# Patient Record
Sex: Male | Born: 1935 | Race: White | Hispanic: No | Marital: Married | Smoking: Former smoker
Health system: Southern US, Community
[De-identification: ages and names within clinical notes are randomized; demographics above are authoritative.]

## PROBLEM LIST (undated history)

## (undated) DIAGNOSIS — I639 Cerebral infarction, unspecified: Secondary | ICD-10-CM

## (undated) DIAGNOSIS — I509 Heart failure, unspecified: Secondary | ICD-10-CM

## (undated) DIAGNOSIS — M1990 Unspecified osteoarthritis, unspecified site: Secondary | ICD-10-CM

## (undated) DIAGNOSIS — I824Z9 Acute embolism and thrombosis of unspecified deep veins of unspecified distal lower extremity: Secondary | ICD-10-CM

## (undated) DIAGNOSIS — I10 Essential (primary) hypertension: Secondary | ICD-10-CM

## (undated) DIAGNOSIS — I209 Angina pectoris, unspecified: Secondary | ICD-10-CM

## (undated) DIAGNOSIS — I200 Unstable angina: Secondary | ICD-10-CM

## (undated) DIAGNOSIS — I2510 Atherosclerotic heart disease of native coronary artery without angina pectoris: Secondary | ICD-10-CM

## (undated) DIAGNOSIS — E785 Hyperlipidemia, unspecified: Secondary | ICD-10-CM

## (undated) HISTORY — PX: APPENDECTOMY: SHX54

## (undated) HISTORY — PX: TONSILLECTOMY: SUR1361

## (undated) HISTORY — DX: Acute embolism and thrombosis of unspecified deep veins of unspecified distal lower extremity: I82.4Z9

## (undated) HISTORY — PX: FOOT SURGERY: SHX648

## (undated) HISTORY — PX: SHOULDER SURGERY: SHX246

## (undated) HISTORY — DX: Unspecified osteoarthritis, unspecified site: M19.90

## (undated) HISTORY — DX: Hyperlipidemia, unspecified: E78.5

## (undated) HISTORY — DX: Cerebral infarction, unspecified: I63.9

---

## 2005-03-26 ENCOUNTER — Encounter (INDEPENDENT_AMBULATORY_CARE_PROVIDER_SITE_OTHER): Payer: Self-pay | Admitting: Internal Medicine

## 2006-01-14 ENCOUNTER — Encounter (INDEPENDENT_AMBULATORY_CARE_PROVIDER_SITE_OTHER): Payer: Self-pay | Admitting: Internal Medicine

## 2006-01-16 ENCOUNTER — Emergency Department (HOSPITAL_COMMUNITY): Admission: EM | Admit: 2006-01-16 | Discharge: 2006-01-16 | Payer: Self-pay | Admitting: Emergency Medicine

## 2006-02-09 ENCOUNTER — Ambulatory Visit: Payer: Self-pay | Admitting: Internal Medicine

## 2006-03-09 ENCOUNTER — Ambulatory Visit: Payer: Self-pay | Admitting: Internal Medicine

## 2006-03-12 ENCOUNTER — Encounter (INDEPENDENT_AMBULATORY_CARE_PROVIDER_SITE_OTHER): Payer: Self-pay | Admitting: Internal Medicine

## 2006-03-30 ENCOUNTER — Ambulatory Visit: Payer: Self-pay | Admitting: Internal Medicine

## 2006-04-13 ENCOUNTER — Ambulatory Visit: Payer: Self-pay | Admitting: Internal Medicine

## 2006-04-20 ENCOUNTER — Telehealth (INDEPENDENT_AMBULATORY_CARE_PROVIDER_SITE_OTHER): Payer: Self-pay | Admitting: Internal Medicine

## 2006-04-22 ENCOUNTER — Encounter (INDEPENDENT_AMBULATORY_CARE_PROVIDER_SITE_OTHER): Payer: Self-pay | Admitting: Internal Medicine

## 2006-05-12 ENCOUNTER — Ambulatory Visit: Payer: Self-pay | Admitting: Internal Medicine

## 2006-06-14 ENCOUNTER — Encounter (INDEPENDENT_AMBULATORY_CARE_PROVIDER_SITE_OTHER): Payer: Self-pay | Admitting: Internal Medicine

## 2006-08-02 ENCOUNTER — Ambulatory Visit: Payer: Self-pay | Admitting: Internal Medicine

## 2006-08-02 LAB — CONVERTED CEMR LAB: Hgb A1c MFr Bld: 7.1 %

## 2006-08-03 ENCOUNTER — Encounter (INDEPENDENT_AMBULATORY_CARE_PROVIDER_SITE_OTHER): Payer: Self-pay | Admitting: Internal Medicine

## 2006-08-03 LAB — CONVERTED CEMR LAB
AST: 23 units/L (ref 0–37)
Albumin: 4.6 g/dL (ref 3.5–5.2)
BUN: 25 mg/dL — ABNORMAL HIGH (ref 6–23)
CO2: 25 meq/L (ref 19–32)
Calcium: 9.7 mg/dL (ref 8.4–10.5)
Chloride: 105 meq/L (ref 96–112)
Cholesterol: 171 mg/dL (ref 0–200)
Creatinine, Ser: 0.97 mg/dL (ref 0.40–1.50)
HDL: 63 mg/dL (ref >39)
Lymphocytes Relative: 29 % (ref 12–46)
Lymphs Abs: 1.6 10*3/uL (ref 0.7–3.3)
MCV: 92 fL (ref 78.0–100.0)
Monocytes Relative: 9 % (ref 3–11)
Neutro Abs: 3 10*3/uL (ref 1.7–7.7)
Neutrophils Relative %: 56 % (ref 43–77)
Platelets: 221 10*3/uL (ref 150–400)
Potassium: 4.9 meq/L (ref 3.5–5.3)
RBC count: 4.4 10*6/uL
RBC: 4.4 M/uL (ref 4.22–5.81)
Total CHOL/HDL Ratio: 2.7
WBC: 5.4 10*3/uL (ref 4.0–10.5)

## 2006-08-04 ENCOUNTER — Encounter: Payer: Self-pay | Admitting: Internal Medicine

## 2006-08-04 DIAGNOSIS — E785 Hyperlipidemia, unspecified: Secondary | ICD-10-CM

## 2006-08-04 DIAGNOSIS — I10 Essential (primary) hypertension: Secondary | ICD-10-CM

## 2006-08-04 DIAGNOSIS — M1990 Unspecified osteoarthritis, unspecified site: Secondary | ICD-10-CM | POA: Insufficient documentation

## 2006-09-20 ENCOUNTER — Encounter (INDEPENDENT_AMBULATORY_CARE_PROVIDER_SITE_OTHER): Payer: Self-pay | Admitting: Internal Medicine

## 2006-10-01 ENCOUNTER — Encounter (INDEPENDENT_AMBULATORY_CARE_PROVIDER_SITE_OTHER): Payer: Self-pay | Admitting: Internal Medicine

## 2006-11-01 ENCOUNTER — Ambulatory Visit: Payer: Self-pay | Admitting: Internal Medicine

## 2006-11-01 DIAGNOSIS — F528 Other sexual dysfunction not due to a substance or known physiological condition: Secondary | ICD-10-CM | POA: Insufficient documentation

## 2006-11-03 ENCOUNTER — Encounter (INDEPENDENT_AMBULATORY_CARE_PROVIDER_SITE_OTHER): Payer: Self-pay | Admitting: Internal Medicine

## 2006-11-29 ENCOUNTER — Encounter (INDEPENDENT_AMBULATORY_CARE_PROVIDER_SITE_OTHER): Payer: Self-pay | Admitting: Internal Medicine

## 2006-12-15 ENCOUNTER — Encounter (INDEPENDENT_AMBULATORY_CARE_PROVIDER_SITE_OTHER): Payer: Self-pay | Admitting: Internal Medicine

## 2006-12-17 ENCOUNTER — Encounter (INDEPENDENT_AMBULATORY_CARE_PROVIDER_SITE_OTHER): Payer: Self-pay | Admitting: Internal Medicine

## 2006-12-31 ENCOUNTER — Encounter (INDEPENDENT_AMBULATORY_CARE_PROVIDER_SITE_OTHER): Payer: Self-pay | Admitting: Internal Medicine

## 2007-01-11 ENCOUNTER — Telehealth (INDEPENDENT_AMBULATORY_CARE_PROVIDER_SITE_OTHER): Payer: Self-pay | Admitting: *Deleted

## 2007-01-12 ENCOUNTER — Ambulatory Visit: Payer: Self-pay | Admitting: Internal Medicine

## 2007-01-12 DIAGNOSIS — N390 Urinary tract infection, site not specified: Secondary | ICD-10-CM | POA: Insufficient documentation

## 2007-01-12 DIAGNOSIS — R339 Retention of urine, unspecified: Secondary | ICD-10-CM | POA: Insufficient documentation

## 2007-01-12 LAB — CONVERTED CEMR LAB
Bilirubin Urine: NEGATIVE
Glucose, Urine, Semiquant: NEGATIVE
Specific Gravity, Urine: 1.015
pH: 5.5

## 2007-01-26 ENCOUNTER — Ambulatory Visit: Payer: Self-pay | Admitting: Internal Medicine

## 2007-01-26 DIAGNOSIS — N400 Enlarged prostate without lower urinary tract symptoms: Secondary | ICD-10-CM

## 2007-03-10 ENCOUNTER — Encounter (INDEPENDENT_AMBULATORY_CARE_PROVIDER_SITE_OTHER): Payer: Self-pay | Admitting: Internal Medicine

## 2007-03-28 ENCOUNTER — Encounter (INDEPENDENT_AMBULATORY_CARE_PROVIDER_SITE_OTHER): Payer: Self-pay | Admitting: Internal Medicine

## 2007-05-04 ENCOUNTER — Ambulatory Visit: Payer: Self-pay | Admitting: Internal Medicine

## 2007-05-04 LAB — CONVERTED CEMR LAB
Blood Glucose, Fingerstick: 123
Hgb A1c MFr Bld: 6.7 %

## 2007-05-05 LAB — CONVERTED CEMR LAB
AST: 22 units/L (ref 0–37)
Alkaline Phosphatase: 65 units/L (ref 39–117)
BUN: 21 mg/dL (ref 6–23)
Basophils Absolute: 0 10*3/uL (ref 0.0–0.1)
Basophils Relative: 1 % (ref 0–1)
Calcium: 9.5 mg/dL (ref 8.4–10.5)
Creatinine, Ser: 0.89 mg/dL (ref 0.40–1.50)
Eosinophils Absolute: 0.4 10*3/uL (ref 0.0–0.7)
Eosinophils Relative: 8 % — ABNORMAL HIGH (ref 0–5)
HDL: 62 mg/dL (ref >39)
Hemoglobin: 12.6 g/dL — ABNORMAL LOW (ref 13.0–17.0)
MCHC: 32.7 g/dL (ref 30.0–36.0)
MCV: 90.6 fL (ref 78.0–100.0)
Monocytes Absolute: 0.5 10*3/uL (ref 0.2–0.7)
Monocytes Relative: 12 % — ABNORMAL HIGH (ref 3–11)
PSA: 0.21 ng/mL (ref 0.10–4.00)
RBC: 4.25 M/uL (ref 4.22–5.81)
RDW: 13.6 % (ref 11.5–14.0)
Total Bilirubin: 0.5 mg/dL (ref 0.3–1.2)
Total CHOL/HDL Ratio: 2.6
VLDL: 33 mg/dL (ref 0–40)

## 2007-06-03 ENCOUNTER — Encounter (INDEPENDENT_AMBULATORY_CARE_PROVIDER_SITE_OTHER): Payer: Self-pay | Admitting: Internal Medicine

## 2007-08-03 ENCOUNTER — Ambulatory Visit: Payer: Self-pay | Admitting: Internal Medicine

## 2007-08-04 ENCOUNTER — Telehealth (INDEPENDENT_AMBULATORY_CARE_PROVIDER_SITE_OTHER): Payer: Self-pay | Admitting: *Deleted

## 2007-08-04 LAB — CONVERTED CEMR LAB
AST: 22 units/L (ref 0–37)
Alkaline Phosphatase: 72 units/L (ref 39–117)
BUN: 18 mg/dL (ref 6–23)
Basophils Absolute: 0 10*3/uL (ref 0.0–0.1)
Basophils Relative: 1 % (ref 0–1)
Creatinine, Ser: 0.91 mg/dL (ref 0.40–1.50)
Eosinophils Absolute: 0.4 10*3/uL (ref 0.0–0.7)
HDL: 59 mg/dL (ref >39)
LDL Cholesterol: 72 mg/dL (ref 0–99)
MCHC: 32.7 g/dL (ref 30.0–36.0)
MCV: 91.5 fL (ref 78.0–100.0)
Monocytes Absolute: 0.5 10*3/uL (ref 0.1–1.0)
Monocytes Relative: 9 % (ref 3–12)
Neutrophils Relative %: 60 % (ref 43–77)
Potassium: 4.9 meq/L (ref 3.5–5.3)
RBC: 4.48 M/uL (ref 4.22–5.81)
RDW: 13.1 % (ref 11.5–15.5)
Total CHOL/HDL Ratio: 2.8

## 2007-11-01 ENCOUNTER — Ambulatory Visit: Payer: Self-pay | Admitting: Internal Medicine

## 2007-11-01 LAB — CONVERTED CEMR LAB: Blood Glucose, Fingerstick: 156

## 2007-12-23 ENCOUNTER — Ambulatory Visit: Payer: Self-pay | Admitting: Internal Medicine

## 2007-12-23 DIAGNOSIS — E1169 Type 2 diabetes mellitus with other specified complication: Secondary | ICD-10-CM | POA: Insufficient documentation

## 2008-01-18 ENCOUNTER — Encounter (INDEPENDENT_AMBULATORY_CARE_PROVIDER_SITE_OTHER): Payer: Self-pay | Admitting: Internal Medicine

## 2008-01-31 ENCOUNTER — Ambulatory Visit: Payer: Self-pay | Admitting: Internal Medicine

## 2008-01-31 DIAGNOSIS — E1149 Type 2 diabetes mellitus with other diabetic neurological complication: Secondary | ICD-10-CM

## 2008-02-02 LAB — CONVERTED CEMR LAB
ALT: 19 units/L (ref 0–53)
Alkaline Phosphatase: 62 units/L (ref 39–117)
Basophils Absolute: 0 10*3/uL (ref 0.0–0.1)
Eosinophils Absolute: 0.3 10*3/uL (ref 0.0–0.7)
Eosinophils Relative: 5 % (ref 0–5)
Glucose, Bld: 99 mg/dL (ref 70–99)
HCT: 38.5 % — ABNORMAL LOW (ref 39.0–52.0)
LDL Cholesterol: 79 mg/dL (ref 0–99)
MCHC: 32.7 g/dL (ref 30.0–36.0)
MCV: 89.5 fL (ref 78.0–100.0)
Platelets: 218 10*3/uL (ref 150–400)
RDW: 13.2 % (ref 11.5–15.5)
Sodium: 139 meq/L (ref 135–145)
Total Bilirubin: 0.5 mg/dL (ref 0.3–1.2)
Total Protein: 7.3 g/dL (ref 6.0–8.3)
Triglycerides: 149 mg/dL (ref <150)
VLDL: 30 mg/dL (ref 0–40)

## 2008-03-16 ENCOUNTER — Encounter (INDEPENDENT_AMBULATORY_CARE_PROVIDER_SITE_OTHER): Payer: Self-pay | Admitting: Internal Medicine

## 2008-04-03 ENCOUNTER — Ambulatory Visit (HOSPITAL_COMMUNITY): Admission: RE | Admit: 2008-04-03 | Discharge: 2008-04-03 | Payer: Self-pay | Admitting: Ophthalmology

## 2008-04-17 ENCOUNTER — Ambulatory Visit (HOSPITAL_COMMUNITY): Admission: RE | Admit: 2008-04-17 | Discharge: 2008-04-17 | Payer: Self-pay | Admitting: Ophthalmology

## 2008-05-01 ENCOUNTER — Ambulatory Visit: Payer: Self-pay | Admitting: Internal Medicine

## 2008-05-03 LAB — CONVERTED CEMR LAB: Microalb, Ur: 0.43 mg/dL (ref 0.00–1.89)

## 2008-05-04 ENCOUNTER — Encounter (INDEPENDENT_AMBULATORY_CARE_PROVIDER_SITE_OTHER): Payer: Self-pay | Admitting: *Deleted

## 2008-06-18 ENCOUNTER — Encounter (INDEPENDENT_AMBULATORY_CARE_PROVIDER_SITE_OTHER): Payer: Self-pay | Admitting: Internal Medicine

## 2008-07-05 ENCOUNTER — Telehealth (INDEPENDENT_AMBULATORY_CARE_PROVIDER_SITE_OTHER): Payer: Self-pay | Admitting: *Deleted

## 2008-07-31 ENCOUNTER — Ambulatory Visit: Payer: Self-pay | Admitting: Internal Medicine

## 2008-07-31 DIAGNOSIS — M25569 Pain in unspecified knee: Secondary | ICD-10-CM | POA: Insufficient documentation

## 2008-07-31 LAB — CONVERTED CEMR LAB: Hgb A1c MFr Bld: 7.5 %

## 2008-08-01 ENCOUNTER — Telehealth (INDEPENDENT_AMBULATORY_CARE_PROVIDER_SITE_OTHER): Payer: Self-pay | Admitting: *Deleted

## 2008-08-01 LAB — CONVERTED CEMR LAB
BUN: 25 mg/dL — ABNORMAL HIGH (ref 6–23)
CO2: 24 meq/L (ref 19–32)
Calcium: 9.8 mg/dL (ref 8.4–10.5)
Cholesterol: 147 mg/dL (ref 0–200)
Creatinine, Ser: 0.86 mg/dL (ref 0.40–1.50)
Eosinophils Relative: 6 % — ABNORMAL HIGH (ref 0–5)
Glucose, Bld: 123 mg/dL — ABNORMAL HIGH (ref 70–99)
HCT: 38.5 % — ABNORMAL LOW (ref 39.0–52.0)
Hemoglobin: 12.5 g/dL — ABNORMAL LOW (ref 13.0–17.0)
Lymphocytes Relative: 25 % (ref 12–46)
Lymphs Abs: 1.4 10*3/uL (ref 0.7–4.0)
Monocytes Absolute: 0.5 10*3/uL (ref 0.1–1.0)
Monocytes Relative: 10 % (ref 3–12)
RBC: 4.3 M/uL (ref 4.22–5.81)
RDW: 13 % (ref 11.5–15.5)
Total Bilirubin: 0.5 mg/dL (ref 0.3–1.2)
Total CHOL/HDL Ratio: 3
Triglycerides: 184 mg/dL — ABNORMAL HIGH (ref <150)
VLDL: 37 mg/dL (ref 0–40)
WBC: 5.7 10*3/uL (ref 4.0–10.5)

## 2008-08-02 ENCOUNTER — Encounter (INDEPENDENT_AMBULATORY_CARE_PROVIDER_SITE_OTHER): Payer: Self-pay | Admitting: Internal Medicine

## 2008-08-15 ENCOUNTER — Telehealth (INDEPENDENT_AMBULATORY_CARE_PROVIDER_SITE_OTHER): Payer: Self-pay | Admitting: *Deleted

## 2008-08-20 ENCOUNTER — Encounter (INDEPENDENT_AMBULATORY_CARE_PROVIDER_SITE_OTHER): Payer: Self-pay | Admitting: Internal Medicine

## 2008-08-29 ENCOUNTER — Encounter (INDEPENDENT_AMBULATORY_CARE_PROVIDER_SITE_OTHER): Payer: Self-pay | Admitting: Internal Medicine

## 2008-09-04 ENCOUNTER — Ambulatory Visit (HOSPITAL_COMMUNITY): Admission: RE | Admit: 2008-09-04 | Discharge: 2008-09-04 | Payer: Self-pay | Admitting: Internal Medicine

## 2008-09-06 ENCOUNTER — Ambulatory Visit: Payer: Self-pay | Admitting: Internal Medicine

## 2008-10-30 ENCOUNTER — Ambulatory Visit: Payer: Self-pay | Admitting: Internal Medicine

## 2008-10-31 ENCOUNTER — Encounter (INDEPENDENT_AMBULATORY_CARE_PROVIDER_SITE_OTHER): Payer: Self-pay | Admitting: Internal Medicine

## 2008-11-28 ENCOUNTER — Encounter (INDEPENDENT_AMBULATORY_CARE_PROVIDER_SITE_OTHER): Payer: Self-pay | Admitting: Internal Medicine

## 2009-01-08 ENCOUNTER — Telehealth (INDEPENDENT_AMBULATORY_CARE_PROVIDER_SITE_OTHER): Payer: Self-pay | Admitting: Internal Medicine

## 2009-01-30 ENCOUNTER — Ambulatory Visit: Payer: Self-pay | Admitting: Internal Medicine

## 2009-01-31 ENCOUNTER — Encounter (INDEPENDENT_AMBULATORY_CARE_PROVIDER_SITE_OTHER): Payer: Self-pay | Admitting: Internal Medicine

## 2009-01-31 DIAGNOSIS — E875 Hyperkalemia: Secondary | ICD-10-CM | POA: Insufficient documentation

## 2009-01-31 LAB — CONVERTED CEMR LAB
ALT: 16 units/L (ref 0–53)
Basophils Relative: 1 % (ref 0–1)
CO2: 21 meq/L (ref 19–32)
Calcium: 10 mg/dL (ref 8.4–10.5)
Chloride: 106 meq/L (ref 96–112)
Cholesterol: 166 mg/dL (ref 0–200)
Eosinophils Absolute: 0.3 10*3/uL (ref 0.0–0.7)
Glucose, Bld: 118 mg/dL — ABNORMAL HIGH (ref 70–99)
Hemoglobin: 13 g/dL (ref 13.0–17.0)
Lymphs Abs: 1.3 10*3/uL (ref 0.7–4.0)
MCHC: 34.5 g/dL (ref 30.0–36.0)
MCV: 88.5 fL (ref 78.0–100.0)
Monocytes Absolute: 0.6 10*3/uL (ref 0.1–1.0)
Monocytes Relative: 10 % (ref 3–12)
Neutro Abs: 3.6 10*3/uL (ref 1.7–7.7)
RBC: 4.26 M/uL (ref 4.22–5.81)
Sodium: 142 meq/L (ref 135–145)
Total Bilirubin: 0.5 mg/dL (ref 0.3–1.2)
Total Protein: 7.4 g/dL (ref 6.0–8.3)
Triglycerides: 171 mg/dL — ABNORMAL HIGH (ref <150)
VLDL: 34 mg/dL (ref 0–40)
WBC: 5.9 10*3/uL (ref 4.0–10.5)

## 2009-02-13 ENCOUNTER — Encounter (INDEPENDENT_AMBULATORY_CARE_PROVIDER_SITE_OTHER): Payer: Self-pay | Admitting: Internal Medicine

## 2009-02-15 ENCOUNTER — Encounter (INDEPENDENT_AMBULATORY_CARE_PROVIDER_SITE_OTHER): Payer: Self-pay | Admitting: Internal Medicine

## 2009-03-13 LAB — CONVERTED CEMR LAB: Potassium: 5.1 meq/L (ref 3.5–5.3)

## 2009-04-05 ENCOUNTER — Encounter (INDEPENDENT_AMBULATORY_CARE_PROVIDER_SITE_OTHER): Payer: Self-pay | Admitting: Internal Medicine

## 2009-07-27 HISTORY — PX: COLONOSCOPY: SHX174

## 2009-11-04 ENCOUNTER — Encounter: Payer: Self-pay | Admitting: Gastroenterology

## 2009-11-04 ENCOUNTER — Telehealth (INDEPENDENT_AMBULATORY_CARE_PROVIDER_SITE_OTHER): Payer: Self-pay

## 2009-11-20 ENCOUNTER — Ambulatory Visit: Payer: Self-pay | Admitting: Internal Medicine

## 2009-11-20 ENCOUNTER — Ambulatory Visit (HOSPITAL_COMMUNITY): Admission: RE | Admit: 2009-11-20 | Discharge: 2009-11-20 | Payer: Self-pay | Admitting: Internal Medicine

## 2009-11-21 ENCOUNTER — Encounter: Payer: Self-pay | Admitting: Internal Medicine

## 2010-08-24 LAB — CONVERTED CEMR LAB: WBC, blood: 5.4 10*3/uL

## 2010-08-26 NOTE — Progress Notes (Signed)
Summary: phone note/adjusting diabetic meds for TCS  Phone Note Outgoing Call   Call placed by: Kathy Wares Call placed to: Patient Summary of Call: I called pt to review instructions for TCS. He had failed to tell me he is taking Lantus 28 units at bedtime. (How do you want  to adjust that). His Metformin 1000 mg two times a day and Januvia 100 mg Am, you are holding evening dose of the  Metformin and no Diabetic meds morning of procedure). Please advise on the Lantus! Initial call taken by: Waldon Merl LPN,  April 11, [id number] 10:34 AM     Appended Document: phone note/adjusting diabetic meds for TCS decrease lantus to 18 units night before  Appended Document: phone note/adjusting diabetic meds for TCS Pt informed.

## 2010-08-26 NOTE — Letter (Signed)
Summary: Patient Notice, Colon Biopsy Results  [facility]  [address]   [place], [place] [address]   Phone: [phone]  Fax: [phone]       November 21, 2009   FATHI DESCHEPPER [place],   [id number] 15-Apr-1934    Dear Mr. Celaya,  I am pleased to inform you that the biopsies taken during your recent colonoscopy did not show any evidence of cancer upon pathologic examination.  Additional information/recommendations:  You should have a repeat colonoscopy examination  in 5 years.  Please call us if you are having persistent problems or have questions about your condition that have not been fully answered at this time.  Sincerely,    R. Garfield Cornea MD, [facility] Ph: [phone]    Fax: [phone]   Appended Document: Patient Notice, Colon Biopsy Results letter mailed to pt  Appended Document: Patient Notice, Colon Biopsy Results Reminder placed in [place].

## 2010-08-26 NOTE — Letter (Signed)
Summary: TRIAGE TCS  TRIAGE TCS   Imported By: Zeb Comfort 11/04/2009 09:31:37  _____________________________________________________________________  External Attachment:    Type:   Image     Comment:   External Document

## 2010-10-07 ENCOUNTER — Ambulatory Visit (HOSPITAL_COMMUNITY)
Admission: RE | Admit: 2010-10-07 | Discharge: 2010-10-07 | Disposition: A | Discharge status: Home / Self Care | Payer: Medicare Other | Source: Ambulatory Visit | Attending: Ophthalmology | Admitting: Ophthalmology

## 2010-10-07 DIAGNOSIS — E119 Type 2 diabetes mellitus without complications: Secondary | ICD-10-CM | POA: Insufficient documentation

## 2010-10-07 DIAGNOSIS — H26499 Other secondary cataract, unspecified eye: Secondary | ICD-10-CM | POA: Insufficient documentation

## 2010-10-07 DIAGNOSIS — Z794 Long term (current) use of insulin: Secondary | ICD-10-CM | POA: Insufficient documentation

## 2010-10-07 DIAGNOSIS — Z79899 Other long term (current) drug therapy: Secondary | ICD-10-CM | POA: Insufficient documentation

## 2010-10-07 DIAGNOSIS — I10 Essential (primary) hypertension: Secondary | ICD-10-CM | POA: Insufficient documentation

## 2010-10-14 LAB — GLUCOSE, CAPILLARY: Glucose-Capillary: 88 mg/dL (ref 70–99)

## 2010-10-21 ENCOUNTER — Ambulatory Visit (HOSPITAL_COMMUNITY)
Admission: RE | Admit: 2010-10-21 | Discharge: 2010-10-21 | Disposition: A | Discharge status: Home / Self Care | Payer: Medicare Other | Source: Ambulatory Visit | Attending: Ophthalmology | Admitting: Ophthalmology

## 2010-10-21 DIAGNOSIS — E119 Type 2 diabetes mellitus without complications: Secondary | ICD-10-CM | POA: Insufficient documentation

## 2010-10-21 DIAGNOSIS — I10 Essential (primary) hypertension: Secondary | ICD-10-CM | POA: Insufficient documentation

## 2010-10-21 DIAGNOSIS — H26499 Other secondary cataract, unspecified eye: Secondary | ICD-10-CM | POA: Insufficient documentation

## 2010-10-21 DIAGNOSIS — Z79899 Other long term (current) drug therapy: Secondary | ICD-10-CM | POA: Insufficient documentation

## 2011-04-27 LAB — GLUCOSE, CAPILLARY: Glucose-Capillary: 165 — ABNORMAL HIGH

## 2011-04-29 LAB — BASIC METABOLIC PANEL
Calcium: 9.5
GFR calc Af Amer: >60
GFR calc non Af Amer: >60
Glucose, Bld: 158 — ABNORMAL HIGH
Potassium: 5
Sodium: 138

## 2011-04-29 LAB — HEMOGLOBIN AND HEMATOCRIT, BLOOD
HCT: 33.9 — ABNORMAL LOW
Hemoglobin: 11.6 — ABNORMAL LOW

## 2011-04-29 LAB — GLUCOSE, CAPILLARY: Glucose-Capillary: 134 — ABNORMAL HIGH

## 2011-08-20 DIAGNOSIS — E119 Type 2 diabetes mellitus without complications: Secondary | ICD-10-CM | POA: Diagnosis not present

## 2011-08-20 DIAGNOSIS — E785 Hyperlipidemia, unspecified: Secondary | ICD-10-CM | POA: Diagnosis not present

## 2011-08-20 DIAGNOSIS — Z0000 Encounter for general adult medical examination without abnormal findings: Secondary | ICD-10-CM | POA: Diagnosis not present

## 2011-08-25 DIAGNOSIS — E782 Mixed hyperlipidemia: Secondary | ICD-10-CM | POA: Diagnosis not present

## 2011-08-25 DIAGNOSIS — Z79899 Other long term (current) drug therapy: Secondary | ICD-10-CM | POA: Diagnosis not present

## 2011-08-25 DIAGNOSIS — Z125 Encounter for screening for malignant neoplasm of prostate: Secondary | ICD-10-CM | POA: Diagnosis not present

## 2011-08-25 DIAGNOSIS — E119 Type 2 diabetes mellitus without complications: Secondary | ICD-10-CM | POA: Diagnosis not present

## 2011-10-20 DIAGNOSIS — E119 Type 2 diabetes mellitus without complications: Secondary | ICD-10-CM | POA: Diagnosis not present

## 2011-10-20 DIAGNOSIS — H40019 Open angle with borderline findings, low risk, unspecified eye: Secondary | ICD-10-CM | POA: Diagnosis not present

## 2011-10-20 DIAGNOSIS — Z961 Presence of intraocular lens: Secondary | ICD-10-CM | POA: Diagnosis not present

## 2011-11-05 DIAGNOSIS — H4011X0 Primary open-angle glaucoma, stage unspecified: Secondary | ICD-10-CM | POA: Diagnosis not present

## 2011-11-05 DIAGNOSIS — H409 Unspecified glaucoma: Secondary | ICD-10-CM | POA: Diagnosis not present

## 2011-11-17 DIAGNOSIS — M7530 Calcific tendinitis of unspecified shoulder: Secondary | ICD-10-CM | POA: Diagnosis not present

## 2011-12-15 DIAGNOSIS — H4011X0 Primary open-angle glaucoma, stage unspecified: Secondary | ICD-10-CM | POA: Diagnosis not present

## 2011-12-15 DIAGNOSIS — H409 Unspecified glaucoma: Secondary | ICD-10-CM | POA: Diagnosis not present

## 2011-12-16 ENCOUNTER — Ambulatory Visit (INDEPENDENT_AMBULATORY_CARE_PROVIDER_SITE_OTHER): Payer: Medicare Other

## 2011-12-16 ENCOUNTER — Ambulatory Visit (INDEPENDENT_AMBULATORY_CARE_PROVIDER_SITE_OTHER): Payer: Medicare Other | Admitting: Orthopedic Surgery

## 2011-12-16 ENCOUNTER — Encounter: Payer: Self-pay | Admitting: Orthopedic Surgery

## 2011-12-16 VITALS — BP 110/58 | Ht 71.5 in | Wt 228.0 lb

## 2011-12-16 DIAGNOSIS — M67919 Unspecified disorder of synovium and tendon, unspecified shoulder: Secondary | ICD-10-CM | POA: Diagnosis not present

## 2011-12-16 DIAGNOSIS — M25519 Pain in unspecified shoulder: Secondary | ICD-10-CM

## 2011-12-16 DIAGNOSIS — M25511 Pain in right shoulder: Secondary | ICD-10-CM

## 2011-12-16 DIAGNOSIS — M75101 Unspecified rotator cuff tear or rupture of right shoulder, not specified as traumatic: Secondary | ICD-10-CM | POA: Insufficient documentation

## 2011-12-16 DIAGNOSIS — M719 Bursopathy, unspecified: Secondary | ICD-10-CM | POA: Diagnosis not present

## 2011-12-16 NOTE — Progress Notes (Signed)
  Subjective:    Jerry Burns is a 78 y.o. male who presents with right shoulder pain x 6 mos. No trauma. Gradual onset. No treatment. Dull pain. 4/10. Pain comes and goes. Increased with activity. Catching sensation.  ROS: Joint pain, seasonal allergy. Otherwise negative   Vital signs are stable as recorded BP 110/58  Ht 5' 11.5" (1.816 m)  Wt 228 lb (103.42 kg)  BMI 31.36 kg/m2  General appearance is normal  The patient is alert and oriented x3  The patient's mood and affect are normal  Gait assessment: normal  The cardiovascular exam reveals normal pulses and temperature without edema swelling.  The lymphatic system is negative for palpable lymph nodes  The sensory exam is normal.  There are no pathologic reflexes.  Balance is normal.   Exam of the right shoulder:  Inspection no swelling, tender anterior joint line  Range of motion 150 FE/ IR = T12  Stability NORMAL  Strength NORMAL  Skin NORMAL   LEFT UE Upper extremity exam Inspection and palpation revealed no abnormalities in the upper extremities.  Range of motion is full without contracture. Motor exam is normal with grade 5 strength. The joints are fully reduced without subluxation. There is no atrophy or tremor and muscle tone is normal.  All joints are stable.  Lower extremity exam  Ambulation is normal.  Inspection and palpation revealed no tenderness or abnormality in alignment in the lower extremities. Range of motion is full.  Strength is grade 5.   all joints are stable.  XRAYS MILD HUMERAL HEAD MIGRATION   Assessment:    Right ROTATOR CUFF SYNDROME     Plan:    INJECTION AND HOME EXERCISES    Shoulder Injection Procedure Note   Pre-operative Diagnosis: right  RC Syndrome  Post-operative Diagnosis: same  Indications: pain   Anesthesia: ethyl chloride   Procedure Details   Verbal consent was obtained for the procedure. The shoulder was prepped withalcohol and the skin was  anesthetized. A 20 gauge needle was advanced into the subacromial space through posterior approach without difficulty  The space was then injected with 3 ml 1% lidocaine and 1 ml of depomedrol. The injection site was cleansed with isopropyl alcohol and a dressing was applied.  Complications:  None; patient tolerated the procedure well.  Shoulder Injection Procedure Note

## 2011-12-16 NOTE — Patient Instructions (Addendum)
You have received a steroid shot. 15% of patients experience increased pain at the injection site with in the next 24 hours. This is best treated with ice and tylenol extra strength 2 tabs every 8 hours. If you are still having pain please call the office.   Discharge Instructions Browse by Alphabet A B C D E F G H I J K L M N O P Q R S T U V W X Y Z  Browse by Category All Documents Allergy and Immunology Anesthesiology Bariatrics Bioterrorism Cardiology Critical Care Dentistry Dermatology Diabetes Dietary Easy-to-Read Emergency Medicine Endocrinology ENT Family Medicine Forms Gastroenterology Geriatrics Hematology Home Health Care Infectious Disease Internal Medicine Labs and Tests Neonatology Nephrology Neurology Obstetrics and Gynecology Oncology Ophthalmology Orthopedics Pediatrics Pharmacology Physical Medicine and Rehabilitation Podiatry Preventive Medicine Procedures Psychiatry Pulmonary Medicine Radiology Rheumatology Surgery Urology Drug Information Sheets All Drug Information Sheets  Browse by Alphabet A B C D E F G H I J K L M N O P Q R S T U V W X Y Z Impingement Syndrome, Rotator Cuff, Bursitis with Rehab Impingement syndrome is a condition that involves inflammation of the tendons of the rotator cuff and the subacromial bursa, that causes pain in the shoulder. The rotator cuff consists of four tendons and muscles that control much of the shoulder and upper arm function. The subacromial bursa is a fluid filled sac that helps reduce friction between the rotator cuff and one of the bones of the shoulder (acromion). Impingement syndrome is usually an overuse injury that causes swelling of the bursa (bursitis), swelling of the tendon (tendonitis), and/or a tear of the tendon (strain). Strains are classified into three categories. Grade 1 strains cause pain, but the tendon is not lengthened. Grade 2 strains include a lengthened ligament, due to the  ligament being stretched or partially ruptured. With grade 2 strains there is still function, although the function may be decreased. Grade 3 strains include a complete tear of the tendon or muscle, and function is usually impaired. SYMPTOMS    Pain around the shoulder, often at the outer portion of the upper arm.   Pain that gets worse with shoulder function, especially when reaching overhead or lifting.   Sometimes, aching when not using the arm.   Pain that wakes you up at night.   Sometimes, tenderness, swelling, warmth, or redness over the affected area.   Loss of strength.   Limited motion of the shoulder, especially reaching behind the back (to the back pocket or to unhook bra) or across your body.   Crackling sound (crepitation) when moving the arm.   Biceps tendon pain and inflammation (in the front of the shoulder). Worse when bending the elbow or lifting.  CAUSES   Impingement syndrome is often an overuse injury, in which chronic (repetitive) motions cause the tendons or bursa to become inflamed. A strain occurs when a force is paced on the tendon or muscle that is greater than it can withstand. Common mechanisms of injury include: Stress from sudden increase in duration, frequency, or intensity of training.  Direct hit (trauma) to the shoulder.   Aging, erosion of the tendon with normal use.   Bony bump on shoulder (acromial spur).  RISK INCREASES WITH:  Contact sports (football, wrestling, boxing).   Throwing sports (baseball, tennis, volleyball).   Weightlifting and bodybuilding.   Heavy labor.   Previous injury to the rotator cuff, including impingement.   Poor shoulder strength and flexibility.  Failure to warm up properly before activity.   Inadequate protective equipment.   Old age.   Bony bump on shoulder (acromial spur).  PREVENTION    Warm up and stretch properly before activity.   Allow for adequate recovery between workouts.   Maintain  physical fitness:   Strength, flexibility, and endurance.   Cardiovascular fitness.   Learn and use proper exercise technique.  PROGNOSIS   If treated properly, impingement syndrome usually goes away within 6 weeks. Sometimes surgery is required.   RELATED COMPLICATIONS    Longer healing time if not properly treated, or if not given enough time to heal.   Recurring symptoms, that result in a chronic condition.   Shoulder stiffness, frozen shoulder, or loss of motion.   Rotator cuff tendon tear.   Recurring symptoms, especially if activity is resumed too soon, with overuse, with a direct blow, or when using poor technique.  TREATMENT   Treatment first involves the use of ice and medicine, to reduce pain and inflammation. The use of strengthening and stretching exercises may help reduce pain with activity. These exercises may be performed at home or with a therapist. If non-surgical treatment is unsuccessful after more than 6 months, surgery may be advised. After surgery and rehabilitation, activity is usually possible in 3 months.   MEDICATION  If pain medicine is needed, nonsteroidal anti-inflammatory medicines (aspirin and ibuprofen), or other minor pain relievers (acetaminophen), are often advised.   Do not take pain medicine for 7 days before surgery.   Prescription pain relievers may be given, if your caregiver thinks they are needed. Use only as directed and only as much as you need.   Corticosteroid injections may be given by your caregiver. These injections should be reserved for the most serious cases, because they may only be given a certain number of times.  HEAT AND COLD  Cold treatment (icing) should be applied for 10 to 15 minutes every 2 to 3 hours for inflammation and pain, and immediately after activity that aggravates your symptoms. Use ice packs or an ice massage.   Heat treatment may be used before performing stretching and strengthening activities prescribed by  your caregiver, physical therapist, or athletic trainer. Use a heat pack or a warm water soak.    . Do these exercises for the next 8 weeks

## 2012-02-15 DIAGNOSIS — E782 Mixed hyperlipidemia: Secondary | ICD-10-CM | POA: Diagnosis not present

## 2012-02-15 DIAGNOSIS — Z79899 Other long term (current) drug therapy: Secondary | ICD-10-CM | POA: Diagnosis not present

## 2012-02-15 DIAGNOSIS — E119 Type 2 diabetes mellitus without complications: Secondary | ICD-10-CM | POA: Diagnosis not present

## 2012-03-03 DIAGNOSIS — E785 Hyperlipidemia, unspecified: Secondary | ICD-10-CM | POA: Diagnosis not present

## 2012-03-03 DIAGNOSIS — I10 Essential (primary) hypertension: Secondary | ICD-10-CM | POA: Diagnosis not present

## 2012-03-15 DIAGNOSIS — H409 Unspecified glaucoma: Secondary | ICD-10-CM | POA: Diagnosis not present

## 2012-03-15 DIAGNOSIS — H4011X0 Primary open-angle glaucoma, stage unspecified: Secondary | ICD-10-CM | POA: Diagnosis not present

## 2012-04-12 DIAGNOSIS — H11049 Peripheral pterygium, stationary, unspecified eye: Secondary | ICD-10-CM | POA: Diagnosis not present

## 2012-04-26 DIAGNOSIS — H11049 Peripheral pterygium, stationary, unspecified eye: Secondary | ICD-10-CM | POA: Diagnosis not present

## 2012-05-02 DIAGNOSIS — H539 Unspecified visual disturbance: Secondary | ICD-10-CM | POA: Diagnosis not present

## 2012-05-02 DIAGNOSIS — Z23 Encounter for immunization: Secondary | ICD-10-CM | POA: Diagnosis not present

## 2012-05-02 DIAGNOSIS — I69998 Other sequelae following unspecified cerebrovascular disease: Secondary | ICD-10-CM | POA: Diagnosis not present

## 2012-05-03 DIAGNOSIS — R443 Hallucinations, unspecified: Secondary | ICD-10-CM | POA: Diagnosis not present

## 2012-05-03 DIAGNOSIS — H43819 Vitreous degeneration, unspecified eye: Secondary | ICD-10-CM | POA: Diagnosis not present

## 2012-05-04 ENCOUNTER — Other Ambulatory Visit: Payer: Self-pay | Admitting: Family Medicine

## 2012-05-04 DIAGNOSIS — H547 Unspecified visual loss: Secondary | ICD-10-CM

## 2012-05-05 ENCOUNTER — Ambulatory Visit (HOSPITAL_COMMUNITY)
Admission: RE | Admit: 2012-05-05 | Discharge: 2012-05-05 | Disposition: A | Discharge status: Home / Self Care | Payer: Medicare Other | Source: Ambulatory Visit | Attending: Family Medicine | Admitting: Family Medicine

## 2012-05-05 DIAGNOSIS — I6350 Cerebral infarction due to unspecified occlusion or stenosis of unspecified cerebral artery: Secondary | ICD-10-CM | POA: Diagnosis not present

## 2012-05-05 DIAGNOSIS — R269 Unspecified abnormalities of gait and mobility: Secondary | ICD-10-CM | POA: Diagnosis not present

## 2012-05-05 DIAGNOSIS — H547 Unspecified visual loss: Secondary | ICD-10-CM | POA: Diagnosis not present

## 2012-05-06 DIAGNOSIS — I6350 Cerebral infarction due to unspecified occlusion or stenosis of unspecified cerebral artery: Secondary | ICD-10-CM | POA: Diagnosis not present

## 2012-05-11 ENCOUNTER — Other Ambulatory Visit: Payer: Self-pay | Admitting: Family Medicine

## 2012-05-11 DIAGNOSIS — I639 Cerebral infarction, unspecified: Secondary | ICD-10-CM

## 2012-05-17 ENCOUNTER — Ambulatory Visit (HOSPITAL_COMMUNITY)
Admission: RE | Admit: 2012-05-17 | Discharge: 2012-05-17 | Disposition: A | Discharge status: Home / Self Care | Payer: Medicare Other | Source: Ambulatory Visit | Attending: Family Medicine | Admitting: Family Medicine

## 2012-05-17 DIAGNOSIS — I10 Essential (primary) hypertension: Secondary | ICD-10-CM | POA: Insufficient documentation

## 2012-05-17 DIAGNOSIS — E119 Type 2 diabetes mellitus without complications: Secondary | ICD-10-CM | POA: Insufficient documentation

## 2012-05-17 DIAGNOSIS — E785 Hyperlipidemia, unspecified: Secondary | ICD-10-CM | POA: Diagnosis not present

## 2012-05-17 DIAGNOSIS — I658 Occlusion and stenosis of other precerebral arteries: Secondary | ICD-10-CM | POA: Diagnosis not present

## 2012-05-17 DIAGNOSIS — I639 Cerebral infarction, unspecified: Secondary | ICD-10-CM

## 2012-05-17 DIAGNOSIS — H539 Unspecified visual disturbance: Secondary | ICD-10-CM | POA: Diagnosis not present

## 2012-05-17 DIAGNOSIS — H538 Other visual disturbances: Secondary | ICD-10-CM | POA: Insufficient documentation

## 2012-05-17 DIAGNOSIS — R930 Abnormal findings on diagnostic imaging of skull and head, not elsewhere classified: Secondary | ICD-10-CM | POA: Diagnosis not present

## 2012-05-27 DIAGNOSIS — E119 Type 2 diabetes mellitus without complications: Secondary | ICD-10-CM | POA: Diagnosis not present

## 2012-05-27 DIAGNOSIS — Z79899 Other long term (current) drug therapy: Secondary | ICD-10-CM | POA: Diagnosis not present

## 2012-05-27 DIAGNOSIS — E782 Mixed hyperlipidemia: Secondary | ICD-10-CM | POA: Diagnosis not present

## 2012-06-09 DIAGNOSIS — I6340 Cerebral infarction due to embolism of unspecified cerebral artery: Secondary | ICD-10-CM | POA: Diagnosis not present

## 2012-06-13 ENCOUNTER — Other Ambulatory Visit: Payer: Self-pay | Admitting: Neurology

## 2012-06-13 DIAGNOSIS — I6340 Cerebral infarction due to embolism of unspecified cerebral artery: Secondary | ICD-10-CM

## 2012-06-13 DIAGNOSIS — E119 Type 2 diabetes mellitus without complications: Secondary | ICD-10-CM | POA: Diagnosis not present

## 2012-06-13 DIAGNOSIS — E785 Hyperlipidemia, unspecified: Secondary | ICD-10-CM | POA: Diagnosis not present

## 2012-06-14 DIAGNOSIS — H4011X0 Primary open-angle glaucoma, stage unspecified: Secondary | ICD-10-CM | POA: Diagnosis not present

## 2012-06-14 DIAGNOSIS — H409 Unspecified glaucoma: Secondary | ICD-10-CM | POA: Diagnosis not present

## 2012-06-16 DIAGNOSIS — G459 Transient cerebral ischemic attack, unspecified: Secondary | ICD-10-CM | POA: Diagnosis not present

## 2012-06-21 ENCOUNTER — Other Ambulatory Visit: Payer: Medicare Other

## 2012-06-29 ENCOUNTER — Ambulatory Visit
Admission: RE | Admit: 2012-06-29 | Discharge: 2012-06-29 | Disposition: A | Discharge status: Home / Self Care | Payer: Medicare Other | Source: Ambulatory Visit | Attending: Neurology | Admitting: Neurology

## 2012-06-29 DIAGNOSIS — I6340 Cerebral infarction due to embolism of unspecified cerebral artery: Secondary | ICD-10-CM

## 2012-06-29 MED ORDER — GADOBENATE DIMEGLUMINE 529 MG/ML IV SOLN
20.0000 mL | Freq: Once | INTRAVENOUS | Status: AC | PRN
  Administered 2012-06-29: 20 mL via INTRAVENOUS

## 2012-07-05 DIAGNOSIS — E119 Type 2 diabetes mellitus without complications: Secondary | ICD-10-CM | POA: Diagnosis not present

## 2012-07-05 DIAGNOSIS — H43819 Vitreous degeneration, unspecified eye: Secondary | ICD-10-CM | POA: Diagnosis not present

## 2012-07-05 DIAGNOSIS — R443 Hallucinations, unspecified: Secondary | ICD-10-CM | POA: Diagnosis not present

## 2012-08-03 DIAGNOSIS — R443 Hallucinations, unspecified: Secondary | ICD-10-CM | POA: Diagnosis not present

## 2012-09-12 DIAGNOSIS — Z79899 Other long term (current) drug therapy: Secondary | ICD-10-CM | POA: Diagnosis not present

## 2012-09-12 DIAGNOSIS — Z125 Encounter for screening for malignant neoplasm of prostate: Secondary | ICD-10-CM | POA: Diagnosis not present

## 2012-09-12 DIAGNOSIS — E119 Type 2 diabetes mellitus without complications: Secondary | ICD-10-CM | POA: Diagnosis not present

## 2012-09-12 DIAGNOSIS — E785 Hyperlipidemia, unspecified: Secondary | ICD-10-CM | POA: Diagnosis not present

## 2012-09-14 DIAGNOSIS — I10 Essential (primary) hypertension: Secondary | ICD-10-CM | POA: Diagnosis not present

## 2012-09-14 DIAGNOSIS — I6789 Other cerebrovascular disease: Secondary | ICD-10-CM | POA: Diagnosis not present

## 2012-09-14 DIAGNOSIS — E119 Type 2 diabetes mellitus without complications: Secondary | ICD-10-CM | POA: Diagnosis not present

## 2012-09-14 DIAGNOSIS — E785 Hyperlipidemia, unspecified: Secondary | ICD-10-CM | POA: Diagnosis not present

## 2012-10-11 DIAGNOSIS — H4011X0 Primary open-angle glaucoma, stage unspecified: Secondary | ICD-10-CM | POA: Diagnosis not present

## 2012-10-11 DIAGNOSIS — H409 Unspecified glaucoma: Secondary | ICD-10-CM | POA: Diagnosis not present

## 2012-10-11 DIAGNOSIS — Z961 Presence of intraocular lens: Secondary | ICD-10-CM | POA: Diagnosis not present

## 2012-10-11 DIAGNOSIS — E119 Type 2 diabetes mellitus without complications: Secondary | ICD-10-CM | POA: Diagnosis not present

## 2012-11-29 ENCOUNTER — Other Ambulatory Visit: Payer: Self-pay | Admitting: Family Medicine

## 2012-11-30 ENCOUNTER — Encounter: Payer: Self-pay | Admitting: *Deleted

## 2012-12-14 ENCOUNTER — Other Ambulatory Visit: Payer: Self-pay

## 2012-12-14 ENCOUNTER — Telehealth: Payer: Self-pay | Admitting: Family Medicine

## 2012-12-14 DIAGNOSIS — Z79899 Other long term (current) drug therapy: Secondary | ICD-10-CM

## 2012-12-14 DIAGNOSIS — E782 Mixed hyperlipidemia: Secondary | ICD-10-CM

## 2012-12-14 DIAGNOSIS — Z125 Encounter for screening for malignant neoplasm of prostate: Secondary | ICD-10-CM

## 2012-12-14 DIAGNOSIS — E119 Type 2 diabetes mellitus without complications: Secondary | ICD-10-CM

## 2012-12-14 NOTE — Telephone Encounter (Signed)
Blood work ordered per Dr. Bary Leriche protocol. Patient notified papers at front desk ready for pickup.

## 2012-12-14 NOTE — Telephone Encounter (Signed)
Request for paperwork to have blood work done for his 12/26/12 appt.

## 2012-12-21 DIAGNOSIS — E782 Mixed hyperlipidemia: Secondary | ICD-10-CM | POA: Diagnosis not present

## 2012-12-21 DIAGNOSIS — Z79899 Other long term (current) drug therapy: Secondary | ICD-10-CM | POA: Diagnosis not present

## 2012-12-21 DIAGNOSIS — Z125 Encounter for screening for malignant neoplasm of prostate: Secondary | ICD-10-CM | POA: Diagnosis not present

## 2012-12-21 DIAGNOSIS — E119 Type 2 diabetes mellitus without complications: Secondary | ICD-10-CM | POA: Diagnosis not present

## 2012-12-21 LAB — PSA, MEDICARE: PSA: 0.13 ng/mL (ref <=4.00)

## 2012-12-21 LAB — HEPATIC FUNCTION PANEL
AST: 25 U/L (ref 0–37)
Albumin: 4.5 g/dL (ref 3.5–5.2)
Alkaline Phosphatase: 97 U/L (ref 39–117)
Bilirubin, Direct: 0.1 mg/dL (ref 0.0–0.3)
Total Bilirubin: 0.5 mg/dL (ref 0.3–1.2)

## 2012-12-21 LAB — BASIC METABOLIC PANEL
CO2: 27 mEq/L (ref 19–32)
Calcium: 9.5 mg/dL (ref 8.4–10.5)
Glucose, Bld: 144 mg/dL — ABNORMAL HIGH (ref 70–99)
Potassium: 4.8 mEq/L (ref 3.5–5.3)
Sodium: 141 mEq/L (ref 135–145)

## 2012-12-21 LAB — LIPID PANEL
HDL: 50 mg/dL (ref >39)
LDL Cholesterol: 81 mg/dL (ref 0–99)

## 2012-12-21 LAB — HEMOGLOBIN A1C: Hgb A1c MFr Bld: 7.5 % — ABNORMAL HIGH (ref <5.7)

## 2012-12-26 ENCOUNTER — Ambulatory Visit (INDEPENDENT_AMBULATORY_CARE_PROVIDER_SITE_OTHER): Payer: Medicare Other | Admitting: Family Medicine

## 2012-12-26 ENCOUNTER — Encounter: Payer: Self-pay | Admitting: Family Medicine

## 2012-12-26 VITALS — BP 130/72 | HR 80 | Wt 231.4 lb

## 2012-12-26 DIAGNOSIS — R0989 Other specified symptoms and signs involving the circulatory and respiratory systems: Secondary | ICD-10-CM | POA: Diagnosis not present

## 2012-12-26 DIAGNOSIS — R0600 Dyspnea, unspecified: Secondary | ICD-10-CM

## 2012-12-26 NOTE — Progress Notes (Signed)
  Subjective:    Patient ID: Jerry Burns, male    DOB: Jan 10, 1934, 79 y.o.   MRN: [id number]  HPI This patient presents today overall doing fairly well has an issue of diabetes hyperlipidemia. In addition to this he states he does try to do a good job watching his diet and staying somewhat physically active he has noticed over the past couple months when he walks up an incline he gets short of breath feels a heaviness in his chest it does not radiate down the arms does not radiate into the legs. Denies any previous troubles with this. No sweats chills vomiting diarrhea. He states his sugars in the morning and been doing fairly well step on some days he do run a moderately elevated he thinks this is related to some of the snacks that he eats late at night. Patient otherwise tries the healthy and does take his medicines on a regular basis no family history of premature heart disease. He does not smoke.   Review of Systems Dyspnea on exertion noted, and otherwise no chest pains or shortness of breath, no swelling in the legs. No PND no orthopnea. No fever or chills.On today's exam- Lungs are clear hearts regular extremities no edema skin warm dry foot exam normal pulses are normal    Objective:   Physical Exam Please see above. Cardiac exam normal pulse normal blood pressure good       Assessment & Plan:  Dyspnea on exertion-this is concerning for the possibility of new onset heart disease. Being a diabetic he may not always get a classic symptoms of therefore it is important for him to see cardiology in the am sure they will be doing a stress test on him Diabetes fair control he is to curb late-night eating and try to see if that would help matters or at least eat low-carb alternatives. Lipid overall looks like good control continue current measures. Blood pressure good control Followup 3 months, cardiology as per above.

## 2013-01-17 DIAGNOSIS — H409 Unspecified glaucoma: Secondary | ICD-10-CM | POA: Diagnosis not present

## 2013-01-17 DIAGNOSIS — H4011X0 Primary open-angle glaucoma, stage unspecified: Secondary | ICD-10-CM | POA: Diagnosis not present

## 2013-01-19 ENCOUNTER — Other Ambulatory Visit: Payer: Self-pay | Admitting: Family Medicine

## 2013-01-26 ENCOUNTER — Ambulatory Visit: Payer: Medicare Other | Admitting: Cardiovascular Disease

## 2013-02-13 ENCOUNTER — Other Ambulatory Visit: Payer: Self-pay | Admitting: *Deleted

## 2013-02-13 MED ORDER — ATORVASTATIN CALCIUM 40 MG PO TABS: 40.0000 mg | ORAL_TABLET | Freq: Every day | ORAL | Status: DC

## 2013-03-01 ENCOUNTER — Encounter: Payer: Self-pay | Admitting: Cardiovascular Disease

## 2013-03-01 ENCOUNTER — Ambulatory Visit (INDEPENDENT_AMBULATORY_CARE_PROVIDER_SITE_OTHER): Payer: Medicare Other | Admitting: Cardiovascular Disease

## 2013-03-01 ENCOUNTER — Encounter: Payer: Self-pay | Admitting: *Deleted

## 2013-03-01 VITALS — BP 152/81 | HR 67 | Ht 72.0 in | Wt 226.2 lb

## 2013-03-01 DIAGNOSIS — I10 Essential (primary) hypertension: Secondary | ICD-10-CM | POA: Diagnosis not present

## 2013-03-01 DIAGNOSIS — I6523 Occlusion and stenosis of bilateral carotid arteries: Secondary | ICD-10-CM

## 2013-03-01 DIAGNOSIS — I6529 Occlusion and stenosis of unspecified carotid artery: Secondary | ICD-10-CM | POA: Diagnosis not present

## 2013-03-01 DIAGNOSIS — I658 Occlusion and stenosis of other precerebral arteries: Secondary | ICD-10-CM

## 2013-03-01 DIAGNOSIS — R079 Chest pain, unspecified: Secondary | ICD-10-CM

## 2013-03-01 NOTE — Patient Instructions (Addendum)
Your physician recommends that you schedule a follow-up appointment in: 4-6 wees Dr. Bronson Ing   Your physician has requested that you have en exercise stress myoview. For further information please visit [email or web address]. Please follow instruction sheet, as given.DO NOT TAKE YOUR DIABETIC MEDICATIONS PRIOR TO YOUR TEST  Your physician has requested that you have an echocardiogram. Echocardiography is a painless test that uses sound waves to create images of your heart. It provides your doctor with information about the size and shape of your heart and how well your heart's chambers and valves are working. This procedure takes approximately one hour. There are no restrictions for this procedure.  Your physician has requested that you regularly monitor and record your blood pressure readings at home. Please use the same machine at the same time of day to check your readings and record them to bring to your follow-up visit.CALL OFFICE IN 2 WEEKS TO ADVISE NUMBERS [phone]

## 2013-03-01 NOTE — Progress Notes (Signed)
Patient ID: Jerry Burns, male   DOB: [birth year], 79 y.o.   MRN: [id number]    CARDIOLOGY CONSULT NOTE  Patient ID: Jerry Burns MRN: [id number] DOB/AGE: 1934-03-22 79 y.o.  Primary Physician Reason for Consultation: chest tightness  HPI:  [name]") is a 79 y.o. Gentleman with a h/o HTN, TIA ten months ago, and diabetes mellitus for almost 20 years.   He walks every morning on mostly level ground with a slight incline at the end. Lately, he's been experiencing chest tightness in the upper left chest. He denies associated shortness of breath, lightheadedness, dizziness, syncope, and palpitations.  The last time he underwent any non-invasive imaging was roughly 9 years ago in [place]. It appears he also underwent a cardiac catheterization at that time which reportedly did not show anything significant.  He has no significant family h/o heart disease.   He says his BP is usually 135/72 mmHg.  His ECG shows normal sinus rhythm, LAFB, and a non-specific IVCD.  Review of systems complete and found to be negative unless listed above in HPI  Past Medical History: see HPI  SocHx: quit smoking 3-4 years ago (a couple of cigars a year), and used to smoke cigarettes in the late 1950's.  Family History  Problem Relation Age of Onset  . Diabetes    . Diabetes Brother     History   Social History  . Marital Status: Married    Spouse Name: N/A    Number of Children: N/A  . Years of Education: 14   Occupational History  . retired    Social History Main Topics  . Smoking status: Never Smoker   . Smokeless tobacco: Not on file  . Alcohol Use: Yes  . Drug Use: No  . Sexually Active: Not on file   Other Topics Concern  . Not on file   Social History Narrative  . No narrative on file      (Not in a hospital admission)  Physical exam Blood pressure 152/81, pulse 67, height 6' (1.829 m), weight 226 lb 4 oz (102.626 kg). General: NAD Neck: No JVD, no  thyromegaly or thyroid nodule.  Lungs: Clear to auscultation bilaterally with normal respiratory effort. CV: Nondisplaced PMI.  Heart regular S1/S2, no S3/S4, no murmur.  No peripheral edema.  No carotid bruit.  Normal pedal pulses.  Abdomen: Soft, nontender, no hepatosplenomegaly, no distention.  Skin: Intact without lesions or rashes.  Neurologic: Alert and oriented x 3.  Psych: Normal affect. Extremities: No clubbing or cyanosis.  HEENT: Normal.   Carotid Dopplers (October 2013): IMPRESSION:  1. Bilateral carotid bifurcation and proximal ICA plaque, resulting in less than 50% diameter stenosis. The exam does not exclude plaque ulceration or embolization. Continued surveillance recommended.  Labs:   Lab Results  Component Value Date   WBC 5.9 01/30/2009   HGB 13.0 01/30/2009   HCT 37.7* 01/30/2009   MCV 88.5 01/30/2009   PLT 190 01/30/2009   No results found for this basename: NA, K, CL, CO2, BUN, CREATININE, CALCIUM, LABALBU, PROT, BILITOT, ALKPHOS, ALT, AST, GLUCOSE,  in the last 168 hours No results found for this basename: CKTOTAL, CKMB, CKMBINDEX, TROPONINI    Lab Results  Component Value Date   CHOL 159 12/21/2012   CHOL 166 01/30/2009   CHOL 147 07/31/2008   Lab Results  Component Value Date   HDL 50 12/21/2012   HDL 52 01/30/2009   HDL 49 07/31/2008   Lab Results  Component  Value Date   LDLCALC 81 12/21/2012   LDLCALC 80 01/30/2009   LDLCALC 61 07/31/2008   Lab Results  Component Value Date   TRIG 142 12/21/2012   TRIG 171* 01/30/2009   TRIG 184* 07/31/2008   Lab Results  Component Value Date   CHOLHDL 3.2 12/21/2012   CHOLHDL 3.2 Ratio 01/30/2009   CHOLHDL 3.0 Ratio 07/31/2008   No results found for this basename: LDLDIRECT       EKG: see HPI   ASSESSMENT AND PLAN:  1. Chest pain: given his h/o long-standing diabetes mellitus and HTN, I will proceed with a treadmill Myoview and echocardiogram, to evaluate for ischemia and structural heart disease, respectively.  2. HTN:  I've asked him to check his BP 4-5 times a week for the next 2 weeks, and if they remain consistently elevated, I would consider adding Amlodipine to his medication regimen, especially given his h/o TIA and diabetes mellitus.  3. Carotid artery stenosis: purportedly followed by his PCP. Would require annual surveillance.  Signed: Kate Sable, M.D., F.A.C.C. 03/01/2013, 8:53 AM

## 2013-03-09 ENCOUNTER — Encounter (HOSPITAL_COMMUNITY): Payer: Self-pay

## 2013-03-09 ENCOUNTER — Encounter (HOSPITAL_COMMUNITY)
Admission: RE | Admit: 2013-03-09 | Discharge: 2013-03-09 | Disposition: A | Discharge status: Home / Self Care | Payer: Medicare Other | Source: Ambulatory Visit | Attending: Cardiovascular Disease | Admitting: Cardiovascular Disease

## 2013-03-09 ENCOUNTER — Ambulatory Visit (HOSPITAL_COMMUNITY)
Admission: RE | Admit: 2013-03-09 | Discharge: 2013-03-09 | Disposition: A | Discharge status: Home / Self Care | Payer: Medicare Other | Source: Ambulatory Visit | Attending: Cardiovascular Disease | Admitting: Cardiovascular Disease

## 2013-03-09 DIAGNOSIS — E119 Type 2 diabetes mellitus without complications: Secondary | ICD-10-CM | POA: Diagnosis not present

## 2013-03-09 DIAGNOSIS — I10 Essential (primary) hypertension: Secondary | ICD-10-CM | POA: Diagnosis not present

## 2013-03-09 DIAGNOSIS — R079 Chest pain, unspecified: Secondary | ICD-10-CM | POA: Insufficient documentation

## 2013-03-09 DIAGNOSIS — I517 Cardiomegaly: Secondary | ICD-10-CM | POA: Diagnosis not present

## 2013-03-09 DIAGNOSIS — I6523 Occlusion and stenosis of bilateral carotid arteries: Secondary | ICD-10-CM

## 2013-03-09 MED ORDER — TECHNETIUM TC 99M SESTAMIBI - CARDIOLITE
30.0000 | Freq: Once | INTRAVENOUS | Status: AC | PRN
Start: 2013-03-09 — End: 2013-03-09
  Administered 2013-03-09: 12:00:00 30 via INTRAVENOUS

## 2013-03-09 MED ORDER — SODIUM CHLORIDE 0.9 % IJ SOLN
INTRAMUSCULAR | Status: AC
  Administered 2013-03-09: 10 mL via INTRAVENOUS
  Filled 2013-03-09: qty 10

## 2013-03-09 MED ORDER — REGADENOSON 0.4 MG/5ML IV SOLN
INTRAVENOUS | Status: AC
  Filled 2013-03-09: qty 5

## 2013-03-09 MED ORDER — TECHNETIUM TC 99M SESTAMIBI - CARDIOLITE
10.0000 | Freq: Once | INTRAVENOUS | Status: AC | PRN
  Administered 2013-03-09: 11 via INTRAVENOUS

## 2013-03-09 NOTE — Progress Notes (Signed)
Stress Lab Nurses Notes - [facility]  WILBERTO HOYOS 03/09/2013 Reason for doing test: Chest Pain Type of test: Stress Cardiolite Nurse performing test: Gerrit Halls, RN Nuclear Medicine Tech: Dyanne Carrel Echo Tech: Not Applicable MD performing test: Myles Gip / Jory Sims NP Family MD: Dr. Sallee Lange Test explained and consent signed: yes IV started: 22g jelco, Saline lock flushed, No redness or edema and Saline lock started in radiology Symptoms: Fatigue & chest discomfort Treatment/Intervention: None Reason test stopped: fatigue and reached target HR After recovery IV was: Discontinued via X-ray tech and No redness or edema Patient to return to [place]. Med at : 12:00 Patient discharged: Home Patient's Condition upon discharge was: stable Comments: During test BP 154/76 & HR 139.  Recovery BP at 4:30 min was elevated 212/80 & HR 80. Post recovery at 9:37min was BP 158/78 & HR 74.  Symptoms resolved in recovery.  Geanie Cooley T

## 2013-03-09 NOTE — Progress Notes (Signed)
*  PRELIMINARY RESULTS* Echocardiogram 2D Echocardiogram has been performed.  Tera Partridge 03/09/2013, 8:40 AM

## 2013-03-13 ENCOUNTER — Telehealth: Payer: Self-pay | Admitting: *Deleted

## 2013-03-13 NOTE — Telephone Encounter (Signed)
Message copied by Truett Mainland on Mon Mar 13, 2013 10:10 AM ------      Message from: Kate Sable A      Created: Mon Mar 13, 2013  8:54 AM      Regarding: RE: pt follow up       You can book him for 9:20 on the 22nd.            ----- Message -----         From: Truett Mainland, LPN         Sent: [id number]   4:16 PM           To: Herminio Commons, MD      Subject: RE: pt follow up                                         21 and 22. Your mornings start at 8:20 and ends at 9:20. You are booked all morning both days            ----- Message -----         From: Herminio Commons, MD         Sent: 03/10/2013   4:01 PM           To: Truett Mainland, LPN      Subject: RE: pt follow up                                         What days am I in [place] next week, and what times are my morning appts currently?      I'd prefer not to doublebook.            ----- Message -----         From: Truett Mainland, LPN         Sent: [id number]   3:02 PM           To: Herminio Commons, MD      Subject: pt follow up                                             Hey! You wrote a result note on this pt to f/u with you within the next week. You are full next week,  would you like to over book to get him in to speak about cardiac cath?            Thanks        [place]                   ------

## 2013-03-14 ENCOUNTER — Encounter: Payer: Self-pay | Admitting: Cardiology

## 2013-03-14 ENCOUNTER — Encounter: Payer: Self-pay | Admitting: Cardiovascular Disease

## 2013-03-14 ENCOUNTER — Ambulatory Visit (INDEPENDENT_AMBULATORY_CARE_PROVIDER_SITE_OTHER): Payer: Medicare Other | Admitting: Cardiovascular Disease

## 2013-03-14 ENCOUNTER — Telehealth: Payer: Self-pay | Admitting: Cardiovascular Disease

## 2013-03-14 VITALS — BP 128/74 | HR 62 | Ht 71.0 in | Wt 223.0 lb

## 2013-03-14 DIAGNOSIS — R9439 Abnormal result of other cardiovascular function study: Secondary | ICD-10-CM

## 2013-03-14 DIAGNOSIS — I10 Essential (primary) hypertension: Secondary | ICD-10-CM

## 2013-03-14 DIAGNOSIS — Z01810 Encounter for preprocedural cardiovascular examination: Secondary | ICD-10-CM | POA: Diagnosis not present

## 2013-03-14 DIAGNOSIS — R079 Chest pain, unspecified: Secondary | ICD-10-CM | POA: Diagnosis not present

## 2013-03-14 MED ORDER — HYDROCHLOROTHIAZIDE 12.5 MG PO CAPS: 12.5000 mg | ORAL_CAPSULE | Freq: Every day | ORAL | Status: DC

## 2013-03-14 NOTE — Patient Instructions (Addendum)
Your physician has requested that you have a cardiac catheterization. Cardiac catheterization is used to diagnose and/or treat various heart conditions. Doctors may recommend this procedure for a number of different reasons. The most common reason is to evaluate chest pain. Chest pain can be a symptom of coronary artery disease (CAD), and cardiac catheterization can show whether plaque is narrowing or blocking your heart's arteries. This procedure is also used to evaluate the valves, as well as measure the blood flow and oxygen levels in different parts of your heart. For further information please visit HugeFiesta.[email or web address]. Please follow instruction sheet, as given.  Your physician has recommended you make the following change in your medication:  Start: Hydrochlorothiazide (HCTZ) 12.5 mg everyday. Prescription has been sent to your local pharmacy.   Continue all other medications the same.   Labs today for CBC, PT INR, PTT, BMET at [facility].  Chest X-ray today at [facility].   Follow up will be given after discharge.

## 2013-03-14 NOTE — Progress Notes (Signed)
Patient ID: Jerry Burns, male   DOB: 1934/04/27, 79 y.o.   MRN: [id number]    SUBJECTIVE: Jerry Burns is here to f/u on the results of his cardiac testing. He's been monitoring his BP at home and there have been several readings in the high 140-150 mmHg range. He's not had any additional episodes of chest pain. His echocardiogram revealed normal LV systolic function. However, his exercise nuclear study was markedly abnormal, revealing multivessel ischemia (results noted below).    Filed Vitals:   03/14/13 0819  BP: 128/74  Pulse: 62  Height: 5\' 11"  (1.803 m)  Weight: 223 lb (101.152 kg)     PHYSICAL EXAM General: NAD Neck: No JVD, no thyromegaly or thyroid nodule.  Lungs: Clear to auscultation bilaterally with normal respiratory effort. CV: Nondisplaced PMI.  Heart regular S1/S2, no S3/S4, no murmur.  No peripheral edema.  No carotid bruit.  Normal pedal pulses.  Abdomen: Soft, nontender, no hepatosplenomegaly, no distention.  Neurologic: Alert and oriented x 3.  Psych: Normal affect. Extremities: No clubbing or cyanosis.     LABS: Basic Metabolic Panel: No results found for this basename: NA, K, CL, CO2, GLUCOSE, BUN, CREATININE, CALCIUM, MG, PHOS,  in the last 72 hours Liver Function Tests: No results found for this basename: AST, ALT, ALKPHOS, BILITOT, PROT, ALBUMIN,  in the last 72 hours No results found for this basename: LIPASE, AMYLASE,  in the last 72 hours CBC: No results found for this basename: WBC, NEUTROABS, HGB, HCT, MCV, PLT,  in the last 72 hours Cardiac Enzymes: No results found for this basename: CKTOTAL, CKMB, CKMBINDEX, TROPONINI,  in the last 72 hours BNP: No components found with this basename: POCBNP,  D-Dimer: No results found for this basename: DDIMER,  in the last 72 hours Hemoglobin A1C: No results found for this basename: HGBA1C,  in the last 72 hours Fasting Lipid Panel: No results found for this basename: CHOL, HDL, LDLCALC, TRIG,  CHOLHDL, LDLDIRECT,  in the last 72 hours Thyroid Function Tests: No results found for this basename: TSH, T4TOTAL, FREET3, T3FREE, THYROIDAB,  in the last 72 hours Anemia Panel: No results found for this basename: VITAMINB12, FOLATE, FERRITIN, TIBC, IRON, RETICCTPCT,  in the last 72 hours  RADIOLOGY: Nm Myocar Single W/spect W/wall Motion And Ef  03/09/2013   Ordering Physician: Kate Sable  Reading Physician: Satira Sark  Clinical Data: 79 year old male with history of hypertension, TIA, longstanding diabetes mellitus, referred with exertional chest discomfort for the assessment of ischemia.  NUCLEAR MEDICINE STRESS MYOVIEW STUDY WITH SPECT AND LEFT VENTRICULAR EJECTION FRACTION  Radionuclide Data: One-day rest/stress protocol performed with 10/30 mCi of Tc-26m Myoview.  Stress Data: The patient was exercised on a Bruce protocol for 4 minutes and 51 seconds achieving a maximum workload of seven METS. Heart rate increased to 139 beats per minute, 99% MPHR.  Peak blood pressure was 212/80. Anginal chest pain was described near peak. Relatively nonspecific ST-segment changes were noted with exercise, however developed into downsloping ST-segment depression in leads II, III, aVF, and V4-V6 in recovery suggestive of ischemia.  No arrhythmias.  EKG: Baseline tracing shows sinus rhythm at 61 beats per minute with left anterior fascicular block, decreased R-wave progression, nonspecific ST-T changes.  Scintigraphic Data: Analysis of the raw perfusion imaging shows adequate radiotracer uptake.  Tomographic views obtained using the short axis, vertical long axis, and horizontal long axis planes.  There is a relatively large inferior/inferolateral defect with severe count reduction in the mid  to apical segment that is reversible, less prominent in the basal segment.  There is a small, mild intensity fixed apical anteroseptal defect.  There is a reversible apical lateral defect of moderate intensity.  Also  a partially reversible moderate sized inferior septal defect of mild intensity.  Overall consistent with ischemia in multivessel distribution.  The gated imaging reveals an EDV of 145, PSV 70, T I D ratio 1.11, and LVEF 52%, possible mild inferior hypokinesis noted.    IMPRESSION: High risk abnormal exercise Myoview.  Abnormal ST-segment changes were noted in recovery after exercise into stage 2 with development of anginal chest pain.  There was a hypertensive response. Perfusion imaging is consistent with ischemia in a multivessel distribution with perfusion defects discussed above.  T I D ratio is upper normal, LVEF 52% with possible mild inferior hypokinesis.   Original Report Authenticated By: Rozann Lesches      ASSESSMENT AND PLAN: 1. Chest pain in the setting of markedly abnormal (high risk) nuclear stress test: I discussed the details at length with the patient, and he has agreed to undergo coronary angiography to further delineate his coronary anatomy, knowing that either PCI or CABG is a possibility. His echocardiogram revealed normal LV systolic function. Continue ASA and Lipitor. 2. HTN: uncontrolled at home. Will add HCTZ 12.5 mg daily, and have him continue to monitor his BP at home.   Kate Sable, M.D., F.A.C.C.

## 2013-03-14 NOTE — Telephone Encounter (Signed)
No precert required 

## 2013-03-14 NOTE — Telephone Encounter (Signed)
Pre Cert - Cath DX: Chest pain, abnormal stress test Thursday 8-21 at 8:30 with Dr. Rockey Situ

## 2013-03-15 ENCOUNTER — Encounter: Payer: Self-pay | Admitting: *Deleted

## 2013-03-15 DIAGNOSIS — Z01810 Encounter for preprocedural cardiovascular examination: Secondary | ICD-10-CM | POA: Diagnosis not present

## 2013-03-15 DIAGNOSIS — J984 Other disorders of lung: Secondary | ICD-10-CM | POA: Diagnosis not present

## 2013-03-15 DIAGNOSIS — R079 Chest pain, unspecified: Secondary | ICD-10-CM | POA: Diagnosis not present

## 2013-03-15 DIAGNOSIS — J438 Other emphysema: Secondary | ICD-10-CM | POA: Diagnosis not present

## 2013-03-15 DIAGNOSIS — R9439 Abnormal result of other cardiovascular function study: Secondary | ICD-10-CM | POA: Diagnosis not present

## 2013-03-15 LAB — PROTIME-INR

## 2013-03-16 ENCOUNTER — Inpatient Hospital Stay (HOSPITAL_BASED_OUTPATIENT_CLINIC_OR_DEPARTMENT_OTHER)
Admission: RE | Admit: 2013-03-16 | Discharge: 2013-03-16 | Disposition: A | Discharge status: Home / Self Care | Payer: Medicare Other | Source: Ambulatory Visit | Attending: Cardiovascular Disease | Admitting: Cardiovascular Disease

## 2013-03-16 ENCOUNTER — Other Ambulatory Visit: Payer: Self-pay | Admitting: Cardiovascular Disease

## 2013-03-16 ENCOUNTER — Encounter (HOSPITAL_BASED_OUTPATIENT_CLINIC_OR_DEPARTMENT_OTHER)
Admission: RE | Disposition: A | Discharge status: Home / Self Care | Payer: Self-pay | Source: Ambulatory Visit | Attending: Cardiovascular Disease

## 2013-03-16 DIAGNOSIS — E119 Type 2 diabetes mellitus without complications: Secondary | ICD-10-CM | POA: Insufficient documentation

## 2013-03-16 DIAGNOSIS — Z01810 Encounter for preprocedural cardiovascular examination: Secondary | ICD-10-CM

## 2013-03-16 DIAGNOSIS — R9439 Abnormal result of other cardiovascular function study: Secondary | ICD-10-CM | POA: Insufficient documentation

## 2013-03-16 DIAGNOSIS — R079 Chest pain, unspecified: Secondary | ICD-10-CM

## 2013-03-16 DIAGNOSIS — I2510 Atherosclerotic heart disease of native coronary artery without angina pectoris: Secondary | ICD-10-CM | POA: Diagnosis not present

## 2013-03-16 DIAGNOSIS — I10 Essential (primary) hypertension: Secondary | ICD-10-CM | POA: Insufficient documentation

## 2013-03-16 DIAGNOSIS — R0789 Other chest pain: Secondary | ICD-10-CM | POA: Insufficient documentation

## 2013-03-16 DIAGNOSIS — I6529 Occlusion and stenosis of unspecified carotid artery: Secondary | ICD-10-CM | POA: Insufficient documentation

## 2013-03-16 SURGERY — JV CORONARY ANGIOGRAM

## 2013-03-16 MED ORDER — OXYCODONE-ACETAMINOPHEN 5-325 MG PO TABS: 1.0000 | ORAL_TABLET | ORAL | Status: DC | PRN

## 2013-03-16 MED ORDER — SODIUM CHLORIDE 0.45 % IV SOLN: INTRAVENOUS | Status: AC

## 2013-03-16 MED ORDER — ISOSORBIDE MONONITRATE ER 30 MG PO TB24: 30.0000 mg | ORAL_TABLET | Freq: Every day | ORAL | Status: DC

## 2013-03-16 MED ORDER — ASPIRIN EC 325 MG PO TBEC: 325.0000 mg | DELAYED_RELEASE_TABLET | Freq: Every day | ORAL | Status: DC

## 2013-03-16 MED ORDER — CARVEDILOL 3.125 MG PO TABS: 3.1250 mg | ORAL_TABLET | Freq: Two times a day (BID) | ORAL | Status: DC

## 2013-03-16 MED ORDER — SODIUM CHLORIDE 0.9 % IJ SOLN: 3.0000 mL | Freq: Two times a day (BID) | INTRAMUSCULAR | Status: DC

## 2013-03-16 MED ORDER — ACETAMINOPHEN 325 MG PO TABS: 650.0000 mg | ORAL_TABLET | ORAL | Status: DC | PRN

## 2013-03-16 MED ORDER — SODIUM CHLORIDE 0.9 % IV SOLN: 250.0000 mL | INTRAVENOUS | Status: DC | PRN

## 2013-03-16 MED ORDER — ASPIRIN 81 MG PO CHEW: 324.0000 mg | CHEWABLE_TABLET | ORAL | Status: DC

## 2013-03-16 MED ORDER — ONDANSETRON HCL 4 MG/2ML IJ SOLN: 4.0000 mg | Freq: Four times a day (QID) | INTRAMUSCULAR | Status: DC | PRN

## 2013-03-16 MED ORDER — SODIUM CHLORIDE 0.9 % IJ SOLN: 3.0000 mL | INTRAMUSCULAR | Status: DC | PRN

## 2013-03-16 NOTE — OR Nursing (Signed)
Tegaderm dressing applied, site level 0, bedrest begins at 0940

## 2013-03-16 NOTE — CV Procedure (Signed)
      Catheterization   Indication: Abnormal myovue  Procedure: After informed consent and clinical "time out" the right groin was prepped and draped in a sterile fashion.  A 5Fr sheath was placed in the right femoral artery using seldinger technique and local lidocaine.  Standard JL4, JR4 and angled pigtail catheters were used to engage the coronary arteries.  Coronary arteries were visualized in orthogonal views using caudal and cranial angulation.  RAO ventriculography was done using 25* cc of contrast.    Medications:   Versed: 2 mg's  Fentanyl: 0 ug's  Coronary Arteries: Right dominant with no anomalies  LM:  Normal  LAD:  40% proximal 30% mid vessel disease 70% focal distal disease vessel at most 33mm distally    D1:  30% proximal  D2  Normal small vessel  D3:  Normal small vessel  Circumflex: 30% mid vessel disease   OM1: Large Branching vessel 30% mid vessel disease  AV groove:  Normal   RCA:  20% proximal disease.  70% focal disease in PLB Less than 34mm vessel   PDA: Normal  PLA:  70% mid see above  Ventriculography: EF: 60 %,  No MR  Hemodynamics:  Aortic Pressure: 131 60 mmHg  LV Pressure: 140 12 mmHg  Impression:  Distal two vessel disease.  Distal LAD and PLB both 43mm or less vessels.  Not on adequate medical Rx with HTN response on stress test.  Start low dose coreg and nitrates  Consider PCI if medical Rx fails.  Clinical symptoms have been minor compared to stress test result.    Jenkins Rouge 03/16/2013 9:25 AM

## 2013-03-16 NOTE — OR Nursing (Signed)
+  Allen's test right hand 

## 2013-03-16 NOTE — H&P (View-Only) (Signed)
Patient ID: RODRIGUS NAFUS, male   DOB: [birth year], 79 y.o.   MRN: [id number]    CARDIOLOGY CONSULT NOTE  Patient ID: RATHA HARTSEL MRN: [id number] DOB/AGE: 1934-03-22 79 y.o.  Primary Physician Reason for Consultation: chest tightness  HPI:  [name]") is a 79 y.o. Gentleman with a h/o HTN, TIA ten months ago, and diabetes mellitus for almost 20 years.   He walks every morning on mostly level ground with a slight incline at the end. Lately, he's been experiencing chest tightness in the upper left chest. He denies associated shortness of breath, lightheadedness, dizziness, syncope, and palpitations.  The last time he underwent any non-invasive imaging was roughly 9 years ago in [place]. It appears he also underwent a cardiac catheterization at that time which reportedly did not show anything significant.  He has no significant family h/o heart disease.   He says his BP is usually 135/72 mmHg.  His ECG shows normal sinus rhythm, LAFB, and a non-specific IVCD.  Review of systems complete and found to be negative unless listed above in HPI  Past Medical History: see HPI  SocHx: quit smoking 3-4 years ago (a couple of cigars a year), and used to smoke cigarettes in the late 1950's.  Family History  Problem Relation Age of Onset  . Diabetes    . Diabetes Brother     History   Social History  . Marital Status: Married    Spouse Name: N/A    Number of Children: N/A  . Years of Education: 14   Occupational History  . retired    Social History Main Topics  . Smoking status: Never Smoker   . Smokeless tobacco: Not on file  . Alcohol Use: Yes  . Drug Use: No  . Sexually Active: Not on file   Other Topics Concern  . Not on file   Social History Narrative  . No narrative on file      (Not in a hospital admission)  Physical exam Blood pressure 152/81, pulse 67, height 6' (1.829 m), weight 226 lb 4 oz (102.626 kg). General: NAD Neck: No JVD, no  thyromegaly or thyroid nodule.  Lungs: Clear to auscultation bilaterally with normal respiratory effort. CV: Nondisplaced PMI.  Heart regular S1/S2, no S3/S4, no murmur.  No peripheral edema.  No carotid bruit.  Normal pedal pulses.  Abdomen: Soft, nontender, no hepatosplenomegaly, no distention.  Skin: Intact without lesions or rashes.  Neurologic: Alert and oriented x 3.  Psych: Normal affect. Extremities: No clubbing or cyanosis.  HEENT: Normal.   Carotid Dopplers (October 2013): IMPRESSION:  1. Bilateral carotid bifurcation and proximal ICA plaque, resulting in less than 50% diameter stenosis. The exam does not exclude plaque ulceration or embolization. Continued surveillance recommended.  Labs:   Lab Results  Component Value Date   WBC 5.9 01/30/2009   HGB 13.0 01/30/2009   HCT 37.7* 01/30/2009   MCV 88.5 01/30/2009   PLT 190 01/30/2009   No results found for this basename: NA, K, CL, CO2, BUN, CREATININE, CALCIUM, LABALBU, PROT, BILITOT, ALKPHOS, ALT, AST, GLUCOSE,  in the last 168 hours No results found for this basename: CKTOTAL, CKMB, CKMBINDEX, TROPONINI    Lab Results  Component Value Date   CHOL 159 12/21/2012   CHOL 166 01/30/2009   CHOL 147 07/31/2008   Lab Results  Component Value Date   HDL 50 12/21/2012   HDL 52 01/30/2009   HDL 49 07/31/2008   Lab Results  Component  Value Date   LDLCALC 81 12/21/2012   LDLCALC 80 01/30/2009   LDLCALC 61 07/31/2008   Lab Results  Component Value Date   TRIG 142 12/21/2012   TRIG 171* 01/30/2009   TRIG 184* 07/31/2008   Lab Results  Component Value Date   CHOLHDL 3.2 12/21/2012   CHOLHDL 3.2 Ratio 01/30/2009   CHOLHDL 3.0 Ratio 07/31/2008   No results found for this basename: LDLDIRECT       EKG: see HPI   ASSESSMENT AND PLAN:  1. Chest pain: given his h/o long-standing diabetes mellitus and HTN, I will proceed with a treadmill Myoview and echocardiogram, to evaluate for ischemia and structural heart disease, respectively.  2. HTN:  I've asked him to check his BP 4-5 times a week for the next 2 weeks, and if they remain consistently elevated, I would consider adding Amlodipine to his medication regimen, especially given his h/o TIA and diabetes mellitus.  3. Carotid artery stenosis: purportedly followed by his PCP. Would require annual surveillance.  Signed: Kate Sable, M.D., F.A.C.C. 03/01/2013, 8:53 AM

## 2013-03-16 NOTE — OR Nursing (Signed)
Meal served 

## 2013-03-16 NOTE — OR Nursing (Signed)
Discharge instructions reviewed and signed, pt stated understanding, ambulated in hall without difficulty, site level 0, transported to wife's car via wheelchair 

## 2013-03-16 NOTE — OR Nursing (Signed)
Dr Johnsie Cancel at bedside to discuss results and treatment plan with Jerry Burns and family

## 2013-03-16 NOTE — Interval H&P Note (Signed)
History and Physical Interval Note:  03/16/2013 9:20 AM  Jerry Burns  has presented today for surgery, with the diagnosis of cp  The various methods of treatment have been discussed with the patient and family. After consideration of risks, benefits and other options for treatment, the patient has consented to  Procedure(s): [place] (N/A) as a surgical intervention .  The patient's history has been reviewed, patient examined, no change in status, stable for surgery.  I have reviewed the patient's chart and labs.  Questions were answered to the patient's satisfaction.     Jenkins Rouge

## 2013-03-17 ENCOUNTER — Ambulatory Visit: Payer: Medicare Other | Admitting: Cardiovascular Disease

## 2013-04-10 ENCOUNTER — Encounter: Payer: Self-pay | Admitting: Cardiovascular Disease

## 2013-04-10 ENCOUNTER — Ambulatory Visit (INDEPENDENT_AMBULATORY_CARE_PROVIDER_SITE_OTHER): Payer: Medicare Other | Admitting: Cardiovascular Disease

## 2013-04-10 VITALS — BP 139/65 | HR 73 | Ht 71.5 in | Wt 224.0 lb

## 2013-04-10 DIAGNOSIS — R079 Chest pain, unspecified: Secondary | ICD-10-CM

## 2013-04-10 DIAGNOSIS — I10 Essential (primary) hypertension: Secondary | ICD-10-CM | POA: Diagnosis not present

## 2013-04-10 DIAGNOSIS — I2510 Atherosclerotic heart disease of native coronary artery without angina pectoris: Secondary | ICD-10-CM | POA: Diagnosis not present

## 2013-04-10 DIAGNOSIS — H409 Unspecified glaucoma: Secondary | ICD-10-CM | POA: Diagnosis not present

## 2013-04-10 DIAGNOSIS — H4011X0 Primary open-angle glaucoma, stage unspecified: Secondary | ICD-10-CM | POA: Diagnosis not present

## 2013-04-10 NOTE — Patient Instructions (Addendum)
Your physician recommends that you schedule a follow-up appointment in: 8-10 weeks  Your physician has recommended you make the following change in your medication:   1) INCREASE YOUR COREG 3.125MG   (CARDEVILOL) TO TWICE DAILY

## 2013-04-10 NOTE — Progress Notes (Signed)
Patient ID: Jerry Burns, male   DOB: Apr 29, 1934, 79 y.o.   MRN: [id number]   SUBJECTIVE: [name] cardiac cath showed distal small vessel CAD of the LAD and PLB. His HCTZ was stopped and he was started on Coreg and Imdur. His BP is under better control. He does continue to feel some chest tightness with exertion, particularly when walking uphill.  He had only been taking his Coreg once daily.    Allergies  Allergen Reactions  . Latex Other (See Comments)    bandaids cause blisters after 24 hours     Current Outpatient Prescriptions  Medication Sig Dispense Refill  . aspirin 325 MG tablet Take 325 mg by mouth daily.      Marland Kitchen atorvastatin (LIPITOR) 40 MG tablet Take 1 tablet (40 mg total) by mouth daily.  90 tablet  1  . BAYER CONTOUR TEST test strip USE ONE STRIP TO TEST BLOOD SUGAR EVERY DAY AS DIRECTED  50 each  0  . carvedilol (COREG) 3.125 MG tablet Take 3.125 mg by mouth daily.      Marland Kitchen dutasteride (AVODART) 0.5 MG capsule Take 0.5 mg by mouth daily.      . insulin glargine (LANTUS) 100 UNIT/ML injection Inject 31-34 Units into the skin at bedtime.      . isosorbide mononitrate (IMDUR) 30 MG 24 hr tablet Take 1 tablet (30 mg total) by mouth daily.  90 tablet  3  . metFORMIN (GLUCOPHAGE) 1000 MG tablet Take 1,000 mg by mouth 2 (two) times daily with a meal.      . Multiple Vitamin (MULTIVITAMIN) tablet Take 1 tablet by mouth daily.      . quinapril (ACCUPRIL) 40 MG tablet Take 40 mg by mouth at bedtime.      . Tamsulosin HCl (FLOMAX) 0.4 MG CAPS Take by mouth.      . hydrochlorothiazide (MICROZIDE) 12.5 MG capsule Take 1 capsule (12.5 mg total) by mouth daily.  30 capsule  6   No current facility-administered medications for this visit.    Past Medical History  Diagnosis Date  . Diabetes mellitus   . Hyperlipidemia   . Stroke   . Arthritis     Bilateral knee    Past Surgical History  Procedure Laterality Date  . Tonsillectomy    . Appendectomy    . Shoulder  surgery    . Colonoscopy      History   Social History  . Marital Status: Married    Spouse Name: N/A    Number of Children: N/A  . Years of Education: 14   Occupational History  . retired    Social History Main Topics  . Smoking status: Former Smoker -- 20 years    Types: Cigarettes, Cigars    Quit date: 07/28/2007  . Smokeless tobacco: Not on file     Comment: smoke 1-2 cigars a week. Starting to 2005 he only smoked 5 cigars a year.  . Alcohol Use: Yes  . Drug Use: No  . Sexual Activity: Not on file   Other Topics Concern  . Not on file   Social History Narrative  . No narrative on file     Filed Vitals:   04/10/13 0819  BP: 139/65  Pulse: 73  Height: 5' 11.5" (1.816 m)  Weight: 224 lb (101.606 kg)    PHYSICAL EXAM General: NAD Neck: No JVD, no thyromegaly or thyroid nodule.  Lungs: Clear to auscultation bilaterally with normal respiratory effort. CV: Nondisplaced PMI.  Heart regular S1/S2, no S3/S4, no murmur.  No peripheral edema.  No carotid bruit.  Normal pedal pulses.  Abdomen: Soft, nontender, no hepatosplenomegaly, no distention.  Neurologic: Alert and oriented x 3.  Psych: Normal affect. Extremities: No clubbing or cyanosis.   ECG: reviewed and available in electronic records.      ASSESSMENT AND PLAN: 1. CAD: goal is to control symptoms with optimal medical therapy. I've asked him to take the Coreg 3.125 mg bid. If in the next several weeks he continues to experience symptoms, this can be titrated up in concert with his Imdur if need be. I've asked him to give our office a call in 2 weeks to let me know how he's doing from this standpoint. 2. HTN: under better control. Continue current therapy. He will continue to monitor this at home.   Kate Sable, M.D., F.A.C.C.

## 2013-04-11 ENCOUNTER — Encounter: Payer: Self-pay | Admitting: Cardiovascular Disease

## 2013-04-17 ENCOUNTER — Other Ambulatory Visit: Payer: Self-pay | Admitting: *Deleted

## 2013-04-17 ENCOUNTER — Telehealth: Payer: Self-pay | Admitting: Family Medicine

## 2013-04-17 DIAGNOSIS — E119 Type 2 diabetes mellitus without complications: Secondary | ICD-10-CM

## 2013-04-17 NOTE — Telephone Encounter (Signed)
Need paperwork to have Blood Work completed before Appointment on 04/27/2013.    Please call patient when this is ready to be picked up.

## 2013-04-17 NOTE — Telephone Encounter (Signed)
Blood work papers ready for pick up. Pt notified.

## 2013-04-17 NOTE — Telephone Encounter (Signed)
HgA1C and urine microprotein is all he needs

## 2013-04-18 ENCOUNTER — Encounter: Payer: Self-pay | Admitting: Cardiovascular Disease

## 2013-04-24 ENCOUNTER — Telehealth: Payer: Self-pay | Admitting: *Deleted

## 2013-04-24 DIAGNOSIS — E119 Type 2 diabetes mellitus without complications: Secondary | ICD-10-CM | POA: Diagnosis not present

## 2013-04-24 NOTE — Telephone Encounter (Signed)
PT was told to check back in with update on how he was doing. Nothing has improved and his is going to drop off BP readings in office this week. HIghest BP has been 180/? anf ?/60

## 2013-04-24 NOTE — Telephone Encounter (Signed)
FYI

## 2013-04-24 NOTE — Telephone Encounter (Signed)
He may need Coreg increased to 6.25 mg bid for optimal BP control. Will have to first assess both BP and HR on his log.

## 2013-04-25 LAB — MICROALBUMIN, URINE: Microalb, Ur: 2.48 mg/dL — ABNORMAL HIGH (ref 0.00–1.89)

## 2013-04-27 ENCOUNTER — Ambulatory Visit (INDEPENDENT_AMBULATORY_CARE_PROVIDER_SITE_OTHER): Payer: Medicare Other | Admitting: Family Medicine

## 2013-04-27 ENCOUNTER — Encounter: Payer: Self-pay | Admitting: Family Medicine

## 2013-04-27 VITALS — BP 122/78 | Ht 72.0 in | Wt 228.0 lb

## 2013-04-27 DIAGNOSIS — E1149 Type 2 diabetes mellitus with other diabetic neurological complication: Secondary | ICD-10-CM

## 2013-04-27 NOTE — Progress Notes (Signed)
  Subjective:    Patient ID: Jerry Burns, male    DOB: 03-02-1934, 79 y.o.   MRN: [id number]  Diabetes He presents for his follow-up diabetic visit. He has type 2 diabetes mellitus. His disease course has been stable. There are no hypoglycemic associated symptoms. There are no diabetic associated symptoms. There are no hypoglycemic complications. Symptoms are stable. There are no diabetic complications. There are no known risk factors for coronary artery disease. Current diabetic treatment includes oral agent (monotherapy). He is compliant with treatment all of the time.  Patient states he has no concerns today. Lab results completed and in the system.   His blood pressures have been elevated at times he states he also states morning blood sugars between 110 to 1:30 he does not check them later in the day He has a history of diabetes hypertension lipid Review of Systems Denies wheezing denies chest pain denies swelling in the legs no rectal bleeding    Objective:   Physical Exam Lungs are clear hearts regular pulse normal diabetic foot exam was completed Blood pressure very      Assessment & Plan:  #1 diabetes subpar control we talked about ways to monitor it he will send [place] some readings in the next couple weeks looking at his morning sugars as well as 2 hours after lunch and dinner more than likely he will need adjustments in his medicine possibly short acting insulin with his meals 20 minutes spent with patient he will followup in 3-4 months

## 2013-04-27 NOTE — Telephone Encounter (Signed)
.  left message to have patient return my call per pending BP readings in office to review and make decision

## 2013-05-17 ENCOUNTER — Encounter: Payer: Self-pay | Admitting: Cardiovascular Disease

## 2013-05-20 ENCOUNTER — Other Ambulatory Visit: Payer: Self-pay | Admitting: Family Medicine

## 2013-05-22 DIAGNOSIS — Z23 Encounter for immunization: Secondary | ICD-10-CM | POA: Diagnosis not present

## 2013-05-23 NOTE — Telephone Encounter (Signed)
.  left message to have patient return my call. This nurse did not locate any BP readings that the pt has brought in at this time to advise any medication changes, will await pt records to make decision

## 2013-05-29 NOTE — Telephone Encounter (Signed)
.  left message to have patient return my call to clarify with pt when he will bring in his BP recordings per noted pt has upcoming apt 06-08-13 at 8:20am

## 2013-06-08 ENCOUNTER — Encounter: Payer: Self-pay | Admitting: Cardiovascular Disease

## 2013-06-08 ENCOUNTER — Ambulatory Visit (INDEPENDENT_AMBULATORY_CARE_PROVIDER_SITE_OTHER): Payer: Medicare Other | Admitting: Cardiovascular Disease

## 2013-06-08 VITALS — BP 144/74 | HR 65 | Ht 72.0 in | Wt 224.0 lb

## 2013-06-08 DIAGNOSIS — I2510 Atherosclerotic heart disease of native coronary artery without angina pectoris: Secondary | ICD-10-CM | POA: Diagnosis not present

## 2013-06-08 DIAGNOSIS — I10 Essential (primary) hypertension: Secondary | ICD-10-CM | POA: Diagnosis not present

## 2013-06-08 DIAGNOSIS — R079 Chest pain, unspecified: Secondary | ICD-10-CM | POA: Diagnosis not present

## 2013-06-08 MED ORDER — CARVEDILOL 6.25 MG PO TABS: 6.2500 mg | ORAL_TABLET | Freq: Two times a day (BID) | ORAL | Status: DC

## 2013-06-08 NOTE — Patient Instructions (Addendum)
Your physician wants you to follow-up in:  6 months  You will receive a reminder letter in the mail two months in advance. If you don't receive a letter, please call our office to schedule the follow-up appointment.   Your physician has recommended you make the following change in your medication:    INCREASE Coreg to 6.25 mg twice a day Please take two of your 3.125 mg tablets twice daily until they are finished  All other medications remain the same  Your physician has requested that you regularly monitor and record your blood pressure readings at home. Please use the same machine at the same time of day to check your readings and record them to bring to bring back in one month

## 2013-06-08 NOTE — Progress Notes (Signed)
Patient ID: Jerry Burns, male   DOB: [birth year], 79 y.o.   MRN: [id number]      SUBJECTIVE: The patient returns for followup today with regards to hypertension management. His blood pressures have been ranging from 118-190/64-72 mmHg. His heart rate has primarily stayed in the 60 beat per minute range. He's currently taking carvedilol 3.125 mg twice daily and Accupril 40 mg every evening.  He denies chest pain, shortness of breath, and leg swelling. He says he now has a Zest for life every morning and feels very excited when he wakes up.  He gets a very mild chest pain in the upper left chest region, but he says this is barely noticeable and only occurs when he is heavily exerting himself or walking uphill.    Allergies  Allergen Reactions  . Latex Other (See Comments)    bandaids cause blisters after 24 hours     Current Outpatient Prescriptions  Medication Sig Dispense Refill  . aspirin 325 MG tablet Take 325 mg by mouth daily.      Marland Kitchen atorvastatin (LIPITOR) 40 MG tablet Take 1 tablet (40 mg total) by mouth daily.  90 tablet  1  . BAYER CONTOUR TEST test strip USE ONE STRIP TO TEST BLOOD SUGAR EVERY DAY AS DIRECTED  50 each  0  . carvedilol (COREG) 3.125 MG tablet Take 3.125 mg by mouth 2 (two) times daily.       Marland Kitchen dutasteride (AVODART) 0.5 MG capsule Take 0.5 mg by mouth daily.      . insulin glargine (LANTUS) 100 UNIT/ML injection Inject 31-34 Units into the skin at bedtime.      . isosorbide mononitrate (IMDUR) 30 MG 24 hr tablet Take 1 tablet (30 mg total) by mouth daily.  90 tablet  3  . metFORMIN (GLUCOPHAGE) 1000 MG tablet Take 1,000 mg by mouth 2 (two) times daily with a meal.      . Multiple Vitamin (MULTIVITAMIN) tablet Take 1 tablet by mouth daily.      . quinapril (ACCUPRIL) 40 MG tablet Take 40 mg by mouth at bedtime.      Marland Kitchen RELION MINI PEN NEEDLES 31G X 6 MM MISC USE AS DIRECTED  100 each  0  . Tamsulosin HCl (FLOMAX) 0.4 MG CAPS Take by mouth.       No current  facility-administered medications for this visit.    Past Medical History  Diagnosis Date  . Diabetes mellitus   . Hyperlipidemia   . Stroke   . Arthritis     Bilateral knee    Past Surgical History  Procedure Laterality Date  . Tonsillectomy    . Appendectomy    . Shoulder surgery    . Colonoscopy      History   Social History  . Marital Status: Married    Spouse Name: N/A    Number of Children: N/A  . Years of Education: 14   Occupational History  . retired    Social History Main Topics  . Smoking status: Former Smoker -- 20 years    Types: Cigarettes, Cigars    Quit date: 07/28/2007  . Smokeless tobacco: Not on file     Comment: smoke 1-2 cigars a week. Starting to 2005 he only smoked 5 cigars a year.  . Alcohol Use: Yes  . Drug Use: No  . Sexual Activity: Not on file   Other Topics Concern  . Not on file   Social History Narrative  . No  narrative on file     Filed Vitals:   06/08/13 0807  BP: 144/74  Pulse: 65  Height: 6' (1.829 m)  Weight: 224 lb (101.606 kg)    PHYSICAL EXAM General: NAD Neck: No JVD, no thyromegaly or thyroid nodule.  Lungs: Clear to auscultation bilaterally with normal respiratory effort. CV: Nondisplaced PMI.  Heart regular S1/S2, no S3/S4, no murmur.  No peripheral edema.  No carotid bruit.  Normal pedal pulses.  Abdomen: Soft, nontender, no hepatosplenomegaly, no distention.  Neurologic: Alert and oriented x 3.  Psych: Normal affect. Extremities: No clubbing or cyanosis.   ECG: reviewed and available in electronic records.      ASSESSMENT AND PLAN: 1. CAD: goal is to control symptoms with optimal medical therapy. I will increase Coreg to 6.25 mg bid. I offered to increase his Imdur but he says the chest pain is barely noticeable. For this reason, I will continue it at 30 mg daily. 2. HTN: still not optimally controlled. Increase Coreg to 6.25 mg bid. He will continue to monitor this at home. I've asked him to drop  off a BP log in one month to see if further medication titration is indicated.  Dispo: f/u 6 months.    Kate Sable, M.D., F.A.C.C.

## 2013-06-08 NOTE — Telephone Encounter (Signed)
Pt discussed bp readings with Dr. Bronson Ing this am during his office visit, pt home bp monitor compared to the office monitor and noted concurrent with office monitor, addressed with Dr. Bronson Ing

## 2013-06-20 ENCOUNTER — Ambulatory Visit: Payer: Medicare Other | Admitting: Cardiovascular Disease

## 2013-06-27 DIAGNOSIS — H409 Unspecified glaucoma: Secondary | ICD-10-CM | POA: Diagnosis not present

## 2013-06-27 DIAGNOSIS — H4011X0 Primary open-angle glaucoma, stage unspecified: Secondary | ICD-10-CM | POA: Diagnosis not present

## 2013-07-11 ENCOUNTER — Telehealth: Payer: Self-pay

## 2013-07-11 MED ORDER — AMLODIPINE BESYLATE 5 MG PO TABS: 5.0000 mg | ORAL_TABLET | Freq: Every day | ORAL | Status: DC

## 2013-07-11 NOTE — Telephone Encounter (Signed)
Start Norvasc 5 mg daily - pt aware-sent bp recordings by pt to be scanned 90 day supply to [facility]

## 2013-07-18 ENCOUNTER — Telehealth: Payer: Self-pay | Admitting: Family Medicine

## 2013-07-18 NOTE — Telephone Encounter (Signed)
error 

## 2013-07-31 ENCOUNTER — Encounter: Payer: Self-pay | Admitting: Family Medicine

## 2013-07-31 ENCOUNTER — Ambulatory Visit (INDEPENDENT_AMBULATORY_CARE_PROVIDER_SITE_OTHER): Payer: Medicare Other | Admitting: Family Medicine

## 2013-07-31 VITALS — BP 124/72 | Ht 72.0 in | Wt 225.0 lb

## 2013-07-31 DIAGNOSIS — I10 Essential (primary) hypertension: Secondary | ICD-10-CM

## 2013-07-31 DIAGNOSIS — E785 Hyperlipidemia, unspecified: Secondary | ICD-10-CM | POA: Diagnosis not present

## 2013-07-31 DIAGNOSIS — E119 Type 2 diabetes mellitus without complications: Secondary | ICD-10-CM | POA: Diagnosis not present

## 2013-07-31 LAB — POCT GLYCOSYLATED HEMOGLOBIN (HGB A1C): Hemoglobin A1C: 7.5

## 2013-07-31 MED ORDER — CANAGLIFLOZIN 100 MG PO TABS: ORAL_TABLET | ORAL | Status: DC

## 2013-07-31 NOTE — Progress Notes (Signed)
   Subjective:    Patient ID: Jerry Burns, male    DOB: [birth year], 80 y.o.   MRN: [id number]  Diabetes He presents for his follow-up diabetic visit. He has type 2 diabetes mellitus. His disease course has been worsening. There are no hypoglycemic associated symptoms. Pertinent negatives for hypoglycemia include no confusion or dizziness. Associated symptoms include polydipsia and polyuria. Pertinent negatives for diabetes include no blurred vision, no chest pain, no fatigue, no polyphagia, no visual change, no weakness and no weight loss. Symptoms are stable. Risk factors for coronary artery disease include diabetes mellitus, dyslipidemia and family history. Current diabetic treatment includes diet and insulin injections.  Diabetic check up. Patient brought in blood sugar readings and blood pressure readings. Concerns about blood sugar.  PMH benign   Review of Systems  Constitutional: Negative for weight loss and fatigue.  Eyes: Negative for blurred vision.  Cardiovascular: Negative for chest pain.  Endocrine: Positive for polydipsia and polyuria. Negative for polyphagia.  Neurological: Negative for dizziness and weakness.  Psychiatric/Behavioral: Negative for confusion.       Objective:   Physical Exam Lungs clear heart regular pulse normal extremities no edema diabetic foot exam completed       Assessment & Plan:  #1 diabetes add Invokana 100 mg daily. Followup A1c in 3 months #2 HTN stable #3 hyperlipidemia stable #4 her issues stable

## 2013-07-31 NOTE — Addendum Note (Signed)
Addended by: Carmelina Noun on: 07/31/2013 05:35 PM   Modules accepted: Orders

## 2013-08-30 ENCOUNTER — Other Ambulatory Visit: Payer: Self-pay | Admitting: *Deleted

## 2013-08-30 MED ORDER — METFORMIN HCL 1000 MG PO TABS: 1000.0000 mg | ORAL_TABLET | Freq: Two times a day (BID) | ORAL | Status: DC

## 2013-08-30 MED ORDER — QUINAPRIL HCL 40 MG PO TABS: 40.0000 mg | ORAL_TABLET | Freq: Every day | ORAL | Status: DC

## 2013-08-30 MED ORDER — ATORVASTATIN CALCIUM 40 MG PO TABS: 40.0000 mg | ORAL_TABLET | Freq: Every day | ORAL | Status: DC

## 2013-08-30 MED ORDER — TAMSULOSIN HCL 0.4 MG PO CAPS: 0.4000 mg | ORAL_CAPSULE | Freq: Every day | ORAL | Status: DC

## 2013-09-07 ENCOUNTER — Other Ambulatory Visit: Payer: Self-pay | Admitting: *Deleted

## 2013-09-07 MED ORDER — DUTASTERIDE 0.5 MG PO CAPS: 0.5000 mg | ORAL_CAPSULE | Freq: Every day | ORAL | Status: DC

## 2013-09-15 ENCOUNTER — Other Ambulatory Visit: Payer: Self-pay | Admitting: *Deleted

## 2013-09-15 MED ORDER — DUTASTERIDE 0.5 MG PO CAPS: 0.5000 mg | ORAL_CAPSULE | Freq: Every day | ORAL | Status: DC

## 2013-10-02 ENCOUNTER — Telehealth: Payer: Self-pay | Admitting: Cardiovascular Disease

## 2013-10-02 MED ORDER — AMLODIPINE BESYLATE 5 MG PO TABS: 5.0000 mg | ORAL_TABLET | Freq: Every day | ORAL | Status: DC

## 2013-10-02 NOTE — Telephone Encounter (Signed)
Medication sent via escribe.  

## 2013-10-02 NOTE — Telephone Encounter (Signed)
Received fax refill request  Rx # [id number] Medication:  Amlodipine Besylate 5 mg tab Qty 90 Sig:  Take one tablet by mouth once daily Physician:  Bronson Ing

## 2013-10-10 DIAGNOSIS — H4011X0 Primary open-angle glaucoma, stage unspecified: Secondary | ICD-10-CM | POA: Diagnosis not present

## 2013-10-10 DIAGNOSIS — H409 Unspecified glaucoma: Secondary | ICD-10-CM | POA: Diagnosis not present

## 2013-10-10 DIAGNOSIS — Z961 Presence of intraocular lens: Secondary | ICD-10-CM | POA: Diagnosis not present

## 2013-10-10 DIAGNOSIS — E119 Type 2 diabetes mellitus without complications: Secondary | ICD-10-CM | POA: Diagnosis not present

## 2013-10-26 DIAGNOSIS — E785 Hyperlipidemia, unspecified: Secondary | ICD-10-CM | POA: Diagnosis not present

## 2013-10-26 DIAGNOSIS — I10 Essential (primary) hypertension: Secondary | ICD-10-CM | POA: Diagnosis not present

## 2013-10-26 DIAGNOSIS — E119 Type 2 diabetes mellitus without complications: Secondary | ICD-10-CM | POA: Diagnosis not present

## 2013-10-26 LAB — BASIC METABOLIC PANEL
BUN: 20 mg/dL (ref 6–23)
CHLORIDE: 103 meq/L (ref 96–112)
CO2: 28 mEq/L (ref 19–32)
Calcium: 9.7 mg/dL (ref 8.4–10.5)
Creat: 0.89 mg/dL (ref 0.50–1.35)
Glucose, Bld: 89 mg/dL (ref 70–99)
POTASSIUM: 5 meq/L (ref 3.5–5.3)
SODIUM: 138 meq/L (ref 135–145)

## 2013-10-26 LAB — LIPID PANEL
CHOL/HDL RATIO: 2.6 ratio
Cholesterol: 132 mg/dL (ref 0–200)
HDL: 51 mg/dL (ref >39)
LDL CALC: 61 mg/dL (ref 0–99)
TRIGLYCERIDES: 99 mg/dL (ref <150)
VLDL: 20 mg/dL (ref 0–40)

## 2013-10-26 LAB — HEPATIC FUNCTION PANEL
ALT: 15 U/L (ref 0–53)
AST: 21 U/L (ref 0–37)
Albumin: 4.3 g/dL (ref 3.5–5.2)
Alkaline Phosphatase: 68 U/L (ref 39–117)
BILIRUBIN INDIRECT: 0.5 mg/dL (ref 0.2–1.2)
Bilirubin, Direct: 0.2 mg/dL (ref 0.0–0.3)
TOTAL PROTEIN: 6.7 g/dL (ref 6.0–8.3)
Total Bilirubin: 0.7 mg/dL (ref 0.2–1.2)

## 2013-10-26 LAB — HEMOGLOBIN A1C
Hgb A1c MFr Bld: 7.3 % — ABNORMAL HIGH (ref <5.7)
MEAN PLASMA GLUCOSE: 163 mg/dL — AB (ref <117)

## 2013-10-27 LAB — MICROALBUMIN, URINE: MICROALB UR: 2.21 mg/dL — AB (ref 0.00–1.89)

## 2013-10-30 ENCOUNTER — Ambulatory Visit (INDEPENDENT_AMBULATORY_CARE_PROVIDER_SITE_OTHER): Payer: Medicare Other | Admitting: Family Medicine

## 2013-10-30 ENCOUNTER — Encounter: Payer: Self-pay | Admitting: Family Medicine

## 2013-10-30 VITALS — BP 118/72 | Ht 72.0 in | Wt 221.0 lb

## 2013-10-30 DIAGNOSIS — I10 Essential (primary) hypertension: Secondary | ICD-10-CM

## 2013-10-30 DIAGNOSIS — E785 Hyperlipidemia, unspecified: Secondary | ICD-10-CM

## 2013-10-30 DIAGNOSIS — E1149 Type 2 diabetes mellitus with other diabetic neurological complication: Secondary | ICD-10-CM

## 2013-10-30 NOTE — Progress Notes (Signed)
   Subjective:    Patient ID: Jerry Burns, male    DOB: [birth year], 80 y.o.   MRN: [id number]  Diabetes He presents for his follow-up diabetic visit. He has type 2 diabetes mellitus. There are no hypoglycemic associated symptoms. Pertinent negatives for hypoglycemia include no confusion. There are no diabetic associated symptoms. Pertinent negatives for diabetes include no fatigue, no polydipsia, no polyphagia and no weakness. Current diabetic treatment includes insulin injections and oral agent (monotherapy). He is compliant with treatment all of the time. He participates in exercise daily.   Patient is trying to eat healthy. He has a little bit confused about how she is adjust his insulin. We discussed this at length. He denies any headaches chest pain shortness breath denies swelling in the legs denies hematuria or hematochezia he states he keeps up-to-date on eye exams.   Review of Systems  Constitutional: Negative for activity change, appetite change and fatigue.  Endocrine: Negative for polydipsia and polyphagia.  Neurological: Negative for weakness.  Psychiatric/Behavioral: Negative for confusion.       Objective:   Physical Exam  Vitals reviewed. Constitutional: He appears well-nourished. No distress.  Cardiovascular: Normal rate, regular rhythm and normal heart sounds.   No murmur heard. Pulmonary/Chest: Effort normal and breath sounds normal. No respiratory distress.  Musculoskeletal: He exhibits no edema.  Lymphadenopathy:    He has no cervical adenopathy.  Neurological: He is alert.  Psychiatric: His behavior is normal.          Assessment & Plan:  #1 diabetes-A1c a little bit better than what it was but still not quite where I wanted to be. I encouraged him watch diet better we also discussed how to adjust his long-acting insulin based around morning sugar readings not his evening readings. He seemed to understand after signifiant discussion #2 current measures.  hyperlipidemia actually much better continue current measures  #3 HTN very good continue medications

## 2013-11-20 ENCOUNTER — Encounter: Payer: Self-pay | Admitting: Family Medicine

## 2013-12-06 ENCOUNTER — Ambulatory Visit (INDEPENDENT_AMBULATORY_CARE_PROVIDER_SITE_OTHER): Payer: Medicare Other | Admitting: Cardiovascular Disease

## 2013-12-06 ENCOUNTER — Encounter: Payer: Self-pay | Admitting: Cardiovascular Disease

## 2013-12-06 VITALS — BP 110/58 | HR 72 | Ht 71.0 in | Wt 222.0 lb

## 2013-12-06 DIAGNOSIS — I2510 Atherosclerotic heart disease of native coronary artery without angina pectoris: Secondary | ICD-10-CM | POA: Diagnosis not present

## 2013-12-06 DIAGNOSIS — I10 Essential (primary) hypertension: Secondary | ICD-10-CM

## 2013-12-06 MED ORDER — CARVEDILOL 6.25 MG PO TABS: 6.2500 mg | ORAL_TABLET | Freq: Two times a day (BID) | ORAL | Status: DC

## 2013-12-06 MED ORDER — ISOSORBIDE MONONITRATE ER 30 MG PO TB24: 30.0000 mg | ORAL_TABLET | Freq: Every day | ORAL | Status: DC

## 2013-12-06 NOTE — Patient Instructions (Signed)
Your physician recommends that you schedule a follow-up appointment in: 6 months with Dr Koneswaran You will receive a reminder letter two months in advance reminding you to call and schedule your appointment. If you don't receive this letter, please contact our office.  Your physician recommends that you continue on your current medications as directed. Please refer to the Current Medication list given to you today.   

## 2013-12-06 NOTE — Progress Notes (Signed)
Patient ID: CADELL HEYSER, male   DOB: 03/13/1934, 80 y.o.   MRN: [id number]      SUBJECTIVE: [name]") is here to followup for coronary artery disease and hypertension. Coronary angiography in August 2014 demonstrated distal LAD and RCA disease. He denies chest pain, shortness of breath, leg swelling, palpitations, orthopnea and PND. He has been checking his blood pressure twice daily 7 days a week. He said he gets very impatient and quickly checks his blood pressure without sitting comfortably for 5 minutes. He may also be watching TV or doing other things which can provoke emotional responses. We learned today that his blood pressure cuff is not appropriately calibrated. Systolic readings have consistently been in the 140-160 range. He feels quite well.    Allergies  Allergen Reactions  . Latex Other (See Comments)    bandaids cause blisters after 24 hours     Current Outpatient Prescriptions  Medication Sig Dispense Refill  . amLODipine (NORVASC) 5 MG tablet Take 1 tablet (5 mg total) by mouth daily.  90 tablet  3  . aspirin 325 MG tablet Take 325 mg by mouth daily.      Marland Kitchen atorvastatin (LIPITOR) 40 MG tablet Take 1 tablet (40 mg total) by mouth daily.  90 tablet  1  . BAYER CONTOUR TEST test strip USE ONE STRIP TO TEST BLOOD SUGAR EVERY DAY AS DIRECTED  50 each  0  . Canagliflozin 100 MG TABS Take one tablet every day  90 tablet  0  . carvedilol (COREG) 6.25 MG tablet Take 1 tablet (6.25 mg total) by mouth 2 (two) times daily.  180 tablet  3  . dutasteride (AVODART) 0.5 MG capsule Take 1 capsule (0.5 mg total) by mouth daily.  90 capsule  0  . insulin glargine (LANTUS) 100 UNIT/ML injection Inject 31-34 Units into the skin at bedtime.      . isosorbide mononitrate (IMDUR) 30 MG 24 hr tablet Take 1 tablet (30 mg total) by mouth daily.  90 tablet  3  . metFORMIN (GLUCOPHAGE) 1000 MG tablet Take 1 tablet (1,000 mg total) by mouth 2 (two) times daily with a meal.  180 tablet   1  . Multiple Vitamin (MULTIVITAMIN) tablet Take 1 tablet by mouth daily.      . quinapril (ACCUPRIL) 40 MG tablet Take 1 tablet (40 mg total) by mouth at bedtime.  90 tablet  1  . RELION MINI PEN NEEDLES 31G X 6 MM MISC USE AS DIRECTED  100 each  0  . tamsulosin (FLOMAX) 0.4 MG CAPS capsule Take 1 capsule (0.4 mg total) by mouth daily.  90 capsule  1   No current facility-administered medications for this visit.    Past Medical History  Diagnosis Date  . Diabetes mellitus   . Hyperlipidemia   . Stroke   . Arthritis     Bilateral knee    Past Surgical History  Procedure Laterality Date  . Tonsillectomy    . Appendectomy    . Shoulder surgery    . Colonoscopy      History   Social History  . Marital Status: Married    Spouse Name: N/A    Number of Children: N/A  . Years of Education: 14   Occupational History  . retired    Social History Main Topics  . Smoking status: Former Smoker -- 20 years    Types: Cigarettes, Cigars    Quit date: 07/28/2007  . Smokeless tobacco: Not  on file     Comment: smoke 1-2 cigars a week. Starting to 2005 he only smoked 5 cigars a year.  . Alcohol Use: Yes  . Drug Use: No  . Sexual Activity: Not on file   Other Topics Concern  . Not on file   Social History Narrative  . No narrative on file    BP 110/58 Pulse 72    PHYSICAL EXAM General: NAD Neck: No JVD, no thyromegaly. Lungs: Clear to auscultation bilaterally with normal respiratory effort. CV: Nondisplaced PMI.  Regular rate and rhythm, normal S1/S2, no S3/S4, no murmur. No pretibial or periankle edema.  No carotid bruit.  Normal pedal pulses.  Abdomen: Soft, nontender, no hepatosplenomegaly, no distention.  Neurologic: Alert and oriented x 3.  Psych: Normal affect. Extremities: No clubbing or cyanosis.   ECG: reviewed and available in electronic records.      ASSESSMENT AND PLAN: 1. CAD: Symptomatically stable on Coreg t6.25 mg bid and Imdur 30 mg daily.  2.  HTN: It appears to be elevated at home but his blood pressure cuff is not appropriately calibrated. I instructed him on the proper method of checking blood pressures. I told him to rest comfortably for 5 minutes without participating in activities which could potentially provoke an emotional response, thus artificially elevating his blood pressure. I have also instructed him to take it 4 times a week rather than twice daily 7 days a week. He will record measurements for the next 4 weeks and then return these to me so that I can decide whether further antihypertensive titration is warranted.  Dispo: f/u 6 months.    Kate Sable, M.D., F.A.C.C.

## 2014-01-09 DIAGNOSIS — H4011X0 Primary open-angle glaucoma, stage unspecified: Secondary | ICD-10-CM | POA: Diagnosis not present

## 2014-01-09 DIAGNOSIS — H409 Unspecified glaucoma: Secondary | ICD-10-CM | POA: Diagnosis not present

## 2014-01-19 ENCOUNTER — Other Ambulatory Visit: Payer: Self-pay | Admitting: *Deleted

## 2014-01-19 MED ORDER — INSULIN GLARGINE 100 UNIT/ML ~~LOC~~ SOLN: 31.0000 [IU] | Freq: Every day | SUBCUTANEOUS | Status: DC

## 2014-01-19 MED ORDER — TAMSULOSIN HCL 0.4 MG PO CAPS: 0.4000 mg | ORAL_CAPSULE | Freq: Every day | ORAL | Status: DC

## 2014-01-19 MED ORDER — ATORVASTATIN CALCIUM 40 MG PO TABS: 40.0000 mg | ORAL_TABLET | Freq: Every day | ORAL | Status: DC

## 2014-01-19 MED ORDER — DUTASTERIDE 0.5 MG PO CAPS: 0.5000 mg | ORAL_CAPSULE | Freq: Every day | ORAL | Status: DC

## 2014-01-19 MED ORDER — QUINAPRIL HCL 40 MG PO TABS: 40.0000 mg | ORAL_TABLET | Freq: Every day | ORAL | Status: DC

## 2014-01-19 MED ORDER — METFORMIN HCL 1000 MG PO TABS: 1000.0000 mg | ORAL_TABLET | Freq: Two times a day (BID) | ORAL | Status: DC

## 2014-01-29 ENCOUNTER — Ambulatory Visit: Payer: Medicare Other | Admitting: Family Medicine

## 2014-01-30 ENCOUNTER — Ambulatory Visit (INDEPENDENT_AMBULATORY_CARE_PROVIDER_SITE_OTHER): Payer: Medicare Other | Admitting: Family Medicine

## 2014-01-30 ENCOUNTER — Encounter: Payer: Self-pay | Admitting: Family Medicine

## 2014-01-30 ENCOUNTER — Ambulatory Visit: Payer: Medicare Other | Admitting: Family Medicine

## 2014-01-30 VITALS — BP 130/68 | Ht 72.0 in | Wt 222.4 lb

## 2014-01-30 DIAGNOSIS — E1149 Type 2 diabetes mellitus with other diabetic neurological complication: Secondary | ICD-10-CM

## 2014-01-30 DIAGNOSIS — E119 Type 2 diabetes mellitus without complications: Secondary | ICD-10-CM | POA: Diagnosis not present

## 2014-01-30 DIAGNOSIS — I10 Essential (primary) hypertension: Secondary | ICD-10-CM | POA: Diagnosis not present

## 2014-01-30 DIAGNOSIS — Z23 Encounter for immunization: Secondary | ICD-10-CM

## 2014-01-30 DIAGNOSIS — I2510 Atherosclerotic heart disease of native coronary artery without angina pectoris: Secondary | ICD-10-CM

## 2014-01-30 LAB — POCT GLYCOSYLATED HEMOGLOBIN (HGB A1C): HEMOGLOBIN A1C: 6.9

## 2014-01-30 MED ORDER — METFORMIN HCL 1000 MG PO TABS: 1000.0000 mg | ORAL_TABLET | Freq: Two times a day (BID) | ORAL | Status: DC

## 2014-01-30 MED ORDER — QUINAPRIL HCL 40 MG PO TABS: 40.0000 mg | ORAL_TABLET | Freq: Every day | ORAL | Status: DC

## 2014-01-30 NOTE — Progress Notes (Signed)
   Subjective:    Patient ID: Jerry Burns, male    DOB: [birth year], 80 y.o.   MRN: [id number]  Diabetes He presents for his follow-up diabetic visit. He has type 2 diabetes mellitus. His disease course has been stable. There are no hypoglycemic associated symptoms. Pertinent negatives for hypoglycemia include no confusion. There are no diabetic associated symptoms. Pertinent negatives for diabetes include no chest pain, no fatigue, no polydipsia, no polyphagia and no weakness. There are no hypoglycemic complications. Symptoms are stable. There are no diabetic complications. There are no known risk factors for coronary artery disease. Current diabetic treatment includes oral agent (monotherapy) and insulin injections. He is compliant with treatment all of the time.   Patient states he has a blister on his toe on his right foot that he would like the doctor to look at.    Review of Systems  Constitutional: Negative for activity change, appetite change and fatigue.  HENT: Negative for congestion.   Respiratory: Negative for cough.   Cardiovascular: Negative for chest pain.  Gastrointestinal: Negative for abdominal pain.  Endocrine: Negative for polydipsia and polyphagia.  Neurological: Negative for weakness.  Psychiatric/Behavioral: Negative for confusion.       Objective:   Physical Exam  Vitals reviewed. Constitutional: He appears well-nourished. No distress.  Cardiovascular: Normal rate, regular rhythm and normal heart sounds.   No murmur heard. Pulmonary/Chest: Effort normal and breath sounds normal. No respiratory distress.  Musculoskeletal: He exhibits no edema.  Lymphadenopathy:    He has no cervical adenopathy.  Neurological: He is alert.  Psychiatric: His behavior is normal.          Assessment & Plan:  HTN good control Diabetes good control Patient encourage the healthy stay physically active comprehensive lab work and followup in 3 months

## 2014-02-01 ENCOUNTER — Telehealth: Payer: Self-pay | Admitting: Family Medicine

## 2014-02-01 MED ORDER — SULFACETAMIDE SODIUM 10 % OP SOLN: OPHTHALMIC | Status: DC

## 2014-02-01 NOTE — Telephone Encounter (Signed)
Pink eye protocol followed. Pt notified.

## 2014-02-01 NOTE — Telephone Encounter (Signed)
Watery eyes, no discharge, pinkish red color to the eye, eye lids itchy   No known allergy to Sulfa   Can we call in something to  [facility]

## 2014-02-06 ENCOUNTER — Telehealth: Payer: Self-pay | Admitting: Family Medicine

## 2014-02-06 NOTE — Telephone Encounter (Signed)
error 

## 2014-02-06 NOTE — Telephone Encounter (Signed)
Please see attached note on patient's chart about his high glucose readings.

## 2014-02-08 NOTE — Telephone Encounter (Signed)
Card mailed 

## 2014-02-08 NOTE — Telephone Encounter (Signed)
These results were reviewed continue everything as is the last A1c looking good keep regular followup appointment, may send a card to that affect

## 2014-04-23 ENCOUNTER — Telehealth: Payer: Self-pay | Admitting: Family Medicine

## 2014-04-23 DIAGNOSIS — Z79899 Other long term (current) drug therapy: Secondary | ICD-10-CM

## 2014-04-23 DIAGNOSIS — E119 Type 2 diabetes mellitus without complications: Secondary | ICD-10-CM

## 2014-04-23 DIAGNOSIS — E782 Mixed hyperlipidemia: Secondary | ICD-10-CM

## 2014-04-23 NOTE — Telephone Encounter (Signed)
Notified patient bloodwork has been ordered.  

## 2014-04-23 NOTE — Telephone Encounter (Signed)
Lipid/liv/met 7/A1C

## 2014-04-23 NOTE — Telephone Encounter (Signed)
bw orders for appt on 10/12  Call pt when sent

## 2014-04-23 NOTE — Telephone Encounter (Signed)
Last labs 10/26/13  Lipid, Hep Func, A1C, BMP Microalbumin, Urine

## 2014-04-25 DIAGNOSIS — E119 Type 2 diabetes mellitus without complications: Secondary | ICD-10-CM | POA: Diagnosis not present

## 2014-04-25 DIAGNOSIS — Z79899 Other long term (current) drug therapy: Secondary | ICD-10-CM | POA: Diagnosis not present

## 2014-04-25 DIAGNOSIS — E782 Mixed hyperlipidemia: Secondary | ICD-10-CM | POA: Diagnosis not present

## 2014-04-25 LAB — HEPATIC FUNCTION PANEL
ALK PHOS: 63 U/L (ref 39–117)
ALT: 13 U/L (ref 0–53)
AST: 18 U/L (ref 0–37)
Albumin: 4 g/dL (ref 3.5–5.2)
Bilirubin, Direct: 0.1 mg/dL (ref 0.0–0.3)
Indirect Bilirubin: 0.4 mg/dL (ref 0.2–1.2)
TOTAL PROTEIN: 6.4 g/dL (ref 6.0–8.3)
Total Bilirubin: 0.5 mg/dL (ref 0.2–1.2)

## 2014-04-25 LAB — BASIC METABOLIC PANEL
BUN: 20 mg/dL (ref 6–23)
CO2: 25 mEq/L (ref 19–32)
CREATININE: 0.93 mg/dL (ref 0.50–1.35)
Calcium: 9.3 mg/dL (ref 8.4–10.5)
Chloride: 105 mEq/L (ref 96–112)
GLUCOSE: 132 mg/dL — AB (ref 70–99)
POTASSIUM: 4.9 meq/L (ref 3.5–5.3)
Sodium: 140 mEq/L (ref 135–145)

## 2014-04-25 LAB — LIPID PANEL
CHOLESTEROL: 136 mg/dL (ref 0–200)
HDL: 49 mg/dL (ref >39)
LDL Cholesterol: 70 mg/dL (ref 0–99)
TRIGLYCERIDES: 86 mg/dL (ref <150)
Total CHOL/HDL Ratio: 2.8 Ratio
VLDL: 17 mg/dL (ref 0–40)

## 2014-04-25 LAB — HEMOGLOBIN A1C
HEMOGLOBIN A1C: 7.3 % — AB (ref <5.7)
MEAN PLASMA GLUCOSE: 163 mg/dL — AB (ref <117)

## 2014-04-30 DIAGNOSIS — H53461 Homonymous bilateral field defects, right side: Secondary | ICD-10-CM | POA: Diagnosis not present

## 2014-04-30 DIAGNOSIS — H4011X1 Primary open-angle glaucoma, mild stage: Secondary | ICD-10-CM | POA: Diagnosis not present

## 2014-05-07 ENCOUNTER — Ambulatory Visit (INDEPENDENT_AMBULATORY_CARE_PROVIDER_SITE_OTHER): Payer: Medicare Other | Admitting: Family Medicine

## 2014-05-07 ENCOUNTER — Encounter: Payer: Self-pay | Admitting: Family Medicine

## 2014-05-07 VITALS — BP 122/68 | Ht 72.0 in | Wt 218.2 lb

## 2014-05-07 DIAGNOSIS — I10 Essential (primary) hypertension: Secondary | ICD-10-CM | POA: Diagnosis not present

## 2014-05-07 DIAGNOSIS — E119 Type 2 diabetes mellitus without complications: Secondary | ICD-10-CM

## 2014-05-07 DIAGNOSIS — I2510 Atherosclerotic heart disease of native coronary artery without angina pectoris: Secondary | ICD-10-CM | POA: Diagnosis not present

## 2014-05-07 DIAGNOSIS — Z23 Encounter for immunization: Secondary | ICD-10-CM

## 2014-05-07 DIAGNOSIS — E785 Hyperlipidemia, unspecified: Secondary | ICD-10-CM | POA: Diagnosis not present

## 2014-05-07 MED ORDER — GLIPIZIDE 2.5 MG HALF TABLET: ORAL_TABLET | ORAL | Status: DC

## 2014-05-07 NOTE — Progress Notes (Signed)
   Subjective:    Patient ID: Jerry Burns, male    DOB: 1934/01/30, 80 y.o.   MRN: [id number]  Diabetes He presents for his follow-up diabetic visit. He has type 2 diabetes mellitus. His disease course has been stable. There are no hypoglycemic associated symptoms. Pertinent negatives for hypoglycemia include no confusion. There are no diabetic associated symptoms. Pertinent negatives for diabetes include no chest pain, no fatigue, no polydipsia, no polyphagia and no weakness. There are no hypoglycemic complications. Symptoms are stable. There are no diabetic complications. There are no known risk factors for coronary artery disease. Current diabetic treatment includes insulin injections and oral agent (dual therapy). He is compliant with treatment all of the time.  Patient has completed his bloodwork and results are in the system.   Patient states that he has no concerns at this time.     Review of Systems  Constitutional: Negative for activity change, appetite change and fatigue.  HENT: Negative for congestion.   Respiratory: Negative for cough.   Cardiovascular: Negative for chest pain.  Gastrointestinal: Negative for abdominal pain.  Endocrine: Negative for polydipsia and polyphagia.  Neurological: Negative for weakness.  Psychiatric/Behavioral: Negative for confusion.       Objective:   Physical Exam  Vitals reviewed. Constitutional: He appears well-nourished. No distress.  Cardiovascular: Normal rate, regular rhythm and normal heart sounds.   No murmur heard. Pulmonary/Chest: Effort normal and breath sounds normal. No respiratory distress.  Musculoskeletal: He exhibits no edema.  Lymphadenopathy:    He has no cervical adenopathy.  Neurological: He is alert.  Psychiatric: His behavior is normal.      Diabetic foot exam completed    Assessment & Plan:  Diabetes subpar adjustments on medication watch diet closely To stay physically active Blood pressure  good Hyperlipidemia under good control Significant time was spent with the patient discussing diet activity exercise. Greater than 20-25 minutes spent with patient Followup in approximately 3-4 months

## 2014-05-10 ENCOUNTER — Telehealth: Payer: Self-pay | Admitting: *Deleted

## 2014-05-22 ENCOUNTER — Other Ambulatory Visit: Payer: Self-pay | Admitting: Cardiovascular Disease

## 2014-05-27 ENCOUNTER — Other Ambulatory Visit: Payer: Self-pay

## 2014-05-27 LAB — HM DIABETES EYE EXAM

## 2014-05-27 MED ORDER — ISOSORBIDE MONONITRATE ER 30 MG PO TB24: 30.0000 mg | ORAL_TABLET | Freq: Every day | ORAL | Status: DC

## 2014-05-28 DIAGNOSIS — H4011X1 Primary open-angle glaucoma, mild stage: Secondary | ICD-10-CM | POA: Diagnosis not present

## 2014-06-05 ENCOUNTER — Other Ambulatory Visit: Payer: Self-pay | Admitting: *Deleted

## 2014-06-05 MED ORDER — GLIPIZIDE 5 MG PO TABS: 2.5000 mg | ORAL_TABLET | Freq: Every day | ORAL | Status: DC

## 2014-06-07 ENCOUNTER — Ambulatory Visit (INDEPENDENT_AMBULATORY_CARE_PROVIDER_SITE_OTHER): Payer: Medicare Other | Admitting: Cardiovascular Disease

## 2014-06-07 ENCOUNTER — Encounter: Payer: Self-pay | Admitting: Cardiovascular Disease

## 2014-06-07 VITALS — BP 112/56 | HR 55 | Ht 72.0 in | Wt 219.0 lb

## 2014-06-07 DIAGNOSIS — E785 Hyperlipidemia, unspecified: Secondary | ICD-10-CM | POA: Diagnosis not present

## 2014-06-07 DIAGNOSIS — I10 Essential (primary) hypertension: Secondary | ICD-10-CM | POA: Diagnosis not present

## 2014-06-07 DIAGNOSIS — I2510 Atherosclerotic heart disease of native coronary artery without angina pectoris: Secondary | ICD-10-CM

## 2014-06-07 NOTE — Patient Instructions (Signed)
Your physician wants you to follow-up in: 1 year You will receive a reminder letter in the mail two months in advance. If you don't receive a letter, please call our office to schedule the follow-up appointment.    Your physician recommends that you continue on your current medications as directed. Please refer to the Current Medication list given to you today.     Thank you for choosing  [facility] !  

## 2014-06-07 NOTE — Progress Notes (Signed)
Patient ID: Jerry Burns, male   DOB: November 04, 1933, 80 y.o.   MRN: [id number]      SUBJECTIVE: The patient returns for follow up regarding distal small vessel CAD and hypertension. The patient denies any symptoms of chest pain, palpitations, shortness of breath, lightheadedness, dizziness, leg swelling, orthopnea, PND, and syncope. ECG performed in the office today demonstrates sinus bradycardia, heart rate 55 bpm, with a left anterior fascicular block.  Review of Systems: As per "subjective", otherwise negative.  Allergies  Allergen Reactions  . Latex Other (See Comments)    bandaids cause blisters after 24 hours     Current Outpatient Prescriptions  Medication Sig Dispense Refill  . amLODipine (NORVASC) 5 MG tablet Take 1 tablet (5 mg total) by mouth daily. 90 tablet 3  . aspirin 81 MG tablet Take 81 mg by mouth daily.    Marland Kitchen atorvastatin (LIPITOR) 40 MG tablet Take 1 tablet (40 mg total) by mouth daily. 90 tablet 1  . BAYER CONTOUR TEST test strip USE ONE STRIP TO TEST BLOOD SUGAR EVERY DAY AS DIRECTED 50 each 0  . carvedilol (COREG) 6.25 MG tablet TAKE (1) TABLET BY MOUTH TWICE DAILY. 60 tablet 3  . dutasteride (AVODART) 0.5 MG capsule Take 1 capsule (0.5 mg total) by mouth daily. 90 capsule 1  . glipiZIDE (GLUCOTROL) 5 MG tablet Take 0.5 tablets (2.5 mg total) by mouth daily before breakfast. 15 tablet 0  . insulin glargine (LANTUS) 100 UNIT/ML injection Inject 23 Units into the skin at bedtime.    . isosorbide mononitrate (IMDUR) 30 MG 24 hr tablet Take 1 tablet (30 mg total) by mouth daily. 90 tablet 0  . metFORMIN (GLUCOPHAGE) 1000 MG tablet Take 1 tablet (1,000 mg total) by mouth 2 (two) times daily with a meal. 180 tablet 3  . Multiple Vitamin (MULTIVITAMIN) tablet Take 1 tablet by mouth daily.    . quinapril (ACCUPRIL) 40 MG tablet Take 1 tablet (40 mg total) by mouth at bedtime. 90 tablet 0  . RELION MINI PEN NEEDLES 31G X 6 MM MISC USE AS DIRECTED 100 each 0  . tamsulosin  (FLOMAX) 0.4 MG CAPS capsule Take 1 capsule (0.4 mg total) by mouth daily. 90 capsule 1   No current facility-administered medications for this visit.    Past Medical History  Diagnosis Date  . Diabetes mellitus   . Hyperlipidemia   . Stroke   . Arthritis     Bilateral knee    Past Surgical History  Procedure Laterality Date  . Tonsillectomy    . Appendectomy    . Shoulder surgery    . Colonoscopy      History   Social History  . Marital Status: Married    Spouse Name: N/A    Number of Children: N/A  . Years of Education: 14   Occupational History  . retired    Social History Main Topics  . Smoking status: Former Smoker -- 20 years    Types: Cigarettes, Cigars    Quit date: 07/28/2007  . Smokeless tobacco: Not on file     Comment: smoke 1-2 cigars a week. Starting to 2005 he only smoked 5 cigars a year.  . Alcohol Use: Yes  . Drug Use: No  . Sexual Activity: Not on file   Other Topics Concern  . Not on file   Social History Narrative     Filed Vitals:   06/07/14 0825  BP: 112/56  Pulse: 55  Height: 6' (1.829 m)  Weight: 219 lb (99.338 kg)    PHYSICAL EXAM General: NAD HEENT: Normal. Neck: No JVD, no thyromegaly. Lungs: Clear to auscultation bilaterally with normal respiratory effort. CV: Nondisplaced PMI.  Regular rate and rhythm, normal S1/S2, no S3/S4, no murmur. No pretibial or periankle edema.  No carotid bruit.  Normal pedal pulses.  Abdomen: Soft, nontender, no hepatosplenomegaly, no distention.  Neurologic: Alert and oriented x 3.  Psych: Normal affect. Skin: Normal. Musculoskeletal: Normal range of motion, no gross deformities. Extremities: No clubbing or cyanosis.   ECG: Most recent ECG reviewed.      ASSESSMENT AND PLAN: 1. CAD: Stable ischemic heart disease. Continue ASA, Coreg, Imdur, and Lipitor. 2. Essential HTN: Well controlled on current therapy. No changes. 3. Hyperlipidemia: 04/23/14-HDL 49, LDL 70. Well controlled. No  changes, continue Lipitor 40 mg.  Dispo: f/u 1 year.  Kate Sable, M.D., F.A.C.C.

## 2014-06-26 ENCOUNTER — Other Ambulatory Visit: Payer: Self-pay | Admitting: Family Medicine

## 2014-07-23 ENCOUNTER — Other Ambulatory Visit: Payer: Self-pay | Admitting: Family Medicine

## 2014-07-23 ENCOUNTER — Ambulatory Visit (INDEPENDENT_AMBULATORY_CARE_PROVIDER_SITE_OTHER): Payer: Medicare Other | Admitting: Adult Health

## 2014-07-23 ENCOUNTER — Other Ambulatory Visit: Payer: Self-pay | Admitting: Cardiovascular Disease

## 2014-07-23 ENCOUNTER — Encounter: Payer: Self-pay | Admitting: *Deleted

## 2014-07-23 ENCOUNTER — Encounter: Payer: Self-pay | Admitting: Adult Health

## 2014-07-23 ENCOUNTER — Telehealth: Payer: Self-pay | Admitting: Family Medicine

## 2014-07-23 VITALS — BP 152/74 | HR 57 | Ht 72.0 in | Wt 224.0 lb

## 2014-07-23 DIAGNOSIS — I2510 Atherosclerotic heart disease of native coronary artery without angina pectoris: Secondary | ICD-10-CM | POA: Diagnosis not present

## 2014-07-23 DIAGNOSIS — R079 Chest pain, unspecified: Secondary | ICD-10-CM | POA: Insufficient documentation

## 2014-07-23 DIAGNOSIS — E785 Hyperlipidemia, unspecified: Secondary | ICD-10-CM

## 2014-07-23 DIAGNOSIS — I10 Essential (primary) hypertension: Secondary | ICD-10-CM

## 2014-07-23 DIAGNOSIS — E119 Type 2 diabetes mellitus without complications: Secondary | ICD-10-CM

## 2014-07-23 MED ORDER — NITROGLYCERIN 0.4 MG SL SUBL: 0.4000 mg | SUBLINGUAL_TABLET | SUBLINGUAL | Status: DC | PRN

## 2014-07-23 NOTE — Telephone Encounter (Signed)
Lipid, Liver, BMP, A1C done on 04/23/14

## 2014-07-23 NOTE — Patient Instructions (Addendum)
Your physician recommends that you schedule a follow-up appointment AFTER YOUR STRESS TEST WITH DR. Bronson Ing OR Jory Sims, NP  Your physician recommends that you continue on your current medications as directed. Please refer to the Current Medication list given to you today.  WE HAVE ORDERED NITROGLYCERIN. PLEASE TAKE THIS AS NEEDED FOR CHEST PAIN AND TAKE WHEN SITTING DOWN. WE HAVE GONE OVER INSTRUCTIONS FOR THIS MEDICATION.   Your physician recommends that you continue on your current medications as directed. Please refer to the Current Medication list given to you today.  Your physician has requested that you have en exercise stress myoview. For further information please visit [email or web address]. Please follow instruction sheet, as given.  Your physician has requested that you have an echocardiogram. Echocardiography is a painless test that uses sound waves to create images of your heart. It provides your doctor with information about the size and shape of your heart and how well your heart's chambers and valves are working. This procedure takes approximately one hour. There are no restrictions for this procedure.   Thank you for choosing [facility]!!

## 2014-07-23 NOTE — Assessment & Plan Note (Signed)
Slightly elevated today. This is his first visit with me and he is concerned about his new symptoms. Will monitor this on next visit without change in medications at this time.

## 2014-07-23 NOTE — Assessment & Plan Note (Signed)
He has symptoms worrisome for typical angina. EKG does show some EKG changes in the lateral leads, some flattening V3-V6. I will schedule him for NM stress test and echocardiogram. He will continue Imdur 30 mg daily. I will refill his NTG sublingual. Will risk factors of hypertension, diabetes, I am concerned about progression of small vessel CAD. He will come to ER if chest pain occurs at rest or with minimal exertion. He is to be sitting down when he takes NTG SL. He will follow up with me or Dr. Bronson Ing post stress test and echo.,

## 2014-07-23 NOTE — Telephone Encounter (Signed)
Hemoglobin A1c, lipid

## 2014-07-23 NOTE — Telephone Encounter (Signed)
Calling to check if he needs BW done for appointment on 08/08/14?

## 2014-07-23 NOTE — Telephone Encounter (Signed)
Patient notified that bloodwork has been ordered.

## 2014-07-23 NOTE — Progress Notes (Signed)
HPI: Jerry Burns 80 year old patient of Dr. Bronson Ing, we follow for ongoing assessment and management of CAD, distal small vessel, hypertension, with known history of diabetes. He was last seen by Dr. Bronson Ing in November of 2015. At that time. He was stable with no changes in his medication. He is here today as an add-on to my schedule with complaints of chest pain.  He states that he walk 1/2 mile to his gate to open it everyday. He states that the last half of it is up hill at at 7% grade. He has had some chest pain and pressure with walking that his. He states he rests and the pain goes away. It is associated with dyspnea.No racing HR or palpitations, no dizziness. This has happened twice 2 days apart, with no pain walking up the hill in between. He has stopped walking the hill and made appt with cardiology to be checked out.   Allergies  Allergen Reactions  . Latex Other (See Comments)    bandaids cause blisters after 24 hours     Current Outpatient Prescriptions  Medication Sig Dispense Refill  . amLODipine (NORVASC) 5 MG tablet TAKE 1 BY MOUTH DAILY 90 tablet 3  . aspirin 81 MG tablet Take 81 mg by mouth daily.    Marland Kitchen atorvastatin (LIPITOR) 40 MG tablet TAKE 1 BY MOUTH DAILY 90 tablet 0  . BAYER CONTOUR TEST test strip USE ONE STRIP TO TEST BLOOD SUGAR EVERY DAY AS DIRECTED 50 each 0  . carvedilol (COREG) 6.25 MG tablet TAKE (1) TABLET BY MOUTH TWICE DAILY. 60 tablet 3  . dutasteride (AVODART) 0.5 MG capsule Take 1 capsule (0.5 mg total) by mouth daily. 90 capsule 1  . glipiZIDE (GLUCOTROL) 5 MG tablet Take 0.5 tablets (2.5 mg total) by mouth daily before breakfast. 15 tablet 0  . isosorbide mononitrate (IMDUR) 30 MG 24 hr tablet Take 1 tablet (30 mg total) by mouth daily. 90 tablet 0  . LANTUS SOLOSTAR 100 UNIT/ML Solostar Pen INJECT 31 TO 34 UNITS TOTAL INTO THE SKIN AT BEDTIME 30 mL 0  . metFORMIN (GLUCOPHAGE) 1000 MG tablet Take 1 tablet (1,000 mg total) by mouth 2 (two) times  daily with a meal. 180 tablet 3  . Multiple Vitamin (MULTIVITAMIN) tablet Take 1 tablet by mouth daily.    . quinapril (ACCUPRIL) 40 MG tablet TAKE 1 BY MOUTH AT BEDTIME 90 tablet 0  . RELION MINI PEN NEEDLES 31G X 6 MM MISC USE AS DIRECTED 100 each 0  . sulfacetamide (BLEPH-10) 10 % ophthalmic solution INSTILL 4 DROPS INTO THE AFFECTED EYE(S) 4 TIMES DAILY FOR 5 DAYS. 15 mL 0  . tamsulosin (FLOMAX) 0.4 MG CAPS capsule TAKE 1 BY MOUTH DAILY 90 capsule 0   No current facility-administered medications for this visit.    Past Medical History  Diagnosis Date  . Diabetes mellitus   . Hyperlipidemia   . Stroke   . Arthritis     Bilateral knee    Past Surgical History  Procedure Laterality Date  . Tonsillectomy    . Appendectomy    . Shoulder surgery    . Colonoscopy      [id number] review of systems performed and found to be negative unless outlined above  PHYSICAL EXAM BP 152/74 mmHg  Pulse 57  Ht 6' (1.829 m)  Wt 224 lb (101.606 kg)  BMI 30.37 kg/m2  SpO2 96% General: Well developed, well nourished, in no acute distress Head: Eyes PERRLA, No xanthomas.  Normal cephalic and atramatic  Lungs: Clear bilaterally to auscultation and percussion. Heart: HRRR S1 S2, bradycardic, without MRG.  Pulses are 2+ & equal.            No carotid bruit. No JVD.  No abdominal bruits. No femoral bruits. Abdomen: Bowel sounds are positive, abdomen soft and non-tender without masses or                  Hernia's noted. Msk:  Back normal, normal gait. Normal strength and tone for age. Extremities: No clubbing, cyanosis or edema.  DP +1 Neuro: Alert and oriented X 3. Psych:  Good affect, responds appropriately   EKG: Sinus bradycardia with Non-specific T-wave abnormalities in the lateral leads. New from last EKG in Nov of 2015. Rate of 56 bpm.   ASSESSMENT AND PLAN

## 2014-07-23 NOTE — Progress Notes (Deleted)
Name: Jerry Burns    DOB: 1933-09-20  Age: 80 y.o.  MR#: [id number]       PCP:  Sallee Lange, MD      Insurance: Payor: MEDICARE / Plan: MEDICARE PART A AND B / Product Type: *No Product type* /   CC:    Chief Complaint  Patient presents with  . Coronary Artery Disease  . Hypertension    VS Filed Vitals:   07/23/14 1357  BP: 152/74  Pulse: 57  Height: 6' (1.829 m)  Weight: 224 lb (101.606 kg)  SpO2: 96%    Weights Current Weight  07/23/14 224 lb (101.606 kg)  06/07/14 219 lb (99.338 kg)  05/07/14 218 lb 4 oz (98.998 kg)    Blood Pressure  BP Readings from Last 3 Encounters:  07/23/14 152/74  06/07/14 112/56  05/07/14 122/68     Admit date:  (Not on file) Last encounter with RMR:  Visit date not found   Allergy Latex  Current Outpatient Prescriptions  Medication Sig Dispense Refill  . amLODipine (NORVASC) 5 MG tablet TAKE 1 BY MOUTH DAILY 90 tablet 3  . aspirin 81 MG tablet Take 81 mg by mouth daily.    Marland Kitchen atorvastatin (LIPITOR) 40 MG tablet TAKE 1 BY MOUTH DAILY 90 tablet 0  . BAYER CONTOUR TEST test strip USE ONE STRIP TO TEST BLOOD SUGAR EVERY DAY AS DIRECTED 50 each 0  . carvedilol (COREG) 6.25 MG tablet TAKE (1) TABLET BY MOUTH TWICE DAILY. 60 tablet 3  . dutasteride (AVODART) 0.5 MG capsule Take 1 capsule (0.5 mg total) by mouth daily. 90 capsule 1  . glipiZIDE (GLUCOTROL) 5 MG tablet Take 0.5 tablets (2.5 mg total) by mouth daily before breakfast. 15 tablet 0  . isosorbide mononitrate (IMDUR) 30 MG 24 hr tablet Take 1 tablet (30 mg total) by mouth daily. 90 tablet 0  . LANTUS SOLOSTAR 100 UNIT/ML Solostar Pen INJECT 31 TO 34 UNITS TOTAL INTO THE SKIN AT BEDTIME 30 mL 0  . metFORMIN (GLUCOPHAGE) 1000 MG tablet Take 1 tablet (1,000 mg total) by mouth 2 (two) times daily with a meal. 180 tablet 3  . Multiple Vitamin (MULTIVITAMIN) tablet Take 1 tablet by mouth daily.    . quinapril (ACCUPRIL) 40 MG tablet TAKE 1 BY MOUTH AT BEDTIME 90 tablet 0  . RELION MINI  PEN NEEDLES 31G X 6 MM MISC USE AS DIRECTED 100 each 0  . sulfacetamide (BLEPH-10) 10 % ophthalmic solution INSTILL 4 DROPS INTO THE AFFECTED EYE(S) 4 TIMES DAILY FOR 5 DAYS. 15 mL 0  . tamsulosin (FLOMAX) 0.4 MG CAPS capsule TAKE 1 BY MOUTH DAILY 90 capsule 0   No current facility-administered medications for this visit.    Discontinued Meds:   There are no discontinued medications.  Patient Active Problem List   Diagnosis Date Noted  . Rotator cuff syndrome of right shoulder 12/16/2011  . Shoulder pain, right 12/16/2011  . HYPERKALEMIA 01/31/2009  . KNEE PAIN, LEFT 07/31/2008  . DIABETES MELLITUS, TYPE II, CONTROLLED, W/NEURO COMPS 01/31/2008  . DIABETIC FOOT ULCER, TOE 12/23/2007  . BENIGN PROSTATIC HYPERTROPHY 01/26/2007  . URINARY TRACT INFECTION 01/12/2007  . URINARY RETENTION 01/12/2007  . ERECTILE DYSFUNCTION 11/01/2006  . HYPERLIPIDEMIA 08/04/2006  . Essential hypertension 08/04/2006  . OSTEOARTHRITIS 08/04/2006    LABS    Component Value Date/Time   NA 140 04/23/2014 0704   NA 138 10/26/2013 0712   NA 141 12/21/2012 0720   K 4.9 04/23/2014 0704  K 5.0 10/26/2013 0712   K 4.8 12/21/2012 0720   CL 105 04/23/2014 0704   CL 103 10/26/2013 0712   CL 105 12/21/2012 0720   CO2 25 04/23/2014 0704   CO2 28 10/26/2013 0712   CO2 27 12/21/2012 0720   GLUCOSE 132* 04/23/2014 0704   GLUCOSE 89 10/26/2013 0712   GLUCOSE 144* 12/21/2012 0720   BUN 20 04/23/2014 0704   BUN 20 10/26/2013 0712   BUN 20 12/21/2012 0720   CREATININE 0.93 04/23/2014 0704   CREATININE 0.89 10/26/2013 0712   CREATININE 1.01 12/21/2012 0720   CREATININE 1.14 01/30/2009 2332   CREATININE 0.86 07/31/2008 2308   CREATININE 1.09 03/30/2008 1459   CALCIUM 9.3 04/23/2014 0704   CALCIUM 9.7 10/26/2013 0712   CALCIUM 9.5 12/21/2012 0720   GFRNONAA >60 03/30/2008 1459   GFRAA  03/30/2008 1459    >60        The eGFR has been calculated using the MDRD equation. This calculation has not  been validated in all clinical   CMP     Component Value Date/Time   NA 140 04/23/2014 0704   K 4.9 04/23/2014 0704   CL 105 04/23/2014 0704   CO2 25 04/23/2014 0704   GLUCOSE 132* 04/23/2014 0704   BUN 20 04/23/2014 0704   CREATININE 0.93 04/23/2014 0704   CREATININE 1.14 01/30/2009 2332   CALCIUM 9.3 04/23/2014 0704   PROT 6.4 04/23/2014 0704   ALBUMIN 4.0 04/23/2014 0704   AST 18 04/23/2014 0704   ALT 13 04/23/2014 0704   ALKPHOS 63 04/23/2014 0704   BILITOT 0.5 04/23/2014 0704   GFRNONAA >60 03/30/2008 1459   GFRAA  03/30/2008 1459    >60        The eGFR has been calculated using the MDRD equation. This calculation has not been validated in all clinical       Component Value Date/Time   WBC 5.9 01/30/2009 2332   WBC 5.7 07/31/2008 2308   WBC 5.2 01/31/2008 2212   HGB 13.0 01/30/2009 2332   HGB 12.5* 07/31/2008 2308   HGB 11.6* 03/30/2008 1459   HCT 37.7* 01/30/2009 2332   HCT 38.5* 07/31/2008 2308   HCT 33.9* 03/30/2008 1459   MCV 88.5 01/30/2009 2332   MCV 89.5 07/31/2008 2308   MCV 89.5 01/31/2008 2212    Lipid Panel     Component Value Date/Time   CHOL 136 04/23/2014 0704   TRIG 86 04/23/2014 0704   HDL 49 04/23/2014 0704   CHOLHDL 2.8 04/23/2014 0704   VLDL 17 04/23/2014 0704   LDLCALC 70 04/23/2014 0704    ABG No results found for: PHART, PCO2ART, PO2ART, HCO3, TCO2, ACIDBASEDEF, O2SAT   No results found for: TSH BNP (last 3 results) No results for input(s): PROBNP in the last 8760 hours. Cardiac Panel (last 3 results) No results for input(s): CKTOTAL, CKMB, TROPONINI, RELINDX in the last 72 hours.  Iron/TIBC/Ferritin/ %Sat No results found for: IRON, TIBC, FERRITIN, IRONPCTSAT   EKG Orders placed or performed in visit on 07/23/14  . EKG 12-Lead     Prior Assessment and Plan Problem List as of 07/23/2014    DIABETES MELLITUS, TYPE II, CONTROLLED, W/NEURO COMPS   DIABETIC FOOT ULCER, TOE   HYPERLIPIDEMIA   HYPERKALEMIA   ERECTILE  DYSFUNCTION   Essential hypertension   URINARY TRACT INFECTION   BENIGN PROSTATIC HYPERTROPHY   OSTEOARTHRITIS   KNEE PAIN, LEFT   URINARY RETENTION   Rotator cuff syndrome of  right shoulder   Shoulder pain, right       Imaging: No results found.

## 2014-07-31 DIAGNOSIS — E785 Hyperlipidemia, unspecified: Secondary | ICD-10-CM | POA: Diagnosis not present

## 2014-07-31 DIAGNOSIS — E119 Type 2 diabetes mellitus without complications: Secondary | ICD-10-CM | POA: Diagnosis not present

## 2014-08-01 LAB — HEMOGLOBIN A1C
HEMOGLOBIN A1C: 7.6 % — AB (ref <5.7)
MEAN PLASMA GLUCOSE: 171 mg/dL — AB (ref <117)

## 2014-08-01 LAB — LIPID PANEL
Cholesterol: 155 mg/dL (ref 0–200)
HDL: 53 mg/dL (ref >39)
LDL CALC: 83 mg/dL (ref 0–99)
TRIGLYCERIDES: 95 mg/dL (ref <150)
Total CHOL/HDL Ratio: 2.9 Ratio
VLDL: 19 mg/dL (ref 0–40)

## 2014-08-02 ENCOUNTER — Encounter (HOSPITAL_COMMUNITY)
Admission: RE | Admit: 2014-08-02 | Discharge: 2014-08-02 | Disposition: A | Discharge status: Home / Self Care | Payer: Medicare Other | Source: Ambulatory Visit | Attending: Adult Health | Admitting: Adult Health

## 2014-08-02 ENCOUNTER — Encounter (HOSPITAL_COMMUNITY): Payer: Self-pay

## 2014-08-02 ENCOUNTER — Ambulatory Visit (HOSPITAL_COMMUNITY)
Admission: RE | Admit: 2014-08-02 | Discharge: 2014-08-02 | Disposition: A | Discharge status: Home / Self Care | Payer: Medicare Other | Source: Ambulatory Visit | Attending: Adult Health | Admitting: Adult Health

## 2014-08-02 DIAGNOSIS — R079 Chest pain, unspecified: Secondary | ICD-10-CM

## 2014-08-02 DIAGNOSIS — E785 Hyperlipidemia, unspecified: Secondary | ICD-10-CM | POA: Diagnosis not present

## 2014-08-02 DIAGNOSIS — I2510 Atherosclerotic heart disease of native coronary artery without angina pectoris: Secondary | ICD-10-CM | POA: Insufficient documentation

## 2014-08-02 DIAGNOSIS — I10 Essential (primary) hypertension: Secondary | ICD-10-CM | POA: Insufficient documentation

## 2014-08-02 DIAGNOSIS — E119 Type 2 diabetes mellitus without complications: Secondary | ICD-10-CM | POA: Insufficient documentation

## 2014-08-02 DIAGNOSIS — Z8673 Personal history of transient ischemic attack (TIA), and cerebral infarction without residual deficits: Secondary | ICD-10-CM | POA: Insufficient documentation

## 2014-08-02 DIAGNOSIS — I369 Nonrheumatic tricuspid valve disorder, unspecified: Secondary | ICD-10-CM

## 2014-08-02 DIAGNOSIS — I517 Cardiomegaly: Secondary | ICD-10-CM | POA: Diagnosis not present

## 2014-08-02 DIAGNOSIS — R5383 Other fatigue: Secondary | ICD-10-CM | POA: Insufficient documentation

## 2014-08-02 HISTORY — DX: Essential (primary) hypertension: I10

## 2014-08-02 MED ORDER — SODIUM CHLORIDE 0.9 % IJ SOLN
INTRAMUSCULAR | Status: AC
  Administered 2014-08-02: 10 mL via INTRAVENOUS
  Filled 2014-08-02: qty 3

## 2014-08-02 MED ORDER — SODIUM CHLORIDE 0.9 % IJ SOLN
10.0000 mL | INTRAMUSCULAR | Status: DC | PRN
  Administered 2014-08-02: 10 mL via INTRAVENOUS
  Filled 2014-08-02: qty 10

## 2014-08-02 MED ORDER — REGADENOSON 0.4 MG/5ML IV SOLN
INTRAVENOUS | Status: AC
  Filled 2014-08-02: qty 5

## 2014-08-02 MED ORDER — TECHNETIUM TC 99M SESTAMIBI - CARDIOLITE
10.0000 | Freq: Once | INTRAVENOUS | Status: AC | PRN
  Administered 2014-08-02: 10:00:00 10 via INTRAVENOUS

## 2014-08-02 MED ORDER — TECHNETIUM TC 99M SESTAMIBI GENERIC - CARDIOLITE
30.0000 | Freq: Once | INTRAVENOUS | Status: AC | PRN
  Administered 2014-08-02: 30 via INTRAVENOUS

## 2014-08-02 NOTE — Progress Notes (Signed)
Stress Lab Nurses Notes - Jerry Burns  Jerry Burns 08/02/2014 Reason for doing test: CAD and Chest Pain Type of test: Stress Cardiolite Nurse performing test: Gerrit Halls, RN Nuclear Medicine Tech: Melburn Hake Echo Tech: Not Applicable MD performing test: Branch/K.Purcell Nails NP Family MD: Dr. Sallee Lange Test explained and consent signed: Yes.   IV started: Saline lock flushed, No redness or edema and Saline lock started in radiology Symptoms: Fatigue Treatment/Intervention: None Reason test stopped: fatigue After recovery IV was: Discontinued via X-ray tech and No redness or edema Patient to return to Nuc. Med at :12:45 Patient discharged: Home Patient's Condition upon discharge was: stable Comments: During test peak BP 195/66 & HR 123 .  Recovery BP 153/89 & HR 64.  Symptoms resolved in recovery. Jerry Burns T

## 2014-08-02 NOTE — Progress Notes (Signed)
  Echocardiogram 2D Echocardiogram has been performed.  [place], [place] 08/02/2014, 9:29 AM

## 2014-08-08 ENCOUNTER — Encounter: Payer: Self-pay | Admitting: Family Medicine

## 2014-08-08 ENCOUNTER — Ambulatory Visit (INDEPENDENT_AMBULATORY_CARE_PROVIDER_SITE_OTHER): Payer: Medicare Other | Admitting: Family Medicine

## 2014-08-08 VITALS — BP 128/78 | Ht 72.0 in | Wt 221.0 lb

## 2014-08-08 DIAGNOSIS — E119 Type 2 diabetes mellitus without complications: Secondary | ICD-10-CM | POA: Diagnosis not present

## 2014-08-08 DIAGNOSIS — E1122 Type 2 diabetes mellitus with diabetic chronic kidney disease: Secondary | ICD-10-CM | POA: Insufficient documentation

## 2014-08-08 DIAGNOSIS — N184 Chronic kidney disease, stage 4 (severe): Secondary | ICD-10-CM | POA: Insufficient documentation

## 2014-08-08 DIAGNOSIS — E785 Hyperlipidemia, unspecified: Secondary | ICD-10-CM | POA: Diagnosis not present

## 2014-08-08 DIAGNOSIS — I10 Essential (primary) hypertension: Secondary | ICD-10-CM

## 2014-08-08 DIAGNOSIS — E1140 Type 2 diabetes mellitus with diabetic neuropathy, unspecified: Secondary | ICD-10-CM | POA: Insufficient documentation

## 2014-08-08 DIAGNOSIS — Z794 Long term (current) use of insulin: Secondary | ICD-10-CM | POA: Insufficient documentation

## 2014-08-08 MED ORDER — GLIPIZIDE 5 MG PO TABS: 2.5000 mg | ORAL_TABLET | Freq: Two times a day (BID) | ORAL | Status: DC

## 2014-08-08 NOTE — Progress Notes (Addendum)
   Subjective:    Patient ID: Jerry Burns, male    DOB: 04-22-1934, 81 y.o.   MRN: [id number]  Diabetes He presents for his follow-up diabetic visit. He has type 2 diabetes mellitus. There are no hypoglycemic associated symptoms. There are no diabetic associated symptoms. Symptoms are stable. Current diabetic treatment includes insulin injections and oral agent (dual therapy). He participates in exercise daily. His highest blood glucose is >200 mg/dl. His overall blood glucose range is 180-200 mg/dl. He does not see a podiatrist.Eye exam is current.   Seeing a cardiologist tomorrow.  When he eats oranges, his blood sugar goes up. Patient states he eats too many fruits. He does try to limit the fats in his diet. He does do some physical activity walking He denies any low sugar spells He relates compliance with his cardiac medicine cholesterol medicine and diabetes medicine. His glucose readings were reviewed today.    Review of Systems    he denies excessive thirst or urination he denies rectal bleeding denies hematuria denies vomiting diarrhea. Denies chest tightness pressure pain Objective:   Physical Exam Neck no masses lungs are clear no crackles heart is regular pulse normal blood pressure is good diabetic foot exam completed abdomen is soft       Assessment & Plan:  #1 diabetes subpar control he's got a cut back on the fruits that causes spikes and sugars #2 we are going to increase glipizide half tablet twice a day #3 cholesterol profile blood pressure overall looks good #4 patient does not need diabetic shoes #5 patient is due for colonoscopy we will help set him up  Patient to follow-up 3 months lab work A1c at that time  This patient will be due colonoscopy in April. We will send notification to his gastroenterologist Dr.Rourke  This patient's diabetes shows worsening diabetic control. The patient was encouraged to check his sugars on a regular basis in the morning  they should be less than 1:30. Pre-meal they should be less than 150. Prebedtime less than 160. He has fluctuating sugars in a controlled diabetes. He should be testing himself at least twice a day maximum 3 times per day.  The patient did bring in his readings which clearly show that he is testing twice per day.

## 2014-08-09 ENCOUNTER — Encounter: Payer: Self-pay | Admitting: *Deleted

## 2014-08-09 ENCOUNTER — Other Ambulatory Visit: Payer: Self-pay

## 2014-08-09 ENCOUNTER — Ambulatory Visit (INDEPENDENT_AMBULATORY_CARE_PROVIDER_SITE_OTHER): Payer: Medicare Other | Admitting: Adult Health

## 2014-08-09 ENCOUNTER — Ambulatory Visit (HOSPITAL_COMMUNITY)
Admission: RE | Admit: 2014-08-09 | Discharge: 2014-08-09 | Disposition: A | Discharge status: Home / Self Care | Payer: Medicare Other | Source: Ambulatory Visit | Attending: Adult Health | Admitting: Adult Health

## 2014-08-09 ENCOUNTER — Other Ambulatory Visit: Payer: Self-pay | Admitting: Adult Health

## 2014-08-09 ENCOUNTER — Encounter: Payer: Self-pay | Admitting: Adult Health

## 2014-08-09 VITALS — BP 130/70 | HR 63 | Ht 72.0 in | Wt 222.0 lb

## 2014-08-09 DIAGNOSIS — E119 Type 2 diabetes mellitus without complications: Secondary | ICD-10-CM | POA: Diagnosis not present

## 2014-08-09 DIAGNOSIS — R079 Chest pain, unspecified: Secondary | ICD-10-CM

## 2014-08-09 DIAGNOSIS — J449 Chronic obstructive pulmonary disease, unspecified: Secondary | ICD-10-CM | POA: Diagnosis not present

## 2014-08-09 DIAGNOSIS — I10 Essential (primary) hypertension: Secondary | ICD-10-CM | POA: Diagnosis not present

## 2014-08-09 DIAGNOSIS — I25709 Atherosclerosis of coronary artery bypass graft(s), unspecified, with unspecified angina pectoris: Secondary | ICD-10-CM

## 2014-08-09 DIAGNOSIS — Z87891 Personal history of nicotine dependence: Secondary | ICD-10-CM | POA: Insufficient documentation

## 2014-08-09 DIAGNOSIS — Z01818 Encounter for other preprocedural examination: Secondary | ICD-10-CM | POA: Diagnosis not present

## 2014-08-09 NOTE — Progress Notes (Signed)
HPI: Mr. Nessmith is a 81 year old patient of Dr. Bronson Ing, that we follow for ongoing assessment and management of CAD, distal small vessel disease, hypertension, with known history of diabetes.  The patient was last seen in the office in December 2015 after complaints of chest discomfort, and dyspnea walking approximately 100 feet.  He was planned for a stress Myoview and echocardiogram.  He had mildly elevated blood pressure on last office visit, but this was not treated and was anxious about his symptoms.   Today feeling about the same.  Has limited his exercise.      Nuclear medicine stress test was completed on 08/02/2014 , which revealed moderate inferior infarct, with mild peri- infarct ischemia ,moderate apical infarct, with moderate peri-infarct  ischemia.  High-risk stress test findings, left ventricular ejection fraction 52%.echocardiogram completed, the same day demonstrated LVEF of 60-65%, wall motion was normal, grade 2 diastolic dysfunction.  Aortic valve mildly calcified trileaflet mildly thickened, flutter valve, mildly calcified annulus, left atrium.  The atrium was severely dilated.  Right atrium was moderately dilated.  Atrial septum reveal increased thickness of the septum, consistent with lipomatous hypertrophy.  PA pressure 32 mm mercury.  .  IVC was dilated with normal respiratory variation with an estimated RA pressure of 8 mmHg   He has not had to take a nitroglycerin, sublingual.  He has been medically compliant.  Allergies  Allergen Reactions  . Latex Other (See Comments)    bandaids cause blisters after 24 hours     Current Outpatient Prescriptions  Medication Sig Dispense Refill  . amLODipine (NORVASC) 5 MG tablet TAKE 1 BY MOUTH DAILY 90 tablet 3  . aspirin 81 MG tablet Take 81 mg by mouth daily.    Marland Kitchen atorvastatin (LIPITOR) 40 MG tablet TAKE 1 BY MOUTH DAILY 90 tablet 0  . BAYER CONTOUR TEST test strip USE ONE STRIP TO TEST BLOOD SUGAR EVERY DAY AS DIRECTED 50  each 0  . carvedilol (COREG) 6.25 MG tablet TAKE (1) TABLET BY MOUTH TWICE DAILY. 60 tablet 3  . dutasteride (AVODART) 0.5 MG capsule Take 1 capsule (0.5 mg total) by mouth daily. 90 capsule 1  . glipiZIDE (GLUCOTROL) 5 MG tablet Take 0.5 tablets (2.5 mg total) by mouth 2 (two) times daily before a meal. 30 tablet 2  . isosorbide mononitrate (IMDUR) 30 MG 24 hr tablet Take 1 tablet (30 mg total) by mouth daily. 90 tablet 0  . LANTUS SOLOSTAR 100 UNIT/ML Solostar Pen INJECT 31 TO 34 UNITS TOTAL INTO THE SKIN AT BEDTIME 30 mL 0  . metFORMIN (GLUCOPHAGE) 1000 MG tablet Take 1 tablet (1,000 mg total) by mouth 2 (two) times daily with a meal. 180 tablet 3  . Multiple Vitamin (MULTIVITAMIN) tablet Take 1 tablet by mouth daily.    . nitroGLYCERIN (NITROSTAT) 0.4 MG SL tablet Place 1 tablet (0.4 mg total) under the tongue every 5 (five) minutes as needed for chest pain. 90 tablet 3  . quinapril (ACCUPRIL) 40 MG tablet TAKE 1 BY MOUTH AT BEDTIME 90 tablet 0  . RELION MINI PEN NEEDLES 31G X 6 MM MISC USE AS DIRECTED 100 each 0  . sulfacetamide (BLEPH-10) 10 % ophthalmic solution INSTILL 4 DROPS INTO THE AFFECTED EYE(S) 4 TIMES DAILY FOR 5 DAYS. 15 mL 0  . tamsulosin (FLOMAX) 0.4 MG CAPS capsule TAKE 1 BY MOUTH DAILY 90 capsule 0   No current facility-administered medications for this visit.    Past Medical History  Diagnosis Date  .  Diabetes mellitus   . Hyperlipidemia   . Stroke   . Arthritis     Bilateral knee  . Hypertension     Past Surgical History  Procedure Laterality Date  . Tonsillectomy    . Appendectomy    . Shoulder surgery    . Colonoscopy      ROS: Complete review of systems performed and found to be negative unless outlined above  PHYSICAL EXAM BP 130/70 mmHg  Pulse 63  Ht 6' (1.829 m)  Wt 222 lb (100.699 kg)  BMI 30.10 kg/m2  SpO2 95% General: Well developed, well nourished, in no acute distress Head: Eyes PERRLA, No xanthomas.   Normal cephalic and  atramatic  Lungs: Clear bilaterally to auscultation and percussion. Heart: HRRR S1 S2, without MRG.  Pulses are 2+ & equal.            No carotid bruit. No JVD.  No abdominal bruits. No femoral bruits. Abdomen: Bowel sounds are positive, abdomen soft and non-tender without masses or                  Hernia's noted. Msk:  Back normal, normal gait. Normal strength and tone for age. Extremities: No clubbing, cyanosis or edema.  DP +1 Neuro: Alert and oriented X 3. Psych:  Good affect, responds appropriately   ASSESSMENT AND PLAN

## 2014-08-09 NOTE — Progress Notes (Deleted)
Name: Jerry Burns    DOB: 08/19/1933  Age: 80 y.o.  MR#: [id number]       PCP:  LUKING,SCOTT, MD      Insurance: Payor: MEDICARE / Plan: MEDICARE PART A AND B / Product Type: *No Product type* /   CC:    Chief Complaint  Patient presents with  . Coronary Artery Disease  . Hypertension    VS Filed Vitals:   08/09/14 1341  BP: 130/70  Pulse: 63  Height: 6' (1.829 m)  Weight: 222 lb (100.699 kg)  SpO2: 95%    Weights Current Weight  08/09/14 222 lb (100.699 kg)  08/08/14 221 lb (100.245 kg)  07/23/14 224 lb (101.606 kg)    Blood Pressure  BP Readings from Last 3 Encounters:  08/09/14 130/70  08/08/14 128/78  07/23/14 152/74     Admit date:  (Not on file) Last encounter with RMR:  07/23/2014   Allergy Latex  Current Outpatient Prescriptions  Medication Sig Dispense Refill  . amLODipine (NORVASC) 5 MG tablet TAKE 1 BY MOUTH DAILY 90 tablet 3  . aspirin 81 MG tablet Take 81 mg by mouth daily.    . atorvastatin (LIPITOR) 40 MG tablet TAKE 1 BY MOUTH DAILY 90 tablet 0  . BAYER CONTOUR TEST test strip USE ONE STRIP TO TEST BLOOD SUGAR EVERY DAY AS DIRECTED 50 each 0  . carvedilol (COREG) 6.25 MG tablet TAKE (1) TABLET BY MOUTH TWICE DAILY. 60 tablet 3  . dutasteride (AVODART) 0.5 MG capsule Take 1 capsule (0.5 mg total) by mouth daily. 90 capsule 1  . glipiZIDE (GLUCOTROL) 5 MG tablet Take 0.5 tablets (2.5 mg total) by mouth 2 (two) times daily before a meal. 30 tablet 2  . isosorbide mononitrate (IMDUR) 30 MG 24 hr tablet Take 1 tablet (30 mg total) by mouth daily. 90 tablet 0  . LANTUS SOLOSTAR 100 UNIT/ML Solostar Pen INJECT 31 TO 34 UNITS TOTAL INTO THE SKIN AT BEDTIME 30 mL 0  . metFORMIN (GLUCOPHAGE) 1000 MG tablet Take 1 tablet (1,000 mg total) by mouth 2 (two) times daily with a meal. 180 tablet 3  . Multiple Vitamin (MULTIVITAMIN) tablet Take 1 tablet by mouth daily.    . nitroGLYCERIN (NITROSTAT) 0.4 MG SL tablet Place 1 tablet (0.4 mg total) under the tongue  every 5 (five) minutes as needed for chest pain. 90 tablet 3  . quinapril (ACCUPRIL) 40 MG tablet TAKE 1 BY MOUTH AT BEDTIME 90 tablet 0  . RELION MINI PEN NEEDLES 31G X 6 MM MISC USE AS DIRECTED 100 each 0  . sulfacetamide (BLEPH-10) 10 % ophthalmic solution INSTILL 4 DROPS INTO THE AFFECTED EYE(S) 4 TIMES DAILY FOR 5 DAYS. 15 mL 0  . tamsulosin (FLOMAX) 0.4 MG CAPS capsule TAKE 1 BY MOUTH DAILY 90 capsule 0   No current facility-administered medications for this visit.    Discontinued Meds:   There are no discontinued medications.  Patient Active Problem List   Diagnosis Date Noted  . Diabetes type 2, controlled 08/08/2014  . Chest pain on exertion 07/23/2014  . Rotator cuff syndrome of right shoulder 12/16/2011  . Shoulder pain, right 12/16/2011  . HYPERKALEMIA 01/31/2009  . KNEE PAIN, LEFT 07/31/2008  . DIABETIC FOOT ULCER, TOE 12/23/2007  . BENIGN PROSTATIC HYPERTROPHY 01/26/2007  . URINARY TRACT INFECTION 01/12/2007  . URINARY RETENTION 01/12/2007  . ERECTILE DYSFUNCTION 11/01/2006  . Hyperlipidemia 08/04/2006  . Essential hypertension 08/04/2006  . OSTEOARTHRITIS 08/04/2006    LABS      Component Value Date/Time   NA 140 04/23/2014 0704   NA 138 10/26/2013 0712   NA 141 12/21/2012 0720   K 4.9 04/23/2014 0704   K 5.0 10/26/2013 0712   K 4.8 12/21/2012 0720   CL 105 04/23/2014 0704   CL 103 10/26/2013 0712   CL 105 12/21/2012 0720   CO2 25 04/23/2014 0704   CO2 28 10/26/2013 0712   CO2 27 12/21/2012 0720   GLUCOSE 132* 04/23/2014 0704   GLUCOSE 89 10/26/2013 0712   GLUCOSE 144* 12/21/2012 0720   BUN 20 04/23/2014 0704   BUN 20 10/26/2013 0712   BUN 20 12/21/2012 0720   CREATININE 0.93 04/23/2014 0704   CREATININE 0.89 10/26/2013 0712   CREATININE 1.01 12/21/2012 0720   CREATININE 1.14 01/30/2009 2332   CREATININE 0.86 07/31/2008 2308   CREATININE 1.09 03/30/2008 1459   CALCIUM 9.3 04/23/2014 0704   CALCIUM 9.7 10/26/2013 0712   CALCIUM 9.5 12/21/2012  0720   GFRNONAA >60 03/30/2008 1459   GFRAA  03/30/2008 1459    >60        The eGFR has been calculated using the MDRD equation. This calculation has not been validated in all clinical   CMP     Component Value Date/Time   NA 140 04/23/2014 0704   K 4.9 04/23/2014 0704   CL 105 04/23/2014 0704   CO2 25 04/23/2014 0704   GLUCOSE 132* 04/23/2014 0704   BUN 20 04/23/2014 0704   CREATININE 0.93 04/23/2014 0704   CREATININE 1.14 01/30/2009 2332   CALCIUM 9.3 04/23/2014 0704   PROT 6.4 04/23/2014 0704   ALBUMIN 4.0 04/23/2014 0704   AST 18 04/23/2014 0704   ALT 13 04/23/2014 0704   ALKPHOS 63 04/23/2014 0704   BILITOT 0.5 04/23/2014 0704   GFRNONAA >60 03/30/2008 1459   GFRAA  03/30/2008 1459    >60        The eGFR has been calculated using the MDRD equation. This calculation has not been validated in all clinical       Component Value Date/Time   WBC 5.9 01/30/2009 2332   WBC 5.7 07/31/2008 2308   WBC 5.2 01/31/2008 2212   HGB 13.0 01/30/2009 2332   HGB 12.5* 07/31/2008 2308   HGB 11.6* 03/30/2008 1459   HCT 37.7* 01/30/2009 2332   HCT 38.5* 07/31/2008 2308   HCT 33.9* 03/30/2008 1459   MCV 88.5 01/30/2009 2332   MCV 89.5 07/31/2008 2308   MCV 89.5 01/31/2008 2212    Lipid Panel     Component Value Date/Time   CHOL 155 07/31/2014 0703   TRIG 95 07/31/2014 0703   HDL 53 07/31/2014 0703   CHOLHDL 2.9 07/31/2014 0703   VLDL 19 07/31/2014 0703   LDLCALC 83 07/31/2014 0703    ABG No results found for: PHART, PCO2ART, PO2ART, HCO3, TCO2, ACIDBASEDEF, O2SAT   No results found for: TSH BNP (last 3 results) No results for input(s): PROBNP in the last 8760 hours. Cardiac Panel (last 3 results) No results for input(s): CKTOTAL, CKMB, TROPONINI, RELINDX in the last 72 hours.  Iron/TIBC/Ferritin/ %Sat No results found for: IRON, TIBC, FERRITIN, IRONPCTSAT   EKG Orders placed or performed in visit on 07/23/14  . EKG 12-Lead     Prior Assessment and  Plan Problem List as of 08/09/2014      Cardiovascular and Mediastinum   Essential hypertension   Last Assessment & Plan 07/23/2014 Office Visit Written 07/23/2014  2:48 PM by Kathryn M   Lawrence, NP    Slightly elevated today. This is his first visit with me and he is concerned about his new symptoms. Will monitor this on next visit without change in medications at this time.         Endocrine   DIABETIC FOOT ULCER, TOE   Diabetes type 2, controlled     Musculoskeletal and Integument   OSTEOARTHRITIS   Rotator cuff syndrome of right shoulder     Genitourinary   URINARY TRACT INFECTION   BENIGN PROSTATIC HYPERTROPHY   URINARY RETENTION     Other   Hyperlipidemia   HYPERKALEMIA   ERECTILE DYSFUNCTION   KNEE PAIN, LEFT   Shoulder pain, right   Chest pain on exertion   Last Assessment & Plan 07/23/2014 Office Visit Written 07/23/2014  2:47 PM by Kathryn M Lawrence, NP    He has symptoms worrisome for typical angina. EKG does show some EKG changes in the lateral leads, some flattening V3-V6. I will schedule him for NM stress test and echocardiogram. He will continue Imdur 30 mg daily. I will refill his NTG sublingual. Will risk factors of hypertension, diabetes, I am concerned about progression of small vessel CAD. He will come to ER if chest pain occurs at rest or with minimal exertion. He is to be sitting down when he takes NTG SL. He will follow up with me or Dr. Koneswaran post stress test and echo.,          Imaging: Nm Myocar Multi W/spect W/wall Motion / Ef  08/02/2014   CLINICAL DATA:  80-year-old male with a known history of coronary artery disease referred for chest pain.  EXAM: MYOCARDIAL IMAGING WITH SPECT (REST AND EXERCISE)  GATED LEFT VENTRICULAR WALL MOTION STUDY  LEFT VENTRICULAR EJECTION FRACTION  TECHNIQUE: Standard myocardial SPECT imaging was performed after resting intravenous injection of 10 mCi Tc-99m sestamibi. Subsequently, exercise tolerance test was  performed by the patient under the supervision of the Cardiology staff. At peak-stress, 30 mCi Tc-99m sestamibi was injected intravenously and standard myocardial SPECT imaging was performed. Quantitative gated imaging was also performed to evaluate left ventricular wall motion, and estimate left ventricular ejection fraction.  COMPARISON:  None.  FINDINGS: Exercise stress  Baseline EKG showed a sinus rhythm with left axis deviation and probable left anterior fascicular block. The patient was exercised according to the Bruce protocol for 6 min and 31 seconds achieving a work level of 7.00 Mets. The resting heart rate 55 beats per min rose to a maximal rate of 123 beats per min, representing 86% of the maximal age predicted heart rate. The resting blood pressure of 142/65 increased 211/109 with exercise. The test was stopped due to fatigue, the patient did not experience any chest pain. Stress EKG interpretation is affected by heavy artifact. There were no specific ischemic changes and no significant arrhythmias.  Perfusion: There is a moderate-sized mild intensity inferior wall defect with mild peri-infarct ischemia. There is a moderate-sized moderate intensity apical defect with moderate peri-infarct ischemia.  Wall Motion: There is mild apical hypokinesis. There is no left ventricular enlargement.  Left Ventricular Ejection Fraction: 52 %  End diastolic volume 135 ml  End systolic volume 65 ml  IMPRESSION: 1. Moderate inferior infarct with mild peri-infarct ischemia. Moderate apical infarct with moderate peri-infarct ischemia.  2. Normal left ventricular wall motion.  3. Left ventricular ejection fraction 52%  4. High-risk stress test findings*.  *2012 Appropriate Use Criteria for Coronary Revascularization Focused Update: J Am Coll   Cardiol. 2536;64(4):034-742. http://content.airportbarriers.com.aspx?articleid=[email or web address]   Electronically Signed   By: Carlyle Dolly   On: 08/02/2014 15:30

## 2014-08-09 NOTE — Assessment & Plan Note (Signed)
Currently well-controlled.  Is medically compliant.  No changes in medication regimen.  Await cardiac catheterization results for any adjustments, which may be necessary.

## 2014-08-09 NOTE — Assessment & Plan Note (Signed)
As stated, he had a high-risk abnormal nuclear medicine stress test read by Dr. Harl Bowie on 1/ 7/ 2016. As a result of these findings.  I discussed with the patient and Dr. Domenic Polite, who is in agreement, but the patient will need to have a cardiac catheterization and definitive evaluation of coronary anatomy.  I discussed the risks and benefits, possibility of intervention if necessary.  He verbalizes understanding and is willing to proceed.  All questions and concerns were answered.  His plan to have a cardiac catheterization on Monday, January 18th, 2016, with Dr. Martinique.  He is provided precardiac catheterization instructions, and all questions have been answered.

## 2014-08-09 NOTE — Patient Instructions (Addendum)
  Your physician has requested that you have a cardiac catheterization. Cardiac catheterization is used to diagnose and/or treat various heart conditions. Doctors may recommend this procedure for a number of different reasons. The most common reason is to evaluate chest pain. Chest pain can be a symptom of coronary artery disease (CAD), and cardiac catheterization can show whether plaque is narrowing or blocking your heart's arteries. This procedure is also used to evaluate the valves, as well as measure the blood flow and oxygen levels in different parts of your heart. For further information please visit HugeFiesta.tn. Please follow instruction sheet, as given.  Your physician recommends that you continue on your current medications as directed. Please refer to the Current Medication list given to you today.  Thank you for choosing [facility]!

## 2014-08-13 ENCOUNTER — Telehealth: Payer: Self-pay | Admitting: *Deleted

## 2014-08-13 ENCOUNTER — Telehealth: Payer: Self-pay

## 2014-08-13 ENCOUNTER — Ambulatory Visit (HOSPITAL_COMMUNITY)
Admission: RE | Admit: 2014-08-13 | Discharge: 2014-08-13 | Disposition: A | Discharge status: Home / Self Care | Payer: Medicare Other | Source: Ambulatory Visit | Attending: Cardiology | Admitting: Cardiology

## 2014-08-13 ENCOUNTER — Encounter (HOSPITAL_COMMUNITY)
Admission: RE | Disposition: A | Discharge status: Home / Self Care | Payer: Self-pay | Source: Ambulatory Visit | Attending: Cardiology

## 2014-08-13 DIAGNOSIS — R9439 Abnormal result of other cardiovascular function study: Secondary | ICD-10-CM | POA: Diagnosis not present

## 2014-08-13 DIAGNOSIS — Z5329 Procedure and treatment not carried out because of patient's decision for other reasons: Secondary | ICD-10-CM | POA: Insufficient documentation

## 2014-08-13 DIAGNOSIS — I25709 Atherosclerosis of coronary artery bypass graft(s), unspecified, with unspecified angina pectoris: Secondary | ICD-10-CM | POA: Diagnosis not present

## 2014-08-13 LAB — BASIC METABOLIC PANEL
Anion gap: 5 (ref 5–15)
BUN: 27 mg/dL — ABNORMAL HIGH (ref 6–23)
CHLORIDE: 105 meq/L (ref 96–112)
CO2: 27 mmol/L (ref 19–32)
Calcium: 9.2 mg/dL (ref 8.4–10.5)
Creatinine, Ser: 1.01 mg/dL (ref 0.50–1.35)
GFR calc Af Amer: 80 mL/min — ABNORMAL LOW (ref >90)
GFR, EST NON AFRICAN AMERICAN: 69 mL/min — AB (ref >90)
Glucose, Bld: 193 mg/dL — ABNORMAL HIGH (ref 70–99)
POTASSIUM: 4.5 mmol/L (ref 3.5–5.1)
Sodium: 137 mmol/L (ref 135–145)

## 2014-08-13 LAB — GLUCOSE, CAPILLARY: GLUCOSE-CAPILLARY: 173 mg/dL — AB (ref 70–99)

## 2014-08-13 LAB — CBC
HCT: 33 % — ABNORMAL LOW (ref 39.0–52.0)
Hemoglobin: 11.1 g/dL — ABNORMAL LOW (ref 13.0–17.0)
MCH: 29.5 pg (ref 26.0–34.0)
MCHC: 33.6 g/dL (ref 30.0–36.0)
MCV: 87.8 fL (ref 78.0–100.0)
PLATELETS: 177 10*3/uL (ref 150–400)
RBC: 3.76 MIL/uL — ABNORMAL LOW (ref 4.22–5.81)
RDW: 13.1 % (ref 11.5–15.5)
WBC: 5.5 10*3/uL (ref 4.0–10.5)

## 2014-08-13 LAB — PROTIME-INR
INR: 1.16 (ref 0.00–1.49)
Prothrombin Time: 15 seconds (ref 11.6–15.2)

## 2014-08-13 SURGERY — LEFT HEART CATHETERIZATION WITH CORONARY ANGIOGRAM
Anesthesia: LOCAL

## 2014-08-13 MED ORDER — SODIUM CHLORIDE 0.9 % IJ SOLN: 3.0000 mL | Freq: Two times a day (BID) | INTRAMUSCULAR | Status: DC

## 2014-08-13 MED ORDER — DIAZEPAM 5 MG PO TABS
5.0000 mg | ORAL_TABLET | Freq: Once | ORAL | Status: AC
  Administered 2014-08-13: 5 mg via ORAL

## 2014-08-13 MED ORDER — SODIUM CHLORIDE 0.9 % IV SOLN: 250.0000 mL | INTRAVENOUS | Status: DC | PRN

## 2014-08-13 MED ORDER — SODIUM CHLORIDE 0.9 % IJ SOLN: 3.0000 mL | INTRAMUSCULAR | Status: DC | PRN

## 2014-08-13 MED ORDER — ASPIRIN 81 MG PO CHEW: 81.0000 mg | CHEWABLE_TABLET | ORAL | Status: DC

## 2014-08-13 MED ORDER — DIAZEPAM 5 MG PO TABS
ORAL_TABLET | ORAL | Status: AC
  Filled 2014-08-13: qty 1

## 2014-08-13 MED ORDER — DIAZEPAM 5 MG PO TABS: 5.0000 mg | ORAL_TABLET | Freq: Once | ORAL | Status: DC

## 2014-08-13 MED ORDER — SODIUM CHLORIDE 0.9 % IV SOLN
INTRAVENOUS | Status: DC
  Administered 2014-08-13: 08:00:00 via INTRAVENOUS

## 2014-08-13 NOTE — Telephone Encounter (Signed)
I spoke with patient and was able to encourage him to reschedule his heart cath for this Thursday 1/21,arrive 5:30 am at [facility] for 7:30 am case with Dr.Harding.Pt still has all instructions from today's cath which patient had cancelled.K.lawrence NP has written patient rx for Valium 5 mg po to take before arrival to hospital    Will forward to [place]
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
## 2014-08-13 NOTE — Progress Notes (Addendum)
Pt. Unwilling to wait any longer for procedure, wants IVs out and to go home; offered 2nd Valium 5mg , pt declined "just want to go home"; Dr. Martinique aware; pt instructed to call office to set up another appointment. Pt. Also refused to be transported out in wheelchair( had 5mg . Valium earlier)

## 2014-08-13 NOTE — Telephone Encounter (Signed)
-----   Message from Lendon Colonel, NP sent at 08/13/2014 11:20 AM EST ----- We should call him and try to reschedule ----- Message -----    From: Peter M Martinique, MD    Sent: 08/13/2014  10:24 AM      To: Herminio Commons, MD, #  Just wanted to let you know that Mr. More left before Cath could be done today. The case ahead of him took longer than anticipated and he was unwilling to wait. Very anxious fellow.  Peter Martinique MD, [facility]

## 2014-08-15 ENCOUNTER — Telehealth: Payer: Self-pay | Admitting: Adult Health

## 2014-08-15 NOTE — Telephone Encounter (Signed)
Patient needs to speak with nurse regarding his cath on 08/16/14 / tgs

## 2014-08-16 ENCOUNTER — Telehealth: Payer: Self-pay

## 2014-08-16 ENCOUNTER — Encounter (HOSPITAL_COMMUNITY): Admission: RE | Payer: Self-pay | Source: Ambulatory Visit

## 2014-08-16 ENCOUNTER — Ambulatory Visit (HOSPITAL_COMMUNITY): Admission: RE | Admit: 2014-08-16 | Payer: Medicare Other | Source: Ambulatory Visit | Admitting: Cardiology

## 2014-08-16 SURGERY — LEFT HEART CATHETERIZATION WITH CORONARY ANGIOGRAM
Anesthesia: LOCAL
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
## 2014-08-22 ENCOUNTER — Ambulatory Visit (HOSPITAL_COMMUNITY)
Admission: RE | Admit: 2014-08-22 | Discharge: 2014-08-23 | Disposition: A | Discharge status: Home / Self Care | Payer: Medicare Other | Source: Ambulatory Visit | Attending: Cardiovascular Disease | Admitting: Cardiovascular Disease

## 2014-08-22 ENCOUNTER — Encounter (HOSPITAL_COMMUNITY): Payer: Self-pay | Admitting: General Practice

## 2014-08-22 ENCOUNTER — Encounter (HOSPITAL_COMMUNITY)
Admission: RE | Disposition: A | Discharge status: Home / Self Care | Payer: Medicare Other | Source: Ambulatory Visit | Attending: Cardiovascular Disease

## 2014-08-22 DIAGNOSIS — Z794 Long term (current) use of insulin: Secondary | ICD-10-CM

## 2014-08-22 DIAGNOSIS — Z7982 Long term (current) use of aspirin: Secondary | ICD-10-CM | POA: Diagnosis not present

## 2014-08-22 DIAGNOSIS — M170 Bilateral primary osteoarthritis of knee: Secondary | ICD-10-CM | POA: Diagnosis not present

## 2014-08-22 DIAGNOSIS — I25110 Atherosclerotic heart disease of native coronary artery with unstable angina pectoris: Secondary | ICD-10-CM | POA: Insufficient documentation

## 2014-08-22 DIAGNOSIS — I10 Essential (primary) hypertension: Secondary | ICD-10-CM | POA: Diagnosis not present

## 2014-08-22 DIAGNOSIS — E119 Type 2 diabetes mellitus without complications: Secondary | ICD-10-CM | POA: Diagnosis not present

## 2014-08-22 DIAGNOSIS — I200 Unstable angina: Secondary | ICD-10-CM | POA: Diagnosis present

## 2014-08-22 DIAGNOSIS — Z8673 Personal history of transient ischemic attack (TIA), and cerebral infarction without residual deficits: Secondary | ICD-10-CM | POA: Diagnosis not present

## 2014-08-22 DIAGNOSIS — E1140 Type 2 diabetes mellitus with diabetic neuropathy, unspecified: Secondary | ICD-10-CM

## 2014-08-22 DIAGNOSIS — E1122 Type 2 diabetes mellitus with diabetic chronic kidney disease: Secondary | ICD-10-CM

## 2014-08-22 DIAGNOSIS — N184 Chronic kidney disease, stage 4 (severe): Secondary | ICD-10-CM

## 2014-08-22 DIAGNOSIS — E785 Hyperlipidemia, unspecified: Secondary | ICD-10-CM | POA: Diagnosis present

## 2014-08-22 DIAGNOSIS — Z955 Presence of coronary angioplasty implant and graft: Secondary | ICD-10-CM

## 2014-08-22 HISTORY — PX: PERCUTANEOUS CORONARY STENT INTERVENTION (PCI-S): SHX5485

## 2014-08-22 HISTORY — DX: Angina pectoris, unspecified: I20.9

## 2014-08-22 HISTORY — PX: LEFT HEART CATHETERIZATION WITH CORONARY ANGIOGRAM: SHX5451

## 2014-08-22 HISTORY — DX: Atherosclerotic heart disease of native coronary artery without angina pectoris: I25.10

## 2014-08-22 HISTORY — PX: CORONARY ANGIOPLASTY WITH STENT PLACEMENT: SHX49

## 2014-08-22 LAB — GLUCOSE, CAPILLARY
GLUCOSE-CAPILLARY: 122 mg/dL — AB (ref 70–99)
Glucose-Capillary: 172 mg/dL — ABNORMAL HIGH (ref 70–99)
Glucose-Capillary: 145 mg/dL — ABNORMAL HIGH (ref 70–99)
Glucose-Capillary: 178 mg/dL — ABNORMAL HIGH (ref 70–99)

## 2014-08-22 LAB — POCT ACTIVATED CLOTTING TIME: Activated Clotting Time: 638 seconds

## 2014-08-22 SURGERY — LEFT HEART CATHETERIZATION WITH CORONARY ANGIOGRAM
Anesthesia: LOCAL

## 2014-08-22 MED ORDER — CARVEDILOL 6.25 MG PO TABS
6.2500 mg | ORAL_TABLET | Freq: Two times a day (BID) | ORAL | Status: DC
  Administered 2014-08-22 – 2014-08-23 (×2): 6.25 mg via ORAL
  Filled 2014-08-22 (×4): qty 1

## 2014-08-22 MED ORDER — INSULIN GLARGINE 100 UNITS/ML SOLOSTAR PEN: 25.0000 [IU] | PEN_INJECTOR | Freq: Every day | SUBCUTANEOUS | Status: DC

## 2014-08-22 MED ORDER — MIDAZOLAM HCL 2 MG/2ML IJ SOLN
INTRAMUSCULAR | Status: AC
  Filled 2014-08-22: qty 2

## 2014-08-22 MED ORDER — SODIUM CHLORIDE 0.9 % IV SOLN: 250.0000 mL | INTRAVENOUS | Status: DC | PRN

## 2014-08-22 MED ORDER — QUINAPRIL HCL 10 MG PO TABS: 40.0000 mg | ORAL_TABLET | Freq: Every day | ORAL | Status: DC

## 2014-08-22 MED ORDER — ONDANSETRON HCL 4 MG/2ML IJ SOLN: 4.0000 mg | Freq: Four times a day (QID) | INTRAMUSCULAR | Status: DC | PRN

## 2014-08-22 MED ORDER — SODIUM CHLORIDE 0.9 % IV SOLN
INTRAVENOUS | Status: DC
Start: 2014-08-22 — End: 2014-08-22
  Administered 2014-08-22: 06:00:00 via INTRAVENOUS

## 2014-08-22 MED ORDER — VERAPAMIL HCL 2.5 MG/ML IV SOLN
INTRAVENOUS | Status: AC
  Filled 2014-08-22: qty 2

## 2014-08-22 MED ORDER — HEPARIN (PORCINE) IN NACL 2-0.9 UNIT/ML-% IJ SOLN
INTRAMUSCULAR | Status: AC
  Filled 2014-08-22: qty 1000

## 2014-08-22 MED ORDER — HEPARIN SODIUM (PORCINE) 1000 UNIT/ML IJ SOLN
INTRAMUSCULAR | Status: AC
  Filled 2014-08-22: qty 1

## 2014-08-22 MED ORDER — CLOPIDOGREL BISULFATE 75 MG PO TABS
75.0000 mg | ORAL_TABLET | Freq: Every day | ORAL | Status: DC
  Administered 2014-08-23: 08:00:00 75 mg via ORAL
  Filled 2014-08-22: qty 1

## 2014-08-22 MED ORDER — TAMSULOSIN HCL 0.4 MG PO CAPS
0.4000 mg | ORAL_CAPSULE | Freq: Every day | ORAL | Status: DC
  Administered 2014-08-22 – 2014-08-23 (×2): 0.4 mg via ORAL
  Filled 2014-08-22 (×2): qty 1

## 2014-08-22 MED ORDER — ACETAMINOPHEN 325 MG PO TABS: 650.0000 mg | ORAL_TABLET | ORAL | Status: DC | PRN

## 2014-08-22 MED ORDER — SODIUM CHLORIDE 0.9 % IV SOLN: INTRAVENOUS | Status: AC

## 2014-08-22 MED ORDER — TIMOLOL MALEATE 0.5 % OP SOLN
1.0000 [drp] | Freq: Two times a day (BID) | OPHTHALMIC | Status: DC
  Administered 2014-08-22 – 2014-08-23 (×2): 1 [drp] via OPHTHALMIC
  Filled 2014-08-22: qty 5

## 2014-08-22 MED ORDER — ISOSORBIDE MONONITRATE ER 30 MG PO TB24
30.0000 mg | ORAL_TABLET | Freq: Every day | ORAL | Status: DC
  Administered 2014-08-23: 10:00:00 30 mg via ORAL
  Filled 2014-08-22: qty 1

## 2014-08-22 MED ORDER — SODIUM CHLORIDE 0.9 % IJ SOLN: 3.0000 mL | INTRAMUSCULAR | Status: DC | PRN

## 2014-08-22 MED ORDER — LIDOCAINE HCL (PF) 1 % IJ SOLN
INTRAMUSCULAR | Status: AC
  Filled 2014-08-22: qty 30

## 2014-08-22 MED ORDER — FENTANYL CITRATE 0.05 MG/ML IJ SOLN
INTRAMUSCULAR | Status: AC
  Filled 2014-08-22: qty 2

## 2014-08-22 MED ORDER — SODIUM CHLORIDE 0.9 % IJ SOLN: 3.0000 mL | Freq: Two times a day (BID) | INTRAMUSCULAR | Status: DC

## 2014-08-22 MED ORDER — INSULIN GLARGINE 100 UNIT/ML ~~LOC~~ SOLN
25.0000 [IU] | Freq: Every day | SUBCUTANEOUS | Status: DC
  Administered 2014-08-22: 25 [IU] via SUBCUTANEOUS
  Filled 2014-08-22 (×3): qty 0.25

## 2014-08-22 MED ORDER — DUTASTERIDE 0.5 MG PO CAPS
0.5000 mg | ORAL_CAPSULE | Freq: Every day | ORAL | Status: DC
  Administered 2014-08-22 – 2014-08-23 (×2): 0.5 mg via ORAL
  Filled 2014-08-22 (×2): qty 1

## 2014-08-22 MED ORDER — CLOPIDOGREL BISULFATE 300 MG PO TABS
ORAL_TABLET | ORAL | Status: AC
  Filled 2014-08-22: qty 1

## 2014-08-22 MED ORDER — NITROGLYCERIN 0.4 MG SL SUBL: 0.4000 mg | SUBLINGUAL_TABLET | SUBLINGUAL | Status: DC | PRN

## 2014-08-22 MED ORDER — HYDRALAZINE HCL 20 MG/ML IJ SOLN
10.0000 mg | Freq: Four times a day (QID) | INTRAMUSCULAR | Status: DC | PRN
  Administered 2014-08-22 – 2014-08-23 (×3): 10 mg via INTRAVENOUS
  Filled 2014-08-22 (×3): qty 1

## 2014-08-22 MED ORDER — HYDRALAZINE HCL 20 MG/ML IJ SOLN
10.0000 mg | Freq: Once | INTRAMUSCULAR | Status: AC
  Administered 2014-08-22: 17:00:00 10 mg via INTRAVENOUS

## 2014-08-22 MED ORDER — BRIMONIDINE TARTRATE-TIMOLOL 0.2-0.5 % OP SOLN
1.0000 [drp] | Freq: Two times a day (BID) | OPHTHALMIC | Status: DC
  Filled 2014-08-22: qty 5

## 2014-08-22 MED ORDER — BRIMONIDINE TARTRATE 0.2 % OP SOLN
1.0000 [drp] | Freq: Two times a day (BID) | OPHTHALMIC | Status: DC
  Administered 2014-08-22 – 2014-08-23 (×2): 1 [drp] via OPHTHALMIC
  Filled 2014-08-22: qty 5

## 2014-08-22 MED ORDER — ASPIRIN 81 MG PO CHEW: 81.0000 mg | CHEWABLE_TABLET | ORAL | Status: DC

## 2014-08-22 MED ORDER — MENTHOL 3 MG MT LOZG
1.0000 | LOZENGE | OROMUCOSAL | Status: DC | PRN
  Administered 2014-08-22: 3 mg via ORAL
  Filled 2014-08-22: qty 9

## 2014-08-22 MED ORDER — GLIPIZIDE 2.5 MG HALF TABLET
2.5000 mg | ORAL_TABLET | Freq: Two times a day (BID) | ORAL | Status: DC
  Administered 2014-08-22 – 2014-08-23 (×2): 2.5 mg via ORAL
  Filled 2014-08-22 (×4): qty 1

## 2014-08-22 MED ORDER — BIVALIRUDIN 250 MG IV SOLR
INTRAVENOUS | Status: AC
  Filled 2014-08-22: qty 250

## 2014-08-22 MED ORDER — AMLODIPINE BESYLATE 5 MG PO TABS
5.0000 mg | ORAL_TABLET | Freq: Every day | ORAL | Status: DC
  Administered 2014-08-23: 10:00:00 5 mg via ORAL
  Filled 2014-08-22: qty 1

## 2014-08-22 MED ORDER — NITROGLYCERIN 1 MG/10 ML FOR IR/CATH LAB
INTRA_ARTERIAL | Status: AC
  Filled 2014-08-22: qty 10

## 2014-08-22 MED ORDER — LISINOPRIL 40 MG PO TABS
40.0000 mg | ORAL_TABLET | Freq: Every day | ORAL | Status: DC
  Administered 2014-08-22: 40 mg via ORAL
  Filled 2014-08-22 (×2): qty 1

## 2014-08-22 MED ORDER — ASPIRIN EC 81 MG PO TBEC
81.0000 mg | DELAYED_RELEASE_TABLET | Freq: Every day | ORAL | Status: DC
  Administered 2014-08-23: 10:00:00 81 mg via ORAL
  Filled 2014-08-22: qty 1

## 2014-08-22 NOTE — Interval H&P Note (Signed)
History and Physical Interval Note:  08/22/2014 8:12 AM  Jerry Burns  has presented today for cardiac cath with the diagnosis of abnormal stress test, unstable angina. The various methods of treatment have been discussed with the patient and family. After consideration of risks, benefits and other options for treatment, the patient has consented to  Procedure(s): LEFT HEART CATHETERIZATION WITH CORONARY ANGIOGRAM (N/A) as a surgical intervention .  The patient's history has been reviewed, patient examined, no change in status, stable for surgery.  I have reviewed the patient's chart and labs.  Questions were answered to the patient's satisfaction.    Cath Lab Visit (complete for each Cath Lab visit)  Clinical Evaluation Leading to the Procedure:   ACS: No.  Non-ACS:    Anginal Classification: CCS III  Anti-ischemic medical therapy: Maximal Therapy (2 or more classes of medications)  Non-Invasive Test Results: High-risk stress test findings: cardiac mortality >3%/year  Prior CABG: No previous CABG        Jill Ruppe

## 2014-08-22 NOTE — H&P (View-Only) (Signed)
HPI: Jerry Burns is a 81 year old patient of Dr. Bronson Ing, that we follow for ongoing assessment and management of CAD, distal small vessel disease, hypertension, with known history of diabetes.  The patient was last seen in the office in December 2015 after complaints of chest discomfort, and dyspnea walking approximately 100 feet.  He was planned for a stress Myoview and echocardiogram.  He had mildly elevated blood pressure on last office visit, but this was not treated and was anxious about his symptoms.   Today feeling about the same.  Has limited his exercise.      Nuclear medicine stress test was completed on 08/02/2014 , which revealed moderate inferior infarct, with mild peri- infarct ischemia ,moderate apical infarct, with moderate peri-infarct  ischemia.  High-risk stress test findings, left ventricular ejection fraction 52%.echocardiogram completed, the same day demonstrated LVEF of 60-65%, wall motion was normal, grade 2 diastolic dysfunction.  Aortic valve mildly calcified trileaflet mildly thickened, flutter valve, mildly calcified annulus, left atrium.  The atrium was severely dilated.  Right atrium was moderately dilated.  Atrial septum reveal increased thickness of the septum, consistent with lipomatous hypertrophy.  PA pressure 32 mm mercury.  .  IVC was dilated with normal respiratory variation with an estimated RA pressure of 8 mmHg   He has not had to take a nitroglycerin, sublingual.  He has been medically compliant.  Allergies  Allergen Reactions  . Latex Other (See Comments)    bandaids cause blisters after 24 hours     Current Outpatient Prescriptions  Medication Sig Dispense Refill  . amLODipine (NORVASC) 5 MG tablet TAKE 1 BY MOUTH DAILY 90 tablet 3  . aspirin 81 MG tablet Take 81 mg by mouth daily.    Marland Kitchen atorvastatin (LIPITOR) 40 MG tablet TAKE 1 BY MOUTH DAILY 90 tablet 0  . BAYER CONTOUR TEST test strip USE ONE STRIP TO TEST BLOOD SUGAR EVERY DAY AS DIRECTED 50  each 0  . carvedilol (COREG) 6.25 MG tablet TAKE (1) TABLET BY MOUTH TWICE DAILY. 60 tablet 3  . dutasteride (AVODART) 0.5 MG capsule Take 1 capsule (0.5 mg total) by mouth daily. 90 capsule 1  . glipiZIDE (GLUCOTROL) 5 MG tablet Take 0.5 tablets (2.5 mg total) by mouth 2 (two) times daily before a meal. 30 tablet 2  . isosorbide mononitrate (IMDUR) 30 MG 24 hr tablet Take 1 tablet (30 mg total) by mouth daily. 90 tablet 0  . LANTUS SOLOSTAR 100 UNIT/ML Solostar Pen INJECT 31 TO 34 UNITS TOTAL INTO THE SKIN AT BEDTIME 30 mL 0  . metFORMIN (GLUCOPHAGE) 1000 MG tablet Take 1 tablet (1,000 mg total) by mouth 2 (two) times daily with a meal. 180 tablet 3  . Multiple Vitamin (MULTIVITAMIN) tablet Take 1 tablet by mouth daily.    . nitroGLYCERIN (NITROSTAT) 0.4 MG SL tablet Place 1 tablet (0.4 mg total) under the tongue every 5 (five) minutes as needed for chest pain. 90 tablet 3  . quinapril (ACCUPRIL) 40 MG tablet TAKE 1 BY MOUTH AT BEDTIME 90 tablet 0  . RELION MINI PEN NEEDLES 31G X 6 MM MISC USE AS DIRECTED 100 each 0  . sulfacetamide (BLEPH-10) 10 % ophthalmic solution INSTILL 4 DROPS INTO THE AFFECTED EYE(S) 4 TIMES DAILY FOR 5 DAYS. 15 mL 0  . tamsulosin (FLOMAX) 0.4 MG CAPS capsule TAKE 1 BY MOUTH DAILY 90 capsule 0   No current facility-administered medications for this visit.    Past Medical History  Diagnosis Date  .  Diabetes mellitus   . Hyperlipidemia   . Stroke   . Arthritis     Bilateral knee  . Hypertension     Past Surgical History  Procedure Laterality Date  . Tonsillectomy    . Appendectomy    . Shoulder surgery    . Colonoscopy      ROS: Complete review of systems performed and found to be negative unless outlined above  PHYSICAL EXAM BP 130/70 mmHg  Pulse 63  Ht 6' (1.829 m)  Wt 222 lb (100.699 kg)  BMI 30.10 kg/m2  SpO2 95% General: Well developed, well nourished, in no acute distress Head: Eyes PERRLA, No xanthomas.   Normal cephalic and  atramatic  Lungs: Clear bilaterally to auscultation and percussion. Heart: HRRR S1 S2, without MRG.  Pulses are 2+ & equal.            No carotid bruit. No JVD.  No abdominal bruits. No femoral bruits. Abdomen: Bowel sounds are positive, abdomen soft and non-tender without masses or                  Hernia's noted. Msk:  Back normal, normal gait. Normal strength and tone for age. Extremities: No clubbing, cyanosis or edema.  DP +1 Neuro: Alert and oriented X 3. Psych:  Good affect, responds appropriately   ASSESSMENT AND PLAN

## 2014-08-22 NOTE — CV Procedure (Signed)
Cardiac Catheterization Operative Report  Jerry Burns [id number] 1/27/20168:25 AM Sallee Lange, MD  Procedure Performed:  1. Left Heart Catheterization 2. Selective Coronary Angiography 3. Left ventricular angiogram 4. PTCA/DES x 1 mid LAD 5. Angioseal right femoral artery  Operator: Lauree Chandler, MD  Arterial access site:  Right radial artery and right femoral artery  Indication: 81 yo male with history of CAD, HTN, DM with recent chest pain c/w unstable angina. High risk stress myoview with possible inferior and anteroapical ischemia.                                      Procedure Details: The risks, benefits, complications, treatment options, and expected outcomes were discussed with the patient. The patient and/or family concurred with the proposed plan, giving informed consent. The patient was brought to the cath lab after IV hydration was begun and oral premedication was given. The patient was further sedated with Versed and Fentanyl. The right wrist was assessed with a modified Allens test which was positive. The right wrist was prepped and draped in a sterile fashion. 1% lidocaine was used for local anesthesia. Using the modified Seldinger access technique, a 5 French sheath was placed in the right radial artery. 3 mg Verapamil was given through the sheath. I was unable to advance catheters into the aortic root due to severe tortuosity in the innominate artery. I then prepped the right groin. A 5 French sheath was placed in the right femoral artery. Standard diagnostic catheters were used to perform selective coronary angiography. The RCA was engaged with a [place] catheter. A pigtail catheter was used to measure LV pressures. No LV gram. Severe stenosis mid LAD and I elected to proceed to PCI of the LAD.   PCI Note: Sheath upsized to 6 [place] system. Angiomax weight based bolus and drip. Plavix 600 mg po x 1. XB LAD 3.5 guiding catheter to engage left main. Cougar  IC wire down LAD when ACT over 200. 2.25 x 12 mm balloon x 1 mid LAD. 2.5 x 16 mm Promus Premier DES x 1 mid LAD. Stent post-dilated with a 2.5 x 12 mm [place] balloon x 1. Stenosis taken from 90% down to 0%. Angioseal right femoral artery. The sheath was removed from the right radial artery and a Terumo hemostasis band was applied at the arteriotomy site on the right wrist.   There were no immediate complications. The patient was taken to the recovery area in stable condition.   Hemodynamic Findings: Central aortic pressure: 142/64 Left ventricular pressure: 141/13/24  Angiographic Findings:  Left main: No obstructive disease.   Left Anterior Descending Artery: Large caliber vessel that courses to the apex. The proximal and mid vessel has diffuse 40% stenosis but no lesions that appear to be flow limiting. There is a focal 90% stenosis in the mid segment. The apical LAD has mild plaque disease. There are three diagonal branches. The first two are moderate in caliber and have mild plaque disease. The third diagonal branch is small in caliber with 60% ostial stenosis.   Circumflex Artery: Moderate to large caliber vessel with 30% proximal stenosis. The obtuse marginal branch is moderate in caliber with diffuse mild plaque.   Right Coronary Artery: Large caliber dominant vessel with diffuse 20% stenosis in the mid vessel. The PDA and posterolateral branches are moderate in caliber. The PDa has mild plaque. The posterolateral branch  has a 50% proximal stenosis.   Left Ventricular Angiogram: Deferred.   Impression: 1. Double vessel CAD 2. Severe stenosis mid LAD/unstable angina 3. Successful PTCA/DES x mid LAD 4. Moderate residual disease proximal and mid LAD and posterolateral branch of the RCA  Recommendations: Will continue ASA and Plavix for one year. Continue beta blocker and statin. Medical management of the moderate disease as described above.        Complications:  None. The patient  tolerated the procedure well.

## 2014-08-22 NOTE — Progress Notes (Signed)
Pt denies complaints except scratchy throat.  Amb to bathroom and back without CP or SOB.  Face flushed.  No fever.  BP 196/64, Tele shows SR 72.  PM dose of Coreg given.  Sugar-free throat lozenges ordered.  Rt radial level 0, Rt groin has shadow drainage.  Drsg removed, no active bleeding, no bruise.  Site redressed w/ 2x2 and tegaderm.

## 2014-08-22 NOTE — Progress Notes (Signed)
Cecilie Kicks NP notified of elevated BP, meds given, and scratchy throat.  Orders received.

## 2014-08-22 NOTE — Progress Notes (Signed)
TR BAND REMOVAL  LOCATION:    right radial  DEFLATED PER PROTOCOL:    Yes.    TIME BAND OFF / DRESSING APPLIED:    1345   SITE UPON ARRIVAL:    Level 0  SITE AFTER BAND REMOVAL:    Level 0  REVERSE ALLEN'S TEST:     positive  CIRCULATION SENSATION AND MOVEMENT:    Within Normal Limits   Yes.    COMMENTS:   PATIENT TOLERATED WELL. STERILE DRESSING APPLIED C/D/I. NO BLEEDING NOTED.

## 2014-08-23 ENCOUNTER — Other Ambulatory Visit: Payer: Self-pay | Admitting: Family Medicine

## 2014-08-23 ENCOUNTER — Encounter (HOSPITAL_COMMUNITY): Payer: Self-pay | Admitting: Physician Assistant

## 2014-08-23 DIAGNOSIS — I200 Unstable angina: Secondary | ICD-10-CM | POA: Diagnosis not present

## 2014-08-23 DIAGNOSIS — E785 Hyperlipidemia, unspecified: Secondary | ICD-10-CM

## 2014-08-23 DIAGNOSIS — I25110 Atherosclerotic heart disease of native coronary artery with unstable angina pectoris: Secondary | ICD-10-CM | POA: Diagnosis not present

## 2014-08-23 DIAGNOSIS — Z8673 Personal history of transient ischemic attack (TIA), and cerebral infarction without residual deficits: Secondary | ICD-10-CM | POA: Diagnosis not present

## 2014-08-23 DIAGNOSIS — E119 Type 2 diabetes mellitus without complications: Secondary | ICD-10-CM

## 2014-08-23 DIAGNOSIS — I10 Essential (primary) hypertension: Secondary | ICD-10-CM

## 2014-08-23 DIAGNOSIS — Z7982 Long term (current) use of aspirin: Secondary | ICD-10-CM | POA: Diagnosis not present

## 2014-08-23 LAB — BASIC METABOLIC PANEL
Anion gap: 8 (ref 5–15)
BUN: 14 mg/dL (ref 6–23)
CO2: 25 mmol/L (ref 19–32)
Calcium: 9.5 mg/dL (ref 8.4–10.5)
Chloride: 104 mmol/L (ref 96–112)
Creatinine, Ser: 1.04 mg/dL (ref 0.50–1.35)
GFR calc non Af Amer: 67 mL/min — ABNORMAL LOW (ref >90)
GFR, EST AFRICAN AMERICAN: 77 mL/min — AB (ref >90)
Glucose, Bld: 172 mg/dL — ABNORMAL HIGH (ref 70–99)
POTASSIUM: 3.9 mmol/L (ref 3.5–5.1)
SODIUM: 137 mmol/L (ref 135–145)

## 2014-08-23 LAB — CBC
HEMATOCRIT: 38.8 % — AB (ref 39.0–52.0)
HEMOGLOBIN: 13.2 g/dL (ref 13.0–17.0)
MCH: 30.1 pg (ref 26.0–34.0)
MCHC: 34 g/dL (ref 30.0–36.0)
MCV: 88.4 fL (ref 78.0–100.0)
Platelets: 184 10*3/uL (ref 150–400)
RBC: 4.39 MIL/uL (ref 4.22–5.81)
RDW: 13 % (ref 11.5–15.5)
WBC: 5.3 10*3/uL (ref 4.0–10.5)

## 2014-08-23 LAB — GLUCOSE, CAPILLARY: Glucose-Capillary: 170 mg/dL — ABNORMAL HIGH (ref 70–99)

## 2014-08-23 MED ORDER — CLOPIDOGREL BISULFATE 75 MG PO TABS: 75.0000 mg | ORAL_TABLET | Freq: Every day | ORAL | Status: DC

## 2014-08-23 MED ORDER — METFORMIN HCL 1000 MG PO TABS: 1000.0000 mg | ORAL_TABLET | Freq: Two times a day (BID) | ORAL | Status: DC

## 2014-08-23 MED ORDER — ATORVASTATIN CALCIUM 40 MG PO TABS
40.0000 mg | ORAL_TABLET | Freq: Every day | ORAL | Status: DC
  Filled 2014-08-23: qty 1

## 2014-08-23 MED FILL — Sodium Chloride IV Soln 0.9%: INTRAVENOUS | Qty: 50 | Status: AC

## 2014-08-23 NOTE — Progress Notes (Signed)
CARDIAC REHAB PHASE I   PRE:  Rate/Rhythm: 73 SR    BP: sitting 162/52 (right arm per pt)    SaO2:   MODE:  Ambulation: 1000 ft   POST:  Rate/Rhythm: 90 with PVC    BP: sitting left 201/51, right 171/55    SaO2:   Tolerated well without CP. Did have some wheezing which he sts is normal.  BP still elevated. Just received IV hydralazine per RN. Apparently left arm runs higher than right. Ed completed, pt very receptive. Interested in [facility] and will send referral to [place]. [id number]  Josephina Shih [place] CES, ACSM 08/23/2014 9:02 AM

## 2014-08-23 NOTE — Progress Notes (Signed)
CARDIOLOGY DISCHARGE SUMMARY   Patient ID: Jerry Burns MRN: [id number] DOB/AGE: 1933/10/12 81 y.o.  Admit date: 08/22/2014 Discharge date: 08/23/2014  PCP: Sallee Lange, MD Primary Cardiologist: Dr. Bronson Ing  Primary Discharge Diagnosis:   Unstable angina Secondary Discharge Diagnosis:    Hyperlipidemia   Essential hypertension   Diabetes type 2, controlled  Procedure Performed:  1. Left Heart Catheterization 2. Selective Coronary Angiography 3. Left ventricular angiogram 4. PTCA/DES x 1 mid LAD 5. Angioseal right femoral artery  Hospital Course: Jerry Burns is a 81 y.o. male with a history of CAD, HTN, DM. He was seen in the office for chest pain. A Nuc stress was abnormal and he was referred for cath. He came to the hospital for the procedure on 01/27.  Cardiac catheterization results are below. He had a drug-eluting stent the LAD and tolerated the procedure well.  On 08/23/2014 he was seen by Dr. Claiborne Billings and all data were reviewed. He is to continue on his home diabetes medications but hold Glucophage for 48 hours. His blood pressure is well controlled. He is tolerating dual antiplatelet therapy well. His blood pressure at his office visit was fairly well-controlled but his blood pressures in the hospital were running high. He is encouraged to check his blood pressure at home and follow-up in the office.  He was seen by cardiac rehabilitation and educated on stent restrictions, exercise guidelines at heart healthy lifestyle modifications. He was ambulating without chest pain or shortness of breath. No further inpatient workup is indicated and he is considered stable for discharge, to follow-up as an outpatient.  Labs:   Lab Results  Component Value Date   WBC 5.3 08/23/2014   HGB 13.2 08/23/2014   HCT 38.8* 08/23/2014   MCV 88.4 08/23/2014   PLT 184 08/23/2014     Recent Labs Lab 08/23/14 0525  NA 137  K 3.9  CL 104  CO2 25  BUN 14  CREATININE  1.04  CALCIUM 9.5  GLUCOSE 172*    Cardiac Cath: 08/22/2014 Left main: No obstructive disease.  Left Anterior Descending Artery: Large caliber vessel that courses to the apex. The proximal and mid vessel has diffuse 40% stenosis but no lesions that appear to be flow limiting. There is a focal 90% stenosis in the mid segment. The apical LAD has mild plaque disease. There are three diagonal branches. The first two are moderate in caliber and have mild plaque disease. The third diagonal branch is small in caliber with 60% ostial stenosis.  Circumflex Artery: Moderate to large caliber vessel with 30% proximal stenosis. The obtuse marginal branch is moderate in caliber with diffuse mild plaque.  Right Coronary Artery: Large caliber dominant vessel with diffuse 20% stenosis in the mid vessel. The PDA and posterolateral branches are moderate in caliber. The PDa has mild plaque. The posterolateral branch has a 50% proximal stenosis.  Left Ventricular Angiogram: Deferred.  Impression: 1. Double vessel CAD 2. Severe stenosis mid LAD/unstable angina 3. Successful PTCA/DES x mid LAD 4. Moderate residual disease proximal and mid LAD and posterolateral branch of the RCA Recommendations: Will continue ASA and Plavix for one year. Continue beta blocker and statin. Medical management of the moderate disease as described above.   EKG: 08/23/2014 Sinus rhythm, LVH with QRS widening Vent. rate 75 BPM PR interval 190 ms QRS duration 120 ms QT/QTc 412/460 ms P-R-T axes 56 -61 61  FOLLOW UP PLANS AND APPOINTMENTS Allergies  Allergen Reactions  . Latex Other (See  Comments)    bandaids cause blisters after 24 hours      Medication List    TAKE these medications        amLODipine 5 MG tablet  Commonly known as:  NORVASC  TAKE 1 BY MOUTH DAILY     aspirin EC 81 MG tablet  Take 81 mg by mouth daily.     atorvastatin 40 MG tablet  Commonly known as:  LIPITOR  TAKE 1 BY MOUTH DAILY     BAYER  CONTOUR TEST test strip  Generic drug:  glucose blood  USE ONE STRIP TO TEST BLOOD SUGAR EVERY DAY AS DIRECTED     brimonidine-timolol 0.2-0.5 % ophthalmic solution  Commonly known as:  COMBIGAN  Place 1 drop into the left eye 2 (two) times daily.     carvedilol 6.25 MG tablet  Commonly known as:  COREG  TAKE (1) TABLET BY MOUTH TWICE DAILY.     clopidogrel 75 MG tablet  Commonly known as:  PLAVIX  Take 1 tablet (75 mg total) by mouth daily with breakfast.     dutasteride 0.5 MG capsule  Commonly known as:  AVODART  TAKE 1 BY MOUTH DAILY     glipiZIDE 5 MG tablet  Commonly known as:  GLUCOTROL  Take 0.5 tablets (2.5 mg total) by mouth 2 (two) times daily before a meal.     isosorbide mononitrate 30 MG 24 hr tablet  Commonly known as:  IMDUR  Take 1 tablet (30 mg total) by mouth daily.     LANTUS SOLOSTAR 100 UNIT/ML Solostar Pen  Generic drug:  Insulin Glargine  INJECT 31 TO 34 UNITS TOTAL INTO THE SKIN AT BEDTIME     metFORMIN 1000 MG tablet  Commonly known as:  GLUCOPHAGE  Take 1 tablet (1,000 mg total) by mouth 2 (two) times daily with a meal. HOLD for 48 hours, restart on 08/25/2014.     multivitamin tablet  Take 1 tablet by mouth daily.     nitroGLYCERIN 0.4 MG SL tablet  Commonly known as:  NITROSTAT  Place 1 tablet (0.4 mg total) under the tongue every 5 (five) minutes as needed for chest pain.     quinapril 40 MG tablet  Commonly known as:  ACCUPRIL  TAKE 1 BY MOUTH AT BEDTIME     RELION MINI PEN NEEDLES 31G X 6 MM Misc  Generic drug:  Insulin Pen Needle  USE AS DIRECTED     sulfacetamide 10 % ophthalmic solution  Commonly known as:  BLEPH-10  INSTILL 4 DROPS INTO THE AFFECTED EYE(S) 4 TIMES DAILY FOR 5 DAYS.     tamsulosin 0.4 MG Caps capsule  Commonly known as:  FLOMAX  TAKE 1 BY MOUTH DAILY        Discharge Instructions    Amb Referral to Cardiac Rehabilitation    Complete by:  As directed      Diet - low sodium heart healthy    Complete by:   As directed      Diet Carb Modified    Complete by:  As directed      Increase activity slowly    Complete by:  As directed           Follow-up Information    Follow up with Jory Sims, NP.   Specialty:  Nurse Practitioner   Why:  The office will call   Contact information:   [place] [address] [phone]  BRING ALL MEDICATIONS WITH YOU TO FOLLOW UP APPOINTMENTS  Time spent with patient to include physician time: 47 min Signed: Rosaria Ferries, PA-C 08/23/2014, 10:59 AM Co-Sign MD

## 2014-08-23 NOTE — Progress Notes (Signed)
Patient Name: Jerry Burns Date of Encounter: 08/23/2014  Principal Problem:   Unstable angina Active Problems:   Hyperlipidemia   Essential hypertension   Diabetes type 2, controlled   Primary Cardiologist: Dr. Bronson Ing  Patient Profile: 81 yo male w/ hx CAD, HTN, DM. CP-->Nuc stress abnl, s/p cath 01/27 w/ DES LAD.   SUBJECTIVE: No chest pain or shortness of breath overnight.   OBJECTIVE Filed Vitals:   08/22/14 2215 08/22/14 2355 08/23/14 0029 08/23/14 0532  BP: 131/40 166/39  175/59  Pulse:  78  75  Temp:  97.8 F (36.6 C)  97.5 F (36.4 C)  TempSrc:  Oral  Oral  Resp:  18  20  Height:      Weight:   204 lb 9.4 oz (92.8 kg)   SpO2:  98%  97%    Intake/Output Summary (Last 24 hours) at 08/23/14 0737 Last data filed at 08/22/14 2000  Gross per 24 hour  Intake 1118.75 ml  Output   2825 ml  Net -1706.25 ml   Filed Weights   08/22/14 0557 08/23/14 0029  Weight: 220 lb (99.791 kg) 204 lb 9.4 oz (92.8 kg)    PHYSICAL EXAM General: Well developed, well nourished, male in no acute distress. Head: Normocephalic, atraumatic.  Neck: Supple without bruits, JVD not elevated. Lungs:  Resp regular and unlabored, CTA. Heart: RRR, S1, S2, no S3, S4, or murmur; no rub. Abdomen: Soft, non-tender, non-distended, BS + x 4.  Extremities: No clubbing, cyanosis, no edema. Right radial cath site without ecchymosis or hematoma Neuro: Alert and oriented X 3. Moves all extremities spontaneously. Psych: Normal affect.  LABS: CBC:  Recent Labs  08/23/14 0525  WBC 5.3  HGB 13.2  HCT 38.8*  MCV 88.4  PLT 184   TELE:   Sinus rhythm, occasional PVCs    Cath: 08/22/2014 Angiographic Findings: Left main: No obstructive disease.  Left Anterior Descending Artery: Large caliber vessel that courses to the apex. The proximal and mid vessel has diffuse 40% stenosis but no lesions that appear to be flow limiting. There is a focal 90% stenosis in the mid segment. The  apical LAD has mild plaque disease. There are three diagonal branches. The first two are moderate in caliber and have mild plaque disease. The third diagonal branch is small in caliber with 60% ostial stenosis.  Circumflex Artery: Moderate to large caliber vessel with 30% proximal stenosis. The obtuse marginal branch is moderate in caliber with diffuse mild plaque.  Right Coronary Artery: Large caliber dominant vessel with diffuse 20% stenosis in the mid vessel. The PDA and posterolateral branches are moderate in caliber. The PDa has mild plaque. The posterolateral branch has a 50% proximal stenosis.  Left Ventricular Angiogram: Deferred.  Impression: 1. Double vessel CAD 2. Severe stenosis mid LAD/unstable angina 3. Successful PTCA/DES x mid LAD 4. Moderate residual disease proximal and mid LAD and posterolateral branch of the RCA Recommendations: Will continue ASA and Plavix for one year. Continue beta blocker and statin. Medical management of the moderate disease as described above.   Current Medications:  . amLODipine  5 mg Oral Daily  . aspirin EC  81 mg Oral Daily  . atorvastatin  40 mg Oral Daily  . brimonidine  1 drop Left Eye BID   And  . timolol  1 drop Left Eye BID  . carvedilol  6.25 mg Oral BID WC  . clopidogrel  75 mg Oral Q breakfast  . dutasteride  0.5 mg Oral Daily  . glipiZIDE  2.5 mg Oral BID AC  . insulin glargine  25 Units Subcutaneous QHS  . isosorbide mononitrate  30 mg Oral Daily  . lisinopril  40 mg Oral QHS  . tamsulosin  0.4 mg Oral Daily      ASSESSMENT AND PLAN:    Unstable angina - s/p cath w/ DES LAD. On ASA, beta blocker, Plavix, Imdur, ACE inhibitor    HTN - AP 130/70 in the office, has been elevated here, but medicines are all scheduled due to procedure. Continue home medications, try blood pressure at home and follow-up in the office. May tolerate and increase in his carvedilol, or need to increase his amlodipine.    DM - Glucophage is on hold,  continue other home medications and resume Glucophage in 48 hours    Hyperlipidemia - continue statin  Plan: Discharge today.  Signed, Rosaria Ferries , PA-C 7:37 AM 08/23/2014    Patient seen and examined. Agree with assessment and plan. No chest pain or SOB. Ambulating. S?P PCI to LAD. DC today   Troy Sine, MD, [facility] 08/23/2014 10:01 AM

## 2014-08-27 ENCOUNTER — Other Ambulatory Visit: Payer: Self-pay | Admitting: Family Medicine

## 2014-08-27 NOTE — Discharge Summary (Signed)
CARDIOLOGY DISCHARGE SUMMARY   Patient ID: Jerry Burns MRN: [id number] DOB/AGE: 79-Jul-1935 79 y.o.  Admit date: 08/22/2014 Discharge date: 08/23/2014  PCP: Sallee Lange, MD Primary Cardiologist: Dr. Bronson Ing  Primary Discharge Diagnosis: Unstable angina Secondary Discharge Diagnosis:   Hyperlipidemia  Essential hypertension  Diabetes type 2, controlled  Procedure Performed:  1. Left Heart Catheterization 2. Selective Coronary Angiography 3. Left ventricular angiogram 4. PTCA/DES x 1 mid LAD 5. Angioseal right femoral artery  Hospital Course: Jerry Burns is a 81 y.o. male with a history of CAD, HTN, DM. He was seen in the office for chest pain. A Nuc stress was abnormal and he was referred for cath. He came to the hospital for the procedure on 01/27.  Cardiac catheterization results are below. He had a drug-eluting stent the LAD and tolerated the procedure well.  On 08/23/2014 he was seen by Dr. Claiborne Billings and all data were reviewed. He is to continue on his home diabetes medications but hold Glucophage for 48 hours. His blood pressure is well controlled. He is tolerating dual antiplatelet therapy well. His blood pressure at his office visit was fairly well-controlled but his blood pressures in the hospital were running high. He is encouraged to check his blood pressure at home and follow-up in the office.  He was seen by cardiac rehabilitation and educated on stent restrictions, exercise guidelines at heart healthy lifestyle modifications. He was ambulating without chest pain or shortness of breath. No further inpatient workup is indicated and he is considered stable for discharge, to follow-up as an outpatient.  Labs:   Recent Labs    Lab Results  Component Value Date   WBC 5.3 08/23/2014   HGB 13.2 08/23/2014   HCT 38.8* 08/23/2014   MCV 88.4 08/23/2014   PLT 184 08/23/2014       Last Labs      Recent Labs Lab  08/23/14 0525  NA 137  K 3.9  CL 104  CO2 25  BUN 14  CREATININE 1.04  CALCIUM 9.5  GLUCOSE 172*      Cardiac Cath: 08/22/2014 Left main: No obstructive disease.  Left Anterior Descending Artery: Large caliber vessel that courses to the apex. The proximal and mid vessel has diffuse 40% stenosis but no lesions that appear to be flow limiting. There is a focal 90% stenosis in the mid segment. The apical LAD has mild plaque disease. There are three diagonal branches. The first two are moderate in caliber and have mild plaque disease. The third diagonal branch is small in caliber with 60% ostial stenosis.  Circumflex Artery: Moderate to large caliber vessel with 30% proximal stenosis. The obtuse marginal branch is moderate in caliber with diffuse mild plaque.  Right Coronary Artery: Large caliber dominant vessel with diffuse 20% stenosis in the mid vessel. The PDA and posterolateral branches are moderate in caliber. The PDa has mild plaque. The posterolateral branch has a 50% proximal stenosis.  Left Ventricular Angiogram: Deferred.  Impression: 1. Double vessel CAD 2. Severe stenosis mid LAD/unstable angina 3. Successful PTCA/DES x mid LAD 4. Moderate residual disease proximal and mid LAD and posterolateral branch of the RCA Recommendations: Will continue ASA and Plavix for one year. Continue beta blocker and statin. Medical management of the moderate disease as described above.   EKG: 08/23/2014 Sinus rhythm, LVH with QRS widening Vent. rate 75 BPM PR interval 190 ms QRS duration 120 ms QT/QTc 412/460 ms P-R-T axes 56 -61 61  FOLLOW UP PLANS  AND APPOINTMENTS Allergies  Allergen Reactions  . Latex Other (See Comments)    bandaids cause blisters after 24 hours      Medication List    TAKE these medications       amLODipine 5 MG tablet  Commonly known as: NORVASC  TAKE 1 BY MOUTH DAILY     aspirin EC 81 MG tablet  Take 81 mg  by mouth daily.     atorvastatin 40 MG tablet  Commonly known as: LIPITOR  TAKE 1 BY MOUTH DAILY     BAYER CONTOUR TEST test strip  Generic drug: glucose blood  USE ONE STRIP TO TEST BLOOD SUGAR EVERY DAY AS DIRECTED     brimonidine-timolol 0.2-0.5 % ophthalmic solution  Commonly known as: COMBIGAN  Place 1 drop into the left eye 2 (two) times daily.     carvedilol 6.25 MG tablet  Commonly known as: COREG  TAKE (1) TABLET BY MOUTH TWICE DAILY.     clopidogrel 75 MG tablet  Commonly known as: PLAVIX  Take 1 tablet (75 mg total) by mouth daily with breakfast.     dutasteride 0.5 MG capsule  Commonly known as: AVODART  TAKE 1 BY MOUTH DAILY     glipiZIDE 5 MG tablet  Commonly known as: GLUCOTROL  Take 0.5 tablets (2.5 mg total) by mouth 2 (two) times daily before a meal.     isosorbide mononitrate 30 MG 24 hr tablet  Commonly known as: IMDUR  Take 1 tablet (30 mg total) by mouth daily.     LANTUS SOLOSTAR 100 UNIT/ML Solostar Pen  Generic drug: Insulin Glargine  INJECT 31 TO 34 UNITS TOTAL INTO THE SKIN AT BEDTIME     metFORMIN 1000 MG tablet  Commonly known as: GLUCOPHAGE  Take 1 tablet (1,000 mg total) by mouth 2 (two) times daily with a meal. HOLD for 48 hours, restart on 08/25/2014.     multivitamin tablet  Take 1 tablet by mouth daily.     nitroGLYCERIN 0.4 MG SL tablet  Commonly known as: NITROSTAT  Place 1 tablet (0.4 mg total) under the tongue every 5 (five) minutes as needed for chest pain.     quinapril 40 MG tablet  Commonly known as: ACCUPRIL  TAKE 1 BY MOUTH AT BEDTIME     RELION MINI PEN NEEDLES 31G X 6 MM Misc  Generic drug: Insulin Pen Needle  USE AS DIRECTED     sulfacetamide 10 % ophthalmic solution  Commonly known as: BLEPH-10  INSTILL 4 DROPS INTO THE AFFECTED EYE(S) 4 TIMES DAILY FOR 5 DAYS.     tamsulosin 0.4 MG Caps capsule  Commonly known as:  FLOMAX  TAKE 1 BY MOUTH DAILY        Discharge Instructions    Amb Referral to Cardiac Rehabilitation  Complete by: As directed      Diet - low sodium heart healthy  Complete by: As directed      Diet Carb Modified  Complete by: As directed      Increase activity slowly  Complete by: As directed           Follow-up Information    Follow up with Jory Sims, NP.   Specialty: Nurse Practitioner   Why: The office will call   Contact information:   [place] [address] [phone]       BRING ALL MEDICATIONS WITH YOU TO FOLLOW UP APPOINTMENTS  Time spent with patient to include physician time: 25  min Signed: Rosaria Ferries, PA-C 08/23/2014, 10:59 AM Co-Sign MD

## 2014-08-30 NOTE — Progress Notes (Signed)
   ERROR- Cancelled and rescheduled.  

## 2014-08-31 ENCOUNTER — Encounter: Payer: Medicare Other | Admitting: Adult Health

## 2014-09-03 ENCOUNTER — Encounter: Payer: Self-pay | Admitting: Adult Health

## 2014-09-03 ENCOUNTER — Ambulatory Visit (INDEPENDENT_AMBULATORY_CARE_PROVIDER_SITE_OTHER): Payer: Medicare Other | Admitting: Adult Health

## 2014-09-03 ENCOUNTER — Telehealth: Payer: Self-pay | Admitting: Family Medicine

## 2014-09-03 ENCOUNTER — Other Ambulatory Visit: Payer: Self-pay | Admitting: Cardiovascular Disease

## 2014-09-03 VITALS — BP 146/70 | HR 61 | Ht 72.0 in | Wt 223.0 lb

## 2014-09-03 DIAGNOSIS — I2510 Atherosclerotic heart disease of native coronary artery without angina pectoris: Secondary | ICD-10-CM | POA: Insufficient documentation

## 2014-09-03 DIAGNOSIS — I25118 Atherosclerotic heart disease of native coronary artery with other forms of angina pectoris: Secondary | ICD-10-CM | POA: Insufficient documentation

## 2014-09-03 MED ORDER — GLUCOSE BLOOD VI STRP: ORAL_STRIP | Status: DC

## 2014-09-03 NOTE — Progress Notes (Deleted)
Name: Jerry Burns    DOB: 03/08/1934  Age: 81 y.o.  MR#: [id number]       PCP:  Sallee Lange, MD      Insurance: Payor: MEDICARE / Plan: MEDICARE PART A AND B / Product Type: *No Product type* /   CC:    Chief Complaint  Patient presents with  . Coronary Artery Disease    Status post drug-eluting stent to the mid LAD  . Hypertension    VS Filed Vitals:   09/03/14 1454  BP: 146/70  Pulse: 61  Height: 6' (1.829 m)  Weight: 223 lb (101.152 kg)  SpO2: 95%    Weights Current Weight  09/03/14 223 lb (101.152 kg)  08/23/14 204 lb 9.4 oz (92.8 kg)  08/13/14 220 lb (99.791 kg)    Blood Pressure  BP Readings from Last 3 Encounters:  09/03/14 146/70  08/23/14 201/51  08/13/14 103/59     Admit date:  (Not on file) Last encounter with RMR:  08/15/2014   Allergy Latex  Current Outpatient Prescriptions  Medication Sig Dispense Refill  . amLODipine (NORVASC) 5 MG tablet TAKE 1 BY MOUTH DAILY 90 tablet 3  . aspirin EC 81 MG tablet Take 81 mg by mouth daily.    Marland Kitchen atorvastatin (LIPITOR) 40 MG tablet TAKE 1 BY MOUTH DAILY 90 tablet 0  . brimonidine-timolol (COMBIGAN) 0.2-0.5 % ophthalmic solution Place 1 drop into the left eye 2 (two) times daily.    . carvedilol (COREG) 6.25 MG tablet TAKE (1) TABLET BY MOUTH TWICE DAILY. 60 tablet 3  . clopidogrel (PLAVIX) 75 MG tablet Take 1 tablet (75 mg total) by mouth daily with breakfast. 30 tablet 11  . dutasteride (AVODART) 0.5 MG capsule TAKE 1 BY MOUTH DAILY 90 capsule 2  . glipiZIDE (GLUCOTROL) 5 MG tablet Take 0.5 tablets (2.5 mg total) by mouth 2 (two) times daily before a meal. 30 tablet 2  . glucose blood (BAYER CONTOUR TEST) test strip USE TO TEST TWICE DAILY 100 each 1  . isosorbide mononitrate (IMDUR) 30 MG 24 hr tablet Take 1 tablet (30 mg total) by mouth daily. 90 tablet 0  . LANTUS SOLOSTAR 100 UNIT/ML Solostar Pen INJECT 31 TO 34 UNITS TOTAL INTO THE SKIN AT BEDTIME 30 mL 0  . metFORMIN (GLUCOPHAGE) 1000 MG tablet Take 1  tablet (1,000 mg total) by mouth 2 (two) times daily with a meal. HOLD for 48 hours, restart on 08/25/2014. 180 tablet 3  . Multiple Vitamin (MULTIVITAMIN) tablet Take 1 tablet by mouth daily.    . nitroGLYCERIN (NITROSTAT) 0.4 MG SL tablet Place 1 tablet (0.4 mg total) under the tongue every 5 (five) minutes as needed for chest pain. 90 tablet 3  . quinapril (ACCUPRIL) 40 MG tablet TAKE 1 BY MOUTH AT BEDTIME 90 tablet 0  . RELION MINI PEN NEEDLES 31G X 6 MM MISC USE AS DIRECTED 100 each 0  . sulfacetamide (BLEPH-10) 10 % ophthalmic solution INSTILL 4 DROPS INTO THE AFFECTED EYE(S) 4 TIMES DAILY FOR 5 DAYS. 15 mL 0  . tamsulosin (FLOMAX) 0.4 MG CAPS capsule TAKE 1 BY MOUTH DAILY 90 capsule 0   No current facility-administered medications for this visit.    Discontinued Meds:   There are no discontinued medications.  Patient Active Problem List   Diagnosis Date Noted  . Unstable angina 08/22/2014  . Diabetes type 2, controlled 08/08/2014  . Chest pain on exertion 07/23/2014  . Rotator cuff syndrome of right shoulder 12/16/2011  .  Shoulder pain, right 12/16/2011  . HYPERKALEMIA 01/31/2009  . KNEE PAIN, LEFT 07/31/2008  . DIABETIC FOOT ULCER, TOE 12/23/2007  . BENIGN PROSTATIC HYPERTROPHY 01/26/2007  . URINARY TRACT INFECTION 01/12/2007  . URINARY RETENTION 01/12/2007  . ERECTILE DYSFUNCTION 11/01/2006  . Hyperlipidemia 08/04/2006  . Essential hypertension 08/04/2006  . OSTEOARTHRITIS 08/04/2006    LABS    Component Value Date/Time   NA 137 08/23/2014 0525   NA 137 08/13/2014 0730   NA 140 04/23/2014 0704   K 3.9 08/23/2014 0525   K 4.5 08/13/2014 0730   K 4.9 04/23/2014 0704   CL 104 08/23/2014 0525   CL 105 08/13/2014 0730   CL 105 04/23/2014 0704   CO2 25 08/23/2014 0525   CO2 27 08/13/2014 0730   CO2 25 04/23/2014 0704   GLUCOSE 172* 08/23/2014 0525   GLUCOSE 193* 08/13/2014 0730   GLUCOSE 132* 04/23/2014 0704   BUN 14 08/23/2014 0525   BUN 27* 08/13/2014 0730    BUN 20 04/23/2014 0704   CREATININE 1.04 08/23/2014 0525   CREATININE 1.01 08/13/2014 0730   CREATININE 0.93 04/23/2014 0704   CREATININE 0.89 10/26/2013 0712   CREATININE 1.01 12/21/2012 0720   CREATININE 1.14 01/30/2009 2332   CALCIUM 9.5 08/23/2014 0525   CALCIUM 9.2 08/13/2014 0730   CALCIUM 9.3 04/23/2014 0704   GFRNONAA 67* 08/23/2014 0525   GFRNONAA 69* 08/13/2014 0730   GFRNONAA >60 03/30/2008 1459   GFRAA 77* 08/23/2014 0525   GFRAA 80* 08/13/2014 0730   GFRAA  03/30/2008 1459    >60        The eGFR has been calculated using the MDRD equation. This calculation has not been validated in all clinical   CMP     Component Value Date/Time   NA 137 08/23/2014 0525   K 3.9 08/23/2014 0525   CL 104 08/23/2014 0525   CO2 25 08/23/2014 0525   GLUCOSE 172* 08/23/2014 0525   BUN 14 08/23/2014 0525   CREATININE 1.04 08/23/2014 0525   CREATININE 0.93 04/23/2014 0704   CALCIUM 9.5 08/23/2014 0525   PROT 6.4 04/23/2014 0704   ALBUMIN 4.0 04/23/2014 0704   AST 18 04/23/2014 0704   ALT 13 04/23/2014 0704   ALKPHOS 63 04/23/2014 0704   BILITOT 0.5 04/23/2014 0704   GFRNONAA 67* 08/23/2014 0525   GFRAA 77* 08/23/2014 0525       Component Value Date/Time   WBC 5.3 08/23/2014 0525   WBC 5.5 08/13/2014 0730   WBC 5.9 01/30/2009 2332   HGB 13.2 08/23/2014 0525   HGB 11.1* 08/13/2014 0730   HGB 13.0 01/30/2009 2332   HCT 38.8* 08/23/2014 0525   HCT 33.0* 08/13/2014 0730   HCT 37.7* 01/30/2009 2332   MCV 88.4 08/23/2014 0525   MCV 87.8 08/13/2014 0730   MCV 88.5 01/30/2009 2332    Lipid Panel     Component Value Date/Time   CHOL 155 07/31/2014 0703   TRIG 95 07/31/2014 0703   HDL 53 07/31/2014 0703   CHOLHDL 2.9 07/31/2014 0703   VLDL 19 07/31/2014 0703   LDLCALC 83 07/31/2014 0703    ABG No results found for: PHART, PCO2ART, PO2ART, HCO3, TCO2, ACIDBASEDEF, O2SAT   No results found for: TSH BNP (last 3 results) No results for input(s): BNP in the last 8760  hours.  ProBNP (last 3 results) No results for input(s): PROBNP in the last 8760 hours.  Cardiac Panel (last 3 results) No results for input(s): CKTOTAL, CKMB, TROPONINI,  RELINDX in the last 72 hours.  Iron/TIBC/Ferritin/ %Sat No results found for: IRON, TIBC, FERRITIN, IRONPCTSAT   EKG Orders placed or performed during the hospital encounter of 08/22/14  . EKG 12-Lead immediately post procedure  . EKG 12-Lead  . EKG 12-Lead  . EKG 12-Lead  . EKG 12-Lead immediately post procedure  . EKG 12-Lead  . EKG 12-Lead  . EKG 12-Lead     Prior Assessment and Plan Problem List as of 09/03/2014      Cardiovascular and Mediastinum   Essential hypertension   Last Assessment & Plan 08/09/2014 Office Visit Written 08/09/2014  2:23 PM by Lendon Colonel, NP    Currently well-controlled.  Is medically compliant.  No changes in medication regimen.  Await cardiac catheterization results for any adjustments, which may be necessary.      Unstable angina     Endocrine   DIABETIC FOOT ULCER, TOE   Diabetes type 2, controlled     Musculoskeletal and Integument   OSTEOARTHRITIS   Rotator cuff syndrome of right shoulder     Genitourinary   URINARY TRACT INFECTION   BENIGN PROSTATIC HYPERTROPHY   URINARY RETENTION     Other   Hyperlipidemia   HYPERKALEMIA   ERECTILE DYSFUNCTION   KNEE PAIN, LEFT   Shoulder pain, right   Chest pain on exertion   Last Assessment & Plan 08/09/2014 Office Visit Written 08/09/2014  2:24 PM by Lendon Colonel, NP    As stated, he had a high-risk abnormal nuclear medicine stress test read by Dr. Harl Bowie on 1/ 7/ 2016. As a result of these findings.  I discussed with the patient and Dr. Domenic Polite, who is in agreement, but the patient will need to have a cardiac catheterization and definitive evaluation of coronary anatomy.  I discussed the risks and benefits, possibility of intervention if necessary.  He verbalizes understanding and is willing to proceed.  All  questions and concerns were answered.  His plan to have a cardiac catheterization on Monday, January 18th, 2016, with Dr. Martinique.  He is provided precardiac catheterization instructions, and all questions have been answered.          Imaging: Dg Chest 2 View  08/10/2014   CLINICAL DATA:  Preoperative exam prior cardiac catheterization; history of diabetes and hypertension and previous tobacco use  EXAM: CHEST  2 VIEW  COMPARISON:  PA and lateral chest x-ray dated March 15, 2013.  FINDINGS: The lungs are mildly hyperinflated with hemidiaphragm flattening. There is stable scarring in the upper lobes bilaterally. There is no alveolar infiltrate. There is no pleural effusion or pneumothorax. The heart is mildly enlarged. The pulmonary vascularity is not engorged. There is stable tortuosity of the descending thoracic aorta. There is degenerative disc change of the thoracic spine.  IMPRESSION: COPD with parenchymal scarring in the upper lobes. There is no active cardiopulmonary disease.   Electronically Signed   By: David  Martinique   On: 08/10/2014 08:26

## 2014-09-03 NOTE — Progress Notes (Signed)
Cardiology Office Note   Date:  09/03/2014   ID:  Jerry Burns, DOB [birth year], MRN [id number]  PCP:  Sallee Lange, MD  Cardiologist:  Woodroe Chen, NP   Chief Complaint  Patient presents with  . Coronary Artery Disease    Status post drug-eluting stent to the mid LAD  . Hypertension      History of Present Illness: Jerry Burns is a 81 y.o. male who presents for post hospitalization followup after admission for angina, with abnormal stress test, and referral for cardiac catheterization.  Patient had cardiac catheterization on 08/22/2014.  This revealed double vessel CAD, severe stenosis of the mid LAD, with successful PTCA and drug-eluting stent to the mid LAD, there was moderate residual disease proximal and mid LAD and posterior lateral branch of the RCA.  He was to continue aspirin and Plavix for one year, continue beta blocker, and statin.   He today without any complaints.  He brings with him a list of his blood pressures, which have been very labile.  The patient is very active and refuses to "take it easy, any longer than a week."  He climbs stairs carries heavy objects were some projects with the sons and also runs a farm.  He is without recurrent chest pain.  His breathing status has improved.  He refuses cardiac rehabilitation as he feels he is beyond that at this time concerning his activity level.  He is very grateful to the nurses and to Dr. Marisue Brooklyn.  He [place] for his care. He is medically compliant.    Past Medical History  Diagnosis Date  . Hyperlipidemia   . Stroke   . Arthritis     Bilateral knee  . Hypertension   . Coronary artery disease   . Anginal pain   . Diabetes mellitus     insulin dependent     Past Surgical History  Procedure Laterality Date  . Tonsillectomy    . Appendectomy    . Shoulder surgery    . Colonoscopy    . Coronary angioplasty with stent placement  08/22/2014    PTCA/DES X MID LAD    by Dr  Julianne Handler  . Foot surgery Right   . Left heart catheterization with coronary angiogram N/A 08/22/2014    Procedure: LEFT HEART CATHETERIZATION WITH CORONARY ANGIOGRAM;  Surgeon: Burnell Blanks, MD;  Location: [facility] CATH LAB;  Service: Cardiovascular;  Laterality: N/A;  . Percutaneous coronary stent intervention (pci-s)  08/22/2014    Procedure: PERCUTANEOUS CORONARY STENT INTERVENTION (PCI-S);  Surgeon: Burnell Blanks, MD;      Current Outpatient Prescriptions  Medication Sig Dispense Refill  . amLODipine (NORVASC) 5 MG tablet TAKE 1 BY MOUTH DAILY 90 tablet 3  . aspirin EC 81 MG tablet Take 81 mg by mouth daily.    Marland Kitchen atorvastatin (LIPITOR) 40 MG tablet TAKE 1 BY MOUTH DAILY 90 tablet 0  . brimonidine-timolol (COMBIGAN) 0.2-0.5 % ophthalmic solution Place 1 drop into the left eye 2 (two) times daily.    . carvedilol (COREG) 6.25 MG tablet TAKE (1) TABLET BY MOUTH TWICE DAILY. 60 tablet 3  . clopidogrel (PLAVIX) 75 MG tablet Take 1 tablet (75 mg total) by mouth daily with breakfast. 30 tablet 11  . dutasteride (AVODART) 0.5 MG capsule TAKE 1 BY MOUTH DAILY 90 capsule 2  . glipiZIDE (GLUCOTROL) 5 MG tablet Take 0.5 tablets (2.5 mg total) by mouth 2 (two) times daily before a meal. 30 tablet 2  .  glucose blood (BAYER CONTOUR TEST) test strip USE TO TEST TWICE DAILY 100 each 1  . isosorbide mononitrate (IMDUR) 30 MG 24 hr tablet Take 1 tablet (30 mg total) by mouth daily. 90 tablet 0  . LANTUS SOLOSTAR 100 UNIT/ML Solostar Pen INJECT 31 TO 34 UNITS TOTAL INTO THE SKIN AT BEDTIME 30 mL 0  . metFORMIN (GLUCOPHAGE) 1000 MG tablet Take 1 tablet (1,000 mg total) by mouth 2 (two) times daily with a meal. HOLD for 48 hours, restart on 08/25/2014. 180 tablet 3  . Multiple Vitamin (MULTIVITAMIN) tablet Take 1 tablet by mouth daily.    . nitroGLYCERIN (NITROSTAT) 0.4 MG SL tablet Place 1 tablet (0.4 mg total) under the tongue every 5 (five) minutes as needed for chest pain. 90 tablet 3  .  quinapril (ACCUPRIL) 40 MG tablet TAKE 1 BY MOUTH AT BEDTIME 90 tablet 0  . RELION MINI PEN NEEDLES 31G X 6 MM MISC USE AS DIRECTED 100 each 0  . sulfacetamide (BLEPH-10) 10 % ophthalmic solution INSTILL 4 DROPS INTO THE AFFECTED EYE(S) 4 TIMES DAILY FOR 5 DAYS. 15 mL 0  . tamsulosin (FLOMAX) 0.4 MG CAPS capsule TAKE 1 BY MOUTH DAILY 90 capsule 0   No current facility-administered medications for this visit.    Allergies:   Latex    Social History:  The patient  reports that he quit smoking about 7 years ago. His smoking use included Cigarettes and Cigars. He started smoking about 59 years ago. He has a 20 pack-year smoking history. He has never used smokeless tobacco. He reports that he drinks alcohol. He reports that he does not use illicit drugs.   Family History:  The patient's family history includes Diabetes in his brother and another family member.    ROS:  Please see the history of present illness.   Otherwise,  All other systems are reviewed and negative.    PHYSICAL EXAM: VS:  BP 146/70 mmHg  Pulse 61  Ht 6' (1.829 m)  Wt 223 lb (101.152 kg)  BMI 30.24 kg/m2  SpO2 95% , BMI Body mass index is 30.24 kg/(m^2). GEN: Well nourished, well developed, in no acute distress HEENT: normal Neck: no JVD, carotid bruits, or masses Cardiac: RRR; no murmurs, rubs, or gallops,no edema  Respiratory:  clear to auscultation bilaterally, normal work of breathing GI: soft, nontender, nondistended, + BS MS: no deformity or atrophy Skin: warm and dry, no rash Neuro:  Strength and sensation are intact Psych: euthymic mood, full affect    Recent Labs: 04/23/2014: ALT 13 08/23/2014: BUN 14; Creatinine 1.04; Hemoglobin 13.2; Platelets 184; Potassium 3.9; Sodium 137    Lipid Panel    Component Value Date/Time   CHOL 155 07/31/2014 0703   TRIG 95 07/31/2014 0703   HDL 53 07/31/2014 0703   CHOLHDL 2.9 07/31/2014 0703   VLDL 19 07/31/2014 0703   LDLCALC 83 07/31/2014 0703      Wt  Readings from Last 3 Encounters:  09/03/14 223 lb (101.152 kg)  08/23/14 204 lb 9.4 oz (92.8 kg)  08/13/14 220 lb (99.791 kg)      Other studies Reviewed: Additional studies/ records that were reviewed today include:cardiac catheterization and stent placement. Review of the above records demonstrates: 80%, mid LAD stenosis, with successful PTCA and stenting.   ASSESSMENT AND PLAN: 1. CAD: Status post cardiac catheterization completed after abnormal stress Myoview.  Drug-eluting stent to the mid LAD after 80% stenosis found.  Cath.  He will continue on current  medication regimen to include carvedilol, amlodipine, dual antiplatelet therapy, and statin.  I asked him not to go outside and whether less than 40, lift heavy objects for the next 3 months to allow him to get his stamina back.  He is bucking at this stating that he is already doing heavy lifting without any issues.  I have asked him to be more careful to allow his blood pressure to be better controlled without over taxing himself.  He has 2 sons that can be of help to him.  I have asked him to use them for the heavy lifting.  He verbalizes understanding.  2. Hypertension: Currently well-controlled.  We will not make any changes at this time.   Current medicines are reviewed at length with the patient today.  The patient does not have  concerns regarding medicines.  The following changes have been made:no changes made. Labs/ tests ordered today include:none. No orders of the defined types were placed in this encounter.     Disposition:   FU with 3 months   Signed, Jory Sims, NP  09/03/2014 4:26 PM    [facility] [place], [place], [place]  [address] Phone: [phone]; Fax: [phone]

## 2014-09-03 NOTE — Patient Instructions (Signed)
Your physician recommends that you schedule a follow-up appointment in: 3 months with Kathryn Lawrence, NP   Your physician recommends that you continue on your current medications as directed. Please refer to the Current Medication list given to you today.  Thank you for choosing [facility]!     

## 2014-09-03 NOTE — Telephone Encounter (Signed)
Pt came by wanting to have his bayer contour test strips sent to  [facility] in [place] instead of [place]. Pt is needing them  Refilled he is out.

## 2014-09-03 NOTE — Telephone Encounter (Signed)
Rx sent electronically to pharmacy. Patient notified. 

## 2014-09-04 ENCOUNTER — Encounter: Payer: Self-pay | Admitting: *Deleted

## 2014-09-04 ENCOUNTER — Other Ambulatory Visit: Payer: Self-pay | Admitting: *Deleted

## 2014-09-04 DIAGNOSIS — H4011X1 Primary open-angle glaucoma, mild stage: Secondary | ICD-10-CM | POA: Diagnosis not present

## 2014-09-04 MED ORDER — GLUCOSE BLOOD VI STRP: ORAL_STRIP | Status: DC

## 2014-09-24 ENCOUNTER — Other Ambulatory Visit: Payer: Self-pay | Admitting: *Deleted

## 2014-09-24 ENCOUNTER — Telehealth: Payer: Self-pay | Admitting: Family Medicine

## 2014-09-24 DIAGNOSIS — I10 Essential (primary) hypertension: Secondary | ICD-10-CM

## 2014-09-24 MED ORDER — CARVEDILOL 6.25 MG PO TABS: ORAL_TABLET | ORAL | Status: DC

## 2014-09-24 NOTE — Telephone Encounter (Signed)
Pt would like carvedilol called in as a 90 RX to [facility]

## 2014-09-24 NOTE — Telephone Encounter (Signed)
Pt dropped off readings see red folder

## 2014-09-24 NOTE — Telephone Encounter (Signed)
escribed to [facility]

## 2014-09-26 NOTE — Telephone Encounter (Signed)
Please call the patient. I reviewed over the readings that he brought in. I would recommend several things. #1 in regards to do medication half a tablet twice daily which is 2.5 mg of glipizide twice daily I would recommend that he take the morning dose but do not take a supper dose. I would continue to monitor the sugars twice daily as he has been. I would also recommend checking a metabolic 7 nonfasting as an outpatient some time within the next 10 days. I would also recommend that we set the patient for follow-up in the office within the next 7-14 days for discussion of how his sugars are doing. You can let the patient know that if he continues to have low blood sugars below 90 in the morning time to notify us.

## 2014-09-27 MED ORDER — GLIPIZIDE 5 MG PO TABS: 2.5000 mg | ORAL_TABLET | Freq: Every day | ORAL | Status: DC

## 2014-09-27 NOTE — Telephone Encounter (Signed)
[place] 3/3 (BW order is in and change in med done.)

## 2014-09-27 NOTE — Telephone Encounter (Signed)
Pt notified and verbalized understanding to go to [place] changed in EPIC. Pt verbalized understanding of how to take Glipizide and will get BW done. Transferred up front to schedule OV in the next 7-14 days per Dr. Nicki Reaper.

## 2014-09-28 DIAGNOSIS — I10 Essential (primary) hypertension: Secondary | ICD-10-CM | POA: Diagnosis not present

## 2014-09-29 LAB — BASIC METABOLIC PANEL
BUN/Creatinine Ratio: 25 — ABNORMAL HIGH (ref 10–22)
BUN: 25 mg/dL (ref 8–27)
CO2: 22 mmol/L (ref 18–29)
CREATININE: 0.99 mg/dL (ref 0.76–1.27)
Calcium: 9.6 mg/dL (ref 8.6–10.2)
Chloride: 99 mmol/L (ref 97–108)
GFR calc Af Amer: 83 mL/min/{1.73_m2} (ref >59)
GFR, EST NON AFRICAN AMERICAN: 72 mL/min/{1.73_m2} (ref >59)
GLUCOSE: 167 mg/dL — AB (ref 65–99)
Potassium: 5.3 mmol/L — ABNORMAL HIGH (ref 3.5–5.2)
Sodium: 139 mmol/L (ref 134–144)

## 2014-10-03 ENCOUNTER — Ambulatory Visit (INDEPENDENT_AMBULATORY_CARE_PROVIDER_SITE_OTHER): Payer: Medicare Other | Admitting: Family Medicine

## 2014-10-03 ENCOUNTER — Encounter: Payer: Self-pay | Admitting: Family Medicine

## 2014-10-03 VITALS — BP 118/80 | Ht 72.0 in | Wt 220.0 lb

## 2014-10-03 DIAGNOSIS — I2510 Atherosclerotic heart disease of native coronary artery without angina pectoris: Secondary | ICD-10-CM

## 2014-10-03 DIAGNOSIS — E119 Type 2 diabetes mellitus without complications: Secondary | ICD-10-CM | POA: Diagnosis not present

## 2014-10-03 NOTE — Progress Notes (Signed)
   Subjective:    Patient ID: Jerry Burns, male    DOB: 1934-04-05, 81 y.o.   MRN: [id number]  Diabetes He presents for his follow-up diabetic visit. He has type 2 diabetes mellitus. Hypoglycemia symptoms include nervousness/anxiousness. Pertinent negatives for hypoglycemia include no confusion. Pertinent negatives for diabetes include no chest pain, no fatigue, no polydipsia, no polyphagia and no weakness. (Irritated) Current diabetic treatment includes oral agent (dual therapy) and insulin injections. His highest blood glucose is >200 mg/dl. His overall blood glucose range is 130-140 mg/dl. Eye exam is current.     Heart stent in January  Left hip pain   Review of Systems  Constitutional: Negative for activity change, appetite change and fatigue.  HENT: Negative for congestion.   Respiratory: Negative for cough.   Cardiovascular: Negative for chest pain.  Gastrointestinal: Negative for abdominal pain.  Endocrine: Negative for polydipsia and polyphagia.  Neurological: Negative for weakness.  Psychiatric/Behavioral: Negative for confusion. The patient is nervous/anxious.    Currently taking a half a tablet in the morning no longer having hypoglycemic spells he gets up very early in the morning eats breakfast around 5 AM eats a snack at 8 AM eats lunch at 11:30 eats supper around 4:30 typically does not have any meal.    Objective:   Physical Exam  Constitutional: He appears well-nourished. No distress.  Cardiovascular: Normal rate, regular rhythm and normal heart sounds.   No murmur heard. Pulmonary/Chest: Effort normal and breath sounds normal. No respiratory distress.  Musculoskeletal: He exhibits no edema.  Lymphadenopathy:    He has no cervical adenopathy.  Neurological: He is alert.  Psychiatric: His behavior is normal.  Vitals reviewed.         Assessment & Plan:  Diabetes with occasional hypoglycemia we adjusted medications is active doing much better than what  it was. He will follow his readings he will send those to [place] in several weeks time. He has an appointment in April and we will discuss how he is doing again at that point. 20 minutes spent with patient greater than half in discussion of his diabetes and the care.

## 2014-10-05 ENCOUNTER — Other Ambulatory Visit: Payer: Self-pay | Admitting: Family Medicine

## 2014-10-08 ENCOUNTER — Other Ambulatory Visit: Payer: Self-pay | Admitting: Family Medicine

## 2014-10-15 ENCOUNTER — Other Ambulatory Visit: Payer: Self-pay | Admitting: Family Medicine

## 2014-10-18 ENCOUNTER — Encounter: Payer: Self-pay | Admitting: Internal Medicine

## 2014-10-22 ENCOUNTER — Other Ambulatory Visit: Payer: Self-pay | Admitting: Family Medicine

## 2014-10-25 ENCOUNTER — Telehealth: Payer: Self-pay | Admitting: Family Medicine

## 2014-10-25 DIAGNOSIS — E785 Hyperlipidemia, unspecified: Secondary | ICD-10-CM

## 2014-10-25 DIAGNOSIS — E119 Type 2 diabetes mellitus without complications: Secondary | ICD-10-CM

## 2014-10-25 DIAGNOSIS — Z79899 Other long term (current) drug therapy: Secondary | ICD-10-CM

## 2014-10-25 DIAGNOSIS — I10 Essential (primary) hypertension: Secondary | ICD-10-CM

## 2014-10-25 NOTE — Telephone Encounter (Signed)
Pt needs bw orders is aware to got to [facility]   Call when ready   Last labs 1-15, 27, 28 (hospital) With [place] 09-28-14 BMP

## 2014-10-25 NOTE — Telephone Encounter (Signed)
Lipid/liver/met 7/ A1C/urine micro protein

## 2014-10-26 NOTE — Telephone Encounter (Signed)
Pt notified and verbalized understanding to go to [facility] and be fasting. 

## 2014-11-02 DIAGNOSIS — E785 Hyperlipidemia, unspecified: Secondary | ICD-10-CM | POA: Diagnosis not present

## 2014-11-02 DIAGNOSIS — Z79899 Other long term (current) drug therapy: Secondary | ICD-10-CM | POA: Diagnosis not present

## 2014-11-02 DIAGNOSIS — I10 Essential (primary) hypertension: Secondary | ICD-10-CM | POA: Diagnosis not present

## 2014-11-02 DIAGNOSIS — E119 Type 2 diabetes mellitus without complications: Secondary | ICD-10-CM | POA: Diagnosis not present

## 2014-11-03 LAB — BASIC METABOLIC PANEL
BUN/Creatinine Ratio: 30 — ABNORMAL HIGH (ref 10–22)
BUN: 28 mg/dL — AB (ref 8–27)
CALCIUM: 9.4 mg/dL (ref 8.6–10.2)
CHLORIDE: 101 mmol/L (ref 97–108)
CO2: 23 mmol/L (ref 18–29)
Creatinine, Ser: 0.94 mg/dL (ref 0.76–1.27)
GFR calc Af Amer: 89 mL/min/{1.73_m2} (ref >59)
GFR calc non Af Amer: 77 mL/min/{1.73_m2} (ref >59)
Glucose: 198 mg/dL — ABNORMAL HIGH (ref 65–99)
Potassium: 4.9 mmol/L (ref 3.5–5.2)
SODIUM: 138 mmol/L (ref 134–144)

## 2014-11-03 LAB — HEMOGLOBIN A1C
Est. average glucose Bld gHb Est-mCnc: 163 mg/dL
Hgb A1c MFr Bld: 7.3 % — ABNORMAL HIGH (ref 4.8–5.6)

## 2014-11-03 LAB — HEPATIC FUNCTION PANEL
ALT: 16 IU/L (ref 0–44)
AST: 18 IU/L (ref 0–40)
Albumin: 4.4 g/dL (ref 3.5–4.8)
Alkaline Phosphatase: 88 IU/L (ref 39–117)
BILIRUBIN TOTAL: 0.4 mg/dL (ref 0.0–1.2)
BILIRUBIN, DIRECT: 0.12 mg/dL (ref 0.00–0.40)
TOTAL PROTEIN: 6.8 g/dL (ref 6.0–8.5)

## 2014-11-03 LAB — LIPID PANEL
CHOLESTEROL TOTAL: 151 mg/dL (ref 100–199)
Chol/HDL Ratio: 3.6 ratio units (ref 0.0–5.0)
HDL: 42 mg/dL (ref >39)
LDL Calculated: 86 mg/dL (ref 0–99)
Triglycerides: 113 mg/dL (ref 0–149)
VLDL CHOLESTEROL CAL: 23 mg/dL (ref 5–40)

## 2014-11-03 LAB — MICROALBUMIN, URINE: Microalbumin, Urine: 42.2 ug/mL — ABNORMAL HIGH (ref 0.0–17.0)

## 2014-11-07 ENCOUNTER — Ambulatory Visit (INDEPENDENT_AMBULATORY_CARE_PROVIDER_SITE_OTHER): Payer: Medicare Other | Admitting: Family Medicine

## 2014-11-07 VITALS — BP 126/82 | Ht 72.0 in | Wt 219.4 lb

## 2014-11-07 DIAGNOSIS — I2510 Atherosclerotic heart disease of native coronary artery without angina pectoris: Secondary | ICD-10-CM | POA: Diagnosis not present

## 2014-11-07 DIAGNOSIS — E119 Type 2 diabetes mellitus without complications: Secondary | ICD-10-CM | POA: Diagnosis not present

## 2014-11-07 DIAGNOSIS — E875 Hyperkalemia: Secondary | ICD-10-CM | POA: Diagnosis not present

## 2014-11-07 MED ORDER — ATORVASTATIN CALCIUM 80 MG PO TABS: 80.0000 mg | ORAL_TABLET | Freq: Every day | ORAL | Status: DC

## 2014-11-07 MED ORDER — MOMETASONE FUROATE 0.1 % EX CREA: TOPICAL_CREAM | CUTANEOUS | Status: DC

## 2014-11-07 NOTE — Progress Notes (Signed)
   Subjective:    Patient ID: Jerry Burns, male    DOB: [birth year], 81 y.o.   MRN: [id number]  Diabetes He presents for his follow-up diabetic visit. He has type 2 diabetes mellitus. Pertinent negatives for hypoglycemia include no confusion. Pertinent negatives for diabetes include no chest pain, no fatigue, no polydipsia, no polyphagia and no weakness. Risk factors for coronary artery disease include dyslipidemia and hypertension. Current diabetic treatment includes oral agent (dual therapy) and insulin injections. He is compliant with treatment all of the time.   Is sauna good for arthritis and rash on arm from bandaid  Review of Systems  Constitutional: Negative for activity change, appetite change and fatigue.  HENT: Negative for congestion.   Respiratory: Negative for cough.   Cardiovascular: Negative for chest pain.  Gastrointestinal: Negative for abdominal pain.  Endocrine: Negative for polydipsia and polyphagia.  Neurological: Negative for weakness.  Psychiatric/Behavioral: Negative for confusion.       Objective:   Physical Exam  Constitutional: He appears well-nourished. No distress.  Cardiovascular: Normal rate, regular rhythm and normal heart sounds.   No murmur heard. Pulmonary/Chest: Effort normal and breath sounds normal. No respiratory distress.  Musculoskeletal: He exhibits no edema.  Lymphadenopathy:    He has no cervical adenopathy.  Neurological: He is alert.  Psychiatric: His behavior is normal.  Vitals reviewed.         Assessment & Plan:  Contact dermatitis use [place] Diabetes good control continue current measures no low sugars currently follow-up in approximately 3-4 months check hemoglobin A1c at that visit Blood pressure stable comprehensive visit next visit

## 2014-11-09 ENCOUNTER — Other Ambulatory Visit: Payer: Self-pay | Admitting: *Deleted

## 2014-11-09 MED ORDER — ATORVASTATIN CALCIUM 80 MG PO TABS: 80.0000 mg | ORAL_TABLET | Freq: Every day | ORAL | Status: DC

## 2014-11-15 ENCOUNTER — Other Ambulatory Visit: Payer: Self-pay | Admitting: Family Medicine

## 2014-11-21 ENCOUNTER — Ambulatory Visit (INDEPENDENT_AMBULATORY_CARE_PROVIDER_SITE_OTHER): Payer: Medicare Other | Admitting: Nurse Practitioner

## 2014-11-21 ENCOUNTER — Encounter: Payer: Self-pay | Admitting: Nurse Practitioner

## 2014-11-21 VITALS — BP 111/59 | HR 57 | Temp 97.1°F | Ht 72.0 in | Wt 221.4 lb

## 2014-11-21 DIAGNOSIS — I2510 Atherosclerotic heart disease of native coronary artery without angina pectoris: Secondary | ICD-10-CM

## 2014-11-21 DIAGNOSIS — Z86010 Personal history of colonic polyps: Secondary | ICD-10-CM

## 2014-11-21 NOTE — Patient Instructions (Signed)
1. Keep her follow-up appointment with cardiology month. 2. We will await their input for clearance for possible colonoscopy. 3. I will discuss her situation with Dr. Gala Romney component when he is most comfortable with proceeding with a colonoscopy on Plavix. 4. Follow-up in 3 months to reevaluate.

## 2014-11-21 NOTE — Progress Notes (Signed)
CC'ED TO PCP 

## 2014-11-21 NOTE — Assessment & Plan Note (Signed)
Repeat surveillance colonoscopy. Last colonoscopy was 5 years ago with diffuse tubular adenoma polyps removed. Recommended repeat in 5 years. In that time frame however the patient has had a cardiac catheterization with a drug-eluting stent placement approximately 3 months ago and is currently on Plavix. Also admits some residual morning chest pain. He is due to be seen by cardiology in approximately 2-3 weeks. Given his current Plavix anticoagulation and potential residual chest pain with atherosclerotic coronary disease we will hold off on colonoscopy right now until he can be seen and cleared by cardiology. Additionally, per recommendations, Plavix medication should not be held for a minimum of 6 months after DES placement. I will consult with Dr. Gala Romney to find out when he is most comfortable with proceeding with a surveillance colonoscopy with possible polypectomy on Plavix, and notify the patient. Follow-up appointment tentatively scheduled for 3 months.

## 2014-11-21 NOTE — Progress Notes (Signed)
Primary Care Physician:  Sallee Lange, MD Primary Gastroenterologist:  Dr. Gala Romney  Chief Complaint  Patient presents with  . set up TCS    HPI:   81 year old male presents for office visit for a 5 year surveillance colonoscopy. Last colonoscopy performed on 11/20/2009 findings were: The prep was adequate, 2 ascending colon polyps with the largest more and 6 mm, normal rectum, left-sided diverticula. Pathology of the polyp showed both were tubular adenoma, recommended 5 year repeat exam. Next  Today he states he's doing quite well overall. Denies abdominal pain, N/V, hematochezia, and melena. Has had a stent placed 3 months ago and has occasional chest pain with exertion. Has an appointment with cardiology in early May. Denies dizziness, syncope, and near syncope. Denies any other upper or lower GI symptoms.   Past Medical History  Diagnosis Date  . Hyperlipidemia   . Stroke   . Arthritis     Bilateral knee  . Hypertension   . Coronary artery disease   . Anginal pain   . Diabetes mellitus     insulin dependent     Past Surgical History  Procedure Laterality Date  . Tonsillectomy    . Appendectomy    . Shoulder surgery    . Colonoscopy  2011    RMR: 1. Normal rectum 2. Left sided diverticula , 2 ascending colon polyps, status post snare polypectomy. Remainder of colonic mucosa appeared unremarkable.   . Coronary angioplasty with stent placement  08/22/2014    PTCA/DES X MID LAD    by Dr Julianne Handler  . Foot surgery Right   . Left heart catheterization with coronary angiogram N/A 08/22/2014    Procedure: LEFT HEART CATHETERIZATION WITH CORONARY ANGIOGRAM;  Surgeon: Burnell Blanks, MD;  Location: [facility] CATH LAB;  Service: Cardiovascular;  Laterality: N/A;  . Percutaneous coronary stent intervention (pci-s)  08/22/2014    Procedure: PERCUTANEOUS CORONARY STENT INTERVENTION (PCI-S);  Surgeon: Burnell Blanks, MD;     Current Outpatient Prescriptions  Medication Sig  Dispense Refill  . amLODipine (NORVASC) 5 MG tablet TAKE 1 BY MOUTH DAILY 90 tablet 3  . aspirin EC 81 MG tablet Take 81 mg by mouth daily.    Marland Kitchen atorvastatin (LIPITOR) 80 MG tablet Take 1 tablet (80 mg total) by mouth daily. 90 tablet 1  . brimonidine-timolol (COMBIGAN) 0.2-0.5 % ophthalmic solution Place 1 drop into the left eye 2 (two) times daily.    . carvedilol (COREG) 6.25 MG tablet TAKE (1) TABLET BY MOUTH TWICE DAILY. 180 tablet 3  . clopidogrel (PLAVIX) 75 MG tablet Take 1 tablet (75 mg total) by mouth daily with breakfast. 30 tablet 11  . dutasteride (AVODART) 0.5 MG capsule TAKE 1 BY MOUTH DAILY 90 capsule 2  . glipiZIDE (GLUCOTROL) 5 MG tablet TAKE 1/2 TABLET BY MOUTH TWO TIMES DAILY BEFORE A MEAL. 30 tablet 5  . glucose blood (BAYER CONTOUR TEST) test strip USE TO TEST TWICE DAILY 100 each 1  . isosorbide mononitrate (IMDUR) 30 MG 24 hr tablet TAKE ONE TABLET BY MOUTH ONCE DAILY. 90 tablet 3  . LANTUS SOLOSTAR 100 UNIT/ML Solostar Pen INJECT 31 TO 34 UNITS SUBCUTANEOUSLY AT BEDTIME 30 mL 1  . metFORMIN (GLUCOPHAGE) 1000 MG tablet Take 1 tablet (1,000 mg total) by mouth 2 (two) times daily with a meal. HOLD for 48 hours, restart on 08/25/2014. 180 tablet 3  . mometasone (ELOCON) 0.1 % cream Apply to affected area daily 45 g 1  . Multiple Vitamin (  MULTIVITAMIN) tablet Take 1 tablet by mouth daily.    . nitroGLYCERIN (NITROSTAT) 0.4 MG SL tablet Place 1 tablet (0.4 mg total) under the tongue every 5 (five) minutes as needed for chest pain. 90 tablet 3  . quinapril (ACCUPRIL) 40 MG tablet TAKE 1 BY MOUTH AT BEDTIME 90 tablet 1  . RELION MINI PEN NEEDLES 31G X 6 MM MISC USE AS DIRECTED 100 each 0  . sulfacetamide (BLEPH-10) 10 % ophthalmic solution INSTILL 4 DROPS INTO THE AFFECTED EYE(S) 4 TIMES DAILY FOR 5 DAYS. 15 mL 0  . tamsulosin (FLOMAX) 0.4 MG CAPS capsule TAKE 1 BY MOUTH DAILY 90 capsule 1   No current facility-administered medications for this visit.    Allergies as of  11/21/2014 - Review Complete 11/21/2014  Allergen Reaction Noted  . Latex Other (See Comments) 06/29/2012    Family History  Problem Relation Age of Onset  . Diabetes    . Diabetes Brother     History   Social History  . Marital Status: Married    Spouse Name: N/A  . Number of Children: N/A  . Years of Education: 14   Occupational History  . retired    Social History Main Topics  . Smoking status: Former Smoker -- 1.00 packs/day for 20 years    Types: Cigarettes, Cigars    Start date: 08/10/1955    Quit date: 07/28/2007  . Smokeless tobacco: Never Used  . Alcohol Use: 0.0 oz/week    0 Standard drinks or equivalent per week  . Drug Use: No  . Sexual Activity: Not on file   Other Topics Concern  . Not on file   Social History Narrative    Review of Systems: General: Negative for anorexia, weight loss, fever, chills, fatigue, weakness. Eyes: Negative for vision changes.  ENT: Negative for hoarseness, difficulty swallowing. CV: Admits occasional morning chest pain. Denies palpitations, dyspnea on exertion, peripheral edema.  Respiratory: Negative for dyspnea at rest, cough, wheezing.  GI: See history of present illness. MS: Negative for joint pain, low back pain.  Derm: Negative for rash or itching.  Neuro: Negative for weakness, seizure, frequent headaches, memory loss, confusion.  Psych: Negative for anxiety, depression.  Endo: Negative for unusual weight change.  Heme: Negative for bruising or bleeding. Allergy: Negative for rash or hives.    Physical Exam: BP 111/59 mmHg  Pulse 57  Temp(Src) 97.1 F (36.2 C)  Ht 6' (1.829 m)  Wt 221 lb 6.4 oz (100.426 kg)  BMI 30.02 kg/m2 General:   Alert and oriented. Pleasant and cooperative. Well-nourished and well-developed.  Head:  Normocephalic and atraumatic. Eyes:  Without icterus, sclera clear and conjunctiva pink.  Ears:  Normal auditory acuity. Mouth:  No deformity or lesions, oral mucosa pink. No OP  edema. Neck:  Supple, without mass or thyromegaly. Lungs:  Clear to auscultation bilaterally. No wheezes, rales, or rhonchi. No distress.  Heart:  S1, S2 present without murmurs appreciated.  Abdomen:  +BS, soft, non-tender and non-distended. No HSM noted. No guarding or rebound. No masses appreciated.  Rectal:  Deferred  Msk:  Symmetrical without gross deformities. Normal posture. Extremities:  Without clubbing or edema. Neurologic:  Alert and  oriented x4;  grossly normal neurologically. Skin:  Intact without significant lesions or rashes. Cervical Nodes:  No significant cervical adenopathy. Psych:  Alert and cooperative. Normal mood and affect.     11/21/2014 9:32 AM

## 2014-12-04 ENCOUNTER — Encounter: Payer: Self-pay | Admitting: Adult Health

## 2014-12-04 ENCOUNTER — Ambulatory Visit (INDEPENDENT_AMBULATORY_CARE_PROVIDER_SITE_OTHER): Payer: Medicare Other | Admitting: Adult Health

## 2014-12-04 VITALS — BP 158/66 | HR 57 | Ht 72.0 in | Wt 222.0 lb

## 2014-12-04 DIAGNOSIS — I2510 Atherosclerotic heart disease of native coronary artery without angina pectoris: Secondary | ICD-10-CM | POA: Diagnosis not present

## 2014-12-04 DIAGNOSIS — I10 Essential (primary) hypertension: Secondary | ICD-10-CM

## 2014-12-04 MED ORDER — ATORVASTATIN CALCIUM 80 MG PO TABS: 80.0000 mg | ORAL_TABLET | Freq: Every day | ORAL | Status: DC

## 2014-12-04 MED ORDER — AMLODIPINE BESYLATE 5 MG PO TABS: ORAL_TABLET | ORAL | Status: DC

## 2014-12-04 MED ORDER — ISOSORBIDE MONONITRATE ER 30 MG PO TB24: 30.0000 mg | ORAL_TABLET | Freq: Every day | ORAL | Status: DC

## 2014-12-04 MED ORDER — CARVEDILOL 6.25 MG PO TABS: ORAL_TABLET | ORAL | Status: DC

## 2014-12-04 MED ORDER — CLOPIDOGREL BISULFATE 75 MG PO TABS: 75.0000 mg | ORAL_TABLET | Freq: Every day | ORAL | Status: DC

## 2014-12-04 MED ORDER — QUINAPRIL HCL 40 MG PO TABS: ORAL_TABLET | ORAL | Status: DC

## 2014-12-04 NOTE — Progress Notes (Signed)
Cardiology Office Note   Date:  12/04/2014   ID:  Jerry Burns, DOB [birth year], MRN [id number]  PCP:  Sallee Lange, MD  Cardiologist:  Woodroe Chen, NP   Chief Complaint  Patient presents with  . Coronary Artery Disease    DES to mid LAD  . Hypertension      History of Present Illness: Jerry Burns is a 81 y.o. male who presents for ongoing assessment and management of CAD, status post drug-eluting stent to the mid LAD, January 2016, hypertension, who was last seen in the office in February 2016.  At that time, he was doing well without anycardiac complaints.  His blood pressure was well controlled.  He is being seen for 3 month followup.   He is doing well from a cardiac standpoint without any recurrent chest pain.  He states that he walks up his driveway to get his mail and the driveway.  He is on any incline by the time.  He gets to the mailbox and he occasionally feels some chest pressure and shortness of breath when he gets there it goes away quicklyonce he stops.  He has multiple questions about his stents along with questions about any upcoming colonoscopy.  He states that he saw Walden Field, nurse practitioner, of GI practice, who would like to plan a colonoscopy as he has not had one in 5 years. He is not having any complaints of active bleeding, melena, hemoptysis, or epistaxis.  He does have occasional bruising.    Past Medical History  Diagnosis Date  . Hyperlipidemia   . Stroke   . Arthritis     Bilateral knee  . Hypertension   . Coronary artery disease   . Anginal pain   . Diabetes mellitus     insulin dependent     Past Surgical History  Procedure Laterality Date  . Tonsillectomy    . Appendectomy    . Shoulder surgery    . Colonoscopy  2011    RMR: 1. Normal rectum 2. Left sided diverticula , 2 ascending colon polyps, status post snare polypectomy. Remainder of colonic mucosa appeared unremarkable.   . Coronary angioplasty with stent  placement  08/22/2014    PTCA/DES X MID LAD    by Dr Julianne Handler  . Foot surgery Right   . Left heart catheterization with coronary angiogram N/A 08/22/2014    Procedure: LEFT HEART CATHETERIZATION WITH CORONARY ANGIOGRAM;  Surgeon: Burnell Blanks, MD;  Location: [facility] CATH LAB;  Service: Cardiovascular;  Laterality: N/A;  . Percutaneous coronary stent intervention (pci-s)  08/22/2014    Procedure: PERCUTANEOUS CORONARY STENT INTERVENTION (PCI-S);  Surgeon: Burnell Blanks, MD;      Current Outpatient Prescriptions  Medication Sig Dispense Refill  . amLODipine (NORVASC) 5 MG tablet TAKE 1 BY MOUTH DAILY 90 tablet 3  . aspirin EC 81 MG tablet Take 81 mg by mouth daily.    Marland Kitchen atorvastatin (LIPITOR) 80 MG tablet Take 1 tablet (80 mg total) by mouth daily. 90 tablet 3  . brimonidine-timolol (COMBIGAN) 0.2-0.5 % ophthalmic solution Place 1 drop into the left eye 2 (two) times daily.    . carvedilol (COREG) 6.25 MG tablet TAKE (1) TABLET BY MOUTH TWICE DAILY. 180 tablet 3  . clopidogrel (PLAVIX) 75 MG tablet Take 1 tablet (75 mg total) by mouth daily with breakfast. 30 tablet 11  . isosorbide mononitrate (IMDUR) 30 MG 24 hr tablet Take 1 tablet (30 mg total) by mouth daily. 90 tablet  3  . nitroGLYCERIN (NITROSTAT) 0.4 MG SL tablet Place 1 tablet (0.4 mg total) under the tongue every 5 (five) minutes as needed for chest pain. 90 tablet 3  . tamsulosin (FLOMAX) 0.4 MG CAPS capsule TAKE 1 BY MOUTH DAILY 90 capsule 1  . dutasteride (AVODART) 0.5 MG capsule TAKE 1 BY MOUTH DAILY 90 capsule 2  . glipiZIDE (GLUCOTROL) 5 MG tablet TAKE 1/2 TABLET BY MOUTH TWO TIMES DAILY BEFORE A MEAL. 30 tablet 5  . glucose blood (BAYER CONTOUR TEST) test strip USE TO TEST TWICE DAILY 100 each 1  . LANTUS SOLOSTAR 100 UNIT/ML Solostar Pen INJECT 31 TO 34 UNITS SUBCUTANEOUSLY AT BEDTIME 30 mL 1  . metFORMIN (GLUCOPHAGE) 1000 MG tablet Take 1 tablet (1,000 mg total) by mouth 2 (two) times daily with a meal. HOLD for  48 hours, restart on 08/25/2014. 180 tablet 3  . mometasone (ELOCON) 0.1 % cream Apply to affected area daily 45 g 1  . Multiple Vitamin (MULTIVITAMIN) tablet Take 1 tablet by mouth daily.    . quinapril (ACCUPRIL) 40 MG tablet TAKE 1 BY MOUTH AT BEDTIME 90 tablet 3  . RELION MINI PEN NEEDLES 31G X 6 MM MISC USE AS DIRECTED 100 each 0  . sulfacetamide (BLEPH-10) 10 % ophthalmic solution INSTILL 4 DROPS INTO THE AFFECTED EYE(S) 4 TIMES DAILY FOR 5 DAYS. 15 mL 0   No current facility-administered medications for this visit.    Allergies:   Latex    Social History:  The patient  reports that he quit smoking about 7 years ago. His smoking use included Cigarettes and Cigars. He started smoking about 59 years ago. He has a 20 pack-year smoking history. He has never used smokeless tobacco. He reports that he drinks alcohol. He reports that he does not use illicit drugs.   Family History:  The patient's family history includes Colon cancer in his paternal uncle; Diabetes in his brother and another family member.    ROS: .   All other systems are reviewed and negative.Unless otherwise mentioned in H&P above.   PHYSICAL EXAM: VS:  BP 158/66 mmHg  Pulse 57  Ht 6' (1.829 m)  Wt 222 lb (100.699 kg)  BMI 30.10 kg/m2  SpO2 96% , BMI Body mass index is 30.1 kg/(m^2). GEN: Well nourished, well developed, in no acute distress HEENT: normal Neck: no JVD, carotid bruits, or masses Cardiac: RRR; no murmurs, rubs, or gallops,no edema  Respiratory:  Clear to auscultation bilaterally, normal work of breathing GI: soft, nontender, nondistended, + BS MS: no deformity or atrophy Skin: warm and dry, no rash Neuro:  Strength and sensation are intact Psych: euthymic mood, full affect   Recent Labs: 08/23/2014: Hemoglobin 13.2; Platelets 184 11/02/2014: ALT 16; BUN 28*; Creatinine 0.94; Potassium 4.9; Sodium 138    Lipid Panel    Component Value Date/Time   CHOL 151 11/02/2014 0806   CHOL 155  07/31/2014 0703   TRIG 113 11/02/2014 0806   HDL 42 11/02/2014 0806   HDL 53 07/31/2014 0703   CHOLHDL 3.6 11/02/2014 0806   CHOLHDL 2.9 07/31/2014 0703   VLDL 19 07/31/2014 0703   LDLCALC 86 11/02/2014 0806   LDLCALC 83 07/31/2014 0703      Wt Readings from Last 3 Encounters:  12/04/14 222 lb (100.699 kg)  11/21/14 221 lb 6.4 oz (100.426 kg)  11/07/14 219 lb 6.4 oz (99.519 kg)      Other studies Reviewed: Additional studies/ records that were reviewed today  include: None Review of the above records demonstrates: N/A   ASSESSMENT AND PLAN:  1. CAD: Status post PTCA with a drug-eluting stent of the mid LAD, with moderate residual disease in the proximal and mid LAD, and posterior lateral branch of RCA.  He is to remain on dual antiplatelet therapy with aspirin and Plavix for a minimum of one year.  The patient is advised to hold off on any elective procedures until January of 2017.  This will include colonoscopy unless GI would like to move forward.  If this is the case, he will need to stop his Plavix for a few days, but this is not recommended.Concerning his mild dyspnea with exertion, we will continue to monitor this.  It is expected for him to be a little short of breath when climbing a hill.  He is otherwise asymptomatic  2. Hypertension: Blood pressure is moderately controlled.  He is a little anxious here in the office.  Normally, his blood pressure is lower at home. Reveals a been provided on amlodipine, carvedilol, Plavix, and  Quinapril.labs are being followed by Dr.Luking.      Current medicines are reviewed at length with the patient today.    Labs/ tests ordered today include: None No orders of the defined types were placed in this encounter.     Disposition:   FU with  6 months   Signed, Jory Sims, NP  12/04/2014 2:08 PM    [facility] [address], [place], [place] [address] Phone: [phone]; Fax: [phone]

## 2014-12-04 NOTE — Progress Notes (Deleted)
Name: Jerry Burns    DOB: January 07, 1934  Age: 81 y.o.  MR#: [id number]       PCP:  Sallee Lange, MD      Insurance: Payor: MEDICARE / Plan: MEDICARE PART A AND B / Product Type: *No Product type* /   CC:    Chief Complaint  Patient presents with  . Coronary Artery Disease    DES to mid LAD  . Hypertension    VS Filed Vitals:   12/04/14 1331  BP: 158/66  Pulse: 57  Height: 6' (1.829 m)  Weight: 222 lb (100.699 kg)  SpO2: 96%    Weights Current Weight  12/04/14 222 lb (100.699 kg)  11/21/14 221 lb 6.4 oz (100.426 kg)  11/07/14 219 lb 6.4 oz (99.519 kg)    Blood Pressure  BP Readings from Last 3 Encounters:  12/04/14 158/66  11/21/14 111/59  11/07/14 126/82     Admit date:  (Not on file) Last encounter with RMR:  09/03/2014   Allergy Latex  Current Outpatient Prescriptions  Medication Sig Dispense Refill  . amLODipine (NORVASC) 5 MG tablet TAKE 1 BY MOUTH DAILY 90 tablet 3  . aspirin EC 81 MG tablet Take 81 mg by mouth daily.    Marland Kitchen atorvastatin (LIPITOR) 80 MG tablet Take 1 tablet (80 mg total) by mouth daily. 90 tablet 1  . brimonidine-timolol (COMBIGAN) 0.2-0.5 % ophthalmic solution Place 1 drop into the left eye 2 (two) times daily.    . carvedilol (COREG) 6.25 MG tablet TAKE (1) TABLET BY MOUTH TWICE DAILY. 180 tablet 3  . clopidogrel (PLAVIX) 75 MG tablet Take 1 tablet (75 mg total) by mouth daily with breakfast. 30 tablet 11  . isosorbide mononitrate (IMDUR) 30 MG 24 hr tablet TAKE ONE TABLET BY MOUTH ONCE DAILY. 90 tablet 3  . nitroGLYCERIN (NITROSTAT) 0.4 MG SL tablet Place 1 tablet (0.4 mg total) under the tongue every 5 (five) minutes as needed for chest pain. 90 tablet 3  . tamsulosin (FLOMAX) 0.4 MG CAPS capsule TAKE 1 BY MOUTH DAILY 90 capsule 1  . dutasteride (AVODART) 0.5 MG capsule TAKE 1 BY MOUTH DAILY 90 capsule 2  . glipiZIDE (GLUCOTROL) 5 MG tablet TAKE 1/2 TABLET BY MOUTH TWO TIMES DAILY BEFORE A MEAL. 30 tablet 5  . glucose blood (BAYER CONTOUR  TEST) test strip USE TO TEST TWICE DAILY 100 each 1  . LANTUS SOLOSTAR 100 UNIT/ML Solostar Pen INJECT 31 TO 34 UNITS SUBCUTANEOUSLY AT BEDTIME 30 mL 1  . metFORMIN (GLUCOPHAGE) 1000 MG tablet Take 1 tablet (1,000 mg total) by mouth 2 (two) times daily with a meal. HOLD for 48 hours, restart on 08/25/2014. 180 tablet 3  . mometasone (ELOCON) 0.1 % cream Apply to affected area daily 45 g 1  . Multiple Vitamin (MULTIVITAMIN) tablet Take 1 tablet by mouth daily.    . quinapril (ACCUPRIL) 40 MG tablet TAKE 1 BY MOUTH AT BEDTIME 90 tablet 1  . RELION MINI PEN NEEDLES 31G X 6 MM MISC USE AS DIRECTED 100 each 0  . sulfacetamide (BLEPH-10) 10 % ophthalmic solution INSTILL 4 DROPS INTO THE AFFECTED EYE(S) 4 TIMES DAILY FOR 5 DAYS. 15 mL 0   No current facility-administered medications for this visit.    Discontinued Meds:   There are no discontinued medications.  Patient Active Problem List   Diagnosis Date Noted  . History of colonic polyps 11/21/2014  . ASCVD (arteriosclerotic cardiovascular disease) 09/03/2014  . Unstable angina 08/22/2014  .  Diabetes type 2, controlled 08/08/2014  . Chest pain on exertion 07/23/2014  . Rotator cuff syndrome of right shoulder 12/16/2011  . Shoulder pain, right 12/16/2011  . HYPERKALEMIA 01/31/2009  . KNEE PAIN, LEFT 07/31/2008  . DIABETIC FOOT ULCER, TOE 12/23/2007  . BENIGN PROSTATIC HYPERTROPHY 01/26/2007  . URINARY TRACT INFECTION 01/12/2007  . URINARY RETENTION 01/12/2007  . ERECTILE DYSFUNCTION 11/01/2006  . Hyperlipidemia 08/04/2006  . Essential hypertension 08/04/2006  . OSTEOARTHRITIS 08/04/2006    LABS    Component Value Date/Time   NA 138 11/02/2014 0806   NA 139 09/28/2014 0815   NA 137 08/23/2014 0525   NA 137 08/13/2014 0730   NA 140 04/23/2014 0704   K 4.9 11/02/2014 0806   K 5.3* 09/28/2014 0815   K 3.9 08/23/2014 0525   CL 101 11/02/2014 0806   CL 99 09/28/2014 0815   CL 104 08/23/2014 0525   CO2 23 11/02/2014 0806   CO2  22 09/28/2014 0815   CO2 25 08/23/2014 0525   GLUCOSE 198* 11/02/2014 0806   GLUCOSE 167* 09/28/2014 0815   GLUCOSE 172* 08/23/2014 0525   GLUCOSE 193* 08/13/2014 0730   GLUCOSE 132* 04/23/2014 0704   BUN 28* 11/02/2014 0806   BUN 25 09/28/2014 0815   BUN 14 08/23/2014 0525   BUN 27* 08/13/2014 0730   BUN 20 04/23/2014 0704   CREATININE 0.94 11/02/2014 0806   CREATININE 0.99 09/28/2014 0815   CREATININE 1.04 08/23/2014 0525   CREATININE 0.93 04/23/2014 0704   CREATININE 0.89 10/26/2013 0712   CREATININE 1.01 12/21/2012 0720   CALCIUM 9.4 11/02/2014 0806   CALCIUM 9.6 09/28/2014 0815   CALCIUM 9.5 08/23/2014 0525   GFRNONAA 77 11/02/2014 0806   GFRNONAA 72 09/28/2014 0815   GFRNONAA 67* 08/23/2014 0525   GFRAA 89 11/02/2014 0806   GFRAA 83 09/28/2014 0815   GFRAA 77* 08/23/2014 0525   CMP     Component Value Date/Time   NA 138 11/02/2014 0806   NA 137 08/23/2014 0525   K 4.9 11/02/2014 0806   CL 101 11/02/2014 0806   CO2 23 11/02/2014 0806   GLUCOSE 198* 11/02/2014 0806   GLUCOSE 172* 08/23/2014 0525   BUN 28* 11/02/2014 0806   BUN 14 08/23/2014 0525   CREATININE 0.94 11/02/2014 0806   CREATININE 0.93 04/23/2014 0704   CALCIUM 9.4 11/02/2014 0806   PROT 6.8 11/02/2014 0806   PROT 6.4 04/23/2014 0704   ALBUMIN 4.0 04/23/2014 0704   AST 18 11/02/2014 0806   ALT 16 11/02/2014 0806   ALKPHOS 88 11/02/2014 0806   BILITOT 0.4 11/02/2014 0806   BILITOT 0.5 04/23/2014 0704   GFRNONAA 77 11/02/2014 0806   GFRAA 89 11/02/2014 0806       Component Value Date/Time   WBC 5.3 08/23/2014 0525   WBC 5.5 08/13/2014 0730   WBC 5.9 01/30/2009 2332   HGB 13.2 08/23/2014 0525   HGB 11.1* 08/13/2014 0730   HGB 13.0 01/30/2009 2332   HCT 38.8* 08/23/2014 0525   HCT 33.0* 08/13/2014 0730   HCT 37.7* 01/30/2009 2332   MCV 88.4 08/23/2014 0525   MCV 87.8 08/13/2014 0730   MCV 88.5 01/30/2009 2332    Lipid Panel     Component Value Date/Time   CHOL 151 11/02/2014 0806    CHOL 155 07/31/2014 0703   TRIG 113 11/02/2014 0806   HDL 42 11/02/2014 0806   HDL 53 07/31/2014 0703   CHOLHDL 3.6 11/02/2014 0806   CHOLHDL 2.9  07/31/2014 0703   VLDL 19 07/31/2014 0703   LDLCALC 86 11/02/2014 0806   LDLCALC 83 07/31/2014 0703    ABG No results found for: PHART, PCO2ART, PO2ART, HCO3, TCO2, ACIDBASEDEF, O2SAT   No results found for: TSH BNP (last 3 results) No results for input(s): BNP in the last 8760 hours.  ProBNP (last 3 results) No results for input(s): PROBNP in the last 8760 hours.  Cardiac Panel (last 3 results) No results for input(s): CKTOTAL, CKMB, TROPONINI, RELINDX in the last 72 hours.  Iron/TIBC/Ferritin/ %Sat No results found for: IRON, TIBC, FERRITIN, IRONPCTSAT   EKG Orders placed or performed during the hospital encounter of 08/22/14  . EKG 12-Lead immediately post procedure  . EKG 12-Lead  . EKG 12-Lead  . EKG 12-Lead  . EKG 12-Lead immediately post procedure  . EKG 12-Lead  . EKG 12-Lead  . EKG 12-Lead     Prior Assessment and Plan Problem List as of 12/04/2014      Cardiovascular and Mediastinum   Essential hypertension   Last Assessment & Plan 08/09/2014 Office Visit Written 08/09/2014  2:23 PM by Lendon Colonel, NP    Currently well-controlled.  Is medically compliant.  No changes in medication regimen.  Await cardiac catheterization results for any adjustments, which may be necessary.      Unstable angina   ASCVD (arteriosclerotic cardiovascular disease)     Endocrine   DIABETIC FOOT ULCER, TOE   Diabetes type 2, controlled     Musculoskeletal and Integument   OSTEOARTHRITIS   Rotator cuff syndrome of right shoulder     Genitourinary   URINARY TRACT INFECTION   BENIGN PROSTATIC HYPERTROPHY   URINARY RETENTION     Other   Hyperlipidemia   HYPERKALEMIA   ERECTILE DYSFUNCTION   KNEE PAIN, LEFT   Shoulder pain, right   Chest pain on exertion   Last Assessment & Plan 08/09/2014 Office Visit Written 08/09/2014   2:24 PM by Lendon Colonel, NP    As stated, he had a high-risk abnormal nuclear medicine stress test read by Dr. Harl Bowie on 1/ 7/ 2016. As a result of these findings.  I discussed with the patient and Dr. Domenic Polite, who is in agreement, but the patient will need to have a cardiac catheterization and definitive evaluation of coronary anatomy.  I discussed the risks and benefits, possibility of intervention if necessary.  He verbalizes understanding and is willing to proceed.  All questions and concerns were answered.  His plan to have a cardiac catheterization on Monday, January 18th, 2016, with Dr. Martinique.  He is provided precardiac catheterization instructions, and all questions have been answered.      History of colonic polyps   Last Assessment & Plan 11/21/2014 Office Visit Written 11/21/2014 11:43 AM by Carlis Stable, NP    Repeat surveillance colonoscopy. Last colonoscopy was 5 years ago with diffuse tubular adenoma polyps removed. Recommended repeat in 5 years. In that time frame however the patient has had a cardiac catheterization with a drug-eluting stent placement approximately 3 months ago and is currently on Plavix. Also admits some residual morning chest pain. He is due to be seen by cardiology in approximately 2-3 weeks. Given his current Plavix anticoagulation and potential residual chest pain with atherosclerotic coronary disease we will hold off on colonoscopy right now until he can be seen and cleared by cardiology. Additionally, per recommendations, Plavix medication should not be held for a minimum of 6 months after DES placement. I  will consult with Dr. Gala Romney to find out when he is most comfortable with proceeding with a surveillance colonoscopy with possible polypectomy on Plavix, and notify the patient. Follow-up appointment tentatively scheduled for 3 months.          Imaging: No results found.

## 2014-12-04 NOTE — Patient Instructions (Signed)
Your physician wants you to follow-up in: 6 months with Jory Sims, NP. You will receive a reminder letter in the mail two months in advance. If you don't receive a letter, please call our office to schedule the follow-up appointment.  Your physician recommends that you continue on your current medications as directed. Please refer to the Current Medication list given to you today.  I have refilled your medications .   Thank you for choosing [facility]!

## 2014-12-05 ENCOUNTER — Other Ambulatory Visit: Payer: Self-pay | Admitting: *Deleted

## 2014-12-05 ENCOUNTER — Telehealth: Payer: Self-pay | Admitting: Nurse Practitioner

## 2014-12-05 MED ORDER — GLUCOSE BLOOD VI STRP: ORAL_STRIP | Status: DC

## 2014-12-05 NOTE — Telephone Encounter (Signed)
Cardiology note reviewed. Cardiology recommends on holding off on elective procedures until January 2017. Please notify the patient and place on recall list for this time.

## 2014-12-05 NOTE — Telephone Encounter (Signed)
ON RECALL LIST  °

## 2014-12-05 NOTE — Telephone Encounter (Signed)
Jerry Burns can you please NIC for TCS in 07/2015

## 2014-12-07 ENCOUNTER — Telehealth: Payer: Self-pay | Admitting: Internal Medicine

## 2014-12-07 NOTE — Telephone Encounter (Signed)
Noted. I discussed case with Juliann Pulse, NP and this is reasonable. We will place him on the NIC list for OV to re-evaluate for procedure in 12 months.

## 2014-12-07 NOTE — Telephone Encounter (Signed)
Wanted to leave message stating that he met with Jory Sims and she suggested he wait for his colonoscopy until he is done with plavix and if eric wants it done sooner to call her and she can take him off of it.  Patient number is [phone]

## 2014-12-07 NOTE — Telephone Encounter (Signed)
Routing to Eric  °

## 2014-12-10 ENCOUNTER — Other Ambulatory Visit: Payer: Self-pay | Admitting: Family Medicine

## 2014-12-10 NOTE — Telephone Encounter (Signed)
ON RECALL LIST  °

## 2014-12-11 DIAGNOSIS — Z961 Presence of intraocular lens: Secondary | ICD-10-CM | POA: Diagnosis not present

## 2014-12-11 DIAGNOSIS — E119 Type 2 diabetes mellitus without complications: Secondary | ICD-10-CM | POA: Diagnosis not present

## 2014-12-11 DIAGNOSIS — H4011X1 Primary open-angle glaucoma, mild stage: Secondary | ICD-10-CM | POA: Diagnosis not present

## 2014-12-11 DIAGNOSIS — Z794 Long term (current) use of insulin: Secondary | ICD-10-CM | POA: Diagnosis not present

## 2014-12-11 LAB — HM DIABETES EYE EXAM

## 2014-12-13 ENCOUNTER — Encounter: Payer: Self-pay | Admitting: *Deleted

## 2014-12-13 NOTE — Telephone Encounter (Signed)
I am not sure what to think of this discrepancy. Jerry Burns is a very on top of it older gentleman. Please discuss with him that the electronic record is giving [place] to different answers find out from him what milligram he is doing in order accordingly

## 2014-12-14 ENCOUNTER — Other Ambulatory Visit: Payer: Self-pay | Admitting: *Deleted

## 2014-12-14 MED ORDER — ATORVASTATIN CALCIUM 80 MG PO TABS: 80.0000 mg | ORAL_TABLET | Freq: Every day | ORAL | Status: DC

## 2015-01-09 ENCOUNTER — Encounter: Payer: Self-pay | Admitting: Internal Medicine

## 2015-01-21 ENCOUNTER — Telehealth: Payer: Self-pay | Admitting: Internal Medicine

## 2015-01-21 NOTE — Telephone Encounter (Signed)
Patient had seen EG in April and was on July recall to follow up. Patient called today saying that he wants to wait until January to follow up. I have added him back to the recall list for January 2017 to follow up with [place] per patient's request.

## 2015-01-22 NOTE — Telephone Encounter (Signed)
Noted  

## 2015-02-04 ENCOUNTER — Other Ambulatory Visit: Payer: Self-pay | Admitting: Family Medicine

## 2015-02-22 ENCOUNTER — Telehealth: Payer: Self-pay | Admitting: Family Medicine

## 2015-02-22 DIAGNOSIS — E119 Type 2 diabetes mellitus without complications: Secondary | ICD-10-CM

## 2015-02-22 DIAGNOSIS — I10 Essential (primary) hypertension: Secondary | ICD-10-CM

## 2015-02-22 DIAGNOSIS — E875 Hyperkalemia: Secondary | ICD-10-CM

## 2015-02-22 DIAGNOSIS — E785 Hyperlipidemia, unspecified: Secondary | ICD-10-CM

## 2015-02-22 NOTE — Telephone Encounter (Signed)
bw orders for appt 8/10  Last labs 11/02/14   Lip, Hep, BMP, A1C Micro Albumin, urine  Call when ready, not sure he understood [facility] is in the building here

## 2015-02-24 NOTE — Telephone Encounter (Signed)
Lipid, liver, metabolic 7, hemoglobin [id number], hyperlipidemia, type 2 diabetes

## 2015-02-25 NOTE — Telephone Encounter (Signed)
Blood work ordered in Epic. Patient notified. 

## 2015-02-27 DIAGNOSIS — E875 Hyperkalemia: Secondary | ICD-10-CM | POA: Diagnosis not present

## 2015-02-27 DIAGNOSIS — E119 Type 2 diabetes mellitus without complications: Secondary | ICD-10-CM | POA: Diagnosis not present

## 2015-02-27 DIAGNOSIS — E785 Hyperlipidemia, unspecified: Secondary | ICD-10-CM | POA: Diagnosis not present

## 2015-02-27 DIAGNOSIS — I10 Essential (primary) hypertension: Secondary | ICD-10-CM | POA: Diagnosis not present

## 2015-02-28 LAB — LIPID PANEL
CHOL/HDL RATIO: 3.1 ratio (ref 0.0–5.0)
Cholesterol, Total: 153 mg/dL (ref 100–199)
HDL: 50 mg/dL (ref >39)
LDL Calculated: 79 mg/dL (ref 0–99)
TRIGLYCERIDES: 120 mg/dL (ref 0–149)
VLDL CHOLESTEROL CAL: 24 mg/dL (ref 5–40)

## 2015-02-28 LAB — BASIC METABOLIC PANEL
BUN/Creatinine Ratio: 23 — ABNORMAL HIGH (ref 10–22)
BUN: 21 mg/dL (ref 8–27)
CO2: 26 mmol/L (ref 18–29)
CREATININE: 0.92 mg/dL (ref 0.76–1.27)
Calcium: 9.8 mg/dL (ref 8.6–10.2)
Chloride: 101 mmol/L (ref 97–108)
GFR calc Af Amer: 91 mL/min/{1.73_m2} (ref >59)
GFR, EST NON AFRICAN AMERICAN: 79 mL/min/{1.73_m2} (ref >59)
Glucose: 193 mg/dL — ABNORMAL HIGH (ref 65–99)
POTASSIUM: 5.9 mmol/L — AB (ref 3.5–5.2)
SODIUM: 139 mmol/L (ref 134–144)

## 2015-02-28 LAB — HEPATIC FUNCTION PANEL
ALT: 16 IU/L (ref 0–44)
AST: 17 IU/L (ref 0–40)
Albumin: 4.6 g/dL (ref 3.5–4.8)
Alkaline Phosphatase: 77 IU/L (ref 39–117)
BILIRUBIN, DIRECT: 0.13 mg/dL (ref 0.00–0.40)
Bilirubin Total: 0.5 mg/dL (ref 0.0–1.2)
Total Protein: 6.8 g/dL (ref 6.0–8.5)

## 2015-02-28 LAB — HEMOGLOBIN A1C
Est. average glucose Bld gHb Est-mCnc: 166 mg/dL
Hgb A1c MFr Bld: 7.4 % — ABNORMAL HIGH (ref 4.8–5.6)

## 2015-03-04 ENCOUNTER — Other Ambulatory Visit: Payer: Self-pay | Admitting: Family Medicine

## 2015-03-06 ENCOUNTER — Encounter: Payer: Self-pay | Admitting: Family Medicine

## 2015-03-06 ENCOUNTER — Ambulatory Visit (INDEPENDENT_AMBULATORY_CARE_PROVIDER_SITE_OTHER): Payer: Medicare Other | Admitting: Family Medicine

## 2015-03-06 VITALS — BP 128/82 | Ht 72.0 in | Wt 222.6 lb

## 2015-03-06 DIAGNOSIS — R21 Rash and other nonspecific skin eruption: Secondary | ICD-10-CM

## 2015-03-06 DIAGNOSIS — I10 Essential (primary) hypertension: Secondary | ICD-10-CM

## 2015-03-06 DIAGNOSIS — E119 Type 2 diabetes mellitus without complications: Secondary | ICD-10-CM

## 2015-03-06 DIAGNOSIS — I2510 Atherosclerotic heart disease of native coronary artery without angina pectoris: Secondary | ICD-10-CM | POA: Diagnosis not present

## 2015-03-06 DIAGNOSIS — E785 Hyperlipidemia, unspecified: Secondary | ICD-10-CM | POA: Diagnosis not present

## 2015-03-06 DIAGNOSIS — E875 Hyperkalemia: Secondary | ICD-10-CM

## 2015-03-06 NOTE — Addendum Note (Signed)
Addended by: Sallee Lange A on: 03/06/2015 10:46 PM   Modules accepted: Orders

## 2015-03-06 NOTE — Progress Notes (Addendum)
   Subjective:    Patient ID: Jerry Burns, male    DOB: 1934/02/21, 81 y.o.   MRN: [id number]  Diabetes He presents for his follow-up diabetic visit. He has type 2 diabetes mellitus. Pertinent negatives for hypoglycemia include no confusion. Pertinent negatives for diabetes include no chest pain, no fatigue, no polydipsia, no polyphagia and no weakness. Risk factors for coronary artery disease include diabetes mellitus, dyslipidemia and hypertension. Current diabetic treatment includes insulin injections and oral agent (dual therapy). He is compliant with treatment all of the time. He has not had a previous visit with a dietitian. He does not see a podiatrist.Eye exam is current.   Discuss recent lab work. Discuss possible need for dermatologist.  Patient relates tolerating his aspirin well he does take his blood pressure medicine watch his salt diet try to stay active he thinks he has been taking in too much potassium what is been eating he is not this out stop it recheck potassium today in addition to this he does take his cholesterol medicine. He is not having any low sugars a separate him time for which she thinks he just didn't eat enough he also states urination doing well thousand moving well. Denies any headaches or chest pain or shortness of breath.  Review of Systems  Constitutional: Negative for activity change, appetite change and fatigue.  HENT: Negative for congestion.   Respiratory: Negative for cough.   Cardiovascular: Negative for chest pain.  Gastrointestinal: Negative for abdominal pain.  Endocrine: Negative for polydipsia and polyphagia.  Neurological: Negative for weakness.  Psychiatric/Behavioral: Negative for confusion.       Objective:   Physical Exam  Constitutional: He appears well-nourished. No distress.  Cardiovascular: Normal rate, regular rhythm and normal heart sounds.   No murmur heard. Pulmonary/Chest: Effort normal and breath sounds normal. No  respiratory distress.  Musculoskeletal: He exhibits no edema.  Lymphadenopathy:    He has no cervical adenopathy.  Neurological: He is alert.  Psychiatric: His behavior is normal.  Vitals reviewed.         Assessment & Plan:  1. Hyperkalemia  we will recheck potassium level may have to reduce his ACE inhibitor - Potassium  hypertension good control continue current measures  hyperlipidemia taking medication doing well with this watch diet closely  regular exercise recommended Diabetes control good Cardiac disease stable no sign of flare up. Follow-up 3 months.  The patient with erythematous rash on the left lower back I do recommend patient referral to dermatology not quite sure what is causing this rash  Due to fluctuating sugars patient checks his glucose up to 3 times a day. Patient is on insulin. Patient brings his glucose readings in on his visits for our review.

## 2015-03-07 LAB — POTASSIUM: Potassium: 4.8 mmol/L (ref 3.5–5.2)

## 2015-03-18 DIAGNOSIS — X32XXXA Exposure to sunlight, initial encounter: Secondary | ICD-10-CM | POA: Diagnosis not present

## 2015-03-18 DIAGNOSIS — L570 Actinic keratosis: Secondary | ICD-10-CM | POA: Diagnosis not present

## 2015-03-18 DIAGNOSIS — B355 Tinea imbricata: Secondary | ICD-10-CM | POA: Diagnosis not present

## 2015-03-19 DIAGNOSIS — H4011X1 Primary open-angle glaucoma, mild stage: Secondary | ICD-10-CM | POA: Diagnosis not present

## 2015-03-28 DIAGNOSIS — I200 Unstable angina: Secondary | ICD-10-CM

## 2015-03-28 HISTORY — DX: Unstable angina: I20.0

## 2015-03-29 ENCOUNTER — Telehealth: Payer: Self-pay | Admitting: Family Medicine

## 2015-03-29 NOTE — Telephone Encounter (Signed)
Pt dropped off paper work about his diabetes   Left it in your basket in your office

## 2015-04-05 NOTE — Telephone Encounter (Signed)
Please let the patient know that his potassium was normal, I sent a note to him via his my chart, it is listed as active on our EMR, if he does not one result messages being sent there I  Recommend that he deactivate his "my chart" otherwise routine labs are answered via my chart if they are normal, as for his glucose readings we will go ahead and scan the results into the chart. Keep regular follow-up visits

## 2015-04-05 NOTE — Telephone Encounter (Signed)
Discussed with pt. He does use mychart.

## 2015-04-07 NOTE — Progress Notes (Signed)
Cardiology Office Note   Date:  04/08/2015   ID:  Jerry Burns, DOB 1933-08-16, MRN [id number]  PCP:  Sallee Lange, MD  Cardiologist: Woodroe Chen, NP   Chief Complaint  Patient presents with  . Coronary Artery Disease  . Hypertension      History of Present Illness: Jerry Burns is a 81 y.o. male who presents for ongoing assessment and management of CAD, with DES to mid LAD 07/2014, and hypertension. He was last seen on 12/04/2014 with plans to have colonoscopy. He was advised to hold off on elective procedures for one year, and it was not recommended that he have the elective procedure until January of 2017.   He states that he is having worsening chest pain, with minimal activity. Walking less than 100 feet. 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 This has become concerning to him. He denies diaphoresis, but does state that he is more fatigued than usual lately.Hehas been medically compliant.   ______________________________________________________________________________________________________________________________________________ Left main: No obstructive disease.   Left Anterior Descending Artery: Large caliber vessel that courses to the apex. The proximal and mid vessel has diffuse 40% stenosis but no lesions that appear to be flow limiting. There is a focal 90% stenosis in the mid segment. The apical LAD has mild plaque disease. There are three diagonal branches. The first two are moderate in caliber and have mild plaque disease. The third diagonal branch is small in caliber with 60% ostial stenosis.   Circumflex Artery: Moderate to large caliber vessel with 30% proximal stenosis. The obtuse marginal branch is moderate in caliber with diffuse mild plaque.   Right Coronary Artery: Large caliber dominant vessel with diffuse 20% stenosis in the mid vessel. The PDA and posterolateral  branches are moderate in caliber. The PDa has mild plaque. The posterolateral branch has a 50% proximal stenosis.   Left Ventricular Angiogram: Deferred.   Impression: 1. Double vessel CAD 2. Severe stenosis mid LAD/unstable angina 3. Successful PTCA/DES x mid LAD 4. Moderate residual disease proximal and mid LAD and posterolateral branch of the RCA ______________________________________________________________________________________________________________________________________________ Past Medical History  Diagnosis Date  . Hyperlipidemia   . Stroke   . Arthritis     Bilateral knee  . Hypertension   . Coronary artery disease   . Anginal pain   . Diabetes mellitus     insulin dependent     Past Surgical History  Procedure Laterality Date  . Tonsillectomy    . Appendectomy    . Shoulder surgery    . Colonoscopy  2011    RMR: 1. Normal rectum 2. Left sided diverticula , 2 ascending colon polyps, status post snare polypectomy. Remainder of colonic mucosa appeared unremarkable.   . Coronary angioplasty with stent placement  08/22/2014    PTCA/DES X MID LAD    by Dr Julianne Handler  . Foot surgery Right   . Left heart catheterization with coronary angiogram N/A 08/22/2014    Procedure: LEFT HEART CATHETERIZATION WITH CORONARY ANGIOGRAM;  Surgeon: Burnell Blanks, MD;  Location: [facility] CATH LAB;  Service: Cardiovascular;  Laterality: N/A;  . Percutaneous coronary stent intervention (pci-s)  08/22/2014    Procedure: PERCUTANEOUS CORONARY STENT INTERVENTION (PCI-S);  Surgeon: Burnell Blanks, MD;      Current Outpatient Prescriptions  Medication Sig Dispense Refill  . amLODipine (NORVASC) 5 MG tablet TAKE 1 BY MOUTH DAILY 90 tablet 3  .  aspirin EC 81 MG tablet Take 81 mg by mouth daily.    Marland Kitchen atorvastatin (LIPITOR) 80 MG tablet Take 1 tablet (80 mg total) by mouth daily. 90 tablet 0  . brimonidine-timolol (COMBIGAN) 0.2-0.5 % ophthalmic solution Place 1 drop into the left  eye 2 (two) times daily.    . carvedilol (COREG) 6.25 MG tablet TAKE (1) TABLET BY MOUTH TWICE DAILY. 180 tablet 3  . clopidogrel (PLAVIX) 75 MG tablet Take 1 tablet (75 mg total) by mouth daily with breakfast. 30 tablet 11  . dutasteride (AVODART) 0.5 MG capsule TAKE 1 BY MOUTH DAILY 90 capsule 2  . glipiZIDE (GLUCOTROL) 5 MG tablet TAKE 1/2 TABLET BY MOUTH TWO TIMES DAILY BEFORE A MEAL. 30 tablet 5  . glucose blood (BAYER CONTOUR TEST) test strip USE TO TEST TWICE DAILY 100 each 1  . isosorbide mononitrate (IMDUR) 30 MG 24 hr tablet Take 1 tablet (30 mg total) by mouth daily. 90 tablet 3  . LANTUS SOLOSTAR 100 UNIT/ML Solostar Pen INJECT 31 TO 34 UNITS SUBCUTANEOUSLY AT BEDTIME 30 mL 1  . metFORMIN (GLUCOPHAGE) 1000 MG tablet TAKE 1 BY MOUTH TWICE DAILY WITH A MEAL 180 tablet 0  . mometasone (ELOCON) 0.1 % cream Apply to affected area daily 45 g 1  . Multiple Vitamin (MULTIVITAMIN) tablet Take 1 tablet by mouth daily.    . nitroGLYCERIN (NITROSTAT) 0.4 MG SL tablet Place 1 tablet (0.4 mg total) under the tongue every 5 (five) minutes as needed for chest pain. 90 tablet 3  . quinapril (ACCUPRIL) 40 MG tablet TAKE 1 BY MOUTH AT BEDTIME 90 tablet 0  . RELION MINI PEN NEEDLES 31G X 6 MM MISC USE AS DIRECTED 100 each 0  . sulfacetamide (BLEPH-10) 10 % ophthalmic solution INSTILL 4 DROPS INTO THE AFFECTED EYE(S) 4 TIMES DAILY FOR 5 DAYS. 15 mL 0  . tamsulosin (FLOMAX) 0.4 MG CAPS capsule TAKE 1 BY MOUTH DAILY 90 capsule 1   No current facility-administered medications for this visit.    Allergies:   Latex    Social History:  The patient  reports that he quit smoking about 7 years ago. His smoking use included Cigarettes and Cigars. He started smoking about 59 years ago. He has a 20 pack-year smoking history. He has never used smokeless tobacco. He reports that he drinks alcohol. He reports that he does not use illicit drugs.   Family History:  The patient's family history includes Colon cancer  in his paternal uncle; Diabetes in his brother and another family member.    ROS: All other systems are reviewed and negative. Unless otherwise mentioned in H&P    PHYSICAL EXAM: VS:  BP 126/72 mmHg  Pulse 67  Ht 5\' 11"  (1.803 m)  Wt 228 lb 9.6 oz (103.692 kg)  BMI 31.90 kg/m2  SpO2 96% , BMI Body mass index is 31.9 kg/(m^2). GEN: Well nourished, well developed, in no acute distress HEENT: normal Neck: no JVD, carotid bruits, or masses Cardiac: RRR; no murmurs, rubs, or gallops,no edema  Respiratory:  clear to auscultation bilaterally, normal work of breathing GI: soft, nontender, nondistended, + BS MS: no deformity or atrophy Skin: warm and dry, no rash Neuro:  Strength and sensation are intact Psych: euthymic mood, full affect   EKG:  The ekg ordered today demonstrates NSR with non-specific T wave abnormalities in the infero/lateral leads. Rate of 63.    Recent Labs: 08/23/2014: Hemoglobin 13.2; Platelets 184 02/27/2015: ALT 16; BUN 21; Creatinine, Ser  0.92; Sodium 139 03/06/2015: Potassium 4.8    Lipid Panel    Component Value Date/Time   CHOL 153 02/27/2015 0804   CHOL 155 07/31/2014 0703   TRIG 120 02/27/2015 0804   HDL 50 02/27/2015 0804   HDL 53 07/31/2014 0703   CHOLHDL 3.1 02/27/2015 0804   CHOLHDL 2.9 07/31/2014 0703   VLDL 19 07/31/2014 0703   LDLCALC 79 02/27/2015 0804   LDLCALC 83 07/31/2014 0703      Wt Readings from Last 3 Encounters:  04/08/15 228 lb 9.6 oz (103.692 kg)  03/06/15 222 lb 9.6 oz (100.971 kg)  12/04/14 222 lb (100.699 kg)      ASSESSMENT AND PLAN:  1. Angina: Reminiscent of previous anginal pain occuring with exertion and walking up incline. Known hx of CAD with most recent cath in January of 2016 with stent to the LAD. He had residual disease in the RCA, Cx and OM1. I have discussed having repeat catheterization with the patient who verbalizes understanding and is willing to proceed. I have discussed this with Dr. Johnsie Cancel on site  who agrees with my plan and assessment. Will plan cardiac cath on Friday first case with Dr. Burt Knack. He will continue al medications.   2. CAD: As discussed above. Continue statin, BB, Plavix, ASA, and ACE.   3. Hyperlipidemia: Follow up labs in 3 months. He is followed by Dr. Wolfgang Phoenix for this.  4. Diabetes: He is first case in am of Friday 04/12/2015. He knows to be NPO and hold insulin.    Current medicines are reviewed at length with the patient today.    Labs/ tests ordered today include: Cardiac cath with pre-cath labs.  Orders Placed This Encounter  Procedures  . EKG 12-Lead     Disposition:   FU with post cath  Signed, Jory Sims, NP  04/08/2015 3:37 PM    [facility] [address], [place], [place] [address] Phone: [phone]; Fax: [phone]

## 2015-04-08 ENCOUNTER — Encounter: Payer: Self-pay | Admitting: Adult Health

## 2015-04-08 ENCOUNTER — Encounter: Payer: Self-pay | Admitting: *Deleted

## 2015-04-08 ENCOUNTER — Other Ambulatory Visit: Payer: Self-pay | Admitting: Adult Health

## 2015-04-08 ENCOUNTER — Ambulatory Visit (INDEPENDENT_AMBULATORY_CARE_PROVIDER_SITE_OTHER): Payer: Medicare Other | Admitting: Adult Health

## 2015-04-08 VITALS — BP 126/72 | HR 67 | Ht 71.0 in | Wt 228.6 lb

## 2015-04-08 DIAGNOSIS — I209 Angina pectoris, unspecified: Secondary | ICD-10-CM | POA: Diagnosis not present

## 2015-04-08 DIAGNOSIS — I2510 Atherosclerotic heart disease of native coronary artery without angina pectoris: Secondary | ICD-10-CM

## 2015-04-08 DIAGNOSIS — I10 Essential (primary) hypertension: Secondary | ICD-10-CM | POA: Diagnosis not present

## 2015-04-08 NOTE — Patient Instructions (Signed)
Your physician recommends that you continue on your current medications as directed. Please refer to the Current Medication list given to you today. Your physician has requested that you have a cardiac catheterization. Cardiac catheterization is used to diagnose and/or treat various heart conditions. Doctors may recommend this procedure for a number of different reasons. The most common reason is to evaluate chest pain. Chest pain can be a symptom of coronary artery disease (CAD), and cardiac catheterization can show whether plaque is narrowing or blocking your heart's arteries. This procedure is also used to evaluate the valves, as well as measure the blood flow and oxygen levels in different parts of your heart. For further information please visit [email or web address]. Please follow instruction sheet, as given. Follow up after heart cath.

## 2015-04-08 NOTE — Progress Notes (Deleted)
Name: Jerry Burns    DOB: 21-Aug-1933  Age: 81 y.o.  MR#: [id number]       PCP:  Sallee Lange, MD      Insurance: Payor: MEDICARE / Plan: MEDICARE PART A AND B / Product Type: *No Product type* /   CC:   No chief complaint on file.   VS Filed Vitals:   04/08/15 1447  BP: 126/72  Pulse: 67  Height: 5\' 11"  (1.803 m)  Weight: 228 lb 9.6 oz (103.692 kg)  SpO2: 96%    Weights Current Weight  04/08/15 228 lb 9.6 oz (103.692 kg)  03/06/15 222 lb 9.6 oz (100.971 kg)  12/04/14 222 lb (100.699 kg)    Blood Pressure  BP Readings from Last 3 Encounters:  04/08/15 126/72  03/06/15 128/82  12/04/14 158/66     Admit date:  (Not on file) Last encounter with RMR:  12/04/2014   Allergy Latex  Current Outpatient Prescriptions  Medication Sig Dispense Refill  . amLODipine (NORVASC) 5 MG tablet TAKE 1 BY MOUTH DAILY 90 tablet 3  . aspirin EC 81 MG tablet Take 81 mg by mouth daily.    Marland Kitchen atorvastatin (LIPITOR) 80 MG tablet Take 1 tablet (80 mg total) by mouth daily. 90 tablet 0  . brimonidine-timolol (COMBIGAN) 0.2-0.5 % ophthalmic solution Place 1 drop into the left eye 2 (two) times daily.    . carvedilol (COREG) 6.25 MG tablet TAKE (1) TABLET BY MOUTH TWICE DAILY. 180 tablet 3  . clopidogrel (PLAVIX) 75 MG tablet Take 1 tablet (75 mg total) by mouth daily with breakfast. 30 tablet 11  . dutasteride (AVODART) 0.5 MG capsule TAKE 1 BY MOUTH DAILY 90 capsule 2  . glipiZIDE (GLUCOTROL) 5 MG tablet TAKE 1/2 TABLET BY MOUTH TWO TIMES DAILY BEFORE A MEAL. 30 tablet 5  . glucose blood (BAYER CONTOUR TEST) test strip USE TO TEST TWICE DAILY 100 each 1  . isosorbide mononitrate (IMDUR) 30 MG 24 hr tablet Take 1 tablet (30 mg total) by mouth daily. 90 tablet 3  . LANTUS SOLOSTAR 100 UNIT/ML Solostar Pen INJECT 31 TO 34 UNITS SUBCUTANEOUSLY AT BEDTIME 30 mL 1  . metFORMIN (GLUCOPHAGE) 1000 MG tablet TAKE 1 BY MOUTH TWICE DAILY WITH A MEAL 180 tablet 0  . mometasone (ELOCON) 0.1 % cream Apply to  affected area daily 45 g 1  . Multiple Vitamin (MULTIVITAMIN) tablet Take 1 tablet by mouth daily.    . nitroGLYCERIN (NITROSTAT) 0.4 MG SL tablet Place 1 tablet (0.4 mg total) under the tongue every 5 (five) minutes as needed for chest pain. 90 tablet 3  . quinapril (ACCUPRIL) 40 MG tablet TAKE 1 BY MOUTH AT BEDTIME 90 tablet 0  . RELION MINI PEN NEEDLES 31G X 6 MM MISC USE AS DIRECTED 100 each 0  . sulfacetamide (BLEPH-10) 10 % ophthalmic solution INSTILL 4 DROPS INTO THE AFFECTED EYE(S) 4 TIMES DAILY FOR 5 DAYS. 15 mL 0  . tamsulosin (FLOMAX) 0.4 MG CAPS capsule TAKE 1 BY MOUTH DAILY 90 capsule 1   No current facility-administered medications for this visit.    Discontinued Meds:   There are no discontinued medications.  Patient Active Problem List   Diagnosis Date Noted  . History of colonic polyps 11/21/2014  . ASCVD (arteriosclerotic cardiovascular disease) 09/03/2014  . Unstable angina 08/22/2014  . Diabetes type 2, controlled 08/08/2014  . Chest pain on exertion 07/23/2014  . Rotator cuff syndrome of right shoulder 12/16/2011  . Shoulder pain, right  12/16/2011  . HYPERKALEMIA 01/31/2009  . KNEE PAIN, LEFT 07/31/2008  . DIABETIC FOOT ULCER, TOE 12/23/2007  . BENIGN PROSTATIC HYPERTROPHY 01/26/2007  . URINARY TRACT INFECTION 01/12/2007  . URINARY RETENTION 01/12/2007  . ERECTILE DYSFUNCTION 11/01/2006  . Hyperlipidemia 08/04/2006  . Essential hypertension 08/04/2006  . OSTEOARTHRITIS 08/04/2006    LABS    Component Value Date/Time   NA 139 02/27/2015 0804   NA 138 11/02/2014 0806   NA 139 09/28/2014 0815   NA 137 08/23/2014 0525   NA 137 08/13/2014 0730   NA 140 04/23/2014 0704   K 4.8 03/06/2015 1016   K 5.9* 02/27/2015 0804   K 4.9 11/02/2014 0806   CL 101 02/27/2015 0804   CL 101 11/02/2014 0806   CL 99 09/28/2014 0815   CO2 26 02/27/2015 0804   CO2 23 11/02/2014 0806   CO2 22 09/28/2014 0815   GLUCOSE 193* 02/27/2015 0804   GLUCOSE 198* 11/02/2014 0806    GLUCOSE 167* 09/28/2014 0815   GLUCOSE 172* 08/23/2014 0525   GLUCOSE 193* 08/13/2014 0730   GLUCOSE 132* 04/23/2014 0704   BUN 21 02/27/2015 0804   BUN 28* 11/02/2014 0806   BUN 25 09/28/2014 0815   BUN 14 08/23/2014 0525   BUN 27* 08/13/2014 0730   BUN 20 04/23/2014 0704   CREATININE 0.92 02/27/2015 0804   CREATININE 0.94 11/02/2014 0806   CREATININE 0.99 09/28/2014 0815   CREATININE 0.93 04/23/2014 0704   CREATININE 0.89 10/26/2013 0712   CREATININE 1.01 12/21/2012 0720   CALCIUM 9.8 02/27/2015 0804   CALCIUM 9.4 11/02/2014 0806   CALCIUM 9.6 09/28/2014 0815   GFRNONAA 79 02/27/2015 0804   GFRNONAA 77 11/02/2014 0806   GFRNONAA 72 09/28/2014 0815   GFRAA 91 02/27/2015 0804   GFRAA 89 11/02/2014 0806   GFRAA 83 09/28/2014 0815   CMP     Component Value Date/Time   NA 139 02/27/2015 0804   NA 137 08/23/2014 0525   K 4.8 03/06/2015 1016   CL 101 02/27/2015 0804   CO2 26 02/27/2015 0804   GLUCOSE 193* 02/27/2015 0804   GLUCOSE 172* 08/23/2014 0525   BUN 21 02/27/2015 0804   BUN 14 08/23/2014 0525   CREATININE 0.92 02/27/2015 0804   CREATININE 0.93 04/23/2014 0704   CALCIUM 9.8 02/27/2015 0804   PROT 6.8 02/27/2015 0804   PROT 6.4 04/23/2014 0704   ALBUMIN 4.0 04/23/2014 0704   AST 17 02/27/2015 0804   ALT 16 02/27/2015 0804   ALKPHOS 77 02/27/2015 0804   BILITOT 0.5 02/27/2015 0804   BILITOT 0.5 04/23/2014 0704   GFRNONAA 79 02/27/2015 0804   GFRAA 91 02/27/2015 0804       Component Value Date/Time   WBC 5.3 08/23/2014 0525   WBC 5.5 08/13/2014 0730   WBC 5.9 01/30/2009 2332   HGB 13.2 08/23/2014 0525   HGB 11.1* 08/13/2014 0730   HGB 13.0 01/30/2009 2332   HCT 38.8* 08/23/2014 0525   HCT 33.0* 08/13/2014 0730   HCT 37.7* 01/30/2009 2332   MCV 88.4 08/23/2014 0525   MCV 87.8 08/13/2014 0730   MCV 88.5 01/30/2009 2332    Lipid Panel     Component Value Date/Time   CHOL 153 02/27/2015 0804   CHOL 155 07/31/2014 0703   TRIG 120 02/27/2015 0804    HDL 50 02/27/2015 0804   HDL 53 07/31/2014 0703   CHOLHDL 3.1 02/27/2015 0804   CHOLHDL 2.9 07/31/2014 0703   VLDL 19 07/31/2014 0703  [place] 79 02/27/2015 0804   LDLCALC 83 07/31/2014 0703    ABG No results found for: PHART, PCO2ART, PO2ART, HCO3, TCO2, ACIDBASEDEF, O2SAT   No results found for: TSH BNP (last 3 results) No results for input(s): BNP in the last 8760 hours.  ProBNP (last 3 results) No results for input(s): PROBNP in the last 8760 hours.  Cardiac Panel (last 3 results) No results for input(s): CKTOTAL, CKMB, TROPONINI, RELINDX in the last 72 hours.  Iron/TIBC/Ferritin/ %Sat No results found for: IRON, TIBC, FERRITIN, IRONPCTSAT   EKG Orders placed or performed during the hospital encounter of 08/22/14  . EKG 12-Lead immediately post procedure  . EKG 12-Lead  . EKG 12-Lead  . EKG 12-Lead  . EKG 12-Lead immediately post procedure  . EKG 12-Lead  . EKG 12-Lead  . EKG 12-Lead     Prior Assessment and Plan Problem List as of 04/08/2015      Cardiovascular and Mediastinum   Essential hypertension   Last Assessment & Plan 08/09/2014 Office Visit Written 08/09/2014  2:23 PM by Lendon Colonel, NP    Currently well-controlled.  Is medically compliant.  No changes in medication regimen.  Await cardiac catheterization results for any adjustments, which may be necessary.      Unstable angina   ASCVD (arteriosclerotic cardiovascular disease)     Endocrine   DIABETIC FOOT ULCER, TOE   Diabetes type 2, controlled     Musculoskeletal and Integument   OSTEOARTHRITIS   Rotator cuff syndrome of right shoulder     Genitourinary   URINARY TRACT INFECTION   BENIGN PROSTATIC HYPERTROPHY   URINARY RETENTION     Other   Hyperlipidemia   HYPERKALEMIA   ERECTILE DYSFUNCTION   KNEE PAIN, LEFT   Shoulder pain, right   Chest pain on exertion   Last Assessment & Plan 08/09/2014 Office Visit Written 08/09/2014  2:24 PM by Lendon Colonel, NP    As stated, he  had a high-risk abnormal nuclear medicine stress test read by Dr. Harl Bowie on 1/ 7/ 2016. As a result of these findings.  I discussed with the patient and Dr. Domenic Polite, who is in agreement, but the patient will need to have a cardiac catheterization and definitive evaluation of coronary anatomy.  I discussed the risks and benefits, possibility of intervention if necessary.  He verbalizes understanding and is willing to proceed.  All questions and concerns were answered.  His plan to have a cardiac catheterization on Monday, January 18th, 2016, with Dr. Martinique.  He is provided precardiac catheterization instructions, and all questions have been answered.      History of colonic polyps   Last Assessment & Plan 11/21/2014 Office Visit Written 11/21/2014 11:43 AM by Carlis Stable, NP    Repeat surveillance colonoscopy. Last colonoscopy was 5 years ago with diffuse tubular adenoma polyps removed. Recommended repeat in 5 years. In that time frame however the patient has had a cardiac catheterization with a drug-eluting stent placement approximately 3 months ago and is currently on Plavix. Also admits some residual morning chest pain. He is due to be seen by cardiology in approximately 2-3 weeks. Given his current Plavix anticoagulation and potential residual chest pain with atherosclerotic coronary disease we will hold off on colonoscopy right now until he can be seen and cleared by cardiology. Additionally, per recommendations, Plavix medication should not be held for a minimum of 6 months after DES placement. I will consult with Dr. Gala Romney to find out when he  is most comfortable with proceeding with a surveillance colonoscopy with possible polypectomy on Plavix, and notify the patient. Follow-up appointment tentatively scheduled for 3 months.          Imaging: No results found.

## 2015-04-12 ENCOUNTER — Encounter (HOSPITAL_COMMUNITY): Payer: Self-pay | Admitting: Cardiovascular Disease

## 2015-04-12 ENCOUNTER — Other Ambulatory Visit: Payer: Self-pay

## 2015-04-12 ENCOUNTER — Encounter (HOSPITAL_COMMUNITY)
Admission: RE | Disposition: A | Discharge status: Home / Self Care | Payer: Self-pay | Source: Ambulatory Visit | Attending: Cardiovascular Disease

## 2015-04-12 ENCOUNTER — Ambulatory Visit (HOSPITAL_COMMUNITY)
Admission: RE | Admit: 2015-04-12 | Discharge: 2015-04-13 | Disposition: A | Discharge status: Home / Self Care | Payer: Medicare Other | Source: Ambulatory Visit | Attending: Cardiovascular Disease | Admitting: Cardiovascular Disease

## 2015-04-12 DIAGNOSIS — I10 Essential (primary) hypertension: Secondary | ICD-10-CM | POA: Insufficient documentation

## 2015-04-12 DIAGNOSIS — E785 Hyperlipidemia, unspecified: Secondary | ICD-10-CM | POA: Diagnosis not present

## 2015-04-12 DIAGNOSIS — R079 Chest pain, unspecified: Secondary | ICD-10-CM | POA: Diagnosis present

## 2015-04-12 DIAGNOSIS — Z8673 Personal history of transient ischemic attack (TIA), and cerebral infarction without residual deficits: Secondary | ICD-10-CM | POA: Insufficient documentation

## 2015-04-12 DIAGNOSIS — I25110 Atherosclerotic heart disease of native coronary artery with unstable angina pectoris: Secondary | ICD-10-CM | POA: Insufficient documentation

## 2015-04-12 DIAGNOSIS — Z7902 Long term (current) use of antithrombotics/antiplatelets: Secondary | ICD-10-CM | POA: Diagnosis not present

## 2015-04-12 DIAGNOSIS — Z955 Presence of coronary angioplasty implant and graft: Secondary | ICD-10-CM | POA: Insufficient documentation

## 2015-04-12 DIAGNOSIS — Z794 Long term (current) use of insulin: Secondary | ICD-10-CM | POA: Insufficient documentation

## 2015-04-12 DIAGNOSIS — I208 Other forms of angina pectoris: Secondary | ICD-10-CM | POA: Diagnosis present

## 2015-04-12 DIAGNOSIS — Z7982 Long term (current) use of aspirin: Secondary | ICD-10-CM | POA: Diagnosis not present

## 2015-04-12 DIAGNOSIS — Z87891 Personal history of nicotine dependence: Secondary | ICD-10-CM | POA: Insufficient documentation

## 2015-04-12 DIAGNOSIS — I2510 Atherosclerotic heart disease of native coronary artery without angina pectoris: Secondary | ICD-10-CM

## 2015-04-12 DIAGNOSIS — E119 Type 2 diabetes mellitus without complications: Secondary | ICD-10-CM | POA: Diagnosis not present

## 2015-04-12 DIAGNOSIS — I2089 Other forms of angina pectoris: Secondary | ICD-10-CM | POA: Diagnosis present

## 2015-04-12 HISTORY — DX: Unstable angina: I20.0

## 2015-04-12 HISTORY — PX: CARDIAC CATHETERIZATION: SHX172

## 2015-04-12 LAB — GLUCOSE, CAPILLARY
GLUCOSE-CAPILLARY: 189 mg/dL — AB (ref 65–99)
GLUCOSE-CAPILLARY: 169 mg/dL — AB (ref 65–99)
Glucose-Capillary: 208 mg/dL — ABNORMAL HIGH (ref 65–99)
Glucose-Capillary: 170 mg/dL — ABNORMAL HIGH (ref 65–99)
Glucose-Capillary: 92 mg/dL (ref 65–99)

## 2015-04-12 LAB — BASIC METABOLIC PANEL
Anion gap: 8 (ref 5–15)
BUN: 28 mg/dL — ABNORMAL HIGH (ref 6–20)
CALCIUM: 9.2 mg/dL (ref 8.9–10.3)
CO2: 24 mmol/L (ref 22–32)
CREATININE: 0.97 mg/dL (ref 0.61–1.24)
Chloride: 106 mmol/L (ref 101–111)
GFR calc Af Amer: >60 mL/min (ref >60)
GLUCOSE: 201 mg/dL — AB (ref 65–99)
Potassium: 4.4 mmol/L (ref 3.5–5.1)
Sodium: 138 mmol/L (ref 135–145)

## 2015-04-12 LAB — CBC
HCT: 33.8 % — ABNORMAL LOW (ref 39.0–52.0)
HEMOGLOBIN: 11.5 g/dL — AB (ref 13.0–17.0)
MCH: 30.2 pg (ref 26.0–34.0)
MCHC: 34 g/dL (ref 30.0–36.0)
MCV: 88.7 fL (ref 78.0–100.0)
Platelets: 198 10*3/uL (ref 150–400)
RBC: 3.81 MIL/uL — ABNORMAL LOW (ref 4.22–5.81)
RDW: 12.6 % (ref 11.5–15.5)
WBC: 4.5 10*3/uL (ref 4.0–10.5)

## 2015-04-12 LAB — POCT ACTIVATED CLOTTING TIME
ACTIVATED CLOTTING TIME: 263 s
Activated Clotting Time: 214 seconds
Activated Clotting Time: 233 seconds

## 2015-04-12 LAB — PROTIME-INR
INR: 1.11 (ref 0.00–1.49)
PROTHROMBIN TIME: 14.5 s (ref 11.6–15.2)

## 2015-04-12 SURGERY — LEFT HEART CATH AND CORONARY ANGIOGRAPHY

## 2015-04-12 MED ORDER — LIDOCAINE HCL (PF) 1 % IJ SOLN
INTRAMUSCULAR | Status: AC
  Filled 2015-04-12: qty 30

## 2015-04-12 MED ORDER — SODIUM CHLORIDE 0.9 % IV SOLN: INTRAVENOUS | Status: AC

## 2015-04-12 MED ORDER — NITROGLYCERIN 1 MG/10 ML FOR IR/CATH LAB
INTRA_ARTERIAL | Status: AC
  Filled 2015-04-12: qty 10

## 2015-04-12 MED ORDER — INSULIN ASPART 100 UNIT/ML ~~LOC~~ SOLN
0.0000 [IU] | Freq: Three times a day (TID) | SUBCUTANEOUS | Status: DC
  Administered 2015-04-12: 3 [IU] via SUBCUTANEOUS
  Administered 2015-04-13: 5 [IU] via SUBCUTANEOUS

## 2015-04-12 MED ORDER — BRIMONIDINE TARTRATE 0.2 % OP SOLN
1.0000 [drp] | Freq: Two times a day (BID) | OPHTHALMIC | Status: DC
  Administered 2015-04-12: 1 [drp] via OPHTHALMIC
  Filled 2015-04-12: qty 5

## 2015-04-12 MED ORDER — CARVEDILOL 3.125 MG PO TABS
6.2500 mg | ORAL_TABLET | Freq: Two times a day (BID) | ORAL | Status: DC
  Administered 2015-04-12 – 2015-04-13 (×2): 6.25 mg via ORAL
  Filled 2015-04-12 (×2): qty 2

## 2015-04-12 MED ORDER — ONDANSETRON HCL 4 MG/2ML IJ SOLN: 4.0000 mg | Freq: Four times a day (QID) | INTRAMUSCULAR | Status: DC | PRN

## 2015-04-12 MED ORDER — MIDAZOLAM HCL 2 MG/2ML IJ SOLN
INTRAMUSCULAR | Status: AC
  Filled 2015-04-12: qty 4

## 2015-04-12 MED ORDER — SODIUM CHLORIDE 0.9 % IJ SOLN: 3.0000 mL | Freq: Two times a day (BID) | INTRAMUSCULAR | Status: DC

## 2015-04-12 MED ORDER — SODIUM CHLORIDE 0.9 % IJ SOLN: 3.0000 mL | INTRAMUSCULAR | Status: DC | PRN

## 2015-04-12 MED ORDER — IOHEXOL 350 MG/ML SOLN
INTRAVENOUS | Status: DC | PRN
  Administered 2015-04-12: 180 mL via INTRA_ARTERIAL

## 2015-04-12 MED ORDER — NITROGLYCERIN 1 MG/10 ML FOR IR/CATH LAB
INTRA_ARTERIAL | Status: DC | PRN
  Administered 2015-04-12: 09:00:00

## 2015-04-12 MED ORDER — INSULIN GLARGINE 100 UNIT/ML ~~LOC~~ SOLN
25.0000 [IU] | Freq: Every day | SUBCUTANEOUS | Status: DC
Start: 2015-04-12 — End: 2015-04-13
  Administered 2015-04-12: 25 [IU] via SUBCUTANEOUS
  Filled 2015-04-12 (×2): qty 0.25

## 2015-04-12 MED ORDER — SODIUM CHLORIDE 0.9 % IV SOLN: 250.0000 mL | INTRAVENOUS | Status: DC | PRN

## 2015-04-12 MED ORDER — FENTANYL CITRATE (PF) 100 MCG/2ML IJ SOLN
INTRAMUSCULAR | Status: DC | PRN
  Administered 2015-04-12: 25 ug via INTRAVENOUS

## 2015-04-12 MED ORDER — NITROGLYCERIN 0.4 MG SL SUBL: 0.4000 mg | SUBLINGUAL_TABLET | SUBLINGUAL | Status: DC | PRN

## 2015-04-12 MED ORDER — ANGIOPLASTY BOOK
Freq: Once | Status: AC
  Administered 2015-04-12: 21:00:00
  Filled 2015-04-12: qty 1

## 2015-04-12 MED ORDER — MIDAZOLAM HCL 2 MG/2ML IJ SOLN
INTRAMUSCULAR | Status: DC | PRN
  Administered 2015-04-12: 2 mg via INTRAVENOUS

## 2015-04-12 MED ORDER — AMLODIPINE BESYLATE 5 MG PO TABS
5.0000 mg | ORAL_TABLET | Freq: Every day | ORAL | Status: DC
  Administered 2015-04-13: 07:00:00 5 mg via ORAL
  Filled 2015-04-12: qty 1

## 2015-04-12 MED ORDER — BRIMONIDINE TARTRATE-TIMOLOL 0.2-0.5 % OP SOLN: 1.0000 [drp] | Freq: Two times a day (BID) | OPHTHALMIC | Status: DC

## 2015-04-12 MED ORDER — HEPARIN (PORCINE) IN NACL 2-0.9 UNIT/ML-% IJ SOLN
INTRAMUSCULAR | Status: AC
  Filled 2015-04-12: qty 1500

## 2015-04-12 MED ORDER — FENTANYL CITRATE (PF) 100 MCG/2ML IJ SOLN
INTRAMUSCULAR | Status: AC
  Filled 2015-04-12: qty 4

## 2015-04-12 MED ORDER — HEPARIN SODIUM (PORCINE) 1000 UNIT/ML IJ SOLN
INTRAMUSCULAR | Status: AC
  Filled 2015-04-12: qty 1

## 2015-04-12 MED ORDER — TAMSULOSIN HCL 0.4 MG PO CAPS
0.4000 mg | ORAL_CAPSULE | Freq: Every day | ORAL | Status: DC
  Administered 2015-04-12: 18:00:00 0.4 mg via ORAL
  Filled 2015-04-12: qty 1

## 2015-04-12 MED ORDER — QUINAPRIL HCL 10 MG PO TABS
40.0000 mg | ORAL_TABLET | Freq: Every day | ORAL | Status: DC
  Filled 2015-04-12: qty 4

## 2015-04-12 MED ORDER — ACETAMINOPHEN 325 MG PO TABS: 650.0000 mg | ORAL_TABLET | ORAL | Status: DC | PRN

## 2015-04-12 MED ORDER — VERAPAMIL HCL 2.5 MG/ML IV SOLN
INTRAVENOUS | Status: AC
  Filled 2015-04-12: qty 2

## 2015-04-12 MED ORDER — ASPIRIN 81 MG PO CHEW: 81.0000 mg | CHEWABLE_TABLET | ORAL | Status: DC

## 2015-04-12 MED ORDER — DUTASTERIDE 0.5 MG PO CAPS
0.5000 mg | ORAL_CAPSULE | Freq: Every day | ORAL | Status: DC
Start: 2015-04-12 — End: 2015-04-13
  Administered 2015-04-12: 0.5 mg via ORAL
  Filled 2015-04-12 (×2): qty 1

## 2015-04-12 MED ORDER — ISOSORBIDE MONONITRATE ER 30 MG PO TB24: 30.0000 mg | ORAL_TABLET | Freq: Every day | ORAL | Status: DC

## 2015-04-12 MED ORDER — TIMOLOL MALEATE 0.5 % OP SOLN
1.0000 [drp] | Freq: Two times a day (BID) | OPHTHALMIC | Status: DC
  Administered 2015-04-12: 20:00:00 1 [drp] via OPHTHALMIC
  Filled 2015-04-12: qty 5

## 2015-04-12 MED ORDER — SODIUM CHLORIDE 0.9 % WEIGHT BASED INFUSION
1.0000 mL/kg/h | INTRAVENOUS | Status: DC
  Administered 2015-04-12: 1 mL/kg/h via INTRAVENOUS

## 2015-04-12 MED ORDER — CLOPIDOGREL BISULFATE 75 MG PO TABS
ORAL_TABLET | ORAL | Status: AC
  Administered 2015-04-12: 75 mg via ORAL
  Filled 2015-04-12: qty 1

## 2015-04-12 MED ORDER — ASPIRIN EC 81 MG PO TBEC: 81.0000 mg | DELAYED_RELEASE_TABLET | Freq: Every day | ORAL | Status: DC

## 2015-04-12 MED ORDER — HEPARIN SODIUM (PORCINE) 1000 UNIT/ML IJ SOLN
INTRAMUSCULAR | Status: DC | PRN
  Administered 2015-04-12: 4000 [IU] via INTRAVENOUS
  Administered 2015-04-12: 9000 [IU] via INTRAVENOUS
  Administered 2015-04-12: 4000 [IU] via INTRAVENOUS

## 2015-04-12 MED ORDER — OXYCODONE-ACETAMINOPHEN 5-325 MG PO TABS: 1.0000 | ORAL_TABLET | ORAL | Status: DC | PRN

## 2015-04-12 MED ORDER — CLOPIDOGREL BISULFATE 75 MG PO TABS
75.0000 mg | ORAL_TABLET | Freq: Once | ORAL | Status: AC
  Administered 2015-04-12: 75 mg via ORAL

## 2015-04-12 MED ORDER — ATORVASTATIN CALCIUM 80 MG PO TABS
80.0000 mg | ORAL_TABLET | Freq: Every day | ORAL | Status: DC
  Administered 2015-04-12: 10:00:00 80 mg via ORAL
  Filled 2015-04-12: qty 1

## 2015-04-12 MED ORDER — CLOPIDOGREL BISULFATE 75 MG PO TABS
75.0000 mg | ORAL_TABLET | Freq: Every day | ORAL | Status: DC
  Administered 2015-04-13: 75 mg via ORAL
  Filled 2015-04-12: qty 1

## 2015-04-12 MED ORDER — LIDOCAINE HCL (PF) 1 % IJ SOLN
INTRAMUSCULAR | Status: DC | PRN
  Administered 2015-04-12: 6 mL via INTRADERMAL

## 2015-04-12 MED ORDER — SODIUM CHLORIDE 0.9 % WEIGHT BASED INFUSION
3.0000 mL/kg/h | INTRAVENOUS | Status: AC
  Administered 2015-04-12: 3 mL/kg/h via INTRAVENOUS

## 2015-04-12 SURGICAL SUPPLY — 21 items
BALLN ANGIOSCULPT RX 2.5X10 (BALLOONS) ×3
BALLN ~~LOC~~ EMERGE MR 3.25X8 (BALLOONS) ×3
BALLOON ANGIOSCULPT RX 2.5X10 (BALLOONS) IMPLANT
BALLOON ~~LOC~~ EMERGE MR 3.25X8 (BALLOONS) IMPLANT
CATH INFINITI 5 FR AR1 MOD (CATHETERS) ×2 IMPLANT
CATH INFINITI 5FR AL1 (CATHETERS) ×2 IMPLANT
CATH INFINITI 5FR ANG PIGTAIL (CATHETERS) ×3 IMPLANT
CATH INFINITI 5FR JL4 (CATHETERS) ×2 IMPLANT
CATH INFINITI JR4 5F (CATHETERS) ×3 IMPLANT
CATH VISTA GUIDE 6FR XBLAD3.5 (CATHETERS) ×2 IMPLANT
DEVICE RAD COMP TR BAND LRG (VASCULAR PRODUCTS) ×3 IMPLANT
GLIDESHEATH SLEND SS 6F .021 (SHEATH) ×3 IMPLANT
KIT ENCORE 26 ADVANTAGE (KITS) ×2 IMPLANT
KIT HEART LEFT (KITS) ×3 IMPLANT
PACK CARDIAC CATHETERIZATION (CUSTOM PROCEDURE TRAY) ×3 IMPLANT
STENT RESOLUTE INTEG 3.0X12 (Permanent Stent) ×2 IMPLANT
SYR MEDRAD MARK V 150ML (SYRINGE) ×3 IMPLANT
TRANSDUCER W/STOPCOCK (MISCELLANEOUS) ×3 IMPLANT
TUBING CIL FLEX 10 FLL-RA (TUBING) ×3 IMPLANT
WIRE COUGAR XT STRL 190CM (WIRE) ×2 IMPLANT
WIRE SAFE-T 1.5MM-J .035X260CM (WIRE) ×3 IMPLANT

## 2015-04-12 NOTE — Progress Notes (Addendum)
TR BAND REMOVAL  LOCATION:  left radial  DEFLATED PER PROTOCOL:  Yes.    TIME BAND OFF / DRESSING APPLIED:   1530    SITE UPON ARRIVAL:   Level 1  SITE AFTER BAND REMOVAL:  Level 1  REVERSE ALLEN'S TEST:    positive  CIRCULATION SENSATION AND MOVEMENT:  Within Normal Limits  Yes.    COMMENTS: ooze, small hematoma proximal to band.

## 2015-04-12 NOTE — Interval H&P Note (Signed)
Cath Lab Visit (complete for each Cath Lab visit)  Clinical Evaluation Leading to the Procedure:   ACS: No.  Non-ACS:    Anginal Classification: CCS III  Anti-ischemic medical therapy: Maximal Therapy (2 or more classes of medications)  Non-Invasive Test Results: No non-invasive testing performed  Prior CABG: No previous CABG      History and Physical Interval Note:  04/12/2015 7:49 AM  Jerry Burns  has presented today for surgery, with the diagnosis of unstable angina  The various methods of treatment have been discussed with the patient and family. After consideration of risks, benefits and other options for treatment, the patient has consented to  Procedure(s): Left Heart Cath and Coronary Angiography (N/A) as a surgical intervention .  The patient's history has been reviewed, patient examined, no change in status, stable for surgery.  I have reviewed the patient's chart and labs.  Questions were answered to the patient's satisfaction.     Sherren Mocha

## 2015-04-12 NOTE — H&P (View-Only) (Signed)
Cardiology Office Note   Date:  04/08/2015   ID:  Jerry Burns, DOB [birth year], MRN [id number]  PCP:  Sallee Lange, MD  Cardiologist: Woodroe Chen, NP   Chief Complaint  Patient presents with  . Coronary Artery Disease  . Hypertension      History of Present Illness: Jerry Burns is a 81 y.o. male who presents for ongoing assessment and management of CAD, with DES to mid LAD 07/2014, and hypertension. He was last seen on 12/04/2014 with plans to have colonoscopy. He was advised to hold off on elective procedures for one year, and it was not recommended that he have the elective procedure until January of 2017.   He states that he is having worsening chest pain, with minimal activity. Walking less than 100 feet. 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 This has become concerning to him. He denies diaphoresis, but does state that he is more fatigued than usual lately.Hehas been medically compliant.   ______________________________________________________________________________________________________________________________________________ Left main: No obstructive disease.   Left Anterior Descending Artery: Large caliber vessel that courses to the apex. The proximal and mid vessel has diffuse 40% stenosis but no lesions that appear to be flow limiting. There is a focal 90% stenosis in the mid segment. The apical LAD has mild plaque disease. There are three diagonal branches. The first two are moderate in caliber and have mild plaque disease. The third diagonal branch is small in caliber with 60% ostial stenosis.   Circumflex Artery: Moderate to large caliber vessel with 30% proximal stenosis. The obtuse marginal branch is moderate in caliber with diffuse mild plaque.   Right Coronary Artery: Large caliber dominant vessel with diffuse 20% stenosis in the mid vessel. The PDA and posterolateral  branches are moderate in caliber. The PDa has mild plaque. The posterolateral branch has a 50% proximal stenosis.   Left Ventricular Angiogram: Deferred.   Impression: 1. Double vessel CAD 2. Severe stenosis mid LAD/unstable angina 3. Successful PTCA/DES x mid LAD 4. Moderate residual disease proximal and mid LAD and posterolateral branch of the RCA ______________________________________________________________________________________________________________________________________________ Past Medical History  Diagnosis Date  . Hyperlipidemia   . Stroke   . Arthritis     Bilateral knee  . Hypertension   . Coronary artery disease   . Anginal pain   . Diabetes mellitus     insulin dependent     Past Surgical History  Procedure Laterality Date  . Tonsillectomy    . Appendectomy    . Shoulder surgery    . Colonoscopy  2011    RMR: 1. Normal rectum 2. Left sided diverticula , 2 ascending colon polyps, status post snare polypectomy. Remainder of colonic mucosa appeared unremarkable.   . Coronary angioplasty with stent placement  08/22/2014    PTCA/DES X MID LAD    by Dr Julianne Handler  . Foot surgery Right   . Left heart catheterization with coronary angiogram N/A 08/22/2014    Procedure: LEFT HEART CATHETERIZATION WITH CORONARY ANGIOGRAM;  Surgeon: Burnell Blanks, MD;  Location: [facility] CATH LAB;  Service: Cardiovascular;  Laterality: N/A;  . Percutaneous coronary stent intervention (pci-s)  08/22/2014    Procedure: PERCUTANEOUS CORONARY STENT INTERVENTION (PCI-S);  Surgeon: Burnell Blanks, MD;      Current Outpatient Prescriptions  Medication Sig Dispense Refill  . amLODipine (NORVASC) 5 MG tablet TAKE 1 BY MOUTH DAILY 90 tablet 3  .  aspirin EC 81 MG tablet Take 81 mg by mouth daily.    Marland Kitchen atorvastatin (LIPITOR) 80 MG tablet Take 1 tablet (80 mg total) by mouth daily. 90 tablet 0  . brimonidine-timolol (COMBIGAN) 0.2-0.5 % ophthalmic solution Place 1 drop into the left  eye 2 (two) times daily.    . carvedilol (COREG) 6.25 MG tablet TAKE (1) TABLET BY MOUTH TWICE DAILY. 180 tablet 3  . clopidogrel (PLAVIX) 75 MG tablet Take 1 tablet (75 mg total) by mouth daily with breakfast. 30 tablet 11  . dutasteride (AVODART) 0.5 MG capsule TAKE 1 BY MOUTH DAILY 90 capsule 2  . glipiZIDE (GLUCOTROL) 5 MG tablet TAKE 1/2 TABLET BY MOUTH TWO TIMES DAILY BEFORE A MEAL. 30 tablet 5  . glucose blood (BAYER CONTOUR TEST) test strip USE TO TEST TWICE DAILY 100 each 1  . isosorbide mononitrate (IMDUR) 30 MG 24 hr tablet Take 1 tablet (30 mg total) by mouth daily. 90 tablet 3  . LANTUS SOLOSTAR 100 UNIT/ML Solostar Pen INJECT 31 TO 34 UNITS SUBCUTANEOUSLY AT BEDTIME 30 mL 1  . metFORMIN (GLUCOPHAGE) 1000 MG tablet TAKE 1 BY MOUTH TWICE DAILY WITH A MEAL 180 tablet 0  . mometasone (ELOCON) 0.1 % cream Apply to affected area daily 45 g 1  . Multiple Vitamin (MULTIVITAMIN) tablet Take 1 tablet by mouth daily.    . nitroGLYCERIN (NITROSTAT) 0.4 MG SL tablet Place 1 tablet (0.4 mg total) under the tongue every 5 (five) minutes as needed for chest pain. 90 tablet 3  . quinapril (ACCUPRIL) 40 MG tablet TAKE 1 BY MOUTH AT BEDTIME 90 tablet 0  . RELION MINI PEN NEEDLES 31G X 6 MM MISC USE AS DIRECTED 100 each 0  . sulfacetamide (BLEPH-10) 10 % ophthalmic solution INSTILL 4 DROPS INTO THE AFFECTED EYE(S) 4 TIMES DAILY FOR 5 DAYS. 15 mL 0  . tamsulosin (FLOMAX) 0.4 MG CAPS capsule TAKE 1 BY MOUTH DAILY 90 capsule 1   No current facility-administered medications for this visit.    Allergies:   Latex    Social History:  The patient  reports that he quit smoking about 7 years ago. His smoking use included Cigarettes and Cigars. He started smoking about 59 years ago. He has a 20 pack-year smoking history. He has never used smokeless tobacco. He reports that he drinks alcohol. He reports that he does not use illicit drugs.   Family History:  The patient's family history includes Colon cancer  in his paternal uncle; Diabetes in his brother and another family member.    ROS: All other systems are reviewed and negative. Unless otherwise mentioned in H&P    PHYSICAL EXAM: VS:  BP 126/72 mmHg  Pulse 67  Ht 5\' 11"  (1.803 m)  Wt 228 lb 9.6 oz (103.692 kg)  BMI 31.90 kg/m2  SpO2 96% , BMI Body mass index is 31.9 kg/(m^2). GEN: Well nourished, well developed, in no acute distress HEENT: normal Neck: no JVD, carotid bruits, or masses Cardiac: RRR; no murmurs, rubs, or gallops,no edema  Respiratory:  clear to auscultation bilaterally, normal work of breathing GI: soft, nontender, nondistended, + BS MS: no deformity or atrophy Skin: warm and dry, no rash Neuro:  Strength and sensation are intact Psych: euthymic mood, full affect   EKG:  The ekg ordered today demonstrates NSR with non-specific T wave abnormalities in the infero/lateral leads. Rate of 63.    Recent Labs: 08/23/2014: Hemoglobin 13.2; Platelets 184 02/27/2015: ALT 16; BUN 21; Creatinine, Ser  0.92; Sodium 139 03/06/2015: Potassium 4.8    Lipid Panel    Component Value Date/Time   CHOL 153 02/27/2015 0804   CHOL 155 07/31/2014 0703   TRIG 120 02/27/2015 0804   HDL 50 02/27/2015 0804   HDL 53 07/31/2014 0703   CHOLHDL 3.1 02/27/2015 0804   CHOLHDL 2.9 07/31/2014 0703   VLDL 19 07/31/2014 0703   LDLCALC 79 02/27/2015 0804   LDLCALC 83 07/31/2014 0703      Wt Readings from Last 3 Encounters:  04/08/15 228 lb 9.6 oz (103.692 kg)  03/06/15 222 lb 9.6 oz (100.971 kg)  12/04/14 222 lb (100.699 kg)      ASSESSMENT AND PLAN:  1. Angina: Reminiscent of previous anginal pain occuring with exertion and walking up incline. Known hx of CAD with most recent cath in January of 2016 with stent to the LAD. He had residual disease in the RCA, Cx and OM1. I have discussed having repeat catheterization with the patient who verbalizes understanding and is willing to proceed. I have discussed this with Dr. Johnsie Cancel on site  who agrees with my plan and assessment. Will plan cardiac cath on Friday first case with Dr. Burt Knack. He will continue al medications.   2. CAD: As discussed above. Continue statin, BB, Plavix, ASA, and ACE.   3. Hyperlipidemia: Follow up labs in 3 months. He is followed by Dr. Wolfgang Phoenix for this.  4. Diabetes: He is first case in am of Friday 04/12/2015. He knows to be NPO and hold insulin.    Current medicines are reviewed at length with the patient today.    Labs/ tests ordered today include: Cardiac cath with pre-cath labs.  Orders Placed This Encounter  Procedures  . EKG 12-Lead     Disposition:   FU with post cath  Signed, Jory Sims, NP  04/08/2015 3:37 PM    Jerry Burns [address] [place], [place] [address] Phone: [phone]; Fax: [phone]

## 2015-04-13 ENCOUNTER — Encounter (HOSPITAL_COMMUNITY): Payer: Self-pay | Admitting: Physician Assistant

## 2015-04-13 DIAGNOSIS — Z955 Presence of coronary angioplasty implant and graft: Secondary | ICD-10-CM | POA: Diagnosis not present

## 2015-04-13 DIAGNOSIS — R079 Chest pain, unspecified: Secondary | ICD-10-CM | POA: Diagnosis not present

## 2015-04-13 DIAGNOSIS — Z794 Long term (current) use of insulin: Secondary | ICD-10-CM | POA: Diagnosis not present

## 2015-04-13 DIAGNOSIS — I25110 Atherosclerotic heart disease of native coronary artery with unstable angina pectoris: Secondary | ICD-10-CM | POA: Diagnosis not present

## 2015-04-13 DIAGNOSIS — Z8673 Personal history of transient ischemic attack (TIA), and cerebral infarction without residual deficits: Secondary | ICD-10-CM | POA: Diagnosis not present

## 2015-04-13 DIAGNOSIS — I10 Essential (primary) hypertension: Secondary | ICD-10-CM | POA: Diagnosis not present

## 2015-04-13 DIAGNOSIS — E785 Hyperlipidemia, unspecified: Secondary | ICD-10-CM | POA: Diagnosis not present

## 2015-04-13 LAB — CBC
HCT: 36 % — ABNORMAL LOW (ref 39.0–52.0)
Hemoglobin: 12.2 g/dL — ABNORMAL LOW (ref 13.0–17.0)
MCH: 30.1 pg (ref 26.0–34.0)
MCHC: 33.9 g/dL (ref 30.0–36.0)
MCV: 88.9 fL (ref 78.0–100.0)
PLATELETS: 192 10*3/uL (ref 150–400)
RBC: 4.05 MIL/uL — AB (ref 4.22–5.81)
RDW: 12.7 % (ref 11.5–15.5)
WBC: 5 10*3/uL (ref 4.0–10.5)

## 2015-04-13 LAB — BASIC METABOLIC PANEL
Anion gap: 6 (ref 5–15)
BUN: 19 mg/dL (ref 6–20)
CHLORIDE: 103 mmol/L (ref 101–111)
CO2: 28 mmol/L (ref 22–32)
CREATININE: 0.98 mg/dL (ref 0.61–1.24)
Calcium: 9.2 mg/dL (ref 8.9–10.3)
GFR calc non Af Amer: >60 mL/min (ref >60)
GLUCOSE: 216 mg/dL — AB (ref 65–99)
Potassium: 4.2 mmol/L (ref 3.5–5.1)
Sodium: 137 mmol/L (ref 135–145)

## 2015-04-13 LAB — GLUCOSE, CAPILLARY: Glucose-Capillary: 241 mg/dL — ABNORMAL HIGH (ref 65–99)

## 2015-04-13 NOTE — Discharge Instructions (Signed)

## 2015-04-13 NOTE — Progress Notes (Signed)
[id number] Walked with NT about 400 ft without CP so did not walk. Education completed with pt who voiced understanding. Pt had a few diet questions and he has really been trying to do better with diet. Encouraged pt to attend CRP 2 for more help with diet as he did not attend previously when referred. Discussed benefits of doing if only for short time. He is in agreement to give it a try and agreed to referral to [place]. Stressed importance of plavix with stent. Graylon Good RN BSN 04/13/2015 9:24 AM

## 2015-04-13 NOTE — Discharge Summary (Signed)
CARDIOLOGY DISCHARGE SUMMARY   Patient ID: Jerry Burns MRN: [id number] DOB/AGE: 79-Oct-1935 79 y.o.  Admit date: 04/12/2015 Discharge date: 04/13/2015  PCP: Sallee Lange, MD Primary Cardiologist: Dr. Bronson Ing  Primary Discharge Diagnosis:  Exertional angina Secondary Discharge Diagnosis:  Active Problems:   Chest pain on exertion  Procedures: Cardiac catheterization, coronary arteriogram, left ventriculogram, PTCA and DES to the pLAD and PTCA with Angiosculpt scoring balloon to the [place] [facility] Course: DAVYN Burns is a 81 y.o. male with a history of DES mLAD 07/2014, HTN, , CVA, DM. He was seen in the office and was having exertional angina. He was scheduled for cardiac catheterization and came to the hospital for the procedure on 04/12/2015.  Cardiac catheterization results are below. He had significant stenosis in the proximal LAD that was treated with PTCA and a drug-eluting stent. In the distal LAD, he also had a focal in-stent restenosis mid stent. This was treated with PTCA and scoring balloon. Both stenoses reduced to 0. His EF is preserved. There was no other significant disease. He tolerated the procedure well.  On 04/13/2015, he was seen by Dr. Caryl Comes and by cardiac rehabilitation. His cath site had some ecchymosis but no hematoma. He was ambulating without chest pain or shortness of breath. No further in the usual workup was indicated and he is considered stable for discharge, to follow up in [place] as an outpatient.  BP 151/65 mmHg  Pulse 63  Temp(Src) 98.6 F (37 C) (Oral)  Resp 20  Ht 5' 11" (1.803 m)  Wt 206 lb 4.8 oz (93.577 kg)  BMI 28.79 kg/m2  SpO2 97% General: Well developed, well nourished, male in no acute distress Head: Eyes PERRLA, No xanthomas.   Normocephalic and atraumatic  Lungs: Clear bilaterally to auscultation. Heart: HRRR S1 S2, without MRG.  Pulses are 2+ & equal. No carotid bruit. No JVD. Abdomen: Bowel sounds are present,  abdomen soft and non-tender without masses or  hernias noted. Msk: Normal strength and tone for age. Extremities: No clubbing, cyanosis or edema. Left radial cath site with 2 x 4 cm area of ecchymosis, distal pulses intact, no bruit   Skin:  No rashes or lesions noted. Neuro: Alert and oriented X 3. Psych:  Good affect, responds appropriately  Labs:   Lab Results  Component Value Date   WBC 5.0 04/13/2015   HGB 12.2* 04/13/2015   HCT 36.0* 04/13/2015   MCV 88.9 04/13/2015   PLT 192 04/13/2015     Recent Labs Lab 04/13/15 0424  NA 137  K 4.2  CL 103  CO2 28  BUN 19  CREATININE 0.98  CALCIUM 9.2  GLUCOSE 216*    Recent Labs  04/12/15 0650  INR 1.11    Cardiac Cath: 04/12/2015 Dominance: Right   Left Anterior Descending   . Prox LAD lesion, 75% stenosed. discrete .   Marland Kitchen PCI: The pre-interventional distal flow is normal (TIMI 3). Pre-stent angioplasty was performed. A drug-eluting stent was placed. Post-stent angioplasty was performed. The post-interventional distal flow is normal (TIMI 3). The intervention was successful. No complications occurred at this lesion. An XB-LAD guide is used. A cougar wire is advanced into the distal vessel across both lesions. The proximal lesion is treated with a 2.5 mm Angiosculpt balloon followed by a 3.0x12 mm Resolute DES deployed at 14 atm. The stent is post-dilated with a 3.25 mm [place] balloon to 14 atm with 0% residual stenosis and no side-branch compromise.  Marland Kitchen  There is no residual stenosis post intervention.     . Dist LAD lesion, 80% stenosed. The lesion was previously treated with a drug-eluting stent six to twelve months ago. Focal ISR in mid-stent   . PCI: The pre-interventional distal flow is normal (TIMI 3). A stent was not placed. Maximum pressure: 12 atm. The post-interventional distal flow is normal (TIMI 3). The intervention was successful. No complications occurred at this lesion. There is 80% focal ISR in the mid portion of the  stent located in the distal LAD. The segment is treated with a 2.5x10 mm Angiosculpt scoring balloon on 2 inflations to 12 atm.  . There is no residual stenosis post intervention.       Left Circumflex  The vessel exhibits minimal luminal irregularities.     Right Coronary Artery  There is mild the vessel.     EKG: 04/13/2015 Sinus rhythm, no acute ischemic changes Vent. rate 67 BPM PR interval 214 ms QRS duration 118 ms QT/QTc 390/412 ms P-R-T axes 74 -62 64   FOLLOW UP PLANS AND APPOINTMENTS Allergies  Allergen Reactions  . Latex Other (See Comments)    bandaids cause blisters after 24 hours      Medication List    STOP taking these medications        isosorbide mononitrate 30 MG 24 hr tablet  Commonly known as:  IMDUR      TAKE these medications        amLODipine 5 MG tablet  Commonly known as:  NORVASC  TAKE 1 BY MOUTH DAILY     aspirin EC 81 MG tablet  Take 81 mg by mouth daily.     atorvastatin 80 MG tablet  Commonly known as:  LIPITOR  Take 1 tablet (80 mg total) by mouth daily.     brimonidine-timolol 0.2-0.5 % ophthalmic solution  Commonly known as:  COMBIGAN  Place 1 drop into the left eye 2 (two) times daily.     carvedilol 6.25 MG tablet  Commonly known as:  COREG  TAKE (1) TABLET BY MOUTH TWICE DAILY.     clopidogrel 75 MG tablet  Commonly known as:  PLAVIX  Take 1 tablet (75 mg total) by mouth daily with breakfast.     dutasteride 0.5 MG capsule  Commonly known as:  AVODART  TAKE 1 BY MOUTH DAILY     glipiZIDE 5 MG tablet  Commonly known as:  GLUCOTROL  TAKE 1/2 TABLET BY MOUTH TWO TIMES DAILY BEFORE A MEAL.     glucose blood test strip  Commonly known as:  BAYER CONTOUR TEST  USE TO TEST TWICE DAILY     LANTUS SOLOSTAR 100 UNIT/ML Solostar Pen  Generic drug:  Insulin Glargine  INJECT 31 TO 34 UNITS SUBCUTANEOUSLY AT BEDTIME     metFORMIN 1000 MG tablet  Commonly known as:  GLUCOPHAGE  TAKE 1 BY MOUTH TWICE DAILY WITH A  MEAL  Notes to Patient:  HOLD for 48 hours, resume on 04/15/2015     mometasone 0.1 % cream  Commonly known as:  ELOCON  Apply to affected area daily     multivitamin tablet  Take 1 tablet by mouth daily.     nitroGLYCERIN 0.4 MG SL tablet  Commonly known as:  NITROSTAT  Place 1 tablet (0.4 mg total) under the tongue every 5 (five) minutes as needed for chest pain.     quinapril 40 MG tablet  Commonly known as:  ACCUPRIL  TAKE 1 BY MOUTH   AT BEDTIME     RELION MINI PEN NEEDLES 31G X 6 MM Misc  Generic drug:  Insulin Pen Needle  USE AS DIRECTED     sulfacetamide 10 % ophthalmic solution  Commonly known as:  BLEPH-10  INSTILL 4 DROPS INTO THE AFFECTED EYE(S) 4 TIMES DAILY FOR 5 DAYS.     tamsulosin 0.4 MG Caps capsule  Commonly known as:  FLOMAX  TAKE 1 BY MOUTH DAILY        Discharge Instructions    Diet - low sodium heart healthy    Complete by:  As directed      Diet Carb Modified    Complete by:  As directed      Increase activity slowly    Complete by:  As directed             BRING ALL MEDICATIONS WITH YOU TO FOLLOW UP APPOINTMENTS  Time spent with patient to include physician time: 43 min Signed: Barrett, Rhonda, PA-C 04/13/2015, 8:19 AM Co-Sign MD  insturctions reviewed Weight loss encouraged   

## 2015-04-17 ENCOUNTER — Telehealth: Payer: Self-pay | Admitting: Family Medicine

## 2015-04-17 NOTE — Telephone Encounter (Signed)
Pt dropped off a form to be filled out for the pt to get a new glucose meter. Pt will need this faxed to [facility] in [place] when finished.

## 2015-04-18 NOTE — Telephone Encounter (Signed)
I will be happy to do please complete that are well sign and we can send

## 2015-04-19 NOTE — Telephone Encounter (Signed)
Form filled out and faxed to [facility] in [place].

## 2015-04-22 ENCOUNTER — Other Ambulatory Visit: Payer: Self-pay | Admitting: Family Medicine

## 2015-04-25 ENCOUNTER — Other Ambulatory Visit: Payer: Self-pay | Admitting: Family Medicine

## 2015-04-26 ENCOUNTER — Encounter: Payer: Self-pay | Admitting: Adult Health

## 2015-04-26 ENCOUNTER — Ambulatory Visit (INDEPENDENT_AMBULATORY_CARE_PROVIDER_SITE_OTHER): Payer: Medicare Other | Admitting: Adult Health

## 2015-04-26 VITALS — BP 138/68 | HR 63 | Ht 71.0 in | Wt 221.4 lb

## 2015-04-26 DIAGNOSIS — E7800 Pure hypercholesterolemia, unspecified: Secondary | ICD-10-CM

## 2015-04-26 DIAGNOSIS — I2510 Atherosclerotic heart disease of native coronary artery without angina pectoris: Secondary | ICD-10-CM

## 2015-04-26 DIAGNOSIS — I10 Essential (primary) hypertension: Secondary | ICD-10-CM | POA: Diagnosis not present

## 2015-04-26 NOTE — Progress Notes (Signed)
Name: Jerry Burns    DOB: [birth year]  Age: 81 y.o.  MR#: [id number]       PCP:  Sallee Lange, MD      Insurance: Payor: MEDICARE / Plan: MEDICARE PART A AND B / Product Type: *No Product type* /   CC:   No chief complaint on file.   VS Filed Vitals:   04/26/15 1404  BP: 138/68  Pulse: 63  Height: 5\' 11"  (1.803 m)  Weight: 221 lb 6.4 oz (100.426 kg)  SpO2: 95%    Weights Current Weight  04/26/15 221 lb 6.4 oz (100.426 kg)  04/13/15 206 lb 4.8 oz (93.577 kg)  04/08/15 228 lb 9.6 oz (103.692 kg)    Blood Pressure  BP Readings from Last 3 Encounters:  04/26/15 138/68  04/13/15 151/65  04/08/15 126/72     Admit date:  (Not on file) Last encounter with RMR:  04/08/2015   Allergy Latex  Current Outpatient Prescriptions  Medication Sig Dispense Refill  . amLODipine (NORVASC) 5 MG tablet TAKE 1 BY MOUTH DAILY 90 tablet 3  . aspirin EC 81 MG tablet Take 81 mg by mouth daily.    Marland Kitchen atorvastatin (LIPITOR) 80 MG tablet Take 1 tablet (80 mg total) by mouth daily. 90 tablet 0  . carvedilol (COREG) 6.25 MG tablet TAKE (1) TABLET BY MOUTH TWICE DAILY. 180 tablet 3  . clopidogrel (PLAVIX) 75 MG tablet Take 1 tablet (75 mg total) by mouth daily with breakfast. 30 tablet 11  . dutasteride (AVODART) 0.5 MG capsule TAKE 1 BY MOUTH DAILY 90 capsule 1  . glipiZIDE (GLUCOTROL) 5 MG tablet TAKE 1/2 TABLET BY MOUTH TWO TIMES DAILY BEFORE A MEAL. 30 tablet 5  . glucose blood (BAYER CONTOUR TEST) test strip USE TO TEST TWICE DAILY 100 each 1  . LANTUS SOLOSTAR 100 UNIT/ML Solostar Pen INJECT 31 TO 34 UNITS SUBCUTANEOUSLY AT BEDTIME 30 mL 1  . metFORMIN (GLUCOPHAGE) 1000 MG tablet TAKE 1 BY MOUTH TWICE DAILY WITH MEALS 180 tablet 0  . mometasone (ELOCON) 0.1 % cream Apply to affected area daily 45 g 1  . Multiple Vitamin (MULTIVITAMIN) tablet Take 1 tablet by mouth daily.    . nitroGLYCERIN (NITROSTAT) 0.4 MG SL tablet Place 1 tablet (0.4 mg total) under the tongue every 5 (five) minutes as  needed for chest pain. 90 tablet 3  . quinapril (ACCUPRIL) 40 MG tablet TAKE 1 BY MOUTH AT BEDTIME 90 tablet 0  . RELION MINI PEN NEEDLES 31G X 6 MM MISC USE AS DIRECTED 100 each 0  . tamsulosin (FLOMAX) 0.4 MG CAPS capsule TAKE 1 BY MOUTH DAILY 90 capsule 1   No current facility-administered medications for this visit.    Discontinued Meds:    Medications Discontinued During This Encounter  Medication Reason  . sulfacetamide (BLEPH-10) 10 % ophthalmic solution Error  . brimonidine-timolol (COMBIGAN) 0.2-0.5 % ophthalmic solution Error    Patient Active Problem List   Diagnosis Date Noted  . Exertional angina 04/12/2015  . History of colonic polyps 11/21/2014  . ASCVD (arteriosclerotic cardiovascular disease) 09/03/2014  . Unstable angina 08/22/2014  . Diabetes type 2, controlled 08/08/2014  . Chest pain on exertion 07/23/2014  . Rotator cuff syndrome of right shoulder 12/16/2011  . Shoulder pain, right 12/16/2011  . HYPERKALEMIA 01/31/2009  . KNEE PAIN, LEFT 07/31/2008  . DIABETIC FOOT ULCER, TOE 12/23/2007  . BENIGN PROSTATIC HYPERTROPHY 01/26/2007  . URINARY TRACT INFECTION 01/12/2007  . URINARY RETENTION 01/12/2007  .  ERECTILE DYSFUNCTION 11/01/2006  . Hyperlipidemia 08/04/2006  . Essential hypertension 08/04/2006  . OSTEOARTHRITIS 08/04/2006    LABS    Component Value Date/Time   NA 137 04/13/2015 0424   NA 138 04/12/2015 0650   NA 139 02/27/2015 0804   NA 138 11/02/2014 0806   NA 139 09/28/2014 0815   NA 137 08/23/2014 0525   K 4.2 04/13/2015 0424   K 4.4 04/12/2015 0650   K 4.8 03/06/2015 1016   CL 103 04/13/2015 0424   CL 106 04/12/2015 0650   CL 101 02/27/2015 0804   CO2 28 04/13/2015 0424   CO2 24 04/12/2015 0650   CO2 26 02/27/2015 0804   GLUCOSE 216* 04/13/2015 0424   GLUCOSE 201* 04/12/2015 0650   GLUCOSE 193* 02/27/2015 0804   GLUCOSE 198* 11/02/2014 0806   GLUCOSE 167* 09/28/2014 0815   GLUCOSE 172* 08/23/2014 0525   BUN 19 04/13/2015 0424    BUN 28* 04/12/2015 0650   BUN 21 02/27/2015 0804   BUN 28* 11/02/2014 0806   BUN 25 09/28/2014 0815   BUN 14 08/23/2014 0525   CREATININE 0.98 04/13/2015 0424   CREATININE 0.97 04/12/2015 0650   CREATININE 0.92 02/27/2015 0804   CREATININE 0.93 04/23/2014 0704   CREATININE 0.89 10/26/2013 0712   CREATININE 1.01 12/21/2012 0720   CALCIUM 9.2 04/13/2015 0424   CALCIUM 9.2 04/12/2015 0650   CALCIUM 9.8 02/27/2015 0804   GFRNONAA >60 04/13/2015 0424   GFRNONAA >60 04/12/2015 0650   GFRNONAA 79 02/27/2015 0804   GFRAA >60 04/13/2015 0424   GFRAA >60 04/12/2015 0650   GFRAA 91 02/27/2015 0804   CMP     Component Value Date/Time   NA 137 04/13/2015 0424   NA 139 02/27/2015 0804   K 4.2 04/13/2015 0424   CL 103 04/13/2015 0424   CO2 28 04/13/2015 0424   GLUCOSE 216* 04/13/2015 0424   GLUCOSE 193* 02/27/2015 0804   BUN 19 04/13/2015 0424   BUN 21 02/27/2015 0804   CREATININE 0.98 04/13/2015 0424   CREATININE 0.93 04/23/2014 0704   CALCIUM 9.2 04/13/2015 0424   PROT 6.8 02/27/2015 0804   PROT 6.4 04/23/2014 0704   ALBUMIN 4.0 04/23/2014 0704   AST 17 02/27/2015 0804   ALT 16 02/27/2015 0804   ALKPHOS 77 02/27/2015 0804   BILITOT 0.5 02/27/2015 0804   BILITOT 0.5 04/23/2014 0704   GFRNONAA >60 04/13/2015 0424   GFRAA >60 04/13/2015 0424       Component Value Date/Time   WBC 5.0 04/13/2015 0424   WBC 4.5 04/12/2015 0650   WBC 5.3 08/23/2014 0525   HGB 12.2* 04/13/2015 0424   HGB 11.5* 04/12/2015 0650   HGB 13.2 08/23/2014 0525   HCT 36.0* 04/13/2015 0424   HCT 33.8* 04/12/2015 0650   HCT 38.8* 08/23/2014 0525   MCV 88.9 04/13/2015 0424   MCV 88.7 04/12/2015 0650   MCV 88.4 08/23/2014 0525    Lipid Panel     Component Value Date/Time   CHOL 153 02/27/2015 0804   CHOL 155 07/31/2014 0703   TRIG 120 02/27/2015 0804   HDL 50 02/27/2015 0804   HDL 53 07/31/2014 0703   CHOLHDL 3.1 02/27/2015 0804   CHOLHDL 2.9 07/31/2014 0703   VLDL 19 07/31/2014 0703    LDLCALC 79 02/27/2015 0804   LDLCALC 83 07/31/2014 0703    ABG No results found for: PHART, PCO2ART, PO2ART, HCO3, TCO2, ACIDBASEDEF, O2SAT   No results found for: TSH BNP (last 3  results) No results for input(s): BNP in the last 8760 hours.  ProBNP (last 3 results) No results for input(s): PROBNP in the last 8760 hours.  Cardiac Panel (last 3 results) No results for input(s): CKTOTAL, CKMB, TROPONINI, RELINDX in the last 72 hours.  Iron/TIBC/Ferritin/ %Sat No results found for: IRON, TIBC, FERRITIN, IRONPCTSAT   EKG Orders placed or performed during the hospital encounter of 04/12/15  . EKG 12-Lead  . EKG 12-Lead  . EKG 12-Lead immediately post procedure  . EKG 12-Lead  . EKG 12-Lead immediately post procedure  . EKG 12-Lead  . EKG 12-Lead  . EKG 12-Lead  . EKG     Prior Assessment and Plan Problem List as of 04/26/2015      Cardiovascular and Mediastinum   Essential hypertension   Last Assessment & Plan 08/09/2014 Office Visit Written 08/09/2014  2:23 PM by Lendon Colonel, NP    Currently well-controlled.  Is medically compliant.  No changes in medication regimen.  Await cardiac catheterization results for any adjustments, which may be necessary.      Unstable angina   ASCVD (arteriosclerotic cardiovascular disease)   Exertional angina     Endocrine   DIABETIC FOOT ULCER, TOE   Diabetes type 2, controlled     Musculoskeletal and Integument   OSTEOARTHRITIS   Rotator cuff syndrome of right shoulder     Genitourinary   URINARY TRACT INFECTION   BENIGN PROSTATIC HYPERTROPHY   URINARY RETENTION     Other   Hyperlipidemia   HYPERKALEMIA   ERECTILE DYSFUNCTION   KNEE PAIN, LEFT   Shoulder pain, right   Chest pain on exertion   Last Assessment & Plan 08/09/2014 Office Visit Written 08/09/2014  2:24 PM by Lendon Colonel, NP    As stated, he had a high-risk abnormal nuclear medicine stress test read by Dr. Harl Bowie on 1/ 7/ 2016. As a result of these  findings.  I discussed with the patient and Dr. Domenic Polite, who is in agreement, but the patient will need to have a cardiac catheterization and definitive evaluation of coronary anatomy.  I discussed the risks and benefits, possibility of intervention if necessary.  He verbalizes understanding and is willing to proceed.  All questions and concerns were answered.  His plan to have a cardiac catheterization on Monday, January 18th, 2016, with Dr. Martinique.  He is provided precardiac catheterization instructions, and all questions have been answered.      History of colonic polyps   Last Assessment & Plan 11/21/2014 Office Visit Written 11/21/2014 11:43 AM by Carlis Stable, NP    Repeat surveillance colonoscopy. Last colonoscopy was 5 years ago with diffuse tubular adenoma polyps removed. Recommended repeat in 5 years. In that time frame however the patient has had a cardiac catheterization with a drug-eluting stent placement approximately 3 months ago and is currently on Plavix. Also admits some residual morning chest pain. He is due to be seen by cardiology in approximately 2-3 weeks. Given his current Plavix anticoagulation and potential residual chest pain with atherosclerotic coronary disease we will hold off on colonoscopy right now until he can be seen and cleared by cardiology. Additionally, per recommendations, Plavix medication should not be held for a minimum of 6 months after DES placement. I will consult with Dr. Gala Romney to find out when he is most comfortable with proceeding with a surveillance colonoscopy with possible polypectomy on Plavix, and notify the patient. Follow-up appointment tentatively scheduled for 3 months.  Imaging: No results found.

## 2015-04-26 NOTE — Progress Notes (Signed)
Cardiology Office Note   Date:  04/26/2015   ID:  Jerry Burns, DOB March 27, 1934, MRN [id number]  PCP:  Sallee Lange, MD  Cardiologist: Woodroe Chen, NP   Chief Complaint  Patient presents with  . Coronary Artery Disease  . Hyperlipidemia      History of Present Illness: Jerry Burns is a 81 y.o. male who presents for ongoing assessment and management of coronary artery disease, with history of drug-eluting stent to the mid LAD in January 2016, and hypertension.  The patient was last seen in the office on 04/08/2015, with recurrent symptoms, worrisome for unstable angina.he was scheduled for a cardiac catheterization.  Cardiac catheterization was completed on 04/12/2015:  Single vessel CAD involving the proximal LAD and distal LAD with severe in-stent restenosis in that segment.   Minor nonobstructive disease of the RCA and LCx  Successful PCI of the LAD using a DES in the proximal vessel and scoring balloon angioplasty in the distal vessel  Normal LV systolic function  He was recommended for another year of dual antiplatelet therapy.he was advised to participate in cardiac rehabilitation.  He is here for post hospitalization followup.  He is 81, complaints.  He states, he feels a lot better, and is able to walk long distances, again, without any dyspnea or chest discomfort.  He does have some bruising on his arms, but is willing to put up with found with the use of the dual antiplatelet therapy.  He is being medically compliant.  He is willing to participate in cardiac rehabilitation.  He is grateful to the cardiology group providers and nurses for all of the work that they had done to improve his cardiac health . Past Medical History  Diagnosis Date  . Hyperlipidemia   . Stroke   . Arthritis     Bilateral knee  . Hypertension   . Coronary artery disease   . Anginal pain   . Diabetes mellitus     insulin dependent   . Progressive angina 03/2015   3.0x12 mm Resolute DES to pLAD, Angiosculpt scoring balloon to dLAD    Past Surgical History  Procedure Laterality Date  . Tonsillectomy    . Appendectomy    . Shoulder surgery    . Colonoscopy  2011    RMR: 1. Normal rectum 2. Left sided diverticula , 2 ascending colon polyps, status post snare polypectomy. Remainder of colonic mucosa appeared unremarkable.   . Coronary angioplasty with stent placement  08/22/2014    PTCA/DES X mLAD with 2.5 x 16 mm Promus Premier DES by Dr Julianne Handler  . Foot surgery Right   . Left heart catheterization with coronary angiogram N/A 08/22/2014    Procedure: LEFT HEART CATHETERIZATION WITH CORONARY ANGIOGRAM;  Surgeon: Burnell Blanks, MD;  Location: [facility] CATH LAB;  Service: Cardiovascular;  Laterality: N/A;  . Percutaneous coronary stent intervention (pci-s)  08/22/2014    Procedure: PERCUTANEOUS CORONARY STENT INTERVENTION (PCI-S);  Surgeon: Burnell Blanks, MD;   . Cardiac catheterization N/A 04/12/2015    Procedure: Left Heart Cath and Coronary Angiography;  Surgeon: Sherren Mocha, MD; pLAD 75>0% w/ 3.0x12 mm Resolute DES, dLAD 80>0% w/ Angiosculpt scoring balloon, CFX luminal irreg, RCA mild dz, EF 55-65%      Current Outpatient Prescriptions  Medication Sig Dispense Refill  . amLODipine (NORVASC) 5 MG tablet TAKE 1 BY MOUTH DAILY 90 tablet 3  . aspirin EC 81 MG tablet Take 81 mg by mouth daily.    Marland Kitchen  atorvastatin (LIPITOR) 80 MG tablet Take 1 tablet (80 mg total) by mouth daily. 90 tablet 0  . carvedilol (COREG) 6.25 MG tablet TAKE (1) TABLET BY MOUTH TWICE DAILY. 180 tablet 3  . clopidogrel (PLAVIX) 75 MG tablet Take 1 tablet (75 mg total) by mouth daily with breakfast. 30 tablet 11  . dutasteride (AVODART) 0.5 MG capsule TAKE 1 BY MOUTH DAILY 90 capsule 1  . glipiZIDE (GLUCOTROL) 5 MG tablet TAKE 1/2 TABLET BY MOUTH TWO TIMES DAILY BEFORE A MEAL. 30 tablet 5  . glucose blood (BAYER CONTOUR TEST) test strip USE TO TEST TWICE DAILY 100 each  1  . LANTUS SOLOSTAR 100 UNIT/ML Solostar Pen INJECT 31 TO 34 UNITS SUBCUTANEOUSLY AT BEDTIME 30 mL 1  . metFORMIN (GLUCOPHAGE) 1000 MG tablet TAKE 1 BY MOUTH TWICE DAILY WITH MEALS 180 tablet 0  . mometasone (ELOCON) 0.1 % cream Apply to affected area daily 45 g 1  . Multiple Vitamin (MULTIVITAMIN) tablet Take 1 tablet by mouth daily.    . nitroGLYCERIN (NITROSTAT) 0.4 MG SL tablet Place 1 tablet (0.4 mg total) under the tongue every 5 (five) minutes as needed for chest pain. 90 tablet 3  . quinapril (ACCUPRIL) 40 MG tablet TAKE 1 BY MOUTH AT BEDTIME 90 tablet 0  . RELION MINI PEN NEEDLES 31G X 6 MM MISC USE AS DIRECTED 100 each 0  . tamsulosin (FLOMAX) 0.4 MG CAPS capsule TAKE 1 BY MOUTH DAILY 90 capsule 1   No current facility-administered medications for this visit.    Allergies:   Latex    Social History:  The patient  reports that he quit smoking about 7 years ago. His smoking use included Cigarettes and Cigars. He started smoking about 59 years ago. He has a 20 pack-year smoking history. He has never used smokeless tobacco. He reports that he drinks alcohol. He reports that he does not use illicit drugs.   Family History:  The patient's family history includes Colon cancer in his paternal uncle; Diabetes in his brother and another family member.    ROS: All other systems are reviewed and negative. Unless otherwise mentioned in H&P    PHYSICAL EXAM: VS:  BP 138/68 mmHg  Pulse 63  Ht 5\' 11"  (1.803 m)  Wt 221 lb 6.4 oz (100.426 kg)  BMI 30.89 kg/m2  SpO2 95% , BMI Body mass index is 30.89 kg/(m^2). GEN: Well nourished, well developed, in no acute distress HEENT: normal Neck: no JVD, carotid bruits, or masses Cardiac: RRR; no murmurs, rubs, or gallops,no edema  Respiratory:  clear to auscultation bilaterally, normal work of breathing GI: soft, nontender, nondistended, + BS MS: no deformity or atrophycatheterization insertion siteof the left wrist is well healed Skin: warm and  dry, no rash Neuro:  Strength and sensation are intact Psych: euthymic mood, full affect   Recent Labs: 02/27/2015: ALT 16 04/13/2015: BUN 19; Creatinine, Ser 0.98; Hemoglobin 12.2*; Platelets 192; Potassium 4.2; Sodium 137    Lipid Panel    Component Value Date/Time   CHOL 153 02/27/2015 0804   CHOL 155 07/31/2014 0703   TRIG 120 02/27/2015 0804   HDL 50 02/27/2015 0804   HDL 53 07/31/2014 0703   CHOLHDL 3.1 02/27/2015 0804   CHOLHDL 2.9 07/31/2014 0703   VLDL 19 07/31/2014 0703   LDLCALC 79 02/27/2015 0804   LDLCALC 83 07/31/2014 0703      Wt Readings from Last 3 Encounters:  04/26/15 221 lb 6.4 oz (100.426 kg)  04/13/15 206  lb 4.8 oz (93.577 kg)  04/08/15 228 lb 9.6 oz (103.692 kg)      ASSESSMENT AND PLAN:  1. CAD: Status post cardiac catheterization revealing 75% proximal LAD and 80% distal LAD requiring PCI.  Patient also had in-stent restenosis of existing DES.  He will continue on dual antiplatelet therapy indefinitely.  He is encouraged to go to cardiac rehabilitation and is willing to do so.  I will continue him on beta blocker, aspirin, Plavix, ACE inhibitor, and statin therapy.  We will see him again in 6 months unless he is symptomatic.  2. Hypertension:blood pressure is actually improved after cardiac intervention.  I will continue him on above medications, along with amlodipine, 5 mg daily.  He will continue to see his primary care physician, [place] for ongoing medical management of diabetes.  3. Hypercholesterolemia:he will continue statin therapy, increase his exercise, and avoid high cholesterol foods.  He will have reinforcement of  dietary education during cardiac rehabilitation.   Current medicines are reviewed at length with the patient today.    Labs/ tests ordered today include: none No orders of the defined types were placed in this encounter.     Disposition:   FU with 6 months.  Signed, Jory Sims, NP  04/26/2015 2:23 PM    [facility] [address], [place], [place] [address] Phone: [phone]; Fax: [phone]

## 2015-04-26 NOTE — Patient Instructions (Signed)
Your physician wants you to follow-up in: 6 months with Kathryn Lawrence, NP. You will receive a reminder letter in the mail two months in advance. If you don't receive a letter, please call our office to schedule the follow-up appointment.  Your physician recommends that you continue on your current medications as directed. Please refer to the Current Medication list given to you today.  Thank you for choosing  [facility]!   

## 2015-05-07 ENCOUNTER — Encounter (HOSPITAL_COMMUNITY)
Admission: RE | Admit: 2015-05-07 | Discharge: 2015-05-07 | Disposition: A | Discharge status: Home / Self Care | Payer: Medicare Other | Source: Ambulatory Visit | Attending: Cardiovascular Disease | Admitting: Cardiovascular Disease

## 2015-05-07 VITALS — BP 120/60 | HR 60 | Ht 71.0 in | Wt 223.6 lb

## 2015-05-07 DIAGNOSIS — Z955 Presence of coronary angioplasty implant and graft: Secondary | ICD-10-CM | POA: Insufficient documentation

## 2015-05-07 DIAGNOSIS — Z9861 Coronary angioplasty status: Secondary | ICD-10-CM

## 2015-05-07 DIAGNOSIS — I2510 Atherosclerotic heart disease of native coronary artery without angina pectoris: Secondary | ICD-10-CM | POA: Insufficient documentation

## 2015-05-07 NOTE — Progress Notes (Signed)
Cardiac/Pulmonary Rehab Medication Review by a Pharmacist  Does the patient  feel that his/her medications are working for him/her?  yes  Has the patient been experiencing any side effects to the medications prescribed?  yes  Does the patient measure his/her own blood pressure or blood glucose at home?  yes   Does the patient have any problems obtaining medications due to transportation or finances?   no  Understanding of regimen: excellent Understanding of indications: excellent Potential of compliance: excellent  Questions asked to Determine Patient Understanding of Medication Regimen:  1. What is the name of the medication?  2. What is the medication used for?  3. When should it be taken?  4. How much should be taken?  5. How will you take it?  6. What side effects should you report?  Understanding Defined as: Excellent: All questions above are correct Good: Questions 1-4 are correct Fair: Questions 1-2 are correct  Poor: 1 or none of the above questions are correct   Pharmacist comments: Pt compliant with medications and understands all of them.  Pt has noticed easy bruising and bleeding related to Plavix but is under control .  Monitor blood sugar twice daily and blood pressure as needed. Continue current regimen.  Isac Sarna, BS Pharm D, [place] Clinical Pharmacist Pager [phone]  05/07/2015 8:36 AM

## 2015-05-07 NOTE — Progress Notes (Signed)
Patient arrived for 1st visit/orientation/education at 0800. Patient was referred to CR by Dr. Bronson Ing due to Coronary Stent Placement Z95.5. During orientation advised patient on arrival and appointment times what to wear, what to do before, during and after exercise. Reviewed attendance and class policy. Talked about inclement weather and class consultation policy. Pt is scheduled to return Cardiac Rehab on 05/13/15. Pt was advised to come to class 5 minutes before class starts. He was also given instructions on meeting with the dietician and attending the Family Structure classes. Pt is eager to get started. Patient was able to complete 6 minute walk test. Patient was measured for the equipment. Discussed equipment safety with patient. Took patient pre-anthropometric measurements. Patient had a score of 0 on his PHQ-2. Patient does not need to be referred to health professional for depression. Patient finished visit at 1050.

## 2015-05-08 NOTE — Patient Instructions (Signed)
Orientation completed. Pt is scheduled to return on Monday 05/13/15 at [facility]. Pt is registered and records are requested. Pt is eager to get started.

## 2015-05-11 ENCOUNTER — Other Ambulatory Visit: Payer: Self-pay | Admitting: Family Medicine

## 2015-05-13 ENCOUNTER — Other Ambulatory Visit: Payer: Self-pay | Admitting: Family Medicine

## 2015-05-13 ENCOUNTER — Encounter (HOSPITAL_COMMUNITY)
Admission: RE | Admit: 2015-05-13 | Discharge: 2015-05-13 | Disposition: A | Discharge status: Home / Self Care | Payer: Medicare Other | Source: Ambulatory Visit | Attending: Cardiovascular Disease | Admitting: Cardiovascular Disease

## 2015-05-13 DIAGNOSIS — Z955 Presence of coronary angioplasty implant and graft: Secondary | ICD-10-CM | POA: Diagnosis not present

## 2015-05-13 DIAGNOSIS — I2510 Atherosclerotic heart disease of native coronary artery without angina pectoris: Secondary | ICD-10-CM | POA: Diagnosis not present

## 2015-05-15 ENCOUNTER — Encounter (HOSPITAL_COMMUNITY)
Admission: RE | Admit: 2015-05-15 | Discharge: 2015-05-15 | Disposition: A | Discharge status: Home / Self Care | Payer: Medicare Other | Source: Ambulatory Visit | Attending: Cardiovascular Disease | Admitting: Cardiovascular Disease

## 2015-05-15 DIAGNOSIS — Z955 Presence of coronary angioplasty implant and graft: Secondary | ICD-10-CM | POA: Diagnosis not present

## 2015-05-15 DIAGNOSIS — I2510 Atherosclerotic heart disease of native coronary artery without angina pectoris: Secondary | ICD-10-CM | POA: Diagnosis not present

## 2015-05-17 ENCOUNTER — Encounter (HOSPITAL_COMMUNITY)
Admission: RE | Admit: 2015-05-17 | Discharge: 2015-05-17 | Disposition: A | Discharge status: Home / Self Care | Payer: Medicare Other | Source: Ambulatory Visit | Attending: Cardiovascular Disease | Admitting: Cardiovascular Disease

## 2015-05-17 DIAGNOSIS — I2510 Atherosclerotic heart disease of native coronary artery without angina pectoris: Secondary | ICD-10-CM | POA: Diagnosis not present

## 2015-05-17 DIAGNOSIS — Z955 Presence of coronary angioplasty implant and graft: Secondary | ICD-10-CM | POA: Diagnosis not present

## 2015-05-20 ENCOUNTER — Encounter (HOSPITAL_COMMUNITY): Payer: Medicare Other

## 2015-05-21 NOTE — Progress Notes (Signed)
Cardiac Rehabilitation Program Outcomes Report   Orientation:  05/07/15  Graduate Date:  tbd Discharge Date:  tbd # of sessions completed: 3  Cardiologist: Bronson Ing Family MD:  Wolfgang Phoenix Class Time:  0815  A.  Exercise Program:  Tolerates exercise @ 3.67 METS for 15 minutes and Walk Test Results:  Pre: 2.96 mets  B.  Mental Health:  Good mental attitude and PHQ-9: 0  C.  Education/Instruction/Skills  Accurately checks own pulse.  Rest:  68  Exercise:  92  Uses Perceived Exertion Scale and/or Dyspnea Scale  D.  Nutrition/Weight Control/Body Composition:  Adherence to prescribed nutrition program: fair    E.  Blood Lipids    Lab Results  Component Value Date   CHOL 153 02/27/2015   HDL 50 02/27/2015   LDLCALC 79 02/27/2015   TRIG 120 02/27/2015   CHOLHDL 3.1 02/27/2015    F.  Lifestyle Changes:  Making positive lifestyle changes and Not smoking:  Quit 1963  G.  Symptoms noted with exercise:  Asymptomatic  Report Completed By:  Stevphen Rochester   Comments:  This is the patients first week progress note for [facility].

## 2015-05-21 NOTE — Progress Notes (Signed)
05/20/2015-Patient discharged from Cardiac Rehabilitation today, May 20, 2015.  Patient attended 4 sessions. Patient stated he has exercise equipment at home and would rather exercise at home.

## 2015-05-22 ENCOUNTER — Encounter (HOSPITAL_COMMUNITY): Payer: Medicare Other

## 2015-05-22 ENCOUNTER — Telehealth: Payer: Self-pay | Admitting: Family Medicine

## 2015-05-22 ENCOUNTER — Ambulatory Visit (INDEPENDENT_AMBULATORY_CARE_PROVIDER_SITE_OTHER): Payer: Medicare Other | Admitting: Family Medicine

## 2015-05-22 ENCOUNTER — Encounter: Payer: Self-pay | Admitting: Family Medicine

## 2015-05-22 VITALS — BP 112/64 | Temp 98.1°F | Ht 72.0 in | Wt 222.0 lb

## 2015-05-22 DIAGNOSIS — E785 Hyperlipidemia, unspecified: Secondary | ICD-10-CM

## 2015-05-22 DIAGNOSIS — I2510 Atherosclerotic heart disease of native coronary artery without angina pectoris: Secondary | ICD-10-CM | POA: Diagnosis not present

## 2015-05-22 DIAGNOSIS — H01006 Unspecified blepharitis left eye, unspecified eyelid: Secondary | ICD-10-CM

## 2015-05-22 DIAGNOSIS — E119 Type 2 diabetes mellitus without complications: Secondary | ICD-10-CM

## 2015-05-22 MED ORDER — SULFACETAMIDE SODIUM 10 % OP SOLN: OPHTHALMIC | Status: DC

## 2015-05-22 NOTE — Telephone Encounter (Signed)
Blood work ordered in EPIC. Patient notified. 

## 2015-05-22 NOTE — Telephone Encounter (Signed)
Pt is requesting lab orders to be sent over. Last labs per epic were: glucose,bmp,cbc on 04/13/15

## 2015-05-22 NOTE — Progress Notes (Signed)
   Subjective:    Patient ID: Jerry Burns, male    DOB: 1933-09-17, 81 y.o.   MRN: [id number]  Conjunctivitis  The current episode started more than 2 weeks ago. The onset was sudden. The problem occurs continuously. The problem has been unchanged. The problem is moderate. Nothing relieves the symptoms. Nothing aggravates the symptoms. Associated symptoms include eye itching and eye discharge. The eye pain is moderate. The left eye is affected. The eyelid exhibits swelling.   Patient states that he has no other concerns at this time.  Sat a wk ago  Did not feel like pink eye  Tried ketoprofen  yrst eye qwas bothering him and crusty   Crusty at times, left eye,   No ha no cheecks pain    Review of Systems  Eyes: Positive for discharge and itching.   no headache no congestion no cough     Objective:   Physical Exam  Alert vitals stable mild malaise left eye crusty somewhat irritated pharynx normal neck supple lungs clear heart regular rate and rhythm      Assessment & Plan:  Impression blepharitis left eye discussed at length I would somewhat everted which is risk factor plan local measures discussed sodium sulfacetamide drops. Baby shampoo symptomatic care discussed WSL

## 2015-05-22 NOTE — Telephone Encounter (Signed)
Lipid, hemoglobin A1c

## 2015-05-24 ENCOUNTER — Encounter (HOSPITAL_COMMUNITY): Payer: Medicare Other

## 2015-05-27 ENCOUNTER — Encounter (HOSPITAL_COMMUNITY): Payer: Medicare Other

## 2015-05-28 DIAGNOSIS — E119 Type 2 diabetes mellitus without complications: Secondary | ICD-10-CM | POA: Diagnosis not present

## 2015-05-28 DIAGNOSIS — E785 Hyperlipidemia, unspecified: Secondary | ICD-10-CM | POA: Diagnosis not present

## 2015-05-29 ENCOUNTER — Encounter (HOSPITAL_COMMUNITY): Payer: Medicare Other

## 2015-05-29 LAB — LIPID PANEL
CHOL/HDL RATIO: 3.2 ratio (ref 0.0–5.0)
Cholesterol, Total: 142 mg/dL (ref 100–199)
HDL: 44 mg/dL (ref >39)
LDL Calculated: 77 mg/dL (ref 0–99)
Triglycerides: 105 mg/dL (ref 0–149)
VLDL CHOLESTEROL CAL: 21 mg/dL (ref 5–40)

## 2015-05-29 LAB — HEMOGLOBIN A1C
Est. average glucose Bld gHb Est-mCnc: 160 mg/dL
HEMOGLOBIN A1C: 7.2 % — AB (ref 4.8–5.6)

## 2015-05-31 ENCOUNTER — Encounter (HOSPITAL_COMMUNITY): Payer: Medicare Other

## 2015-05-31 NOTE — Progress Notes (Signed)
Patient is discharged from [facility] and Pulmonary program today, May 17, 2015 with 4 sessions.  Patient quit program.

## 2015-06-03 ENCOUNTER — Encounter (HOSPITAL_COMMUNITY): Payer: Medicare Other

## 2015-06-04 ENCOUNTER — Encounter: Payer: Self-pay | Admitting: Family Medicine

## 2015-06-04 ENCOUNTER — Ambulatory Visit (INDEPENDENT_AMBULATORY_CARE_PROVIDER_SITE_OTHER): Payer: Medicare Other | Admitting: Family Medicine

## 2015-06-04 VITALS — BP 122/74 | Temp 98.3°F | Ht 72.0 in | Wt 221.0 lb

## 2015-06-04 DIAGNOSIS — H1012 Acute atopic conjunctivitis, left eye: Secondary | ICD-10-CM | POA: Diagnosis not present

## 2015-06-04 DIAGNOSIS — H44002 Unspecified purulent endophthalmitis, left eye: Secondary | ICD-10-CM

## 2015-06-04 DIAGNOSIS — I2510 Atherosclerotic heart disease of native coronary artery without angina pectoris: Secondary | ICD-10-CM

## 2015-06-04 NOTE — Progress Notes (Signed)
   Subjective:    Patient ID: Jerry Burns, male    DOB: [birth year], 81 y.o.   MRN: [id number]  HPIleft eye itching, burning and painful. Has tried ketotifen funarate and sulfacetamide.  His eye problem started approximately 7-10 days ago. Was seen by Dr. Richardson Landry was prescribed sulfa drops apparently patient had had allergies to these in the past and he was not aware. He relates the area became more inflamed with significant amount of draining discharge. Patient relates swelling around the lower eye fuzzy vision due to excessive tearing Review of Systems Denies fever chills headaches facial pain relates soreness around the eye patient has history of diabetes    Objective:   Physical Exam Has significant redness of the conjunctiva along with swelling of the lower eyelid drooping as well the eyeball itself does not appear to be infected. Swelling in the facial area appears to be more edema rather than cellulitis  I doubt cellulitis I would not recommend antibiotics currently but I do recommend urgent consultation with ophthalmology     Assessment & Plan:  Significant inflammation of the eyes probable blepharitis because of the severe nature I do not want to put him on additional drops I would like for him to be seen by ophthalmology patient was set up for an appointment today 1:15. Patient is aware of the appointment

## 2015-06-05 ENCOUNTER — Encounter (HOSPITAL_COMMUNITY): Payer: Medicare Other

## 2015-06-06 ENCOUNTER — Encounter: Payer: Self-pay | Admitting: Family Medicine

## 2015-06-06 ENCOUNTER — Ambulatory Visit (INDEPENDENT_AMBULATORY_CARE_PROVIDER_SITE_OTHER): Payer: Medicare Other | Admitting: Family Medicine

## 2015-06-06 VITALS — BP 110/64 | Temp 98.4°F | Ht 72.0 in | Wt 219.0 lb

## 2015-06-06 DIAGNOSIS — Z23 Encounter for immunization: Secondary | ICD-10-CM

## 2015-06-06 DIAGNOSIS — E119 Type 2 diabetes mellitus without complications: Secondary | ICD-10-CM

## 2015-06-06 DIAGNOSIS — I10 Essential (primary) hypertension: Secondary | ICD-10-CM

## 2015-06-06 DIAGNOSIS — E785 Hyperlipidemia, unspecified: Secondary | ICD-10-CM | POA: Diagnosis not present

## 2015-06-06 DIAGNOSIS — H01006 Unspecified blepharitis left eye, unspecified eyelid: Secondary | ICD-10-CM | POA: Diagnosis not present

## 2015-06-06 DIAGNOSIS — I2510 Atherosclerotic heart disease of native coronary artery without angina pectoris: Secondary | ICD-10-CM

## 2015-06-06 MED ORDER — ATORVASTATIN CALCIUM 80 MG PO TABS: 80.0000 mg | ORAL_TABLET | Freq: Every day | ORAL | Status: DC

## 2015-06-06 NOTE — Progress Notes (Signed)
   Subjective:    Patient ID: Jerry Burns, male    DOB: [birth year], 81 y.o.   MRN: [id number]  Diabetes He presents for his follow-up diabetic visit. He has type 2 diabetes mellitus. He is compliant with treatment all of the time. Exercise: walks daily. He does not see a podiatrist.Eye exam is not current (next month).  A1C done on bw.A1c stable. Actually slightly improved compared it was he brings in readings they were reviewed overall the numbers looked fairly good  pt wants to discuss if he should take turmeric. He was taking this because his wife told him he should take it.  Pt states he takes lipitor 40mg  instead of 80mg  . 80mg  on med list. No record of 40mg  being sent in and he states he does not cut in half. Seems to be some confusion regarding his cholesterol medicine. He states he takes 40.  Wants to know if he should continue taking metamucil. Patient states bowel movements soft not bloody.  Pt states he heard that he should take insulin warm not cold.   Flu vaccine today. Patient states his blood pressure is staying on the low end ever since he had a stent placed. We will be doing orthostatics today as part of his visit he denies any increase in his medicine. Patient saw ophthalmologist a put him on drop he feels things are starting to get better  Review of Systems Denies chest tightness pressure pain shortness breath denies nausea vomiting diarrhea relates some arthralgias denies any unilateral numbness or weakness no headaches    Objective:   Physical Exam Patient still has blepharitis left eye does not appear to be as severe he is on a new drop from the ophthalmologist there is still some swelling but it seems to be getting better Lungs clear heart regular abdomen soft diabetic foot exam completed blood pressure checked laying sitting standing there is no orthostatic hypotension  Lab work including lipid and A1c reviewed with the patient    Assessment & Plan:  Continue  current blood pressure medicine I recommend against taking turmeric Diabetes good control Hyperlipidemia LDL above goal increase statin use 80 mg. Comprehensive lab work before office visit in March 25 minutes was spent with the patient. Greater than half the time was spent in discussion and answering questions and counseling regarding the issues that the patient came in for today.

## 2015-06-07 ENCOUNTER — Encounter (HOSPITAL_COMMUNITY): Payer: Medicare Other

## 2015-06-07 ENCOUNTER — Ambulatory Visit: Payer: Medicare Other | Admitting: Family Medicine

## 2015-06-10 ENCOUNTER — Encounter (HOSPITAL_COMMUNITY): Payer: Medicare Other

## 2015-06-11 DIAGNOSIS — H1012 Acute atopic conjunctivitis, left eye: Secondary | ICD-10-CM | POA: Diagnosis not present

## 2015-06-11 DIAGNOSIS — H02135 Senile ectropion of left lower eyelid: Secondary | ICD-10-CM | POA: Diagnosis not present

## 2015-06-12 ENCOUNTER — Encounter: Payer: Self-pay | Admitting: Internal Medicine

## 2015-06-12 ENCOUNTER — Encounter (HOSPITAL_COMMUNITY): Payer: Medicare Other

## 2015-06-13 ENCOUNTER — Other Ambulatory Visit: Payer: Self-pay | Admitting: Family Medicine

## 2015-06-14 ENCOUNTER — Telehealth: Payer: Self-pay | Admitting: Family Medicine

## 2015-06-14 ENCOUNTER — Encounter (HOSPITAL_COMMUNITY): Payer: Medicare Other

## 2015-06-14 NOTE — Telephone Encounter (Signed)
Discussed with pt. Pt verbalized understanding.  °

## 2015-06-14 NOTE — Telephone Encounter (Signed)
Persistent tearing not unusal after an auce eye infxn, Sorry, but , pt needs to f u with eye doc if symptoms persist

## 2015-06-14 NOTE — Telephone Encounter (Signed)
Patient called with c/o of edge of eyelid swelling, and constant tearing of the eye. Patient stated that he recently saw Dr.Scott on 06/04/2015 and was told to follow up with Opthalmology. Patient went to Opthalmology and was prescribed eye drops that helped eye out and stated that it has drastically improved. Patient was wondering if we could recommend any other eye drops to help with the swelling of outer eyelid and constant tearing. Please advise?

## 2015-06-17 ENCOUNTER — Other Ambulatory Visit: Payer: Self-pay | Admitting: Family Medicine

## 2015-06-17 ENCOUNTER — Encounter (HOSPITAL_COMMUNITY): Payer: Medicare Other

## 2015-06-19 ENCOUNTER — Encounter (HOSPITAL_COMMUNITY): Payer: Medicare Other

## 2015-06-21 ENCOUNTER — Encounter (HOSPITAL_COMMUNITY): Payer: Medicare Other

## 2015-06-24 ENCOUNTER — Encounter (HOSPITAL_COMMUNITY): Payer: Medicare Other

## 2015-06-24 DIAGNOSIS — H02135 Senile ectropion of left lower eyelid: Secondary | ICD-10-CM | POA: Diagnosis not present

## 2015-06-24 DIAGNOSIS — H401131 Primary open-angle glaucoma, bilateral, mild stage: Secondary | ICD-10-CM | POA: Diagnosis not present

## 2015-06-26 ENCOUNTER — Encounter (HOSPITAL_COMMUNITY): Payer: Medicare Other

## 2015-06-28 ENCOUNTER — Encounter (HOSPITAL_COMMUNITY): Payer: Medicare Other

## 2015-07-01 ENCOUNTER — Encounter (HOSPITAL_COMMUNITY): Payer: Medicare Other

## 2015-07-03 ENCOUNTER — Encounter (HOSPITAL_COMMUNITY): Payer: Medicare Other

## 2015-07-05 ENCOUNTER — Encounter (HOSPITAL_COMMUNITY): Payer: Medicare Other

## 2015-07-08 ENCOUNTER — Encounter (HOSPITAL_COMMUNITY): Payer: Medicare Other

## 2015-07-10 ENCOUNTER — Encounter (HOSPITAL_COMMUNITY): Payer: Medicare Other

## 2015-07-12 ENCOUNTER — Encounter (HOSPITAL_COMMUNITY): Payer: Medicare Other

## 2015-07-15 ENCOUNTER — Encounter (HOSPITAL_COMMUNITY): Payer: Medicare Other

## 2015-07-17 ENCOUNTER — Encounter (HOSPITAL_COMMUNITY): Payer: Medicare Other

## 2015-07-19 ENCOUNTER — Encounter (HOSPITAL_COMMUNITY): Payer: Medicare Other

## 2015-07-22 ENCOUNTER — Encounter (HOSPITAL_COMMUNITY): Payer: Medicare Other

## 2015-07-23 ENCOUNTER — Other Ambulatory Visit: Payer: Self-pay | Admitting: Cardiovascular Disease

## 2015-07-24 ENCOUNTER — Encounter (HOSPITAL_COMMUNITY): Payer: Medicare Other

## 2015-07-26 ENCOUNTER — Encounter (HOSPITAL_COMMUNITY): Payer: Medicare Other

## 2015-07-29 ENCOUNTER — Encounter (HOSPITAL_COMMUNITY): Payer: Medicare Other

## 2015-07-30 DIAGNOSIS — H02135 Senile ectropion of left lower eyelid: Secondary | ICD-10-CM | POA: Diagnosis not present

## 2015-07-31 ENCOUNTER — Encounter (HOSPITAL_COMMUNITY): Payer: Medicare Other

## 2015-08-02 ENCOUNTER — Encounter (HOSPITAL_COMMUNITY): Payer: Medicare Other

## 2015-08-05 ENCOUNTER — Telehealth: Payer: Self-pay | Admitting: Family Medicine

## 2015-08-05 DIAGNOSIS — E119 Type 2 diabetes mellitus without complications: Secondary | ICD-10-CM

## 2015-08-05 NOTE — Telephone Encounter (Signed)
Patient has written a letter for Dr. Nicki Reaper to review regarding very erratic and unusual glucose readings that he has had.  Please advise.  This has been placed in red folder.

## 2015-08-07 ENCOUNTER — Other Ambulatory Visit: Payer: Self-pay | Admitting: Adult Health

## 2015-08-07 ENCOUNTER — Encounter: Payer: Self-pay | Admitting: Family Medicine

## 2015-08-07 NOTE — Telephone Encounter (Signed)
Spoke with patient and informed him per Dr.Scott Luking- if possible he would like an office visit to discuss blood sugars. Preferably in tomorrow morning. Patient verbalized understanding.

## 2015-08-07 NOTE — Telephone Encounter (Signed)
[facility] 08/07/15

## 2015-08-07 NOTE — Telephone Encounter (Signed)
Called and spoke with patient and informed him per Dr.Scott Luking- advised the patient to try to not drop his long-acting insulin below 20 on any given day unless evening glucose readings are below 90. Secondly I believe this patient would benefit from diabetic education consultation with diabetic center. Third I would like for the patient to keep track of his readings over the course of the next week in follow-up next week with those readings. Call us sooner if any problems. Patient verbalized understanding and stated that he does not understand why he is to decrease Lantus to 20 units when his morning glucose readings have been above 300. Patient stated he does not feel comfortable with changes this dose because he is scared that his blood sugars will run even higher. Also patient informed me that he is currently taking Lantus insulin in the morning after breakfast due to glucose readings still being above 300 (patient states he eats only oatmeal in the mornings. Referral to be put into epic for Diabetic education.

## 2015-08-07 NOTE — Telephone Encounter (Signed)
His numbers reflect a for IT of glucose readings as well as of her INT of insulin injections long-acting. Nurse's-advised the patient to try to not drop his long-acting insulin below 20 on any given day unless evening glucose readings are below 90. Secondly I believe this patient would benefit from diabetic education consultation with diabetic center. Third I would like for the patient to keep track of his readings over the course of the next week in follow-up next week with those readings. Call us sooner if any problems.

## 2015-08-07 NOTE — Telephone Encounter (Signed)
If possible please have the patient come in tomorrow either in the morning preferably

## 2015-08-08 ENCOUNTER — Encounter: Payer: Self-pay | Admitting: Family Medicine

## 2015-08-08 ENCOUNTER — Ambulatory Visit (INDEPENDENT_AMBULATORY_CARE_PROVIDER_SITE_OTHER): Payer: Medicare Other | Admitting: Family Medicine

## 2015-08-08 VITALS — BP 128/70 | Ht 72.0 in | Wt 221.4 lb

## 2015-08-08 DIAGNOSIS — E119 Type 2 diabetes mellitus without complications: Secondary | ICD-10-CM | POA: Diagnosis not present

## 2015-08-08 MED ORDER — INSULIN LISPRO 100 UNIT/ML ~~LOC~~ SOLN: SUBCUTANEOUS | Status: DC

## 2015-08-08 NOTE — Progress Notes (Addendum)
   Subjective:    Patient ID: Jerry Burns, male    DOB: March 23, 1934, 82 y.o.   MRN: [id number]  Diabetes He presents for his follow-up diabetic visit. He has type 2 diabetes mellitus. There are no hypoglycemic associated symptoms. There are no diabetic associated symptoms. There are no hypoglycemic complications. There are no diabetic complications. There are no known risk factors for coronary artery disease. Current diabetic treatment includes insulin injections. He is compliant with treatment all of the time.   Patient is concerned about his blood sugar readings.    Review of Systems     Relates negative for excessive thirst urination fever chills Objective:   Physical Exam  15-20 minutes spent with patient discussing diabetes his numbers going up and down patient frustrated   lungs clear heart regular abdomen soft     Assessment & Plan:   diabetes number subpar recently stop glipizide. Use short-acting insulin with meals started off 4 units with meals monitor her numbers closely notify us of these numbers next week long-acting insulin 20 per day    consultation with diabetic specialist I think would be helpful for this patient   follow-up in a few months  This person is treated with insulin. He uses long-acting insulin and also insulin at mealtimes. As a result of all of this he checks his sugar on a regular basis typically 4 times per day. Patient does have fluctuating sugars. Patient is compliant with treatment.

## 2015-08-10 ENCOUNTER — Encounter: Payer: Self-pay | Admitting: Family Medicine

## 2015-08-14 ENCOUNTER — Telehealth: Payer: Self-pay | Admitting: Family Medicine

## 2015-08-14 NOTE — Telephone Encounter (Signed)
Please review letter that was e-mailed to me by the patient regarding his glucose readings.  This has been placed in a red folder at the nurse station.

## 2015-08-18 NOTE — Telephone Encounter (Signed)
Please let the patient know I am pleased with how his readings are. He will be seen endocrinologist in early February he should are ready be aware of that appointment then he will be seen dietitian as well and he will follow-up with [place] in March

## 2015-08-19 DIAGNOSIS — H02135 Senile ectropion of left lower eyelid: Secondary | ICD-10-CM | POA: Diagnosis not present

## 2015-08-19 DIAGNOSIS — H02535 Eyelid retraction left lower eyelid: Secondary | ICD-10-CM | POA: Diagnosis not present

## 2015-08-19 DIAGNOSIS — H11433 Conjunctival hyperemia, bilateral: Secondary | ICD-10-CM | POA: Diagnosis not present

## 2015-08-19 NOTE — Telephone Encounter (Signed)
Called patient and informed him per Dr.Scott Luking- Dr.Scott is pleased with how his readings are. Will be seeing endocrinologist in early February he should all ready be aware of that appointment, he will also be seeing a dietician as well and he will follow up with [place] in March. Patient verbalized understanding.

## 2015-08-27 ENCOUNTER — Telehealth: Payer: Self-pay | Admitting: Family Medicine

## 2015-08-27 DIAGNOSIS — L609 Nail disorder, unspecified: Secondary | ICD-10-CM

## 2015-08-27 NOTE — Telephone Encounter (Signed)
Dr. Nicki Reaper, Patient was advised to repeatedly examine his feet for sores or other problems. He says he has a persistent problem on the big toe on his right foot that has lasted about three weeks. It feels like an ingrown toe nail but he can not see any contact between nail and skin. So far there appears not to be any infection. He had ingrown toe nails once 10 years ago and another 20 years ago. Both times a podiatrist removes parts of his nail. Please advise.

## 2015-08-27 NOTE — Telephone Encounter (Signed)
[facility] (referral in the system)

## 2015-08-27 NOTE — Telephone Encounter (Signed)
Notified patient Jerry Burns recommends that we help set him up with Dr. Arvil Persons office. Patient agreed and verbalized understanding.

## 2015-08-27 NOTE — Telephone Encounter (Signed)
I would recommend that we help set him up with Dr. Arvil Persons office

## 2015-09-02 ENCOUNTER — Telehealth: Payer: Self-pay | Admitting: Adult Health

## 2015-09-02 NOTE — Telephone Encounter (Signed)
Will forward to K Lawrence NP 

## 2015-09-02 NOTE — Telephone Encounter (Signed)
Pt is needing to have a Colonoscopy with Dr. Gala Romney and eye surgery with a Dr. Trenton Founds of [place]  (he has a droopy eye lid) and they are wanting to know if he can stop Plavix for 1-2 days

## 2015-09-02 NOTE — Telephone Encounter (Signed)
[facility] 09/02/15 

## 2015-09-02 NOTE — Telephone Encounter (Signed)
Called patient and informed him per Dr.Scott Luking-In regards to colonoscopy they're recommending waiting until September 2017. In regards to the procedure they recommend that he only have this done if the surgeon is comfortable with him staying on Plavix. They do not recommend for him to stop Plavix. Essentially he needs to stay on Plavix for a whole year. I can discuss this issue further with him when he follows up in early March. Also- I would recommend if the patient has additional questions regarding Plavix medication that is prescribed by cardiology he gives cardiology a call back Then possibly they can clarify further if he needs that to be done. Patient verbalized understanding.

## 2015-09-02 NOTE — Telephone Encounter (Signed)
Nurse's-please inform the patient of the answers that cardiology gave. In regards to colonoscopy they're recommending waiting until September 2017. In regards to the procedure they recommend that he only have this done if the surgeon is comfortable with him staying on Plavix. They do not recommend for him to stop Plavix.  Essentially he needs to stay on Plavix for a whole year. I can discuss this issue further with him when he follows up in early March. Also- I would recommend if the patient has additional questions regarding Plavix medication that is prescribed by cardiology he gives cardiology a call back  Then possibly they can clarify further if he needs that to be done.

## 2015-09-02 NOTE — Telephone Encounter (Signed)
It is best to delay elective procedures for one year before stopping Plavix. He had PTCA in September of 2016. Will need to wait at least until Sept 2017 if this is elective. Concerning his eye surgery. If surgeon is comfortable with this on Plavix he can proceed.

## 2015-09-05 ENCOUNTER — Telehealth: Payer: Self-pay | Admitting: Adult Health

## 2015-09-05 NOTE — Telephone Encounter (Signed)
Forward to Arnold Long NP

## 2015-09-05 NOTE — Telephone Encounter (Signed)
Pt is having a left lower eye lid repair, Dr. Lorina Rabon is needing a letter written letter from [facility] saying it's ok for the pt to have the procedure done while on the blood thinner. Carrie w/ Dr. Lorina Rabon said they were fine w/ him being on the blood thinner for this certain procedure. Procedure will be at [facility] on 09/30/15   Tele [phone] ext [phone]  fax [phone] attention Morey Hummingbird

## 2015-09-06 NOTE — Telephone Encounter (Signed)
Will create letter and sent to Dr Lorina Rabon

## 2015-09-06 NOTE — Telephone Encounter (Signed)
Please send letter stating that he can have the surgery without cardiac limitations. I will sign if necessary.

## 2015-09-09 ENCOUNTER — Ambulatory Visit (INDEPENDENT_AMBULATORY_CARE_PROVIDER_SITE_OTHER): Payer: Medicare Other | Admitting: "Endocrinology

## 2015-09-09 ENCOUNTER — Encounter: Payer: Self-pay | Admitting: "Endocrinology

## 2015-09-09 VITALS — BP 141/76 | HR 91 | Ht 72.0 in | Wt 219.0 lb

## 2015-09-09 DIAGNOSIS — E785 Hyperlipidemia, unspecified: Secondary | ICD-10-CM | POA: Diagnosis not present

## 2015-09-09 DIAGNOSIS — I10 Essential (primary) hypertension: Secondary | ICD-10-CM

## 2015-09-09 DIAGNOSIS — E1159 Type 2 diabetes mellitus with other circulatory complications: Secondary | ICD-10-CM | POA: Diagnosis not present

## 2015-09-09 MED ORDER — INSULIN GLARGINE 100 UNIT/ML SOLOSTAR PEN: 20.0000 [IU] | PEN_INJECTOR | Freq: Every day | SUBCUTANEOUS | Status: DC

## 2015-09-09 NOTE — Patient Instructions (Signed)

## 2015-09-09 NOTE — Progress Notes (Signed)
Subjective:    Patient ID: Jerry Burns, male    DOB: 1933-12-10. Patient is being seen in consultation for management of diabetes requested by  Jerry Lange, MD  Past Medical History  Diagnosis Date  . Hyperlipidemia   . Stroke ([place])   . Arthritis     Bilateral knee  . Hypertension   . Coronary artery disease   . Anginal pain ([place])   . Diabetes mellitus     insulin dependent   . Progressive angina ([place]) 03/2015    3.0x12 mm Resolute DES to pLAD, Angiosculpt scoring balloon to dLAD   Past Surgical History  Procedure Laterality Date  . Tonsillectomy    . Appendectomy    . Shoulder surgery    . Colonoscopy  2011    RMR: 1. Normal rectum 2. Left sided diverticula , 2 ascending colon polyps, status post snare polypectomy. Remainder of colonic mucosa appeared unremarkable.   . Coronary angioplasty with stent placement  08/22/2014    PTCA/DES X mLAD with 2.5 x 16 mm Promus Premier DES by Dr Julianne Handler  . Foot surgery Right   . Left heart catheterization with coronary angiogram N/A 08/22/2014    Procedure: LEFT HEART CATHETERIZATION WITH CORONARY ANGIOGRAM;  Surgeon: Burnell Blanks, MD;  Location: [facility] C[facility] LAB;  Service: Cardiovascular;  Laterality: N/A;  . Percutaneous coronary stent intervention (pci-s)  08/22/2014    Procedure: PERCUTANEOUS CORONARY STENT INTERVENTION (PCI-S);  Surgeon: Burnell Blanks, MD;   . Cardiac catheterization N/A 04/12/2015    Procedure: Left Heart Cath and Coronary Angiography;  Surgeon: Sherren Mocha, MD; pLAD 75>0% w/ 3.0x12 mm Resolute DES, dLAD 80>0% w/ Angiosculpt scoring balloon, CFX luminal irreg, RCA mild dz, EF 55-65%    Social History   Social History  . Marital Status: Married    Spouse Name: N/A  . Number of Children: N/A  . Years of Education: 14   Occupational History  . retired    Social History Main Topics  . Smoking status: Former Smoker -- 1.00 packs/day for 20 years    Types: Cigarettes, Cigars    Start  date: 08/10/1955    Quit date: 07/28/2007  . Smokeless tobacco: Never Used  . Alcohol Use: 0.0 oz/week    0 Standard drinks or equivalent per week  . Drug Use: No  . Sexual Activity: Not Asked   Other Topics Concern  . None   Social History Narrative   Outpatient Encounter Prescriptions as of 09/09/2015  Medication Sig  . amLODipine (NORVASC) 5 MG tablet TAKE 1 BY MOUTH DAILY  . aspirin EC 81 MG tablet Take 81 mg by mouth daily.  Marland Kitchen atorvastatin (LIPITOR) 80 MG tablet Take 1 tablet (80 mg total) by mouth daily.  . carvedilol (COREG) 6.25 MG tablet TAKE (1) TABLET BY MOUTH TWICE DAILY.  Marland Kitchen clopidogrel (PLAVIX) 75 MG tablet Take 1 tablet (75 mg total) by mouth daily with breakfast.  . dutasteride (AVODART) 0.5 MG capsule TAKE 1 BY MOUTH DAILY  . glucose blood (BAYER CONTOUR TEST) test strip USE TO TEST TWICE DAILY  . Insulin Glargine (LANTUS SOLOSTAR) 100 UNIT/ML Solostar Pen Inject 20 Units into the skin daily at 10 pm.  . metFORMIN (GLUCOPHAGE) 1000 MG tablet TAKE 1 BY MOUTH TWICE DAILY WITH MEALS  . mometasone (ELOCON) 0.1 % cream Apply to affected area daily  . Multiple Vitamin (MULTIVITAMIN) tablet Take 1 tablet by mouth daily.  . nitroGLYCERIN (NITROSTAT) 0.4 MG SL tablet PLACE 1  TAB UNDER TONGUE EVERY 5 MIN IF NEEDED FOR CHEST PAIN. MAY USE 3 TIMES.NO RELIEF CALL 911.  . psyllium (METAMUCIL) 58.6 % powder Take 1 packet by mouth.  . quinapril (ACCUPRIL) 40 MG tablet TAKE 1 BY MOUTH AT BEDTIME  . RELION MINI PEN NEEDLES 31G X 6 MM MISC USE AS DIRECTED  . tamsulosin (FLOMAX) 0.4 MG CAPS capsule TAKE 1 BY MOUTH DAILY  . tobramycin-dexamethasone (TOBRADEX) ophthalmic solution every 4 (four) hours while awake.  . [DISCONTINUED] amLODipine (NORVASC) 5 MG tablet TAKE 1 BY MOUTH DAILY  . [DISCONTINUED] atorvastatin (LIPITOR) 80 MG tablet TAKE 1 BY MOUTH DAILY  . [DISCONTINUED] insulin lispro (HUMALOG) 100 UNIT/ML injection Use 4-8 units with meals TID  . [DISCONTINUED] LANTUS SOLOSTAR 100  UNIT/ML Solostar Pen INJECT 31 TO 34 UNITS SUBCUTANEOUSLY AT BEDTIME   No facility-administered encounter medications on file as of 09/09/2015.   ALLERGIES: Allergies  Allergen Reactions  . Sulfa Antibiotics   . Latex Other (See Comments)    bandaids cause blisters after 24 hours    VACCINATION STATUS: Immunization History  Administered Date(s) Administered  . Influenza Whole 05/04/2007, 05/01/2008, 05/04/2008  . Influenza,inj,Quad PF,36+ Mos 05/07/2014, 06/06/2015  . Influenza-Unspecified 04/26/2012, 05/17/2013  . Pneumococcal Conjugate-13 01/30/2014  . Pneumococcal Polysaccharide-23 07/27/2006  . Td 05/01/2008, 05/04/2008  . Zoster 07/27/2008    Diabetes He presents for his initial diabetic visit. He has type 2 diabetes mellitus. Onset time: He was diagnosed at approximate age of 56 years. His disease course has been stable. There are no hypoglycemic associated symptoms. Pertinent negatives for hypoglycemia include no confusion, headaches, pallor or seizures. There are no diabetic associated symptoms. Pertinent negatives for diabetes include no chest pain, no fatigue, no polydipsia, no polyphagia, no polyuria and no weakness. Symptoms are stable. Diabetic complications include a CVA. Risk factors for coronary artery disease include diabetes mellitus, dyslipidemia, hypertension, male sex, obesity, sedentary lifestyle and tobacco exposure. Current diabetic treatment includes intensive insulin program and oral agent (monotherapy). He is compliant with treatment most of the time. His weight is stable. He is following a generally unhealthy diet. When asked about meal planning, he reported none. He has not had a previous visit with a dietitian. He participates in exercise intermittently. There is no change in his home blood glucose trend. His overall blood glucose range is 140-180 mg/dl. An ACE inhibitor/angiotensin II receptor blocker is being taken. Eye exam is current.  Hyperlipidemia This  is a chronic problem. The current episode started more than 1 year ago. The problem is controlled. Exacerbating diseases include diabetes and obesity. Pertinent negatives include no chest pain, myalgias or shortness of breath. Current antihyperlipidemic treatment includes statins. Risk factors for coronary artery disease include diabetes mellitus, dyslipidemia, hypertension, male sex and obesity.  Hypertension This is a chronic problem. The current episode started more than 1 year ago. The problem is controlled. Pertinent negatives include no chest pain, headaches, neck pain, palpitations or shortness of breath. Risk factors for coronary artery disease include diabetes mellitus, dyslipidemia, obesity and smoking/tobacco exposure. Past treatments include ACE inhibitors. Hypertensive end-organ damage includes CAD/MI and CVA.    Review of Systems  Constitutional: Negative for fever, chills, fatigue and unexpected weight change.  HENT: Negative for dental problem, mouth sores and trouble swallowing.   Eyes: Negative for visual disturbance.  Respiratory: Negative for cough, choking, chest tightness, shortness of breath and wheezing.   Cardiovascular: Negative for chest pain, palpitations and leg swelling.  Gastrointestinal: Negative for nausea, vomiting, abdominal  pain, diarrhea, constipation and abdominal distention.  Endocrine: Negative for polydipsia, polyphagia and polyuria.  Genitourinary: Negative for dysuria, urgency, hematuria and flank pain.  Musculoskeletal: Negative for myalgias, back pain, gait problem and neck pain.  Skin: Negative for pallor, rash and wound.  Neurological: Negative for seizures, syncope, weakness, numbness and headaches.  Psychiatric/Behavioral: Negative.  Negative for confusion and dysphoric mood.    Objective:    BP 141/76 mmHg  Pulse 91  Ht 6' (1.829 m)  Wt 219 lb (99.338 kg)  BMI 29.70 kg/m2  SpO2 95%  Wt Readings from Last 3 Encounters:  09/09/15 219 lb  (99.338 kg)  08/08/15 221 lb 6 oz (100.415 kg)  06/06/15 219 lb (99.338 kg)    Physical Exam  Constitutional: He is oriented to person, place, and time. He appears well-developed and well-nourished. He is cooperative. No distress.  HENT:  Head: Normocephalic and atraumatic.  Eyes: EOM are normal.  Neck: Normal range of motion. Neck supple. No tracheal deviation present. No thyromegaly present.  Cardiovascular: Normal rate, S1 normal, S2 normal and normal heart sounds.  Exam reveals no gallop.   No murmur heard. Pulses:      Dorsalis pedis pulses are 1+ on the right side, and 1+ on the left side.       Posterior tibial pulses are 1+ on the right side, and 1+ on the left side.  Pulmonary/Chest: Breath sounds normal. No respiratory distress. He has no wheezes.  Abdominal: Soft. Bowel sounds are normal. He exhibits no distension. There is no tenderness. There is no guarding and no CVA tenderness.  Musculoskeletal: He exhibits no edema.       Right shoulder: He exhibits no swelling and no deformity.  Neurological: He is alert and oriented to person, place, and time. He has normal strength and normal reflexes. No cranial nerve deficit or sensory deficit. Gait normal.  Skin: Skin is warm and dry. No rash noted. No cyanosis. Nails show no clubbing.  Psychiatric: He has a normal mood and affect. His speech is normal and behavior is normal. Judgment and thought content normal. Cognition and memory are normal.    CMP     Component Value Date/Time   NA 137 04/13/2015 0424   NA 139 02/27/2015 0804   K 4.2 04/13/2015 0424   CL 103 04/13/2015 0424   CO2 28 04/13/2015 0424   GLUCOSE 216* 04/13/2015 0424   GLUCOSE 193* 02/27/2015 0804   BUN 19 04/13/2015 0424   BUN 21 02/27/2015 0804   CREATININE 0.98 04/13/2015 0424   CREATININE 0.93 04/23/2014 0704   CALCIUM 9.2 04/13/2015 0424   PROT 6.8 02/27/2015 0804   PROT 6.4 04/23/2014 0704   ALBUMIN 4.6 02/27/2015 0804   ALBUMIN 4.0 04/23/2014 0704    AST 17 02/27/2015 0804   ALT 16 02/27/2015 0804   ALKPHOS 77 02/27/2015 0804   BILITOT 0.5 02/27/2015 0804   BILITOT 0.5 04/23/2014 0704   GFRNONAA >60 04/13/2015 0424   GFRAA >60 04/13/2015 0424     Diabetic Labs (most recent): Lab Results  Component Value Date   HGBA1C 7.2* 05/28/2015   HGBA1C 7.4* 02/27/2015   HGBA1C 7.3* 11/02/2014     Lipid Panel ( most recent) Lipid Panel     Component Value Date/Time   CHOL 142 05/28/2015 0805   CHOL 155 07/31/2014 0703   TRIG 105 05/28/2015 0805   HDL 44 05/28/2015 0805   HDL 53 07/31/2014 0703   CHOLHDL 3.2 05/28/2015 0805  CHOLHDL 2.9 07/31/2014 0703   VLDL 19 07/31/2014 0703   LDLCALC 77 05/28/2015 0805   LDLCALC 83 07/31/2014 0703     Assessment & Plan:   1. DM type 2 causing vascular disease ([place]) - Patient has currently uncontrolled asymptomatic type 2 DM since  82 years of age,  with most recent A1c of 7.2 %. Recent labs reviewed.   His diabetes is complicated by coronary artery disease, CVA and patient remains at a high risk for more acute and chronic complications of diabetes which include CAD, CVA, CKD, retinopathy, and neuropathy. These are all discussed in detail with the patient.  - I have counseled the patient on diet management and weight loss, by adopting a carbohydrate restricted/protein rich diet.  - Suggestion is made for patient to avoid simple carbohydrates   from their diet including Cakes , Desserts, Ice Cream,  Soda (  diet and regular) , Sweet Tea , Candies,  Chips, Cookies, Artificial Sweeteners,   and "Sugar-free" Products . This will help patient to have stable blood glucose profile and potentially avoid unintended weight gain.  - I encouraged the patient to switch to  unprocessed or minimally processed complex starch and increased protein intake (animal or plant source), fruits, and vegetables.  - Patient is advised to stick to a routine mealtimes to eat 3 meals  a day and avoid unnecessary  snacks ( to snack only to correct hypoglycemia).  - The patient will be scheduled with Jerry Burns, RDN, CDE for individualized DM education.  - I have approached patient with the following individualized plan to manage diabetes and patient agrees:   - I  will proceed with basal insulin Lantus 20 units QHS, and hold his prandial insulin humalog until his next visit in 1 week. -Until his next visit is advised to start strict monitoring of glucose  AC and HS. - Patient is warned not to take insulin without proper monitoring per orders.  -Patient is encouraged to call clinic for blood glucose levels less than 70 or above 300 mg /dl. - I will continue metformin 1000 mg by mouth twice a day, therapeutically suitable for patient.  - Patient will be considered for incretin therapy as appropriate next visit. - Patient specific target  A1c;  LDL, HDL, Triglycerides, and  Waist Circumference were discussed in detail.  2) BP/HTN: Controlled. Continue current medications including ACEI/ARB. 3) Lipids/HPL:  Controlled, continue statins. 4)  Weight/Diet: CDE Consult will be initiated , exercise, and detailed carbohydrates information provided.  5) Chronic Care/Health Maintenance:  -Patient is on ACEI/ARB and Statin medications and encouraged to continue to follow up with Ophthalmology, Podiatrist at least yearly or according to recommendations, and advised to   stay away from smoking. I have recommended yearly flu vaccine and pneumonia vaccination at least every 5 years; moderate intensity exercise for up to 150 minutes weekly; and  sleep for at least 7 hours a day.  - 60 minutes of time was spent on the care of this patient , 50% of which was applied for counseling on diabetes complications and their preventions.  - Patient to bring meter and  blood glucose logs during their next visit.   - I advised patient to maintain close follow up with Jerry Lange, MD for primary care needs.  Follow up  plan: - Return in about 1 week (around 09/16/2015) for diabetes, high blood pressure, high cholesterol, follow up with pre-visit labs, meter, and logs, follow up with pre-visit labs.  Jerry [place]  Dorris Fetch, MD Phone: [phone]  Fax: [phone]   09/09/2015, 3:45 PM

## 2015-09-10 ENCOUNTER — Telehealth: Payer: Self-pay | Admitting: Family Medicine

## 2015-09-10 DIAGNOSIS — E1159 Type 2 diabetes mellitus with other circulatory complications: Secondary | ICD-10-CM | POA: Diagnosis not present

## 2015-09-10 DIAGNOSIS — E785 Hyperlipidemia, unspecified: Secondary | ICD-10-CM

## 2015-09-10 NOTE — Telephone Encounter (Signed)
Patient requesting blood work to be ordered for his appointment on 09/25/2015.

## 2015-09-10 NOTE — Telephone Encounter (Signed)
Lipid liver profile-his other labs have been ordered through his endocrinologist

## 2015-09-10 NOTE — Telephone Encounter (Signed)
Spoke with patient and informed him per Dr.Scott Luking- lipid, liver were ordered needs to be fasting prior to labs. Patient verbalized understanding.

## 2015-09-11 LAB — MICROALBUMIN / CREATININE URINE RATIO
Creatinine, Urine: 21.3 mg/dL
MICROALB/CREAT RATIO: 203.3 mg/g creat — ABNORMAL HIGH (ref 0.0–30.0)
MICROALBUM., U, RANDOM: 43.3 ug/mL

## 2015-09-11 LAB — CMP14+EGFR
A/G RATIO: 1.9 (ref 1.1–2.5)
ALBUMIN: 4.3 g/dL (ref 3.5–4.8)
ALK PHOS: 71 IU/L (ref 39–117)
ALT: 13 IU/L (ref 0–44)
AST: 17 IU/L (ref 0–40)
BILIRUBIN TOTAL: 0.4 mg/dL (ref 0.0–1.2)
BUN / CREAT RATIO: 23 — AB (ref 10–22)
BUN: 25 mg/dL (ref 8–27)
CHLORIDE: 99 mmol/L (ref 96–106)
CO2: 24 mmol/L (ref 18–29)
Calcium: 9.5 mg/dL (ref 8.6–10.2)
Creatinine, Ser: 1.08 mg/dL (ref 0.76–1.27)
GFR calc non Af Amer: 65 mL/min/{1.73_m2} (ref >59)
GFR, EST AFRICAN AMERICAN: 75 mL/min/{1.73_m2} (ref >59)
GLUCOSE: 135 mg/dL — AB (ref 65–99)
Globulin, Total: 2.3 g/dL (ref 1.5–4.5)
POTASSIUM: 4.8 mmol/L (ref 3.5–5.2)
Sodium: 138 mmol/L (ref 134–144)
Total Protein: 6.6 g/dL (ref 6.0–8.5)

## 2015-09-11 LAB — T4, FREE: FREE T4: 1.08 ng/dL (ref 0.82–1.77)

## 2015-09-11 LAB — TSH: TSH: 2.87 u[IU]/mL (ref 0.450–4.500)

## 2015-09-11 LAB — HEMOGLOBIN A1C
Est. average glucose Bld gHb Est-mCnc: 163 mg/dL
HEMOGLOBIN A1C: 7.3 % — AB (ref 4.8–5.6)

## 2015-09-12 DIAGNOSIS — E785 Hyperlipidemia, unspecified: Secondary | ICD-10-CM | POA: Diagnosis not present

## 2015-09-13 ENCOUNTER — Encounter: Payer: Medicare Other | Attending: Family Medicine | Admitting: Nutrition

## 2015-09-13 VITALS — Ht 71.0 in | Wt 218.0 lb

## 2015-09-13 DIAGNOSIS — E119 Type 2 diabetes mellitus without complications: Secondary | ICD-10-CM | POA: Diagnosis not present

## 2015-09-13 DIAGNOSIS — E118 Type 2 diabetes mellitus with unspecified complications: Secondary | ICD-10-CM

## 2015-09-13 DIAGNOSIS — E1165 Type 2 diabetes mellitus with hyperglycemia: Secondary | ICD-10-CM

## 2015-09-13 LAB — HEPATIC FUNCTION PANEL
ALBUMIN: 4.4 g/dL (ref 3.5–4.8)
ALT: 13 IU/L (ref 0–44)
AST: 19 IU/L (ref 0–40)
Alkaline Phosphatase: 72 IU/L (ref 39–117)
BILIRUBIN TOTAL: 0.4 mg/dL (ref 0.0–1.2)
Bilirubin, Direct: 0.12 mg/dL (ref 0.00–0.40)
Total Protein: 6.5 g/dL (ref 6.0–8.5)

## 2015-09-13 LAB — LIPID PANEL
CHOLESTEROL TOTAL: 146 mg/dL (ref 100–199)
Chol/HDL Ratio: 3 ratio units (ref 0.0–5.0)
HDL: 48 mg/dL (ref >39)
LDL Calculated: 78 mg/dL (ref 0–99)
TRIGLYCERIDES: 101 mg/dL (ref 0–149)
VLDL Cholesterol Cal: 20 mg/dL (ref 5–40)

## 2015-09-13 NOTE — Progress Notes (Signed)
  Medical Nutrition Therapy:  Appt start time: 0930 end time:  1030.  Assessment:  Primary concerns today: Diabetes. Type 2. He lives with his wife. Most recent A1C 7.3%. He is on 1000 mg of Metformin BID and 20 units of Lantus daily. Testing blood sugars 4 times per day. Sees Dr. Dorris Fetch for Endocrinologist. FBS 104-135  Before lunch: Diet is excessive in carbs at some meals. Overall eats fairly healthy meals. Physical activity: walk and does word working as a hobby. His wife does some of the cooking. They eat out some.   Lab Results  Component Value Date   CHOL 146 09/12/2015   HDL 48 09/12/2015   LDLCALC 78 09/12/2015   TRIG 101 09/12/2015   CHOLHDL 3.0 09/12/2015    Lab Results  Component Value Date   HGBA1C 7.3* 09/10/2015    Preferred Learning Style:  No preference indicated    Learning Readiness:   Ready  Change in progress   MEDICATIONS:    DIETARY INTAKE:  24-hr recall:  B ( AM): 2+ cups oatmeal, cinnamon and apple, 1-2 eggs  Snk ( AM):  fruit L ( PM): Soup, water Snk ( PM): misc snack; fruit or cheese/crackers D ( PM): Meat, vegetables, pudding. Snk ( PM): misc Beverages: water  Usual physical activity:  walks  Estimated energy needs: 1800 calories 200 g carbohydrates 135 g protein 50 g fat  Progress Towards Goal(s):  In progress.   Nutritional Diagnosis:  NB-1.1 Food and nutrition-related knowledge deficit As related to DM.  As evidenced by A1C 7.3%..    Intervention:  Nutrition and Diabetes education provided on My Plate, CHO counting, meal planning, portion sizes, timing of meals, avoiding snacks between meals unless having a low blood sugar, target ranges for A1C and blood sugars, signs/symptoms and treatment of hyper/hypoglycemia, monitoring blood sugars, taking medications as prescribed, benefits of exercising 30 minutes per day and prevention of complications of DM.  Goals: 1. Follow My Plate Method 2. Eat 3-4 carb choices per meal. 3.  Increase fresh fruits and vegetables. 4. Exercise 30-60 minutes daily. 5. Increase fiber in diet due to history of heart issues. 6. Avoid snacks between meals unless a low blood sugar. 7. Take meds and test blood sugars as instructed. 8. Get A1C to 6.5% 9. Lose 2-3 lbs per month.    Teaching Method Utilized:  Visual Auditory Hands on  Handouts given during visit include:  My Plate Method  Meal Plan Card  Diabetes Instruction Sheet  Barriers to learning/adherence to lifestyle change: None  Demonstrated degree of understanding via:  Teach Back   Monitoring/Evaluation:  Dietary intake, exercise, meal planning, SBG, and body weight in 1 month(s).

## 2015-09-13 NOTE — Patient Instructions (Signed)
Goals: 1. Follow My Plate Method 2. Eat 3 carb choices per meal. 3. Increase fresh fruits and vegetables. 4. Exercise 30-60 minutes daily. 5. Increase fiber in diet due to history of heart issues. 6. Avoid snacks between meals unless a low blood sugar. 7. Take meds and test blood sugars as instructed. 8. Get A1C to 6.5% 9. Lose 2-3 lbs per month.

## 2015-09-17 ENCOUNTER — Other Ambulatory Visit: Payer: Self-pay | Admitting: Adult Health

## 2015-09-18 ENCOUNTER — Ambulatory Visit (INDEPENDENT_AMBULATORY_CARE_PROVIDER_SITE_OTHER): Payer: Medicare Other | Admitting: "Endocrinology

## 2015-09-18 ENCOUNTER — Encounter: Payer: Self-pay | Admitting: "Endocrinology

## 2015-09-18 VITALS — BP 169/72 | HR 59 | Ht 71.0 in | Wt 217.0 lb

## 2015-09-18 DIAGNOSIS — I10 Essential (primary) hypertension: Secondary | ICD-10-CM

## 2015-09-18 DIAGNOSIS — E785 Hyperlipidemia, unspecified: Secondary | ICD-10-CM

## 2015-09-18 DIAGNOSIS — E1159 Type 2 diabetes mellitus with other circulatory complications: Secondary | ICD-10-CM | POA: Diagnosis not present

## 2015-09-18 MED ORDER — AMLODIPINE BESYLATE 10 MG PO TABS: ORAL_TABLET | ORAL | Status: DC

## 2015-09-18 NOTE — Patient Instructions (Signed)

## 2015-09-18 NOTE — Progress Notes (Signed)
Subjective:    Patient ID: Jerry Burns, male    DOB: Feb 26, 1934. Patient is being seen in consultation for management of diabetes requested by  Sallee Lange, MD  Past Medical History  Diagnosis Date  . Hyperlipidemia   . Stroke ([place])   . Arthritis     Bilateral knee  . Hypertension   . Coronary artery disease   . Anginal pain ([place])   . Diabetes mellitus     insulin dependent   . Progressive angina ([place]) 03/2015    3.0x12 mm Resolute DES to pLAD, Angiosculpt scoring balloon to dLAD   Past Surgical History  Procedure Laterality Date  . Tonsillectomy    . Appendectomy    . Shoulder surgery    . Colonoscopy  2011    RMR: 1. Normal rectum 2. Left sided diverticula , 2 ascending colon polyps, status post snare polypectomy. Remainder of colonic mucosa appeared unremarkable.   . Coronary angioplasty with stent placement  08/22/2014    PTCA/DES X mLAD with 2.5 x 16 mm Promus Premier DES by Dr Julianne Handler  . Foot surgery Right   . Left heart catheterization with coronary angiogram N/A 08/22/2014    Procedure: LEFT HEART CATHETERIZATION WITH CORONARY ANGIOGRAM;  Surgeon: Burnell Blanks, MD;  Location: [facility] CATH LAB;  Service: Cardiovascular;  Laterality: N/A;  . Percutaneous coronary stent intervention (pci-s)  08/22/2014    Procedure: PERCUTANEOUS CORONARY STENT INTERVENTION (PCI-S);  Surgeon: Burnell Blanks, MD;   . Cardiac catheterization N/A 04/12/2015    Procedure: Left Heart Cath and Coronary Angiography;  Surgeon: Sherren Mocha, MD; pLAD 75>0% w/ 3.0x12 mm Resolute DES, dLAD 80>0% w/ Angiosculpt scoring balloon, CFX luminal irreg, RCA mild dz, EF 55-65%    Social History   Social History  . Marital Status: Married    Spouse Name: N/A  . Number of Children: N/A  . Years of Education: 14   Occupational History  . retired    Social History Main Topics  . Smoking status: Former Smoker -- 1.00 packs/day for 20 years    Types: Cigarettes, Cigars    Start  date: 08/10/1955    Quit date: 07/28/2007  . Smokeless tobacco: Never Used  . Alcohol Use: 0.0 oz/week    0 Standard drinks or equivalent per week  . Drug Use: No  . Sexual Activity: Not Asked   Other Topics Concern  . None   Social History Narrative   Outpatient Encounter Prescriptions as of 09/18/2015  Medication Sig  . amLODipine (NORVASC) 10 MG tablet TAKE 1 BY MOUTH DAILY  . atorvastatin (LIPITOR) 80 MG tablet Take 1 tablet (80 mg total) by mouth daily.  . carvedilol (COREG) 6.25 MG tablet TAKE (1) TABLET BY MOUTH TWICE DAILY.  Marland Kitchen clopidogrel (PLAVIX) 75 MG tablet Take 1 tablet (75 mg total) by mouth daily with breakfast.  . dutasteride (AVODART) 0.5 MG capsule TAKE 1 BY MOUTH DAILY  . Insulin Glargine (LANTUS SOLOSTAR) 100 UNIT/ML Solostar Pen Inject 20 Units into the skin daily at 10 pm.  . metFORMIN (GLUCOPHAGE) 1000 MG tablet TAKE 1 BY MOUTH TWICE DAILY WITH MEALS  . Multiple Vitamin (MULTIVITAMIN) tablet Take 1 tablet by mouth daily.  . nitroGLYCERIN (NITROSTAT) 0.4 MG SL tablet PLACE 1 TAB UNDER TONGUE EVERY 5 MIN IF NEEDED FOR CHEST PAIN. MAY USE 3 TIMES.NO RELIEF CALL 911.  . quinapril (ACCUPRIL) 40 MG tablet TAKE 1 BY MOUTH AT BEDTIME  . tamsulosin (FLOMAX) 0.4 MG CAPS capsule  TAKE 1 BY MOUTH DAILY  . tobramycin-dexamethasone (TOBRADEX) ophthalmic solution every 4 (four) hours while awake.  . [DISCONTINUED] amLODipine (NORVASC) 5 MG tablet TAKE 1 BY MOUTH DAILY  . aspirin EC 81 MG tablet Take 81 mg by mouth daily.  Marland Kitchen glucose blood (BAYER CONTOUR TEST) test strip USE TO TEST TWICE DAILY  . mometasone (ELOCON) 0.1 % cream Apply to affected area daily  . psyllium (METAMUCIL) 58.6 % powder Take 1 packet by mouth.  Daryll Brod MINI PEN NEEDLES 31G X 6 MM MISC USE AS DIRECTED   No facility-administered encounter medications on file as of 09/18/2015.   ALLERGIES: Allergies  Allergen Reactions  . Sulfa Antibiotics   . Latex Other (See Comments)    bandaids cause blisters  after 24 hours    VACCINATION STATUS: Immunization History  Administered Date(s) Administered  . Influenza Whole 05/04/2007, 05/01/2008, 05/04/2008  . Influenza,inj,Quad PF,36+ Mos 05/07/2014, 06/06/2015  . Influenza-Unspecified 04/26/2012, 05/17/2013  . Pneumococcal Conjugate-13 01/30/2014  . Pneumococcal Polysaccharide-23 07/27/2006  . Td 05/01/2008, 05/04/2008  . Zoster 07/27/2008    Diabetes He presents for his follow-up diabetic visit. He has type 2 diabetes mellitus. Onset time: He was diagnosed at approximate age of 17 years. His disease course has been stable. There are no hypoglycemic associated symptoms. Pertinent negatives for hypoglycemia include no confusion, headaches, pallor or seizures. There are no diabetic associated symptoms. Pertinent negatives for diabetes include no chest pain, no fatigue, no polydipsia, no polyphagia, no polyuria and no weakness. Symptoms are stable. Diabetic complications include a CVA. Risk factors for coronary artery disease include diabetes mellitus, dyslipidemia, hypertension, male sex, obesity, sedentary lifestyle and tobacco exposure. Current diabetic treatment includes intensive insulin program and oral agent (monotherapy). He is compliant with treatment most of the time. His weight is stable. He is following a generally unhealthy diet. When asked about meal planning, he reported none. He has not had a previous visit with a dietitian. He participates in exercise intermittently. There is no change in his home blood glucose trend. His breakfast blood glucose range is generally 90-110 mg/dl. His lunch blood glucose range is generally 130-140 mg/dl. His dinner blood glucose range is generally 130-140 mg/dl. His overall blood glucose range is 130-140 mg/dl. An ACE inhibitor/angiotensin II receptor blocker is being taken. Eye exam is current.  Hyperlipidemia This is a chronic problem. The current episode started more than 1 year ago. The problem is  controlled. Exacerbating diseases include diabetes and obesity. Pertinent negatives include no chest pain, myalgias or shortness of breath. Current antihyperlipidemic treatment includes statins. Risk factors for coronary artery disease include diabetes mellitus, dyslipidemia, hypertension, male sex and obesity.  Hypertension This is a chronic problem. The current episode started more than 1 year ago. The problem is controlled. Pertinent negatives include no chest pain, headaches, neck pain, palpitations or shortness of breath. Risk factors for coronary artery disease include diabetes mellitus, dyslipidemia, obesity and smoking/tobacco exposure. Past treatments include ACE inhibitors. Hypertensive end-organ damage includes CAD/MI and CVA.    Review of Systems  Constitutional: Negative for fever, chills, fatigue and unexpected weight change.  HENT: Negative for dental problem, mouth sores and trouble swallowing.   Eyes: Negative for visual disturbance.  Respiratory: Negative for cough, choking, chest tightness, shortness of breath and wheezing.   Cardiovascular: Negative for chest pain, palpitations and leg swelling.  Gastrointestinal: Negative for nausea, vomiting, abdominal pain, diarrhea, constipation and abdominal distention.  Endocrine: Negative for polydipsia, polyphagia and polyuria.  Genitourinary: Negative for  dysuria, urgency, hematuria and flank pain.  Musculoskeletal: Negative for myalgias, back pain, gait problem and neck pain.  Skin: Negative for pallor, rash and wound.  Neurological: Negative for seizures, syncope, weakness, numbness and headaches.  Psychiatric/Behavioral: Negative.  Negative for confusion and dysphoric mood.    Objective:    BP 169/72 mmHg  Pulse 59  Ht 5\' 11"  (1.803 m)  Wt 217 lb (98.431 kg)  BMI 30.28 kg/m2  SpO2 97%  Wt Readings from Last 3 Encounters:  09/18/15 217 lb (98.431 kg)  09/13/15 218 lb (98.884 kg)  09/09/15 219 lb (99.338 kg)    Physical  Exam  Constitutional: He is oriented to person, place, and time. He appears well-developed and well-nourished. He is cooperative. No distress.  HENT:  Head: Normocephalic and atraumatic.  Eyes: EOM are normal.  Neck: Normal range of motion. Neck supple. No tracheal deviation present. No thyromegaly present.  Cardiovascular: Normal rate, S1 normal, S2 normal and normal heart sounds.  Exam reveals no gallop.   No murmur heard. Pulses:      Dorsalis pedis pulses are 1+ on the right side, and 1+ on the left side.       Posterior tibial pulses are 1+ on the right side, and 1+ on the left side.  Pulmonary/Chest: Breath sounds normal. No respiratory distress. He has no wheezes.  Abdominal: Soft. Bowel sounds are normal. He exhibits no distension. There is no tenderness. There is no guarding and no CVA tenderness.  Musculoskeletal: He exhibits no edema.       Right shoulder: He exhibits no swelling and no deformity.  Neurological: He is alert and oriented to person, place, and time. He has normal strength and normal reflexes. No cranial nerve deficit or sensory deficit. Gait normal.  Skin: Skin is warm and dry. No rash noted. No cyanosis. Nails show no clubbing.  Psychiatric: He has a normal mood and affect. His speech is normal and behavior is normal. Judgment and thought content normal. Cognition and memory are normal.    CMP     Component Value Date/Time   NA 138 09/10/2015 0817   NA 137 04/13/2015 0424   K 4.8 09/10/2015 0817   CL 99 09/10/2015 0817   CO2 24 09/10/2015 0817   GLUCOSE 135* 09/10/2015 0817   GLUCOSE 216* 04/13/2015 0424   BUN 25 09/10/2015 0817   BUN 19 04/13/2015 0424   CREATININE 1.08 09/10/2015 0817   CREATININE 0.93 04/23/2014 0704   CALCIUM 9.5 09/10/2015 0817   PROT 6.5 09/12/2015 0713   PROT 6.4 04/23/2014 0704   ALBUMIN 4.4 09/12/2015 0713   ALBUMIN 4.0 04/23/2014 0704   AST 19 09/12/2015 0713   ALT 13 09/12/2015 0713   ALKPHOS 72 09/12/2015 0713   BILITOT  0.4 09/12/2015 0713   BILITOT 0.5 04/23/2014 0704   GFRNONAA 65 09/10/2015 0817   GFRAA 75 09/10/2015 0817     Diabetic Labs (most recent): Lab Results  Component Value Date   HGBA1C 7.3* 09/10/2015   HGBA1C 7.2* 05/28/2015   HGBA1C 7.4* 02/27/2015    Lipid Panel     Component Value Date/Time   CHOL 146 09/12/2015 0713   CHOL 155 07/31/2014 0703   TRIG 101 09/12/2015 0713   HDL 48 09/12/2015 0713   HDL 53 07/31/2014 0703   CHOLHDL 3.0 09/12/2015 0713   CHOLHDL 2.9 07/31/2014 0703   VLDL 19 07/31/2014 0703   LDLCALC 78 09/12/2015 0713   LDLCALC 83 07/31/2014 0703  Assessment & Plan:   1. DM type 2 causing vascular disease ([place]) - Patient has currently uncontrolled asymptomatic type 2 DM since  82 years of age,  with most recent A1c of 7.3 %. Recent labs reviewed, showing normal renal function.   His diabetes is complicated by coronary artery disease, CVA and patient remains at a high risk for more acute and chronic complications of diabetes which include CAD, CVA, CKD, retinopathy, and neuropathy. These are all discussed in detail with the patient.  - I have counseled the patient on diet management and weight loss, by adopting a carbohydrate restricted/protein rich diet.  - Suggestion is made for patient to avoid simple carbohydrates   from their diet including Cakes , Desserts, Ice Cream,  Soda (  diet and regular) , Sweet Tea , Candies,  Chips, Cookies, Artificial Sweeteners,   and "Sugar-free" Products . This will help patient to have stable blood glucose profile and potentially avoid unintended weight gain.  - I encouraged the patient to switch to  unprocessed or minimally processed complex starch and increased protein intake (animal or plant source), fruits, and vegetables.  - Patient is advised to stick to a routine mealtimes to eat 3 meals  a day and avoid unnecessary snacks ( to snack only to correct hypoglycemia).  - The patient will be scheduled with Jearld Fenton, RDN, CDE for individualized DM education.  - I have approached patient with the following individualized plan to manage diabetes and patient agrees:   - I  will proceed with basal insulin Lantus 20 units QHS, and hold his prandial insulin humalog  for now.  -He will continue to monitor blood glucose before breakfast and as needed. -Patient is encouraged to call clinic for blood glucose levels less than 70 or above 300 mg /dl. - I will continue metformin 1000 mg by mouth twice a day, therapeutically suitable for patient.  - Patient will be considered for incretin therapy as appropriate next visit. - Patient specific target  A1c;  LDL, HDL, Triglycerides, and  Waist Circumference were discussed in detail.  2) BP/HTN: unControlled.  I advised him to take his carvedilol 6.25 mg twice a day and increase amlodipine to 10 mg by mouth daily along with his other  medications including ACEI/ARB. 3) Lipids/HPL:  Controlled, continue statins. 4)  Weight/Diet: CDE Consult will be initiated , exercise, and detailed carbohydrates information provided.  5) Chronic Care/Health Maintenance:  -Patient is on ACEI/ARB and Statin medications and encouraged to continue to follow up with Ophthalmology, Podiatrist at least yearly or according to recommendations, and advised to   stay away from smoking. I have recommended yearly flu vaccine and pneumonia vaccination at least every 5 years; moderate intensity exercise for up to 150 minutes weekly; and  sleep for at least 7 hours a day.  - 25 minutes of time was spent on the care of this patient , 50% of which was applied for counseling on diabetes complications and their preventions.  - Patient to bring meter and  blood glucose logs during their next visit.   - I advised patient to maintain close follow up with Sallee Lange, MD for primary care needs.  Follow up plan: - Return in about 3 months (around 12/16/2015) for diabetes, high blood pressure, high  cholesterol, follow up with pre-visit labs, meter, and logs.  Jerry Lloyd, MD Phone: [phone]  Fax: [phone]   09/18/2015, 10:11 AM

## 2015-09-25 ENCOUNTER — Ambulatory Visit (INDEPENDENT_AMBULATORY_CARE_PROVIDER_SITE_OTHER): Payer: Medicare Other | Admitting: Family Medicine

## 2015-09-25 ENCOUNTER — Encounter: Payer: Self-pay | Admitting: Family Medicine

## 2015-09-25 VITALS — BP 110/68 | Ht 71.0 in | Wt 213.0 lb

## 2015-09-25 DIAGNOSIS — H01006 Unspecified blepharitis left eye, unspecified eyelid: Secondary | ICD-10-CM | POA: Diagnosis not present

## 2015-09-25 DIAGNOSIS — I10 Essential (primary) hypertension: Secondary | ICD-10-CM | POA: Diagnosis not present

## 2015-09-25 NOTE — Progress Notes (Signed)
   Subjective:    Patient ID: Jerry Burns, male    DOB: 02/15/1934, 82 y.o.   MRN: [id number]  Hyperlipidemia This is a chronic problem. The current episode started more than 1 year ago.   Recurring red spot on chin that burns. Uses mometason furoate cream 0.1%.  Sensitive right great toe. Has had this problems for years.   Discuss eye issue. Wants referral to someone else. Does not like who he is seeing.   Sees Dr. Dorris Fetch for diabetes. Dr. Dorris Fetch increased amlodipine from 5mg  to 10mg .   Review of Systems  denies chest tightness pressure pain shortness breath nausea vomiting diarrhea    Objective:   Physical Exam   lungs are clear hearts regular foot exam normal does have dry skin. Blood pressure good. Has chronic blepharitis left eye      Assessment & Plan:   blepharitis patient would like to get second opinion we will try to get ophthalmology associated with [place] per patient's request   Blood pressure good control   diabetes under the care of specialists

## 2015-10-09 ENCOUNTER — Telehealth: Payer: Self-pay | Admitting: Family Medicine

## 2015-10-09 NOTE — Telephone Encounter (Signed)
This patient checks his sugar frequently he is followed by Dr. Dorris Fetch. He is on insulin 4 times per day he also has fluctuating sugars his long as have been scanned into our system. [facility] is requesting additional information please assist them with this this is necessary for Medicare to pay for a gentleman strips thank you

## 2015-10-17 DIAGNOSIS — H02135 Senile ectropion of left lower eyelid: Secondary | ICD-10-CM | POA: Diagnosis not present

## 2015-10-17 DIAGNOSIS — H02115 Cicatricial ectropion of left lower eyelid: Secondary | ICD-10-CM | POA: Diagnosis not present

## 2015-10-17 DIAGNOSIS — H02535 Eyelid retraction left lower eyelid: Secondary | ICD-10-CM | POA: Diagnosis not present

## 2015-10-25 ENCOUNTER — Ambulatory Visit (INDEPENDENT_AMBULATORY_CARE_PROVIDER_SITE_OTHER): Payer: Medicare Other | Admitting: Adult Health

## 2015-10-25 ENCOUNTER — Encounter: Payer: Self-pay | Admitting: Adult Health

## 2015-10-25 VITALS — BP 136/66 | HR 62 | Ht 71.0 in | Wt 215.0 lb

## 2015-10-25 DIAGNOSIS — E7800 Pure hypercholesterolemia, unspecified: Secondary | ICD-10-CM | POA: Diagnosis not present

## 2015-10-25 DIAGNOSIS — I10 Essential (primary) hypertension: Secondary | ICD-10-CM | POA: Diagnosis not present

## 2015-10-25 DIAGNOSIS — I2510 Atherosclerotic heart disease of native coronary artery without angina pectoris: Secondary | ICD-10-CM | POA: Diagnosis not present

## 2015-10-25 MED ORDER — ATORVASTATIN CALCIUM 80 MG PO TABS: 80.0000 mg | ORAL_TABLET | Freq: Every day | ORAL | Status: DC

## 2015-10-25 MED ORDER — AMLODIPINE BESYLATE 10 MG PO TABS: ORAL_TABLET | ORAL | Status: DC

## 2015-10-25 MED ORDER — CARVEDILOL 6.25 MG PO TABS: ORAL_TABLET | ORAL | Status: DC

## 2015-10-25 NOTE — Progress Notes (Deleted)
Name: Jerry Burns    DOB: [birth year]  Age: 82 y.o.  MR#: [id number]       PCP:  Sallee Lange, MD      Insurance: Payor: MEDICARE / Plan: MEDICARE PART A AND B / Product Type: *No Product type* /   CC:   No chief complaint on file.   VS Filed Vitals:   10/25/15 1347  BP: 136/66  Pulse: 62  Height: 5\' 11"  (1.803 m)  Weight: 215 lb (97.523 kg)  SpO2: 97%    Weights Current Weight  10/25/15 215 lb (97.523 kg)  09/25/15 213 lb (96.616 kg)  09/18/15 217 lb (98.431 kg)    Blood Pressure  BP Readings from Last 3 Encounters:  10/25/15 136/66  09/25/15 110/68  09/18/15 169/72     Admit date:  (Not on file) Last encounter with RMR:  09/17/2015   Allergy Sulfa antibiotics and Latex  Current Outpatient Prescriptions  Medication Sig Dispense Refill  . amLODipine (NORVASC) 10 MG tablet TAKE 1 BY MOUTH DAILY 30 tablet 3  . aspirin EC 81 MG tablet Take 81 mg by mouth daily.    Marland Kitchen atorvastatin (LIPITOR) 80 MG tablet Take 1 tablet (80 mg total) by mouth daily. 90 tablet 3  . carvedilol (COREG) 6.25 MG tablet TAKE (1) TABLET BY MOUTH TWICE DAILY. 180 tablet 1  . clopidogrel (PLAVIX) 75 MG tablet Take 1 tablet (75 mg total) by mouth daily with breakfast. 30 tablet 11  . COMBIGAN 0.2-0.5 % ophthalmic solution   2  . dutasteride (AVODART) 0.5 MG capsule TAKE 1 BY MOUTH DAILY 90 capsule 1  . glucose blood (BAYER CONTOUR TEST) test strip USE TO TEST TWICE DAILY 100 each 1  . Insulin Glargine (LANTUS SOLOSTAR) 100 UNIT/ML Solostar Pen Inject 20 Units into the skin daily at 10 pm. 30 mL 2  . metFORMIN (GLUCOPHAGE) 1000 MG tablet TAKE 1 BY MOUTH TWICE DAILY WITH MEALS 180 tablet 1  . mometasone (ELOCON) 0.1 % cream Apply to affected area daily 45 g 1  . Multiple Vitamin (MULTIVITAMIN) tablet Take 1 tablet by mouth daily.    . nitroGLYCERIN (NITROSTAT) 0.4 MG SL tablet PLACE 1 TAB UNDER TONGUE EVERY 5 MIN IF NEEDED FOR CHEST PAIN. MAY USE 3 TIMES.NO RELIEF CALL 911. 25 tablet 3  . psyllium  (METAMUCIL) 58.6 % powder Take 1 packet by mouth.    . quinapril (ACCUPRIL) 40 MG tablet TAKE 1 BY MOUTH AT BEDTIME 90 tablet 0  . RELION MINI PEN NEEDLES 31G X 6 MM MISC USE AS DIRECTED 100 each 0  . tamsulosin (FLOMAX) 0.4 MG CAPS capsule TAKE 1 BY MOUTH DAILY 90 capsule 1   No current facility-administered medications for this visit.    Discontinued Meds:   There are no discontinued medications.  Patient Active Problem List   Diagnosis Date Noted  . Exertional angina ([place]) 04/12/2015  . History of colonic polyps 11/21/2014  . ASCVD (arteriosclerotic cardiovascular disease) 09/03/2014  . Unstable angina ([place]) 08/22/2014  . DM type 2 causing vascular disease ([place]) 08/08/2014  . Chest pain on exertion 07/23/2014  . Rotator cuff syndrome of right shoulder 12/16/2011  . Shoulder pain, right 12/16/2011  . HYPERKALEMIA 01/31/2009  . KNEE PAIN, LEFT 07/31/2008  . DIABETIC FOOT ULCER, TOE 12/23/2007  . BENIGN PROSTATIC HYPERTROPHY 01/26/2007  . URINARY TRACT INFECTION 01/12/2007  . URINARY RETENTION 01/12/2007  . ERECTILE DYSFUNCTION 11/01/2006  . Hyperlipidemia 08/04/2006  . Essential hypertension 08/04/2006  . OSTEOARTHRITIS  08/04/2006    LABS    Component Value Date/Time   NA 138 09/10/2015 0817   NA 137 04/13/2015 0424   NA 138 04/12/2015 0650   NA 139 02/27/2015 0804   NA 138 11/02/2014 0806   NA 137 08/23/2014 0525   K 4.8 09/10/2015 0817   K 4.2 04/13/2015 0424   K 4.4 04/12/2015 0650   CL 99 09/10/2015 0817   CL 103 04/13/2015 0424   CL 106 04/12/2015 0650   CO2 24 09/10/2015 0817   CO2 28 04/13/2015 0424   CO2 24 04/12/2015 0650   GLUCOSE 135* 09/10/2015 0817   GLUCOSE 216* 04/13/2015 0424   GLUCOSE 201* 04/12/2015 0650   GLUCOSE 193* 02/27/2015 0804   GLUCOSE 198* 11/02/2014 0806   GLUCOSE 172* 08/23/2014 0525   BUN 25 09/10/2015 0817   BUN 19 04/13/2015 0424   BUN 28* 04/12/2015 0650   BUN 21 02/27/2015 0804   BUN 28* 11/02/2014 0806   BUN 14  08/23/2014 0525   CREATININE 1.08 09/10/2015 0817   CREATININE 0.98 04/13/2015 0424   CREATININE 0.97 04/12/2015 0650   CREATININE 0.93 04/23/2014 0704   CREATININE 0.89 10/26/2013 0712   CREATININE 1.01 12/21/2012 0720   CALCIUM 9.5 09/10/2015 0817   CALCIUM 9.2 04/13/2015 0424   CALCIUM 9.2 04/12/2015 0650   GFRNONAA 65 09/10/2015 0817   GFRNONAA >60 04/13/2015 0424   GFRNONAA >60 04/12/2015 0650   GFRAA 75 09/10/2015 0817   GFRAA >60 04/13/2015 0424   GFRAA >60 04/12/2015 0650   CMP     Component Value Date/Time   NA 138 09/10/2015 0817   NA 137 04/13/2015 0424   K 4.8 09/10/2015 0817   CL 99 09/10/2015 0817   CO2 24 09/10/2015 0817   GLUCOSE 135* 09/10/2015 0817   GLUCOSE 216* 04/13/2015 0424   BUN 25 09/10/2015 0817   BUN 19 04/13/2015 0424   CREATININE 1.08 09/10/2015 0817   CREATININE 0.93 04/23/2014 0704   CALCIUM 9.5 09/10/2015 0817   PROT 6.5 09/12/2015 0713   PROT 6.4 04/23/2014 0704   ALBUMIN 4.4 09/12/2015 0713   ALBUMIN 4.0 04/23/2014 0704   AST 19 09/12/2015 0713   ALT 13 09/12/2015 0713   ALKPHOS 72 09/12/2015 0713   BILITOT 0.4 09/12/2015 0713   BILITOT 0.5 04/23/2014 0704   GFRNONAA 65 09/10/2015 0817   GFRAA 75 09/10/2015 0817       Component Value Date/Time   WBC 5.0 04/13/2015 0424   WBC 4.5 04/12/2015 0650   WBC 5.3 08/23/2014 0525   HGB 12.2* 04/13/2015 0424   HGB 11.5* 04/12/2015 0650   HGB 13.2 08/23/2014 0525   HCT 36.0* 04/13/2015 0424   HCT 33.8* 04/12/2015 0650   HCT 38.8* 08/23/2014 0525   MCV 88.9 04/13/2015 0424   MCV 88.7 04/12/2015 0650   MCV 88.4 08/23/2014 0525    Lipid Panel     Component Value Date/Time   CHOL 146 09/12/2015 0713   CHOL 155 07/31/2014 0703   TRIG 101 09/12/2015 0713   HDL 48 09/12/2015 0713   HDL 53 07/31/2014 0703   CHOLHDL 3.0 09/12/2015 0713   CHOLHDL 2.9 07/31/2014 0703   VLDL 19 07/31/2014 0703   LDLCALC 78 09/12/2015 0713   LDLCALC 83 07/31/2014 0703    ABG No results found for:  PHART, PCO2ART, PO2ART, HCO3, TCO2, ACIDBASEDEF, O2SAT   Lab Results  Component Value Date   TSH 2.870 09/10/2015   BNP (last 3 results)  No results for input(s): BNP in the last 8760 hours.  ProBNP (last 3 results) No results for input(s): PROBNP in the last 8760 hours.  Cardiac Panel (last 3 results) No results for input(s): CKTOTAL, CKMB, TROPONINI, RELINDX in the last 72 hours.  Iron/TIBC/Ferritin/ %Sat No results found for: IRON, TIBC, FERRITIN, IRONPCTSAT   EKG Orders placed or performed during the hospital encounter of 04/12/15  . EKG 12-Lead  . EKG 12-Lead  . EKG 12-Lead immediately post procedure  . EKG 12-Lead  . EKG 12-Lead immediately post procedure  . EKG 12-Lead  . EKG 12-Lead  . EKG 12-Lead  . EKG     Prior Assessment and Plan Problem List as of 10/25/2015      Cardiovascular and Mediastinum   Essential hypertension   Last Assessment & Plan 08/09/2014 Office Visit Written 08/09/2014  2:23 PM by Lendon Colonel, NP    Currently well-controlled.  Is medically compliant.  No changes in medication regimen.  Await cardiac catheterization results for any adjustments, which may be necessary.      DM type 2 causing vascular disease ([place])   Unstable angina ([facility])   ASCVD (arteriosclerotic cardiovascular disease)   Exertional angina ([facility])     Endocrine   DIABETIC FOOT ULCER, TOE     Musculoskeletal and Integument   OSTEOARTHRITIS   Rotator cuff syndrome of right shoulder     Genitourinary   URINARY TRACT INFECTION   BENIGN PROSTATIC HYPERTROPHY   URINARY RETENTION     Other   Hyperlipidemia   HYPERKALEMIA   ERECTILE DYSFUNCTION   KNEE PAIN, LEFT   Shoulder pain, right   Chest pain on exertion   Last Assessment & Plan 08/09/2014 Office Visit Written 08/09/2014  2:24 PM by Lendon Colonel, NP    As stated, he had a high-risk abnormal nuclear medicine stress test read by Dr. Harl Bowie on 1/ 7/ 2016. As a result of these findings.  I discussed with the  patient and Dr. Domenic Polite, who is in agreement, but the patient will need to have a cardiac catheterization and definitive evaluation of coronary anatomy.  I discussed the risks and benefits, possibility of intervention if necessary.  He verbalizes understanding and is willing to proceed.  All questions and concerns were answered.  His plan to have a cardiac catheterization on Monday, January 18th, 2016, with Dr. Martinique.  He is provided precardiac catheterization instructions, and all questions have been answered.      History of colonic polyps   Last Assessment & Plan 11/21/2014 Office Visit Written 11/21/2014 11:43 AM by Carlis Stable, NP    Repeat surveillance colonoscopy. Last colonoscopy was 5 years ago with diffuse tubular adenoma polyps removed. Recommended repeat in 5 years. In that time frame however the patient has had a cardiac catheterization with a drug-eluting stent placement approximately 3 months ago and is currently on Plavix. Also admits some residual morning chest pain. He is due to be seen by cardiology in approximately 2-3 weeks. Given his current Plavix anticoagulation and potential residual chest pain with atherosclerotic coronary disease we will hold off on colonoscopy right now until he can be seen and cleared by cardiology. Additionally, per recommendations, Plavix medication should not be held for a minimum of 6 months after DES placement. I will consult with Dr. Gala Romney to find out when he is most comfortable with proceeding with a surveillance colonoscopy with possible polypectomy on Plavix, and notify the patient. Follow-up appointment tentatively scheduled for 3  months.          Imaging: No results found.

## 2015-10-25 NOTE — Patient Instructions (Signed)
Your physician wants you to follow-up in: 6 Months with Dr. Bronson Ing. You will receive a reminder letter in the mail two months in advance. If you don't receive a letter, please call our office to schedule the follow-up appointment.  Your physician recommends that you continue on your current medications as directed. Please refer to the Current Medication list given to you today.  If you need a refill on your cardiac medications before your next appointment, please call your pharmacy.  You Have been cleared from a Cardiac standpoint for your Colonoscopy in September 2017.   Thank you for choosing [facility]!

## 2015-10-25 NOTE — Progress Notes (Signed)
Cardiology Office Note   Date:  10/25/2015   ID:  Jerry Burns, DOB 1933-12-09, MRN [id number]  PCP:  Sallee Lange, MD  Cardiologist:  Woodroe Chen, NP   No chief complaint on file.     History of Present Illness: Jerry Burns is a 82 y.o. male who presents for ongoing assessment and management of coronary artery disease, with history of drug-eluting stent to the mid LAD in January 2016, hypertension.  He had repeat cardiac catheterization September 2016, revealing single-vessel CAD involving the proximal LAD distal LAD with severe in-stent restenosis in that segment.  He underwent an additional PCI of the LAD with drug-eluting stent.he was to continue on an additional year of a dual antiplatelet therapy, and to participate in cardiac rehabilitation.  He was last seen in the office in September 2016, and was stable from a cardiac standpoint. He is here for six-month followup.  He has since surgery is on a, with significant erythema, edema, and drainage.  He is doing better, and has continued his medications without stopping, perioperatively.  He denies any chest pain walking up the hill in his home.  Does not cause him dyspnea as it had in the past before re-PCI.  He is energetic and thankful for our care.  Past Medical History  Diagnosis Date  . Hyperlipidemia   . Stroke ([place])   . Arthritis     Bilateral knee  . Hypertension   . Coronary artery disease   . Anginal pain ([place])   . Diabetes mellitus     insulin dependent   . Progressive angina ([place]) 03/2015    3.0x12 mm Resolute DES to pLAD, Angiosculpt scoring balloon to dLAD    Past Surgical History  Procedure Laterality Date  . Tonsillectomy    . Appendectomy    . Shoulder surgery    . Colonoscopy  2011    RMR: 1. Normal rectum 2. Left sided diverticula , 2 ascending colon polyps, status post snare polypectomy. Remainder of colonic mucosa appeared unremarkable.   . Coronary angioplasty with stent  placement  08/22/2014    PTCA/DES X mLAD with 2.5 x 16 mm Promus Premier DES by Dr Julianne Handler  . Foot surgery Right   . Left heart catheterization with coronary angiogram N/A 08/22/2014    Procedure: LEFT HEART CATHETERIZATION WITH CORONARY ANGIOGRAM;  Surgeon: Burnell Blanks, MD;  Location[facility] CATH LAB;  Service: Cardiovascular;  Laterality: N/A;  . Percutaneous coronary stent intervention (pci-s)  08/22/2014    Procedure: PERCUTANEOUS CORONARY STENT INTERVENTION (PCI-S);  Surgeon: Burnell Blanks, MD;   . Cardiac catheterization N/A 04/12/2015    Procedure: Left Heart Cath and Coronary Angiography;  Surgeon: Sherren Mocha, MD; pLAD 75>0% w/ 3.0x12 mm Resolute DES, dLAD 80>0% w/ Angiosculpt scoring balloon, CFX luminal irreg, RCA mild dz, EF 55-65%      Current Outpatient Prescriptions  Medication Sig Dispense Refill  . amLODipine (NORVASC) 10 MG tablet TAKE 1 BY MOUTH DAILY 30 tablet 3  . aspirin EC 81 MG tablet Take 81 mg by mouth daily.    Marland Kitchen atorvastatin (LIPITOR) 80 MG tablet Take 1 tablet (80 mg total) by mouth daily. 90 tablet 3  . carvedilol (COREG) 6.25 MG tablet TAKE (1) TABLET BY MOUTH TWICE DAILY. 180 tablet 1  . clopidogrel (PLAVIX) 75 MG tablet Take 1 tablet (75 mg total) by mouth daily with breakfast. 30 tablet 11  . COMBIGAN 0.2-0.5 % ophthalmic solution   2  . dutasteride (  AVODART) 0.5 MG capsule TAKE 1 BY MOUTH DAILY 90 capsule 1  . glucose blood (BAYER CONTOUR TEST) test strip USE TO TEST TWICE DAILY 100 each 1  . Insulin Glargine (LANTUS SOLOSTAR) 100 UNIT/ML Solostar Pen Inject 20 Units into the skin daily at 10 pm. 30 mL 2  . metFORMIN (GLUCOPHAGE) 1000 MG tablet TAKE 1 BY MOUTH TWICE DAILY WITH MEALS 180 tablet 1  . mometasone (ELOCON) 0.1 % cream Apply to affected area daily 45 g 1  . Multiple Vitamin (MULTIVITAMIN) tablet Take 1 tablet by mouth daily.    . nitroGLYCERIN (NITROSTAT) 0.4 MG SL tablet PLACE 1 TAB UNDER TONGUE EVERY 5 MIN IF NEEDED FOR CHEST  PAIN. MAY USE 3 TIMES.NO RELIEF CALL 911. 25 tablet 3  . psyllium (METAMUCIL) 58.6 % powder Take 1 packet by mouth.    . quinapril (ACCUPRIL) 40 MG tablet TAKE 1 BY MOUTH AT BEDTIME 90 tablet 0  . RELION MINI PEN NEEDLES 31G X 6 MM MISC USE AS DIRECTED 100 each 0  . tamsulosin (FLOMAX) 0.4 MG CAPS capsule TAKE 1 BY MOUTH DAILY 90 capsule 1   No current facility-administered medications for this visit.    Allergies:   Sulfa antibiotics and Latex    Social History:  The patient  reports that he quit smoking about 8 years ago. His smoking use included Cigarettes and Cigars. He started smoking about 60 years ago. He has a 20 pack-year smoking history. He has never used smokeless tobacco. He reports that he drinks alcohol. He reports that he does not use illicit drugs.   Family History:  The patient's family history includes Colon cancer in his paternal uncle; Diabetes in his brother.    ROS: All other systems are reviewed and negative. Unless otherwise mentioned in H&P    PHYSICAL EXAM: VS:  BP 136/66 mmHg  Pulse 62  Ht 5\' 11"  (1.803 m)  Wt 215 lb (97.523 kg)  BMI 30.00 kg/m2  SpO2 97% , BMI Body mass index is 30 kg/(m^2). GEN: Well nourished, well developed, in no acute distress HEENT: significantly erythematous, left eye, with mild edema, and drainage postoperatively.  No evidence of infection. Neck: no JVD, carotid bruits, or masses Cardiac: RRR; no murmurs, rubs, or gallops,no edema  Respiratory:  clear to auscultation bilaterally, normal work of breathing GI: soft, nontender, nondistended, + BS MS: no deformity or atrophy Skin: warm and dry, no rash Neuro:  Strength and sensation are intact Psych: euthymic mood, full affect   Recent Labs: 04/13/2015: Hemoglobin 12.2*; Platelets 192 09/10/2015: BUN 25; Creatinine, Ser 1.08; Potassium 4.8; Sodium 138; TSH 2.870 09/12/2015: ALT 13    Lipid Panel    Component Value Date/Time   CHOL 146 09/12/2015 0713   CHOL 155 07/31/2014  0703   TRIG 101 09/12/2015 0713   HDL 48 09/12/2015 0713   HDL 53 07/31/2014 0703   CHOLHDL 3.0 09/12/2015 0713   CHOLHDL 2.9 07/31/2014 0703   VLDL 19 07/31/2014 0703   LDLCALC 78 09/12/2015 0713   LDLCALC 83 07/31/2014 0703      Wt Readings from Last 3 Encounters:  10/25/15 215 lb (97.523 kg)  09/25/15 213 lb (96.616 kg)  09/18/15 217 lb (98.431 kg)      ASSESSMENT AND PLAN:  1. CAD: He will continue dual antiplatelet therapy, at least until September of 2017.  At which point we can discuss taking him off Plavix.  He is due to have a colonoscopy, but is waiting until  September to be able to move forward with this off of Plavix.  He denies chest pain or dyspnea on exertion, which is his normal anginal equivalent.  He gages despite being able to walk on his Lantus to a gait without discomfort or dyspnea.  He is doing well on this account and continues his usual daily activities.he is to continue his current medication regimen without changes.  2. Hypertension:blood pressures currently well-controlled.  He will continue carvedilol and quinapril.  3. Hypercholesterolemia: Continue statin therapy.  He will followup with labs with PCP or in our office if not completed in 6 months.   Current medicines are reviewed at length with the patient today.    Labs/ tests ordered today include:  No orders of the defined types were placed in this encounter.     Disposition:   FU with 6 months unless symptomatic. Signed, Jory Sims, NP  10/25/2015 1:58 PM    [facility] [address], [place], [place] [address] Phone: [phone]; Fax: [phone]

## 2015-10-30 ENCOUNTER — Other Ambulatory Visit: Payer: Self-pay

## 2015-10-30 MED ORDER — QUINAPRIL HCL 40 MG PO TABS: ORAL_TABLET | ORAL | Status: DC

## 2015-11-04 DIAGNOSIS — S0010XD Contusion of unspecified eyelid and periocular area, subsequent encounter: Secondary | ICD-10-CM | POA: Diagnosis not present

## 2015-11-12 ENCOUNTER — Encounter: Payer: Self-pay | Admitting: Internal Medicine

## 2015-11-13 ENCOUNTER — Other Ambulatory Visit: Payer: Self-pay | Admitting: Adult Health

## 2015-11-18 ENCOUNTER — Telehealth: Payer: Self-pay | Admitting: Internal Medicine

## 2015-11-18 NOTE — Telephone Encounter (Signed)
Randall Hiss, please see ov note from Cardiology, they are going to see pt in Sept about stopping plavix. Is it ok for the pt to wait?

## 2015-11-18 NOTE — Telephone Encounter (Signed)
Last TCS 2011 with polyps recommend 5 year surveillance (Due 10/2014).   Is he having any urgent symptoms (gi bleeding, sudden changes in bowel habits, etc?)  If not, ok to wait. If so, I'll have to look at his chart again

## 2015-11-18 NOTE — Telephone Encounter (Signed)
Pt received a letter that he needs to make OV for tcs, but said that he was still on Plavix and RMR would not do procedure if he was on Plavix, He said that he could wait until September, but didn't know if he still needed to come in or wait. Please advise. [phone]

## 2015-11-19 NOTE — Telephone Encounter (Signed)
Pt said he was doing ok at this time.  Please nic for September ov.

## 2015-11-19 NOTE — Telephone Encounter (Signed)
NIC'ED

## 2015-12-09 ENCOUNTER — Telehealth: Payer: Self-pay | Admitting: "Endocrinology

## 2015-12-09 NOTE — Telephone Encounter (Signed)
pt wants to go to [facility] - can you please switch his labs from [facility] to [facility]

## 2015-12-09 NOTE — Telephone Encounter (Signed)
Will fax lab order to [facility]

## 2015-12-10 ENCOUNTER — Other Ambulatory Visit: Payer: Self-pay | Admitting: "Endocrinology

## 2015-12-10 DIAGNOSIS — E1159 Type 2 diabetes mellitus with other circulatory complications: Secondary | ICD-10-CM | POA: Diagnosis not present

## 2015-12-11 LAB — HGB A1C W/O EAG: HEMOGLOBIN A1C: 7.3 % — AB (ref 4.8–5.6)

## 2015-12-11 LAB — BASIC METABOLIC PANEL
BUN/Creatinine Ratio: 26 — ABNORMAL HIGH (ref 10–24)
BUN: 25 mg/dL (ref 8–27)
CALCIUM: 9.5 mg/dL (ref 8.6–10.2)
CHLORIDE: 100 mmol/L (ref 96–106)
CO2: 22 mmol/L (ref 18–29)
CREATININE: 0.96 mg/dL (ref 0.76–1.27)
GFR calc non Af Amer: 74 mL/min/{1.73_m2} (ref >59)
GFR, EST AFRICAN AMERICAN: 86 mL/min/{1.73_m2} (ref >59)
GLUCOSE: 174 mg/dL — AB (ref 65–99)
Potassium: 4.9 mmol/L (ref 3.5–5.2)
Sodium: 140 mmol/L (ref 134–144)

## 2015-12-16 ENCOUNTER — Ambulatory Visit: Payer: Medicare Other | Admitting: "Endocrinology

## 2015-12-17 ENCOUNTER — Encounter: Payer: Self-pay | Admitting: "Endocrinology

## 2015-12-17 ENCOUNTER — Ambulatory Visit (INDEPENDENT_AMBULATORY_CARE_PROVIDER_SITE_OTHER): Payer: Medicare Other | Admitting: "Endocrinology

## 2015-12-17 VITALS — BP 136/74 | HR 63 | Ht 71.0 in | Wt 214.0 lb

## 2015-12-17 DIAGNOSIS — I10 Essential (primary) hypertension: Secondary | ICD-10-CM

## 2015-12-17 DIAGNOSIS — E1159 Type 2 diabetes mellitus with other circulatory complications: Secondary | ICD-10-CM | POA: Diagnosis not present

## 2015-12-17 DIAGNOSIS — E785 Hyperlipidemia, unspecified: Secondary | ICD-10-CM | POA: Diagnosis not present

## 2015-12-17 DIAGNOSIS — H401131 Primary open-angle glaucoma, bilateral, mild stage: Secondary | ICD-10-CM | POA: Diagnosis not present

## 2015-12-17 DIAGNOSIS — Z961 Presence of intraocular lens: Secondary | ICD-10-CM | POA: Diagnosis not present

## 2015-12-17 DIAGNOSIS — I2510 Atherosclerotic heart disease of native coronary artery without angina pectoris: Secondary | ICD-10-CM

## 2015-12-17 DIAGNOSIS — E119 Type 2 diabetes mellitus without complications: Secondary | ICD-10-CM | POA: Diagnosis not present

## 2015-12-17 DIAGNOSIS — Z794 Long term (current) use of insulin: Secondary | ICD-10-CM | POA: Diagnosis not present

## 2015-12-17 LAB — HM DIABETES EYE EXAM

## 2015-12-17 NOTE — Progress Notes (Signed)
Subjective:    Patient ID: Jerry Burns, male    DOB: 1933-12-21. Patient is being seen in consultation for management of diabetes requested by  Sallee Lange, MD  Past Medical History  Diagnosis Date  . Hyperlipidemia   . Stroke ([place])   . Arthritis     Bilateral knee  . Hypertension   . Coronary artery disease   . Anginal pain ([place])   . Diabetes mellitus     insulin dependent   . Progressive angina ([place]) 03/2015    3.0x12 mm Resolute DES to pLAD, Angiosculpt scoring balloon to dLAD   Past Surgical History  Procedure Laterality Date  . Tonsillectomy    . Appendectomy    . Shoulder surgery    . Colonoscopy  2011    RMR: 1. Normal rectum 2. Left sided diverticula , 2 ascending colon polyps, status post snare polypectomy. Remainder of colonic mucosa appeared unremarkable.   . Coronary angioplasty with stent placement  08/22/2014    PTCA/DES X mLAD with 2.5 x 16 mm Promus Premier DES by Dr Julianne Handler  . Foot surgery Right   . Left heart catheterization with coronary angiogram N/A 08/22/2014    Procedure: LEFT HEART CATHETERIZATION WITH CORONARY ANGIOGRAM;  Surgeon: Burnell Blanks, MD;  Location: [facility] CATH [facility];  Service: Cardiovascular;  Laterality: N/A;  . Percutaneous coronary stent intervention (pci-s)  08/22/2014    Procedure: PERCUTANEOUS CORONARY STENT INTERVENTION (PCI-S);  Surgeon: Burnell Blanks, MD;   . Cardiac catheterization N/A 04/12/2015    Procedure: Left Heart Cath and Coronary Angiography;  Surgeon: Sherren Mocha, MD; pLAD 75>0% w/ 3.0x12 mm Resolute DES, dLAD 80>0% w/ Angiosculpt scoring balloon, CFX luminal irreg, RCA mild dz, EF 55-65%    Social History   Social History  . Marital Status: Married    Spouse Name: N/A  . Number of Children: N/A  . Years of Education: 14   Occupational History  . retired    Social History Main Topics  . Smoking status: Former Smoker -- 1.00 packs/day for 20 years    Types: Cigarettes, Cigars    Start  date: 08/10/1955    Quit date: 07/28/2007  . Smokeless tobacco: Never Used  . Alcohol Use: 0.0 oz/week    0 Standard drinks or equivalent per week  . Drug Use: No  . Sexual Activity: Not Asked   Other Topics Concern  . None   Social History Narrative   Outpatient Encounter Prescriptions as of 12/17/2015  Medication Sig  . amLODipine (NORVASC) 10 MG tablet TAKE 1 BY MOUTH DAILY  . aspirin EC 81 MG tablet Take 81 mg by mouth daily.  Marland Kitchen atorvastatin (LIPITOR) 80 MG tablet Take 1 tablet (80 mg total) by mouth daily.  . carvedilol (COREG) 6.25 MG tablet TAKE (1) TABLET BY MOUTH TWICE DAILY.  Marland Kitchen clopidogrel (PLAVIX) 75 MG tablet TAKE 1 TABLET IN THE MORNING WITH BREAKFAST.  Marland Kitchen COMBIGAN 0.2-0.5 % ophthalmic solution   . dutasteride (AVODART) 0.5 MG capsule TAKE 1 BY MOUTH DAILY  . glucose blood (BAYER CONTOUR TEST) test strip USE TO TEST TWICE DAILY  . Insulin Glargine (LANTUS SOLOSTAR) 100 UNIT/ML Solostar Pen Inject 20 Units into the skin daily at 10 pm. (Patient taking differently: Inject 22 Units into the skin daily at 10 pm. )  . metFORMIN (GLUCOPHAGE) 1000 MG tablet TAKE 1 BY MOUTH TWICE DAILY WITH MEALS  . Multiple Vitamin (MULTIVITAMIN) tablet Take 1 tablet by mouth daily.  . nitroGLYCERIN (  NITROSTAT) 0.4 MG SL tablet PLACE 1 TAB UNDER TONGUE EVERY 5 MIN IF NEEDED FOR CHEST PAIN. MAY USE 3 TIMES.NO RELIEF CALL 911.  . psyllium (METAMUCIL) 58.6 % powder Take 1 packet by mouth.  . quinapril (ACCUPRIL) 40 MG tablet TAKE 1 BY MOUTH AT BEDTIME  . RELION MINI PEN NEEDLES 31G X 6 MM MISC USE AS DIRECTED  . tamsulosin (FLOMAX) 0.4 MG CAPS capsule TAKE 1 BY MOUTH DAILY   No facility-administered encounter medications on file as of 12/17/2015.   ALLERGIES: Allergies  Allergen Reactions  . Sulfa Antibiotics   . Latex Other (See Comments)    bandaids cause blisters after 24 hours    VACCINATION STATUS: Immunization History  Administered Date(s) Administered  . Influenza Whole 05/04/2007,  05/01/2008, 05/04/2008  . Influenza,inj,Quad PF,36+ Mos 05/07/2014, 06/06/2015  . Influenza-Unspecified 04/26/2012, 05/17/2013  . Pneumococcal Conjugate-13 01/30/2014  . Pneumococcal Polysaccharide-23 07/27/2006  . Td 05/01/2008, 05/04/2008  . Zoster 07/27/2008    Diabetes He presents for his follow-up diabetic visit. He has type 2 diabetes mellitus. Onset time: He was diagnosed at approximate age of 33 years. His disease course has been improving. There are no hypoglycemic associated symptoms. Pertinent negatives for hypoglycemia include no confusion, headaches, pallor or seizures. There are no diabetic associated symptoms. Pertinent negatives for diabetes include no chest pain, no fatigue, no polydipsia, no polyphagia, no polyuria and no weakness. Symptoms are improving. Diabetic complications include a CVA. Risk factors for coronary artery disease include diabetes mellitus, dyslipidemia, hypertension, male sex, obesity, sedentary lifestyle and tobacco exposure. Current diabetic treatment includes intensive insulin program and oral agent (monotherapy). He is compliant with treatment most of the time. His weight is decreasing steadily. He is following a generally unhealthy diet. When asked about meal planning, he reported none. He has had a previous visit with a dietitian. He participates in exercise intermittently. There is no change in his home blood glucose trend. His breakfast blood glucose range is generally 90-110 mg/dl. His lunch blood glucose range is generally 130-140 mg/dl. His dinner blood glucose range is generally 130-140 mg/dl. His overall blood glucose range is 130-140 mg/dl. An ACE inhibitor/angiotensin II receptor blocker is being taken. Eye exam is current.  Hyperlipidemia This is a chronic problem. The current episode started more than 1 year ago. The problem is controlled. Exacerbating diseases include diabetes and obesity. Pertinent negatives include no chest pain, myalgias or  shortness of breath. Current antihyperlipidemic treatment includes statins. Risk factors for coronary artery disease include diabetes mellitus, dyslipidemia, hypertension, male sex and obesity.  Hypertension This is a chronic problem. The current episode started more than 1 year ago. The problem is controlled. Pertinent negatives include no chest pain, headaches, neck pain, palpitations or shortness of breath. Risk factors for coronary artery disease include diabetes mellitus, dyslipidemia, obesity and smoking/tobacco exposure. Past treatments include ACE inhibitors. Hypertensive end-organ damage includes CAD/MI and CVA.    Review of Systems  Constitutional: Negative for fever, chills, fatigue and unexpected weight change.  HENT: Negative for dental problem, mouth sores and trouble swallowing.   Eyes: Negative for visual disturbance.  Respiratory: Negative for cough, choking, chest tightness, shortness of breath and wheezing.   Cardiovascular: Negative for chest pain, palpitations and leg swelling.  Gastrointestinal: Negative for nausea, vomiting, abdominal pain, diarrhea, constipation and abdominal distention.  Endocrine: Negative for polydipsia, polyphagia and polyuria.  Genitourinary: Negative for dysuria, urgency, hematuria and flank pain.  Musculoskeletal: Negative for myalgias, back pain, gait problem and neck pain.  Skin: Negative for pallor, rash and wound.  Neurological: Negative for seizures, syncope, weakness, numbness and headaches.  Psychiatric/Behavioral: Negative.  Negative for confusion and dysphoric mood.    Objective:    BP 136/74 mmHg  Pulse 63  Ht 5\' 11"  (1.803 m)  Wt 214 lb (97.07 kg)  BMI 29.86 kg/m2  SpO2 98%  Wt Readings from Last 3 Encounters:  12/17/15 214 lb (97.07 kg)  10/25/15 215 lb (97.523 kg)  09/25/15 213 lb (96.616 kg)    Physical Exam  Constitutional: He is oriented to person, place, and time. He appears well-developed and well-nourished. He is  cooperative. No distress.  HENT:  Head: Normocephalic and atraumatic.  Eyes: EOM are normal.  Neck: Normal range of motion. Neck supple. No tracheal deviation present. No thyromegaly present.  Cardiovascular: Normal rate, S1 normal, S2 normal and normal heart sounds.  Exam reveals no gallop.   No murmur heard. Pulses:      Dorsalis pedis pulses are 1+ on the right side, and 1+ on the left side.       Posterior tibial pulses are 1+ on the right side, and 1+ on the left side.  Pulmonary/Chest: Breath sounds normal. No respiratory distress. He has no wheezes.  Abdominal: Soft. Bowel sounds are normal. He exhibits no distension. There is no tenderness. There is no guarding and no CVA tenderness.  Musculoskeletal: He exhibits no edema.       Right shoulder: He exhibits no swelling and no deformity.  Neurological: He is alert and oriented to person, place, and time. He has normal strength and normal reflexes. No cranial nerve deficit or sensory deficit. Gait normal.  Skin: Skin is warm and dry. No rash noted. No cyanosis. Nails show no clubbing.  Psychiatric: He has a normal mood and affect. His speech is normal and behavior is normal. Judgment and thought content normal. Cognition and memory are normal.    CMP     Component Value Date/Time   NA 140 12/10/2015 0712   NA 137 04/13/2015 0424   K 4.9 12/10/2015 0712   CL 100 12/10/2015 0712   CO2 22 12/10/2015 0712   GLUCOSE 174* 12/10/2015 0712   GLUCOSE 216* 04/13/2015 0424   BUN 25 12/10/2015 0712   BUN 19 04/13/2015 0424   CREATININE 0.96 12/10/2015 0712   CREATININE 0.93 04/23/2014 0704   CALCIUM 9.5 12/10/2015 0712   PROT 6.5 09/12/2015 0713   PROT 6.4 04/23/2014 0704   ALBUMIN 4.4 09/12/2015 0713   ALBUMIN 4.0 04/23/2014 0704   AST 19 09/12/2015 0713   ALT 13 09/12/2015 0713   ALKPHOS 72 09/12/2015 0713   BILITOT 0.4 09/12/2015 0713   BILITOT 0.5 04/23/2014 0704   GFRNONAA 74 12/10/2015 0712   GFRAA 86 12/10/2015 0712      Diabetic Labs (most recent): Lab Results  Component Value Date   HGBA1C 7.3* 12/10/2015   HGBA1C 7.3* 09/10/2015   HGBA1C 7.2* 05/28/2015    Lipid Panel     Component Value Date/Time   CHOL 146 09/12/2015 0713   CHOL 155 07/31/2014 0703   TRIG 101 09/12/2015 0713   HDL 48 09/12/2015 0713   HDL 53 07/31/2014 0703   CHOLHDL 3.0 09/12/2015 0713   CHOLHDL 2.9 07/31/2014 0703   VLDL 19 07/31/2014 0703   LDLCALC 78 09/12/2015 0713   LDLCALC 83 07/31/2014 0703     Assessment & Plan:   1. DM type 2 causing vascular disease ([place]) - Patient has currently  uncontrolled asymptomatic type 2 DM since  82 years of age,  with most recent A1c of 7.3 %. Recent labs reviewed, showing normal renal function.   His diabetes is complicated by coronary artery disease, CVA and patient remains at a high risk for more acute and chronic complications of diabetes which include CAD, CVA, CKD, retinopathy, and neuropathy. These are all discussed in detail with the patient.  - I have counseled the patient on diet management and weight loss, by adopting a carbohydrate restricted/protein rich diet.  - Suggestion is made for patient to avoid simple carbohydrates   from their diet including Cakes , Desserts, Ice Cream,  Soda (  diet and regular) , Sweet Tea , Candies,  Chips, Cookies, Artificial Sweeteners,   and "Sugar-free" Products . This will help patient to have stable blood glucose profile and potentially avoid unintended weight gain.  - I encouraged the patient to switch to  unprocessed or minimally processed complex starch and increased protein intake (animal or plant source), fruits, and vegetables.  - Patient is advised to stick to a routine mealtimes to eat 3 meals  a day and avoid unnecessary snacks ( to snack only to correct hypoglycemia).  - The patient will be scheduled with Jearld Fenton, RDN, CDE for individualized DM education.  - I have approached patient with the following  individualized plan to manage diabetes and patient agrees:   - I  will proceed with basal insulin Lantus 22 units QHS,  -He will continue to monitor blood glucose before breakfast and at bedtime. -Patient is encouraged to call clinic for blood glucose levels less than 70 or above 200 mg /dl. - I will continue metformin 1000 mg by mouth twice a day, therapeutically suitable for patient.  - Patient will be considered for incretin therapy as appropriate next visit. - Patient specific target  A1c;  LDL, HDL, Triglycerides, and  Waist Circumference were discussed in detail.  2) BP/HTN: Controlled.  I advised him to continue his medications  3) Lipids/HPL:  Controlled, continue statins. 4)  Weight/Diet: CDE Consult will be initiated , exercise, and detailed carbohydrates information provided.  5) Chronic Care/Health Maintenance:  -Patient is on ACEI/ARB and Statin medications and encouraged to continue to follow up with Ophthalmology, Podiatrist at least yearly or according to recommendations, and advised to   stay away from smoking. I have recommended yearly flu vaccine and pneumonia vaccination at least every 5 years; moderate intensity exercise for up to 150 minutes weekly; and  sleep for at least 7 hours a day.  - 25 minutes of time was spent on the care of this patient , 50% of which was applied for counseling on diabetes complications and their preventions.  - Patient to bring meter and  blood glucose logs during their next visit.   - I advised patient to maintain close follow up with Sallee Lange, MD for primary care needs.  Follow up plan: - Return in about 3 months (around 03/18/2016) for diabetes, high blood pressure, high cholesterol, follow up with pre-visit labs, meter, and logs.  Glade Lloyd, MD Phone: [phone]  Fax: [phone]   12/17/2015, 10:48 AM

## 2015-12-17 NOTE — Patient Instructions (Signed)

## 2015-12-24 ENCOUNTER — Other Ambulatory Visit: Payer: Self-pay | Admitting: Family Medicine

## 2015-12-24 ENCOUNTER — Other Ambulatory Visit: Payer: Self-pay

## 2015-12-24 MED ORDER — DUTASTERIDE 0.5 MG PO CAPS: ORAL_CAPSULE | ORAL | Status: DC

## 2015-12-24 NOTE — Telephone Encounter (Signed)
Pt is needing 90 day refills on his dutasteride 0.5 mg        [facility] mail service

## 2015-12-24 NOTE — Telephone Encounter (Signed)
Spoke with patient and informed him per protocol that refills on requested medication was sent into pharmacy. Patient verbalized understanding.

## 2016-01-13 ENCOUNTER — Other Ambulatory Visit: Payer: Self-pay | Admitting: *Deleted

## 2016-01-13 MED ORDER — TAMSULOSIN HCL 0.4 MG PO CAPS: ORAL_CAPSULE | ORAL | Status: DC

## 2016-01-15 ENCOUNTER — Encounter: Payer: Self-pay | Admitting: Family Medicine

## 2016-01-15 ENCOUNTER — Ambulatory Visit (INDEPENDENT_AMBULATORY_CARE_PROVIDER_SITE_OTHER): Payer: Medicare Other | Admitting: Family Medicine

## 2016-01-15 ENCOUNTER — Telehealth: Payer: Self-pay | Admitting: Family Medicine

## 2016-01-15 VITALS — BP 130/64 | Temp 98.3°F | Ht 71.0 in | Wt 211.4 lb

## 2016-01-15 DIAGNOSIS — J209 Acute bronchitis, unspecified: Secondary | ICD-10-CM | POA: Diagnosis not present

## 2016-01-15 DIAGNOSIS — J4520 Mild intermittent asthma, uncomplicated: Secondary | ICD-10-CM | POA: Diagnosis not present

## 2016-01-15 DIAGNOSIS — I2510 Atherosclerotic heart disease of native coronary artery without angina pectoris: Secondary | ICD-10-CM

## 2016-01-15 DIAGNOSIS — Z79899 Other long term (current) drug therapy: Secondary | ICD-10-CM

## 2016-01-15 DIAGNOSIS — E785 Hyperlipidemia, unspecified: Secondary | ICD-10-CM

## 2016-01-15 MED ORDER — AMOXICILLIN-POT CLAVULANATE 875-125 MG PO TABS: 1.0000 | ORAL_TABLET | Freq: Two times a day (BID) | ORAL | Status: DC

## 2016-01-15 MED ORDER — ALBUTEROL SULFATE HFA 108 (90 BASE) MCG/ACT IN AERS: 2.0000 | INHALATION_SPRAY | Freq: Four times a day (QID) | RESPIRATORY_TRACT | Status: DC | PRN

## 2016-01-15 NOTE — Telephone Encounter (Signed)
Pt has appt 7/3 would like to know if you want any labs ran outside what Dr Dorris Fetch has ordered in the recent past  Please advise

## 2016-01-15 NOTE — Progress Notes (Signed)
   Subjective:    Patient ID: Jerry Burns, male    DOB: 05/23/1934, 82 y.o.   MRN: [id number]  Cough This is a new problem. The current episode started 1 to 4 weeks ago. The cough is non-productive. Treatments tried: Cough Drops    Cough since the first, cong and dranage  Persistent cough  Not feeling the best  Not wanting take cough meds due to diabetes  Irritating cough in the chest no sig headache or sore throt    No sig feve  couhgin a lot at the dentists office   Patient states no other concerns this visit.   Review of Systems  Respiratory: Positive for cough.    No vomiting no diarrhea no rash    Objective:   Physical Exam  Alert vital stable hydration good pleasant no acute distress HEENT mom his congestion trace normal neck supple lungs bilateral rare wheeze no tachypnea no crackles      Assessment & Plan:  Impression post viral rhinosinusitis/bronchitis with exacerbation of reactive airways plan antibiotics prescribed. Symptom care discussed albuterol 2 sprays 4 times a day when necessary

## 2016-01-16 NOTE — Telephone Encounter (Signed)
Notified patient that bloodwork has been ordered.  

## 2016-01-16 NOTE — Addendum Note (Signed)
Addended by: Jesusita Oka on: 01/16/2016 02:28 PM   Modules accepted: Orders

## 2016-01-16 NOTE — Telephone Encounter (Signed)
May do lipid liver profile no other testing indicated at this time

## 2016-01-21 DIAGNOSIS — E785 Hyperlipidemia, unspecified: Secondary | ICD-10-CM | POA: Diagnosis not present

## 2016-01-21 DIAGNOSIS — Z79899 Other long term (current) drug therapy: Secondary | ICD-10-CM | POA: Diagnosis not present

## 2016-01-22 LAB — LIPID PANEL
CHOL/HDL RATIO: 3.1 ratio (ref 0.0–5.0)
Cholesterol, Total: 135 mg/dL (ref 100–199)
HDL: 43 mg/dL (ref >39)
LDL CALC: 79 mg/dL (ref 0–99)
Triglycerides: 66 mg/dL (ref 0–149)
VLDL CHOLESTEROL CAL: 13 mg/dL (ref 5–40)

## 2016-01-22 LAB — HEPATIC FUNCTION PANEL
ALBUMIN: 4.2 g/dL (ref 3.5–4.7)
ALT: 14 IU/L (ref 0–44)
AST: 17 IU/L (ref 0–40)
Alkaline Phosphatase: 78 IU/L (ref 39–117)
BILIRUBIN TOTAL: 0.3 mg/dL (ref 0.0–1.2)
Bilirubin, Direct: 0.1 mg/dL (ref 0.00–0.40)
Total Protein: 6.8 g/dL (ref 6.0–8.5)

## 2016-01-27 ENCOUNTER — Encounter: Payer: Self-pay | Admitting: Family Medicine

## 2016-01-27 ENCOUNTER — Ambulatory Visit (INDEPENDENT_AMBULATORY_CARE_PROVIDER_SITE_OTHER): Payer: Medicare Other | Admitting: Family Medicine

## 2016-01-27 VITALS — BP 134/76 | Ht 71.0 in | Wt 211.0 lb

## 2016-01-27 DIAGNOSIS — I2510 Atherosclerotic heart disease of native coronary artery without angina pectoris: Secondary | ICD-10-CM

## 2016-01-27 DIAGNOSIS — E785 Hyperlipidemia, unspecified: Secondary | ICD-10-CM | POA: Diagnosis not present

## 2016-01-27 DIAGNOSIS — I10 Essential (primary) hypertension: Secondary | ICD-10-CM

## 2016-01-27 NOTE — Progress Notes (Signed)
   Subjective:    Patient ID: Jerry Burns, male    DOB: 03-Jun-1934, 82 y.o.   MRN: [id number]  Hypertension This is a chronic problem. The current episode started more than 1 year ago. Pertinent negatives include no chest pain.  Patient denies any chest tightness pressure pain shortness of breath. States takes his blood pressure medicine on a regular basis. Eye-Patient states that the specialist told him that he needed another skin graft to correct the issue with his eye is not sure if he is going to do this. For now he states he'll put up with it   Diabetes patient brings in his readings for review. Morning sugars in the 120-140 range. Recent A1c looks good. Blood sugars in the evening reasonable. Patient states that he would like to return diabetic care to [place] because of issues with guarding cost and efficiencies and he feels that his numbers are doing well and he is following a diabetic educators recommendation  Colon-patient had multiple questions regarding colonoscopy in 2011. Even though the patient's 82 years old he did have adenomas I would recommend that he get a repeat colonoscopy he states he will connect with gastroenterology  Hyperlipidemia he does take his cholesterol medicine. States overall he feels he does a pretty good job watching his diet. We talked how he could add flaxseed oil once daily to see if that can help get his LDL down.    Review of Systems  Constitutional: Negative for activity change, appetite change and fatigue.  HENT: Negative for congestion.   Respiratory: Negative for cough.   Cardiovascular: Negative for chest pain.  Gastrointestinal: Negative for abdominal pain.  Endocrine: Negative for polydipsia and polyphagia.  Neurological: Negative for weakness.  Psychiatric/Behavioral: Negative for confusion.       Objective:   Physical Exam  Constitutional: He appears well-nourished. No distress.  Cardiovascular: Normal rate, regular rhythm and normal  heart sounds.   No murmur heard. Pulmonary/Chest: Effort normal and breath sounds normal. No respiratory distress.  Musculoskeletal: He exhibits no edema.  Lymphadenopathy:    He has no cervical adenopathy.  Neurological: He is alert.  Psychiatric: His behavior is normal.  Vitals reviewed.    25 minutes was spent with the patient. Greater than half the time was spent in discussion and answering questions and counseling regarding the issues that the patient came in for today.      Assessment & Plan:  HTN- Patient was seen today as part of a visit regarding hypertension. The importance of healthy diet and regular physical activity was discussed. The importance of compliance with medications discussed. Ideal goal is to keep blood pressure low elevated levels certainly below [id number] when possible. The patient was counseled that keeping blood pressure under control lessen his risk of heart attack, stroke, kidney failure, and early death. The importance of regular follow-ups was discussed with the patient. Low-salt diet such as DASH recommended. Regular physical activity was recommended as well. Patient was advised to keep regular follow-ups. Blood pressure under good control continue current dosing Patient will be following up here regarding diabetes check A1c mid-September Patient will be connecting with GI to do his follow-up colonoscopy follow-p d to go ahead

## 2016-02-03 ENCOUNTER — Other Ambulatory Visit: Payer: Self-pay | Admitting: *Deleted

## 2016-02-03 ENCOUNTER — Other Ambulatory Visit: Payer: Self-pay | Admitting: Family Medicine

## 2016-02-03 MED ORDER — GLUCOSE BLOOD VI STRP: ORAL_STRIP | Status: DC

## 2016-02-06 ENCOUNTER — Ambulatory Visit (INDEPENDENT_AMBULATORY_CARE_PROVIDER_SITE_OTHER): Payer: Medicare Other | Admitting: Nurse Practitioner

## 2016-02-06 ENCOUNTER — Encounter: Payer: Self-pay | Admitting: Nurse Practitioner

## 2016-02-06 ENCOUNTER — Other Ambulatory Visit: Payer: Self-pay

## 2016-02-06 VITALS — BP 129/70 | HR 52 | Temp 96.7°F | Ht 71.5 in | Wt 208.4 lb

## 2016-02-06 DIAGNOSIS — Z86010 Personal history of colonic polyps: Secondary | ICD-10-CM

## 2016-02-06 DIAGNOSIS — Z7902 Long term (current) use of antithrombotics/antiplatelets: Secondary | ICD-10-CM

## 2016-02-06 DIAGNOSIS — I2510 Atherosclerotic heart disease of native coronary artery without angina pectoris: Secondary | ICD-10-CM

## 2016-02-06 MED ORDER — PEG-KCL-NACL-NASULF-NA ASC-C 100 G PO SOLR: 1.0000 | ORAL | Status: DC

## 2016-02-06 NOTE — Progress Notes (Signed)
cc'ed to pcp °

## 2016-02-06 NOTE — Assessment & Plan Note (Signed)
Patient remains on Plavix and 81 mg aspirin for drug-eluting stent placement in January 2016. Current recommendations include continuing Plavix for high risk history (stent placement) and low risk procedures (colonoscopy). The exception being for increased risk of post-polypectomy bleed for large polyps (generally, greater than 1 cm). Giving has not had a polyp of the size before, we'll proceed as noted below. A thorough discussion of the increased risk of bleeding was had with the patient and he continues to elect to proceed.

## 2016-02-06 NOTE — Assessment & Plan Note (Addendum)
Was due middle of last year for surveillance colonoscopy. However, he had a drug-eluting stent placed within the previous 3-4 months. Cardiology requested we hold off on the procedure until at least January 2017. He is generally asymptomatic from a GI standpoint. He states is still on Plavix, however his previous colonoscopy had to polyps in the ascending colon less than 6 mm and the colonoscopy before that with no polyps per patient report. At this point holding Plavix is higher risk than proceeding on Plavix. The patient was explained that should there be an unusually large polyp we may have to hold off on removing it until he is off Plavix or until it can be held.  Proceed with TCS with Dr. Gala Romney in near future: the risks, benefits, and alternatives have been discussed with the patient in detail. The patient states understanding and desires to proceed. This included discussion of the increased risk of post-polypectomy bleed while on Plavix, as per above.  The patient is on Plavix and aspirin, no antidepressants, chronic pain medications, anxiolytics. Conscious sedation should be adequate for his procedure.

## 2016-02-06 NOTE — Patient Instructions (Signed)
1. We will schedule your procedure for you. 2. Further recommendations to be based on the results of your procedure.

## 2016-02-06 NOTE — Progress Notes (Signed)
Referring Provider: Kathyrn Drown, MD Primary Care Physician:  Sallee Lange, MD Primary GI:  Dr. Gala Romney  Chief Complaint  Patient presents with  . Colonoscopy    HPI:   Jerry Burns is a 82 y.o. male who presents To schedule colonoscopy. His previous colonoscopy was on 11/20/2009 which noted sigmoid diverticulosis and to a; polyps which were removed and sent for pathological study. Surgical pathology found the polyps to be tubular adenoma. Recommended 5 year repeat colonoscopy. He came to our office to schedule colonoscopy on 11/21/2014. However, 3 months prior he had a drug-eluting stent placed to a 90%+ coronary artery blockage. Cardiology recommended holding off until January 2017. He presents today to reschedule.  Today he states he's doing ok, but he's having issues with his eyes. Denies abdominal pain, N/V, hematochezia, melena, change in bowel habits, unintentional weight loss. Denies chest pain, dyspnea, dizziness, lightheadedness, syncope, near syncope. Denies any other upper or lower GI symptoms.  Past Medical History  Diagnosis Date  . Hyperlipidemia   . Stroke ([place])   . Arthritis     Bilateral knee  . Hypertension   . Coronary artery disease   . Anginal pain ([place])   . Diabetes mellitus     insulin dependent   . Progressive angina ([place]) 03/2015    3.0x12 mm Resolute DES to pLAD, Angiosculpt scoring balloon to dLAD    Past Surgical History  Procedure Laterality Date  . Tonsillectomy    . Appendectomy    . Shoulder surgery    . Colonoscopy  2011    RMR: 1. Normal rectum 2. Left sided diverticula , 2 ascending colon polyps, status post snare polypectomy. Remainder of colonic mucosa appeared unremarkable.   . Coronary angioplasty with stent placement  08/22/2014    PTCA/DES X mLAD with 2.5 x 16 mm Promus Premier DES by Dr Julianne Handler  . Foot surgery Right   . Left heart catheterization with coronary angiogram N/A 08/22/2014    Procedure: LEFT HEART  CATHETERIZATION WITH CORONARY ANGIOGRAM;  Surgeon: Burnell Blanks, MD;  Location: [facility] CATH LAB;  Service: Cardiovascular;  Laterality: N/A;  . Percutaneous coronary stent intervention (pci-s)  08/22/2014    Procedure: PERCUTANEOUS CORONARY STENT INTERVENTION (PCI-S);  Surgeon: Burnell Blanks, MD;   . Cardiac catheterization N/A 04/12/2015    Procedure: Left Heart Cath and Coronary Angiography;  Surgeon: Sherren Mocha, MD; pLAD 75>0% w/ 3.0x12 mm Resolute DES, dLAD 80>0% w/ Angiosculpt scoring balloon, CFX luminal irreg, RCA mild dz, EF 55-65%     Current Outpatient Prescriptions  Medication Sig Dispense Refill  . albuterol (PROVENTIL HFA;VENTOLIN HFA) 108 (90 Base) MCG/ACT inhaler Inhale 2 puffs into the lungs every 6 (six) hours as needed for wheezing or shortness of breath. 1 Inhaler 2  . amLODipine (NORVASC) 10 MG tablet TAKE 1 BY MOUTH DAILY 30 tablet 3  . aspirin EC 81 MG tablet Take 81 mg by mouth daily.    Marland Kitchen atorvastatin (LIPITOR) 80 MG tablet Take 1 tablet (80 mg total) by mouth daily. 90 tablet 3  . carvedilol (COREG) 6.25 MG tablet TAKE (1) TABLET BY MOUTH TWICE DAILY. 180 tablet 3  . clopidogrel (PLAVIX) 75 MG tablet TAKE 1 TABLET IN THE MORNING WITH BREAKFAST. 30 tablet 3  . COMBIGAN 0.2-0.5 % ophthalmic solution   2  . dutasteride (AVODART) 0.5 MG capsule TAKE 1 BY MOUTH DAILY 90 capsule 0  . Insulin Glargine (LANTUS SOLOSTAR) 100 UNIT/ML Solostar Pen Inject 20 Units  into the skin daily at 10 pm. (Patient taking differently: Inject 22 Units into the skin daily at 10 pm. ) 30 mL 2  . metFORMIN (GLUCOPHAGE) 1000 MG tablet TAKE 1 BY MOUTH TWICE DAILY WITH MEALS 180 tablet 1  . Multiple Vitamin (MULTIVITAMIN) tablet Take 1 tablet by mouth daily.    . NON FORMULARY Flax seed   One daily    . psyllium (METAMUCIL) 58.6 % powder Take 1 packet by mouth.    . quinapril (ACCUPRIL) 40 MG tablet TAKE 1 BY MOUTH AT BEDTIME 90 tablet 1  . tamsulosin (FLOMAX) 0.4 MG CAPS capsule  TAKE 1 BY MOUTH DAILY 90 capsule 1   No current facility-administered medications for this visit.    Allergies as of 02/06/2016 - Review Complete 02/06/2016  Allergen Reaction Noted  . Sulfa antibiotics  06/04/2015  . Latex Other (See Comments) 06/29/2012    Family History  Problem Relation Age of Onset  . Diabetes    . Diabetes Brother   . Colon cancer Paternal Uncle     Social History   Social History  . Marital Status: Married    Spouse Name: N/A  . Number of Children: N/A  . Years of Education: 14   Occupational History  . retired    Social History Main Topics  . Smoking status: Former Smoker -- 1.00 packs/day for 20 years    Types: Cigarettes, Cigars    Start date: 08/10/1955    Quit date: 07/28/2007  . Smokeless tobacco: Never Used     Comment: Quit x 15 years  . Alcohol Use: 0.0 oz/week    0 Standard drinks or equivalent per week  . Drug Use: No  . Sexual Activity: Not Asked   Other Topics Concern  . None   Social History Narrative    Review of Systems: General: Negative for anorexia, weight loss, fever, chills, fatigue, weakness. CV: Negative for chest pain, angina, palpitations, dyspnea on exertion, peripheral edema.  Respiratory: Negative for dyspnea at rest, cough, sputum, wheezing.  GI: See history of present illness. Endo: Negative for unusual weight change.  Heme: Negative for bruising or bleeding.   Physical Exam: BP 129/70 mmHg  Pulse 52  Temp(Src) 96.7 F (35.9 C) (Oral)  Ht 5' 11.5" (1.816 m)  Wt 208 lb 6.4 oz (94.53 kg)  BMI 28.66 kg/m2 General:   Alert and oriented. Pleasant and cooperative. Well-nourished and well-developed.  Eyes:  Without icterus, sclera clear and conjunctiva pink.  Ears:  Normal auditory acuity. Cardiovascular:  S1, S2 present without murmurs appreciated. Extremities without clubbing or edema. Respiratory:  Clear to auscultation bilaterally. No wheezes, rales, or rhonchi. No distress.  Gastrointestinal:   +BS, soft, non-tender and non-distended. No HSM noted. No guarding or rebound. No masses appreciated.  Rectal:  Deferred  Musculoskalatal:  Symmetrical without gross deformities. Neurologic:  Alert and oriented x4;  grossly normal neurologically. Psych:  Alert and cooperative. Normal mood and affect. Heme/Lymph/Immune: No excessive bruising noted.    02/06/2016 8:51 AM   Disclaimer: This note was dictated with voice recognition software. Similar sounding words can inadvertently be transcribed and may not be corrected upon review.

## 2016-02-10 ENCOUNTER — Telehealth: Payer: Self-pay | Admitting: Family Medicine

## 2016-02-10 ENCOUNTER — Telehealth: Payer: Self-pay

## 2016-02-10 ENCOUNTER — Other Ambulatory Visit: Payer: Self-pay | Admitting: Family Medicine

## 2016-02-10 MED ORDER — GLUCOSE BLOOD VI STRP: ORAL_STRIP | Status: DC

## 2016-02-10 NOTE — Telephone Encounter (Signed)
Nurses please call patient. The patient is on insulin he should be able to check his readings 3 times daily as needed. Please call in his refills as requested 1 years worth is fine

## 2016-02-10 NOTE — Telephone Encounter (Signed)
BAYER CONTOUR NEXT TEST test strip TID (pt states insurance won't pay for TID if we could send in BID)  Pt is supposed to have this refilled, his pharmacy states it was  Sent to another pharmacy. I see this med in his past history but not current  meds, he states he still uses this strip and needs a refill now if possible.    [facility]

## 2016-02-10 NOTE — Telephone Encounter (Signed)
Pt called and wants to wait until Sept.to have his TCS.

## 2016-02-10 NOTE — Telephone Encounter (Signed)
Notified patient prescription was sent to pharmacy.

## 2016-02-10 NOTE — Telephone Encounter (Signed)
Noted  

## 2016-02-12 ENCOUNTER — Encounter (HOSPITAL_COMMUNITY): Payer: Self-pay

## 2016-02-12 ENCOUNTER — Other Ambulatory Visit: Payer: Self-pay

## 2016-02-12 ENCOUNTER — Ambulatory Visit (HOSPITAL_COMMUNITY): Admit: 2016-02-12 | Payer: Medicare Other | Admitting: Internal Medicine

## 2016-02-12 SURGERY — COLONOSCOPY
Anesthesia: Moderate Sedation

## 2016-02-12 MED ORDER — GLUCOSE BLOOD VI STRP: ORAL_STRIP | Status: DC

## 2016-02-24 ENCOUNTER — Telehealth: Payer: Self-pay | Admitting: Family Medicine

## 2016-02-24 ENCOUNTER — Other Ambulatory Visit: Payer: Self-pay | Admitting: Adult Health

## 2016-02-24 MED ORDER — INSULIN GLARGINE 100 UNIT/ML SOLOSTAR PEN: 22.0000 [IU] | PEN_INJECTOR | Freq: Every day | SUBCUTANEOUS | 0 refills | Status: DC

## 2016-02-24 NOTE — Telephone Encounter (Signed)
Pt is requesting 2 boxes of his Insulin Glargine (LANTUS SOLOSTAR) 100 UNIT/ML Solostar Pen Sent into [facility]

## 2016-02-24 NOTE — Telephone Encounter (Signed)
Prescription sent electronically to pharmacy. Patient notified. 

## 2016-03-17 ENCOUNTER — Telehealth: Payer: Self-pay | Admitting: Family Medicine

## 2016-03-17 DIAGNOSIS — I10 Essential (primary) hypertension: Secondary | ICD-10-CM

## 2016-03-17 DIAGNOSIS — E119 Type 2 diabetes mellitus without complications: Secondary | ICD-10-CM

## 2016-03-17 NOTE — Telephone Encounter (Signed)
Pt is requesting lab orders to be sent over for an upcoming appt on Sept. 1st. Last labs per epic were: lipid and hepatic on 6/27.

## 2016-03-17 NOTE — Telephone Encounter (Signed)
Metabolic 7, [id number]

## 2016-03-18 NOTE — Telephone Encounter (Signed)
Spoke with patient and informed him per [place] were ordered. Needs to be fasting prior to having labs drawn. Patient verbalized understanding.

## 2016-03-18 NOTE — Telephone Encounter (Signed)
Left message return call (labs ordered)

## 2016-03-19 DIAGNOSIS — E119 Type 2 diabetes mellitus without complications: Secondary | ICD-10-CM | POA: Diagnosis not present

## 2016-03-19 DIAGNOSIS — I10 Essential (primary) hypertension: Secondary | ICD-10-CM | POA: Diagnosis not present

## 2016-03-20 LAB — BASIC METABOLIC PANEL
BUN / CREAT RATIO: 23 (ref 10–24)
BUN: 22 mg/dL (ref 8–27)
CALCIUM: 9.4 mg/dL (ref 8.6–10.2)
CHLORIDE: 101 mmol/L (ref 96–106)
CO2: 26 mmol/L (ref 18–29)
Creatinine, Ser: 0.97 mg/dL (ref 0.76–1.27)
GFR calc Af Amer: 85 mL/min/{1.73_m2} (ref >59)
GFR calc non Af Amer: 73 mL/min/{1.73_m2} (ref >59)
GLUCOSE: 148 mg/dL — AB (ref 65–99)
Potassium: 5 mmol/L (ref 3.5–5.2)
Sodium: 140 mmol/L (ref 134–144)

## 2016-03-20 LAB — HEMOGLOBIN A1C
ESTIMATED AVERAGE GLUCOSE: 171 mg/dL
HEMOGLOBIN A1C: 7.6 % — AB (ref 4.8–5.6)

## 2016-03-24 DIAGNOSIS — H401131 Primary open-angle glaucoma, bilateral, mild stage: Secondary | ICD-10-CM | POA: Diagnosis not present

## 2016-04-07 ENCOUNTER — Ambulatory Visit: Payer: Medicare Other | Admitting: "Endocrinology

## 2016-04-13 ENCOUNTER — Ambulatory Visit (INDEPENDENT_AMBULATORY_CARE_PROVIDER_SITE_OTHER): Payer: Medicare Other | Admitting: Family Medicine

## 2016-04-13 ENCOUNTER — Encounter: Payer: Self-pay | Admitting: Family Medicine

## 2016-04-13 VITALS — BP 120/78 | Ht 71.0 in | Wt 216.5 lb

## 2016-04-13 DIAGNOSIS — I2510 Atherosclerotic heart disease of native coronary artery without angina pectoris: Secondary | ICD-10-CM | POA: Diagnosis not present

## 2016-04-13 DIAGNOSIS — E1159 Type 2 diabetes mellitus with other circulatory complications: Secondary | ICD-10-CM

## 2016-04-13 DIAGNOSIS — E785 Hyperlipidemia, unspecified: Secondary | ICD-10-CM

## 2016-04-13 DIAGNOSIS — Z23 Encounter for immunization: Secondary | ICD-10-CM | POA: Diagnosis not present

## 2016-04-13 DIAGNOSIS — I10 Essential (primary) hypertension: Secondary | ICD-10-CM | POA: Diagnosis not present

## 2016-04-13 MED ORDER — MOMETASONE FUROATE 0.1 % EX CREA: TOPICAL_CREAM | CUTANEOUS | 3 refills | Status: DC

## 2016-04-13 MED ORDER — SITAGLIPTIN PHOSPHATE 50 MG PO TABS: 50.0000 mg | ORAL_TABLET | Freq: Every day | ORAL | 5 refills | Status: DC

## 2016-04-13 NOTE — Patient Instructions (Signed)
Start Januvia 1 daily. Continue your other medications. It is possible that your morning sugars might start decreasing if they are below 100 and may need to back down on  nightly insulin.  Please do your blood work in January before your office visit.

## 2016-04-13 NOTE — Progress Notes (Signed)
   Subjective:    Patient ID: Jerry Burns, male    DOB: May 19, 1934, 82 y.o.   MRN: [id number]  Diabetes  He presents for his follow-up diabetic visit. He has type 2 diabetes mellitus. There are no hypoglycemic associated symptoms. Pertinent negatives for hypoglycemia include no confusion. There are no diabetic associated symptoms. Pertinent negatives for diabetes include no chest pain, no fatigue, no polydipsia, no polyphagia and no weakness. There are no hypoglycemic complications. There are no diabetic complications. There are no known risk factors for coronary artery disease. Current diabetic treatment includes insulin injections. He is compliant with treatment all of the time.   Results for orders placed or performed in visit on 43/32/95  Basic metabolic panel  Result Value Ref Range   Glucose 148 (H) 65 - 99 mg/dL   BUN 22 8 - 27 mg/dL   Creatinine, Ser 0.97 0.76 - 1.27 mg/dL   GFR calc non Af Amer 73 >59 mL/min/1.73   GFR calc Af Amer 85 >59 mL/min/1.73   BUN/Creatinine Ratio 23 10 - 24   Sodium 140 134 - 144 mmol/L   Potassium 5.0 3.5 - 5.2 mmol/L   Chloride 101 96 - 106 mmol/L   CO2 26 18 - 29 mmol/L   Calcium 9.4 8.6 - 10.2 mg/dL  Hemoglobin A1C  Result Value Ref Range   Hgb A1c MFr Bld 7.6 (H) 4.8 - 5.6 %   Est. average glucose Bld gHb Est-mCnc 171 mg/dL   Patient has no concerns at this time.  Hyperlipidemia previous lab work reviewed with patient patient watch his diet takes medication. Hypertension patient tries watch salt diet unable to do a lot of exercise states blood pressure been under good control Left-to-right [place] left eye persistent see specialist intermittently for this  Review of Systems  Constitutional: Negative for activity change, appetite change and fatigue.  HENT: Negative for congestion.   Respiratory: Negative for cough.   Cardiovascular: Negative for chest pain.  Gastrointestinal: Negative for abdominal pain.  Endocrine: Negative for polydipsia  and polyphagia.  Neurological: Negative for weakness.  Psychiatric/Behavioral: Negative for confusion.       Objective:   Physical Exam  Constitutional: He appears well-nourished. No distress.  Cardiovascular: Normal rate, regular rhythm and normal heart sounds.   No murmur heard. Pulmonary/Chest: Effort normal and breath sounds normal. No respiratory distress.  Musculoskeletal: He exhibits no edema.  Lymphadenopathy:    He has no cervical adenopathy.  Neurological: He is alert.  Psychiatric: His behavior is normal.  Vitals reviewed.         Assessment & Plan:  1. Essential hypertension  blood pressure good control continue current measures - Basic metabolic panel  2. DM type 2 causing vascular disease ([place])  diabetes A1c is gone up we will add Januvia. Patient to be careful regarding his long-acting insulin. Watch diet closely. Check feet on a regular basis. Follow-up here in proximally 4 months do lab work before next visit - Hemoglobin A1c - Microalbumin / creatinine urine ratio  3. Hyperlipidemia  previous labs reviewed with patient patient continue medication watch diet closely we will check a lipid profile before next visit in January. - Lipid panel  4. Need for vaccination  Flu vaccine given today. - Flu Vaccine QUAD 36+ mos PF IM (Fluarix & Fluzone Quad PF)

## 2016-04-23 ENCOUNTER — Ambulatory Visit (INDEPENDENT_AMBULATORY_CARE_PROVIDER_SITE_OTHER): Payer: Medicare Other | Admitting: Nurse Practitioner

## 2016-04-23 ENCOUNTER — Encounter: Payer: Self-pay | Admitting: Nurse Practitioner

## 2016-04-23 VITALS — BP 116/63 | HR 54 | Temp 97.8°F | Ht 71.0 in | Wt 214.0 lb

## 2016-04-23 DIAGNOSIS — I2510 Atherosclerotic heart disease of native coronary artery without angina pectoris: Secondary | ICD-10-CM | POA: Diagnosis not present

## 2016-04-23 DIAGNOSIS — Z86010 Personal history of colonic polyps: Secondary | ICD-10-CM

## 2016-04-23 NOTE — Assessment & Plan Note (Signed)
The patient has a history of colon polyps and was recommended for repeat colonoscopy earlier in this year. This was deferred initially due to recent cardiac stenting due to 90% blockage. The patient was rescheduled, but elected to push back to September when his Plavix would be discontinued. He is generally asymptomatic from a GI standpoint. We will proceed with repeat colonoscopy as previously recommended.  Proceed with TCS with Dr. Gala Romney in near future: the risks, benefits, and alternatives have been discussed with the patient in detail. The patient states understanding and desires to proceed.  The patient is not on any anticoagulants, anxiolytics, chronic pain medications, or antidepressants. He doesn't draw a single glass of wine in the evenings. Conscious sedation should be adequate for his procedure.

## 2016-04-23 NOTE — Patient Instructions (Signed)
1. We will schedule your procedure for you. 2. Return for follow-up based on postprocedure recommendations.

## 2016-04-23 NOTE — Progress Notes (Signed)
cc'ed to pcp °

## 2016-04-23 NOTE — Progress Notes (Signed)
Referring Provider: Kathyrn Drown, MD Primary Care Physician:  Sallee Lange, MD Primary GI:  Dr. Gala Romney  Chief Complaint  Patient presents with  . Colonoscopy    hx of polyps, last tcs 6-7 years ago    HPI:   Jerry Burns is a 82 y.o. male who presents on referral from primary care to schedule colonoscopy. Patient was last seen in our office 02/06/2016 also to schedule colonoscopy. His previous colonoscopy dated 11/20/2009 with sigmoid diverticulosis and two ascending colon polyps found to be tubular adenoma on surgical pathology, recommended 5 year repeat. He attempted to schedule an 2016 however he had just had a drug-eluting stent placed by interventional cardiologist and they recommended holding off until January 2017. He was asymptomatic at that time. The patient was referred for colonoscopy without need to stop Plavix or aspirin given high risk cardiac history. The patient called [place] and asked to cancel his colonoscopy because he wanted to wait until September 2017.  Today he states he's doing well. Wanted to wait until September when he was taken off Plavix. No dyspnea on exertion after stent, walks up inclines without issue. Denies abdominal pain, N/V, hematochezia, melena, acute changes in bowel habits, fever, chills. Energy is good. Denies chest pain, dyspnea, dizziness, lightheadedness, syncope, near syncope. Denies any other upper or lower GI symptoms.  Plavix has been d/c'd by cardiology.  Past Medical History:  Diagnosis Date  . Anginal pain ([place])   . Arthritis    Bilateral knee  . Coronary artery disease   . Diabetes mellitus    insulin dependent   . Hyperlipidemia   . Hypertension   . Progressive angina ([place]) 03/2015   3.0x12 mm Resolute DES to pLAD, Angiosculpt scoring balloon to dLAD  . Stroke [facility])     Past Surgical History:  Procedure Laterality Date  . APPENDECTOMY    . CARDIAC CATHETERIZATION N/A 04/12/2015   Procedure: Left Heart Cath and Coronary  Angiography;  Surgeon: Sherren Mocha, MD; pLAD 75>0% w/ 3.0x12 mm Resolute DES, dLAD 80>0% w/ Angiosculpt scoring balloon, CFX luminal irreg, RCA mild dz, EF 55-65%   . COLONOSCOPY  2011   RMR: 1. Normal rectum 2. Left sided diverticula , 2 ascending colon polyps, status post snare polypectomy. Remainder of colonic mucosa appeared unremarkable.   . CORONARY ANGIOPLASTY WITH STENT PLACEMENT  08/22/2014   PTCA/DES X mLAD with 2.5 x 16 mm Promus Premier DES by Dr Julianne Handler  . FOOT SURGERY Right   . LEFT HEART CATHETERIZATION WITH CORONARY ANGIOGRAM N/A 08/22/2014   Procedure: LEFT HEART CATHETERIZATION WITH CORONARY ANGIOGRAM;  Surgeon: Burnell Blanks, MD;  Location: [facility] CATH LAB;  Service: Cardiovascular;  Laterality: N/A;  . PERCUTANEOUS CORONARY STENT INTERVENTION (PCI-S)  08/22/2014   Procedure: PERCUTANEOUS CORONARY STENT INTERVENTION (PCI-S);  Surgeon: Burnell Blanks, MD;   . SHOULDER SURGERY    . TONSILLECTOMY      Current Outpatient Prescriptions  Medication Sig Dispense Refill  . albuterol (PROVENTIL HFA;VENTOLIN HFA) 108 (90 Base) MCG/ACT inhaler Inhale 2 puffs into the lungs every 6 (six) hours as needed for wheezing or shortness of breath. 1 Inhaler 2  . amLODipine (NORVASC) 10 MG tablet TAKE 1 BY MOUTH DAILY 30 tablet 3  . aspirin EC 81 MG tablet Take 81 mg by mouth daily.    Marland Kitchen atorvastatin (LIPITOR) 80 MG tablet Take 1 tablet (80 mg total) by mouth daily. 90 tablet 3  . carvedilol (COREG) 6.25 MG tablet TAKE (1)  TABLET BY MOUTH TWICE DAILY. 180 tablet 3  . COMBIGAN 0.2-0.5 % ophthalmic solution Place 1 drop into the left eye 2 (two) times daily.   2  . dutasteride (AVODART) 0.5 MG capsule TAKE 1 BY MOUTH DAILY 90 capsule 0  . glucose blood (BAYER CONTOUR NEXT TEST) test strip USE ONE STRIP TO CHECK GLUCOSE TWICE DAILY DX- E11.9 100 each 11  . Insulin Glargine (LANTUS SOLOSTAR) 100 UNIT/ML Solostar Pen Inject 22 Units into the skin daily at 10 pm. 10 pen 0  .  metFORMIN (GLUCOPHAGE) 1000 MG tablet TAKE 1 BY MOUTH TWICE DAILY WITH MEALS 180 tablet 1  . mometasone (ELOCON) 0.1 % cream Apply to affected area daily 45 g 3  . Multiple Vitamin (MULTIVITAMIN) tablet Take 1 tablet by mouth daily.    . nitroGLYCERIN (NITROSTAT) 0.4 MG SL tablet PLACE 1 TAB UNDER TONGUE EVERY 5 MIN IF NEEDED FOR CHEST PAIN. MAY USE 3 TIMES.NO RELIEF CALL 911. 25 tablet 3  . NON FORMULARY Flax seed   One daily    . psyllium (METAMUCIL) 58.6 % powder Take 1 packet by mouth daily.     . quinapril (ACCUPRIL) 40 MG tablet TAKE (1) TABLET BY MOUTH AT BEDTIME. 90 tablet 1  . tamsulosin (FLOMAX) 0.4 MG CAPS capsule TAKE 1 BY MOUTH DAILY 90 capsule 1  . peg 3350 powder (MOVIPREP) 100 g SOLR Take 1 kit (200 g total) by mouth as directed. (Patient not taking: Reported on 04/23/2016) 1 kit 0  . sitaGLIPtin (JANUVIA) 50 MG tablet Take 1 tablet (50 mg total) by mouth daily. (Patient not taking: Reported on 04/23/2016) 30 tablet 5   No current facility-administered medications for this visit.     Allergies as of 04/23/2016 - Review Complete 04/23/2016  Allergen Reaction Noted  . Sulfa antibiotics  06/04/2015  . Latex Other (See Comments) 06/29/2012    Family History  Problem Relation Age of Onset  . Diabetes    . Diabetes Brother   . Colon cancer Maternal Uncle     Social History   Social History  . Marital status: Married    Spouse name: N/A  . Number of children: N/A  . Years of education: 54   Occupational History  . retired    Social History Main Topics  . Smoking status: Former Smoker    Packs/day: 1.00    Years: 20.00    Types: Cigarettes, Cigars    Start date: 08/10/1955    Quit date: 07/28/2007  . Smokeless tobacco: Never Used     Comment: Quit x 15 years  . Alcohol use 0.0 oz/week     Comment: Daily about 1 glass of wine  . Drug use: No  . Sexual activity: Not Asked   Other Topics Concern  . None   Social History Narrative  . None    Review of  Systems: General: Negative for anorexia, weight loss, fever, chills, fatigue, weakness. ENT: Negative for hoarseness, difficulty swallowing. CV: Negative for chest pain, angina, palpitations, peripheral edema.  Respiratory: Negative for dyspnea at rest, cough, sputum, wheezing.  GI: See history of present illness. Endo: Negative for unusual weight change.  Heme: Negative for bruising or bleeding.   Physical Exam: BP 116/63   Pulse (!) 54   Temp 97.8 F (36.6 C) (Oral)   Ht '5\' 11"'  (1.803 m)   Wt 214 lb (97.1 kg)   BMI 29.85 kg/m  General:   Alert and oriented. Pleasant and cooperative. Well-nourished and  well-developed.  Eyes:  Without icterus, sclera clear and conjunctiva pink.  Ears:  Normal auditory acuity. Cardiovascular:  S1, S2 present without murmurs appreciated. Extremities without clubbing or edema. Respiratory:  Clear to auscultation bilaterally. No wheezes, rales, or rhonchi. No distress.  Gastrointestinal:  +BS, soft, non-tender and non-distended. No HSM noted. No guarding or rebound. No masses appreciated.  Rectal:  Deferred  Musculoskalatal:  Symmetrical without gross deformities. Neurologic:  Alert and oriented x4;  grossly normal neurologically. Psych:  Alert and cooperative. Normal mood and affect. Heme/Lymph/Immune: No excessive bruising noted.    04/23/2016 8:51 AM   Disclaimer: This note was dictated with voice recognition software. Similar sounding words can inadvertently be transcribed and may not be corrected upon review.

## 2016-04-27 ENCOUNTER — Telehealth: Payer: Self-pay

## 2016-04-27 ENCOUNTER — Telehealth: Payer: Self-pay | Admitting: Family Medicine

## 2016-04-27 ENCOUNTER — Other Ambulatory Visit: Payer: Self-pay

## 2016-04-27 DIAGNOSIS — Z86010 Personal history of colonic polyps: Secondary | ICD-10-CM

## 2016-04-27 NOTE — Telephone Encounter (Signed)
Pt called to schedule TCS. TCS scheduled for 05/13/16 at 7:30 am. Arrive at hospital at 6:30 am. Pt stated he already had rx for Moviprep. Instructions mailed.

## 2016-04-27 NOTE — Telephone Encounter (Signed)
Pt dropped off his glucose readings. In doctors folder.

## 2016-04-28 NOTE — Telephone Encounter (Signed)
Please let the patient know that I reviewed over his glucose readings. In I recommend that he reduce his evening insulin to 18 units. Ideally I would like his morning sugars to be between 101 25. Try to avoid sugars in the 70s 80s and low 90s if possible. Keep all regular follow-ups.

## 2016-04-29 NOTE — Telephone Encounter (Signed)
Discussed with pt. Pt verbalized understanding.  °

## 2016-05-05 ENCOUNTER — Ambulatory Visit (INDEPENDENT_AMBULATORY_CARE_PROVIDER_SITE_OTHER): Payer: Medicare Other | Admitting: Cardiovascular Disease

## 2016-05-05 ENCOUNTER — Encounter: Payer: Self-pay | Admitting: Cardiovascular Disease

## 2016-05-05 VITALS — BP 110/56 | HR 65 | Ht 71.0 in | Wt 212.0 lb

## 2016-05-05 DIAGNOSIS — I10 Essential (primary) hypertension: Secondary | ICD-10-CM | POA: Diagnosis not present

## 2016-05-05 DIAGNOSIS — E7800 Pure hypercholesterolemia, unspecified: Secondary | ICD-10-CM | POA: Diagnosis not present

## 2016-05-05 DIAGNOSIS — I2510 Atherosclerotic heart disease of native coronary artery without angina pectoris: Secondary | ICD-10-CM | POA: Diagnosis not present

## 2016-05-05 NOTE — Progress Notes (Signed)
SUBJECTIVE: The patient returns for follow up regarding distal small vessel CAD and hypertension. The patient denies any symptoms of chest pain, palpitations, shortness of breath, lightheadedness, dizziness, leg swelling, orthopnea, PND, and syncope.  He walks a mile 360 days out of the year which includes an incline and he has no limitations.  ECG performed in the office today demonstrates sinus rhythm with a left anterior fascicular block.   Review of Systems: As per "subjective", otherwise negative.  Allergies  Allergen Reactions  . Sulfa Antibiotics   . Latex Other (See Comments)    bandaids cause blisters after 24 hours     Current Outpatient Prescriptions  Medication Sig Dispense Refill  . albuterol (PROVENTIL HFA;VENTOLIN HFA) 108 (90 Base) MCG/ACT inhaler Inhale 2 puffs into the lungs every 6 (six) hours as needed for wheezing or shortness of breath. 1 Inhaler 2  . amLODipine (NORVASC) 10 MG tablet TAKE 1 BY MOUTH DAILY 30 tablet 3  . aspirin EC 81 MG tablet Take 81 mg by mouth daily.    Marland Kitchen atorvastatin (LIPITOR) 80 MG tablet Take 1 tablet (80 mg total) by mouth daily. 90 tablet 3  . carvedilol (COREG) 6.25 MG tablet TAKE (1) TABLET BY MOUTH TWICE DAILY. 180 tablet 3  . COMBIGAN 0.2-0.5 % ophthalmic solution Place 1 drop into the left eye 2 (two) times daily.   2  . dutasteride (AVODART) 0.5 MG capsule TAKE 1 BY MOUTH DAILY 90 capsule 0  . glucose blood (BAYER CONTOUR NEXT TEST) test strip USE ONE STRIP TO CHECK GLUCOSE TWICE DAILY DX- E11.9 100 each 11  . Insulin Glargine (LANTUS SOLOSTAR) 100 UNIT/ML Solostar Pen Inject 22 Units into the skin daily at 10 pm. 10 pen 0  . metFORMIN (GLUCOPHAGE) 1000 MG tablet TAKE 1 BY MOUTH TWICE DAILY WITH MEALS 180 tablet 1  . mometasone (ELOCON) 0.1 % cream Apply to affected area daily 45 g 3  . Multiple Vitamin (MULTIVITAMIN) tablet Take 1 tablet by mouth daily.    . nitroGLYCERIN (NITROSTAT) 0.4 MG SL tablet PLACE 1 TAB UNDER  TONGUE EVERY 5 MIN IF NEEDED FOR CHEST PAIN. MAY USE 3 TIMES.NO RELIEF CALL 911. 25 tablet 3  . NON FORMULARY Flax seed   One daily    . peg 3350 powder (MOVIPREP) 100 g SOLR Take 1 kit (200 g total) by mouth as directed. 1 kit 0  . psyllium (METAMUCIL) 58.6 % powder Take 1 packet by mouth daily.     . quinapril (ACCUPRIL) 40 MG tablet TAKE (1) TABLET BY MOUTH AT BEDTIME. 90 tablet 1  . sitaGLIPtin (JANUVIA) 50 MG tablet Take 1 tablet (50 mg total) by mouth daily. 30 tablet 5  . tamsulosin (FLOMAX) 0.4 MG CAPS capsule TAKE 1 BY MOUTH DAILY 90 capsule 1   No current facility-administered medications for this visit.     Past Medical History:  Diagnosis Date  . Anginal pain ([place])   . Arthritis    Bilateral knee  . Coronary artery disease   . Diabetes mellitus    insulin dependent   . Hyperlipidemia   . Hypertension   . Progressive angina ([place]) 03/2015   3.0x12 mm Resolute DES to pLAD, Angiosculpt scoring balloon to dLAD  . Stroke [facility])     Past Surgical History:  Procedure Laterality Date  . APPENDECTOMY    . CARDIAC CATHETERIZATION N/A 04/12/2015   Procedure: Left Heart Cath and Coronary Angiography;  Surgeon: Sherren Mocha, MD; pLAD 75>0%  w/ 3.0x12 mm Resolute DES, dLAD 80>0% w/ Angiosculpt scoring balloon, CFX luminal irreg, RCA mild dz, EF 55-65%   . COLONOSCOPY  2011   RMR: 1. Normal rectum 2. Left sided diverticula , 2 ascending colon polyps, status post snare polypectomy. Remainder of colonic mucosa appeared unremarkable.   . CORONARY ANGIOPLASTY WITH STENT PLACEMENT  08/22/2014   PTCA/DES X mLAD with 2.5 x 16 mm Promus Premier DES by Dr Julianne Handler  . FOOT SURGERY Right   . LEFT HEART CATHETERIZATION WITH CORONARY ANGIOGRAM N/A 08/22/2014   Procedure: LEFT HEART CATHETERIZATION WITH CORONARY ANGIOGRAM;  Surgeon: Burnell Blanks, MD;  Location: [facility] C[facility] LAB;  Service: Cardiovascular;  Laterality: N/A;  . PERCUTANEOUS CORONARY STENT INTERVENTION (PCI-S)  08/22/2014    Procedure: PERCUTANEOUS CORONARY STENT INTERVENTION (PCI-S);  Surgeon: Burnell Blanks, MD;   . SHOULDER SURGERY    . TONSILLECTOMY      Social History   Social History  . Marital status: Married    Spouse name: N/A  . Number of children: N/A  . Years of education: 33   Occupational History  . retired    Social History Main Topics  . Smoking status: Former Smoker    Packs/day: 1.00    Years: 20.00    Types: Cigarettes, Cigars    Start date: 08/10/1955    Quit date: 07/28/2007  . Smokeless tobacco: Never Used     Comment: Quit x 15 years  . Alcohol use 0.0 oz/week     Comment: Daily about 1 glass of wine  . Drug use: No  . Sexual activity: Not Currently   Other Topics Concern  . Not on file   Social History Narrative  . No narrative on file     Vitals:   05/05/16 0818  BP: (!) 110/56  Pulse: 65  SpO2: 95%  Weight: 212 lb (96.2 kg)  Height: '5\' 11"'  (1.803 m)    PHYSICAL EXAM General: NAD HEENT: Normal. Neck: No JVD, no thyromegaly. Lungs: Clear to auscultation bilaterally with normal respiratory effort. CV: Nondisplaced PMI.  Regular rate and rhythm, normal S1/S2, no S3/S4, no murmur. No pretibial or periankle edema.  No carotid bruit.   Abdomen: Soft, nontender, no distention.  Neurologic: Alert and oriented.  Psych: Normal affect. Skin: Normal. Musculoskeletal: No gross deformities.    ECG: Most recent ECG reviewed.      ASSESSMENT AND PLAN: 1. CAD: Stable ischemic heart disease. Continue ASA, Coreg, and Lipitor.  2. Essential HTN: Well controlled on current therapy. No changes.  3. Hyperlipidemia: 01/21/16 LDL 79. Well controlled. No changes, continue Lipitor.  Dispo: f/u 1 year.   Kate Sable, M.D., F.A.C.C.

## 2016-05-05 NOTE — Patient Instructions (Signed)
Your physician wants you to follow-up in: 1 year Dr Koneswaran You will receive a reminder letter in the mail two months in advance. If you don't receive a letter, please call our office to schedule the follow-up appointment.     Your physician recommends that you continue on your current medications as directed. Please refer to the Current Medication list given to you today.    If you need a refill on your cardiac medications before your next appointment, please call your pharmacy.      Thank you for choosing [facility] !         

## 2016-05-13 ENCOUNTER — Ambulatory Visit (HOSPITAL_COMMUNITY)
Admission: RE | Admit: 2016-05-13 | Discharge: 2016-05-13 | Disposition: A | Discharge status: Home / Self Care | Payer: Medicare Other | Source: Ambulatory Visit | Attending: Internal Medicine | Admitting: Internal Medicine

## 2016-05-13 ENCOUNTER — Encounter (HOSPITAL_COMMUNITY)
Admission: RE | Disposition: A | Discharge status: Home / Self Care | Payer: Self-pay | Source: Ambulatory Visit | Attending: Internal Medicine

## 2016-05-13 ENCOUNTER — Encounter (HOSPITAL_COMMUNITY): Payer: Self-pay | Admitting: *Deleted

## 2016-05-13 DIAGNOSIS — Z7982 Long term (current) use of aspirin: Secondary | ICD-10-CM | POA: Diagnosis not present

## 2016-05-13 DIAGNOSIS — Z86010 Personal history of colonic polyps: Secondary | ICD-10-CM | POA: Diagnosis not present

## 2016-05-13 DIAGNOSIS — Z955 Presence of coronary angioplasty implant and graft: Secondary | ICD-10-CM | POA: Insufficient documentation

## 2016-05-13 DIAGNOSIS — E119 Type 2 diabetes mellitus without complications: Secondary | ICD-10-CM | POA: Diagnosis not present

## 2016-05-13 DIAGNOSIS — Z1211 Encounter for screening for malignant neoplasm of colon: Secondary | ICD-10-CM | POA: Insufficient documentation

## 2016-05-13 DIAGNOSIS — D125 Benign neoplasm of sigmoid colon: Secondary | ICD-10-CM

## 2016-05-13 DIAGNOSIS — Z794 Long term (current) use of insulin: Secondary | ICD-10-CM | POA: Insufficient documentation

## 2016-05-13 DIAGNOSIS — Z79899 Other long term (current) drug therapy: Secondary | ICD-10-CM | POA: Insufficient documentation

## 2016-05-13 DIAGNOSIS — D124 Benign neoplasm of descending colon: Secondary | ICD-10-CM | POA: Diagnosis not present

## 2016-05-13 DIAGNOSIS — I25119 Atherosclerotic heart disease of native coronary artery with unspecified angina pectoris: Secondary | ICD-10-CM | POA: Insufficient documentation

## 2016-05-13 DIAGNOSIS — I10 Essential (primary) hypertension: Secondary | ICD-10-CM | POA: Diagnosis not present

## 2016-05-13 DIAGNOSIS — Z87891 Personal history of nicotine dependence: Secondary | ICD-10-CM | POA: Insufficient documentation

## 2016-05-13 DIAGNOSIS — Z8673 Personal history of transient ischemic attack (TIA), and cerebral infarction without residual deficits: Secondary | ICD-10-CM | POA: Insufficient documentation

## 2016-05-13 DIAGNOSIS — E785 Hyperlipidemia, unspecified: Secondary | ICD-10-CM | POA: Insufficient documentation

## 2016-05-13 DIAGNOSIS — K5730 Diverticulosis of large intestine without perforation or abscess without bleeding: Secondary | ICD-10-CM | POA: Insufficient documentation

## 2016-05-13 HISTORY — PX: COLONOSCOPY: SHX5424

## 2016-05-13 LAB — GLUCOSE, CAPILLARY: GLUCOSE-CAPILLARY: 142 mg/dL — AB (ref 65–99)

## 2016-05-13 SURGERY — COLONOSCOPY
Anesthesia: Moderate Sedation

## 2016-05-13 MED ORDER — MIDAZOLAM HCL 5 MG/5ML IJ SOLN
INTRAMUSCULAR | Status: AC
  Filled 2016-05-13: qty 10

## 2016-05-13 MED ORDER — MEPERIDINE HCL 100 MG/ML IJ SOLN
INTRAMUSCULAR | Status: DC | PRN
  Administered 2016-05-13: 25 mg via INTRAVENOUS

## 2016-05-13 MED ORDER — SODIUM CHLORIDE 0.9 % IV SOLN
INTRAVENOUS | Status: DC
  Administered 2016-05-13: 07:00:00 via INTRAVENOUS

## 2016-05-13 MED ORDER — ONDANSETRON HCL 4 MG/2ML IJ SOLN
INTRAMUSCULAR | Status: DC | PRN
  Administered 2016-05-13: 4 mg via INTRAVENOUS

## 2016-05-13 MED ORDER — MIDAZOLAM HCL 5 MG/5ML IJ SOLN
INTRAMUSCULAR | Status: DC | PRN
  Administered 2016-05-13: 2 mg via INTRAVENOUS
  Administered 2016-05-13 (×2): 1 mg via INTRAVENOUS

## 2016-05-13 MED ORDER — MEPERIDINE HCL 100 MG/ML IJ SOLN
INTRAMUSCULAR | Status: AC
  Filled 2016-05-13: qty 2

## 2016-05-13 MED ORDER — ONDANSETRON HCL 4 MG/2ML IJ SOLN
INTRAMUSCULAR | Status: AC
  Filled 2016-05-13: qty 2

## 2016-05-13 NOTE — Discharge Instructions (Addendum)
Colonoscopy Discharge Instructions  Read the instructions outlined below and refer to this sheet in the next few weeks. These discharge instructions provide you with general information on caring for yourself after you leave the hospital. Your doctor may also give you specific instructions. While your treatment has been planned according to the most current medical practices available, unavoidable complications occasionally occur. If you have any problems or questions after discharge, call Dr. Gala Romney at [phone]. ACTIVITY  You may resume your regular activity, but move at a slower pace for the next 24 hours.   Take frequent rest periods for the next 24 hours.   Walking will help get rid of the air and reduce the bloated feeling in your belly (abdomen).   No driving for 24 hours (because of the medicine (anesthesia) used during the test).    Do not sign any important legal documents or operate any machinery for 24 hours (because of the anesthesia used during the test).  NUTRITION  Drink plenty of fluids.   You may resume your normal diet as instructed by your doctor.   Begin with a light meal and progress to your normal diet. Heavy or fried foods are harder to digest and may make you feel sick to your stomach (nauseated).   Avoid alcoholic beverages for 24 hours or as instructed.  MEDICATIONS  You may resume your normal medications unless your doctor tells you otherwise.  WHAT YOU CAN EXPECT TODAY  Some feelings of bloating in the abdomen.   Passage of more gas than usual.   Spotting of blood in your stool or on the toilet paper.  IF YOU HAD POLYPS REMOVED DURING THE COLONOSCOPY:  No aspirin products for 7 days or as instructed.   No alcohol for 7 days or as instructed.   Eat a soft diet for the next 24 hours.  FINDING OUT THE RESULTS OF YOUR TEST Not all test results are available during your visit. If your test results are not back during the visit, make an appointment  with your caregiver to find out the results. Do not assume everything is normal if you have not heard from your caregiver or the medical facility. It is important for you to follow up on all of your test results.  SEEK IMMEDIATE MEDICAL ATTENTION IF:  You have more than a spotting of blood in your stool.   Your belly is swollen (abdominal distention).   You are nauseated or vomiting.   You have a temperature over 101.   You have abdominal pain or discomfort that is severe or gets worse throughout the day.    Diverticulosis and colon polyp information provided  Further recommendations to follow pending review of pathology report   Diverticulosis Diverticulosis is the condition that develops when small pouches (diverticula) form in the wall of your colon. Your colon, or large intestine, is where water is absorbed and stool is formed. The pouches form when the inside layer of your colon pushes through weak spots in the outer layers of your colon. CAUSES  No one knows exactly what causes diverticulosis. RISK FACTORS  Being older than 16. Your risk for this condition increases with age. Diverticulosis is rare in people younger than 40 years. By age 11, almost everyone has it.  Eating a low-fiber diet.  Being frequently constipated.  Being overweight.  Not getting enough exercise.  Smoking.  Taking over-the-counter pain medicines, like aspirin and ibuprofen. SYMPTOMS  Most people with diverticulosis do not have symptoms. DIAGNOSIS  Because diverticulosis often has no symptoms, health care providers often discover the condition during an exam for other colon problems. In many cases, a health care provider will diagnose diverticulosis while using a flexible scope to examine the colon (colonoscopy). TREATMENT  If you have never developed an infection related to diverticulosis, you may not need treatment. If you have had an infection before, treatment may include:  Eating more  fruits, vegetables, and grains.  Taking a fiber supplement.  Taking a live bacteria supplement (probiotic).  Taking medicine to relax your colon. HOME CARE INSTRUCTIONS   Drink at least 6-8 glasses of water each day to prevent constipation.  Try not to strain when you have a bowel movement.  Keep all follow-up appointments. If you have had an infection before:  Increase the fiber in your diet as directed by your health care provider or dietitian.  Take a dietary fiber supplement if your health care provider approves.  Only take medicines as directed by your health care provider. SEEK MEDICAL CARE IF:   You have abdominal pain.  You have bloating.  You have cramps.  You have not gone to the bathroom in 3 days. SEEK IMMEDIATE MEDICAL CARE IF:   Your pain gets worse.  Yourbloating becomes very bad.  You have a fever or chills, and your symptoms suddenly get worse.  You begin vomiting.  You have bowel movements that are bloody or black. MAKE SURE YOU:  Understand these instructions.  Will watch your condition.  Will get help right away if you are not doing well or get worse.   This information is not intended to replace advice given to you by your health care provider. Make sure you discuss any questions you have with your health care provider.   Document Released: 04/09/2004 Document Revised: 07/18/2013 Document Reviewed: 06/07/2013 Elsevier Interactive Patient Education 2016 Elsevier Inc. Colon Polyps Polyps are lumps of extra tissue growing inside the body. Polyps can grow in the large intestine (colon). Most colon polyps are noncancerous (benign). However, some colon polyps can become cancerous over time. Polyps that are larger than a pea may be harmful. To be safe, caregivers remove and test all polyps. CAUSES  Polyps form when mutations in the genes cause your cells to grow and divide even though no more tissue is needed. RISK FACTORS There are a number  of risk factors that can increase your chances of getting colon polyps. They include:  Being older than 50 years.  Family history of colon polyps or colon cancer.  Long-term colon diseases, such as colitis or Crohn disease.  Being overweight.  Smoking.  Being inactive.  Drinking too much alcohol. SYMPTOMS  Most small polyps do not cause symptoms. If symptoms are present, they may include:  Blood in the stool. The stool may look dark red or black.  Constipation or diarrhea that lasts longer than 1 week. DIAGNOSIS People often do not know they have polyps until their caregiver finds them during a regular checkup. Your caregiver can use 4 tests to check for polyps:  Digital rectal exam. The caregiver wears gloves and feels inside the rectum. This test would find polyps only in the rectum.  Barium enema. The caregiver puts a liquid called barium into your rectum before taking X-rays of your colon. Barium makes your colon look white. Polyps are dark, so they are easy to see in the X-ray pictures.  Sigmoidoscopy. A thin, flexible tube (sigmoidoscope) is placed into your rectum. The sigmoidoscope has a  light and tiny camera in it. The caregiver uses the sigmoidoscope to look at the last third of your colon.  Colonoscopy. This test is like sigmoidoscopy, but the caregiver looks at the entire colon. This is the most common method for finding and removing polyps. TREATMENT  Any polyps will be removed during a sigmoidoscopy or colonoscopy. The polyps are then tested for cancer. PREVENTION  To help lower your risk of getting more colon polyps:  Eat plenty of fruits and vegetables. Avoid eating fatty foods.  Do not smoke.  Avoid drinking alcohol.  Exercise every day.  Lose weight if recommended by your caregiver.  Eat plenty of calcium and folate. Foods that are rich in calcium include milk, cheese, and broccoli. Foods that are rich in folate include chickpeas, kidney beans, and  spinach. HOME CARE INSTRUCTIONS Keep all follow-up appointments as directed by your caregiver. You may need periodic exams to check for polyps. SEEK MEDICAL CARE IF: You notice bleeding during a bowel movement.   This information is not intended to replace advice given to you by your health care provider. Make sure you discuss any questions you have with your health care provider.   Document Released: 04/08/2004 Document Revised: 08/03/2014 Document Reviewed: 09/22/2011 Elsevier Interactive Patient Education [employer].

## 2016-05-13 NOTE — H&P (View-Only) (Signed)
Referring Provider: Kathyrn Drown, MD Primary Care Physician:  Sallee Lange, MD Primary GI:  Dr. Gala Romney  Chief Complaint  Patient presents with  . Colonoscopy    hx of polyps, last tcs 6-7 years ago    HPI:   Jerry Burns is a 82 y.o. male who presents on referral from primary care to schedule colonoscopy. Patient was last seen in our office 02/06/2016 also to schedule colonoscopy. His previous colonoscopy dated 11/20/2009 with sigmoid diverticulosis and two ascending colon polyps found to be tubular adenoma on surgical pathology, recommended 5 year repeat. He attempted to schedule an 2016 however he had just had a drug-eluting stent placed by interventional cardiologist and they recommended holding off until January 2017. He was asymptomatic at that time. The patient was referred for colonoscopy without need to stop Plavix or aspirin given high risk cardiac history. The patient called [place] and asked to cancel his colonoscopy because he wanted to wait until September 2017.  Today he states he's doing well. Wanted to wait until September when he was taken off Plavix. No dyspnea on exertion after stent, walks up inclines without issue. Denies abdominal pain, N/V, hematochezia, melena, acute changes in bowel habits, fever, chills. Energy is good. Denies chest pain, dyspnea, dizziness, lightheadedness, syncope, near syncope. Denies any other upper or lower GI symptoms.  Plavix has been d/c'd by cardiology.  Past Medical History:  Diagnosis Date  . Anginal pain ([place])   . Arthritis    Bilateral knee  . Coronary artery disease   . Diabetes mellitus    insulin dependent   . Hyperlipidemia   . Hypertension   . Progressive angina ([place]) 03/2015   3.0x12 mm Resolute DES to pLAD, Angiosculpt scoring balloon to dLAD  . Stroke [facility])     Past Surgical History:  Procedure Laterality Date  . APPENDECTOMY    . CARDIAC CATHETERIZATION N/A 04/12/2015   Procedure: Left Heart Cath and Coronary  Angiography;  Surgeon: Sherren Mocha, MD; pLAD 75>0% w/ 3.0x12 mm Resolute DES, dLAD 80>0% w/ Angiosculpt scoring balloon, CFX luminal irreg, RCA mild dz, EF 55-65%   . COLONOSCOPY  2011   RMR: 1. Normal rectum 2. Left sided diverticula , 2 ascending colon polyps, status post snare polypectomy. Remainder of colonic mucosa appeared unremarkable.   . CORONARY ANGIOPLASTY WITH STENT PLACEMENT  08/22/2014   PTCA/DES X mLAD with 2.5 x 16 mm Promus Premier DES by Dr Julianne Handler  . FOOT SURGERY Right   . LEFT HEART CATHETERIZATION WITH CORONARY ANGIOGRAM N/A 08/22/2014   Procedure: LEFT HEART CATHETERIZATION WITH CORONARY ANGIOGRAM;  Surgeon: Burnell Blanks, MD;  Location: [facility] CATH LAB;  Service: Cardiovascular;  Laterality: N/A;  . PERCUTANEOUS CORONARY STENT INTERVENTION (PCI-S)  08/22/2014   Procedure: PERCUTANEOUS CORONARY STENT INTERVENTION (PCI-S);  Surgeon: Burnell Blanks, MD;   . SHOULDER SURGERY    . TONSILLECTOMY      Current Outpatient Prescriptions  Medication Sig Dispense Refill  . albuterol (PROVENTIL HFA;VENTOLIN HFA) 108 (90 Base) MCG/ACT inhaler Inhale 2 puffs into the lungs every 6 (six) hours as needed for wheezing or shortness of breath. 1 Inhaler 2  . amLODipine (NORVASC) 10 MG tablet TAKE 1 BY MOUTH DAILY 30 tablet 3  . aspirin EC 81 MG tablet Take 81 mg by mouth daily.    Marland Kitchen atorvastatin (LIPITOR) 80 MG tablet Take 1 tablet (80 mg total) by mouth daily. 90 tablet 3  . carvedilol (COREG) 6.25 MG tablet TAKE (1)  TABLET BY MOUTH TWICE DAILY. 180 tablet 3  . COMBIGAN 0.2-0.5 % ophthalmic solution Place 1 drop into the left eye 2 (two) times daily.   2  . dutasteride (AVODART) 0.5 MG capsule TAKE 1 BY MOUTH DAILY 90 capsule 0  . glucose blood (BAYER CONTOUR NEXT TEST) test strip USE ONE STRIP TO CHECK GLUCOSE TWICE DAILY DX- E11.9 100 each 11  . Insulin Glargine (LANTUS SOLOSTAR) 100 UNIT/ML Solostar Pen Inject 22 Units into the skin daily at 10 pm. 10 pen 0  .  metFORMIN (GLUCOPHAGE) 1000 MG tablet TAKE 1 BY MOUTH TWICE DAILY WITH MEALS 180 tablet 1  . mometasone (ELOCON) 0.1 % cream Apply to affected area daily 45 g 3  . Multiple Vitamin (MULTIVITAMIN) tablet Take 1 tablet by mouth daily.    . nitroGLYCERIN (NITROSTAT) 0.4 MG SL tablet PLACE 1 TAB UNDER TONGUE EVERY 5 MIN IF NEEDED FOR CHEST PAIN. MAY USE 3 TIMES.NO RELIEF CALL 911. 25 tablet 3  . NON FORMULARY Flax seed   One daily    . psyllium (METAMUCIL) 58.6 % powder Take 1 packet by mouth daily.     . quinapril (ACCUPRIL) 40 MG tablet TAKE (1) TABLET BY MOUTH AT BEDTIME. 90 tablet 1  . tamsulosin (FLOMAX) 0.4 MG CAPS capsule TAKE 1 BY MOUTH DAILY 90 capsule 1  . peg 3350 powder (MOVIPREP) 100 g SOLR Take 1 kit (200 g total) by mouth as directed. (Patient not taking: Reported on 04/23/2016) 1 kit 0  . sitaGLIPtin (JANUVIA) 50 MG tablet Take 1 tablet (50 mg total) by mouth daily. (Patient not taking: Reported on 04/23/2016) 30 tablet 5   No current facility-administered medications for this visit.     Allergies as of 04/23/2016 - Review Complete 04/23/2016  Allergen Reaction Noted  . Sulfa antibiotics  06/04/2015  . Latex Other (See Comments) 06/29/2012    Family History  Problem Relation Age of Onset  . Diabetes    . Diabetes Brother   . Colon cancer Maternal Uncle     Social History   Social History  . Marital status: Married    Spouse name: N/A  . Number of children: N/A  . Years of education: 10   Occupational History  . retired    Social History Main Topics  . Smoking status: Former Smoker    Packs/day: 1.00    Years: 20.00    Types: Cigarettes, Cigars    Start date: 08/10/1955    Quit date: 07/28/2007  . Smokeless tobacco: Never Used     Comment: Quit x 15 years  . Alcohol use 0.0 oz/week     Comment: Daily about 1 glass of wine  . Drug use: No  . Sexual activity: Not Asked   Other Topics Concern  . None   Social History Narrative  . None    Review of  Systems: General: Negative for anorexia, weight loss, fever, chills, fatigue, weakness. ENT: Negative for hoarseness, difficulty swallowing. CV: Negative for chest pain, angina, palpitations, peripheral edema.  Respiratory: Negative for dyspnea at rest, cough, sputum, wheezing.  GI: See history of present illness. Endo: Negative for unusual weight change.  Heme: Negative for bruising or bleeding.   Physical Exam: BP 116/63   Pulse (!) 54   Temp 97.8 F (36.6 C) (Oral)   Ht '5\' 11"'  (1.803 m)   Wt 214 lb (97.1 kg)   BMI 29.85 kg/m  General:   Alert and oriented. Pleasant and cooperative. Well-nourished and  well-developed.  Eyes:  Without icterus, sclera clear and conjunctiva pink.  Ears:  Normal auditory acuity. Cardiovascular:  S1, S2 present without murmurs appreciated. Extremities without clubbing or edema. Respiratory:  Clear to auscultation bilaterally. No wheezes, rales, or rhonchi. No distress.  Gastrointestinal:  +BS, soft, non-tender and non-distended. No HSM noted. No guarding or rebound. No masses appreciated.  Rectal:  Deferred  Musculoskalatal:  Symmetrical without gross deformities. Neurologic:  Alert and oriented x4;  grossly normal neurologically. Psych:  Alert and cooperative. Normal mood and affect. Heme/Lymph/Immune: No excessive bruising noted.    04/23/2016 8:51 AM   Disclaimer: This note was dictated with voice recognition software. Similar sounding words can inadvertently be transcribed and may not be corrected upon review.

## 2016-05-13 NOTE — Op Note (Signed)
[facility] Patient Name: Jerry Burns Procedure Date: 05/13/2016 7:11 AM MRN: [id number] Date of Birth: May 26, 1934 Attending MD: Norvel Richards , MD CSN: [id number] Age: 82 Admit Type: Outpatient Procedure:                Colonoscopy with snare polypectomy Indications:              High risk colon cancer surveillance: Personal                            history of colonic polyps Providers:                Norvel Richards, MD, Lurline Del, RN, Charlyne Petrin RN, RN Referring MD:              Medicines:                Midazolam 3 mg IV, Meperidine 25 mg IV, Ondansetron                            4 mg IV Complications:            No immediate complications. Estimated Blood Loss:     Estimated blood loss was minimal. Procedure:                Pre-Anesthesia Assessment:                           - Prior to the procedure, a History and Physical                            was performed, and patient medications and                            allergies were reviewed. The patient's tolerance of                            previous anesthesia was also reviewed. The risks                            and benefits of the procedure and the sedation                            options and risks were discussed with the patient.                            All questions were answered, and informed consent                            was obtained. Prior Anticoagulants: The patient has                            taken no previous anticoagulant or antiplatelet  agents. ASA Grade Assessment: II - A patient with                            mild systemic disease. After reviewing the risks                            and benefits, the patient was deemed in                            satisfactory condition to undergo the procedure.                           After obtaining informed consent, the colonoscope                            was passed  under direct vision. Throughout the                            procedure, the patient's blood pressure, pulse, and                            oxygen saturations were monitored continuously. The                            EC-[id number] ([id number]) scope was introduced through                            the anus and advanced to the the cecum, identified                            by appendiceal orifice and ileocecal valve. The                            colonoscopy was performed without difficulty. The                            patient tolerated the procedure well. The quality                            of the bowel preparation was adequate. The                            ileocecal valve, appendiceal orifice, and rectum                            were photographed. Scope In: 7:39:22 AM Scope Out: 7:57:17 AM Scope Withdrawal Time: 0 hours 11 minutes 57 seconds  Total Procedure Duration: 0 hours 17 minutes 55 seconds  Findings:      The perianal and digital rectal examinations were normal.      Multiple small and large-mouthed diverticula were found in the sigmoid       colon and descending colon.      (1) 5 mm polyp was found in the sigmoid colon. The polyp was  semi-pedunculated. The polyp was removed with a cold snare. Resection       and retrieval were complete. Estimated blood loss was minimal.      The exam was otherwise without abnormality on direct and retroflexion       views. Impression:               - Diverticulosis in the sigmoid colon and in the                            descending colon.                           - One 5 mm polyp in the sigmoid colon, removed with                            a cold snare. Resected and retrieved.                           - The examination was otherwise normal on direct                            and retroflexion views. Moderate Sedation:      Moderate (conscious) sedation was administered by the endoscopy nurse       and supervised by the  endoscopist. The following parameters were       monitored: oxygen saturation, heart rate, blood pressure, respiratory       rate, EKG, adequacy of pulmonary ventilation, and response to care.       Total physician intraservice time was 22 minutes. Recommendation:           - Patient has a contact number available for                            emergencies. The signs and symptoms of potential                            delayed complications were discussed with the                            patient. Return to normal activities tomorrow.                            Written discharge instructions were provided to the                            patient.                           - Resume previous diet.                           - Continue present medications.                           - Repeat colonoscopy is recommended for  surveillance. The colonoscopy date will be                            determined after pathology results from today's                            exam become available for review.                           - Return to GI clinic after studies are complete. Procedure Code(s):        --- Professional ---                           [phone], Moderate sedation services provided by the                            same physician or other qualified health care                            professional performing the diagnostic or                            therapeutic service that the sedation supports,                            requiring the presence of an independent trained                            observer to assist in the monitoring of the                            patient's level of consciousness and physiological                            status; initial 15 minutes of intraservice time,                            patient age 50 years or older Diagnosis Code(s):        --- Professional ---                           D12.5, Benign neoplasm of sigmoid  colon                           K57.30, Diverticulosis of large intestine without                            perforation or abscess without bleeding CPT copyright 2016 American Medical Association. All rights reserved. The codes documented in this report are preliminary and upon coder review may  be revised to meet current compliance requirements. Jerry Burns. Jerry Doane, MD Norvel Richards, MD 05/13/2016 8:06:40 AM This report has been signed electronically. Number of Addenda: 0

## 2016-05-13 NOTE — Interval H&P Note (Signed)
History and Physical Interval Note:  05/13/2016 7:30 AM  [name]  has presented today for surgery, with the diagnosis of History polyps  The various methods of treatment have been discussed with the patient and family. After consideration of risks, benefits and other options for treatment, the patient has consented to  Procedure(s) with comments: COLONOSCOPY (N/A) - 7:30 AM as a surgical intervention .  The patient's history has been reviewed, patient examined, no change in status, stable for surgery.  I have reviewed the patient's chart and labs.  Questions were answered to the patient's satisfaction.     Naliyah Neth  No change. Patient off Plavix. Surveillance colonoscopy per plan.  The risks, benefits, limitations, alternatives and imponderables have been reviewed with the patient. Questions have been answered. All parties are agreeable.

## 2016-05-15 ENCOUNTER — Encounter (HOSPITAL_COMMUNITY): Payer: Self-pay | Admitting: Internal Medicine

## 2016-05-16 ENCOUNTER — Encounter: Payer: Self-pay | Admitting: Internal Medicine

## 2016-05-21 ENCOUNTER — Telehealth: Payer: Self-pay | Admitting: Family Medicine

## 2016-05-21 NOTE — Telephone Encounter (Signed)
ERROR

## 2016-05-23 ENCOUNTER — Other Ambulatory Visit: Payer: Self-pay | Admitting: Family Medicine

## 2016-05-25 ENCOUNTER — Other Ambulatory Visit: Payer: Self-pay | Admitting: *Deleted

## 2016-05-25 ENCOUNTER — Telehealth: Payer: Self-pay | Admitting: Family Medicine

## 2016-05-25 MED ORDER — INSULIN GLARGINE 100 UNIT/ML SOLOSTAR PEN: 22.0000 [IU] | PEN_INJECTOR | Freq: Every day | SUBCUTANEOUS | 5 refills | Status: DC

## 2016-05-25 NOTE — Telephone Encounter (Signed)
Med sent to pharm. Pt notified.  

## 2016-05-25 NOTE — Telephone Encounter (Signed)
Pt is in the donut hole and is needing a prescription for qty 15 of his Insulin Glargine (LANTUS SOLOSTAR) 100 UNIT/ML Solostar Pen  Which will be a total of five 3 ml pens/  [facility] [place]

## 2016-06-10 ENCOUNTER — Telehealth: Payer: Self-pay | Admitting: Family Medicine

## 2016-06-10 NOTE — Telephone Encounter (Signed)
Spoke with patient and informed him per Dr.Scott Luking- Dr.Scott reviewed your readings.  Overall they look good. You should keep your appointment in January. Dr.Scott would recommend that you continue your testing but twice daily is adequate. Patient verbalized understanding.

## 2016-06-10 NOTE — Telephone Encounter (Signed)
The patient dropped off his readings. Overall they look good. Please inform the patient of such he should keep his appointment in January. I would recommend that he continue his testing but twice daily is adequate.

## 2016-06-15 ENCOUNTER — Other Ambulatory Visit: Payer: Self-pay | Admitting: *Deleted

## 2016-06-15 ENCOUNTER — Other Ambulatory Visit: Payer: Self-pay | Admitting: Adult Health

## 2016-06-15 MED ORDER — METFORMIN HCL 1000 MG PO TABS: ORAL_TABLET | ORAL | 0 refills | Status: DC

## 2016-06-15 MED ORDER — DUTASTERIDE 0.5 MG PO CAPS: ORAL_CAPSULE | ORAL | 0 refills | Status: DC

## 2016-06-16 ENCOUNTER — Other Ambulatory Visit: Payer: Self-pay | Admitting: *Deleted

## 2016-06-16 MED ORDER — METFORMIN HCL 1000 MG PO TABS: ORAL_TABLET | ORAL | 0 refills | Status: DC

## 2016-06-16 MED ORDER — DUTASTERIDE 0.5 MG PO CAPS: ORAL_CAPSULE | ORAL | 0 refills | Status: DC

## 2016-06-22 DIAGNOSIS — H401131 Primary open-angle glaucoma, bilateral, mild stage: Secondary | ICD-10-CM | POA: Diagnosis not present

## 2016-06-22 DIAGNOSIS — H02135 Senile ectropion of left lower eyelid: Secondary | ICD-10-CM | POA: Diagnosis not present

## 2016-07-02 DIAGNOSIS — H02135 Senile ectropion of left lower eyelid: Secondary | ICD-10-CM | POA: Diagnosis not present

## 2016-07-28 ENCOUNTER — Other Ambulatory Visit: Payer: Self-pay | Admitting: Family Medicine

## 2016-08-04 ENCOUNTER — Encounter: Payer: Self-pay | Admitting: Family Medicine

## 2016-08-04 ENCOUNTER — Telehealth: Payer: Self-pay | Admitting: Family Medicine

## 2016-08-04 MED ORDER — TAMSULOSIN HCL 0.4 MG PO CAPS: ORAL_CAPSULE | ORAL | 1 refills | Status: DC

## 2016-08-04 NOTE — Telephone Encounter (Signed)
Pt is needing a prescription for tamsulosin (FLOMAX) 0.4 MG CAPS capsule  Sent over to [facility]. Pt is no longer going to use the mail order.   [place]

## 2016-08-04 NOTE — Telephone Encounter (Signed)
Rx sent per protocol 

## 2016-08-10 DIAGNOSIS — I10 Essential (primary) hypertension: Secondary | ICD-10-CM | POA: Diagnosis not present

## 2016-08-10 DIAGNOSIS — E785 Hyperlipidemia, unspecified: Secondary | ICD-10-CM | POA: Diagnosis not present

## 2016-08-10 DIAGNOSIS — E1159 Type 2 diabetes mellitus with other circulatory complications: Secondary | ICD-10-CM | POA: Diagnosis not present

## 2016-08-11 LAB — BASIC METABOLIC PANEL
BUN/Creatinine Ratio: 22 (ref 10–24)
BUN: 24 mg/dL (ref 8–27)
CO2: 26 mmol/L (ref 18–29)
CREATININE: 1.09 mg/dL (ref 0.76–1.27)
Calcium: 10 mg/dL (ref 8.6–10.2)
Chloride: 100 mmol/L (ref 96–106)
GFR calc Af Amer: 74 mL/min/{1.73_m2} (ref >59)
GFR calc non Af Amer: 64 mL/min/{1.73_m2} (ref >59)
Glucose: 142 mg/dL — ABNORMAL HIGH (ref 65–99)
Potassium: 5.3 mmol/L — ABNORMAL HIGH (ref 3.5–5.2)
Sodium: 139 mmol/L (ref 134–144)

## 2016-08-11 LAB — LIPID PANEL
CHOLESTEROL TOTAL: 144 mg/dL (ref 100–199)
Chol/HDL Ratio: 2.7 ratio units (ref 0.0–5.0)
HDL: 53 mg/dL (ref >39)
LDL Calculated: 71 mg/dL (ref 0–99)
TRIGLYCERIDES: 98 mg/dL (ref 0–149)
VLDL Cholesterol Cal: 20 mg/dL (ref 5–40)

## 2016-08-11 LAB — HEMOGLOBIN A1C
ESTIMATED AVERAGE GLUCOSE: 171 mg/dL
Hgb A1c MFr Bld: 7.6 % — ABNORMAL HIGH (ref 4.8–5.6)

## 2016-08-11 LAB — MICROALBUMIN / CREATININE URINE RATIO
Creatinine, Urine: 31.2 mg/dL
MICROALB/CREAT RATIO: 338.5 mg/g{creat} — AB (ref 0.0–30.0)
MICROALBUM., U, RANDOM: 105.6 ug/mL

## 2016-08-13 ENCOUNTER — Ambulatory Visit: Payer: Medicare Other | Admitting: Family Medicine

## 2016-08-27 ENCOUNTER — Ambulatory Visit (INDEPENDENT_AMBULATORY_CARE_PROVIDER_SITE_OTHER): Payer: Medicare Other | Admitting: Family Medicine

## 2016-08-27 VITALS — BP 120/72 | Wt 215.0 lb

## 2016-08-27 DIAGNOSIS — H02115 Cicatricial ectropion of left lower eyelid: Secondary | ICD-10-CM | POA: Diagnosis not present

## 2016-08-27 DIAGNOSIS — N401 Enlarged prostate with lower urinary tract symptoms: Secondary | ICD-10-CM

## 2016-08-27 DIAGNOSIS — I10 Essential (primary) hypertension: Secondary | ICD-10-CM

## 2016-08-27 DIAGNOSIS — R3912 Poor urinary stream: Secondary | ICD-10-CM

## 2016-08-27 DIAGNOSIS — E7849 Other hyperlipidemia: Secondary | ICD-10-CM

## 2016-08-27 DIAGNOSIS — E1159 Type 2 diabetes mellitus with other circulatory complications: Secondary | ICD-10-CM

## 2016-08-27 DIAGNOSIS — E1121 Type 2 diabetes mellitus with diabetic nephropathy: Secondary | ICD-10-CM | POA: Diagnosis not present

## 2016-08-27 DIAGNOSIS — E784 Other hyperlipidemia: Secondary | ICD-10-CM

## 2016-08-27 MED ORDER — CARVEDILOL 6.25 MG PO TABS: ORAL_TABLET | ORAL | 3 refills | Status: DC

## 2016-08-27 MED ORDER — QUINAPRIL HCL 40 MG PO TABS: ORAL_TABLET | ORAL | 3 refills | Status: DC

## 2016-08-27 MED ORDER — ATORVASTATIN CALCIUM 80 MG PO TABS: 80.0000 mg | ORAL_TABLET | Freq: Every day | ORAL | 3 refills | Status: DC

## 2016-08-27 MED ORDER — DUTASTERIDE 0.5 MG PO CAPS: ORAL_CAPSULE | ORAL | 3 refills | Status: DC

## 2016-08-27 NOTE — Progress Notes (Signed)
   Subjective:    Patient ID: Jerry Burns, male    DOB: 02/07/1934, 83 y.o.   MRN: [id number]  HPI  Patient presents to office today for follow-up HTN and diabetes visit. Patient states he is happy at this time and denies issues.  This patient is seen I specialists on a regular basis Denies any angina symptoms Takes his blood pressure medicine heart medicine diabetes medicine States his overall energy level is doing okay denies being depressed Glucose numbers been running somewhat high. In the evening they're in the 150s to the 200 mark earlier in the day there and no 100-120 Has not started Januvia yet has it at home  Review of Systems  Constitutional: Negative for activity change, appetite change and fatigue.  HENT: Negative for congestion.   Respiratory: Negative for cough.   Cardiovascular: Negative for chest pain.  Gastrointestinal: Negative for abdominal pain.  Endocrine: Negative for polydipsia and polyphagia.  Neurological: Negative for weakness.  Psychiatric/Behavioral: Negative for confusion.       Objective:   Physical Exam  Constitutional: He appears well-nourished. No distress.  Cardiovascular: Normal rate, regular rhythm and normal heart sounds.   No murmur heard. Pulmonary/Chest: Effort normal and breath sounds normal. No respiratory distress.  Musculoskeletal: He exhibits no edema.  Lymphadenopathy:    He has no cervical adenopathy.  Neurological: He is alert.  Psychiatric: His behavior is normal.  Vitals reviewed.   Diabetic foot exam completed      Assessment & Plan:  Diabetes subpar control need to get A1c around 7 or low slightly lower. Encourage healthy eating regular physical activity monitoring readings sending those to [place] also continuing long-acting insulin metformin plus also add Januvia  Proteinuria-continue ACE inhibitor to help the kidneys will monitor periodically  Blood pressure overall good control  Heart disease  stable  Hyperlipidemia previous labs reviewed 's reviewed continue medication watch diet  BPH stable continue current medications  Patient follow-up in approximate 4 months to check A1c

## 2016-09-22 DIAGNOSIS — H401131 Primary open-angle glaucoma, bilateral, mild stage: Secondary | ICD-10-CM | POA: Diagnosis not present

## 2016-11-10 ENCOUNTER — Other Ambulatory Visit: Payer: Self-pay | Admitting: Family Medicine

## 2016-11-26 DIAGNOSIS — H02139 Senile ectropion of unspecified eye, unspecified eyelid: Secondary | ICD-10-CM | POA: Diagnosis not present

## 2016-11-30 ENCOUNTER — Other Ambulatory Visit: Payer: Self-pay | Admitting: Family Medicine

## 2016-12-18 ENCOUNTER — Telehealth: Payer: Self-pay | Admitting: Family Medicine

## 2016-12-18 DIAGNOSIS — D649 Anemia, unspecified: Secondary | ICD-10-CM

## 2016-12-18 DIAGNOSIS — E785 Hyperlipidemia, unspecified: Secondary | ICD-10-CM

## 2016-12-18 DIAGNOSIS — I10 Essential (primary) hypertension: Secondary | ICD-10-CM

## 2016-12-18 DIAGNOSIS — Z79899 Other long term (current) drug therapy: Secondary | ICD-10-CM

## 2016-12-18 DIAGNOSIS — E119 Type 2 diabetes mellitus without complications: Secondary | ICD-10-CM

## 2016-12-18 NOTE — Telephone Encounter (Signed)
Pt is requesting lab orders to be sent over. Pt is wanting to go tomorrow to get them done. Last labs per epic were: microalbumin,a1c,bmp,and lipid on 08/10/16.

## 2016-12-18 NOTE — Telephone Encounter (Signed)
Spoke with patient and informed per [place] forwarded. Patient verbalized understanding.

## 2016-12-18 NOTE — Telephone Encounter (Signed)
Hemoglobin A1c, lipid, liver, metabolic 7, CBC-diabetes hypertension hyperlipidemia anemia

## 2016-12-22 DIAGNOSIS — H401131 Primary open-angle glaucoma, bilateral, mild stage: Secondary | ICD-10-CM | POA: Diagnosis not present

## 2016-12-22 DIAGNOSIS — I10 Essential (primary) hypertension: Secondary | ICD-10-CM | POA: Diagnosis not present

## 2016-12-22 DIAGNOSIS — D649 Anemia, unspecified: Secondary | ICD-10-CM | POA: Diagnosis not present

## 2016-12-22 DIAGNOSIS — Z961 Presence of intraocular lens: Secondary | ICD-10-CM | POA: Diagnosis not present

## 2016-12-22 DIAGNOSIS — E119 Type 2 diabetes mellitus without complications: Secondary | ICD-10-CM | POA: Diagnosis not present

## 2016-12-22 DIAGNOSIS — E785 Hyperlipidemia, unspecified: Secondary | ICD-10-CM | POA: Diagnosis not present

## 2016-12-22 DIAGNOSIS — H02105 Unspecified ectropion of left lower eyelid: Secondary | ICD-10-CM | POA: Diagnosis not present

## 2016-12-23 LAB — CBC WITH DIFFERENTIAL/PLATELET
BASOS: 1 %
Basophils Absolute: 0 10*3/uL (ref 0.0–0.2)
EOS (ABSOLUTE): 0.4 10*3/uL (ref 0.0–0.4)
EOS: 6 %
HEMATOCRIT: 34.8 % — AB (ref 37.5–51.0)
HEMOGLOBIN: 11.5 g/dL — AB (ref 13.0–17.7)
IMMATURE GRANS (ABS): 0 10*3/uL (ref 0.0–0.1)
IMMATURE GRANULOCYTES: 0 %
LYMPHS: 14 %
Lymphocytes Absolute: 0.9 10*3/uL (ref 0.7–3.1)
MCH: 30.4 pg (ref 26.6–33.0)
MCHC: 33 g/dL (ref 31.5–35.7)
MCV: 92 fL (ref 79–97)
Monocytes Absolute: 0.6 10*3/uL (ref 0.1–0.9)
Monocytes: 9 %
NEUTROS ABS: 4.4 10*3/uL (ref 1.4–7.0)
NEUTROS PCT: 70 %
Platelets: 230 10*3/uL (ref 150–379)
RBC: 3.78 x10E6/uL — ABNORMAL LOW (ref 4.14–5.80)
RDW: 13.2 % (ref 12.3–15.4)
WBC: 6.2 10*3/uL (ref 3.4–10.8)

## 2016-12-23 LAB — BASIC METABOLIC PANEL
BUN/Creatinine Ratio: 22 (ref 10–24)
BUN: 24 mg/dL (ref 8–27)
CALCIUM: 9.5 mg/dL (ref 8.6–10.2)
CO2: 25 mmol/L (ref 18–29)
CREATININE: 1.1 mg/dL (ref 0.76–1.27)
Chloride: 102 mmol/L (ref 96–106)
GFR, EST AFRICAN AMERICAN: 72 mL/min/{1.73_m2} (ref >59)
GFR, EST NON AFRICAN AMERICAN: 63 mL/min/{1.73_m2} (ref >59)
Glucose: 130 mg/dL — ABNORMAL HIGH (ref 65–99)
POTASSIUM: 4.8 mmol/L (ref 3.5–5.2)
Sodium: 141 mmol/L (ref 134–144)

## 2016-12-23 LAB — LIPID PANEL
CHOL/HDL RATIO: 2.6 ratio (ref 0.0–5.0)
Cholesterol, Total: 155 mg/dL (ref 100–199)
HDL: 60 mg/dL (ref >39)
LDL Calculated: 81 mg/dL (ref 0–99)
TRIGLYCERIDES: 72 mg/dL (ref 0–149)
VLDL Cholesterol Cal: 14 mg/dL (ref 5–40)

## 2016-12-23 LAB — HEPATIC FUNCTION PANEL
ALT: 18 IU/L (ref 0–44)
AST: 22 IU/L (ref 0–40)
Albumin: 4.5 g/dL (ref 3.5–4.7)
Alkaline Phosphatase: 64 IU/L (ref 39–117)
BILIRUBIN, DIRECT: 0.16 mg/dL (ref 0.00–0.40)
Bilirubin Total: 0.6 mg/dL (ref 0.0–1.2)
Total Protein: 6.8 g/dL (ref 6.0–8.5)

## 2016-12-23 LAB — HEMOGLOBIN A1C
Est. average glucose Bld gHb Est-mCnc: 151 mg/dL
HEMOGLOBIN A1C: 6.9 % — AB (ref 4.8–5.6)

## 2016-12-24 ENCOUNTER — Encounter: Payer: Self-pay | Admitting: Family Medicine

## 2016-12-24 ENCOUNTER — Ambulatory Visit (INDEPENDENT_AMBULATORY_CARE_PROVIDER_SITE_OTHER): Payer: Medicare Other | Admitting: Family Medicine

## 2016-12-24 VITALS — BP 114/54 | Ht 71.0 in | Wt 218.0 lb

## 2016-12-24 DIAGNOSIS — N401 Enlarged prostate with lower urinary tract symptoms: Secondary | ICD-10-CM

## 2016-12-24 DIAGNOSIS — R3912 Poor urinary stream: Secondary | ICD-10-CM | POA: Diagnosis not present

## 2016-12-24 DIAGNOSIS — E784 Other hyperlipidemia: Secondary | ICD-10-CM | POA: Diagnosis not present

## 2016-12-24 DIAGNOSIS — E7849 Other hyperlipidemia: Secondary | ICD-10-CM

## 2016-12-24 DIAGNOSIS — I10 Essential (primary) hypertension: Secondary | ICD-10-CM

## 2016-12-24 DIAGNOSIS — D509 Iron deficiency anemia, unspecified: Secondary | ICD-10-CM

## 2016-12-24 NOTE — Patient Instructions (Addendum)
Tick Bite Information Introduction Ticks are insects that attach themselves to the skin. There are many types of ticks. Common types include wood ticks and deer ticks. Sometimes, ticks carry diseases that can make a person very ill. The most common places for ticks to attach themselves are the scalp, neck, armpits, waist, and groin. HOW CAN YOU PREVENT TICK BITES? Take these steps to help prevent tick bites when you are outdoors:  Wear long sleeves and long pants.  Wear white clothes so you can see ticks more easily.  Tuck your pant legs into your socks.  If walking on a trail, stay in the middle of the trail to avoid brushing against bushes.  Avoid walking through areas with long grass.  Put bug spray on all skin that is showing and along boot tops, pant legs, and sleeve cuffs.  Check clothes, hair, and skin often and before going inside.  Brush off any ticks that are not attached.  Take a shower or bath as soon as possible after being outdoors.  HOW SHOULD YOU REMOVE A TICK? Ticks should be removed as soon as possible to help prevent diseases. 1. If latex gloves are available, put them on before trying to remove a tick. 2. Use tweezers to grasp the tick as close to the skin as possible. You may also use curved forceps or a tick removal tool. Grasp the tick as close to its head as possible. Avoid grasping the tick on its body. 3. Pull gently upward until the tick lets go. Do not twist the tick or jerk it suddenly. This may break off the tick's head or mouth parts. 4. Do not squeeze or crush the tick's body. This could force disease-carrying fluids from the tick into your body. 5. After the tick is removed, wash the bite area and your hands with soap and water or alcohol. 6. Apply a small amount of antiseptic cream or ointment to the bite site. 7. Wash any tools that were used.  Do not try to remove a tick by applying a hot match, petroleum jelly, or fingernail polish to the tick.  These methods do not work. They may also increase the chances of disease being spread from the tick bite. WHEN SHOULD YOU SEEK HELP? Contact your health care provider if you are unable to remove a tick or if a part of the tick breaks off in the skin. After a tick bite, you need to watch for signs and symptoms of diseases that can be spread by ticks. Contact your health care provider if you develop any of the following:  Fever.  Rash.  Redness and puffiness (swelling) in the area of the tick bite.  Tender, puffy lymph glands.  Watery poop (diarrhea).  Weight loss.  Cough.  Feeling more tired than normal (fatigue).  Muscle, joint, or bone pain.  Belly (abdominal) pain.  Headache.  Change in your level of consciousness.  Trouble walking or moving your legs.  Loss of feeling (numbness) in the legs.  Loss of movement (paralysis).  Shortness of breath.  Confusion.  Throwing up (vomiting) many times.  This information is not intended to replace advice given to you by your health care provider. Make sure you discuss any questions you have with your health care provider. Document Released: 10/07/2009 Document Revised: 12/19/2015 Document Reviewed: 12/21/2012 Elsevier Interactive Patient Education  2018 [employer]vier Inc.  

## 2016-12-24 NOTE — Progress Notes (Signed)
   Subjective:    Patient ID: Jerry Burns, male    DOB: [birth year], 83 y.o.   MRN: [id number]  Hypertension  This is a chronic problem. The current episode started more than 1 year ago. Pertinent negatives include no chest pain.   Patient also has had recent labs drawn.  Patient takes his cholesterol medicine regular basis he denies any dietary issues denies any low sugar spells. States blood sugars typically run 1:30 in the morning 140 later in the day. Uses his insulin uses Januvia denies any chest tightness pressure pain or shortness of breath He does know that he needs a follow-up with cardiology Denies any fever muscle aches headaches Denies rashes related to bites. Does use insecticide to try to prevent tick bites  Patient has concerns of frequent tick bites,   also has concerns of heart issues.  States due to see cardiology in September.    Review of Systems  Constitutional: Negative for activity change, appetite change and fatigue.  HENT: Negative for congestion.   Respiratory: Negative for cough.   Cardiovascular: Negative for chest pain.  Gastrointestinal: Negative for abdominal pain.  Endocrine: Negative for polydipsia and polyphagia.  Neurological: Negative for weakness.  Psychiatric/Behavioral: Negative for confusion.       Objective:   Physical Exam  Constitutional: He appears well-nourished. No distress.  Cardiovascular: Normal rate, regular rhythm and normal heart sounds.   No murmur heard. Pulmonary/Chest: Effort normal and breath sounds normal. No respiratory distress.  Musculoskeletal: He exhibits no edema.  Lymphadenopathy:    He has no cervical adenopathy.  Neurological: He is alert.  Psychiatric: His behavior is normal.  Vitals reviewed.  Diabetic foot exam completed minimal neuropathy some calluses       Assessment & Plan:  HTN good control continue current measures BPH doing okay with the medication but I do not want to go up on it at  this point Anemia we need to check his ferritin TIBC in the stool tests for blood He had a colonoscopy last year Diabetes good control no low sugar spells he is educated regarding this Hyperlipidemia previous labs reviewed Labs will be ordered in the fall Tick bites we did discuss what to watch for

## 2016-12-25 ENCOUNTER — Other Ambulatory Visit (INDEPENDENT_AMBULATORY_CARE_PROVIDER_SITE_OTHER): Payer: Medicare Other | Admitting: *Deleted

## 2016-12-25 DIAGNOSIS — D509 Iron deficiency anemia, unspecified: Secondary | ICD-10-CM | POA: Diagnosis not present

## 2016-12-25 LAB — IFOBT (OCCULT BLOOD): IMMUNOLOGICAL FECAL OCCULT BLOOD TEST: NEGATIVE

## 2016-12-25 LAB — IRON AND TIBC
Iron Saturation: 23 % (ref 15–55)
Iron: 65 ug/dL (ref 38–169)
Total Iron Binding Capacity: 288 ug/dL (ref 250–450)
UIBC: 223 ug/dL (ref 111–343)

## 2016-12-25 LAB — FERRITIN: Ferritin: 38 ng/mL (ref 30–400)

## 2017-01-13 ENCOUNTER — Other Ambulatory Visit: Payer: Self-pay | Admitting: Family Medicine

## 2017-01-13 ENCOUNTER — Encounter: Payer: Self-pay | Admitting: *Deleted

## 2017-01-14 ENCOUNTER — Encounter: Payer: Self-pay | Admitting: Adult Health

## 2017-01-14 ENCOUNTER — Ambulatory Visit (INDEPENDENT_AMBULATORY_CARE_PROVIDER_SITE_OTHER): Payer: Medicare Other | Admitting: Adult Health

## 2017-01-14 VITALS — BP 122/72 | HR 61 | Ht 72.0 in | Wt 214.0 lb

## 2017-01-14 DIAGNOSIS — I10 Essential (primary) hypertension: Secondary | ICD-10-CM | POA: Diagnosis not present

## 2017-01-14 DIAGNOSIS — E118 Type 2 diabetes mellitus with unspecified complications: Secondary | ICD-10-CM

## 2017-01-14 DIAGNOSIS — I2510 Atherosclerotic heart disease of native coronary artery without angina pectoris: Secondary | ICD-10-CM | POA: Diagnosis not present

## 2017-01-14 DIAGNOSIS — R079 Chest pain, unspecified: Secondary | ICD-10-CM | POA: Diagnosis not present

## 2017-01-14 DIAGNOSIS — E7800 Pure hypercholesterolemia, unspecified: Secondary | ICD-10-CM

## 2017-01-14 MED ORDER — NITROGLYCERIN 0.4 MG SL SUBL: SUBLINGUAL_TABLET | SUBLINGUAL | 3 refills | Status: DC

## 2017-01-14 NOTE — Patient Instructions (Signed)
Medication Instructions:  Your physician recommends that you continue on your current medications as directed. Please refer to the Current Medication list given to you today.   Labwork: NONE  Testing/Procedures: Your physician has requested that you have a lexiscan myoview. For further information please visit [email or web address]. Please follow instruction sheet, as given.    Follow-Up: Your physician recommends that you schedule a follow-up appointment in: TO BE DETERMINED    Any Other Special Instructions Will Be Listed Below (If Applicable).     If you need a refill on your cardiac medications before your next appointment, please call your pharmacy.

## 2017-01-14 NOTE — Progress Notes (Signed)
Cardiology Office Note   Date:  01/14/2017   ID:  Jerry Burns, DOB 01-04-1934, MRN [id number]  PCP:  Kathyrn Drown, MD  Cardiologist: Dickie La chief complaint on file.     History of Present Illness: Jerry Burns is a 83 y.o. male who presents for ongoing assessment and management of CAD. Was last seen on 05/05/2016, the patient was stable from a cardiac standpoint without complaints of chest pain and dyspnea or fatigue. Blood pressure was well-controlled. No changes in medication were done at that time.  Patient was seen by PCP Dr. Wolfgang Phoenix on 12/24/2016, at that time he was clinically stable, denied any chest pain, blood pressure was well-controlled.  He comes today after not being seen since October 2017. The patient has a farm and usually likes to walk. Over the last month when he is walking a pill he has had recurrent chest pressure and mild shortness of breath. These are very similar to the symptoms he experienced prior to inset restenosis of his LAD. The patient had drug-eluting stent to the mid LAD on 04/12/2015. Until her bifrontal month ago he was walking several miles a day. He states "I know my body" when I begin to experience the symptoms, I am always found to have blockages in my coronary arteries. The patient has had 2 catheterizations based on abnormal stress tests which were completed due to his similar symptoms.  He denies medical noncompliance. He is being followed by his primary care regularly. Labs have been completed by PCP.  Past Medical History:  Diagnosis Date  . Anginal pain ([place])   . Arthritis    Bilateral knee  . Coronary artery disease   . Diabetes mellitus    insulin dependent   . Hyperlipidemia   . Hypertension   . Progressive angina ([place]) 03/2015   3.0x12 mm Resolute DES to pLAD, Angiosculpt scoring balloon to dLAD  . Stroke [facility])     Past Surgical History:  Procedure Laterality Date  . APPENDECTOMY    . CARDIAC CATHETERIZATION N/A  04/12/2015   Procedure: Left Heart Cath and Coronary Angiography;  Surgeon: Sherren Mocha, MD; pLAD 75>0% w/ 3.0x12 mm Resolute DES, dLAD 80>0% w/ Angiosculpt scoring balloon, CFX luminal irreg, RCA mild dz, EF 55-65%   . COLONOSCOPY  2011   RMR: 1. Normal rectum 2. Left sided diverticula , 2 ascending colon polyps, status post snare polypectomy. Remainder of colonic mucosa appeared unremarkable.   . COLONOSCOPY N/A 05/13/2016   Procedure: COLONOSCOPY;  Surgeon: Daneil Dolin, MD;  [facility];  Service: Endoscopy;  Laterality: N/A;  7:30 AM  . CORONARY ANGIOPLASTY WITH STENT PLACEMENT  08/22/2014   PTCA/DES X mLAD with 2.5 x 16 mm Promus Premier DES by Dr Julianne Handler  . FOOT SURGERY Right   . LEFT HEART CATHETERIZATION WITH CORONARY ANGIOGRAM N/A 08/22/2014   Procedure: LEFT HEART CATHETERIZATION WITH CORONARY ANGIOGRAM;  Surgeon: Burnell Blanks, MD;  Location: [facility] CATH LAB;  Service: Cardiovascular;  Laterality: N/A;  . PERCUTANEOUS CORONARY STENT INTERVENTION (PCI-S)  08/22/2014   Procedure: PERCUTANEOUS CORONARY STENT INTERVENTION (PCI-S);  Surgeon: Burnell Blanks, MD;   . SHOULDER SURGERY    . TONSILLECTOMY       Current Outpatient Prescriptions  Medication Sig Dispense Refill  . albuterol (PROVENTIL HFA;VENTOLIN HFA) 108 (90 Base) MCG/ACT inhaler Inhale 2 puffs into the lungs every 6 (six) hours as needed for wheezing or shortness of breath. 1 Inhaler 2  . amLODipine (  NORVASC) 10 MG tablet TAKE 1 TABLET BY MOUTH ONCE A DAY. 30 tablet 11  . aspirin EC 81 MG tablet Take 81 mg by mouth daily.    Marland Kitchen atorvastatin (LIPITOR) 80 MG tablet Take 1 tablet (80 mg total) by mouth daily. 90 tablet 3  . carvedilol (COREG) 6.25 MG tablet TAKE (1) TABLET BY MOUTH TWICE DAILY. 180 tablet 3  . COMBIGAN 0.2-0.5 % ophthalmic solution Place 1 drop into the left eye 2 (two) times daily.   2  . dutasteride (AVODART) 0.5 MG capsule TAKE (1) CAPSULE BY MOUTH ONCE DAILY. 90 capsule 3  .  glucose blood (BAYER CONTOUR NEXT TEST) test strip USE ONE STRIP TO CHECK GLUCOSE TWICE DAILY DX- E11.9 100 each 11  . Insulin Glargine (LANTUS SOLOSTAR) 100 UNIT/ML Solostar Pen Inject 22 Units into the skin daily at 10 pm. 15 mL 5  . metFORMIN (GLUCOPHAGE) 1000 MG tablet TAKE (1) TABLET BY MOUTH TWICE A DAY WITH A MEAL. 180 tablet 0  . mometasone (ELOCON) 0.1 % cream Apply to affected area daily (Patient taking differently: Apply 1 application topically daily as needed. For rash/irritated skin.) 45 g 3  . Multiple Vitamin (MULTIVITAMIN) tablet Take 1 tablet by mouth daily.    . nitroGLYCERIN (NITROSTAT) 0.4 MG SL tablet PLACE 1 TAB UNDER TONGUE EVERY 5 MIN IF NEEDED FOR CHEST PAIN. MAY USE 3 TIMES.NO RELIEF CALL 911. 25 tablet 3  . NON FORMULARY Flax seed   One daily    . psyllium (METAMUCIL) 58.6 % powder Take 1 packet by mouth daily.     . quinapril (ACCUPRIL) 40 MG tablet TAKE (1) TABLET BY MOUTH AT BEDTIME. 90 tablet 3  . sitaGLIPtin (JANUVIA) 50 MG tablet Take 50 mg by mouth daily.    . tamsulosin (FLOMAX) 0.4 MG CAPS capsule TAKE (1) CAPSULE BY MOUTH ONCE DAILY. 90 capsule 0   No current facility-administered medications for this visit.     Allergies:   Sulfa antibiotics and Latex    Social History:  The patient  reports that he quit smoking about 9 years ago. His smoking use included Cigarettes and Cigars. He started smoking about 61 years ago. He has a 20.00 pack-year smoking history. He has never used smokeless tobacco. He reports that he drinks alcohol. He reports that he does not use drugs.   Family History:  The patient's family history includes Cancer in his mother; Colon cancer in his maternal uncle; Diabetes in his brother and father; Hypertension in his sister.    ROS: All other systems are reviewed and negative. Unless otherwise mentioned in H&P    PHYSICAL EXAM: VS:  BP 122/72   Pulse 61   Ht 6' (1.829 m)   Wt 214 lb (97.1 kg)   SpO2 96%   BMI 29.02 kg/m  , BMI  Body mass index is 29.02 kg/m. GEN: Well nourished, well developed, in no acute distress  HEENT: normal  Neck: no JVD, carotid bruits, or masses Cardiac: RRR; no murmurs, rubs, or gallops,no edema  Respiratory:  clear to auscultation bilaterally, normal work of breathing GI: soft, nontender, nondistended, + BS MS: no deformity or atrophy  Skin: warm and dry, no rash Neuro:  Strength and sensation are intact Psych: euthymic mood, full affect   EKG:  The ekg ordered today demonstrates Sinus rhythm with first-degree AV block, PR interval 0.228. Left anterior fascicular block. Heart rate of 60 bpm.   Recent Labs: 12/22/2016: ALT 18; BUN 24; Creatinine, Ser  1.10; Hemoglobin 11.5; Platelets 230; Potassium 4.8; Sodium 141    Lipid Panel    Component Value Date/Time   CHOL 155 12/22/2016 0748   TRIG 72 12/22/2016 0748   HDL 60 12/22/2016 0748   CHOLHDL 2.6 12/22/2016 0748   CHOLHDL 2.9 07/31/2014 0703   VLDL 19 07/31/2014 0703   LDLCALC 81 12/22/2016 0748      Wt Readings from Last 3 Encounters:  01/14/17 214 lb (97.1 kg)  12/24/16 218 lb (98.9 kg)  08/27/16 215 lb (97.5 kg)      Other studies Reviewed: Echocardiogram Aug 09, 2014 Left ventricle: The cavity size was normal. Wall thickness was increased in a pattern of mild LVH. Systolic function was normal. The estimated ejection fraction was in the range of 60% to 65%. Wall motion was normal; there were no regional wall motion abnormalities. Features are consistent with a pseudonormal left ventricular filling pattern, with concomitant abnormal relaxation and increased filling pressure (grade 2 diastolic dysfunction). - Aortic valve: Mildly calcified annulus. Trileaflet; mildly thickened leaflets. - Mitral valve: Mildly calcified annulus. Normal thickness leaflets . - Left atrium: The atrium was severely dilated. - Right atrium: The atrium was moderately dilated. - Atrial septum: There was increased  thickness of the septum, consistent with lipomatous hypertrophy. - Pulmonary arteries: PA peak pressure: 32 mm Hg (S). PASP is borderline elevated. - Systemic veins: IVC is dilated with normal respiratory variation, estimated RA pressure is 8 mmHg.  Cardiac Cath 04/12/2015 Conclusion    Single vessel CAD involving the proximal LAD and distal LAD with severe in-stent restenosis in that segment.   Minor nonobstructive disease of the RCA and LCx  Successful PCI of the LAD using a DES in the proximal vessel and scoring balloon angioplasty in the distal vessel  Normal LV systolic function  Recommend: continue DAPT for at least another 12 months     ASSESSMENT AND PLAN:  1. Chest pain: Symptoms similar to angina pain that he experiences with recurrent in-stent restenosis or progressive CAD. We'll going to order a [employer] for reevaluation for ischemia. Do not want to go straight to catheterization unless abnormality is found. This is worrisome for his angina symptoms experienced in the past, especially with known history of diabetes and hypertension. I have asked him to refill his nitroglycerin tablets, and have them on hand as necessary. If he has recurrent symptoms which worsen he is to come to the emergency room.  2. CAD: Known history of drug-eluting stent to the mid LAD in January 2016, with in-stent restenosis of the proximal LAD on 09//2016. The patient will continue on current medication regimen to include carvedilol 6.25 mg twice a day, aspirin 81 mg daily, and statin therapy.   3. Hypertension: Blood pressure is currently well-controlled. No changes in regimen.  4. Diabetes: Followed by primary care with labs and medication management.  5. Hypercholesterolemia: Patient should continue statin therapy. Labs are completed by primary care     Current medicines are reviewed at length with the patient today.    Labs/ tests ordered today include: Lexiscan stress  Myoview  Phill Myron. West Pugh, ANP, AACC   01/14/2017 6:19 PM    [facility] [address], [place], [place] [address] Phone: [phone]; Fax: [phone]

## 2017-01-20 ENCOUNTER — Encounter (HOSPITAL_COMMUNITY)
Admission: RE | Admit: 2017-01-20 | Discharge: 2017-01-20 | Disposition: A | Discharge status: Home / Self Care | Payer: Medicare Other | Source: Ambulatory Visit | Attending: Adult Health | Admitting: Adult Health

## 2017-01-20 ENCOUNTER — Encounter (HOSPITAL_BASED_OUTPATIENT_CLINIC_OR_DEPARTMENT_OTHER)
Admission: RE | Admit: 2017-01-20 | Discharge: 2017-01-20 | Disposition: A | Discharge status: Home / Self Care | Payer: Medicare Other | Source: Ambulatory Visit | Attending: Adult Health | Admitting: Adult Health

## 2017-01-20 ENCOUNTER — Encounter (HOSPITAL_COMMUNITY): Payer: Self-pay

## 2017-01-20 DIAGNOSIS — R079 Chest pain, unspecified: Secondary | ICD-10-CM | POA: Insufficient documentation

## 2017-01-20 LAB — NM MYOCAR MULTI W/SPECT W/WALL MOTION / EF
CHL CUP NUCLEAR SSS: 5
LHR: 0.32
LV sys vol: 64 mL
LVDIAVOL: 146 mL (ref 62–150)
NUC STRESS TID: 1.21
Peak HR: 77 {beats}/min
Rest HR: 56 {beats}/min
SDS: 3
SRS: 2

## 2017-01-20 MED ORDER — REGADENOSON 0.4 MG/5ML IV SOLN
INTRAVENOUS | Status: AC
  Administered 2017-01-20: 0.4 mg via INTRAVENOUS
  Filled 2017-01-20: qty 5

## 2017-01-20 MED ORDER — SODIUM CHLORIDE 0.9% FLUSH
INTRAVENOUS | Status: AC
  Filled 2017-01-20: qty 180

## 2017-01-20 MED ORDER — SODIUM CHLORIDE 0.9% FLUSH
INTRAVENOUS | Status: AC
  Administered 2017-01-20: 10 mL via INTRAVENOUS
  Filled 2017-01-20: qty 10

## 2017-01-20 MED ORDER — TECHNETIUM TC 99M TETROFOSMIN IV KIT
10.0000 | PACK | Freq: Once | INTRAVENOUS | Status: AC | PRN
  Administered 2017-01-20: 10 via INTRAVENOUS

## 2017-01-20 MED ORDER — TECHNETIUM TC 99M TETROFOSMIN IV KIT
30.0000 | PACK | Freq: Once | INTRAVENOUS | Status: AC | PRN
  Administered 2017-01-20: 30 via INTRAVENOUS

## 2017-01-21 ENCOUNTER — Telehealth: Payer: Self-pay

## 2017-01-21 ENCOUNTER — Telehealth: Payer: Self-pay | Admitting: *Deleted

## 2017-01-21 NOTE — Telephone Encounter (Signed)
-----   Message from Lendon Colonel, NP sent at 01/20/2017  2:34 PM EDT ----- Stress test shows prior heart attach but some areas around the scar that show some blood flow deficiency. Will need appointment to talk about cath. Can see cardiologist as he has not seen him only seeing me for last few visits.

## 2017-01-21 NOTE — Telephone Encounter (Signed)
Called patient with test results. No answer. Left message to call back.  

## 2017-01-21 NOTE — Telephone Encounter (Signed)
Results given to pt, apt with Dr Bronson Ing on Monday 02/01/17 EDEN at 3 pm,pt to arrive 10 minutes prior

## 2017-01-26 ENCOUNTER — Other Ambulatory Visit: Payer: Self-pay | Admitting: Family Medicine

## 2017-02-01 ENCOUNTER — Encounter: Payer: Self-pay | Admitting: *Deleted

## 2017-02-01 ENCOUNTER — Encounter: Payer: Self-pay | Admitting: Cardiovascular Disease

## 2017-02-01 ENCOUNTER — Telehealth: Payer: Self-pay | Admitting: Cardiovascular Disease

## 2017-02-01 ENCOUNTER — Other Ambulatory Visit: Payer: Self-pay | Admitting: Cardiovascular Disease

## 2017-02-01 ENCOUNTER — Ambulatory Visit (INDEPENDENT_AMBULATORY_CARE_PROVIDER_SITE_OTHER): Payer: Medicare Other | Admitting: Cardiovascular Disease

## 2017-02-01 VITALS — BP 152/73 | HR 60 | Ht 71.0 in | Wt 216.0 lb

## 2017-02-01 DIAGNOSIS — I209 Angina pectoris, unspecified: Secondary | ICD-10-CM

## 2017-02-01 DIAGNOSIS — R9439 Abnormal result of other cardiovascular function study: Secondary | ICD-10-CM

## 2017-02-01 DIAGNOSIS — I25118 Atherosclerotic heart disease of native coronary artery with other forms of angina pectoris: Secondary | ICD-10-CM | POA: Diagnosis not present

## 2017-02-01 DIAGNOSIS — E7800 Pure hypercholesterolemia, unspecified: Secondary | ICD-10-CM

## 2017-02-01 DIAGNOSIS — Z955 Presence of coronary angioplasty implant and graft: Secondary | ICD-10-CM | POA: Diagnosis not present

## 2017-02-01 DIAGNOSIS — I10 Essential (primary) hypertension: Secondary | ICD-10-CM

## 2017-02-01 DIAGNOSIS — Z01812 Encounter for preprocedural laboratory examination: Secondary | ICD-10-CM

## 2017-02-01 DIAGNOSIS — E785 Hyperlipidemia, unspecified: Secondary | ICD-10-CM | POA: Diagnosis not present

## 2017-02-01 NOTE — Progress Notes (Addendum)
SUBJECTIVE: The patient presents for follow-up of coronary artery disease. He has distal small vessel coronary disease and hypertension. He has a prior history of LAD stent placement most recently in September 2016 for in-stent restenosis.  He saw K. Lawrence DNP on 01/14/17 and complained of chest pressure and shortness of breath. She ordered a nuclear stress test.  This demonstrated prior inferior, inferoseptal, and inferoapical myocardial infarction with moderate peri-infarct ischemia. It was deemed an intermediate risk study.  He has been experiencing chest pressure and exertional dyspnea similar to prior anginal symptoms. He has a half-mile which he walks at home and has been getting progressively more short of breath on the incline. His systolic blood pressure is usually in the 120 range and today is 152/73.   Review of Systems: As per "subjective", otherwise negative.  Allergies  Allergen Reactions  . Sulfa Antibiotics Other (See Comments)    "was terrible" "eye went crazy"  . Latex Other (See Comments)    bandaids cause blisters after 24 hours     Current Outpatient Prescriptions  Medication Sig Dispense Refill  . albuterol (PROVENTIL HFA;VENTOLIN HFA) 108 (90 Base) MCG/ACT inhaler Inhale 2 puffs into the lungs every 6 (six) hours as needed for wheezing or shortness of breath. 1 Inhaler 2  . amLODipine (NORVASC) 10 MG tablet TAKE 1 TABLET BY MOUTH ONCE A DAY. 30 tablet 11  . aspirin EC 81 MG tablet Take 81 mg by mouth daily.    Marland Kitchen atorvastatin (LIPITOR) 80 MG tablet Take 1 tablet (80 mg total) by mouth daily. 90 tablet 3  . carvedilol (COREG) 6.25 MG tablet TAKE (1) TABLET BY MOUTH TWICE DAILY. 180 tablet 3  . COMBIGAN 0.2-0.5 % ophthalmic solution Place 1 drop into the left eye 2 (two) times daily.   2  . dutasteride (AVODART) 0.5 MG capsule TAKE (1) CAPSULE BY MOUTH ONCE DAILY. 90 capsule 3  . glucose blood (BAYER CONTOUR NEXT TEST) test strip USE ONE STRIP TO CHECK  GLUCOSE TWICE DAILY DX- E11.9 100 each 11  . Insulin Glargine (LANTUS SOLOSTAR) 100 UNIT/ML Solostar Pen Inject 22 Units into the skin daily at 10 pm. 15 mL 5  . JANUVIA 50 MG tablet TAKE ONE TABLET BY MOUTH ONCE DAILY. 30 tablet 0  . metFORMIN (GLUCOPHAGE) 1000 MG tablet TAKE (1) TABLET BY MOUTH TWICE A DAY WITH A MEAL. 180 tablet 0  . mometasone (ELOCON) 0.1 % cream Apply to affected area daily (Patient taking differently: Apply 1 application topically daily as needed. For rash/irritated skin.) 45 g 3  . Multiple Vitamin (MULTIVITAMIN) tablet Take 1 tablet by mouth daily.    . nitroGLYCERIN (NITROSTAT) 0.4 MG SL tablet PLACE 1 TAB UNDER TONGUE EVERY 5 MIN IF NEEDED FOR CHEST PAIN. MAY USE 3 TIMES.NO RELIEF CALL 911. 25 tablet 3  . NON FORMULARY Flax seed   One daily    . psyllium (METAMUCIL) 58.6 % powder Take 1 packet by mouth daily.     . quinapril (ACCUPRIL) 40 MG tablet TAKE (1) TABLET BY MOUTH AT BEDTIME. 90 tablet 3  . tamsulosin (FLOMAX) 0.4 MG CAPS capsule TAKE (1) CAPSULE BY MOUTH ONCE DAILY. 90 capsule 0   No current facility-administered medications for this visit.     Past Medical History:  Diagnosis Date  . Anginal pain ([place])   . Arthritis    Bilateral knee  . Coronary artery disease   . Diabetes mellitus    insulin dependent   .  Hyperlipidemia   . Hypertension   . Progressive angina ([place]) 03/2015   3.0x12 mm Resolute DES to pLAD, Angiosculpt scoring balloon to dLAD  . Stroke [facility])     Past Surgical History:  Procedure Laterality Date  . APPENDECTOMY    . CARDIAC CATHETERIZATION N/A 04/12/2015   Procedure: Left Heart Cath and Coronary Angiography;  Surgeon: Sherren Mocha, MD; pLAD 75>0% w/ 3.0x12 mm Resolute DES, dLAD 80>0% w/ Angiosculpt scoring balloon, CFX luminal irreg, RCA mild dz, EF 55-65%   . COLONOSCOPY  2011   RMR: 1. Normal rectum 2. Left sided diverticula , 2 ascending colon polyps, status post snare polypectomy. Remainder of colonic mucosa appeared  unremarkable.   . COLONOSCOPY N/A 05/13/2016   Procedure: COLONOSCOPY;  Surgeon: Daneil Dolin, MD;  [facility];  Service: Endoscopy;  Laterality: N/A;  7:30 AM  . CORONARY ANGIOPLASTY WITH STENT PLACEMENT  08/22/2014   PTCA/DES X mLAD with 2.5 x 16 mm Promus Premier DES by Dr Julianne Handler  . FOOT SURGERY Right   . LEFT HEART CATHETERIZATION WITH CORONARY ANGIOGRAM N/A 08/22/2014   Procedure: LEFT HEART CATHETERIZATION WITH CORONARY ANGIOGRAM;  Surgeon: Burnell Blanks, MD;  Location: [facility] CATH LAB;  Service: Cardiovascular;  Laterality: N/A;  . PERCUTANEOUS CORONARY STENT INTERVENTION (PCI-S)  08/22/2014   Procedure: PERCUTANEOUS CORONARY STENT INTERVENTION (PCI-S);  Surgeon: Burnell Blanks, MD;   . SHOULDER SURGERY    . TONSILLECTOMY      Social History   Social History  . Marital status: Married    Spouse name: N/A  . Number of children: N/A  . Years of education: 74   Occupational History  . retired    Social History Main Topics  . Smoking status: Former Smoker    Packs/day: 1.00    Years: 20.00    Types: Cigarettes, Cigars    Start date: 08/10/1955    Quit date: 07/28/2007  . Smokeless tobacco: Never Used     Comment: Quit x 15 years  . Alcohol use 0.0 oz/week     Comment: Daily about 1 glass of wine  . Drug use: No  . Sexual activity: Not Currently   Other Topics Concern  . Not on file   Social History Narrative  . No narrative on file     Vitals:   02/01/17 1444  BP: (!) 152/73  Pulse: 60  SpO2: 97%  Weight: 216 lb (98 kg)  Height: 5\' 11"  (1.803 m)    Wt Readings from Last 3 Encounters:  02/01/17 216 lb (98 kg)  01/14/17 214 lb (97.1 kg)  12/24/16 218 lb (98.9 kg)     PHYSICAL EXAM General: NAD HEENT: Normal. Neck: No JVD, no thyromegaly. Lungs: Clear to auscultation bilaterally with normal respiratory effort. CV: Nondisplaced PMI.  Regular rate and rhythm, normal S1/S2, no S3/S4, no murmur. No pretibial or periankle edema.   No carotid bruit.   Abdomen: Soft, nontender, no distention.  Neurologic: Alert and oriented.  Psych: Normal affect. Skin: Normal. Musculoskeletal: No gross deformities.    ECG: Most recent ECG reviewed.   Labs: Lab Results  Component Value Date/Time   K 4.8 12/22/2016 07:48 AM   BUN 24 12/22/2016 07:48 AM   CREATININE 1.10 12/22/2016 07:48 AM   CREATININE 0.93 04/23/2014 07:04 AM   ALT 18 12/22/2016 07:48 AM   TSH 2.870 09/10/2015 08:17 AM   HGB 11.5 (L) 12/22/2016 07:48 AM     Lipids: Lab Results  Component Value Date/Time  [place] 81 12/22/2016 07:48 AM   CHOL 155 12/22/2016 07:48 AM   TRIG 72 12/22/2016 07:48 AM   HDL 60 12/22/2016 07:48 AM       ASSESSMENT AND PLAN: 1. CAD with history of prior LAD stent and current angina with abnormal stress test: Moderate peri-infarct ischemia as noted above. Currently taking aspirin, Lipitor, and carvedilol. Heart rate is adequately controlled, 60 bpm. Symptoms are similar to prior anginal symptoms. I will arrange for coronary angiography. Risks and benefits of cardiac catheterization have been discussed with the patient.  These include bleeding, infection, kidney damage, stroke, heart attack, death.  The patient understands these risks and is willing to proceed.  2. Essential HTN: Elevated. Systolic readings are normally in the 120 range. I will monitor.  3. Hyperlipidemia: Continue Lipitor.    Disposition: Follow up 1 month.  Time spent: 40 minutes, of which greater than 50% was spent reviewing symptoms, relevant blood tests and studies, and discussing management plan with the patient.    Kate Sable, M.D., F.A.C.C.

## 2017-02-01 NOTE — Addendum Note (Signed)
Addended by: Laurine Blazer on: 02/01/2017 03:26 PM   Modules accepted: Orders

## 2017-02-01 NOTE — Telephone Encounter (Signed)
Cath schedule

## 2017-02-01 NOTE — Patient Instructions (Signed)
Medication Instructions:  Continue all current medications.  Labwork:   Testing/Procedures: Your physician has requested that you have a cardiac catheterization. Cardiac catheterization is used to diagnose and/or treat various heart conditions. Doctors may recommend this procedure for a number of different reasons. The most common reason is to evaluate chest pain. Chest pain can be a symptom of coronary artery disease (CAD), and cardiac catheterization can show whether plaque is narrowing or blocking your heart's arteries. This procedure is also used to evaluate the valves, as well as measure the blood flow and oxygen levels in different parts of your heart. For further information please visit [email or web address]. Please follow instruction sheet, as given.  Follow-Up: 1 month  Any Other Special Instructions Will Be Listed Below (If Applicable).  If you need a refill on your cardiac medications before your next appointment, please call your pharmacy.

## 2017-02-02 ENCOUNTER — Ambulatory Visit (HOSPITAL_COMMUNITY)
Admission: RE | Admit: 2017-02-02 | Discharge: 2017-02-02 | Disposition: A | Discharge status: Home / Self Care | Payer: Medicare Other | Source: Ambulatory Visit | Attending: Cardiovascular Disease | Admitting: Cardiovascular Disease

## 2017-02-02 ENCOUNTER — Other Ambulatory Visit (HOSPITAL_COMMUNITY)
Admission: RE | Admit: 2017-02-02 | Discharge: 2017-02-02 | Disposition: A | Discharge status: Home / Self Care | Payer: Medicare Other | Source: Ambulatory Visit | Attending: Cardiovascular Disease | Admitting: Cardiovascular Disease

## 2017-02-02 ENCOUNTER — Telehealth: Payer: Self-pay

## 2017-02-02 DIAGNOSIS — Z01810 Encounter for preprocedural cardiovascular examination: Secondary | ICD-10-CM | POA: Diagnosis not present

## 2017-02-02 DIAGNOSIS — Z01812 Encounter for preprocedural laboratory examination: Secondary | ICD-10-CM | POA: Diagnosis not present

## 2017-02-02 DIAGNOSIS — R9439 Abnormal result of other cardiovascular function study: Secondary | ICD-10-CM | POA: Insufficient documentation

## 2017-02-02 DIAGNOSIS — J929 Pleural plaque without asbestos: Secondary | ICD-10-CM | POA: Insufficient documentation

## 2017-02-02 DIAGNOSIS — R918 Other nonspecific abnormal finding of lung field: Secondary | ICD-10-CM | POA: Diagnosis not present

## 2017-02-02 LAB — BASIC METABOLIC PANEL
ANION GAP: 8 (ref 5–15)
BUN: 27 mg/dL — AB (ref 6–20)
CO2: 25 mmol/L (ref 22–32)
Calcium: 9.1 mg/dL (ref 8.9–10.3)
Chloride: 100 mmol/L — ABNORMAL LOW (ref 101–111)
Creatinine, Ser: 1.06 mg/dL (ref 0.61–1.24)
GFR calc Af Amer: >60 mL/min (ref >60)
GFR calc non Af Amer: >60 mL/min (ref >60)
GLUCOSE: 116 mg/dL — AB (ref 65–99)
POTASSIUM: 4.7 mmol/L (ref 3.5–5.1)
Sodium: 133 mmol/L — ABNORMAL LOW (ref 135–145)

## 2017-02-02 LAB — CBC
HEMATOCRIT: 33.2 % — AB (ref 39.0–52.0)
Hemoglobin: 11.4 g/dL — ABNORMAL LOW (ref 13.0–17.0)
MCH: 30.3 pg (ref 26.0–34.0)
MCHC: 34.3 g/dL (ref 30.0–36.0)
MCV: 88.3 fL (ref 78.0–100.0)
PLATELETS: 224 10*3/uL (ref 150–400)
RBC: 3.76 MIL/uL — ABNORMAL LOW (ref 4.22–5.81)
RDW: 12.5 % (ref 11.5–15.5)
WBC: 6.1 10*3/uL (ref 4.0–10.5)

## 2017-02-02 LAB — APTT: aPTT: 36 seconds (ref 24–36)

## 2017-02-02 LAB — PROTIME-INR
INR: 1.06
Prothrombin Time: 13.9 seconds (ref 11.4–15.2)

## 2017-02-02 NOTE — Telephone Encounter (Signed)
Patient contacted pre-catheterization at [facility] scheduled for: 02/04/2017 @ 0730 Verified arrival time and place: NT @ 0530 Confirmed AM meds to be taken pre-cath with sip of water:  Notified to take ASA prior to arrival.  Pt notified that last dose of metformin will be this evening.  Pt will hold Wednesday and for 48 hours s/p procedure.  Pt familiar with this from prior caths.  Notified Pt to hold Januvia day of procedure.  Notified Pt to take half normal dose of Lantus night prior to cath.  Pt indicates understanding. Confirmed patient has responsible person to drive home post procedure and observe patient for 24 hours: wife Addl concerns:  Pt concerned, previous hospitalizations Pt was unable to get correct eye drops.  Notified Pt to have wife bring with her in case of having to be hospitalized.  Notified Pt that he may not have to stay overnight.  Pt indicates understanding.

## 2017-02-03 ENCOUNTER — Telehealth: Payer: Self-pay | Admitting: *Deleted

## 2017-02-03 NOTE — Telephone Encounter (Signed)
Patient notified.  Copy to pmd.  Follow up already scheduled for 03/04/2017.  (pre-cath labs & x-ray).

## 2017-02-03 NOTE — Telephone Encounter (Signed)
CHEST X-RAY: Notes recorded by Herminio Commons, MD on 02/02/2017 at 9:46 AM EDT No active disease. Pleural thickening vs pleural plaque which unchanged. Forward to PCP.  LABS:   Notes recorded by Herminio Commons, MD on 02/02/2017 at 9:46 AM EDT Ok.

## 2017-02-04 ENCOUNTER — Encounter (HOSPITAL_COMMUNITY): Payer: Self-pay | Admitting: Cardiovascular Disease

## 2017-02-04 ENCOUNTER — Ambulatory Visit (HOSPITAL_COMMUNITY)
Admission: RE | Admit: 2017-02-04 | Discharge: 2017-02-04 | Disposition: A | Discharge status: Home / Self Care | Payer: Medicare Other | Source: Ambulatory Visit | Attending: Cardiovascular Disease | Admitting: Cardiovascular Disease

## 2017-02-04 ENCOUNTER — Ambulatory Visit (HOSPITAL_COMMUNITY)
Admission: RE | Disposition: A | Discharge status: Home / Self Care | Payer: Self-pay | Source: Ambulatory Visit | Attending: Cardiovascular Disease

## 2017-02-04 DIAGNOSIS — I2510 Atherosclerotic heart disease of native coronary artery without angina pectoris: Secondary | ICD-10-CM

## 2017-02-04 DIAGNOSIS — E785 Hyperlipidemia, unspecified: Secondary | ICD-10-CM | POA: Insufficient documentation

## 2017-02-04 DIAGNOSIS — Z87891 Personal history of nicotine dependence: Secondary | ICD-10-CM | POA: Insufficient documentation

## 2017-02-04 DIAGNOSIS — R9439 Abnormal result of other cardiovascular function study: Secondary | ICD-10-CM | POA: Diagnosis present

## 2017-02-04 DIAGNOSIS — Z882 Allergy status to sulfonamides status: Secondary | ICD-10-CM | POA: Insufficient documentation

## 2017-02-04 DIAGNOSIS — I209 Angina pectoris, unspecified: Secondary | ICD-10-CM

## 2017-02-04 DIAGNOSIS — M170 Bilateral primary osteoarthritis of knee: Secondary | ICD-10-CM | POA: Insufficient documentation

## 2017-02-04 DIAGNOSIS — Z794 Long term (current) use of insulin: Secondary | ICD-10-CM | POA: Diagnosis not present

## 2017-02-04 DIAGNOSIS — Z91040 Latex allergy status: Secondary | ICD-10-CM | POA: Insufficient documentation

## 2017-02-04 DIAGNOSIS — Z7982 Long term (current) use of aspirin: Secondary | ICD-10-CM | POA: Diagnosis not present

## 2017-02-04 DIAGNOSIS — Z8673 Personal history of transient ischemic attack (TIA), and cerebral infarction without residual deficits: Secondary | ICD-10-CM | POA: Insufficient documentation

## 2017-02-04 DIAGNOSIS — E119 Type 2 diabetes mellitus without complications: Secondary | ICD-10-CM | POA: Insufficient documentation

## 2017-02-04 DIAGNOSIS — I10 Essential (primary) hypertension: Secondary | ICD-10-CM | POA: Insufficient documentation

## 2017-02-04 DIAGNOSIS — Z955 Presence of coronary angioplasty implant and graft: Secondary | ICD-10-CM | POA: Diagnosis not present

## 2017-02-04 DIAGNOSIS — I252 Old myocardial infarction: Secondary | ICD-10-CM | POA: Insufficient documentation

## 2017-02-04 HISTORY — PX: CORONARY ANGIOGRAPHY: CATH118303

## 2017-02-04 LAB — GLUCOSE, CAPILLARY: GLUCOSE-CAPILLARY: 150 mg/dL — AB (ref 65–99)

## 2017-02-04 SURGERY — CORONARY ANGIOGRAPHY (CATH LAB)
Anesthesia: LOCAL

## 2017-02-04 MED ORDER — HEPARIN SODIUM (PORCINE) 1000 UNIT/ML IJ SOLN
INTRAMUSCULAR | Status: AC
  Filled 2017-02-04: qty 1

## 2017-02-04 MED ORDER — HEPARIN (PORCINE) IN NACL 2-0.9 UNIT/ML-% IJ SOLN
INTRAMUSCULAR | Status: DC | PRN
  Administered 2017-02-04: 08:00:00

## 2017-02-04 MED ORDER — FENTANYL CITRATE (PF) 100 MCG/2ML IJ SOLN
INTRAMUSCULAR | Status: AC
  Filled 2017-02-04: qty 2

## 2017-02-04 MED ORDER — SODIUM CHLORIDE 0.9% FLUSH: 3.0000 mL | INTRAVENOUS | Status: DC | PRN

## 2017-02-04 MED ORDER — MIDAZOLAM HCL 2 MG/2ML IJ SOLN
INTRAMUSCULAR | Status: DC | PRN
  Administered 2017-02-04: 2 mg via INTRAVENOUS

## 2017-02-04 MED ORDER — SODIUM CHLORIDE 0.9 % WEIGHT BASED INFUSION
3.0000 mL/kg/h | INTRAVENOUS | Status: DC
  Administered 2017-02-04: 3 mL/kg/h via INTRAVENOUS

## 2017-02-04 MED ORDER — HEPARIN SODIUM (PORCINE) 1000 UNIT/ML IJ SOLN
INTRAMUSCULAR | Status: DC | PRN
  Administered 2017-02-04: 5000 [IU] via INTRAVENOUS

## 2017-02-04 MED ORDER — HEPARIN (PORCINE) IN NACL 2-0.9 UNIT/ML-% IJ SOLN
INTRAMUSCULAR | Status: AC
  Filled 2017-02-04: qty 1000

## 2017-02-04 MED ORDER — ONDANSETRON HCL 4 MG/2ML IJ SOLN: 4.0000 mg | Freq: Four times a day (QID) | INTRAMUSCULAR | Status: DC | PRN

## 2017-02-04 MED ORDER — ASPIRIN 81 MG PO CHEW: 81.0000 mg | CHEWABLE_TABLET | Freq: Every day | ORAL | Status: DC

## 2017-02-04 MED ORDER — SODIUM CHLORIDE 0.9 % IV SOLN: 250.0000 mL | INTRAVENOUS | Status: DC | PRN

## 2017-02-04 MED ORDER — ACETAMINOPHEN 325 MG PO TABS: 650.0000 mg | ORAL_TABLET | ORAL | Status: DC | PRN

## 2017-02-04 MED ORDER — IOPAMIDOL (ISOVUE-370) INJECTION 76%
INTRAVENOUS | Status: DC | PRN
  Administered 2017-02-04: 60 mL via INTRAVENOUS

## 2017-02-04 MED ORDER — SODIUM CHLORIDE 0.9% FLUSH: 3.0000 mL | Freq: Two times a day (BID) | INTRAVENOUS | Status: DC

## 2017-02-04 MED ORDER — MIDAZOLAM HCL 2 MG/2ML IJ SOLN
INTRAMUSCULAR | Status: AC
  Filled 2017-02-04: qty 2

## 2017-02-04 MED ORDER — VERAPAMIL HCL 2.5 MG/ML IV SOLN
INTRAVENOUS | Status: DC | PRN
  Administered 2017-02-04: 10 mL via INTRA_ARTERIAL

## 2017-02-04 MED ORDER — FENTANYL CITRATE (PF) 100 MCG/2ML IJ SOLN
INTRAMUSCULAR | Status: DC | PRN
  Administered 2017-02-04: 25 ug via INTRAVENOUS

## 2017-02-04 MED ORDER — DIAZEPAM 5 MG PO TABS: 5.0000 mg | ORAL_TABLET | ORAL | Status: DC | PRN

## 2017-02-04 MED ORDER — LIDOCAINE HCL (PF) 1 % IJ SOLN
INTRAMUSCULAR | Status: DC | PRN
  Administered 2017-02-04: 2 mL

## 2017-02-04 MED ORDER — SODIUM CHLORIDE 0.9 % WEIGHT BASED INFUSION: 1.0000 mL/kg/h | INTRAVENOUS | Status: DC

## 2017-02-04 MED ORDER — VERAPAMIL HCL 2.5 MG/ML IV SOLN
INTRAVENOUS | Status: AC
  Filled 2017-02-04: qty 2

## 2017-02-04 MED ORDER — ASPIRIN 81 MG PO CHEW: 81.0000 mg | CHEWABLE_TABLET | ORAL | Status: DC

## 2017-02-04 MED ORDER — LIDOCAINE HCL (PF) 1 % IJ SOLN
INTRAMUSCULAR | Status: AC
  Filled 2017-02-04: qty 30

## 2017-02-04 SURGICAL SUPPLY — 14 items
CATH EXPO 5FR FR4 (CATHETERS) ×1 IMPLANT
CATH INFINITI 5FR ANG PIGTAIL (CATHETERS) ×1 IMPLANT
CATH INFINITI 5FR JR4 125CM (CATHETERS) ×1 IMPLANT
CATH INFINITI 5FR MPB2 (CATHETERS) ×1 IMPLANT
CATH OPTITORQUE TIG 4.0 5F (CATHETERS) ×1 IMPLANT
DEVICE RAD COMP TR BAND LRG (VASCULAR PRODUCTS) ×1 IMPLANT
GLIDESHEATH SLEND SS 6F .021 (SHEATH) ×1 IMPLANT
GUIDEWIRE INQWIRE 1.5J.035X260 (WIRE) IMPLANT
INQWIRE 1.5J .035X260CM (WIRE) ×2
KIT HEART LEFT (KITS) ×2 IMPLANT
PACK CARDIAC CATHETERIZATION (CUSTOM PROCEDURE TRAY) ×2 IMPLANT
SYR MEDRAD MARK V 150ML (SYRINGE) ×1 IMPLANT
TRANSDUCER W/STOPCOCK (MISCELLANEOUS) ×2 IMPLANT
TUBING CIL FLEX 10 FLL-RA (TUBING) ×2 IMPLANT

## 2017-02-04 NOTE — H&P (View-Only) (Signed)
SUBJECTIVE: The patient presents for follow-up of coronary artery disease. He has distal small vessel coronary disease and hypertension. He has a prior history of LAD stent placement most recently in September 2016 for in-stent restenosis.  He saw K. Lawrence DNP on 01/14/17 and complained of chest pressure and shortness of breath. She ordered a nuclear stress test.  This demonstrated prior inferior, inferoseptal, and inferoapical myocardial infarction with moderate peri-infarct ischemia. It was deemed an intermediate risk study.  He has been experiencing chest pressure and exertional dyspnea similar to prior anginal symptoms. He has a half-mile which he walks at home and has been getting progressively more short of breath on the incline. His systolic blood pressure is usually in the 120 range and today is 152/73.   Review of Systems: As per "subjective", otherwise negative.  Allergies  Allergen Reactions  . Sulfa Antibiotics Other (See Comments)    "was terrible" "eye went crazy"  . Latex Other (See Comments)    bandaids cause blisters after 24 hours     Current Outpatient Prescriptions  Medication Sig Dispense Refill  . albuterol (PROVENTIL HFA;VENTOLIN HFA) 108 (90 Base) MCG/ACT inhaler Inhale 2 puffs into the lungs every 6 (six) hours as needed for wheezing or shortness of breath. 1 Inhaler 2  . amLODipine (NORVASC) 10 MG tablet TAKE 1 TABLET BY MOUTH ONCE A DAY. 30 tablet 11  . aspirin EC 81 MG tablet Take 81 mg by mouth daily.    Marland Kitchen atorvastatin (LIPITOR) 80 MG tablet Take 1 tablet (80 mg total) by mouth daily. 90 tablet 3  . carvedilol (COREG) 6.25 MG tablet TAKE (1) TABLET BY MOUTH TWICE DAILY. 180 tablet 3  . COMBIGAN 0.2-0.5 % ophthalmic solution Place 1 drop into the left eye 2 (two) times daily.   2  . dutasteride (AVODART) 0.5 MG capsule TAKE (1) CAPSULE BY MOUTH ONCE DAILY. 90 capsule 3  . glucose blood (BAYER CONTOUR NEXT TEST) test strip USE ONE STRIP TO CHECK  GLUCOSE TWICE DAILY DX- E11.9 100 each 11  . Insulin Glargine (LANTUS SOLOSTAR) 100 UNIT/ML Solostar Pen Inject 22 Units into the skin daily at 10 pm. 15 mL 5  . JANUVIA 50 MG tablet TAKE ONE TABLET BY MOUTH ONCE DAILY. 30 tablet 0  . metFORMIN (GLUCOPHAGE) 1000 MG tablet TAKE (1) TABLET BY MOUTH TWICE A DAY WITH A MEAL. 180 tablet 0  . mometasone (ELOCON) 0.1 % cream Apply to affected area daily (Patient taking differently: Apply 1 application topically daily as needed. For rash/irritated skin.) 45 g 3  . Multiple Vitamin (MULTIVITAMIN) tablet Take 1 tablet by mouth daily.    . nitroGLYCERIN (NITROSTAT) 0.4 MG SL tablet PLACE 1 TAB UNDER TONGUE EVERY 5 MIN IF NEEDED FOR CHEST PAIN. MAY USE 3 TIMES.NO RELIEF CALL 911. 25 tablet 3  . NON FORMULARY Flax seed   One daily    . psyllium (METAMUCIL) 58.6 % powder Take 1 packet by mouth daily.     . quinapril (ACCUPRIL) 40 MG tablet TAKE (1) TABLET BY MOUTH AT BEDTIME. 90 tablet 3  . tamsulosin (FLOMAX) 0.4 MG CAPS capsule TAKE (1) CAPSULE BY MOUTH ONCE DAILY. 90 capsule 0   No current facility-administered medications for this visit.     Past Medical History:  Diagnosis Date  . Anginal pain ([place])   . Arthritis    Bilateral knee  . Coronary artery disease   . Diabetes mellitus    insulin dependent   .  Hyperlipidemia   . Hypertension   . Progressive angina ([place]) 03/2015   3.0x12 mm Resolute DES to pLAD, Angiosculpt scoring balloon to dLAD  . Stroke [facility])     Past Surgical History:  Procedure Laterality Date  . APPENDECTOMY    . CARDIAC CATHETERIZATION N/A 04/12/2015   Procedure: Left Heart Cath and Coronary Angiography;  Surgeon: Sherren Mocha, MD; pLAD 75>0% w/ 3.0x12 mm Resolute DES, dLAD 80>0% w/ Angiosculpt scoring balloon, CFX luminal irreg, RCA mild dz, EF 55-65%   . COLONOSCOPY  2011   RMR: 1. Normal rectum 2. Left sided diverticula , 2 ascending colon polyps, status post snare polypectomy. Remainder of colonic mucosa appeared  unremarkable.   . COLONOSCOPY N/A 05/13/2016   Procedure: COLONOSCOPY;  Surgeon: Daneil Dolin, MD;  [facility];  Service: Endoscopy;  Laterality: N/A;  7:30 AM  . CORONARY ANGIOPLASTY WITH STENT PLACEMENT  08/22/2014   PTCA/DES X mLAD with 2.5 x 16 mm Promus Premier DES by Dr Julianne Handler  . FOOT SURGERY Right   . LEFT HEART CATHETERIZATION WITH CORONARY ANGIOGRAM N/A 08/22/2014   Procedure: LEFT HEART CATHETERIZATION WITH CORONARY ANGIOGRAM;  Surgeon: Burnell Blanks, MD;  Location: [facility] CATH LAB;  Service: Cardiovascular;  Laterality: N/A;  . PERCUTANEOUS CORONARY STENT INTERVENTION (PCI-S)  08/22/2014   Procedure: PERCUTANEOUS CORONARY STENT INTERVENTION (PCI-S);  Surgeon: Burnell Blanks, MD;   . SHOULDER SURGERY    . TONSILLECTOMY      Social History   Social History  . Marital status: Married    Spouse name: N/A  . Number of children: N/A  . Years of education: 26   Occupational History  . retired    Social History Main Topics  . Smoking status: Former Smoker    Packs/day: 1.00    Years: 20.00    Types: Cigarettes, Cigars    Start date: 08/10/1955    Quit date: 07/28/2007  . Smokeless tobacco: Never Used     Comment: Quit x 15 years  . Alcohol use 0.0 oz/week     Comment: Daily about 1 glass of wine  . Drug use: No  . Sexual activity: Not Currently   Other Topics Concern  . Not on file   Social History Narrative  . No narrative on file     Vitals:   02/01/17 1444  BP: (!) 152/73  Pulse: 60  SpO2: 97%  Weight: 216 lb (98 kg)  Height: 5\' 11"  (1.803 m)    Wt Readings from Last 3 Encounters:  02/01/17 216 lb (98 kg)  01/14/17 214 lb (97.1 kg)  12/24/16 218 lb (98.9 kg)     PHYSICAL EXAM General: NAD HEENT: Normal. Neck: No JVD, no thyromegaly. Lungs: Clear to auscultation bilaterally with normal respiratory effort. CV: Nondisplaced PMI.  Regular rate and rhythm, normal S1/S2, no S3/S4, no murmur. No pretibial or periankle edema.   No carotid bruit.   Abdomen: Soft, nontender, no distention.  Neurologic: Alert and oriented.  Psych: Normal affect. Skin: Normal. Musculoskeletal: No gross deformities.    ECG: Most recent ECG reviewed.   Labs: Lab Results  Component Value Date/Time   K 4.8 12/22/2016 07:48 AM   BUN 24 12/22/2016 07:48 AM   CREATININE 1.10 12/22/2016 07:48 AM   CREATININE 0.93 04/23/2014 07:04 AM   ALT 18 12/22/2016 07:48 AM   TSH 2.870 09/10/2015 08:17 AM   HGB 11.5 (L) 12/22/2016 07:48 AM     Lipids: Lab Results  Component Value Date/Time  [place] 81 12/22/2016 07:48 AM   CHOL 155 12/22/2016 07:48 AM   TRIG 72 12/22/2016 07:48 AM   HDL 60 12/22/2016 07:48 AM       ASSESSMENT AND PLAN: 1. CAD with history of prior LAD stent and current angina with abnormal stress test: Moderate peri-infarct ischemia as noted above. Currently taking aspirin, Lipitor, and carvedilol. Heart rate is adequately controlled, 60 bpm. Symptoms are similar to prior anginal symptoms. I will arrange for coronary angiography. Risks and benefits of cardiac catheterization have been discussed with the patient.  These include bleeding, infection, kidney damage, stroke, heart attack, death.  The patient understands these risks and is willing to proceed.  2. Essential HTN: Elevated. Systolic readings are normally in the 120 range. I will monitor.  3. Hyperlipidemia: Continue Lipitor.    Disposition: Follow up 1 month.  Time spent: 40 minutes, of which greater than 50% was spent reviewing symptoms, relevant blood tests and studies, and discussing management plan with the patient.    Kate Sable, M.D., F.A.C.C.

## 2017-02-04 NOTE — Discharge Instructions (Signed)

## 2017-02-04 NOTE — Interval H&P Note (Signed)
Cath Lab Visit (complete for each Cath Lab visit)  Clinical Evaluation Leading to the Procedure:   ACS: No.  Non-ACS:    Anginal Classification: CCS III  Anti-ischemic medical therapy: Maximal Therapy (2 or more classes of medications)  Non-Invasive Test Results: Intermediate-risk stress test findings: cardiac mortality 1-3%/year  Prior CABG: No previous CABG      History and Physical Interval Note:  02/04/2017 7:33 AM  Jerry Burns  has presented today for surgery, with the diagnosis of abn stress  The various methods of treatment have been discussed with the patient and family. After consideration of risks, benefits and other options for treatment, the patient has consented to  Procedure(s): Left Heart Cath and Coronary Angiography (N/A) as a surgical intervention .  The patient's history has been reviewed, patient examined, no change in status, stable for surgery.  I have reviewed the patient's chart and labs.  Questions were answered to the patient's satisfaction.     Shelva Majestic

## 2017-02-05 MED FILL — Heparin Sodium (Porcine) Inj 1000 Unit/ML: INTRAMUSCULAR | Qty: 10 | Status: AC

## 2017-02-13 ENCOUNTER — Other Ambulatory Visit: Payer: Self-pay | Admitting: Family Medicine

## 2017-02-18 ENCOUNTER — Telehealth: Payer: Self-pay | Admitting: Family Medicine

## 2017-02-18 ENCOUNTER — Other Ambulatory Visit: Payer: Self-pay

## 2017-02-18 ENCOUNTER — Other Ambulatory Visit: Payer: Self-pay | Admitting: *Deleted

## 2017-02-18 MED ORDER — GLUCOSE BLOOD VI STRP: ORAL_STRIP | 5 refills | Status: DC

## 2017-02-18 MED ORDER — GLUCOSE BLOOD VI STRP: ORAL_STRIP | 11 refills | Status: DC

## 2017-02-18 NOTE — Telephone Encounter (Signed)
Spoke with patient and informed him that prescription for diabetic testing strips was sent into pharmacy with Dx code. Patient verbalized understanding.

## 2017-02-18 NOTE — Telephone Encounter (Signed)
Pt need Rx sent to [facility] for his diabetic test strips  States that the pharmacy says we sent it in without the Dx code (requirement for insurance)  Please send Rx in with Dx code   Pt is out & needs to get filled today

## 2017-02-19 DIAGNOSIS — H02115 Cicatricial ectropion of left lower eyelid: Secondary | ICD-10-CM | POA: Diagnosis not present

## 2017-03-03 ENCOUNTER — Encounter: Payer: Self-pay | Admitting: *Deleted

## 2017-03-04 ENCOUNTER — Ambulatory Visit (INDEPENDENT_AMBULATORY_CARE_PROVIDER_SITE_OTHER): Payer: Medicare Other | Admitting: Cardiovascular Disease

## 2017-03-04 ENCOUNTER — Encounter: Payer: Self-pay | Admitting: Cardiovascular Disease

## 2017-03-04 VITALS — BP 114/60 | HR 61 | Ht 72.0 in | Wt 216.0 lb

## 2017-03-04 DIAGNOSIS — I209 Angina pectoris, unspecified: Secondary | ICD-10-CM | POA: Diagnosis not present

## 2017-03-04 DIAGNOSIS — E785 Hyperlipidemia, unspecified: Secondary | ICD-10-CM | POA: Diagnosis not present

## 2017-03-04 DIAGNOSIS — I25118 Atherosclerotic heart disease of native coronary artery with other forms of angina pectoris: Secondary | ICD-10-CM | POA: Diagnosis not present

## 2017-03-04 DIAGNOSIS — Z955 Presence of coronary angioplasty implant and graft: Secondary | ICD-10-CM | POA: Diagnosis not present

## 2017-03-04 DIAGNOSIS — I10 Essential (primary) hypertension: Secondary | ICD-10-CM | POA: Diagnosis not present

## 2017-03-04 NOTE — Progress Notes (Signed)
SUBJECTIVE: The patient presents for follow-up after undergoing coronary angiography which demonstrated mild residual coronary artery disease with a patent proximal LAD stent.  He was initially very angry because he felt he didn't really need the procedure. However, after a thoughtful and detailed explanation regarding his symptoms and abnormal stress test, he calmed down and apologized.  He has a prior history of tobacco abuse. He denies cough and wheezing. He does have some exertional dyspnea which is not lifestyle limiting. He denies chest pain.   Review of Systems: As per "subjective", otherwise negative.  Allergies  Allergen Reactions  . Sulfa Antibiotics Other (See Comments)    "was terrible" "eye went crazy"  . Latex Other (See Comments)    bandaids cause blisters after 24 hours     Current Outpatient Prescriptions  Medication Sig Dispense Refill  . albuterol (PROVENTIL HFA;VENTOLIN HFA) 108 (90 Base) MCG/ACT inhaler Inhale 2 puffs into the lungs every 6 (six) hours as needed for wheezing or shortness of breath. 1 Inhaler 2  . amLODipine (NORVASC) 10 MG tablet TAKE 1 TABLET BY MOUTH ONCE A DAY. 30 tablet 11  . aspirin EC 81 MG tablet Take 81 mg by mouth daily.    Marland Kitchen atorvastatin (LIPITOR) 80 MG tablet Take 1 tablet (80 mg total) by mouth daily. 90 tablet 3  . carvedilol (COREG) 6.25 MG tablet TAKE (1) TABLET BY MOUTH TWICE DAILY. 180 tablet 3  . COMBIGAN 0.2-0.5 % ophthalmic solution Place 1 drop into the left eye 2 (two) times daily.   2  . dutasteride (AVODART) 0.5 MG capsule TAKE (1) CAPSULE BY MOUTH ONCE DAILY. 90 capsule 3  . Emollient (CERAVE) CREA Apply 1 application topically daily as needed (dry skin).    Marland Kitchen erythromycin ophthalmic ointment Place 1 application into the left eye 3 (three) times daily.    . Flaxseed, Linseed, (FLAX SEED OIL) 1000 MG CAPS Take 1,000 mg by mouth daily.    Marland Kitchen glucose blood (CONTOUR NEXT TEST) test strip USE ONE STRIP TO CHECK GLUCOSE  TWICE DAILY. DX E11.9 100 each 5  . Insulin Glargine (LANTUS SOLOSTAR) 100 UNIT/ML Solostar Pen Inject 22 Units into the skin daily at 10 pm. 15 mL 5  . JANUVIA 50 MG tablet TAKE ONE TABLET BY MOUTH ONCE DAILY. 30 tablet 0  . metFORMIN (GLUCOPHAGE) 1000 MG tablet TAKE (1) TABLET BY MOUTH TWICE A DAY WITH A MEAL. 180 tablet 0  . Multiple Vitamin (MULTIVITAMIN) tablet Take 1 tablet by mouth daily.    . nitroGLYCERIN (NITROSTAT) 0.4 MG SL tablet PLACE 1 TAB UNDER TONGUE EVERY 5 MIN IF NEEDED FOR CHEST PAIN. MAY USE 3 TIMES.NO RELIEF CALL 911. 25 tablet 3  . Polyvinyl Alcohol-Povidone (REFRESH OP) Place 1 drop into the left eye 2 (two) times daily.    . psyllium (METAMUCIL) 58.6 % powder Take 1 packet by mouth daily.     . quinapril (ACCUPRIL) 40 MG tablet TAKE (1) TABLET BY MOUTH AT BEDTIME. 90 tablet 3  . tamsulosin (FLOMAX) 0.4 MG CAPS capsule TAKE (1) CAPSULE BY MOUTH ONCE DAILY. 90 capsule 0   No current facility-administered medications for this visit.     Past Medical History:  Diagnosis Date  . Anginal pain ([place])   . Arthritis    Bilateral knee  . Coronary artery disease   . Diabetes mellitus    insulin dependent   . Hyperlipidemia   . Hypertension   . Progressive angina ([place]) 03/2015  3.0x12 mm Resolute DES to pLAD, Angiosculpt scoring balloon to dLAD  . Stroke [facility])     Past Surgical History:  Procedure Laterality Date  . APPENDECTOMY    . CARDIAC CATHETERIZATION N/A 04/12/2015   Procedure: Left Heart Cath and Coronary Angiography;  Surgeon: Sherren Mocha, MD; pLAD 75>0% w/ 3.0x12 mm Resolute DES, dLAD 80>0% w/ Angiosculpt scoring balloon, CFX luminal irreg, RCA mild dz, EF 55-65%   . COLONOSCOPY  2011   RMR: 1. Normal rectum 2. Left sided diverticula , 2 ascending colon polyps, status post snare polypectomy. Remainder of colonic mucosa appeared unremarkable.   . COLONOSCOPY N/A 05/13/2016   Procedure: COLONOSCOPY;  Surgeon: Daneil Dolin, MD;  [facility];   Service: Endoscopy;  Laterality: N/A;  7:30 AM  . CORONARY ANGIOGRAPHY N/A 02/04/2017   Procedure: CORONARY ANGIOGRAPHY (CATH LAB);  Surgeon: Troy Sine, MD;  Location: [facility] CV LAB;  Service: Cardiovascular;  Laterality: N/A;  . CORONARY ANGIOPLASTY WITH STENT PLACEMENT  08/22/2014   PTCA/DES X mLAD with 2.5 x 16 mm Promus Premier DES by Dr Julianne Handler  . FOOT SURGERY Right   . LEFT HEART CATHETERIZATION WITH CORONARY ANGIOGRAM N/A 08/22/2014   Procedure: LEFT HEART CATHETERIZATION WITH CORONARY ANGIOGRAM;  Surgeon: Burnell Blanks, MD;  Location: [facility] CATH LAB;  Service: Cardiovascular;  Laterality: N/A;  . PERCUTANEOUS CORONARY STENT INTERVENTION (PCI-S)  08/22/2014   Procedure: PERCUTANEOUS CORONARY STENT INTERVENTION (PCI-S);  Surgeon: Burnell Blanks, MD;   . SHOULDER SURGERY    . TONSILLECTOMY      Social History   Social History  . Marital status: Married    Spouse name: N/A  . Number of children: N/A  . Years of education: 58   Occupational History  . retired    Social History Main Topics  . Smoking status: Former Smoker    Packs/day: 1.00    Years: 20.00    Types: Cigarettes, Cigars    Start date: 08/10/1955    Quit date: 07/28/2007  . Smokeless tobacco: Never Used     Comment: Quit x 15 years  . Alcohol use 0.0 oz/week     Comment: Daily about 1 glass of wine  . Drug use: No  . Sexual activity: Not Currently   Other Topics Concern  . Not on file   Social History Narrative  . No narrative on file     Vitals:   03/04/17 0815  BP: 114/60  Pulse: 61  SpO2: 98%  Weight: 216 lb (98 kg)  Height: 6' (1.829 m)    Wt Readings from Last 3 Encounters:  03/04/17 216 lb (98 kg)  02/04/17 215 lb (97.5 kg)  02/01/17 216 lb (98 kg)     PHYSICAL EXAM General: NAD HEENT: Normal. Neck: No JVD, no thyromegaly. Lungs: Clear to auscultation bilaterally with normal respiratory effort. CV: Nondisplaced PMI.  Regular rate and rhythm, normal S1/S2,  no S3/S4, no murmur. No pretibial or periankle edema.  No carotid bruit.   Abdomen: Soft, nontender, no distention.  Neurologic: Alert and oriented.  Psych: Normal affect. Skin: Normal. Musculoskeletal: No gross deformities.    ECG: Most recent ECG reviewed.   Labs: Lab Results  Component Value Date/Time   K 4.7 02/02/2017 08:35 AM   BUN 27 (H) 02/02/2017 08:35 AM   BUN 24 12/22/2016 07:48 AM   CREATININE 1.06 02/02/2017 08:35 AM   CREATININE 0.93 04/23/2014 07:04 AM   ALT 18 12/22/2016 07:48 AM   TSH  2.870 09/10/2015 08:17 AM   HGB 11.4 (L) 02/02/2017 08:35 AM   HGB 11.5 (L) 12/22/2016 07:48 AM     Lipids: Lab Results  Component Value Date/Time   LDLCALC 81 12/22/2016 07:48 AM   CHOL 155 12/22/2016 07:48 AM   TRIG 72 12/22/2016 07:48 AM   HDL 60 12/22/2016 07:48 AM       ASSESSMENT AND PLAN:  1. Coronary artery disease with patent proximal LAD stent: Stable ischemic heart disease. No significant stenoses by recent coronary angiography. Continue current medical therapy.  2. Hypertension: Controlled. No changes.  3. Hyperlipidemia: Continue statin therapy.  4. Exertional dyspnea: Given prior history of tobacco abuse, consider spirometry to evaluate for COPD if symptoms persist or progress.     Disposition: Follow up 1 yr   Kate Sable, M.D., F.A.C.C.

## 2017-03-04 NOTE — Patient Instructions (Signed)

## 2017-03-15 ENCOUNTER — Other Ambulatory Visit: Payer: Self-pay | Admitting: Family Medicine

## 2017-04-15 ENCOUNTER — Telehealth: Payer: Self-pay | Admitting: Family Medicine

## 2017-04-15 DIAGNOSIS — E1159 Type 2 diabetes mellitus with other circulatory complications: Secondary | ICD-10-CM

## 2017-04-15 DIAGNOSIS — E7849 Other hyperlipidemia: Secondary | ICD-10-CM

## 2017-04-15 DIAGNOSIS — D649 Anemia, unspecified: Secondary | ICD-10-CM

## 2017-04-15 DIAGNOSIS — I10 Essential (primary) hypertension: Secondary | ICD-10-CM

## 2017-04-15 NOTE — Telephone Encounter (Signed)
CBC, metabolic 7, lipid, liver, A1c-anemia, hyperlipidemia hypertension

## 2017-04-15 NOTE — Telephone Encounter (Signed)
Patient has a 4 mos f/u scheduled on October 1st.  He said he is suppose to have labwork done.  He does have some orders in the system but I am not sure if they are the correct ones for his appt.  Please call patient and let him know when it is ok to go have labs done at [facility].

## 2017-04-16 NOTE — Telephone Encounter (Signed)
Spoke with patient and informed him per Dr.Scott Luking- Blood work was ordered for your upcoming appointment. Patient verbalized understanding.

## 2017-04-20 DIAGNOSIS — I10 Essential (primary) hypertension: Secondary | ICD-10-CM | POA: Diagnosis not present

## 2017-04-20 DIAGNOSIS — D649 Anemia, unspecified: Secondary | ICD-10-CM | POA: Diagnosis not present

## 2017-04-20 DIAGNOSIS — E784 Other hyperlipidemia: Secondary | ICD-10-CM | POA: Diagnosis not present

## 2017-04-20 DIAGNOSIS — E1159 Type 2 diabetes mellitus with other circulatory complications: Secondary | ICD-10-CM | POA: Diagnosis not present

## 2017-04-21 LAB — HEPATIC FUNCTION PANEL
ALBUMIN: 4.7 g/dL (ref 3.5–4.7)
ALK PHOS: 79 IU/L (ref 39–117)
ALT: 14 IU/L (ref 0–44)
AST: 22 IU/L (ref 0–40)
BILIRUBIN TOTAL: 0.5 mg/dL (ref 0.0–1.2)
BILIRUBIN, DIRECT: 0.13 mg/dL (ref 0.00–0.40)
TOTAL PROTEIN: 7.3 g/dL (ref 6.0–8.5)

## 2017-04-21 LAB — CBC WITH DIFFERENTIAL/PLATELET
BASOS: 1 %
Basophils Absolute: 0 10*3/uL (ref 0.0–0.2)
EOS (ABSOLUTE): 0.4 10*3/uL (ref 0.0–0.4)
Eos: 7 %
Hematocrit: 36.7 % — ABNORMAL LOW (ref 37.5–51.0)
Hemoglobin: 11.9 g/dL — ABNORMAL LOW (ref 13.0–17.7)
IMMATURE GRANULOCYTES: 0 %
Immature Grans (Abs): 0 10*3/uL (ref 0.0–0.1)
Lymphocytes Absolute: 0.9 10*3/uL (ref 0.7–3.1)
Lymphs: 18 %
MCH: 29.3 pg (ref 26.6–33.0)
MCHC: 32.4 g/dL (ref 31.5–35.7)
MCV: 90 fL (ref 79–97)
MONOS ABS: 0.6 10*3/uL (ref 0.1–0.9)
Monocytes: 11 %
NEUTROS PCT: 63 %
Neutrophils Absolute: 3.2 10*3/uL (ref 1.4–7.0)
PLATELETS: 253 10*3/uL (ref 150–379)
RBC: 4.06 x10E6/uL — ABNORMAL LOW (ref 4.14–5.80)
RDW: 13.1 % (ref 12.3–15.4)
WBC: 5.1 10*3/uL (ref 3.4–10.8)

## 2017-04-21 LAB — HEMOGLOBIN A1C
Est. average glucose Bld gHb Est-mCnc: 154 mg/dL
Hgb A1c MFr Bld: 7 % — ABNORMAL HIGH (ref 4.8–5.6)

## 2017-04-21 LAB — BASIC METABOLIC PANEL
BUN / CREAT RATIO: 20 (ref 10–24)
BUN: 20 mg/dL (ref 8–27)
CALCIUM: 9.9 mg/dL (ref 8.6–10.2)
CHLORIDE: 97 mmol/L (ref 96–106)
CO2: 24 mmol/L (ref 20–29)
Creatinine, Ser: 1 mg/dL (ref 0.76–1.27)
GFR calc non Af Amer: 70 mL/min/{1.73_m2} (ref >59)
GFR, EST AFRICAN AMERICAN: 81 mL/min/{1.73_m2} (ref >59)
Glucose: 184 mg/dL — ABNORMAL HIGH (ref 65–99)
POTASSIUM: 5.2 mmol/L (ref 3.5–5.2)
SODIUM: 136 mmol/L (ref 134–144)

## 2017-04-21 LAB — LIPID PANEL
Chol/HDL Ratio: 2.6 ratio (ref 0.0–5.0)
Cholesterol, Total: 154 mg/dL (ref 100–199)
HDL: 59 mg/dL (ref >39)
LDL Calculated: 73 mg/dL (ref 0–99)
Triglycerides: 111 mg/dL (ref 0–149)
VLDL Cholesterol Cal: 22 mg/dL (ref 5–40)

## 2017-04-26 ENCOUNTER — Encounter: Payer: Self-pay | Admitting: Family Medicine

## 2017-04-26 ENCOUNTER — Ambulatory Visit (INDEPENDENT_AMBULATORY_CARE_PROVIDER_SITE_OTHER): Payer: Medicare Other | Admitting: Family Medicine

## 2017-04-26 VITALS — BP 116/64 | Ht 72.0 in | Wt 216.4 lb

## 2017-04-26 DIAGNOSIS — E1159 Type 2 diabetes mellitus with other circulatory complications: Secondary | ICD-10-CM | POA: Diagnosis not present

## 2017-04-26 DIAGNOSIS — Z23 Encounter for immunization: Secondary | ICD-10-CM | POA: Diagnosis not present

## 2017-04-26 DIAGNOSIS — I10 Essential (primary) hypertension: Secondary | ICD-10-CM | POA: Diagnosis not present

## 2017-04-26 DIAGNOSIS — D638 Anemia in other chronic diseases classified elsewhere: Secondary | ICD-10-CM

## 2017-04-26 DIAGNOSIS — E1140 Type 2 diabetes mellitus with diabetic neuropathy, unspecified: Secondary | ICD-10-CM | POA: Diagnosis not present

## 2017-04-26 DIAGNOSIS — Z794 Long term (current) use of insulin: Secondary | ICD-10-CM

## 2017-04-26 DIAGNOSIS — I209 Angina pectoris, unspecified: Secondary | ICD-10-CM | POA: Diagnosis not present

## 2017-04-26 DIAGNOSIS — E7849 Other hyperlipidemia: Secondary | ICD-10-CM

## 2017-04-26 NOTE — Progress Notes (Signed)
   Subjective:    Patient ID: Jerry Burns, male    DOB: 12-25-1933, 83 y.o.   MRN: [id number]  Diabetes  He presents for his follow-up diabetic visit. He has type 2 diabetes mellitus. Pertinent negatives for hypoglycemia include no confusion. Pertinent negatives for diabetes include no chest pain, no fatigue, no polydipsia, no polyphagia and no weakness. Risk factors for coronary artery disease include diabetes mellitus, dyslipidemia and hypertension. Current diabetic treatment includes oral agent (dual therapy) and insulin injections. He is compliant with treatment all of the time. His weight is stable. He is following a diabetic diet.   A1c on recent lab work was 7 Hemoglobin actually coming up some with anemia of chronic disease Kidney function looks good Cholesterol liver looks good  Patient denies any heart symptoms currently no chest tightness pressure pain shortness of breath states physically active No falls states no sign and depression In addition to this patient denies low sugar spells Tolerates his medications well. Recently had lab work Takes his cholesterol medicine regular basis tolerates it well    Review of Systems  Constitutional: Negative for activity change, appetite change and fatigue.  HENT: Negative for congestion.   Respiratory: Negative for cough.   Cardiovascular: Negative for chest pain.  Gastrointestinal: Negative for abdominal pain.  Endocrine: Negative for polydipsia and polyphagia.  Neurological: Negative for weakness.  Psychiatric/Behavioral: Negative for confusion.   25 minutes was spent with the patient. Greater than half the time was spent in discussion and answering questions and counseling regarding the issues that the patient came in for today.     Objective:   Physical Exam  Constitutional: He appears well-nourished. No distress.  Cardiovascular: Normal rate, regular rhythm and normal heart sounds.   No murmur heard. Pulmonary/Chest:  Effort normal and breath sounds normal. No respiratory distress.  Musculoskeletal: He exhibits no edema.  Lymphadenopathy:    He has no cervical adenopathy.  Neurological: He is alert.  Psychiatric: His behavior is normal.  Vitals reviewed.         Assessment & Plan:  Diabetes very good control continue current measures I did discuss with him the importance of avoiding excessive sugars. We also talked about not taking too much insulin at night. 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

## 2017-04-27 ENCOUNTER — Other Ambulatory Visit: Payer: Self-pay | Admitting: Adult Health

## 2017-04-27 ENCOUNTER — Other Ambulatory Visit: Payer: Self-pay | Admitting: Family Medicine

## 2017-04-27 DIAGNOSIS — H401131 Primary open-angle glaucoma, bilateral, mild stage: Secondary | ICD-10-CM | POA: Diagnosis not present

## 2017-04-27 DIAGNOSIS — H11151 Pinguecula, right eye: Secondary | ICD-10-CM | POA: Diagnosis not present

## 2017-05-11 ENCOUNTER — Other Ambulatory Visit: Payer: Self-pay | Admitting: Family Medicine

## 2017-05-17 ENCOUNTER — Other Ambulatory Visit: Payer: Self-pay | Admitting: Family Medicine

## 2017-05-17 ENCOUNTER — Other Ambulatory Visit: Payer: Self-pay | Admitting: Cardiovascular Disease

## 2017-06-01 ENCOUNTER — Other Ambulatory Visit: Payer: Self-pay | Admitting: Family Medicine

## 2017-06-07 ENCOUNTER — Other Ambulatory Visit: Payer: Self-pay | Admitting: Family Medicine

## 2017-07-13 ENCOUNTER — Telehealth: Payer: Self-pay | Admitting: Family Medicine

## 2017-07-13 NOTE — Telephone Encounter (Signed)
Pt dropped off blood sugar log. In red folder in office.

## 2017-07-15 NOTE — Telephone Encounter (Signed)
Glucose readings were reviewed please go ahead and scan those into the system

## 2017-07-29 ENCOUNTER — Other Ambulatory Visit: Payer: Self-pay

## 2017-07-29 ENCOUNTER — Telehealth: Payer: Self-pay | Admitting: Cardiovascular Disease

## 2017-07-29 MED ORDER — METOPROLOL TARTRATE 50 MG PO TABS: ORAL_TABLET | ORAL | 0 refills | Status: DC

## 2017-07-29 NOTE — Telephone Encounter (Signed)
Calling in regards to his amLODipine (NORVASC) 10 MG tablet [[id number]] being recalled

## 2017-07-29 NOTE — Telephone Encounter (Signed)
cancelled lopressor 50 mg tab to [facility],entered in error

## 2017-07-29 NOTE — Telephone Encounter (Signed)
Returned pt call. He states that he was on the internet and he saw that amlodipine was recalled. I advised him to call his pharmacy and ask if it affected his pharmacy as they all use different manufacturers. He voiced understanding.

## 2017-07-30 ENCOUNTER — Encounter: Payer: Self-pay | Admitting: Family Medicine

## 2017-07-30 ENCOUNTER — Ambulatory Visit (INDEPENDENT_AMBULATORY_CARE_PROVIDER_SITE_OTHER): Payer: Medicare Other | Admitting: Family Medicine

## 2017-07-30 VITALS — BP 128/70 | Temp 98.4°F | Ht 72.0 in | Wt 216.0 lb

## 2017-07-30 DIAGNOSIS — R1084 Generalized abdominal pain: Secondary | ICD-10-CM | POA: Diagnosis not present

## 2017-07-30 DIAGNOSIS — R102 Pelvic and perineal pain: Secondary | ICD-10-CM | POA: Diagnosis not present

## 2017-07-30 DIAGNOSIS — R109 Unspecified abdominal pain: Secondary | ICD-10-CM

## 2017-07-30 LAB — POCT URINALYSIS DIPSTICK
SPEC GRAV UA: 1.01 (ref 1.010–1.025)
pH, UA: 5 (ref 5.0–8.0)

## 2017-07-30 NOTE — Progress Notes (Signed)
   Subjective:    Patient ID: Jerry Burns, male    DOB: February 13, 1934, 84 y.o.   MRN: [id number]  Abdominal Pain  This is a new problem. Episode onset: 2 weeks ago. The abdominal pain radiates to the RLQ. Pertinent negatives include no constipation, diarrhea or vomiting.   Results for orders placed or performed in visit on 07/30/17  POCT urinalysis dipstick  Result Value Ref Range   Color, UA     Clarity, UA     Glucose, UA     Bilirubin, UA     Ketones, UA     Spec Grav, UA 1.010 1.010 - 1.025   Blood, UA     pH, UA 5.0 5.0 - 8.0   Protein, UA     Urobilinogen, UA  0.2 or 1.0 E.U./dL   Nitrite, UA     Leukocytes, UA  Negative   Appearance     Odor     Gentleman presents today with a right lower abdominal pain and discomfort has been present over the past several weeks progressive in nature it started off as a mild ache but now it has become a severe ache.  He denies high fever chills sweats denies vomiting diarrhea.  But he states it does wake him up at night.  He denies any numbness or tingling or burning in his leg denies any injury does not hurt with any rotation movement or lifting denies hematuria or rectal bleeding   Review of Systems  Constitutional: Negative for activity change.  HENT: Negative for congestion and ear pain.   Respiratory: Negative for cough and choking.   Cardiovascular: Negative for chest pain.  Gastrointestinal: Positive for abdominal pain. Negative for blood in stool, constipation, diarrhea and vomiting.  Neurological: Negative for weakness.  Psychiatric/Behavioral: Negative for confusion.       Objective:   Physical Exam  Constitutional: He appears well-nourished. No distress.  Cardiovascular: Normal rate, regular rhythm and normal heart sounds.  No murmur heard. Pulmonary/Chest: Effort normal and breath sounds normal. No respiratory distress.  Musculoskeletal: He exhibits no edema.  Lymphadenopathy:    He has no cervical adenopathy.    Neurological: He is alert.  Psychiatric: His behavior is normal.  Vitals reviewed. On abdominal exam he does have tenderness in the right lower quadrant region there is no guarding or rebound I doubt that this is appendicitis given that this is come on over 2 weeks time but given the fact that it is getting worse it needs further looking into  PMH benign previous medical history reviewed      Assessment & Plan:  Abdominal pain This is new for the patient given the fact that is progressive the urine appears normal this patient does need lab work and he also needs a CAT scan  This patient lower abdominal tenderness is progressive waking him up at night and therefore it is in need of urgent CAT scan we will get this set up patient was told if he has severe abdominal pain nausea or vomiting high fever or bloody stools to go to the ER over the weekend

## 2017-07-31 DIAGNOSIS — R109 Unspecified abdominal pain: Secondary | ICD-10-CM | POA: Diagnosis not present

## 2017-07-31 DIAGNOSIS — R102 Pelvic and perineal pain: Secondary | ICD-10-CM | POA: Diagnosis not present

## 2017-08-01 LAB — CBC WITH DIFFERENTIAL/PLATELET
BASOS: 1 %
Basophils Absolute: 0 10*3/uL (ref 0.0–0.2)
EOS (ABSOLUTE): 0.4 10*3/uL (ref 0.0–0.4)
EOS: 6 %
HEMATOCRIT: 34.5 % — AB (ref 37.5–51.0)
HEMOGLOBIN: 11.7 g/dL — AB (ref 13.0–17.7)
Immature Grans (Abs): 0 10*3/uL (ref 0.0–0.1)
Immature Granulocytes: 0 %
LYMPHS ABS: 1.1 10*3/uL (ref 0.7–3.1)
Lymphs: 19 %
MCH: 29.8 pg (ref 26.6–33.0)
MCHC: 33.9 g/dL (ref 31.5–35.7)
MCV: 88 fL (ref 79–97)
Monocytes Absolute: 0.7 10*3/uL (ref 0.1–0.9)
Monocytes: 11 %
Neutrophils Absolute: 3.9 10*3/uL (ref 1.4–7.0)
Neutrophils: 63 %
Platelets: 256 10*3/uL (ref 150–379)
RBC: 3.93 x10E6/uL — AB (ref 4.14–5.80)
RDW: 13.3 % (ref 12.3–15.4)
WBC: 6 10*3/uL (ref 3.4–10.8)

## 2017-08-01 LAB — BASIC METABOLIC PANEL
BUN/Creatinine Ratio: 23 (ref 10–24)
BUN: 25 mg/dL (ref 8–27)
CALCIUM: 9.4 mg/dL (ref 8.6–10.2)
CHLORIDE: 101 mmol/L (ref 96–106)
CO2: 24 mmol/L (ref 20–29)
Creatinine, Ser: 1.07 mg/dL (ref 0.76–1.27)
GFR calc non Af Amer: 65 mL/min/{1.73_m2} (ref >59)
GFR, EST AFRICAN AMERICAN: 75 mL/min/{1.73_m2} (ref >59)
Glucose: 153 mg/dL — ABNORMAL HIGH (ref 65–99)
POTASSIUM: 5.5 mmol/L — AB (ref 3.5–5.2)
Sodium: 138 mmol/L (ref 134–144)

## 2017-08-01 LAB — HEPATIC FUNCTION PANEL
ALT: 14 IU/L (ref 0–44)
AST: 21 IU/L (ref 0–40)
Albumin: 4.3 g/dL (ref 3.5–4.7)
Alkaline Phosphatase: 81 IU/L (ref 39–117)
Bilirubin Total: 0.4 mg/dL (ref 0.0–1.2)
Bilirubin, Direct: 0.09 mg/dL (ref 0.00–0.40)
Total Protein: 7.1 g/dL (ref 6.0–8.5)

## 2017-08-01 LAB — LIPASE: Lipase: 28 U/L (ref 13–78)

## 2017-08-02 ENCOUNTER — Telehealth: Payer: Self-pay | Admitting: Family Medicine

## 2017-08-02 DIAGNOSIS — H401131 Primary open-angle glaucoma, bilateral, mild stage: Secondary | ICD-10-CM | POA: Diagnosis not present

## 2017-08-02 NOTE — Telephone Encounter (Signed)
I'm still concerned, it is possible this was a kidney stone but I still want him to do the test-Thanks

## 2017-08-02 NOTE — Telephone Encounter (Signed)
Requesting a nurse to call him back regarding an upcoming appointment he has.

## 2017-08-02 NOTE — Telephone Encounter (Signed)
Pt's ct scan scheduled for tomorrow. He states he was having a really bad pain on Saturday and then when he woke up Sunday he has not had any pain at all. Pain is completely gone. Pt wants to know your thoughts on canceling the ct scan

## 2017-08-02 NOTE — Telephone Encounter (Signed)
Patient is aware 

## 2017-08-03 ENCOUNTER — Ambulatory Visit (HOSPITAL_COMMUNITY)
Admission: RE | Admit: 2017-08-03 | Discharge: 2017-08-03 | Disposition: A | Discharge status: Home / Self Care | Payer: Medicare Other | Source: Ambulatory Visit | Attending: Family Medicine | Admitting: Family Medicine

## 2017-08-03 ENCOUNTER — Ambulatory Visit (HOSPITAL_COMMUNITY): Payer: Medicare Other

## 2017-08-03 DIAGNOSIS — J90 Pleural effusion, not elsewhere classified: Secondary | ICD-10-CM | POA: Diagnosis not present

## 2017-08-03 DIAGNOSIS — R102 Pelvic and perineal pain: Secondary | ICD-10-CM | POA: Insufficient documentation

## 2017-08-03 DIAGNOSIS — R1084 Generalized abdominal pain: Secondary | ICD-10-CM | POA: Insufficient documentation

## 2017-08-03 DIAGNOSIS — R1031 Right lower quadrant pain: Secondary | ICD-10-CM | POA: Diagnosis not present

## 2017-08-03 MED ORDER — IOPAMIDOL (ISOVUE-300) INJECTION 61%
100.0000 mL | Freq: Once | INTRAVENOUS | Status: AC | PRN
  Administered 2017-08-03: 100 mL via INTRAVENOUS

## 2017-08-19 ENCOUNTER — Telehealth: Payer: Self-pay | Admitting: Family Medicine

## 2017-08-19 DIAGNOSIS — E785 Hyperlipidemia, unspecified: Secondary | ICD-10-CM

## 2017-08-19 DIAGNOSIS — E1140 Type 2 diabetes mellitus with diabetic neuropathy, unspecified: Secondary | ICD-10-CM

## 2017-08-19 DIAGNOSIS — Z794 Long term (current) use of insulin: Secondary | ICD-10-CM

## 2017-08-19 NOTE — Telephone Encounter (Signed)
A1c, lipid, urine ACR

## 2017-08-19 NOTE — Telephone Encounter (Signed)
Patient has an appointment on 08/30/17 with Dr. Nicki Reaper. He is requesting orders for labs.

## 2017-08-19 NOTE — Telephone Encounter (Signed)
Orders put in. Pt notified.  

## 2017-08-19 NOTE — Telephone Encounter (Signed)
Patient just had labs drawn on 07/31/2017 Lipase,Bmet,Hepatic fx panel,cbc.Does he need to have drawn again? Please advise.

## 2017-08-23 DIAGNOSIS — E785 Hyperlipidemia, unspecified: Secondary | ICD-10-CM | POA: Diagnosis not present

## 2017-08-23 DIAGNOSIS — Z794 Long term (current) use of insulin: Secondary | ICD-10-CM | POA: Diagnosis not present

## 2017-08-23 DIAGNOSIS — E1140 Type 2 diabetes mellitus with diabetic neuropathy, unspecified: Secondary | ICD-10-CM | POA: Diagnosis not present

## 2017-08-24 LAB — LIPID PANEL
CHOLESTEROL TOTAL: 155 mg/dL (ref 100–199)
Chol/HDL Ratio: 2.7 ratio (ref 0.0–5.0)
HDL: 57 mg/dL (ref >39)
LDL Calculated: 78 mg/dL (ref 0–99)
Triglycerides: 102 mg/dL (ref 0–149)
VLDL Cholesterol Cal: 20 mg/dL (ref 5–40)

## 2017-08-24 LAB — HEMOGLOBIN A1C
ESTIMATED AVERAGE GLUCOSE: 163 mg/dL
HEMOGLOBIN A1C: 7.3 % — AB (ref 4.8–5.6)

## 2017-08-24 LAB — MICROALBUMIN / CREATININE URINE RATIO
CREATININE, UR: 20.3 mg/dL
MICROALB/CREAT RATIO: 491.6 mg/g{creat} — AB (ref 0.0–30.0)
MICROALBUM., U, RANDOM: 99.8 ug/mL

## 2017-08-30 ENCOUNTER — Ambulatory Visit (INDEPENDENT_AMBULATORY_CARE_PROVIDER_SITE_OTHER): Payer: Medicare Other | Admitting: Family Medicine

## 2017-08-30 ENCOUNTER — Encounter: Payer: Self-pay | Admitting: Family Medicine

## 2017-08-30 ENCOUNTER — Other Ambulatory Visit: Payer: Self-pay | Admitting: Family Medicine

## 2017-08-30 VITALS — BP 118/70 | Ht 72.0 in | Wt 218.0 lb

## 2017-08-30 DIAGNOSIS — I10 Essential (primary) hypertension: Secondary | ICD-10-CM

## 2017-08-30 DIAGNOSIS — E1140 Type 2 diabetes mellitus with diabetic neuropathy, unspecified: Secondary | ICD-10-CM

## 2017-08-30 DIAGNOSIS — R42 Dizziness and giddiness: Secondary | ICD-10-CM | POA: Diagnosis not present

## 2017-08-30 DIAGNOSIS — S60511A Abrasion of right hand, initial encounter: Secondary | ICD-10-CM | POA: Diagnosis not present

## 2017-08-30 DIAGNOSIS — E782 Mixed hyperlipidemia: Secondary | ICD-10-CM

## 2017-08-30 DIAGNOSIS — M25561 Pain in right knee: Secondary | ICD-10-CM | POA: Diagnosis not present

## 2017-08-30 DIAGNOSIS — Z794 Long term (current) use of insulin: Secondary | ICD-10-CM

## 2017-08-30 MED ORDER — ZOSTER VAC RECOMB ADJUVANTED 50 MCG/0.5ML IM SUSR: 0.5000 mL | Freq: Once | INTRAMUSCULAR | 0 refills | Status: DC

## 2017-08-30 MED ORDER — SITAGLIPTIN PHOSPHATE 100 MG PO TABS: 100.0000 mg | ORAL_TABLET | Freq: Every day | ORAL | 1 refills | Status: DC

## 2017-08-30 NOTE — Progress Notes (Signed)
Subjective:    Patient ID: Jerry Burns, male    DOB: 21-Jan-1934, 84 y.o.   MRN: [id number]  Diabetes  He presents for his follow-up diabetic visit. He has type 2 diabetes mellitus. Pertinent negatives for hypoglycemia include no confusion or headaches. Pertinent negatives for diabetes include no chest pain, no fatigue, no polydipsia, no polyphagia and no weakness. He is compliant with treatment all of the time. Exercise: does a lot of exercise. Home blood sugar record trend: pt brought in blood sugar readings.  having trouble with keeping sugar under control.  A1C done on bloodwork.   Dizziness. For 2 -3 months.  The dizziness occurs when he first stands up the last for a few moments then get better sometimes occurs more often near bedtime or during the middle of the night has not had any falls or injury  Right knee pain for 2 -3 years.  Symptoms of knee pressure pain discomfort does not lock or give way-wonders if he should be on any medications or have injections.  Would like to see Ortho.  Would like to see Dr. Sherolyn Buba in [place].    Patient for blood pressure check up. Patient relates compliance with meds. Todays BP reviewed with the patient. Patient denies issues with medication. Patient relates reasonable diet. Patient tries to minimize salt. Patient aware of BP goals.  Patient here for follow-up regarding cholesterol.  Patient does try to maintain a reasonable diet.  Patient does take the medication on a regular basis.  Denies missing a dose.  The patient denies any obvious side effects.  Prior blood work results reviewed with the patient.  The patient is aware of his cholesterol goals and the need to keep it under good control to lessen the risk of disease.    Review of Systems  Constitutional: Negative for activity change, appetite change and fatigue.  HENT: Negative for congestion and rhinorrhea.   Respiratory: Negative for cough, chest tightness and shortness of breath.     Cardiovascular: Negative for chest pain and leg swelling.  Gastrointestinal: Negative for abdominal pain, diarrhea and nausea.  Endocrine: Negative for polydipsia and polyphagia.  Genitourinary: Negative for dysuria and hematuria.  Musculoskeletal: Positive for arthralgias.  Neurological: Negative for weakness and headaches.  Psychiatric/Behavioral: Negative for confusion and dysphoric mood.       Objective:   Physical Exam  Constitutional: He appears well-nourished. No distress.  HENT:  Head: Normocephalic and atraumatic.  Eyes: Right eye exhibits no discharge. Left eye exhibits no discharge.  Neck: No tracheal deviation present.  Cardiovascular: Normal rate, regular rhythm and normal heart sounds.  No murmur heard. Pulmonary/Chest: Effort normal and breath sounds normal. No respiratory distress. He has no wheezes.  Musculoskeletal: He exhibits no edema.  Lymphadenopathy:    He has no cervical adenopathy.  Neurological: He is alert.  Skin: Skin is warm. No rash noted.  Psychiatric: His behavior is normal.  Vitals reviewed.    25 minutes was spent with the patient.  This statement verifies that 25 minutes was indeed spent with the patient. Greater than half the time was spent in discussion, counseling and answering questions  regarding the issues that the patient came in for today as reflected in the diagnosis (s) please refer to documentation for further details.      Assessment & Plan:  1. Type 2 diabetes mellitus with diabetic neuropathy, with long-term current use of insulin (Jerry Burns) Subpar control this puts him at risk of strokes heart attacks we  talked about adjusting his medication increase Januvia to 100 mg watch diet closely exercise closely as well as monitor sugar readings and send [place] readings in - POCT glycosylated hemoglobin (Hb A1C)  2. Essential hypertension Blood pressure under good control needs to take his medicine on a regular basis he is aware that he has  proteinuria related to the diabetes needs to restrict his protein intake - Basic metabolic panel  3. Mixed hyperlipidemia Significant cholesterol issues very important patient to take his medicine to reduce the risk of strokes watch diet as well  4. Orthostatic dizziness Mild orthostatic dizziness but blood pressure under decent control stick with current dosing but at the same time be very careful standing up or moving  5. Right knee pain, unspecified chronicity Significant arthritic changes noted patient not a good candidate for anti-inflammatory on a regular basis because of increased risk of GI bleed referral to orthopedic surgery possible injections - Ambulatory referral to Orthopedic Surgery  6. Abrasion of right hand, initial encounter Abrasion on his hand tetanus shot - Td vaccine greater than or equal to 7yo preservative free IM

## 2017-10-01 ENCOUNTER — Telehealth: Payer: Self-pay | Admitting: Family Medicine

## 2017-10-01 ENCOUNTER — Other Ambulatory Visit: Payer: Self-pay | Admitting: Family Medicine

## 2017-10-01 DIAGNOSIS — M25569 Pain in unspecified knee: Secondary | ICD-10-CM

## 2017-10-01 NOTE — Telephone Encounter (Signed)
Please let the patient know that I did receive his letter.  I did read it.  Let him know the following #1 his sugars look under good control.  Keep everything as is keep all regular follow-up visits.  #2 we can easily refer him to Dr. Arther Abbott for his orthopedic concerns.  The reason for the referral is knee pain.  Please go ahead and initiate referral.  #3 let the patient know that it is okay for him to take the supplement he spoke of to see if it helps him with his sleep.  We will see him at his follow-up visit.

## 2017-10-01 NOTE — Telephone Encounter (Signed)
Spoke with patient; put referral in. Patient verbalized gratitude and understanding.

## 2017-10-04 ENCOUNTER — Encounter: Payer: Self-pay | Admitting: Family Medicine

## 2017-10-11 ENCOUNTER — Other Ambulatory Visit: Payer: Self-pay | Admitting: Family Medicine

## 2017-10-27 ENCOUNTER — Ambulatory Visit (INDEPENDENT_AMBULATORY_CARE_PROVIDER_SITE_OTHER): Payer: Medicare Other | Admitting: Orthopedic Surgery

## 2017-10-27 ENCOUNTER — Encounter: Payer: Self-pay | Admitting: Orthopedic Surgery

## 2017-10-27 ENCOUNTER — Ambulatory Visit (INDEPENDENT_AMBULATORY_CARE_PROVIDER_SITE_OTHER): Payer: Medicare Other

## 2017-10-27 VITALS — BP 163/73 | HR 65 | Ht 72.0 in | Wt 216.0 lb

## 2017-10-27 DIAGNOSIS — M1711 Unilateral primary osteoarthritis, right knee: Secondary | ICD-10-CM | POA: Diagnosis not present

## 2017-10-27 DIAGNOSIS — M25561 Pain in right knee: Secondary | ICD-10-CM

## 2017-10-27 DIAGNOSIS — G8929 Other chronic pain: Secondary | ICD-10-CM

## 2017-10-27 NOTE — Progress Notes (Signed)
NEW PATIENT OFFICE VISIT   Chief Complaint  Patient presents with  . Knee Pain    right     84 year old male presents for evaluation of 75-month history of right knee pain.  He was treated in the past with medication about 5 years ago did well but lately has noticed increased dull aching pain medial joint line again present for 5 months associated with pain.  They are climbing.  Mild discomfort when walking as well.   Review of Systems  Constitutional: Negative for fever.  Respiratory: Negative.   Cardiovascular: Negative.   Skin: Negative for rash.     Past Medical History:  Diagnosis Date  . Anginal pain ([place])   . Arthritis    Bilateral knee  . Coronary artery disease   . Diabetes mellitus    insulin dependent   . Hyperlipidemia   . Hypertension   . Progressive angina ([place]) 03/2015   3.0x12 mm Resolute DES to pLAD, Angiosculpt scoring balloon to dLAD  . Stroke [facility])     Past Surgical History:  Procedure Laterality Date  . APPENDECTOMY    . CARDIAC CATHETERIZATION N/A 04/12/2015   Procedure: Left Heart Cath and Coronary Angiography;  Surgeon: Sherren Mocha, MD; pLAD 75>0% w/ 3.0x12 mm Resolute DES, dLAD 80>0% w/ Angiosculpt scoring balloon, CFX luminal irreg, RCA mild dz, EF 55-65%   . COLONOSCOPY  2011   RMR: 1. Normal rectum 2. Left sided diverticula , 2 ascending colon polyps, status post snare polypectomy. Remainder of colonic mucosa appeared unremarkable.   . COLONOSCOPY N/A 05/13/2016   Procedure: COLONOSCOPY;  Surgeon: Daneil Dolin, MD;  Location: [facility];  Service: Endoscopy;  Laterality: N/A;  7:30 AM  . CORONARY ANGIOGRAPHY N/A 02/04/2017   Procedure: CORONARY ANGIOGRAPHY (CATH LAB);  Surgeon: Troy Sine, MD;  Location: [facility] CV LAB;  Service: Cardiovascular;  Laterality: N/A;  . CORONARY ANGIOPLASTY WITH STENT PLACEMENT  08/22/2014   PTCA/DES X mLAD with 2.5 x 16 mm Promus Premier DES by Dr Julianne Handler  . FOOT SURGERY Right   . LEFT HEART  CATHETERIZATION WITH CORONARY ANGIOGRAM N/A 08/22/2014   Procedure: LEFT HEART CATHETERIZATION WITH CORONARY ANGIOGRAM;  Surgeon: Burnell Blanks, MD;  Location: [facility] CATH LAB;  Service: Cardiovascular;  Laterality: N/A;  . PERCUTANEOUS CORONARY STENT INTERVENTION (PCI-S)  08/22/2014   Procedure: PERCUTANEOUS CORONARY STENT INTERVENTION (PCI-S);  Surgeon: Burnell Blanks, MD;   . SHOULDER SURGERY    . TONSILLECTOMY      Family History  Problem Relation Age of Onset  . Diabetes Unknown   . Diabetes Brother   . Colon cancer Maternal Uncle   . Cancer Mother   . Diabetes Father   . Hypertension Sister    Social History   Tobacco Use  . Smoking status: Former Smoker    Packs/day: 1.00    Years: 20.00    Pack years: 20.00    Types: Cigarettes, Cigars    Start date: 08/10/1955    Last attempt to quit: 07/28/2007    Years since quitting: 10.2  . Smokeless tobacco: Never Used  . Tobacco comment: Quit x 15 years  Substance Use Topics  . Alcohol use: Yes    Alcohol/week: 0.0 oz    Comment: Daily about 1 glass of wine  . Drug use: No    @ALL @  Current Meds  Medication Sig  . amLODipine (NORVASC) 10 MG tablet TAKE 1 TABLET BY MOUTH ONCE A DAY.  Marland Kitchen aspirin EC  81 MG tablet Take 81 mg by mouth daily.  Marland Kitchen atorvastatin (LIPITOR) 80 MG tablet TAKE (1) TABLET BY MOUTH ONCE DAILY.  . carvedilol (COREG) 6.25 MG tablet TAKE (1) TABLET BY MOUTH TWICE DAILY.  Marland Kitchen COMBIGAN 0.2-0.5 % ophthalmic solution Place 1 drop into the left eye 2 (two) times daily.   Marland Kitchen dutasteride (AVODART) 0.5 MG capsule TAKE ONE CAPSULE BY MOUTH ONCE DAILY.  Marland Kitchen Emollient (CERAVE) CREA Apply 1 application topically daily as needed (dry skin).  Marland Kitchen erythromycin ophthalmic ointment Place 1 application into the left eye 3 (three) times daily.  . Flaxseed, Linseed, (FLAX SEED OIL) 1000 MG CAPS Take 1,000 mg by mouth daily.  Marland Kitchen glucose blood (CONTOUR NEXT TEST) test strip USE ONE STRIP TO CHECK GLUCOSE TWICE DAILY. DX E11.9   . LANTUS SOLOSTAR 100 UNIT/ML Solostar Pen INJECT 22 UNITS SUBCUTANEOUSLY ONCE DAILY AT  10  PM  . metFORMIN (GLUCOPHAGE) 1000 MG tablet TAKE (1) TABLET BY MOUTH TWICE A DAY WITH A MEAL.  . Multiple Vitamin (MULTIVITAMIN) tablet Take 1 tablet by mouth daily.  . nitroGLYCERIN (NITROSTAT) 0.4 MG SL tablet PLACE 1 TAB UNDER TONGUE EVERY 5 MIN IF NEEDED FOR CHEST PAIN. MAY USE 3 TIMES.NO RELIEF CALL 911.  . Polyvinyl Alcohol-Povidone (REFRESH OP) Place 1 drop into the left eye 2 (two) times daily.  Marland Kitchen PROAIR HFA 108 (90 Base) MCG/ACT inhaler INHALE 2 PUFFS INTO THE LUNGS EVERY 6 HOURS AS NEEDED FOR WHEEZING ORSHORTNESS OF BREATH.  Marland Kitchen psyllium (METAMUCIL) 58.6 % powder Take 1 packet by mouth daily.   . quinapril (ACCUPRIL) 40 MG tablet TAKE (1) TABLET BY MOUTH AT BEDTIME.  . sitaGLIPtin (JANUVIA) 100 MG tablet Take 1 tablet (100 mg total) by mouth daily.    BP (!) 163/73   Pulse 65   Ht 6' (1.829 m)   Wt 216 lb (98 kg)   BMI 29.29 kg/m   Physical Exam  Constitutional: He is oriented to person, place, and time. He appears well-developed and well-nourished.  Vital signs have been reviewed and are stable. Gen. appearance the patient is well-developed and well-nourished with normal grooming and hygiene.   Neurological: He is alert and oriented to person, place, and time.  Skin: Skin is warm and dry. No erythema.  Psychiatric: He has a normal mood and affect.  Vitals reviewed.   Ortho Exam Right lower extremity  Limb alignment is normal tenderness medial joint line no effusion knee flexion arc 125 degrees knee stable in all planes muscle tone and strength normal skin intact no rash pulse perfusion normal distally no edema normal sensation right leg  Left leg knee alignment normal range of motion is good stability tests were normal strength was intact with grade 5 extension power   MEDICAL DECISION SECTION  xrays ordered?  Yes  My independent reading of xrays:  Radiology report right knee  3 views right knee pain  Medial compartment joint space narrowing no sclerosis is noted and there are no osteophytes limb alignment 2-3 degrees of tibiofemoral valgus  Patellofemoral joint normal  Impression osteoarthritis medial compartment right knee moderate  Encounter Diagnoses  Name Primary?  . Chronic pain of right knee Yes  . Primary osteoarthritis of right knee      PLAN:    Injection? Yes  Procedure note right knee injection verbal consent was obtained to inject right knee joint  Timeout was completed to confirm the site of injection  The medications used were 40 mg of Depo-Medrol and  1% lidocaine 3 cc  Anesthesia was provided by ethyl chloride and the skin was prepped with alcohol.  After cleaning the skin with alcohol a 20-gauge needle was used to inject the right knee joint. There were no complications. A sterile bandage was applied. MRI/CT/? no

## 2017-10-27 NOTE — Patient Instructions (Signed)

## 2017-11-04 ENCOUNTER — Telehealth: Payer: Self-pay | Admitting: Radiology

## 2017-11-04 NOTE — Telephone Encounter (Signed)
I spoke to patient he had knee injection last week, and is feeling a little better, wants to know about activity level, and exercises.  I told him I will have you mark exercises he can do on your exercise sheet and I will call him to pick up when it is ready.   I put the sheet by your desk, will you mark the ones you want him to do?  I encouraged him to continue walking for exercise, on level surface no treadmill.

## 2017-11-08 NOTE — Telephone Encounter (Signed)
Patient awaiting further advice regarding knee, as noted; said not improving since cortisone injection.  Asked if Amy will please call when available.

## 2017-11-08 NOTE — Telephone Encounter (Signed)
Do not schedule let me cal lhim

## 2017-11-27 ENCOUNTER — Other Ambulatory Visit: Payer: Self-pay | Admitting: Family Medicine

## 2017-12-22 ENCOUNTER — Other Ambulatory Visit: Payer: Self-pay | Admitting: Family Medicine

## 2017-12-22 DIAGNOSIS — I10 Essential (primary) hypertension: Secondary | ICD-10-CM | POA: Diagnosis not present

## 2017-12-22 DIAGNOSIS — E875 Hyperkalemia: Secondary | ICD-10-CM

## 2017-12-22 DIAGNOSIS — E1140 Type 2 diabetes mellitus with diabetic neuropathy, unspecified: Secondary | ICD-10-CM

## 2017-12-22 DIAGNOSIS — Z794 Long term (current) use of insulin: Principal | ICD-10-CM

## 2017-12-23 LAB — BASIC METABOLIC PANEL
BUN/Creatinine Ratio: 20 (ref 10–24)
BUN: 20 mg/dL (ref 8–27)
CO2: 24 mmol/L (ref 20–29)
CREATININE: 1.01 mg/dL (ref 0.76–1.27)
Calcium: 9.6 mg/dL (ref 8.6–10.2)
Chloride: 98 mmol/L (ref 96–106)
GFR calc Af Amer: 80 mL/min/{1.73_m2} (ref >59)
GFR calc non Af Amer: 69 mL/min/{1.73_m2} (ref >59)
Glucose: 158 mg/dL — ABNORMAL HIGH (ref 65–99)
POTASSIUM: 5.8 mmol/L — AB (ref 3.5–5.2)
SODIUM: 137 mmol/L (ref 134–144)

## 2017-12-29 ENCOUNTER — Other Ambulatory Visit: Payer: Self-pay | Admitting: Cardiology

## 2017-12-30 MED ORDER — QUINAPRIL HCL 20 MG PO TABS: 20.0000 mg | ORAL_TABLET | Freq: Every day | ORAL | 3 refills | Status: DC

## 2017-12-30 NOTE — Addendum Note (Signed)
Addended by: Dairl Ponder on: 12/30/2017 04:24 PM   Modules accepted: Orders

## 2018-01-08 DIAGNOSIS — E875 Hyperkalemia: Secondary | ICD-10-CM | POA: Diagnosis not present

## 2018-01-08 DIAGNOSIS — E1140 Type 2 diabetes mellitus with diabetic neuropathy, unspecified: Secondary | ICD-10-CM | POA: Diagnosis not present

## 2018-01-08 DIAGNOSIS — Z794 Long term (current) use of insulin: Secondary | ICD-10-CM | POA: Diagnosis not present

## 2018-01-09 LAB — BASIC METABOLIC PANEL
BUN/Creatinine Ratio: 20 (ref 10–24)
BUN: 20 mg/dL (ref 8–27)
CALCIUM: 9.8 mg/dL (ref 8.6–10.2)
CO2: 26 mmol/L (ref 20–29)
CREATININE: 1 mg/dL (ref 0.76–1.27)
Chloride: 99 mmol/L (ref 96–106)
GFR calc Af Amer: 81 mL/min/{1.73_m2} (ref >59)
GFR, EST NON AFRICAN AMERICAN: 70 mL/min/{1.73_m2} (ref >59)
Glucose: 115 mg/dL — ABNORMAL HIGH (ref 65–99)
POTASSIUM: 5.2 mmol/L (ref 3.5–5.2)
Sodium: 137 mmol/L (ref 134–144)

## 2018-01-09 LAB — HEMOGLOBIN A1C
ESTIMATED AVERAGE GLUCOSE: 157 mg/dL
Hgb A1c MFr Bld: 7.1 % — ABNORMAL HIGH (ref 4.8–5.6)

## 2018-01-10 ENCOUNTER — Other Ambulatory Visit: Payer: Self-pay | Admitting: Family Medicine

## 2018-01-11 ENCOUNTER — Ambulatory Visit (INDEPENDENT_AMBULATORY_CARE_PROVIDER_SITE_OTHER): Payer: Medicare Other | Admitting: Family Medicine

## 2018-01-11 ENCOUNTER — Encounter: Payer: Self-pay | Admitting: Family Medicine

## 2018-01-11 VITALS — BP 112/66 | Ht 72.0 in | Wt 212.0 lb

## 2018-01-11 DIAGNOSIS — B351 Tinea unguium: Secondary | ICD-10-CM | POA: Diagnosis not present

## 2018-01-11 DIAGNOSIS — I10 Essential (primary) hypertension: Secondary | ICD-10-CM | POA: Diagnosis not present

## 2018-01-11 DIAGNOSIS — E1140 Type 2 diabetes mellitus with diabetic neuropathy, unspecified: Secondary | ICD-10-CM | POA: Diagnosis not present

## 2018-01-11 DIAGNOSIS — E782 Mixed hyperlipidemia: Secondary | ICD-10-CM

## 2018-01-11 DIAGNOSIS — R253 Fasciculation: Secondary | ICD-10-CM

## 2018-01-11 DIAGNOSIS — Z794 Long term (current) use of insulin: Secondary | ICD-10-CM

## 2018-01-11 MED ORDER — METFORMIN HCL 1000 MG PO TABS: ORAL_TABLET | ORAL | 1 refills | Status: DC

## 2018-01-11 MED ORDER — AMLODIPINE BESYLATE 10 MG PO TABS: ORAL_TABLET | ORAL | 1 refills | Status: DC

## 2018-01-11 MED ORDER — INSULIN GLARGINE 100 UNIT/ML SOLOSTAR PEN: PEN_INJECTOR | SUBCUTANEOUS | 5 refills | Status: DC

## 2018-01-11 MED ORDER — CARVEDILOL 6.25 MG PO TABS: ORAL_TABLET | ORAL | 1 refills | Status: DC

## 2018-01-11 NOTE — Patient Instructions (Signed)
Results for orders placed or performed in visit on 03/70/48  Basic metabolic panel  Result Value Ref Range   Glucose 158 (H) 65 - 99 mg/dL   BUN 20 8 - 27 mg/dL   Creatinine, Ser 1.01 0.76 - 1.27 mg/dL   GFR calc non Af Amer 69 >59 mL/min/1.73   GFR calc Af Amer 80 >59 mL/min/1.73   BUN/Creatinine Ratio 20 10 - 24   Sodium 137 134 - 144 mmol/L   Potassium 5.8 (H) 3.5 - 5.2 mmol/L   Chloride 98 96 - 106 mmol/L   CO2 24 20 - 29 mmol/L   Calcium 9.6 8.6 - 10.2 mg/dL  Basic metabolic panel  Result Value Ref Range   Glucose 115 (H) 65 - 99 mg/dL   BUN 20 8 - 27 mg/dL   Creatinine, Ser 1.00 0.76 - 1.27 mg/dL   GFR calc non Af Amer 70 >59 mL/min/1.73   GFR calc Af Amer 81 >59 mL/min/1.73   BUN/Creatinine Ratio 20 10 - 24   Sodium 137 134 - 144 mmol/L   Potassium 5.2 3.5 - 5.2 mmol/L   Chloride 99 96 - 106 mmol/L   CO2 26 20 - 29 mmol/L   Calcium 9.8 8.6 - 10.2 mg/dL  Hemoglobin A1c  Result Value Ref Range   Hgb A1c MFr Bld 7.1 (H) 4.8 - 5.6 %   Est. average glucose Bld gHb Est-mCnc 157 mg/dL

## 2018-01-11 NOTE — Progress Notes (Signed)
Subjective:    Patient ID: Jerry Burns, male    DOB: [birth year], 84 y.o.   MRN: [id number]  Diabetes  He presents for his follow-up diabetic visit. He has type 2 diabetes mellitus. Pertinent negatives for hypoglycemia include no confusion or headaches. Pertinent negatives for diabetes include no chest pain, no fatigue, no polydipsia, no polyphagia and no weakness. He is compliant with treatment all of the time. He does not see a podiatrist.Eye exam is current.   Fungus on toenails.  The patient relates that his toenails are thickening especially on the great toes he trims and back he is using Vicks VapoRub it seems to help some he describes some slight discomfort but denies any high fever chills sweats denies any infection in his feet try to get into podiatry but they were full  Involuntary movement.  Patient has an occasional itch that occurs on his hand causing his finger to twitch he denies any tremor denies any difficulty feeding or eating  Patient for blood pressure check up. Patient relates compliance with meds. Todays BP reviewed with the patient. Patient denies issues with medication. Patient relates reasonable diet. Patient tries to minimize salt. Patient aware of BP goals.  Patient here for follow-up regarding cholesterol.  Patient does try to maintain a reasonable diet.  Patient does take the medication on a regular basis.  Denies missing a dose.  The patient denies any obvious side effects.  Prior blood work results reviewed with the patient.  The patient is aware of his cholesterol goals and the need to keep it under good control to lessen the risk of disease.   Results for orders placed or performed in visit on 88/28/00  Basic metabolic panel  Result Value Ref Range   Glucose 158 (H) 65 - 99 mg/dL   BUN 20 8 - 27 mg/dL   Creatinine, Ser 1.01 0.76 - 1.27 mg/dL   GFR calc non Af Amer 69 >59 mL/min/1.73   GFR calc Af Amer 80 >59 mL/min/1.73   BUN/Creatinine Ratio 20 10 - 24     Sodium 137 134 - 144 mmol/L   Potassium 5.8 (H) 3.5 - 5.2 mmol/L   Chloride 98 96 - 106 mmol/L   CO2 24 20 - 29 mmol/L   Calcium 9.6 8.6 - 10.2 mg/dL  Basic metabolic panel  Result Value Ref Range   Glucose 115 (H) 65 - 99 mg/dL   BUN 20 8 - 27 mg/dL   Creatinine, Ser 1.00 0.76 - 1.27 mg/dL   GFR calc non Af Amer 70 >59 mL/min/1.73   GFR calc Af Amer 81 >59 mL/min/1.73   BUN/Creatinine Ratio 20 10 - 24   Sodium 137 134 - 144 mmol/L   Potassium 5.2 3.5 - 5.2 mmol/L   Chloride 99 96 - 106 mmol/L   CO2 26 20 - 29 mmol/L   Calcium 9.8 8.6 - 10.2 mg/dL  Hemoglobin A1c  Result Value Ref Range   Hgb A1c MFr Bld 7.1 (H) 4.8 - 5.6 %   Est. average glucose Bld gHb Est-mCnc 157 mg/dL       Review of Systems  Constitutional: Negative for activity change, appetite change and fatigue.  HENT: Negative for congestion and rhinorrhea.   Respiratory: Negative for cough, chest tightness and shortness of breath.   Cardiovascular: Negative for chest pain and leg swelling.  Gastrointestinal: Negative for abdominal pain, diarrhea and nausea.  Endocrine: Negative for polydipsia and polyphagia.  Genitourinary: Negative for dysuria and hematuria.  Neurological: Negative for weakness and headaches.  Psychiatric/Behavioral: Negative for confusion and dysphoric mood.       Objective:   Physical Exam  Constitutional: He appears well-nourished. No distress.  Cardiovascular: Normal rate, regular rhythm and normal heart sounds.  No murmur heard. Pulmonary/Chest: Effort normal and breath sounds normal. No respiratory distress.  Musculoskeletal: He exhibits no edema.  Lymphadenopathy:    He has no cervical adenopathy.  Neurological: He is alert.  Psychiatric: His behavior is normal.  Vitals reviewed.   Patient was having hyperkalemia we adjusted his blood pressure medicine and his potassium is now normal I find no evidence of Parkinson's find no evidence of tremor patient's walking is normal no  cogwheeling     Assessment & Plan:  Involuntary movements-I find no evidence of Parkinson's I do not feel that it is necessary to do any type of neurologic work-up currently  If this area gets worse he will notify us  Diabetes under good control continue current measures watch diet closely avoid low sugars  Hyperlipidemia doing well with medication tolerating well watch diet continue ongoing activity  Hypertension good control continue current measures  Toenail fungus patient does not feel as bad enough to warrant treatment  25 minutes was spent with the patient.  This statement verifies that 25 minutes was indeed spent with the patient. Greater than half the time was spent in discussion, counseling and answering questions  regarding the issues that the patient came in for today as reflected in the diagnosis (s) please refer to documentation for further details. Greater than half time spent discussing hyperlipidemia hypertension treatment of nail fungus and treatment of diabetes and evaluation of muscle twitch

## 2018-01-18 ENCOUNTER — Telehealth: Payer: Self-pay | Admitting: Family Medicine

## 2018-01-18 NOTE — Telephone Encounter (Signed)
Recently [facility] sent notification-based on patient having relatively good control of diabetes they will cover 3 strips per day please resend them order for diabetic strips 1 3 times daily as needed as directed by physician #[id number] and 12 refills

## 2018-01-19 MED ORDER — GLUCOSE BLOOD VI STRP: ORAL_STRIP | 11 refills | Status: DC

## 2018-01-19 NOTE — Telephone Encounter (Signed)
Prescription sent electronically to pharmacy. 

## 2018-01-19 NOTE — Addendum Note (Signed)
Addended by: Dairl Ponder on: 01/19/2018 08:45 AM   Modules accepted: Orders

## 2018-02-17 DIAGNOSIS — H40003 Preglaucoma, unspecified, bilateral: Secondary | ICD-10-CM | POA: Diagnosis not present

## 2018-02-17 DIAGNOSIS — E119 Type 2 diabetes mellitus without complications: Secondary | ICD-10-CM | POA: Diagnosis not present

## 2018-02-19 ENCOUNTER — Other Ambulatory Visit: Payer: Self-pay | Admitting: Family Medicine

## 2018-02-23 ENCOUNTER — Other Ambulatory Visit: Payer: Self-pay

## 2018-02-23 DIAGNOSIS — M25562 Pain in left knee: Secondary | ICD-10-CM | POA: Diagnosis not present

## 2018-02-23 DIAGNOSIS — M25561 Pain in right knee: Secondary | ICD-10-CM | POA: Diagnosis not present

## 2018-02-23 DIAGNOSIS — M545 Low back pain: Secondary | ICD-10-CM | POA: Diagnosis not present

## 2018-02-23 MED ORDER — GLUCOSE BLOOD VI STRP: ORAL_STRIP | 11 refills | Status: DC

## 2018-03-09 ENCOUNTER — Telehealth: Payer: Self-pay | Admitting: Family Medicine

## 2018-03-09 NOTE — Telephone Encounter (Signed)
It would be reasonable for the patient to do a metabolic 7, C-reactive protein, sedimentation rate, CBC, TSH-diagnosis myalgias, other fatigue, anemia, high risk medications, arthralgias  Patient may do a follow-up visit somewhere within the next 2 weeks

## 2018-03-09 NOTE — Telephone Encounter (Signed)
Patient e-mailed a letter for Dr. Bary Leriche review.  See in yellow basket.

## 2018-03-10 ENCOUNTER — Other Ambulatory Visit: Payer: Self-pay | Admitting: Family Medicine

## 2018-03-10 DIAGNOSIS — Z79899 Other long term (current) drug therapy: Secondary | ICD-10-CM

## 2018-03-10 DIAGNOSIS — M2550 Pain in unspecified joint: Secondary | ICD-10-CM

## 2018-03-10 DIAGNOSIS — D649 Anemia, unspecified: Secondary | ICD-10-CM

## 2018-03-10 DIAGNOSIS — R5383 Other fatigue: Secondary | ICD-10-CM

## 2018-03-10 DIAGNOSIS — M791 Myalgia, unspecified site: Secondary | ICD-10-CM

## 2018-03-10 NOTE — Telephone Encounter (Signed)
Labs placed. Left message to return call

## 2018-03-10 NOTE — Telephone Encounter (Signed)
Pt returned call and verbalized understanding. Pt is scheduling an office visit

## 2018-03-11 DIAGNOSIS — D649 Anemia, unspecified: Secondary | ICD-10-CM | POA: Diagnosis not present

## 2018-03-11 DIAGNOSIS — Z79899 Other long term (current) drug therapy: Secondary | ICD-10-CM | POA: Diagnosis not present

## 2018-03-11 DIAGNOSIS — M2550 Pain in unspecified joint: Secondary | ICD-10-CM | POA: Diagnosis not present

## 2018-03-11 DIAGNOSIS — M791 Myalgia, unspecified site: Secondary | ICD-10-CM | POA: Diagnosis not present

## 2018-03-11 DIAGNOSIS — R5383 Other fatigue: Secondary | ICD-10-CM | POA: Diagnosis not present

## 2018-03-12 LAB — SEDIMENTATION RATE: SED RATE: 28 mm/h (ref 0–30)

## 2018-03-12 LAB — CBC WITH DIFFERENTIAL/PLATELET
BASOS ABS: 0 10*3/uL (ref 0.0–0.2)
Basos: 1 %
EOS (ABSOLUTE): 0.2 10*3/uL (ref 0.0–0.4)
Eos: 3 %
Hematocrit: 31.9 % — ABNORMAL LOW (ref 37.5–51.0)
Hemoglobin: 10.7 g/dL — ABNORMAL LOW (ref 13.0–17.7)
Immature Grans (Abs): 0 10*3/uL (ref 0.0–0.1)
Immature Granulocytes: 0 %
LYMPHS ABS: 1.2 10*3/uL (ref 0.7–3.1)
Lymphs: 15 %
MCH: 28.8 pg (ref 26.6–33.0)
MCHC: 33.5 g/dL (ref 31.5–35.7)
MCV: 86 fL (ref 79–97)
Monocytes Absolute: 0.8 10*3/uL (ref 0.1–0.9)
Monocytes: 10 %
NEUTROS ABS: 5.9 10*3/uL (ref 1.4–7.0)
Neutrophils: 71 %
PLATELETS: 395 10*3/uL (ref 150–450)
RBC: 3.71 x10E6/uL — AB (ref 4.14–5.80)
RDW: 12.7 % (ref 12.3–15.4)
WBC: 8.2 10*3/uL (ref 3.4–10.8)

## 2018-03-12 LAB — BASIC METABOLIC PANEL
BUN / CREAT RATIO: 29 — AB (ref 10–24)
BUN: 27 mg/dL (ref 8–27)
CHLORIDE: 96 mmol/L (ref 96–106)
CO2: 24 mmol/L (ref 20–29)
Calcium: 9.9 mg/dL (ref 8.6–10.2)
Creatinine, Ser: 0.93 mg/dL (ref 0.76–1.27)
GFR calc Af Amer: 88 mL/min/{1.73_m2} (ref >59)
GFR calc non Af Amer: 76 mL/min/{1.73_m2} (ref >59)
GLUCOSE: 131 mg/dL — AB (ref 65–99)
POTASSIUM: 5.7 mmol/L — AB (ref 3.5–5.2)
SODIUM: 135 mmol/L (ref 134–144)

## 2018-03-12 LAB — TSH: TSH: 2.8 u[IU]/mL (ref 0.450–4.500)

## 2018-03-12 LAB — C-REACTIVE PROTEIN: CRP: 44 mg/L — AB (ref 0–10)

## 2018-03-14 ENCOUNTER — Ambulatory Visit (INDEPENDENT_AMBULATORY_CARE_PROVIDER_SITE_OTHER): Payer: Medicare Other | Admitting: Cardiovascular Disease

## 2018-03-14 ENCOUNTER — Encounter: Payer: Self-pay | Admitting: Cardiovascular Disease

## 2018-03-14 VITALS — BP 140/77 | HR 78 | Wt 209.0 lb

## 2018-03-14 DIAGNOSIS — I25118 Atherosclerotic heart disease of native coronary artery with other forms of angina pectoris: Secondary | ICD-10-CM

## 2018-03-14 DIAGNOSIS — D649 Anemia, unspecified: Secondary | ICD-10-CM | POA: Diagnosis not present

## 2018-03-14 DIAGNOSIS — E785 Hyperlipidemia, unspecified: Secondary | ICD-10-CM | POA: Diagnosis not present

## 2018-03-14 DIAGNOSIS — E875 Hyperkalemia: Secondary | ICD-10-CM | POA: Diagnosis not present

## 2018-03-14 DIAGNOSIS — R7982 Elevated C-reactive protein (CRP): Secondary | ICD-10-CM | POA: Diagnosis not present

## 2018-03-14 DIAGNOSIS — I10 Essential (primary) hypertension: Secondary | ICD-10-CM

## 2018-03-14 DIAGNOSIS — Z955 Presence of coronary angioplasty implant and graft: Secondary | ICD-10-CM

## 2018-03-14 MED ORDER — CARVEDILOL 12.5 MG PO TABS: ORAL_TABLET | ORAL | 4 refills | Status: DC

## 2018-03-14 MED ORDER — QUINAPRIL HCL 10 MG PO TABS: 10.0000 mg | ORAL_TABLET | Freq: Every day | ORAL | 4 refills | Status: DC

## 2018-03-14 NOTE — Progress Notes (Signed)
SUBJECTIVE: The patient presents for routine follow up. He was bushhogging about a month ago and became fatigued and primarily slept for the next few days. He denies any chest pain or shortness of breath. He said his muscles and joints ached. He wonders if he was bitten by a spider or tick. He pulled about 20 ticks off of him but denies any rashes and fevers. He is beginning to feel better and said "I'm at about 50%".  He saw his PCP who ordered a series of blood tests which were significant for an elevated CRP (44), hyperkalemia (5.7), and anemia (Hgb 10.7).  His PCP is ordering additional labs and having him adjust some meds although he hasn't heard back from their office yet.  ECG performed in the office today which I ordered and personally interpreted demonstrates normal sinus rhythm with nonspecific IVCD and LAFB with late R wave transition (no significant changes since 01/14/17).  He underwent coronary angiography 02/04/17 which demonstrated mild residual coronary artery disease with a patent proximal LAD stent.     Review of Systems: As per "subjective", otherwise negative.  Allergies  Allergen Reactions  . Sulfa Antibiotics Other (See Comments)    "was terrible" "eye went crazy"  . Latex Other (See Comments)    bandaids cause blisters after 24 hours     Current Outpatient Medications  Medication Sig Dispense Refill  . amLODipine (NORVASC) 10 MG tablet TAKE (1) TABLET BY MOUTH ONCE DAILY. 90 tablet 1  . aspirin EC 81 MG tablet Take 81 mg by mouth daily.    Marland Kitchen atorvastatin (LIPITOR) 80 MG tablet TAKE (1) TABLET BY MOUTH ONCE DAILY. 90 tablet 1  . carvedilol (COREG) 12.5 MG tablet TAKE (1) TABLET BY MOUTH TWICE DAILY. 180 tablet 4  . COMBIGAN 0.2-0.5 % ophthalmic solution Place 1 drop into the left eye 2 (two) times daily.   2  . dutasteride (AVODART) 0.5 MG capsule TAKE ONE CAPSULE BY MOUTH ONCE DAILY. 90 capsule 1  . Emollient (CERAVE) CREA Apply 1 application topically  daily as needed (dry skin).    Marland Kitchen erythromycin ophthalmic ointment Place 1 application into the left eye 3 (three) times daily.    . Flaxseed, Linseed, (FLAX SEED OIL) 1000 MG CAPS Take 1,000 mg by mouth daily.    Marland Kitchen glucose blood (CONTOUR NEXT TEST) test strip USE ONE STRIP TO CHECK GLUCOSE THREE TIMES DAILY. DX E11.9 100 each 11  . Insulin Glargine (LANTUS SOLOSTAR) 100 UNIT/ML Solostar Pen INJECT 22 UNITS SUBCUTANEOUSLY ONCE DAILY AT  10  PM 15 mL 5  . JANUVIA 100 MG tablet TAKE (1) TABLET BY MOUTH ONCE DAILY. 30 tablet 5  . metFORMIN (GLUCOPHAGE) 1000 MG tablet TAKE (1) TABLET BY MOUTH TWICE A DAY WITH A MEAL. 180 tablet 1  . Multiple Vitamin (MULTIVITAMIN) tablet Take 1 tablet by mouth daily.    . nitroGLYCERIN (NITROSTAT) 0.4 MG SL tablet PLACE 1 TAB UNDER TONGUE EVERY 5 MIN IF NEEDED FOR CHEST PAIN. MAY USE 3 TIMES.NO RELIEF CALL 911. 25 tablet 3  . Polyvinyl Alcohol-Povidone (REFRESH OP) Place 1 drop into the left eye 2 (two) times daily.    Marland Kitchen PROAIR HFA 108 (90 Base) MCG/ACT inhaler INHALE 2 PUFFS INTO THE LUNGS EVERY 6 HOURS AS NEEDED FOR WHEEZING ORSHORTNESS OF BREATH. 8.5 g 5  . psyllium (METAMUCIL) 58.6 % powder Take 1 packet by mouth daily.     . quinapril (ACCUPRIL) 10 MG tablet Take 1 tablet (  10 mg total) by mouth daily. 90 tablet 4  . tamsulosin (FLOMAX) 0.4 MG CAPS capsule TAKE (1) CAPSULE BY MOUTH ONCE DAILY. 90 capsule 1   No current facility-administered medications for this visit.     Past Medical History:  Diagnosis Date  . Anginal pain ([place])   . Arthritis    Bilateral knee  . Coronary artery disease   . Diabetes mellitus    insulin dependent   . Hyperlipidemia   . Hypertension   . Progressive angina ([place]) 03/2015   3.0x12 mm Resolute DES to pLAD, Angiosculpt scoring balloon to dLAD  . Stroke [facility])     Past Surgical History:  Procedure Laterality Date  . APPENDECTOMY    . CARDIAC CATHETERIZATION N/A 04/12/2015   Procedure: Left Heart Cath and Coronary  Angiography;  Surgeon: Sherren Mocha, MD; pLAD 75>0% w/ 3.0x12 mm Resolute DES, dLAD 80>0% w/ Angiosculpt scoring balloon, CFX luminal irreg, RCA mild dz, EF 55-65%   . COLONOSCOPY  2011   RMR: 1. Normal rectum 2. Left sided diverticula , 2 ascending colon polyps, status post snare polypectomy. Remainder of colonic mucosa appeared unremarkable.   . COLONOSCOPY N/A 05/13/2016   Procedure: COLONOSCOPY;  Surgeon: Daneil Dolin, MD;  Location: [facility];  Service: Endoscopy;  Laterality: N/A;  7:30 AM  . CORONARY ANGIOGRAPHY N/A 02/04/2017   Procedure: CORONARY ANGIOGRAPHY (CATH LAB);  Surgeon: Troy Sine, MD;  Location: [facility] CV LAB;  Service: Cardiovascular;  Laterality: N/A;  . CORONARY ANGIOPLASTY WITH STENT PLACEMENT  08/22/2014   PTCA/DES X mLAD with 2.5 x 16 mm Promus Premier DES by Dr Julianne Handler  . FOOT SURGERY Right   . LEFT HEART CATHETERIZATION WITH CORONARY ANGIOGRAM N/A 08/22/2014   Procedure: LEFT HEART CATHETERIZATION WITH CORONARY ANGIOGRAM;  Surgeon: Burnell Blanks, MD;  Location: [facility] CATH LAB;  Service: Cardiovascular;  Laterality: N/A;  . PERCUTANEOUS CORONARY STENT INTERVENTION (PCI-S)  08/22/2014   Procedure: PERCUTANEOUS CORONARY STENT INTERVENTION (PCI-S);  Surgeon: Burnell Blanks, MD;   . SHOULDER SURGERY    . TONSILLECTOMY      Social History   Socioeconomic History  . Marital status: Married    Spouse name: Not on file  . Number of children: Not on file  . Years of education: 57  . Highest education level: Not on file  Occupational History  . Occupation: retired  Scientific laboratory technician  . Financial resource strain: Not on file  . Food insecurity:    Worry: Not on file    Inability: Not on file  . Transportation needs:    Medical: Not on file    Non-medical: Not on file  Tobacco Use  . Smoking status: Former Smoker    Packs/day: 1.00    Years: 20.00    Pack years: 20.00    Types: Cigarettes, Cigars    Start date: 08/10/1955    Last  attempt to quit: 07/28/2007    Years since quitting: 10.6  . Smokeless tobacco: Never Used  . Tobacco comment: Quit x 15 years  Substance and Sexual Activity  . Alcohol use: Yes    Alcohol/week: 0.0 standard drinks    Comment: Daily about 1 glass of wine  . Drug use: No  . Sexual activity: Not Currently  Lifestyle  . Physical activity:    Days per week: Not on file    Minutes per session: Not on file  . Stress: Not on file  Relationships  . Social connections:  Talks on phone: Not on file    Gets together: Not on file    Attends religious service: Not on file    Active member of club or organization: Not on file    Attends meetings of clubs or organizations: Not on file    Relationship status: Not on file  . Intimate partner violence:    Fear of current or ex partner: Not on file    Emotionally abused: Not on file    Physically abused: Not on file    Forced sexual activity: Not on file  Other Topics Concern  . Not on file  Social History Narrative  . Not on file     Vitals:   03/14/18 0806  BP: 140/77  Pulse: 78  SpO2: 96%  Weight: 209 lb (94.8 kg)    Wt Readings from Last 3 Encounters:  03/14/18 209 lb (94.8 kg)  01/11/18 212 lb (96.2 kg)  10/27/17 216 lb (98 kg)     PHYSICAL EXAM General: NAD HEENT: Normal. Neck: No JVD, no thyromegaly. Lungs: Clear to auscultation bilaterally with normal respiratory effort. CV: Regular rate and rhythm, normal S1/S2, no S3/S4, no murmur. No pretibial or periankle edema.  No carotid bruit.   Abdomen: Soft, nontender, no distention.  Neurologic: Alert and oriented.  Psych: Normal affect. Skin: Normal. Musculoskeletal: No gross deformities.    ECG: Reviewed above under Subjective   Labs: Lab Results  Component Value Date/Time   K 5.7 (H) 03/11/2018 10:30 AM   BUN 27 03/11/2018 10:30 AM   CREATININE 0.93 03/11/2018 10:30 AM   CREATININE 0.93 04/23/2014 07:04 AM   ALT 14 07/31/2017 08:18 AM   TSH 2.800  03/11/2018 10:30 AM   HGB 10.7 (L) 03/11/2018 10:30 AM     Lipids: Lab Results  Component Value Date/Time   LDLCALC 78 08/23/2017 07:44 AM   CHOL 155 08/23/2017 07:44 AM   TRIG 102 08/23/2017 07:44 AM   HDL 57 08/23/2017 07:44 AM       ASSESSMENT AND PLAN:  1.  Coronary artery disease with proximal LAD stent: Symptomatically stable.  Continue aspirin, atorvastatin, and carvedilol.  As his PCP recommends reducing the dose of quinapril to 10 mg, I will increase the dose of carvedilol to 12.5 mg twice daily for optimal blood pressure control.  2.  Hypertension: Blood pressure is mildly elevated.  His PCP recommends reducing the dose of quinapril to 10 mg due to hyperkalemia.  I will increase the dose of carvedilol to 12.5 mg twice daily in that case for optimal blood pressure control.  3.  Hyperlipidemia: I reviewed the lipid panel from 08/23/2017.  LDL is 78.  I will continue atorvastatin 80 mg daily.  4.  Hyperkalemia: Potassium is 5.7.  Please see discussion in #1 and #2.  Quinapril is being reduced to 10 mg daily as per PCP recommendations.  5.  Elevated CRP with anemia: Unclear etiology at this time but he has certainly had an inflammatory response.  Unclear to me if this is related to an insect bite.  No obvious rashes.  PCP is ordering additional blood work.   Disposition: Follow up 1 year   Kate Sable, M.D., F.A.C.C.

## 2018-03-14 NOTE — Patient Instructions (Addendum)
Your physician wants you to follow-up in:1 year  with Dr.Koneswaran You will receive a reminder letter in the mail two months in advance. If you don't receive a letter, please call our office to schedule the follow-up appointment.     DECREASE Accupril to 10 mg daily    INCREASE Coreg to 12.5 mg twice a day    If you need a refill on your cardiac medications before your next appointment, please call your pharmacy.     No labs or tests today.      Thank you for choosing [facility] !

## 2018-03-15 NOTE — Telephone Encounter (Signed)
Pt checking on lab orders. He states he hasnt received a call with instructions for labs

## 2018-03-15 NOTE — Telephone Encounter (Signed)
Results discussed with patient. Patient advised per Dr Nicki Reaper:   The patient's hemoglobin has dropped some. Please let the patient know that this could cause him to feel fatigued. It needs further looking into. Dr Nicki Reaper recommends TIBC, ferritin, stool testing for blood-he has done this before but we need to repeat it. He does need to keep his follow-up visit in September. Depending on the results of the test we may need to do additional testing.   Also his potassium is running high which could be related to his blood pressure medicine Accupril. This needs to be reduced, new dose to 10 mg daily, may have 30-day supply with 5 refills, also recommend that he do a metabolic 7 in approximately 2 weeks weeks-we could do all of this lab work at the same time   Patient verbalized understanding. Blood work ordered in [employer]. Stool testing ordered in epic and left up front for patient. Patient had his Accupril decreased to 10mg  daily yesterday by his cardiologist Dr Louanna Raw.

## 2018-03-17 ENCOUNTER — Other Ambulatory Visit: Payer: Self-pay

## 2018-03-17 DIAGNOSIS — D649 Anemia, unspecified: Secondary | ICD-10-CM

## 2018-03-17 DIAGNOSIS — R195 Other fecal abnormalities: Secondary | ICD-10-CM

## 2018-03-17 LAB — IFOBT (OCCULT BLOOD): IFOBT: POSITIVE

## 2018-03-29 DIAGNOSIS — Z794 Long term (current) use of insulin: Secondary | ICD-10-CM | POA: Diagnosis not present

## 2018-03-29 DIAGNOSIS — I10 Essential (primary) hypertension: Secondary | ICD-10-CM | POA: Diagnosis not present

## 2018-03-29 DIAGNOSIS — E1140 Type 2 diabetes mellitus with diabetic neuropathy, unspecified: Secondary | ICD-10-CM | POA: Diagnosis not present

## 2018-03-29 DIAGNOSIS — E782 Mixed hyperlipidemia: Secondary | ICD-10-CM | POA: Diagnosis not present

## 2018-03-30 LAB — LIPID PANEL
CHOL/HDL RATIO: 2.8 ratio (ref 0.0–5.0)
CHOLESTEROL TOTAL: 129 mg/dL (ref 100–199)
HDL: 46 mg/dL (ref >39)
LDL CALC: 63 mg/dL (ref 0–99)
TRIGLYCERIDES: 102 mg/dL (ref 0–149)
VLDL Cholesterol Cal: 20 mg/dL (ref 5–40)

## 2018-03-30 LAB — HEMOGLOBIN A1C
Est. average glucose Bld gHb Est-mCnc: 166 mg/dL
Hgb A1c MFr Bld: 7.4 % — ABNORMAL HIGH (ref 4.8–5.6)

## 2018-03-30 LAB — BASIC METABOLIC PANEL
BUN/Creatinine Ratio: 23 (ref 10–24)
BUN: 23 mg/dL (ref 8–27)
CALCIUM: 9.5 mg/dL (ref 8.6–10.2)
CO2: 24 mmol/L (ref 20–29)
Chloride: 100 mmol/L (ref 96–106)
Creatinine, Ser: 1 mg/dL (ref 0.76–1.27)
GFR, EST AFRICAN AMERICAN: 81 mL/min/{1.73_m2} (ref >59)
GFR, EST NON AFRICAN AMERICAN: 70 mL/min/{1.73_m2} (ref >59)
Glucose: 142 mg/dL — ABNORMAL HIGH (ref 65–99)
POTASSIUM: 5.2 mmol/L (ref 3.5–5.2)
Sodium: 137 mmol/L (ref 134–144)

## 2018-03-30 LAB — MICROALBUMIN / CREATININE URINE RATIO
CREATININE, UR: 63 mg/dL
MICROALB/CREAT RATIO: 206.7 mg/g{creat} — AB (ref 0.0–30.0)
MICROALBUM., U, RANDOM: 130.2 ug/mL

## 2018-04-04 ENCOUNTER — Encounter: Payer: Self-pay | Admitting: Family Medicine

## 2018-04-04 ENCOUNTER — Ambulatory Visit (INDEPENDENT_AMBULATORY_CARE_PROVIDER_SITE_OTHER): Payer: Medicare Other | Admitting: Family Medicine

## 2018-04-04 VITALS — BP 130/84 | Ht 72.0 in | Wt 212.0 lb

## 2018-04-04 DIAGNOSIS — D649 Anemia, unspecified: Secondary | ICD-10-CM | POA: Diagnosis not present

## 2018-04-04 DIAGNOSIS — E782 Mixed hyperlipidemia: Secondary | ICD-10-CM | POA: Diagnosis not present

## 2018-04-04 DIAGNOSIS — W57XXXA Bitten or stung by nonvenomous insect and other nonvenomous arthropods, initial encounter: Secondary | ICD-10-CM | POA: Diagnosis not present

## 2018-04-04 DIAGNOSIS — Z794 Long term (current) use of insulin: Secondary | ICD-10-CM

## 2018-04-04 DIAGNOSIS — E1140 Type 2 diabetes mellitus with diabetic neuropathy, unspecified: Secondary | ICD-10-CM

## 2018-04-04 DIAGNOSIS — Z79899 Other long term (current) drug therapy: Secondary | ICD-10-CM

## 2018-04-04 DIAGNOSIS — M791 Myalgia, unspecified site: Secondary | ICD-10-CM

## 2018-04-04 DIAGNOSIS — D500 Iron deficiency anemia secondary to blood loss (chronic): Secondary | ICD-10-CM | POA: Diagnosis not present

## 2018-04-04 DIAGNOSIS — I25118 Atherosclerotic heart disease of native coronary artery with other forms of angina pectoris: Secondary | ICD-10-CM | POA: Diagnosis not present

## 2018-04-04 LAB — POCT HEMOGLOBIN: Hemoglobin: 9.6 g/dL — AB (ref 14.1–18.1)

## 2018-04-04 NOTE — Progress Notes (Signed)
   Subjective:    Patient ID: Jerry Burns, male    DOB: [birth year], 84 y.o.   MRN: [id number]  Hyperlipidemia  This is a chronic problem. The current episode started more than 1 year ago. Pertinent negatives include no chest pain or shortness of breath. Current antihyperlipidemic treatment includes diet change.   Patient also has had his recent labs including hgbA1c and would like to discuss results.  Patient also having a lot of joint pain since receiving his shingles shot Patient with pain in right foot, loss of appetite and taste and a feeling of mental and physical lethargy. The patient brings in a 1 page narration that talks about how is had a very bad past several months having combination of knee pain body aches muscle aches in addition does a lot of fatigue tiredness gives out of energy gets short of breath with activity denies melena denies vomiting denies chest pressure tightness pain high fever or chills he does relate a bite that happened back in the summer several of them he wonders if this has anything to do with how he is doing currently Patietn has appt with GI doctor on October 8 for positive hemosure.  Review of Systems  Constitutional: Negative for diaphoresis and fatigue.  HENT: Negative for congestion and rhinorrhea.   Respiratory: Negative for cough and shortness of breath.   Cardiovascular: Negative for chest pain and leg swelling.  Gastrointestinal: Negative for abdominal pain and diarrhea.  Skin: Negative for color change and rash.  Neurological: Negative for dizziness and headaches.  Psychiatric/Behavioral: Negative for behavioral problems and confusion.   Results for orders placed or performed in visit on 04/04/18  POCT hemoglobin  Result Value Ref Range   Hemoglobin 9.6 (A) 14.1 - 18.1 g/dL      Objective:   Physical Exam  Constitutional: He appears well-nourished. No distress.  HENT:  Head: Normocephalic and atraumatic.  Eyes: Right eye exhibits no  discharge. Left eye exhibits no discharge.  Neck: No tracheal deviation present.  Cardiovascular: Normal rate, regular rhythm and normal heart sounds.  No murmur heard. Pulmonary/Chest: Effort normal and breath sounds normal. No respiratory distress.  Musculoskeletal: He exhibits no edema.  Lymphadenopathy:    He has no cervical adenopathy.  Neurological: He is alert. Coordination normal.  Skin: Skin is warm and dry.  Psychiatric: He has a normal mood and affect. His behavior is normal.  Vitals reviewed.   Extremely nice gentleman Foot exam normal with mild neuropathy of the feet   25 minutes was spent with the patient.  This statement verifies that 25 minutes was indeed spent with the patient.  More than 50% of this visit-total duration of the visit-was spent in counseling and coordination of care. The issues that the patient came in for today as reflected in the diagnosis (s) please refer to documentation for further details.     Assessment & Plan:  Has significant anemia is getting worse I recommend further lab we will need to see if we can get him in sooner with gastroenterology I think he is going need at the very least a EGD since he is on 81 mg aspirin  Hyperlipidemia taking his medication watching his diet previous labs reviewed measures  Diabetic neuropathy noted diabetes noted fair control could be much better watch diet closely watch medication try to become more active  Significant myalgias with fine we will rule out possibility of Lyme's disease lab work ordered await results

## 2018-04-05 LAB — IRON AND TIBC
Iron Saturation: 12 % — ABNORMAL LOW (ref 15–55)
Iron: 30 ug/dL — ABNORMAL LOW (ref 38–169)
Total Iron Binding Capacity: 253 ug/dL (ref 250–450)
UIBC: 223 ug/dL (ref 111–343)

## 2018-04-05 LAB — CBC WITH DIFFERENTIAL/PLATELET
Basophils Absolute: 0.1 10*3/uL (ref 0.0–0.2)
Basos: 1 %
EOS (ABSOLUTE): 0.3 10*3/uL (ref 0.0–0.4)
EOS: 5 %
HEMATOCRIT: 30.4 % — AB (ref 37.5–51.0)
Hemoglobin: 10 g/dL — ABNORMAL LOW (ref 13.0–17.7)
IMMATURE GRANULOCYTES: 0 %
Immature Grans (Abs): 0 10*3/uL (ref 0.0–0.1)
Lymphocytes Absolute: 1.2 10*3/uL (ref 0.7–3.1)
Lymphs: 17 %
MCH: 28.5 pg (ref 26.6–33.0)
MCHC: 32.9 g/dL (ref 31.5–35.7)
MCV: 87 fL (ref 79–97)
MONOS ABS: 0.7 10*3/uL (ref 0.1–0.9)
Monocytes: 10 %
Neutrophils Absolute: 5 10*3/uL (ref 1.4–7.0)
Neutrophils: 67 %
PLATELETS: 322 10*3/uL (ref 150–450)
RBC: 3.51 x10E6/uL — ABNORMAL LOW (ref 4.14–5.80)
RDW: 12.2 % — AB (ref 12.3–15.4)
WBC: 7.4 10*3/uL (ref 3.4–10.8)

## 2018-04-05 LAB — FERRITIN: Ferritin: 125 ng/mL (ref 30–400)

## 2018-04-05 LAB — B. BURGDORFI ANTIBODIES: Lyme IgG/IgM Ab: <0.91 {ISR} (ref 0.00–0.90)

## 2018-04-11 ENCOUNTER — Ambulatory Visit (INDEPENDENT_AMBULATORY_CARE_PROVIDER_SITE_OTHER): Payer: Medicare Other | Admitting: Nurse Practitioner

## 2018-04-11 ENCOUNTER — Other Ambulatory Visit: Payer: Self-pay | Admitting: *Deleted

## 2018-04-11 ENCOUNTER — Encounter: Payer: Self-pay | Admitting: *Deleted

## 2018-04-11 ENCOUNTER — Encounter: Payer: Self-pay | Admitting: Nurse Practitioner

## 2018-04-11 DIAGNOSIS — R195 Other fecal abnormalities: Secondary | ICD-10-CM | POA: Diagnosis not present

## 2018-04-11 DIAGNOSIS — D649 Anemia, unspecified: Secondary | ICD-10-CM

## 2018-04-11 DIAGNOSIS — I25118 Atherosclerotic heart disease of native coronary artery with other forms of angina pectoris: Secondary | ICD-10-CM | POA: Diagnosis not present

## 2018-04-11 MED ORDER — NA SULFATE-K SULFATE-MG SULF 17.5-3.13-1.6 GM/177ML PO SOLN: 1.0000 | ORAL | 0 refills | Status: DC

## 2018-04-11 NOTE — Assessment & Plan Note (Signed)
PCP indicates heme+ stool which was completed due to acute decline in hgb (as per above). Denies overt GI symptoms today. Has not seen hematochezia or melena to indicate upper versus lower GI bleed.  He does have a high risk for colon cancer due to previous polyps.  His maternal uncle did have colon cancer as well.  His colonoscopy was completed about 2 years ago.  Given heme positive stool of unknown source we will follow-up with colonoscopy and endoscopy as per below.  Follow-up in 3 months.

## 2018-04-11 NOTE — Assessment & Plan Note (Signed)
Patient does have a chronic history of mild anemia but his hemoglobin is typically between 11.5 and 12.5.  Recent labs have shown significant decline in his hemoglobin between 1 and 2 g.  This is in the setting of heme positive stool.  He has never had an endoscopy before.  He has had a colonoscopy 2 years ago which found a tubular adenoma polyp which was completed for high risk screening and recommended no further screening, but can consider further colonoscopy for symptoms.  Given heme positive stool, acute worsening of anemia we will proceed with colonoscopy and upper endoscopy.  We can consider a Givens capsule if needed based on findings.  Follow-up in 3 months.  Proceed with TCS and EGD with Dr. Gala Romney in near future: the risks, benefits, and alternatives have been discussed with the patient in detail. The patient states understanding and desires to proceed.  The patient is not on any anticoagulants, anxiolytics, chronic pain medications, or antidepressants.  Conscious sedation should be adequate for his procedure as it was for his last.

## 2018-04-11 NOTE — Patient Instructions (Signed)
1. We will schedule your procedures for you. 2. Further recommendations will be made after your procedures. 3. We may consider a pill capsule endoscopy of your small intestines depending on your results. 4. Return for follow-up in 3 months. 5. Call us if you have any questions or concerns.  At [facility] we value your feedback. You may receive a survey about your visit today. Please share your experience as we strive to create trusting relationships with our patients to provide genuine, compassionate, quality care.  We appreciate your understanding and patience as we review any laboratory studies, imaging, and other diagnostic tests that are ordered as we care for you. Our office policy is 5 business days for review of these results, and any emergent or urgent results are addressed in a timely manner for your best interest. If you do not hear from our office in 1 week, please contact us.   We also encourage the use of MyChart, which contains your medical information for your review as well. If you are not enrolled in this feature, an access code is on this after visit summary for your convenience. Thank you for allowing [place] to be involved in your care.  It was great to see you today!  I hope you have a great last week of summer and a great Fall!!

## 2018-04-11 NOTE — Progress Notes (Signed)
Referring Provider: Kathyrn Drown, MD Primary Care Physician:  Kathyrn Drown, MD Primary GI:  Dr. Gala Romney  Chief Complaint  Patient presents with  . Anemia  . positive hemoccult    HPI:   Jerry Burns is a 84 y.o. male who presents on referral from primary care for heme positive stool and anemia.  The patient was last seen in our office 04/23/2016 for history of colon polyps.  At that time it was noted he was due for repeat colonoscopy in 2017 but had to delay due to cardiac stenting and Plavix.  Previous colonoscopy in 2011 with diverticular disease, tubular adenoma, recommended 5-year repeat (2016).  At his last visit he was doing well overall.  No GI symptoms.  Plavix had been discontinued as per his last office visit.  Recommended schedule and complete colonoscopy at that time.  Colonoscopy for high risk screening completed 05/13/2016 which found sigmoid and descending colon diverticular disease, single 5 mm polyp in the sigmoid colon, otherwise normal.  Surgical pathology found the polyp to be either adenoma.  Recommended no future colonoscopy unless symptoms develop.  Was completed 04/04/2018 found somewhat worsening anemia with a hemoglobin of 10.0 which was normocytic and normochromic.  Ferritin normal at 125, iron low at 30 and saturation low at 12%.  Reviewing previous records he has been mildly anemic since 2016 but this is generally in the 11.5-12.5 range until August 2019 when he declined to 10.7, 9.6, 10.0.  Today he states he's doing ok overall. He saw his PCP, they they did some routine blood work and has worsening anemia. His stool test was positive for blood. Denies abdominal pain, N/V, hematochezia, melena. Has noted some increase in fatigue over the past 2 months, but denies overt weakness. Denies fever, chills, unintentional weight loss. Denies chest pain, dyspnea, dizziness, lightheadedness, syncope, near syncope. Denies any other upper or lower GI symptoms.  Past  Medical History:  Diagnosis Date  . Anginal pain ([place])   . Arthritis    Bilateral knee  . Coronary artery disease   . Diabetes mellitus    insulin dependent   . Hyperlipidemia   . Hypertension   . Progressive angina ([place]) 03/2015   3.0x12 mm Resolute DES to pLAD, Angiosculpt scoring balloon to dLAD  . Stroke [facility])     Past Surgical History:  Procedure Laterality Date  . APPENDECTOMY    . CARDIAC CATHETERIZATION N/A 04/12/2015   Procedure: Left Heart Cath and Coronary Angiography;  Surgeon: Sherren Mocha, MD; pLAD 75>0% w/ 3.0x12 mm Resolute DES, dLAD 80>0% w/ Angiosculpt scoring balloon, CFX luminal irreg, RCA mild dz, EF 55-65%   . COLONOSCOPY  2011   RMR: 1. Normal rectum 2. Left sided diverticula , 2 ascending colon polyps, status post snare polypectomy. Remainder of colonic mucosa appeared unremarkable.   . COLONOSCOPY N/A 05/13/2016   Procedure: COLONOSCOPY;  Surgeon: Daneil Dolin, MD;  Location: [facility];  Service: Endoscopy;  Laterality: N/A;  7:30 AM  . CORONARY ANGIOGRAPHY N/A 02/04/2017   Procedure: CORONARY ANGIOGRAPHY (CATH LAB);  Surgeon: Troy Sine, MD;  Location: [facility];  Service: Cardiovascular;  Laterality: N/A;  . CORONARY ANGIOPLASTY WITH STENT PLACEMENT  08/22/2014   PTCA/DES X mLAD with 2.5 x 16 mm Promus Premier DES by Dr Julianne Handler  . FOOT SURGERY Right   . LEFT HEART CATHETERIZATION WITH CORONARY ANGIOGRAM N/A 08/22/2014   Procedure: LEFT HEART CATHETERIZATION WITH CORONARY ANGIOGRAM;  Surgeon: Harrell Gave  Santina Evans, MD;  Location: [facility] CATH LAB;  Service: Cardiovascular;  Laterality: N/A;  . PERCUTANEOUS CORONARY STENT INTERVENTION (PCI-S)  08/22/2014   Procedure: PERCUTANEOUS CORONARY STENT INTERVENTION (PCI-S);  Surgeon: Burnell Blanks, MD;   . SHOULDER SURGERY    . TONSILLECTOMY      Current Outpatient Medications  Medication Sig Dispense Refill  . amLODipine (NORVASC) 10 MG tablet TAKE (1) TABLET BY MOUTH ONCE DAILY. 90  tablet 1  . aspirin EC 81 MG tablet Take 81 mg by mouth daily.    Marland Kitchen atorvastatin (LIPITOR) 80 MG tablet TAKE (1) TABLET BY MOUTH ONCE DAILY. 90 tablet 1  . carvedilol (COREG) 12.5 MG tablet TAKE (1) TABLET BY MOUTH TWICE DAILY. 180 tablet 4  . COMBIGAN 0.2-0.5 % ophthalmic solution Place 1 drop into the left eye 2 (two) times daily.   2  . dutasteride (AVODART) 0.5 MG capsule TAKE ONE CAPSULE BY MOUTH ONCE DAILY. 90 capsule 1  . Emollient (CERAVE) CREA Apply 1 application topically daily as needed (dry skin).    Marland Kitchen erythromycin ophthalmic ointment Place 1 application into the left eye 3 (three) times daily.    . Flaxseed, Linseed, (FLAX SEED OIL) 1000 MG CAPS Take 1,000 mg by mouth daily.    Marland Kitchen glucose blood (CONTOUR NEXT TEST) test strip USE ONE STRIP TO CHECK GLUCOSE THREE TIMES DAILY. DX E11.9 100 each 11  . Insulin Glargine (LANTUS SOLOSTAR) 100 UNIT/ML Solostar Pen INJECT 22 UNITS SUBCUTANEOUSLY ONCE DAILY AT  10  PM 15 mL 5  . JANUVIA 100 MG tablet TAKE (1) TABLET BY MOUTH ONCE DAILY. 30 tablet 5  . metFORMIN (GLUCOPHAGE) 1000 MG tablet TAKE (1) TABLET BY MOUTH TWICE A DAY WITH A MEAL. 180 tablet 1  . Multiple Vitamin (MULTIVITAMIN) tablet Take 1 tablet by mouth daily.    . nitroGLYCERIN (NITROSTAT) 0.4 MG SL tablet PLACE 1 TAB UNDER TONGUE EVERY 5 MIN IF NEEDED FOR CHEST PAIN. MAY USE 3 TIMES.NO RELIEF CALL 911. 25 tablet 3  . Polyvinyl Alcohol-Povidone (REFRESH OP) Place 1 drop into the left eye 2 (two) times daily.    Marland Kitchen PROAIR HFA 108 (90 Base) MCG/ACT inhaler INHALE 2 PUFFS INTO THE LUNGS EVERY 6 HOURS AS NEEDED FOR WHEEZING ORSHORTNESS OF BREATH. 8.5 g 5  . psyllium (METAMUCIL) 58.6 % powder Take 1 packet by mouth daily.     . quinapril (ACCUPRIL) 10 MG tablet Take 1 tablet (10 mg total) by mouth daily. 90 tablet 4  . tamsulosin (FLOMAX) 0.4 MG CAPS capsule TAKE (1) CAPSULE BY MOUTH ONCE DAILY. 90 capsule 1   No current facility-administered medications for this visit.     Allergies  as of 04/11/2018 - Review Complete 04/11/2018  Allergen Reaction Noted  . Sulfa antibiotics Other (See Comments) 06/04/2015  . Latex Other (See Comments) 06/29/2012    Family History  Problem Relation Age of Onset  . Diabetes Unknown   . Diabetes Brother   . Colon cancer Maternal Uncle   . Skin cancer Mother   . Diabetes Father   . Hypertension Sister     Social History   Socioeconomic History  . Marital status: Married    Spouse name: Not on file  . Number of children: Not on file  . Years of education: 22  . Highest education level: Not on file  Occupational History  . Occupation: retired  Scientific laboratory technician  . Financial resource strain: Not on file  . Food insecurity:  Worry: Not on file    Inability: Not on file  . Transportation needs:    Medical: Not on file    Non-medical: Not on file  Tobacco Use  . Smoking status: Former Smoker    Packs/day: 1.00    Years: 20.00    Pack years: 20.00    Types: Cigarettes, Cigars    Start date: 08/10/1955    Last attempt to quit: 07/28/2007    Years since quitting: 10.7  . Smokeless tobacco: Never Used  . Tobacco comment: Quit x 15 years  Substance and Sexual Activity  . Alcohol use: Yes    Alcohol/week: 0.0 standard drinks    Comment: Daily about 1 glass of wine  . Drug use: No  . Sexual activity: Not Currently  Lifestyle  . Physical activity:    Days per week: Not on file    Minutes per session: Not on file  . Stress: Not on file  Relationships  . Social connections:    Talks on phone: Not on file    Gets together: Not on file    Attends religious service: Not on file    Active member of club or organization: Not on file    Attends meetings of clubs or organizations: Not on file    Relationship status: Not on file  Other Topics Concern  . Not on file  Social History Narrative  . Not on file    Review of Systems: General: Negative for anorexia, weight loss, fever, chills, fatigue, weakness. ENT: Negative for  hoarseness, difficulty swallowing. CV: Negative for chest pain, angina, palpitations, peripheral edema.  Respiratory: Negative for dyspnea at rest, cough, sputum, wheezing.  GI: See history of present illness. Endo: Negative for unusual weight change.  Heme: Negative for bruising or bleeding. Allergy: Negative for rash or hives.   Physical Exam: BP 140/66   Pulse 69   Temp (!) 97.5 F (36.4 C) (Oral)   Ht 6' (1.829 m)   Wt 210 lb 6.4 oz (95.4 kg)   BMI 28.54 kg/m  General:   Alert and oriented. Pleasant and cooperative. Well-nourished and well-developed.  Eyes:  Without icterus, sclera clear and conjunctiva pink.  Ears:  Normal auditory acuity. Cardiovascular:  S1, S2 present without murmurs appreciated. Extremities without clubbing or edema. Respiratory:  Clear to auscultation bilaterally. No wheezes, rales, or rhonchi. No distress.  Gastrointestinal:  +BS, soft, non-tender and non-distended. No HSM noted. No guarding or rebound. No masses appreciated.  Rectal:  Deferred  Musculoskalatal:  Symmetrical without gross deformities. Neurologic:  Alert and oriented x4;  grossly normal neurologically. Psych:  Alert and cooperative. Normal mood and affect. Heme/Lymph/Immune: No excessive bruising noted.    04/11/2018 11:46 AM   Disclaimer: This note was dictated with voice recognition software. Similar sounding words can inadvertently be transcribed and may not be corrected upon review.

## 2018-04-11 NOTE — Progress Notes (Signed)
cc'd to pcp 

## 2018-04-22 ENCOUNTER — Other Ambulatory Visit: Payer: Self-pay | Admitting: Family Medicine

## 2018-04-22 ENCOUNTER — Other Ambulatory Visit: Payer: Self-pay | Admitting: Cardiovascular Disease

## 2018-04-26 ENCOUNTER — Telehealth: Payer: Self-pay | Admitting: Internal Medicine

## 2018-04-26 ENCOUNTER — Other Ambulatory Visit: Payer: Self-pay

## 2018-04-26 DIAGNOSIS — R195 Other fecal abnormalities: Secondary | ICD-10-CM

## 2018-04-26 NOTE — Telephone Encounter (Signed)
Noted. Lab orders faxed.

## 2018-04-26 NOTE — Telephone Encounter (Signed)
Bethena Roys from [facility] called to say patient was there and they do not have his lab orders. Please fax to [phone]

## 2018-05-03 ENCOUNTER — Ambulatory Visit: Payer: Medicare Other | Admitting: Nurse Practitioner

## 2018-05-04 ENCOUNTER — Telehealth: Payer: Self-pay | Admitting: Internal Medicine

## 2018-05-04 NOTE — Telephone Encounter (Signed)
Spoke with pt, lab orders were faxed over on 04/26/18, the day I received a call from Mellen at the lab.

## 2018-05-04 NOTE — Telephone Encounter (Signed)
PATIENT CALLED INQUIRING IF HE IS DUE FOR LAB WORK. PLEASE CALL

## 2018-05-10 DIAGNOSIS — R195 Other fecal abnormalities: Secondary | ICD-10-CM | POA: Diagnosis not present

## 2018-05-11 LAB — CBC WITH DIFFERENTIAL/PLATELET
BASOS: 1 %
Basophils Absolute: 0.1 10*3/uL (ref 0.0–0.2)
EOS (ABSOLUTE): 0.4 10*3/uL (ref 0.0–0.4)
Eos: 6 %
Hematocrit: 32.4 % — ABNORMAL LOW (ref 37.5–51.0)
Hemoglobin: 10.4 g/dL — ABNORMAL LOW (ref 13.0–17.7)
Immature Grans (Abs): 0 10*3/uL (ref 0.0–0.1)
Immature Granulocytes: 1 %
LYMPHS ABS: 1 10*3/uL (ref 0.7–3.1)
Lymphs: 15 %
MCH: 28 pg (ref 26.6–33.0)
MCHC: 32.1 g/dL (ref 31.5–35.7)
MCV: 87 fL (ref 79–97)
MONOS ABS: 0.9 10*3/uL (ref 0.1–0.9)
Monocytes: 13 %
NEUTROS ABS: 4.3 10*3/uL (ref 1.4–7.0)
Neutrophils: 64 %
PLATELETS: 261 10*3/uL (ref 150–450)
RBC: 3.71 x10E6/uL — ABNORMAL LOW (ref 4.14–5.80)
RDW: 13.5 % (ref 12.3–15.4)
WBC: 6.7 10*3/uL (ref 3.4–10.8)

## 2018-05-13 ENCOUNTER — Telehealth: Payer: Self-pay | Admitting: Internal Medicine

## 2018-05-13 MED ORDER — NA SULFATE-K SULFATE-MG SULF 17.5-3.13-1.6 GM/177ML PO SOLN: 1.0000 | ORAL | 0 refills | Status: DC

## 2018-05-13 NOTE — Telephone Encounter (Signed)
Spoke with patient and confirmed times. He states he has his instructions. He did not pick up prep when sent in from [facility]. I have resent prep into [facility] per pt request.

## 2018-05-13 NOTE — Telephone Encounter (Signed)
Pt called to confirm date and time of procedure next week and needs his prep rx called into [facility]

## 2018-05-18 ENCOUNTER — Encounter (HOSPITAL_COMMUNITY): Payer: Self-pay | Admitting: *Deleted

## 2018-05-18 ENCOUNTER — Ambulatory Visit (HOSPITAL_COMMUNITY)
Admission: RE | Admit: 2018-05-18 | Discharge: 2018-05-18 | Disposition: A | Discharge status: Home / Self Care | Payer: Medicare Other | Source: Ambulatory Visit | Attending: Internal Medicine | Admitting: Internal Medicine

## 2018-05-18 ENCOUNTER — Other Ambulatory Visit: Payer: Self-pay

## 2018-05-18 ENCOUNTER — Encounter (HOSPITAL_COMMUNITY)
Admission: RE | Disposition: A | Discharge status: Home / Self Care | Payer: Self-pay | Source: Ambulatory Visit | Attending: Internal Medicine

## 2018-05-18 DIAGNOSIS — Z7982 Long term (current) use of aspirin: Secondary | ICD-10-CM | POA: Diagnosis not present

## 2018-05-18 DIAGNOSIS — K2950 Unspecified chronic gastritis without bleeding: Secondary | ICD-10-CM | POA: Insufficient documentation

## 2018-05-18 DIAGNOSIS — K210 Gastro-esophageal reflux disease with esophagitis: Secondary | ICD-10-CM | POA: Insufficient documentation

## 2018-05-18 DIAGNOSIS — Z955 Presence of coronary angioplasty implant and graft: Secondary | ICD-10-CM | POA: Diagnosis not present

## 2018-05-18 DIAGNOSIS — D649 Anemia, unspecified: Secondary | ICD-10-CM | POA: Insufficient documentation

## 2018-05-18 DIAGNOSIS — K5730 Diverticulosis of large intestine without perforation or abscess without bleeding: Secondary | ICD-10-CM | POA: Diagnosis not present

## 2018-05-18 DIAGNOSIS — E119 Type 2 diabetes mellitus without complications: Secondary | ICD-10-CM | POA: Diagnosis not present

## 2018-05-18 DIAGNOSIS — K3189 Other diseases of stomach and duodenum: Secondary | ICD-10-CM | POA: Diagnosis not present

## 2018-05-18 DIAGNOSIS — I10 Essential (primary) hypertension: Secondary | ICD-10-CM | POA: Diagnosis not present

## 2018-05-18 DIAGNOSIS — Z794 Long term (current) use of insulin: Secondary | ICD-10-CM | POA: Insufficient documentation

## 2018-05-18 DIAGNOSIS — F17290 Nicotine dependence, other tobacco product, uncomplicated: Secondary | ICD-10-CM | POA: Insufficient documentation

## 2018-05-18 DIAGNOSIS — M170 Bilateral primary osteoarthritis of knee: Secondary | ICD-10-CM | POA: Insufficient documentation

## 2018-05-18 DIAGNOSIS — K219 Gastro-esophageal reflux disease without esophagitis: Secondary | ICD-10-CM | POA: Diagnosis not present

## 2018-05-18 DIAGNOSIS — K2951 Unspecified chronic gastritis with bleeding: Secondary | ICD-10-CM | POA: Diagnosis not present

## 2018-05-18 DIAGNOSIS — K449 Diaphragmatic hernia without obstruction or gangrene: Secondary | ICD-10-CM | POA: Diagnosis not present

## 2018-05-18 DIAGNOSIS — I2510 Atherosclerotic heart disease of native coronary artery without angina pectoris: Secondary | ICD-10-CM | POA: Insufficient documentation

## 2018-05-18 DIAGNOSIS — Z79899 Other long term (current) drug therapy: Secondary | ICD-10-CM | POA: Diagnosis not present

## 2018-05-18 DIAGNOSIS — R195 Other fecal abnormalities: Secondary | ICD-10-CM | POA: Diagnosis not present

## 2018-05-18 DIAGNOSIS — B9681 Helicobacter pylori [H. pylori] as the cause of diseases classified elsewhere: Secondary | ICD-10-CM | POA: Insufficient documentation

## 2018-05-18 DIAGNOSIS — Z791 Long term (current) use of non-steroidal anti-inflammatories (NSAID): Secondary | ICD-10-CM | POA: Diagnosis not present

## 2018-05-18 DIAGNOSIS — K2970 Gastritis, unspecified, without bleeding: Secondary | ICD-10-CM

## 2018-05-18 DIAGNOSIS — E785 Hyperlipidemia, unspecified: Secondary | ICD-10-CM | POA: Insufficient documentation

## 2018-05-18 DIAGNOSIS — D123 Benign neoplasm of transverse colon: Secondary | ICD-10-CM | POA: Insufficient documentation

## 2018-05-18 DIAGNOSIS — Z8673 Personal history of transient ischemic attack (TIA), and cerebral infarction without residual deficits: Secondary | ICD-10-CM | POA: Diagnosis not present

## 2018-05-18 HISTORY — PX: COLONOSCOPY: SHX5424

## 2018-05-18 HISTORY — PX: ESOPHAGOGASTRODUODENOSCOPY: SHX5428

## 2018-05-18 LAB — GLUCOSE, CAPILLARY: Glucose-Capillary: 135 mg/dL — ABNORMAL HIGH (ref 70–99)

## 2018-05-18 SURGERY — COLONOSCOPY
Anesthesia: Moderate Sedation

## 2018-05-18 MED ORDER — MEPERIDINE HCL 100 MG/ML IJ SOLN
INTRAMUSCULAR | Status: DC | PRN
  Administered 2018-05-18: 25 mg via INTRAVENOUS

## 2018-05-18 MED ORDER — STERILE WATER FOR IRRIGATION IR SOLN
Status: DC | PRN
  Administered 2018-05-18: 1.5 mL

## 2018-05-18 MED ORDER — MEPERIDINE HCL 50 MG/ML IJ SOLN
INTRAMUSCULAR | Status: AC
  Filled 2018-05-18: qty 1

## 2018-05-18 MED ORDER — LIDOCAINE VISCOUS HCL 2 % MT SOLN
OROMUCOSAL | Status: AC
  Filled 2018-05-18: qty 15

## 2018-05-18 MED ORDER — MIDAZOLAM HCL 5 MG/5ML IJ SOLN
INTRAMUSCULAR | Status: AC
  Filled 2018-05-18: qty 10

## 2018-05-18 MED ORDER — ONDANSETRON HCL 4 MG/2ML IJ SOLN
INTRAMUSCULAR | Status: DC | PRN
  Administered 2018-05-18: 4 mg via INTRAVENOUS

## 2018-05-18 MED ORDER — SODIUM CHLORIDE 0.9 % IV SOLN
INTRAVENOUS | Status: DC
  Administered 2018-05-18: 13:00:00 via INTRAVENOUS

## 2018-05-18 MED ORDER — ONDANSETRON HCL 4 MG/2ML IJ SOLN
INTRAMUSCULAR | Status: AC
  Filled 2018-05-18: qty 2

## 2018-05-18 MED ORDER — MIDAZOLAM HCL 5 MG/5ML IJ SOLN
INTRAMUSCULAR | Status: DC | PRN
  Administered 2018-05-18 (×3): 1 mg via INTRAVENOUS
  Administered 2018-05-18: 2 mg via INTRAVENOUS

## 2018-05-18 NOTE — Op Note (Signed)
[facility] Patient Name: Jerry Burns Procedure Date: 05/18/2018 12:48 PM MRN: [id number] Date of Birth: [birth year] Attending MD: Norvel Richards , MD CSN: [id number] Age: 84 Admit Type: Outpatient Procedure:                Upper GI endoscopy Indications:              Anemia; Hemoccult positive stool Providers:                Norvel Richards, MD, Lurline Del, RN, Aram Candela Referring MD:              Medicines:                Meperidine 25 mg IV, Midazolam 3 mg IV, Ondansetron                            4 mg IVSL Complications:            No immediate complications. Estimated Blood Loss:     Estimated blood loss was minimal. Procedure:                Pre-Anesthesia Assessment:                           - Prior to the procedure, a History and Physical                            was performed, and patient medications and                            allergies were reviewed. The patient's tolerance of                            previous anesthesia was also reviewed. The risks                            and benefits of the procedure and the sedation                            options and risks were discussed with the patient.                            All questions were answered, and informed consent                            was obtained. Prior Anticoagulants: The patient has                            taken no previous anticoagulant or antiplatelet                            agents. ASA Grade Assessment: II - A patient with  mild systemic disease. After reviewing the risks                            and benefits, the patient was deemed in                            satisfactory condition to undergo the procedure.                           After obtaining informed consent, the endoscope was                            passed under direct vision. Throughout the                            procedure, the patient's  blood pressure, pulse, and                            oxygen saturations were monitored continuously. The                            [id number]-[id number] ([id number]) scope was introduced through the                            mouth, and advanced to the second part of duodenum.                            The upper GI endoscopy was accomplished without                            difficulty. The patient tolerated the procedure                            well. Scope In: 1:04:30 PM Scope Out: 1:10:06 PM Total Procedure Duration: 0 hours 5 minutes 36 seconds  Findings:      Circumferential distal esophageal erosions with areas of ulceration       within a centimeter of the GE junction. No Barrett's epithelium seen. A       small hiatal hernia was present.      Multiple dispersed hemorrhagic gastric erosions. No frank ulcer or       infiltrating process. Erosions extended down into the proximal second       portion of the duodenum. Biopsy of the abnormal gastric mucosa taken for       histologic study. Impression:               - Small hiatal hernia. Rather severe ulcerative                            reflux esophagitis                           - Erosive gastropathy. Status post biopsy. I  suspect patient has been bleeding from his upper GI                            tract. NSAID effect likely.                           - Moderate Sedation:      Moderate (conscious) sedation was administered by the endoscopy nurse       and supervised by the endoscopist. The following parameters were       monitored: oxygen saturation, heart rate, blood pressure, respiratory       rate, EKG, adequacy of pulmonary ventilation, and response to care.       Total physician intraservice time was 12 minutes. Recommendation:           - Patient has a contact number available for                            emergencies. The signs and symptoms of potential                            delayed complications  were discussed with the                            patient. Return to normal activities tomorrow.                            Written discharge instructions were provided to the                            patient.                           - Advance diet as tolerated. Limit NSAID use. Begin                            Protonix 40 mg daily. See colonoscopy report. Procedure Code(s):        --- Professional ---                           [phone], Esophagogastroduodenoscopy, flexible,                            transoral; diagnostic, including collection of                            specimen(s) by brushing or washing, when performed                            (separate procedure)                           G0500, Moderate sedation services provided by the                            same physician or other qualified health care  professional performing a gastrointestinal                            endoscopic service that sedation supports,                            requiring the presence of an independent trained                            observer to assist in the monitoring of the                            patient's level of consciousness and physiological                            status; initial 15 minutes of intra-service time;                            patient age 42 years or older (additional time may                            be reported with [phone], as appropriate) Diagnosis Code(s):        --- Professional ---                           K44.9, Diaphragmatic hernia without obstruction or                            gangrene                           K31.89, Other diseases of stomach and duodenum CPT copyright 2018 [employer]. All rights reserved. The codes documented in this report are preliminary and upon coder review may  be revised to meet current compliance requirements. Cristopher Estimable. Landy Dunnavant, MD Norvel Richards, MD 05/18/2018 1:45:03  PM This report has been signed electronically. Number of Addenda: 0

## 2018-05-18 NOTE — Discharge Instructions (Signed)
°Colonoscopy °Discharge Instructions ° °Read the instructions outlined below and refer to this sheet in the next few weeks. These discharge instructions provide you with general information on caring for yourself after you leave the hospital. Your doctor may also give you specific instructions. While your treatment has been planned according to the most current medical practices available, unavoidable complications occasionally occur. If you have any problems or questions after discharge, call Dr. Rourk at [phone]. °ACTIVITY °· You may resume your regular activity, but move at a slower pace for the next 24 hours.  °· Take frequent rest periods for the next 24 hours.  °· Walking will help get rid of the air and reduce the bloated feeling in your belly (abdomen).  °· No driving for 24 hours (because of the medicine (anesthesia) used during the test).   °· Do not sign any important legal documents or operate any machinery for 24 hours (because of the anesthesia used during the test).  °NUTRITION °· Drink plenty of fluids.  °· You may resume your normal diet as instructed by your doctor.  °· Begin with a light meal and progress to your normal diet. Heavy or fried foods are harder to digest and may make you feel sick to your stomach (nauseated).  °· Avoid alcoholic beverages for 24 hours or as instructed.  °MEDICATIONS °· You may resume your normal medications unless your doctor tells you otherwise.  °WHAT YOU CAN EXPECT TODAY °· Some feelings of bloating in the abdomen.  °· Passage of more gas than usual.  °· Spotting of blood in your stool or on the toilet paper.  °IF YOU HAD POLYPS REMOVED DURING THE COLONOSCOPY: °· No aspirin products for 7 days or as instructed.  °· No alcohol for 7 days or as instructed.  °· Eat a soft diet for the next 24 hours.  °FINDING OUT THE RESULTS OF YOUR TEST °Not all test results are available during your visit. If your test results are not back during the visit, make an appointment  with your caregiver to find out the results. Do not assume everything is normal if you have not heard from your caregiver or the medical facility. It is important for you to follow up on all of your test results.  °SEEK IMMEDIATE MEDICAL ATTENTION IF: °· You have more than a spotting of blood in your stool.  °· Your belly is swollen (abdominal distention).  °· You are nauseated or vomiting.  °· You have a temperature over 101.  °· You have abdominal pain or discomfort that is severe or gets worse throughout the day.  °EGD °Discharge instructions °Please read the instructions outlined below and refer to this sheet in the next few weeks. These discharge instructions provide you with general information on caring for yourself after you leave the hospital. Your doctor may also give you specific instructions. While your treatment has been planned according to the most current medical practices available, unavoidable complications occasionally occur. If you have any problems or questions after discharge, please call your doctor. °ACTIVITY °· You may resume your regular activity but move at a slower pace for the next 24 hours.  °· Take frequent rest periods for the next 24 hours.  °· Walking will help expel (get rid of) the air and reduce the bloated feeling in your abdomen.  °· No driving for 24 hours (because of the anesthesia (medicine) used during the test).  °· You may shower.  °· Do not sign any important   legal documents or operate any machinery for 24 hours (because of the anesthesia used during the test).  NUTRITION  Drink plenty of fluids.   You may resume your normal diet.   Begin with a light meal and progress to your normal diet.   Avoid alcoholic beverages for 24 hours or as instructed by your caregiver.  MEDICATIONS  You may resume your normal medications unless your caregiver tells you otherwise.  WHAT YOU CAN EXPECT TODAY  You may experience abdominal discomfort such as a feeling of fullness  or gas pains.  FOLLOW-UP  Your doctor will discuss the results of your test with you.  SEEK IMMEDIATE MEDICAL ATTENTION IF ANY OF THE FOLLOWING OCCUR:  Excessive nausea (feeling sick to your stomach) and/or vomiting.   Severe abdominal pain and distention (swelling).   Trouble swallowing.   Temperature over 101 F (37.8 C).   Rectal bleeding or vomiting of blood.   GERD information provided  Information on colon polyp and diverticulosis provided  Limit the use of nonsteroidal agents like Aleve and ibuprofen  Begin Protonix 40 mg daily  Further recommendations to follow pending review of pathology report  Office visit with [place] in 2 months   Diverticulosis Diverticulosis is a condition that develops when small pouches (diverticula) form in the wall of the large intestine (colon). The colon is where water is absorbed and stool is formed. The pouches form when the inside layer of the colon pushes through weak spots in the outer layers of the colon. You may have a few pouches or many of them. What are the causes? The cause of this condition is not known. What increases the risk? The following factors may make you more likely to develop this condition:  Being older than age 45. Your risk for this condition increases with age. Diverticulosis is rare among people younger than age 52. By age 57, many people have it.  Eating a low-fiber diet.  Having frequent constipation.  Being overweight.  Not getting enough exercise.  Smoking.  Taking over-the-counter pain medicines, like aspirin and ibuprofen.  Having a family history of diverticulosis.  What are the signs or symptoms? In most people, there are no symptoms of this condition. If you do have symptoms, they may include:  Bloating.  Cramps in the abdomen.  Constipation or diarrhea.  Pain in the lower left side of the abdomen.  How is this diagnosed? This condition is most often diagnosed during an exam for  other colon problems. Because diverticulosis usually has no symptoms, it often cannot be diagnosed independently. This condition may be diagnosed by:  Using a flexible scope to examine the colon (colonoscopy).  Taking an X-ray of the colon after dye has been put into the colon (barium enema).  Doing a CT scan.  How is this treated? You may not need treatment for this condition if you have never developed an infection related to diverticulosis. If you have had an infection before, treatment may include:  Eating a high-fiber diet. This may include eating more fruits, vegetables, and grains.  Taking a fiber supplement.  Taking a live bacteria supplement (probiotic).  Taking medicine to relax your colon.  Taking antibiotic medicines.  Follow these instructions at home:  Drink 6-8 glasses of water or more each day to prevent constipation.  Try not to strain when you have a bowel movement.  If you have had an infection before: ? Eat more fiber as directed by your health care provider or your  diet and nutrition specialist (dietitian). ? Take a fiber supplement or probiotic, if your health care provider approves.  Take over-the-counter and prescription medicines only as told by your health care provider.  If you were prescribed an antibiotic, take it as told by your health care provider. Do not stop taking the antibiotic even if you start to feel better.  Keep all follow-up visits as told by your health care provider. This is important. Contact a health care provider if:  You have pain in your abdomen.  You have bloating.  You have cramps.  You have not had a bowel movement in 3 days. Get help right away if:  Your pain gets worse.  Your bloating becomes very bad.  You have a fever or chills, and your symptoms suddenly get worse.  You vomit.  You have bowel movements that are bloody or black.  You have bleeding from your rectum. Summary  Diverticulosis is a  condition that develops when small pouches (diverticula) form in the wall of the large intestine (colon).  You may have a few pouches or many of them.  This condition is most often diagnosed during an exam for other colon problems.  If you have had an infection related to diverticulosis, treatment may include increasing the fiber in your diet, taking supplements, or taking medicines. This information is not intended to replace advice given to you by your health care provider. Make sure you discuss any questions you have with your health care provider. Document Released: 04/09/2004 Document Revised: 06/01/2016 Document Reviewed: 06/01/2016 Elsevier Interactive Patient Education  2017 [place].  Colon Polyps Polyps are tissue growths inside the body. Polyps can grow in many places, including the large intestine (colon). A polyp may be a round bump or a mushroom-shaped growth. You could have one polyp or several. Most colon polyps are noncancerous (benign). However, some colon polyps can become cancerous over time. What are the causes? The exact cause of colon polyps is not known. What increases the risk? This condition is more likely to develop in people who:  Have a family history of colon cancer or colon polyps.  Are older than 47 or older than 45 if they are African American.  Have inflammatory bowel disease, such as ulcerative colitis or Crohn disease.  Are overweight.  Smoke cigarettes.  Do not get enough exercise.  Drink too much alcohol.  Eat a diet that is: ? High in fat and red meat. ? Low in fiber.  Had childhood cancer that was treated with abdominal radiation.  What are the signs or symptoms? Most polyps do not cause symptoms. If you have symptoms, they may include:  Blood coming from your rectum when having a bowel movement.  Blood in your stool.The stool may look dark red or black.  A change in bowel habits, such as constipation or diarrhea.  How is  this diagnosed? This condition is diagnosed with a colonoscopy. This is a procedure that uses a lighted, flexible scope to look at the inside of your colon. How is this treated? Treatment for this condition involves removing any polyps that are found. Those polyps will then be tested for cancer. If cancer is found, your health care provider will talk to you about options for colon cancer treatment. Follow these instructions at home: Diet  Eat plenty of fiber, such as fruits, vegetables, and whole grains.  Eat foods that are high in calcium and vitamin D, such as milk, cheese, yogurt, eggs, liver, fish, and  broccoli.  Limit foods high in fat, red meats, and processed meats, such as hot dogs, sausage, bacon, and lunch meats.  Maintain a healthy weight, or lose weight if recommended by your health care provider. General instructions  Do not smoke cigarettes.  Do not drink alcohol excessively.  Keep all follow-up visits as told by your health care provider. This is important. This includes keeping regularly scheduled colonoscopies. Talk to your health care provider about when you need a colonoscopy.  Exercise every day or as told by your health care provider. Contact a health care provider if:  You have new or worsening bleeding during a bowel movement.  You have new or increased blood in your stool.  You have a change in bowel habits.  You unexpectedly lose weight. This information is not intended to replace advice given to you by your health care provider. Make sure you discuss any questions you have with your health care provider. Document Released: 04/08/2004 Document Revised: 12/19/2015 Document Reviewed: 06/03/2015 Elsevier Interactive Patient Education  2018 [place].  Gastroesophageal Reflux Disease, Adult Normally, food travels down the esophagus and stays in the stomach to be digested. However, when a person has gastroesophageal reflux disease (GERD), food and stomach  acid move back up into the esophagus. When this happens, the esophagus becomes sore and inflamed. Over time, GERD can create small holes (ulcers) in the lining of the esophagus. What are the causes? This condition is caused by a problem with the muscle between the esophagus and the stomach (lower esophageal sphincter, or LES). Normally, the LES muscle closes after food passes through the esophagus to the stomach. When the LES is weakened or abnormal, it does not close properly, and that allows food and stomach acid to go back up into the esophagus. The LES can be weakened by certain dietary substances, medicines, and medical conditions, including:  Tobacco use.  Pregnancy.  Having a hiatal hernia.  Heavy alcohol use.  Certain foods and beverages, such as coffee, chocolate, onions, and peppermint.  What increases the risk? This condition is more likely to develop in:  People who have an increased body weight.  People who have connective tissue disorders.  People who use NSAID medicines.  What are the signs or symptoms? Symptoms of this condition include:  Heartburn.  Difficult or painful swallowing.  The feeling of having a lump in the throat.  Abitter taste in the mouth.  Bad breath.  Having a large amount of saliva.  Having an upset or bloated stomach.  Belching.  Chest pain.  Shortness of breath or wheezing.  Ongoing (chronic) cough or a night-time cough.  Wearing away of tooth enamel.  Weight loss.  Different conditions can cause chest pain. Make sure to see your health care provider if you experience chest pain. How is this diagnosed? Your health care provider will take a medical history and perform a physical exam. To determine if you have mild or severe GERD, your health care provider may also monitor how you respond to treatment. You may also have other tests, including:  An endoscopy toexamine your stomach and esophagus with a small camera.  A test  thatmeasures the acidity level in your esophagus.  A test thatmeasures how much pressure is on your esophagus.  A barium swallow or modified barium swallow to show the shape, size, and functioning of your esophagus.  How is this treated? The goal of treatment is to help relieve your symptoms and to prevent complications. Treatment for  this condition may vary depending on how severe your symptoms are. Your health care provider may recommend:  Changes to your diet.  Medicine.  Surgery.  Follow these instructions at home: Diet  Follow a diet as recommended by your health care provider. This may involve avoiding foods and drinks such as: ? Coffee and tea (with or without caffeine). ? Drinks that containalcohol. ? Energy drinks and sports drinks. ? Carbonated drinks or sodas. ? Chocolate and cocoa. ? Peppermint and mint flavorings. ? Garlic and onions. ? Horseradish. ? Spicy and acidic foods, including peppers, chili powder, curry powder, vinegar, hot sauces, and barbecue sauce. ? Citrus fruit juices and citrus fruits, such as oranges, lemons, and limes. ? Tomato-based foods, such as red sauce, chili, salsa, and pizza with red sauce. ? Fried and fatty foods, such as donuts, french fries, potato chips, and high-fat dressings. ? High-fat meats, such as hot dogs and fatty cuts of red and white meats, such as rib eye steak, sausage, ham, and bacon. ? High-fat dairy items, such as whole milk, butter, and cream cheese.  Eat small, frequent meals instead of large meals.  Avoid drinking large amounts of liquid with your meals.  Avoid eating meals during the 2-3 hours before bedtime.  Avoid lying down right after you eat.  Do not exercise right after you eat. General instructions  Pay attention to any changes in your symptoms.  Take over-the-counter and prescription medicines only as told by your health care provider. Do not take aspirin, ibuprofen, or other NSAIDs unless your  health care provider told you to do so.  Do not use any tobacco products, including cigarettes, chewing tobacco, and e-cigarettes. If you need help quitting, ask your health care provider.  Wear loose-fitting clothing. Do not wear anything tight around your waist that causes pressure on your abdomen.  Raise (elevate) the head of your bed 6 inches (15cm).  Try to reduce your stress, such as with yoga or meditation. If you need help reducing stress, ask your health care provider.  If you are overweight, reduce your weight to an amount that is healthy for you. Ask your health care provider for guidance about a safe weight loss goal.  Keep all follow-up visits as told by your health care provider. This is important. Contact a health care provider if:  You have new symptoms.  You have unexplained weight loss.  You have difficulty swallowing, or it hurts to swallow.  You have wheezing or a persistent cough.  Your symptoms do not improve with treatment.  You have a hoarse voice. Get help right away if:  You have pain in your arms, neck, jaw, teeth, or back.  You feel sweaty, dizzy, or light-headed.  You have chest pain or shortness of breath.  You vomit and your vomit looks like blood or coffee grounds.  You faint.  Your stool is bloody or black.  You cannot swallow, drink, or eat. This information is not intended to replace advice given to you by your health care provider. Make sure you discuss any questions you have with your health care provider. Document Released: 04/22/2005 Document Revised: 12/11/2015 Document Reviewed: 11/07/2014 Elsevier Interactive Patient Education  [employer].

## 2018-05-18 NOTE — H&P (Signed)
'@LOGO' @   Primary Care Physician:  Kathyrn Drown, MD Primary Gastroenterologist:  Dr. Gala Romney  Pre-Procedure History & Physical: HPI:  Jerry Burns is a 84 y.o. male here for further evaluation of anemia and Hemoccult-positive stool via EGD and colonoscopy.  Past Medical History:  Diagnosis Date  . Anginal pain ([place])   . Arthritis    Bilateral knee  . Coronary artery disease   . Diabetes mellitus    insulin dependent   . Hyperlipidemia   . Hypertension   . Progressive angina ([place]) 03/2015   3.0x12 mm Resolute DES to pLAD, Angiosculpt scoring balloon to dLAD  . Stroke [facility])     Past Surgical History:  Procedure Laterality Date  . APPENDECTOMY    . CARDIAC CATHETERIZATION N/A 04/12/2015   Procedure: Left Heart Cath and Coronary Angiography;  Surgeon: Sherren Mocha, MD; pLAD 75>0% w/ 3.0x12 mm Resolute DES, dLAD 80>0% w/ Angiosculpt scoring balloon, CFX luminal irreg, RCA mild dz, EF 55-65%   . COLONOSCOPY  2011   RMR: 1. Normal rectum 2. Left sided diverticula , 2 ascending colon polyps, status post snare polypectomy. Remainder of colonic mucosa appeared unremarkable.   . COLONOSCOPY N/A 05/13/2016   Procedure: COLONOSCOPY;  Surgeon: Daneil Dolin, MD;  Location: [facility];  Service: Endoscopy;  Laterality: N/A;  7:30 AM  . CORONARY ANGIOGRAPHY N/A 02/04/2017   Procedure: CORONARY ANGIOGRAPHY (CATH LAB);  Surgeon: Troy Sine, MD;  Location: [place] CV LAB;  Service: Cardiovascular;  Laterality: N/A;  . CORONARY ANGIOPLASTY WITH STENT PLACEMENT  08/22/2014   PTCA/DES X mLAD with 2.5 x 16 mm Promus Premier DES by Dr Julianne Handler  . FOOT SURGERY Right   . LEFT HEART CATHETERIZATION WITH CORONARY ANGIOGRAM N/A 08/22/2014   Procedure: LEFT HEART CATHETERIZATION WITH CORONARY ANGIOGRAM;  Surgeon: Burnell Blanks, MD;  Location: [facility] CATH LAB;  Service: Cardiovascular;  Laterality: N/A;  . PERCUTANEOUS CORONARY STENT INTERVENTION (PCI-S)  08/22/2014   Procedure:  PERCUTANEOUS CORONARY STENT INTERVENTION (PCI-S);  Surgeon: Burnell Blanks, MD;   . SHOULDER SURGERY    . TONSILLECTOMY      Prior to Admission medications   Medication Sig Start Date End Date Taking? Authorizing Provider  amLODipine (NORVASC) 10 MG tablet TAKE (1) TABLET BY MOUTH ONCE DAILY. Patient taking differently: Take 10 mg by mouth daily.  04/22/18  Yes Kathyrn Drown, MD  aspirin EC 81 MG tablet Take 81 mg by mouth daily.   Yes [provider]  atorvastatin (LIPITOR) 80 MG tablet TAKE (1) TABLET BY MOUTH ONCE DAILY. Patient taking differently: Take 80 mg by mouth daily.  01/10/18  Yes Luking, Elayne Snare, MD  carvedilol (COREG) 12.5 MG tablet TAKE (1) TABLET BY MOUTH TWICE DAILY. 03/14/18  Yes Herminio Commons, MD  COMBIGAN 0.2-0.5 % ophthalmic solution Place 1 drop into the left eye 2 (two) times daily.  09/06/15  Yes [provider]  dutasteride (AVODART) 0.5 MG capsule TAKE ONE CAPSULE BY MOUTH ONCE DAILY. 11/29/17  Yes Luking, Elayne Snare, MD  Emollient (CERAVE) CREA Apply 1 application topically daily as needed (dry skin).   Yes [provider]  erythromycin ophthalmic ointment Place 1 application into the left eye 2 (two) times daily.    Yes [provider]  Flaxseed, Linseed, (FLAXSEED OIL) 1200 MG CAPS Take 1,200 mg by mouth daily.   Yes [provider]  Insulin Glargine (LANTUS SOLOSTAR) 100 UNIT/ML Solostar Pen INJECT 22 UNITS SUBCUTANEOUSLY ONCE DAILY  AT  10  PM Patient taking differently: Inject 17 Units into the skin at bedtime.  01/11/18  Yes Luking, Elayne Snare, MD  JANUVIA 100 MG tablet TAKE (1) TABLET BY MOUTH ONCE DAILY. Patient taking differently: Take 100 mg by mouth daily.  02/21/18  Yes Kathyrn Drown, MD  metFORMIN (GLUCOPHAGE) 1000 MG tablet TAKE (1) TABLET BY MOUTH TWICE A DAY WITH A MEAL. 01/11/18  Yes Kathyrn Drown, MD  Multiple Vitamin (MULTIVITAMIN) tablet Take 1 tablet by mouth daily.   Yes [provider]   Na Sulfate-K Sulfate-Mg Sulf 17.5-3.13-1.6 GM/177ML SOLN Take 1 kit by mouth as directed. 04/11/18  Yes Shriyan Arakawa, Cristopher Estimable, MD  Na Sulfate-K Sulfate-Mg Sulf 17.5-3.13-1.6 GM/177ML SOLN Take 1 kit by mouth as directed. 05/13/18  Yes Jhair Witherington, Cristopher Estimable, MD  Naproxen Sod-diphenhydrAMINE (ALEVE PM) 220-25 MG TABS Take 1 tablet by mouth at bedtime.   Yes [provider]  nitroGLYCERIN (NITROSTAT) 0.4 MG SL tablet PLACE 1 TAB UNDER TONGUE EVERY 5 MIN IF NEEDED FOR CHEST PAIN. MAY USE 3 TIMES.NO RELIEF CALL 911. Patient taking differently: Place 0.4 mg under the tongue every 5 (five) minutes as needed for chest pain.  04/22/18  Yes Herminio Commons, MD  Polyvinyl Alcohol-Povidone (REFRESH OP) Place 1 drop into the left eye 2 (two) times daily.   Yes [provider]  psyllium (METAMUCIL) 58.6 % powder Take 1 packet by mouth daily.    Yes [provider]  quinapril (ACCUPRIL) 10 MG tablet Take 1 tablet (10 mg total) by mouth daily. 03/14/18  Yes Herminio Commons, MD  tamsulosin (FLOMAX) 0.4 MG CAPS capsule TAKE (1) CAPSULE BY MOUTH ONCE DAILY. Patient taking differently: Take 0.4 mg by mouth daily.  04/22/18  Yes Luking, Elayne Snare, MD  Turmeric 500 MG CAPS Take 500 mg by mouth daily.   Yes [provider]  glucose blood (CONTOUR NEXT TEST) test strip USE ONE STRIP TO CHECK GLUCOSE THREE TIMES DAILY. DX E11.9 02/23/18   Kathyrn Drown, MD  PROAIR HFA 108 (90 Base) MCG/ACT inhaler INHALE 2 PUFFS INTO THE LUNGS EVERY 6 HOURS AS NEEDED FOR WHEEZING ORSHORTNESS OF BREATH. Patient taking differently: Inhale 2 puffs into the lungs every 6 (six) hours as needed for shortness of breath.  05/11/17   Mikey Kirschner, MD    Allergies as of 04/11/2018 - Review Complete 04/11/2018  Allergen Reaction Noted  . Sulfa antibiotics Other (See Comments) 06/04/2015  . Latex Other (See Comments) 06/29/2012    Family History  Problem Relation Age of Onset  . Diabetes Unknown   . Diabetes  Brother   . Colon cancer Maternal Uncle   . Skin cancer Mother   . Diabetes Father   . Hypertension Sister     Social History   Socioeconomic History  . Marital status: Married    Spouse name: Not on file  . Number of children: Not on file  . Years of education: 78  . Highest education level: Not on file  Occupational History  . Occupation: retired  Scientific laboratory technician  . Financial resource strain: Not on file  . Food insecurity:    Worry: Not on file    Inability: Not on file  . Transportation needs:    Medical: Not on file    Non-medical: Not on file  Tobacco Use  . Smoking status: Former Smoker    Packs/day: 1.00    Years: 20.00    Pack years: 20.00  Types: Cigarettes, Cigars    Start date: 08/10/1955    Last attempt to quit: 07/28/2007    Years since quitting: 10.8  . Smokeless tobacco: Never Used  . Tobacco comment: Quit x 15 years  Substance and Sexual Activity  . Alcohol use: Yes    Alcohol/week: 0.0 standard drinks    Comment: Daily about 1 glass of wine  . Drug use: No  . Sexual activity: Not Currently  Lifestyle  . Physical activity:    Days per week: Not on file    Minutes per session: Not on file  . Stress: Not on file  Relationships  . Social connections:    Talks on phone: Not on file    Gets together: Not on file    Attends religious service: Not on file    Active member of club or organization: Not on file    Attends meetings of clubs or organizations: Not on file    Relationship status: Not on file  . Intimate partner violence:    Fear of current or ex partner: Not on file    Emotionally abused: Not on file    Physically abused: Not on file    Forced sexual activity: Not on file  Other Topics Concern  . Not on file  Social History Narrative  . Not on file    Review of Systems: See HPI, otherwise negative ROS  Physical Exam: BP (!) 184/66   Pulse 72   Temp 97.8 F (36.6 C) (Oral)   Ht 6' (1.829 m)   Wt 95.3 kg   SpO2 100%   BMI  28.48 kg/m  General:   Alert,  pleasant and cooperative in NAD Neck:  Supple; no masses or thyromegaly. No significant cervical adenopathy. Lungs:  Clear throughout to auscultation.   No wheezes, crackles, or rhonchi. No acute distress. Heart:  Regular rate and rhythm; no murmurs, clicks, rubs,  or gallops. Abdomen: Non-distended, normal bowel sounds.  Soft and nontender without appreciable mass or hepatosplenomegaly.  Pulses:  Normal pulses noted. Extremities:  Without clubbing or edema.  Impression/Plan: Pleasant 84 year old gentleman with anemia and Hemoccult-positive stool.  Otherwise, essentially devoid of any lower GI tract symptoms.  History of colonic adenomas removed previously.  Recommendations: I have offered the patient both diagnostic EGD and colonoscopy today per plan.  The risks, benefits, limitations, imponderables and alternatives regarding both EGD and colonoscopy have been reviewed with the patient. Questions have been answered. All parties agreeable.        Notice: This dictation was prepared with Dragon dictation along with smaller phrase technology. Any transcriptional errors that result from this process are unintentional and may not be corrected upon review.

## 2018-05-18 NOTE — Op Note (Signed)
[facility] Patient Name: Jerry Burns Procedure Date: 05/18/2018 12:47 PM MRN: [id number] Date of Birth: [birth year] Attending MD: Norvel Richards , MD CSN: [id number] Age: 84 Admit Type: Outpatient Procedure:                Colonoscopy Indications:              Heme positive stool Providers:                Norvel Richards, MD, Lurline Del, RN, Aram Candela Referring MD:              Medicines:                Midazolam 5 mg IV, Meperidine 25 mg IV, Ondansetron                            4 mg IV Complications:            No immediate complications. Estimated Blood Loss:     Estimated blood loss: Minimal . Procedure:                Pre-Anesthesia Assessment:                           - Prior to the procedure, a History and Physical                            was performed, and patient medications and                            allergies were reviewed. The patient's tolerance of                            previous anesthesia was also reviewed. The risks                            and benefits of the procedure and the sedation                            options and risks were discussed with the patient.                            All questions were answered, and informed consent                            was obtained. Prior Anticoagulants: The patient has                            taken no previous anticoagulant or antiplatelet                            agents. ASA Grade Assessment: II - A patient with  mild systemic disease. After reviewing the risks                            and benefits, the patient was deemed in                            satisfactory condition to undergo the procedure.                           After obtaining informed consent, the colonoscope                            was passed under direct vision. Throughout the                            procedure, the patient's blood pressure, pulse, and                             oxygen saturations were monitored continuously. The                            CF-HQ190L ([id number]) scope was introduced through                            the anus and advanced to the the cecum, identified                            by appendiceal orifice and ileocecal valve. The                            colonoscopy was performed without difficulty. The                            patient tolerated the procedure well. The quality                            of the bowel preparation was adequate. Scope In: 1:15:48 PM Scope Out: 1:34:47 PM Scope Withdrawal Time: 0 hours 11 minutes 42 seconds  Total Procedure Duration: 0 hours 18 minutes 59 seconds  Findings:      The perianal and digital rectal examinations were normal.      Many small and large-mouthed diverticula were found in the sigmoid       colon, descending colon and transverse colon.      A 5 mm polyp was found in the hepatic flexure. The polyp was       semi-pedunculated. The polyp was removed with a cold snare. Resection       and retrieval were complete. Estimated blood loss was minimal.      The exam was otherwise without abnormality on direct and retroflexion       views. Impression:               - Diverticulosis in the sigmoid colon, in the  descending colon and in the transverse colon.                           - One 5 mm polyp at the hepatic flexure, removed                            with a cold snare. Resected and retrieved.                           - The examination was otherwise normal on direct                            and retroflexion views. Moderate Sedation:      Moderate (conscious) sedation was administered by the endoscopy nurse       and supervised by the endoscopist. The following parameters were       monitored: oxygen saturation, heart rate, blood pressure, respiratory       rate, EKG, adequacy of pulmonary ventilation, and response to care.        Total physician intraservice time was 36 minutes. Recommendation:           - Patient has a contact number available for                            emergencies. The signs and symptoms of potential                            delayed complications were discussed with the                            patient. Return to normal activities tomorrow.                            Written discharge instructions were provided to the                            patient.                           - Resume previous diet.                           - Continue present medications. See EGD report.                            Limit NSAID use. Begin Protonix 40 mg daily.                           - Repeat colonoscopy date to be determined after                            pending pathology results are reviewed for                            surveillance.                           -  Return to GI office in 2 months. Procedure Code(s):        --- Professional ---                           [phone], Colonoscopy, flexible; with removal of                            tumor(s), polyp(s), or other lesion(s) by snare                            technique                           99153, Moderate sedation; each additional 15                            minutes intraservice time                           G0500, Moderate sedation services provided by the                            same physician or other qualified health care                            professional performing a gastrointestinal                            endoscopic service that sedation supports,                            requiring the presence of an independent trained                            observer to assist in the monitoring of the                            patient's level of consciousness and physiological                            status; initial 15 minutes of intra-service time;                            patient age 81 years or older (additional time  may                            be reported with [phone], as appropriate) Diagnosis Code(s):        --- Professional ---                           D12.3, Benign neoplasm of transverse colon (hepatic                            flexure or splenic flexure)  R19.5, Other fecal abnormalities                           K57.30, Diverticulosis of large intestine without                            perforation or abscess without bleeding CPT copyright 2018 [employer]. All rights reserved. The codes documented in this report are preliminary and upon coder review may  be revised to meet current compliance requirements. Cristopher Estimable. Pranshu Lyster, MD Norvel Richards, MD 05/18/2018 1:48:50 PM This report has been signed electronically. Number of Addenda: 0

## 2018-05-19 ENCOUNTER — Ambulatory Visit (INDEPENDENT_AMBULATORY_CARE_PROVIDER_SITE_OTHER): Payer: Medicare Other

## 2018-05-19 DIAGNOSIS — Z23 Encounter for immunization: Secondary | ICD-10-CM

## 2018-05-22 ENCOUNTER — Encounter: Payer: Self-pay | Admitting: Internal Medicine

## 2018-05-24 ENCOUNTER — Encounter (HOSPITAL_COMMUNITY): Payer: Self-pay | Admitting: Internal Medicine

## 2018-05-26 DIAGNOSIS — I10 Essential (primary) hypertension: Secondary | ICD-10-CM | POA: Diagnosis not present

## 2018-05-26 DIAGNOSIS — Z794 Long term (current) use of insulin: Secondary | ICD-10-CM | POA: Diagnosis not present

## 2018-05-26 DIAGNOSIS — E1140 Type 2 diabetes mellitus with diabetic neuropathy, unspecified: Secondary | ICD-10-CM | POA: Diagnosis not present

## 2018-05-27 LAB — BASIC METABOLIC PANEL
BUN/Creatinine Ratio: 23 (ref 10–24)
BUN: 23 mg/dL (ref 8–27)
CHLORIDE: 101 mmol/L (ref 96–106)
CO2: 27 mmol/L (ref 20–29)
Calcium: 10 mg/dL (ref 8.6–10.2)
Creatinine, Ser: 0.99 mg/dL (ref 0.76–1.27)
GFR calc non Af Amer: 71 mL/min/{1.73_m2} (ref >59)
GFR, EST AFRICAN AMERICAN: 82 mL/min/{1.73_m2} (ref >59)
GLUCOSE: 131 mg/dL — AB (ref 65–99)
POTASSIUM: 5.3 mmol/L — AB (ref 3.5–5.2)
Sodium: 141 mmol/L (ref 134–144)

## 2018-05-27 LAB — HEMOGLOBIN A1C
ESTIMATED AVERAGE GLUCOSE: 151 mg/dL
HEMOGLOBIN A1C: 6.9 % — AB (ref 4.8–5.6)

## 2018-05-30 ENCOUNTER — Ambulatory Visit (INDEPENDENT_AMBULATORY_CARE_PROVIDER_SITE_OTHER): Payer: Medicare Other | Admitting: Family Medicine

## 2018-05-30 ENCOUNTER — Other Ambulatory Visit: Payer: Self-pay | Admitting: Family Medicine

## 2018-05-30 ENCOUNTER — Encounter: Payer: Self-pay | Admitting: Family Medicine

## 2018-05-30 VITALS — BP 126/84 | Ht 72.0 in | Wt 213.2 lb

## 2018-05-30 DIAGNOSIS — D649 Anemia, unspecified: Secondary | ICD-10-CM

## 2018-05-30 DIAGNOSIS — E875 Hyperkalemia: Secondary | ICD-10-CM | POA: Diagnosis not present

## 2018-05-30 DIAGNOSIS — I25118 Atherosclerotic heart disease of native coronary artery with other forms of angina pectoris: Secondary | ICD-10-CM | POA: Diagnosis not present

## 2018-05-30 DIAGNOSIS — E119 Type 2 diabetes mellitus without complications: Secondary | ICD-10-CM

## 2018-05-30 DIAGNOSIS — E782 Mixed hyperlipidemia: Secondary | ICD-10-CM

## 2018-05-30 NOTE — Progress Notes (Addendum)
   Subjective:    Patient ID: Jerry Burns, male    DOB: 1933/11/06, 84 y.o.   MRN: [id number]  Diabetes  He presents for his follow-up diabetic visit. He has type 2 diabetes mellitus. There are no hypoglycemic associated symptoms. Pertinent negatives for hypoglycemia include no confusion, dizziness or headaches. There are no diabetic associated symptoms. Pertinent negatives for diabetes include no chest pain and no fatigue. There are no hypoglycemic complications. There are no diabetic complications.   Pt here today for follow up. Pt states when he had colonoscopy his sugar went out of control.  This patient has significant diabetes He is on a varying amount of insulin on a daily basis typically around 22 units His sugars do fluctuate He does check his sugars between 2 and 4 times per day depending on his readings His A1c has been moderately out of control recently Face-to-face evaluation was completed today  Review of Systems  Constitutional: Negative for diaphoresis and fatigue.  HENT: Negative for congestion and rhinorrhea.   Respiratory: Negative for cough and shortness of breath.   Cardiovascular: Negative for chest pain and leg swelling.  Gastrointestinal: Negative for abdominal pain and diarrhea.  Skin: Negative for color change and rash.  Neurological: Negative for dizziness and headaches.  Psychiatric/Behavioral: Negative for behavioral problems and confusion.       Objective:   Physical Exam  Constitutional: He appears well-nourished. No distress.  HENT:  Head: Normocephalic and atraumatic.  Eyes: Right eye exhibits no discharge. Left eye exhibits no discharge.  Neck: No tracheal deviation present.  Cardiovascular: Normal rate, regular rhythm and normal heart sounds.  No murmur heard. Pulmonary/Chest: Effort normal and breath sounds normal. No respiratory distress.  Musculoskeletal: He exhibits no edema.  Lymphadenopathy:    He has no cervical adenopathy.    Neurological: He is alert. Coordination normal.  Skin: Skin is warm and dry.  Psychiatric: He has a normal mood and affect. His behavior is normal.  Vitals reviewed.         Assessment & Plan:  Hemoglobin gradually coming back up recheck it again in 3 months it was down because of GI blood loss colonoscopy came back looking good  Hyperlipidemia continue current measures watch diet closely cut back on starches  Diabetes decent control very important to watch diet minimize starches continue medication sugar readings look good Patient has to check his glucose readings 3-4 times daily because of fluctuating sugars, A1c 7.6 Patient is insulin-dependent Hyperkalemia minimal elevation recheck met 7 in 3 months follow-up office visit 4 months  Blood pressure good control currently

## 2018-07-11 ENCOUNTER — Other Ambulatory Visit: Payer: Self-pay | Admitting: Family Medicine

## 2018-07-13 ENCOUNTER — Telehealth: Payer: Self-pay | Admitting: Internal Medicine

## 2018-07-13 ENCOUNTER — Ambulatory Visit: Payer: Medicare Other | Admitting: Nurse Practitioner

## 2018-07-13 NOTE — Telephone Encounter (Signed)
Noted  

## 2018-07-13 NOTE — Telephone Encounter (Signed)
PATIENT HAD A 9:30AM APPOINTMENT AND AT 9:45 STATED THAT HE HAD OTHER PLACES HE HAD TO BE TODAY AND HE COULD NOT JUST WAIT HERE, HIS APPOINTMENT WAS AT 9:30." SAID HE WOULD JUST "CHECK WITH [place] LATER"

## 2018-07-13 NOTE — Progress Notes (Deleted)
Referring Provider: Kathyrn Drown, MD Primary Care Physician:  Kathyrn Drown, MD Primary GI:  Dr.   Rayne Du chief complaint on file.   HPI:   Jerry Burns is a 84 y.o. male who presents for follow-up on anemia and heme positive stool.  The patient was last seen in our office 04/11/2018 for the same.  He does have a history of colon polyps.  Previous colonoscopy in 2011 with tubular adenoma and recommended 5-year repeat but this had to be held due to cardiac stenting and Plavix.  Follow-up colonoscopy was completed on 05/13/2016 which found diverticular disease and a single colon polyp found to be tubular adenoma.  Recommended no future colonoscopy unless symptoms develop due to age.  Worsening anemia noted 04/04/2018 with hemoglobin 10.0 which is normocytic normochromic, ferritin normal at 125, iron low at 30, saturation low at 12%.  Noted history of mild anemia since 2016 but generally an 11.5-12.5 range until August 2019 when he declined to 10.7, 9.6, 10.0.  At his last visit he was doing okay overall, noted worsening anemia by primary care.  Stool was positive for blood.  Some increased fatigue over the previous 2 months but denies overt weakness.  No other GI symptoms.  Recommended colonoscopy and endoscopy, consider pill capsule endoscopy based on results and clinical progression, follow-up in 3 months.  EGD completed 05/18/2018 which found a hiatal hernia, rather severe ulcerative reflux esophagitis, erosive gastropathy status post biopsy and suspected bleeding from upper GI tract with likely NSAID effect.  Gastric biopsies were taken which was found to be chronic gastritis with H. pylori.  Recommended limit NSAIDs, start Protonix 40 mg daily.  Colonoscopy completed the same day found diverticulosis in the sigmoid, descending, transverse colons.  A single 5 mm polyp at the hepatic flexure, otherwise normal.  Surgical pathology found the polyp to be tubular adenoma.  Recommended no future  colonoscopy unless symptoms develop.  Was H. pylori treated???  Today he states   Past Medical History:  Diagnosis Date  . Anginal pain ([place])   . Arthritis    Bilateral knee  . Coronary artery disease   . Diabetes mellitus    insulin dependent   . Hyperlipidemia   . Hypertension   . Progressive angina ([place]) 03/2015   3.0x12 mm Resolute DES to pLAD, Angiosculpt scoring balloon to dLAD  . Stroke [facility])     Past Surgical History:  Procedure Laterality Date  . APPENDECTOMY    . CARDIAC CATHETERIZATION N/A 04/12/2015   Procedure: Left Heart Cath and Coronary Angiography;  Surgeon: Sherren Mocha, MD; pLAD 75>0% w/ 3.0x12 mm Resolute DES, dLAD 80>0% w/ Angiosculpt scoring balloon, CFX luminal irreg, RCA mild dz, EF 55-65%   . COLONOSCOPY  2011   RMR: 1. Normal rectum 2. Left sided diverticula , 2 ascending colon polyps, status post snare polypectomy. Remainder of colonic mucosa appeared unremarkable.   . COLONOSCOPY N/A 05/13/2016   Procedure: COLONOSCOPY;  Surgeon: Daneil Dolin, MD;  [facility];  Service: Endoscopy;  Laterality: N/A;  7:30 AM  . COLONOSCOPY N/A 05/18/2018   Procedure: COLONOSCOPY;  Surgeon: Daneil Dolin, MD;  Location: [facility];  Service: Endoscopy;  Laterality: N/A;  1:15pm  . CORONARY ANGIOGRAPHY N/A 02/04/2017   Procedure: CORONARY ANGIOGRAPHY (CATH LAB);  Surgeon: Troy Sine, MD;  Location: [facility] CV LAB;  Service: Cardiovascular;  Laterality: N/A;  . CORONARY ANGIOPLASTY WITH STENT PLACEMENT  08/22/2014   PTCA/DES  X mLAD with 2.5 x 16 mm Promus Premier DES by Dr Julianne Handler  . ESOPHAGOGASTRODUODENOSCOPY N/A 05/18/2018   Procedure: ESOPHAGOGASTRODUODENOSCOPY (EGD);  Surgeon: Daneil Dolin, MD;  Location: [facility];  Service: Endoscopy;  Laterality: N/A;  . FOOT SURGERY Right   . LEFT HEART CATHETERIZATION WITH CORONARY ANGIOGRAM N/A 08/22/2014   Procedure: LEFT HEART CATHETERIZATION WITH CORONARY ANGIOGRAM;  Surgeon: Burnell Blanks, MD;  Location: [facility] CATH LAB;  Service: Cardiovascular;  Laterality: N/A;  . PERCUTANEOUS CORONARY STENT INTERVENTION (PCI-S)  08/22/2014   Procedure: PERCUTANEOUS CORONARY STENT INTERVENTION (PCI-S);  Surgeon: Burnell Blanks, MD;   . SHOULDER SURGERY    . TONSILLECTOMY      Current Outpatient Medications  Medication Sig Dispense Refill  . amLODipine (NORVASC) 10 MG tablet TAKE (1) TABLET BY MOUTH ONCE DAILY. (Patient taking differently: Take 10 mg by mouth daily. ) 90 tablet 1  . aspirin EC 81 MG tablet Take 81 mg by mouth daily.    Marland Kitchen atorvastatin (LIPITOR) 80 MG tablet Take 1 tablet (80 mg total) by mouth daily. 90 tablet 0  . carvedilol (COREG) 12.5 MG tablet TAKE (1) TABLET BY MOUTH TWICE DAILY. 180 tablet 4  . COMBIGAN 0.2-0.5 % ophthalmic solution Place 1 drop into the left eye 2 (two) times daily.   2  . dutasteride (AVODART) 0.5 MG capsule TAKE ONE CAPSULE BY MOUTH ONCE DAILY. 90 capsule 1  . Emollient (CERAVE) CREA Apply 1 application topically daily as needed (dry skin).    Marland Kitchen erythromycin ophthalmic ointment Place 1 application into the left eye 2 (two) times daily.     . Flaxseed, Linseed, (FLAXSEED OIL) 1200 MG CAPS Take 1,200 mg by mouth daily.    Marland Kitchen glucose blood (CONTOUR NEXT TEST) test strip USE ONE STRIP TO CHECK GLUCOSE THREE TIMES DAILY. DX E11.9 100 each 11  . Insulin Glargine (LANTUS SOLOSTAR) 100 UNIT/ML Solostar Pen INJECT 22 UNITS SUBCUTANEOUSLY ONCE DAILY AT  10  PM (Patient taking differently: Inject 17 Units into the skin at bedtime. ) 15 mL 5  . JANUVIA 100 MG tablet TAKE (1) TABLET BY MOUTH ONCE DAILY. (Patient taking differently: Take 100 mg by mouth daily. ) 30 tablet 5  . metFORMIN (GLUCOPHAGE) 1000 MG tablet TAKE (1) TABLET BY MOUTH TWICE A DAY WITH A MEAL. 180 tablet 0  . Multiple Vitamin (MULTIVITAMIN) tablet Take 1 tablet by mouth daily.    . Na Sulfate-K Sulfate-Mg Sulf 17.5-3.13-1.6 GM/177ML SOLN Take 1 kit by mouth as directed. 1 Bottle 0    . Na Sulfate-K Sulfate-Mg Sulf 17.5-3.13-1.6 GM/177ML SOLN Take 1 kit by mouth as directed. 1 Bottle 0  . Naproxen Sod-diphenhydrAMINE (ALEVE PM) 220-25 MG TABS Take 1 tablet by mouth at bedtime.    . nitroGLYCERIN (NITROSTAT) 0.4 MG SL tablet PLACE 1 TAB UNDER TONGUE EVERY 5 MIN IF NEEDED FOR CHEST PAIN. MAY USE 3 TIMES.NO RELIEF CALL 911. (Patient taking differently: Place 0.4 mg under the tongue every 5 (five) minutes as needed for chest pain. ) 25 tablet 3  . Polyvinyl Alcohol-Povidone (REFRESH OP) Place 1 drop into the left eye 2 (two) times daily.    Marland Kitchen PROAIR HFA 108 (90 Base) MCG/ACT inhaler INHALE 2 PUFFS INTO THE LUNGS EVERY 6 HOURS AS NEEDED FOR WHEEZING ORSHORTNESS OF BREATH. (Patient taking differently: Inhale 2 puffs into the lungs every 6 (six) hours as needed for shortness of breath. ) 8.5 g 5  . psyllium (METAMUCIL) 58.6 % powder Take  1 packet by mouth daily.     . quinapril (ACCUPRIL) 10 MG tablet Take 1 tablet (10 mg total) by mouth daily. 90 tablet 4  . tamsulosin (FLOMAX) 0.4 MG CAPS capsule TAKE (1) CAPSULE BY MOUTH ONCE DAILY. (Patient taking differently: Take 0.4 mg by mouth daily. ) 90 capsule 1  . Turmeric 500 MG CAPS Take 500 mg by mouth daily.     No current facility-administered medications for this visit.     Allergies as of 07/13/2018 - Review Complete 05/30/2018  Allergen Reaction Noted  . Adhesive [tape]  05/11/2018  . Sulfa antibiotics Other (See Comments) 06/04/2015  . Latex Other (See Comments) 06/29/2012    Family History  Problem Relation Age of Onset  . Diabetes Unknown   . Diabetes Brother   . Colon cancer Maternal Uncle   . Skin cancer Mother   . Diabetes Father   . Hypertension Sister     Social History   Socioeconomic History  . Marital status: Married    Spouse name: Not on file  . Number of children: Not on file  . Years of education: 59  . Highest education level: Not on file  Occupational History  . Occupation: retired  Photographer  . Financial resource strain: Not on file  . Food insecurity:    Worry: Not on file    Inability: Not on file  . Transportation needs:    Medical: Not on file    Non-medical: Not on file  Tobacco Use  . Smoking status: Former Smoker    Packs/day: 1.00    Years: 20.00    Pack years: 20.00    Types: Cigarettes, Cigars    Start date: 08/10/1955    Last attempt to quit: 07/28/2007    Years since quitting: 10.9  . Smokeless tobacco: Never Used  . Tobacco comment: Quit x 15 years  Substance and Sexual Activity  . Alcohol use: Yes    Alcohol/week: 0.0 standard drinks    Comment: Daily about 1 glass of wine  . Drug use: No  . Sexual activity: Not Currently  Lifestyle  . Physical activity:    Days per week: Not on file    Minutes per session: Not on file  . Stress: Not on file  Relationships  . Social connections:    Talks on phone: Not on file    Gets together: Not on file    Attends religious service: Not on file    Active member of club or organization: Not on file    Attends meetings of clubs or organizations: Not on file    Relationship status: Not on file  Other Topics Concern  . Not on file  Social History Narrative  . Not on file    Review of Systems: General: Negative for anorexia, weight loss, fever, chills, fatigue, weakness. Eyes: Negative for vision changes.  ENT: Negative for hoarseness, difficulty swallowing , nasal congestion. CV: Negative for chest pain, angina, palpitations, dyspnea on exertion, peripheral edema.  Respiratory: Negative for dyspnea at rest, dyspnea on exertion, cough, sputum, wheezing.  GI: See history of present illness. GU:  Negative for dysuria, hematuria, urinary incontinence, urinary frequency, nocturnal urination.  MS: Negative for joint pain, low back pain.  Derm: Negative for rash or itching.  Neuro: Negative for weakness, abnormal sensation, seizure, frequent headaches, memory loss, confusion.  Psych: Negative for anxiety,  depression, suicidal ideation, hallucinations.  Endo: Negative for unusual weight change.  Heme: Negative for bruising  or bleeding. Allergy: Negative for rash or hives.   Physical Exam: There were no vitals taken for this visit. General:   Alert and oriented. Pleasant and cooperative. Well-nourished and well-developed.  Head:  Normocephalic and atraumatic. Eyes:  Without icterus, sclera clear and conjunctiva pink.  Ears:  Normal auditory acuity. Mouth:  No deformity or lesions, oral mucosa pink.  Throat/Neck:  Supple, without mass or thyromegaly. Cardiovascular:  S1, S2 present without murmurs appreciated. Normal pulses noted. Extremities without clubbing or edema. Respiratory:  Clear to auscultation bilaterally. No wheezes, rales, or rhonchi. No distress.  Gastrointestinal:  +BS, soft, non-tender and non-distended. No HSM noted. No guarding or rebound. No masses appreciated.  Rectal:  Deferred  Musculoskalatal:  Symmetrical without gross deformities. Normal posture. Skin:  Intact without significant lesions or rashes. Neurologic:  Alert and oriented x4;  grossly normal neurologically. Psych:  Alert and cooperative. Normal mood and affect. Heme/Lymph/Immune: No significant cervical adenopathy. No excessive bruising noted.    07/13/2018 7:58 AM   Disclaimer: This note was dictated with voice recognition software. Similar sounding words can inadvertently be transcribed and may not be corrected upon review.

## 2018-07-15 ENCOUNTER — Other Ambulatory Visit: Payer: Self-pay

## 2018-07-15 ENCOUNTER — Telehealth: Payer: Self-pay | Admitting: Family Medicine

## 2018-07-15 MED ORDER — MOMETASONE FUROATE 0.1 % EX CREA: TOPICAL_CREAM | CUTANEOUS | 3 refills | Status: DC

## 2018-07-15 NOTE — Telephone Encounter (Signed)
Medication sent in and patient is aware.  

## 2018-07-15 NOTE — Telephone Encounter (Signed)
Ok plus three ref 

## 2018-07-15 NOTE — Telephone Encounter (Signed)
Last prescribed 11/07/15. Please advise. Thank you

## 2018-07-15 NOTE — Telephone Encounter (Signed)
Pt requesting refill for Mometasone Furoate 0.1% cream   Pharmacy:  [place], [place]

## 2018-07-21 ENCOUNTER — Other Ambulatory Visit: Payer: Self-pay | Admitting: Family Medicine

## 2018-08-12 DIAGNOSIS — M25511 Pain in right shoulder: Secondary | ICD-10-CM | POA: Diagnosis not present

## 2018-08-22 ENCOUNTER — Other Ambulatory Visit: Payer: Self-pay | Admitting: Family Medicine

## 2018-08-22 NOTE — Telephone Encounter (Signed)
6 refills °

## 2018-08-23 ENCOUNTER — Other Ambulatory Visit: Payer: Self-pay | Admitting: Family Medicine

## 2018-08-26 ENCOUNTER — Other Ambulatory Visit: Payer: Self-pay | Admitting: Family Medicine

## 2018-09-02 ENCOUNTER — Other Ambulatory Visit (HOSPITAL_COMMUNITY)
Admission: RE | Admit: 2018-09-02 | Discharge: 2018-09-02 | Disposition: A | Discharge status: Home / Self Care | Payer: Medicare Other | Source: Ambulatory Visit | Attending: Family Medicine | Admitting: Family Medicine

## 2018-09-02 ENCOUNTER — Ambulatory Visit (HOSPITAL_COMMUNITY)
Admission: RE | Admit: 2018-09-02 | Discharge: 2018-09-02 | Disposition: A | Discharge status: Home / Self Care | Payer: Medicare Other | Source: Ambulatory Visit | Attending: Family Medicine | Admitting: Family Medicine

## 2018-09-02 ENCOUNTER — Other Ambulatory Visit: Payer: Self-pay

## 2018-09-02 ENCOUNTER — Encounter (HOSPITAL_COMMUNITY): Payer: Self-pay | Admitting: Emergency Medicine

## 2018-09-02 ENCOUNTER — Inpatient Hospital Stay (HOSPITAL_COMMUNITY)
Admission: EM | Admit: 2018-09-02 | Discharge: 2018-09-05 | DRG: 291 | Disposition: A | Discharge status: Home / Self Care | Payer: Medicare Other | Source: Ambulatory Visit | Attending: Family Medicine | Admitting: Family Medicine

## 2018-09-02 ENCOUNTER — Ambulatory Visit (INDEPENDENT_AMBULATORY_CARE_PROVIDER_SITE_OTHER): Payer: Medicare Other | Admitting: Family Medicine

## 2018-09-02 ENCOUNTER — Other Ambulatory Visit: Payer: Self-pay | Admitting: *Deleted

## 2018-09-02 ENCOUNTER — Encounter: Payer: Self-pay | Admitting: Family Medicine

## 2018-09-02 VITALS — BP 142/68 | HR 68 | Temp 98.1°F | Ht 72.0 in | Wt 211.0 lb

## 2018-09-02 DIAGNOSIS — D649 Anemia, unspecified: Secondary | ICD-10-CM | POA: Insufficient documentation

## 2018-09-02 DIAGNOSIS — R0609 Other forms of dyspnea: Secondary | ICD-10-CM

## 2018-09-02 DIAGNOSIS — I110 Hypertensive heart disease with heart failure: Secondary | ICD-10-CM | POA: Diagnosis present

## 2018-09-02 DIAGNOSIS — I5031 Acute diastolic (congestive) heart failure: Secondary | ICD-10-CM | POA: Diagnosis present

## 2018-09-02 DIAGNOSIS — R600 Localized edema: Secondary | ICD-10-CM | POA: Diagnosis not present

## 2018-09-02 DIAGNOSIS — Z833 Family history of diabetes mellitus: Secondary | ICD-10-CM

## 2018-09-02 DIAGNOSIS — R7989 Other specified abnormal findings of blood chemistry: Secondary | ICD-10-CM

## 2018-09-02 DIAGNOSIS — R05 Cough: Secondary | ICD-10-CM

## 2018-09-02 DIAGNOSIS — J90 Pleural effusion, not elsewhere classified: Secondary | ICD-10-CM | POA: Diagnosis not present

## 2018-09-02 DIAGNOSIS — R0602 Shortness of breath: Secondary | ICD-10-CM

## 2018-09-02 DIAGNOSIS — N179 Acute kidney failure, unspecified: Secondary | ICD-10-CM | POA: Diagnosis not present

## 2018-09-02 DIAGNOSIS — I2699 Other pulmonary embolism without acute cor pulmonale: Secondary | ICD-10-CM | POA: Diagnosis present

## 2018-09-02 DIAGNOSIS — T501X5A Adverse effect of loop [high-ceiling] diuretics, initial encounter: Secondary | ICD-10-CM | POA: Diagnosis not present

## 2018-09-02 DIAGNOSIS — R609 Edema, unspecified: Secondary | ICD-10-CM

## 2018-09-02 DIAGNOSIS — D509 Iron deficiency anemia, unspecified: Secondary | ICD-10-CM

## 2018-09-02 DIAGNOSIS — R0603 Acute respiratory distress: Secondary | ICD-10-CM | POA: Diagnosis present

## 2018-09-02 DIAGNOSIS — I82441 Acute embolism and thrombosis of right tibial vein: Secondary | ICD-10-CM | POA: Diagnosis present

## 2018-09-02 DIAGNOSIS — Z87891 Personal history of nicotine dependence: Secondary | ICD-10-CM

## 2018-09-02 DIAGNOSIS — E119 Type 2 diabetes mellitus without complications: Secondary | ICD-10-CM | POA: Diagnosis present

## 2018-09-02 DIAGNOSIS — Z7982 Long term (current) use of aspirin: Secondary | ICD-10-CM

## 2018-09-02 DIAGNOSIS — R059 Cough, unspecified: Secondary | ICD-10-CM

## 2018-09-02 DIAGNOSIS — E785 Hyperlipidemia, unspecified: Secondary | ICD-10-CM | POA: Diagnosis present

## 2018-09-02 DIAGNOSIS — J069 Acute upper respiratory infection, unspecified: Secondary | ICD-10-CM | POA: Diagnosis present

## 2018-09-02 DIAGNOSIS — Z955 Presence of coronary angioplasty implant and graft: Secondary | ICD-10-CM

## 2018-09-02 DIAGNOSIS — Z794 Long term (current) use of insulin: Secondary | ICD-10-CM

## 2018-09-02 DIAGNOSIS — I2510 Atherosclerotic heart disease of native coronary artery without angina pectoris: Secondary | ICD-10-CM | POA: Diagnosis present

## 2018-09-02 DIAGNOSIS — M7989 Other specified soft tissue disorders: Secondary | ICD-10-CM

## 2018-09-02 LAB — IRON AND TIBC
Iron: 28 ug/dL — ABNORMAL LOW (ref 45–182)
Saturation Ratios: 9 % — ABNORMAL LOW (ref 17.9–39.5)
TIBC: 303 ug/dL (ref 250–450)
UIBC: 275 ug/dL

## 2018-09-02 LAB — CBC WITH DIFFERENTIAL/PLATELET
Abs Immature Granulocytes: 0.02 10*3/uL (ref 0.00–0.07)
Basophils Absolute: 0.1 10*3/uL (ref 0.0–0.1)
Basophils Relative: 1 %
Eosinophils Absolute: 0.8 10*3/uL — ABNORMAL HIGH (ref 0.0–0.5)
Eosinophils Relative: 10 %
HCT: 35.2 % — ABNORMAL LOW (ref 39.0–52.0)
Hemoglobin: 11 g/dL — ABNORMAL LOW (ref 13.0–17.0)
Immature Granulocytes: 0 %
Lymphocytes Relative: 13 %
Lymphs Abs: 1.1 10*3/uL (ref 0.7–4.0)
MCH: 26.7 pg (ref 26.0–34.0)
MCHC: 31.3 g/dL (ref 30.0–36.0)
MCV: 85.4 fL (ref 80.0–100.0)
Monocytes Absolute: 0.8 10*3/uL (ref 0.1–1.0)
Monocytes Relative: 9 %
NRBC: 0 % (ref 0.0–0.2)
Neutro Abs: 5.7 10*3/uL (ref 1.7–7.7)
Neutrophils Relative %: 67 %
Platelets: 315 10*3/uL (ref 150–400)
RBC: 4.12 MIL/uL — ABNORMAL LOW (ref 4.22–5.81)
RDW: 13.6 % (ref 11.5–15.5)
WBC: 8.5 10*3/uL (ref 4.0–10.5)

## 2018-09-02 LAB — RETICULOCYTES
IMMATURE RETIC FRACT: 11.4 % (ref 2.3–15.9)
RBC.: 4.12 MIL/uL — AB (ref 4.22–5.81)
Retic Count, Absolute: 38.3 10*3/uL (ref 19.0–186.0)
Retic Ct Pct: 0.9 % (ref 0.4–3.1)

## 2018-09-02 LAB — HEPATIC FUNCTION PANEL
ALT: 15 U/L (ref 0–44)
AST: 18 U/L (ref 15–41)
Albumin: 3.8 g/dL (ref 3.5–5.0)
Alkaline Phosphatase: 99 U/L (ref 38–126)
BILIRUBIN TOTAL: 0.5 mg/dL (ref 0.3–1.2)
Bilirubin, Direct: 0.1 mg/dL (ref 0.0–0.2)
Indirect Bilirubin: 0.4 mg/dL (ref 0.3–0.9)
Total Protein: 7.7 g/dL (ref 6.5–8.1)

## 2018-09-02 LAB — BASIC METABOLIC PANEL
Anion gap: 11 (ref 5–15)
BUN: 24 mg/dL — ABNORMAL HIGH (ref 8–23)
CO2: 25 mmol/L (ref 22–32)
Calcium: 9.5 mg/dL (ref 8.9–10.3)
Chloride: 101 mmol/L (ref 98–111)
Creatinine, Ser: 0.92 mg/dL (ref 0.61–1.24)
GFR calc Af Amer: >60 mL/min (ref >60)
GFR calc non Af Amer: >60 mL/min (ref >60)
Glucose, Bld: 137 mg/dL — ABNORMAL HIGH (ref 70–99)
Potassium: 4.8 mmol/L (ref 3.5–5.1)
Sodium: 137 mmol/L (ref 135–145)

## 2018-09-02 LAB — FERRITIN: Ferritin: 75 ng/mL (ref 24–336)

## 2018-09-02 LAB — POCT HEMOGLOBIN: HEMOGLOBIN: 7.7 g/dL — AB (ref 11–14.6)

## 2018-09-02 MED ORDER — FUROSEMIDE 10 MG/ML IJ SOLN
40.0000 mg | Freq: Once | INTRAMUSCULAR | Status: AC
  Administered 2018-09-02: 40 mg via INTRAVENOUS
  Filled 2018-09-02: qty 4

## 2018-09-02 NOTE — ED Triage Notes (Signed)
Pt reports increased SOB over past two weeks. Pt seen by Dr. Wolfgang Phoenix this am. Pt had xray today and blood work. Pt states increased in SOB today. Pt was told he would need it "tapped by next week"

## 2018-09-02 NOTE — Progress Notes (Signed)
   Subjective:    Patient ID: Jerry Burns, male    DOB: 1933/11/24, 85 y.o.   MRN: [id number]  HPI Patient is here today with complaints of difficulties of breathing.  He has a problem while lying down especially and upon exertion. Had some swelling in left leg and ankle a month ago,but it has went down.This started two weeks ago.  He has been using his inhaler and it is not helping. Patient has a history.  Fingerstick hemoglobin office registered very low concerning for the possibility denies any chest tightness pressure He is not taking a fluid pill. He also reports of being colder as of late. And belching a lot. Had an endoscopy.  He does have a history of reflux and says he was put on reflux medication for the last two months. Results for orders placed or performed in visit on 09/02/18  POCT hemoglobin  Result Value Ref Range   Hemoglobin 7.7 (A) 11 - 14.6 g/dL     Review of Systems  Constitutional: Negative for activity change, fatigue and fever.  HENT: Negative for congestion and rhinorrhea.   Respiratory: Positive for shortness of breath. Negative for cough.   Cardiovascular: Negative for chest pain and leg swelling.  Gastrointestinal: Negative for abdominal pain, diarrhea and nausea.  Genitourinary: Negative for dysuria and hematuria.  Neurological: Negative for weakness and headaches.  Psychiatric/Behavioral: Negative for agitation and behavioral problems.       Objective:   Physical Exam Vitals signs reviewed.  Constitutional:      General: He is not in acute distress. HENT:     Head: Normocephalic and atraumatic.  Eyes:     General:        Right eye: No discharge.        Left eye: No discharge.  Neck:     Trachea: No tracheal deviation.  Cardiovascular:     Rate and Rhythm: Normal rate and regular rhythm.     Heart sounds: Normal heart sounds. No murmur.  Pulmonary:     Effort: Pulmonary effort is normal. No respiratory distress.     Breath sounds:  Normal breath sounds.  Lymphadenopathy:     Cervical: No cervical adenopathy.  Skin:    General: Skin is warm and dry.  Neurological:     Mental Status: He is alert.     Coordination: Coordination normal.  Psychiatric:        Behavior: Behavior normal.          25 minutes was spent with the patient.  This statement verifies that 25 minutes was indeed spent with the patient.  More than 50% of this visit-total duration of the visit-was spent in counseling and coordination of care. The issues that the patient came in for today as reflected in the diagnosis (s) please refer to documentation for further details.  Assessment & Plan:  Given his low hemoglobin we will further chest x-ray Await the results of these tests May need further testing -CBC came back significant so it is not felt that is contributing to does have bilateral pleural effusions that will need further work-up I spoke with the patient O2 saturation sent patient was told that if his worsening to go to emergency department otherwise Monday will be pursuing treatment fusions.

## 2018-09-02 NOTE — ED Provider Notes (Signed)
[facility] EMERGENCY DEPARTMENT Provider Note   CSN: [id number] Arrival date & time: 09/02/18  2130     History   Chief Complaint Chief Complaint  Patient presents with  . Shortness of Breath    HPI Jerry Burns is a 85 y.o. male.  The history is provided by the patient.  He has history of hypertension, diabetes, hyperlipidemia, coronary artery disease, stroke, anemia and comes in with progressive dyspnea over the last several months.  Over the last 2 weeks, the dyspnea has been getting worse.  This is been associated with generalized weakness.  Over the last 2 days, he has been unable to sleep because he gets too dyspneic when he lays flat and he has had to sit up all night.  He denies chest pain, heaviness, tightness, pressure.  He denies nausea and vomiting.  He has noted some swelling of his left leg but none of his right leg.  He denies any recent surgery or travel.  He went to his physician today and had a fingerstick hemoglobin which was read as 7.7 and had additional blood work done which actually showed his hemoglobin at 11.  He came in tonight because he is unable to sleep because of dyspnea.  Past Medical History:  Diagnosis Date  . Anginal pain ([place])   . Arthritis    Bilateral knee  . Coronary artery disease   . Diabetes mellitus    insulin dependent   . Hyperlipidemia   . Hypertension   . Progressive angina ([place]) 03/2015   3.0x12 mm Resolute DES to pLAD, Angiosculpt scoring balloon to dLAD  . Stroke [employer])     Patient Active Problem List   Diagnosis Date Noted  . Anemia 04/11/2018  . Heme positive stool 04/11/2018  . Long term current use of antithrombotics/antiplatelets 02/06/2016  . Exertional angina ([place]) 04/12/2015  . History of colonic polyps 11/21/2014  . ASCVD (arteriosclerotic cardiovascular disease) 09/03/2014  . Unstable angina ([place]) 08/22/2014  . Type 2 diabetes mellitus with diabetic neuropathy, with long-term current use of insulin ([place])  08/08/2014  . Chest pain on exertion 07/23/2014  . Rotator cuff syndrome of right shoulder 12/16/2011  . Shoulder pain, right 12/16/2011  . HYPERKALEMIA 01/31/2009  . KNEE PAIN, LEFT 07/31/2008  . DIABETIC FOOT ULCER, TOE 12/23/2007  . BPH (benign prostatic hyperplasia) 01/26/2007  . URINARY TRACT INFECTION 01/12/2007  . URINARY RETENTION 01/12/2007  . ERECTILE DYSFUNCTION 11/01/2006  . Hyperlipidemia 08/04/2006  . Essential hypertension 08/04/2006  . OSTEOARTHRITIS 08/04/2006    Past Surgical History:  Procedure Laterality Date  . APPENDECTOMY    . CARDIAC CATHETERIZATION N/A 04/12/2015   Procedure: Left Heart Cath and Coronary Angiography;  Surgeon: Sherren Mocha, MD; pLAD 75>0% w/ 3.0x12 mm Resolute DES, dLAD 80>0% w/ Angiosculpt scoring balloon, CFX luminal irreg, RCA mild dz, EF 55-65%   . COLONOSCOPY  2011   RMR: 1. Normal rectum 2. Left sided diverticula , 2 ascending colon polyps, status post snare polypectomy. Remainder of colonic mucosa appeared unremarkable.   . COLONOSCOPY N/A 05/13/2016   Procedure: COLONOSCOPY;  Surgeon: Daneil Dolin, MD;  Location[facility];  Service: Endoscopy;  Laterality: N/A;  7:30 AM  . COLONOSCOPY N/A 05/18/2018   Procedure: COLONOSCOPY;  Surgeon: Daneil Dolin, MD;  Location: [facility];  Service: Endoscopy;  Laterality: N/A;  1:15pm  . CORONARY ANGIOGRAPHY N/A 02/04/2017   Procedure: CORONARY ANGIOGRAPHY (CATH LAB);  Surgeon: Troy Sine, MD;  Location: [facility]  CV LAB;  Service: Cardiovascular;  Laterality: N/A;  . CORONARY ANGIOPLASTY WITH STENT PLACEMENT  08/22/2014   PTCA/DES X mLAD with 2.5 x 16 mm Promus Premier DES by Dr Julianne Handler  . ESOPHAGOGASTRODUODENOSCOPY N/A 05/18/2018   Procedure: ESOPHAGOGASTRODUODENOSCOPY (EGD);  Surgeon: Daneil Dolin, MD;  Location: [facility];  Service: Endoscopy;  Laterality: N/A;  . FOOT SURGERY Right   . LEFT HEART CATHETERIZATION WITH CORONARY ANGIOGRAM N/A 08/22/2014   Procedure:  LEFT HEART CATHETERIZATION WITH CORONARY ANGIOGRAM;  Surgeon: Burnell Blanks, MD;  Location: [facility] CATH LAB;  Service: Cardiovascular;  Laterality: N/A;  . PERCUTANEOUS CORONARY STENT INTERVENTION (PCI-S)  08/22/2014   Procedure: PERCUTANEOUS CORONARY STENT INTERVENTION (PCI-S);  Surgeon: Burnell Blanks, MD;   . SHOULDER SURGERY    . TONSILLECTOMY          Home Medications    Prior to Admission medications   Medication Sig Start Date End Date Taking? Authorizing Provider  amLODipine (NORVASC) 10 MG tablet TAKE (1) TABLET BY MOUTH ONCE DAILY. Patient taking differently: Take 10 mg by mouth daily.  04/22/18   Kathyrn Drown, MD  aspirin EC 81 MG tablet Take 81 mg by mouth daily.    [provider]  atorvastatin (LIPITOR) 80 MG tablet Take 1 tablet (80 mg total) by mouth daily. 07/11/18   Kathyrn Drown, MD  carvedilol (COREG) 12.5 MG tablet TAKE (1) TABLET BY MOUTH TWICE DAILY. 03/14/18   Herminio Commons, MD  COMBIGAN 0.2-0.5 % ophthalmic solution Place 1 drop into the left eye 2 (two) times daily.  09/06/15   [provider]  dutasteride (AVODART) 0.5 MG capsule TAKE ONE CAPSULE BY MOUTH ONCE DAILY. 08/22/18   Kathyrn Drown, MD  Emollient (CERAVE) CREA Apply 1 application topically daily as needed (dry skin).    [provider]  erythromycin ophthalmic ointment Place 1 application into the left eye 2 (two) times daily.     [provider]  Flaxseed, Linseed, (FLAXSEED OIL) 1200 MG CAPS Take 1,200 mg by mouth daily.    [provider]  glucose blood (CONTOUR NEXT TEST) test strip USE ONE STRIP TO CHECK GLUCOSE THREE TIMES DAILY. DX E11.9 02/23/18   Kathyrn Drown, MD  Insulin Glargine (LANTUS SOLOSTAR) 100 UNIT/ML Solostar Pen INJECT 22 UNITS SUBCUTANEOUSLY ONCE DAILY AT  10  PM Patient taking differently: Inject 17 Units into the skin at bedtime.  01/11/18   Kathyrn Drown, MD  metFORMIN (GLUCOPHAGE) 1000 MG tablet TAKE (1)  TABLET BY MOUTH TWICE A DAY WITH A MEAL. 05/30/18   Kathyrn Drown, MD  mometasone (ELOCON) 0.1 % cream Apply to affected area daily 07/15/18 07/15/19  Mikey Kirschner, MD  Multiple Vitamin (MULTIVITAMIN) tablet Take 1 tablet by mouth daily.    [provider]  Na Sulfate-K Sulfate-Mg Sulf 17.5-3.13-1.6 GM/177ML SOLN Take 1 kit by mouth as directed. 04/11/18   Rourk, Cristopher Estimable, MD  Na Sulfate-K Sulfate-Mg Sulf 17.5-3.13-1.6 GM/177ML SOLN Take 1 kit by mouth as directed. 05/13/18   Rourk, Cristopher Estimable, MD  Naproxen Sod-diphenhydrAMINE (ALEVE PM) 220-25 MG TABS Take 1 tablet by mouth at bedtime.    [provider]  nitroGLYCERIN (NITROSTAT) 0.4 MG SL tablet PLACE 1 TAB UNDER TONGUE EVERY 5 MIN IF NEEDED FOR CHEST PAIN. MAY USE 3 TIMES.NO RELIEF CALL 911. Patient taking differently: Place 0.4 mg under the tongue every 5 (five) minutes as needed for chest pain.  04/22/18   Bronson Ing,  Lenise Herald, MD  Polyvinyl Alcohol-Povidone (REFRESH OP) Place 1 drop into the left eye 2 (two) times daily.    [provider]  PROAIR HFA 108 (90 Base) MCG/ACT inhaler INHALE 2 PUFFS INTO THE LUNGS EVERY 6 HOURS AS NEEDED FOR WHEEZING ORSHORTNESS OF BREATH. 08/26/18   Kathyrn Drown, MD  psyllium (METAMUCIL) 58.6 % powder Take 1 packet by mouth daily.     [provider]  quinapril (ACCUPRIL) 10 MG tablet Take 1 tablet (10 mg total) by mouth daily. 03/14/18   Herminio Commons, MD  sitaGLIPtin (JANUVIA) 100 MG tablet Take 1 tablet (100 mg total) by mouth daily. 07/21/18   Kathyrn Drown, MD  tamsulosin (FLOMAX) 0.4 MG CAPS capsule Take 1 capsule (0.4 mg total) by mouth daily. 07/21/18   Kathyrn Drown, MD  Turmeric 500 MG CAPS Take 500 mg by mouth daily.    [provider]    Family History Family History  Problem Relation Age of Onset  . Diabetes Other   . Diabetes Brother   . Colon cancer Maternal Uncle   . Skin cancer Mother   . Diabetes Father   . Hypertension Sister       Social History Social History   Tobacco Use  . Smoking status: Former Smoker    Packs/day: 1.00    Years: 20.00    Pack years: 20.00    Types: Cigarettes, Cigars    Start date: 08/10/1955    Last attempt to quit: 07/28/2007    Years since quitting: 11.1  . Smokeless tobacco: Never Used  . Tobacco comment: Quit x 15 years  Substance Use Topics  . Alcohol use: Yes    Alcohol/week: 0.0 standard drinks    Comment: Daily about 1 glass of wine  . Drug use: No     Allergies   Adhesive [tape]; Sulfa antibiotics; and Latex   Review of Systems Review of Systems  All other systems reviewed and are negative.    Physical Exam Updated Vital Signs BP (!) 158/66 (BP Location: Right Arm)   Pulse 79   Temp 97.7 F (36.5 C) (Oral)   Resp (!) 24   Wt 97 kg   SpO2 96%   BMI 29.00 kg/m   Physical Exam Vitals signs and nursing note reviewed.    85 year old male, resting comfortably and in no acute distress. Vital signs are significant for elevated systolic blood pressure and rapid respiratory rate. Oxygen saturation is 96%, which is normal. Head is normocephalic and atraumatic. PERRLA, EOMI. Oropharynx is clear. Neck is nontender and supple without adenopathy or JVD. Back is nontender and there is no CVA tenderness.  There is no presacral edema. Lungs have decreased breath sounds at the left base.  There are bibasilar rales which are louder on the right than on the left.  There are no wheezes or rhonchi. Chest is nontender. Heart has regular rate and rhythm without murmur. Abdomen is soft, flat, nontender without masses or hepatosplenomegaly and peristalsis is normoactive. Extremities have 1+ edema on the right and 2+ edema on the left.  Left calf circumference is 3 cm greater than right calf circumference.  There is mild tenderness to palpation of the left calf, but Homans sign is negative.  There is no erythema or warmth and there are no cords palpable. Skin is warm and dry  without rash. Neurologic: Mental status is normal, cranial nerves are intact, there are no motor or sensory deficits.  ED  Treatments / Results  Labs (all labs ordered are listed, but only abnormal results are displayed) Labs Reviewed  D-DIMER, QUANTITATIVE (NOT AT [facility]) - Abnormal; Notable for the following components:      Result Value   D-Dimer, Quant 4.68 (*)    All other components within normal limits  URINALYSIS, ROUTINE W REFLEX MICROSCOPIC - Abnormal; Notable for the following components:   Protein, ur 100 (*)    Bacteria, UA RARE (*)    All other components within normal limits  BRAIN NATRIURETIC PEPTIDE  TROPONIN I  PROTIME-INR  APTT    EKG EKG Interpretation  Date/Time:  Friday September 02 2018 23:44:12 EST Ventricular Rate:  80 PR Interval:    QRS Duration: 117 QT Interval:  379 QTC Calculation: 438 R Axis:   -45 Text Interpretation:  Sinus rhythm Incomplete left bundle branch block Low voltage, precordial leads Consider anterior infarct Left anterior fasicular block When compared with ECG of 04/13/2015, No significant change was found Confirmed by Delora Fuel ([id number]) on 09/02/2018 11:55:07 PM   Radiology Dg Chest 2 View  Result Date: 09/02/2018 CLINICAL DATA:  Shortness of breath and cough EXAM: CHEST - 2 VIEW COMPARISON:  02/02/2017 FINDINGS: Cardiac shadow is stable. The lungs are well aerated bilaterally. Bilateral pleural effusions are noted left greater than right. Degenerative changes of the thoracic spine are noted. IMPRESSION: Bilateral pleural effusions left greater than right. Electronically Signed   By: Inez Catalina M.D.   On: 09/02/2018 12:20    Procedures Procedures  Medications Ordered in ED Medications  heparin bolus via infusion 5,000 Units (has no administration in time range)  heparin ADULT infusion 100 units/mL (25000 units/264m sodium chloride 0.45%) (has no administration in time range)  furosemide (LASIX) injection 40 mg (40 mg  Intravenous Given 09/02/18 2353)  iopamidol (ISOVUE-370) 76 % injection 100 mL (100 mLs Intravenous Contrast Given 09/03/18 0117)     Initial Impression / Assessment and Plan / ED Course  I have reviewed the triage vital signs and the nursing notes.  Pertinent labs & imaging results that were available during my care of the patient were reviewed by me and considered in my medical decision making (see chart for details).  Progressive dyspnea with presence of edema strongly suggestive of congestive heart failure.  Asymmetry of edema is worrisome for possible underlying DVT and accompanying pulmonary emboli.  Old records are reviewed, and he had a chest x-ray done today which showed bilateral pleural effusions left greater than right.  On reviewing prior x-rays, a CT scan of the abdomen done January 2019 did show a small right pleural effusion with no obvious left pleural effusion.  Labs today did show hemoglobin of 11.0 with normal RBC indices.  This is an increase compared with May 10, 2018 at which time hemoglobin was 10.4.  Albumin is normal as are transaminases and renal function.  Will check troponin, BNP, d-dimer.  He will be given a dose of furosemide.  ECG is unchanged from baseline.  D-dimer has come back significantly elevated, and CT angiogram of chest has been ordered.  He had good diuresis from furosemide and was feeling better, but was unable to lay flat to get CT angiogram because of dyspnea.  Because of concern of possible pulmonary embolism, he was started empirically on heparin.  BNP has come back normal, but can have significant CHF with normal BNP in setting of significant peripheral edema.  Case is discussed with Dr. LDarrick Meigsof [facility], who agrees  to admit the patient.  Perhaps, with further diuresis, he will be able to lay flat.  Alternatively, can obtain lower extremity ultrasound and/or nuclear medicine V/Q scan.  Final Clinical Impressions(s) / ED Diagnoses   Final  diagnoses:  Shortness of breath  Pleural effusion, bilateral  Peripheral edema  Elevated d-dimer  Normochromic normocytic anemia    ED Discharge Orders    None       Delora Fuel, MD 17/51/02 0214

## 2018-09-03 ENCOUNTER — Emergency Department (HOSPITAL_COMMUNITY): Payer: Medicare Other

## 2018-09-03 ENCOUNTER — Inpatient Hospital Stay (HOSPITAL_COMMUNITY): Payer: Medicare Other

## 2018-09-03 ENCOUNTER — Encounter (HOSPITAL_COMMUNITY): Payer: Self-pay | Admitting: *Deleted

## 2018-09-03 DIAGNOSIS — J069 Acute upper respiratory infection, unspecified: Secondary | ICD-10-CM | POA: Diagnosis present

## 2018-09-03 DIAGNOSIS — Z833 Family history of diabetes mellitus: Secondary | ICD-10-CM | POA: Diagnosis not present

## 2018-09-03 DIAGNOSIS — I2510 Atherosclerotic heart disease of native coronary artery without angina pectoris: Secondary | ICD-10-CM | POA: Diagnosis present

## 2018-09-03 DIAGNOSIS — I2699 Other pulmonary embolism without acute cor pulmonale: Secondary | ICD-10-CM | POA: Diagnosis present

## 2018-09-03 DIAGNOSIS — R0602 Shortness of breath: Secondary | ICD-10-CM | POA: Diagnosis not present

## 2018-09-03 DIAGNOSIS — D649 Anemia, unspecified: Secondary | ICD-10-CM | POA: Diagnosis present

## 2018-09-03 DIAGNOSIS — E119 Type 2 diabetes mellitus without complications: Secondary | ICD-10-CM | POA: Diagnosis present

## 2018-09-03 DIAGNOSIS — Z794 Long term (current) use of insulin: Secondary | ICD-10-CM | POA: Diagnosis not present

## 2018-09-03 DIAGNOSIS — R7989 Other specified abnormal findings of blood chemistry: Secondary | ICD-10-CM | POA: Diagnosis not present

## 2018-09-03 DIAGNOSIS — E785 Hyperlipidemia, unspecified: Secondary | ICD-10-CM | POA: Diagnosis present

## 2018-09-03 DIAGNOSIS — T501X5A Adverse effect of loop [high-ceiling] diuretics, initial encounter: Secondary | ICD-10-CM | POA: Diagnosis not present

## 2018-09-03 DIAGNOSIS — Z87891 Personal history of nicotine dependence: Secondary | ICD-10-CM | POA: Diagnosis not present

## 2018-09-03 DIAGNOSIS — I5031 Acute diastolic (congestive) heart failure: Secondary | ICD-10-CM | POA: Diagnosis present

## 2018-09-03 DIAGNOSIS — M7989 Other specified soft tissue disorders: Secondary | ICD-10-CM

## 2018-09-03 DIAGNOSIS — R600 Localized edema: Secondary | ICD-10-CM | POA: Diagnosis not present

## 2018-09-03 DIAGNOSIS — R609 Edema, unspecified: Secondary | ICD-10-CM | POA: Diagnosis not present

## 2018-09-03 DIAGNOSIS — Z955 Presence of coronary angioplasty implant and graft: Secondary | ICD-10-CM | POA: Diagnosis not present

## 2018-09-03 DIAGNOSIS — Z7982 Long term (current) use of aspirin: Secondary | ICD-10-CM | POA: Diagnosis not present

## 2018-09-03 DIAGNOSIS — I82441 Acute embolism and thrombosis of right tibial vein: Secondary | ICD-10-CM | POA: Diagnosis present

## 2018-09-03 DIAGNOSIS — N179 Acute kidney failure, unspecified: Secondary | ICD-10-CM | POA: Diagnosis not present

## 2018-09-03 DIAGNOSIS — R0603 Acute respiratory distress: Secondary | ICD-10-CM | POA: Diagnosis present

## 2018-09-03 DIAGNOSIS — I110 Hypertensive heart disease with heart failure: Secondary | ICD-10-CM | POA: Diagnosis present

## 2018-09-03 LAB — CBC
HCT: 35.3 % — ABNORMAL LOW (ref 39.0–52.0)
HEMOGLOBIN: 11.1 g/dL — AB (ref 13.0–17.0)
MCH: 26.3 pg (ref 26.0–34.0)
MCHC: 31.4 g/dL (ref 30.0–36.0)
MCV: 83.6 fL (ref 80.0–100.0)
Platelets: 329 10*3/uL (ref 150–400)
RBC: 4.22 MIL/uL (ref 4.22–5.81)
RDW: 13.6 % (ref 11.5–15.5)
WBC: 8.3 10*3/uL (ref 4.0–10.5)
nRBC: 0 % (ref 0.0–0.2)

## 2018-09-03 LAB — COMPREHENSIVE METABOLIC PANEL
ALBUMIN: 3.9 g/dL (ref 3.5–5.0)
ALT: 14 U/L (ref 0–44)
ANION GAP: 11 (ref 5–15)
AST: 18 U/L (ref 15–41)
Alkaline Phosphatase: 102 U/L (ref 38–126)
BUN: 23 mg/dL (ref 8–23)
CO2: 26 mmol/L (ref 22–32)
Calcium: 9.4 mg/dL (ref 8.9–10.3)
Chloride: 97 mmol/L — ABNORMAL LOW (ref 98–111)
Creatinine, Ser: 0.88 mg/dL (ref 0.61–1.24)
GFR calc Af Amer: >60 mL/min (ref >60)
GFR calc non Af Amer: >60 mL/min (ref >60)
Glucose, Bld: 139 mg/dL — ABNORMAL HIGH (ref 70–99)
Potassium: 3.8 mmol/L (ref 3.5–5.1)
Sodium: 134 mmol/L — ABNORMAL LOW (ref 135–145)
Total Bilirubin: 0.8 mg/dL (ref 0.3–1.2)
Total Protein: 7.7 g/dL (ref 6.5–8.1)

## 2018-09-03 LAB — BRAIN NATRIURETIC PEPTIDE: B Natriuretic Peptide: 79 pg/mL (ref 0.0–100.0)

## 2018-09-03 LAB — URINALYSIS, ROUTINE W REFLEX MICROSCOPIC
Bilirubin Urine: NEGATIVE
Glucose, UA: NEGATIVE mg/dL
Hgb urine dipstick: NEGATIVE
Ketones, ur: NEGATIVE mg/dL
Leukocytes, UA: NEGATIVE
Nitrite: NEGATIVE
Protein, ur: 100 mg/dL — AB
Specific Gravity, Urine: 1.017 (ref 1.005–1.030)
pH: 5 (ref 5.0–8.0)

## 2018-09-03 LAB — PROTIME-INR
INR: 1.03
Prothrombin Time: 13.4 seconds (ref 11.4–15.2)

## 2018-09-03 LAB — HEPARIN LEVEL (UNFRACTIONATED)
HEPARIN UNFRACTIONATED: 0.4 [IU]/mL (ref 0.30–0.70)
Heparin Unfractionated: <0.1 IU/mL — ABNORMAL LOW (ref 0.30–0.70)

## 2018-09-03 LAB — ECHOCARDIOGRAM COMPLETE
Height: 72 in
Weight: 3421.54 oz

## 2018-09-03 LAB — TROPONIN I: Troponin I: <0.03 ng/mL (ref <0.03)

## 2018-09-03 LAB — APTT: APTT: 41 s — AB (ref 24–36)

## 2018-09-03 LAB — HEMOGLOBIN A1C
Hgb A1c MFr Bld: 7.6 % — ABNORMAL HIGH (ref 4.8–5.6)
Mean Plasma Glucose: 171.42 mg/dL

## 2018-09-03 LAB — GLUCOSE, CAPILLARY
GLUCOSE-CAPILLARY: 164 mg/dL — AB (ref 70–99)
Glucose-Capillary: 143 mg/dL — ABNORMAL HIGH (ref 70–99)
Glucose-Capillary: 140 mg/dL — ABNORMAL HIGH (ref 70–99)

## 2018-09-03 LAB — D-DIMER, QUANTITATIVE: D-Dimer, Quant: 4.68 ug/mL-FEU — ABNORMAL HIGH (ref 0.00–0.50)

## 2018-09-03 LAB — CBG MONITORING, ED: Glucose-Capillary: 127 mg/dL — ABNORMAL HIGH (ref 70–99)

## 2018-09-03 MED ORDER — HEPARIN BOLUS VIA INFUSION
5000.0000 [IU] | Freq: Once | INTRAVENOUS | Status: AC
  Administered 2018-09-03: 5000 [IU] via INTRAVENOUS

## 2018-09-03 MED ORDER — ALBUTEROL SULFATE (2.5 MG/3ML) 0.083% IN NEBU: 2.5000 mg | INHALATION_SOLUTION | RESPIRATORY_TRACT | Status: DC | PRN

## 2018-09-03 MED ORDER — PANTOPRAZOLE SODIUM 40 MG PO TBEC
40.0000 mg | DELAYED_RELEASE_TABLET | Freq: Every day | ORAL | Status: DC
  Administered 2018-09-03 – 2018-09-04 (×2): 40 mg via ORAL
  Filled 2018-09-03 (×2): qty 1

## 2018-09-03 MED ORDER — ATORVASTATIN CALCIUM 40 MG PO TABS
80.0000 mg | ORAL_TABLET | Freq: Every day | ORAL | Status: DC
  Administered 2018-09-03 – 2018-09-04 (×2): 80 mg via ORAL
  Filled 2018-09-03 (×2): qty 2

## 2018-09-03 MED ORDER — FUROSEMIDE 10 MG/ML IJ SOLN
40.0000 mg | Freq: Two times a day (BID) | INTRAMUSCULAR | Status: DC
  Administered 2018-09-03 – 2018-09-04 (×2): 40 mg via INTRAVENOUS
  Filled 2018-09-03 (×2): qty 4

## 2018-09-03 MED ORDER — DEXTROMETHORPHAN POLISTIREX ER 30 MG/5ML PO SUER
30.0000 mg | Freq: Two times a day (BID) | ORAL | Status: DC | PRN
  Administered 2018-09-03 – 2018-09-04 (×3): 30 mg via ORAL
  Filled 2018-09-03 (×3): qty 5

## 2018-09-03 MED ORDER — INSULIN GLARGINE 100 UNIT/ML ~~LOC~~ SOLN
17.0000 [IU] | Freq: Every day | SUBCUTANEOUS | Status: DC
  Administered 2018-09-03 – 2018-09-04 (×2): 17 [IU] via SUBCUTANEOUS
  Filled 2018-09-03 (×3): qty 0.17

## 2018-09-03 MED ORDER — DUTASTERIDE 0.5 MG PO CAPS
0.5000 mg | ORAL_CAPSULE | Freq: Every day | ORAL | Status: DC
  Administered 2018-09-04: 0.5 mg via ORAL
  Filled 2018-09-03 (×5): qty 1

## 2018-09-03 MED ORDER — SODIUM CHLORIDE 0.9% FLUSH: 3.0000 mL | INTRAVENOUS | Status: DC | PRN

## 2018-09-03 MED ORDER — SODIUM CHLORIDE 0.9 % IV SOLN: 250.0000 mL | INTRAVENOUS | Status: DC | PRN

## 2018-09-03 MED ORDER — ONDANSETRON HCL 4 MG PO TABS: 4.0000 mg | ORAL_TABLET | Freq: Four times a day (QID) | ORAL | Status: DC | PRN

## 2018-09-03 MED ORDER — CARVEDILOL 12.5 MG PO TABS
12.5000 mg | ORAL_TABLET | Freq: Two times a day (BID) | ORAL | Status: DC
  Administered 2018-09-03 – 2018-09-05 (×3): 12.5 mg via ORAL
  Filled 2018-09-03 (×4): qty 1

## 2018-09-03 MED ORDER — INSULIN ASPART 100 UNIT/ML ~~LOC~~ SOLN
0.0000 [IU] | Freq: Three times a day (TID) | SUBCUTANEOUS | Status: DC
  Administered 2018-09-03: 3 [IU] via SUBCUTANEOUS
  Administered 2018-09-04 – 2018-09-05 (×2): 1 [IU] via SUBCUTANEOUS

## 2018-09-03 MED ORDER — AMLODIPINE BESYLATE 5 MG PO TABS
10.0000 mg | ORAL_TABLET | Freq: Every day | ORAL | Status: DC
  Administered 2018-09-04 – 2018-09-05 (×2): 10 mg via ORAL
  Filled 2018-09-03 (×2): qty 2

## 2018-09-03 MED ORDER — ONDANSETRON HCL 4 MG/2ML IJ SOLN: 4.0000 mg | Freq: Four times a day (QID) | INTRAMUSCULAR | Status: DC | PRN

## 2018-09-03 MED ORDER — LISINOPRIL 10 MG PO TABS
10.0000 mg | ORAL_TABLET | Freq: Every day | ORAL | Status: DC
  Administered 2018-09-03: 10 mg via ORAL
  Filled 2018-09-03: qty 1

## 2018-09-03 MED ORDER — ASPIRIN EC 81 MG PO TBEC
81.0000 mg | DELAYED_RELEASE_TABLET | Freq: Every day | ORAL | Status: DC
  Administered 2018-09-04 – 2018-09-05 (×2): 81 mg via ORAL
  Filled 2018-09-03 (×2): qty 1

## 2018-09-03 MED ORDER — HEPARIN (PORCINE) 25000 UT/250ML-% IV SOLN
2000.0000 [IU]/h | INTRAVENOUS | Status: DC
  Administered 2018-09-03: 2000 [IU]/h via INTRAVENOUS
  Administered 2018-09-03: 1650 [IU]/h via INTRAVENOUS
  Administered 2018-09-04 – 2018-09-05 (×3): 2000 [IU]/h via INTRAVENOUS
  Filled 2018-09-03 (×5): qty 250

## 2018-09-03 MED ORDER — SODIUM CHLORIDE 0.9% FLUSH
3.0000 mL | Freq: Two times a day (BID) | INTRAVENOUS | Status: DC
  Administered 2018-09-05: 3 mL via INTRAVENOUS

## 2018-09-03 MED ORDER — HEPARIN BOLUS VIA INFUSION
3000.0000 [IU] | Freq: Once | INTRAVENOUS | Status: AC
  Administered 2018-09-03: 3000 [IU] via INTRAVENOUS
  Filled 2018-09-03: qty 3000

## 2018-09-03 MED ORDER — BRIMONIDINE TARTRATE-TIMOLOL 0.2-0.5 % OP SOLN
1.0000 [drp] | Freq: Two times a day (BID) | OPHTHALMIC | Status: DC
  Filled 2018-09-03: qty 5

## 2018-09-03 MED ORDER — BRIMONIDINE TARTRATE 0.2 % OP SOLN
1.0000 [drp] | Freq: Two times a day (BID) | OPHTHALMIC | Status: DC
  Administered 2018-09-03 – 2018-09-05 (×4): 1 [drp] via OPHTHALMIC
  Filled 2018-09-03: qty 5

## 2018-09-03 MED ORDER — IOPAMIDOL (ISOVUE-370) INJECTION 76%: 100.0000 mL | Freq: Once | INTRAVENOUS | Status: DC | PRN

## 2018-09-03 MED ORDER — TIMOLOL MALEATE 0.5 % OP SOLN
1.0000 [drp] | Freq: Two times a day (BID) | OPHTHALMIC | Status: DC
  Administered 2018-09-03 – 2018-09-05 (×4): 1 [drp] via OPHTHALMIC
  Filled 2018-09-03 (×2): qty 5

## 2018-09-03 NOTE — Progress Notes (Signed)
ANTICOAGULATION CONSULT NOTE - Preliminary  Pharmacy Consult for Heparin Indication: Pulmonary embolism  Allergies  Allergen Reactions  . Adhesive [Tape]     bandaids cause blisters after leaving on for 24 hours   . Sulfa Antibiotics Other (See Comments)    "was terrible" "eye went crazy"  . Latex Other (See Comments)    blisters    Patient Measurements: Weight: 213 lb 13.5 oz (97 kg)     Vital Signs: Temp: 97.7 F (36.5 C) (02/07 2138) Temp Source: Oral (02/07 2138) BP: 165/74 (02/08 0300) Pulse Rate: 88 (02/08 0300)  Labs: Recent Labs    09/02/18 1051 09/02/18 1231 09/02/18 2347  HGB 7.7* 11.0*  --   HCT  --  35.2*  --   PLT  --  315  --   APTT  --   --  41*  LABPROT  --   --  13.4  INR  --   --  1.03  CREATININE  --  0.92  --   TROPONINI  --   --  <0.03   Estimated Creatinine Clearance: 74.8 mL/min (by C-G formula based on SCr of 0.92 mg/dL).  Medical History: Past Medical History:  Diagnosis Date  . Anginal pain ([place])   . Arthritis    Bilateral knee  . Coronary artery disease   . Diabetes mellitus    insulin dependent   . Hyperlipidemia   . Hypertension   . Progressive angina ([place]) 03/2015   3.0x12 mm Resolute DES to pLAD, Angiosculpt scoring balloon to dLAD  . Stroke [facility])     Medications:   Assessment: 85 yo male presented to the ED for SOB which has been progressive over past several months and worsening significantly x several days. Chest x-ray shows bilateral pleural effusions with concern for underlying DVT/pulmonary emboli. D-dimer is elevated. Pharmacy has been consulted for IV heparin dosing.  Goal of Therapy:  Heparin level 0.3-0.7 units/ml Monitor platelets by anticoagulation protocol: Yes   Plan:  Heparin 5000 unit IV bolus Heparin infusion at 1650 units/hr Heparin level in 6-8 hrs  Preliminary review of pertinent patient information completed.  Forestine Na clinical pharmacist will complete review during morning rounds to assess  the patient and finalize treatment regimen.  Norberto Sorenson, [facility] 09/03/2018,3:09 AM

## 2018-09-03 NOTE — Progress Notes (Signed)
ANTICOAGULATION CONSULT NOTE -  Pharmacy Consult for Heparin Indication: Pulmonary embolism  Allergies  Allergen Reactions  . Adhesive [Tape]     bandaids cause blisters after leaving on for 24 hours   . Sulfa Antibiotics Other (See Comments)    "was terrible" "eye went crazy"  . Latex Other (See Comments)    blisters    Patient Measurements: Height: 6' (182.9 cm) Weight: 213 lb 13.5 oz (97 kg) IBW/kg (Calculated) : 77.6 HEPARIN DW (KG): 97  Vital Signs: Temp: 97.9 F (36.6 C) (02/08 1321) Temp Source: Oral (02/08 1321) BP: 129/70 (02/08 1321) Pulse Rate: 77 (02/08 1321)  Labs: Recent Labs    09/02/18 1231 09/02/18 2347 09/03/18 0544 09/03/18 1053 09/03/18 2020  HGB 11.0*  --  11.1*  --   --   HCT 35.2*  --  35.3*  --   --   PLT 315  --  329  --   --   APTT  --  41*  --   --   --   LABPROT  --  13.4  --   --   --   INR  --  1.03  --   --   --   HEPARINUNFRC  --   --   --  <0.10* 0.40  CREATININE 0.92  --  0.88  --   --   TROPONINI  --  <0.03  --   --   --    Estimated Creatinine Clearance: 78.2 mL/min (by C-G formula based on SCr of 0.88 mg/dL).  Medical History: Past Medical History:  Diagnosis Date  . Anginal pain ([place])   . Arthritis    Bilateral knee  . Coronary artery disease   . Diabetes mellitus    insulin dependent   . Hyperlipidemia   . Hypertension   . Progressive angina ([place]) 03/2015   3.0x12 mm Resolute DES to pLAD, Angiosculpt scoring balloon to dLAD  . Stroke [facility])     Medications:   Assessment: 85 yo male presented to the ED for SOB which has been progressive over past several months and worsening significantly x several days. Chest x-ray shows bilateral pleural effusions with concern for underlying DVT/pulmonary emboli. D-dimer is elevated. Pharmacy has been consulted for IV heparin dosing.  Heparin level is therapeutic now.  Goal of Therapy:  Heparin level 0.3-0.7 units/ml Monitor platelets by anticoagulation protocol: Yes    Plan:  Continue Heparin infusion at 2000 units/hr Heparin level in 6-8 hrs and daily while on heparin Monitor for S/S of bleeding F/U transition to po tx  Isac Sarna, BS [place], BCPS Clinical Pharmacist Pager [phone] 09/03/2018,9:15 PM

## 2018-09-03 NOTE — Progress Notes (Signed)
ANTICOAGULATION CONSULT NOTE - Preliminary  Pharmacy Consult for Heparin Indication: Pulmonary embolism  Allergies  Allergen Reactions  . Adhesive [Tape]     bandaids cause blisters after leaving on for 24 hours   . Sulfa Antibiotics Other (See Comments)    "was terrible" "eye went crazy"  . Latex Other (See Comments)    blisters    Patient Measurements: Height: 6' (182.9 cm) Weight: 213 lb 13.5 oz (97 kg) IBW/kg (Calculated) : 77.6 HEPARIN DW (KG): 97  Vital Signs: Temp: 98 F (36.7 C) (02/08 0854) Temp Source: Oral (02/08 0854) BP: 151/87 (02/08 0854) Pulse Rate: 88 (02/08 0854)  Labs: Recent Labs    09/02/18 1231 09/02/18 2347 09/03/18 0544 09/03/18 1053  HGB 11.0*  --  11.1*  --   HCT 35.2*  --  35.3*  --   PLT 315  --  329  --   APTT  --  41*  --   --   LABPROT  --  13.4  --   --   INR  --  1.03  --   --   HEPARINUNFRC  --   --   --  <0.10*  CREATININE 0.92  --  0.88  --   TROPONINI  --  <0.03  --   --    Estimated Creatinine Clearance: 78.2 mL/min (by C-G formula based on SCr of 0.88 mg/dL).  Medical History: Past Medical History:  Diagnosis Date  . Anginal pain ([place])   . Arthritis    Bilateral knee  . Coronary artery disease   . Diabetes mellitus    insulin dependent   . Hyperlipidemia   . Hypertension   . Progressive angina ([place]) 03/2015   3.0x12 mm Resolute DES to pLAD, Angiosculpt scoring balloon to dLAD  . Stroke [facility])     Medications:   Assessment: 85 yo male presented to the ED for SOB which has been progressive over past several months and worsening significantly x several days. Chest x-ray shows bilateral pleural effusions with concern for underlying DVT/pulmonary emboli. D-dimer is elevated. Pharmacy has been consulted for IV heparin dosing.  Heparin level is subtherapeutic  Goal of Therapy:  Heparin level 0.3-0.7 units/ml Monitor platelets by anticoagulation protocol: Yes   Plan:  Heparin 3000 unit IV bolus Increase Heparin  infusion to 2000 units/hr Heparin level in 6-8 hrs and daily while on heparin Monitor for S/S of bleeding F/U transition to po tx  Isac Sarna, BS [place], BCPS Clinical Pharmacist Pager [phone] 09/03/2018,11:58 AM

## 2018-09-03 NOTE — Plan of Care (Signed)
Patient alert and oriented x 4. Acclimated to room and unit. No complaints noted thus far. Heparin drip in progress. Patient has been voiding appropriately. He has had one bowel movement. Patient educated on diagnosis. Vitals remain stable. Fall risk and arm band placed.

## 2018-09-03 NOTE — H&P (Addendum)
TRH H&P    Patient Demographics:    Jerry Burns, is a 85 y.o. male  MRN: [id number]  DOB - 08-Feb-1934  Admit Date - 09/02/2018  Referring MD/NP/PA: Dr. Roxanne Mins  Outpatient Primary MD for the patient is Kathyrn Drown, MD  Patient coming from: Home  Chief complaint-shortness of breath   HPI:    Jerry Burns  is a 85 y.o. male, history of hypertension, diabetes mellitus type 2, hyperlipidemia, coronary artery disease status post stent to the proximal LAD,Came to hospital with worsening shortness of breath.Patient says that for the past 2 weeks shortness of breath has gotten worse.  And he is too dyspneic to lie flat and had to sit up all night. He admits to coughing up clear phlegm.  No fever but has been feeling cold. Denies nausea vomiting or diarrhea. Denies chest pain. He went to his PCP today where fingerstick hemoglobin showed hemoglobin of 7.7 whereas CBC showed hemoglobin around 11. In the ED patient was given Lasix 40 mg IV with excellent diuretic response. He was found to have bilateral lower extremity swelling left more than right, concerning for venous thromboembolism.  CTA chest ordered but could not be done as patient unable to lie flat.  IV heparin started in the ED empirically for?  PE.   Review of systems:    In addition to the HPI above,    All other systems reviewed and are negative.    Past History of the following :    Past Medical History:  Diagnosis Date  . Anginal pain ([place])   . Arthritis    Bilateral knee  . Coronary artery disease   . Diabetes mellitus    insulin dependent   . Hyperlipidemia   . Hypertension   . Progressive angina ([place]) 03/2015   3.0x12 mm Resolute DES to pLAD, Angiosculpt scoring balloon to dLAD  . [facility]       Past Surgical History:  Procedure Laterality Date  . APPENDECTOMY    . CARDIAC CATHETERIZATION N/A 04/12/2015   Procedure: Left  Heart Cath and Coronary Angiography;  Surgeon: Sherren Mocha, MD; pLAD 75>0% w/ 3.0x12 mm Resolute DES, dLAD 80>0% w/ Angiosculpt scoring balloon, CFX luminal irreg, RCA mild dz, EF 55-65%   . COLONOSCOPY  2011   RMR: 1. Normal rectum 2. Left sided diverticula , 2 ascending colon polyps, status post snare polypectomy. Remainder of colonic mucosa appeared unremarkable.   . COLONOSCOPY N/A 05/13/2016   Procedure: COLONOSCOPY;  Surgeon: Daneil Dolin, MD;  [facility];  Service: Endoscopy;  Laterality: N/A;  7:30 AM  . COLONOSCOPY N/A 05/18/2018   Procedure: COLONOSCOPY;  Surgeon: Daneil Dolin, MD;  [facility];  Service: Endoscopy;  Laterality: N/A;  1:15pm  . CORONARY ANGIOGRAPHY N/A 02/04/2017   Procedure: CORONARY ANGIOGRAPHY (CATH LAB);  Surgeon: Troy Sine, MD;  Location: [facility] CV LAB;  Service: Cardiovascular;  Laterality: N/A;  . CORONARY ANGIOPLASTY WITH STENT PLACEMENT  08/22/2014   PTCA/DES X mLAD with 2.5 x 16  mm Promus Premier DES by Dr Julianne Handler  . ESOPHAGOGASTRODUODENOSCOPY N/A 05/18/2018   Procedure: ESOPHAGOGASTRODUODENOSCOPY (EGD);  Surgeon: Daneil Dolin, MD;  [facility];  Service: Endoscopy;  Laterality: N/A;  . FOOT SURGERY Right   . LEFT HEART CATHETERIZATION WITH CORONARY ANGIOGRAM N/A 08/22/2014   Procedure: LEFT HEART CATHETERIZATION WITH CORONARY ANGIOGRAM;  Surgeon: Burnell Blanks, MD;  Location: [facility] CATH LAB;  Service: Cardiovascular;  Laterality: N/A;  . PERCUTANEOUS CORONARY STENT INTERVENTION (PCI-S)  08/22/2014   Procedure: PERCUTANEOUS CORONARY STENT INTERVENTION (PCI-S);  Surgeon: Burnell Blanks, MD;   . SHOULDER SURGERY    . TONSILLECTOMY        Social History:      Social History   Tobacco Use  . Smoking status: Former Smoker    Packs/day: 1.00    Years: 20.00    Pack years: 20.00    Types: Cigarettes, Cigars    Start date: 08/10/1955    Last attempt to quit: 07/28/2007    Years since  quitting: 11.1  . Smokeless tobacco: Never Used  . Tobacco comment: Quit x 15 years  Substance Use Topics  . Alcohol use: Yes    Alcohol/week: 0.0 standard drinks    Comment: Daily about 1 glass of wine       Family History :     Family History  Problem Relation Age of Onset  . Diabetes Other   . Diabetes Brother   . Colon cancer Maternal Uncle   . Skin cancer Mother   . Diabetes Father   . Hypertension Sister       Home Medications:   Prior to Admission medications   Medication Sig Start Date End Date Taking? Authorizing Provider  amLODipine (NORVASC) 10 MG tablet TAKE (1) TABLET BY MOUTH ONCE DAILY. Patient taking differently: Take 10 mg by mouth daily.  04/22/18   Kathyrn Drown, MD  aspirin EC 81 MG tablet Take 81 mg by mouth daily.    [provider]  atorvastatin (LIPITOR) 80 MG tablet Take 1 tablet (80 mg total) by mouth daily. 07/11/18   Kathyrn Drown, MD  carvedilol (COREG) 12.5 MG tablet TAKE (1) TABLET BY MOUTH TWICE DAILY. 03/14/18   Herminio Commons, MD  COMBIGAN 0.2-0.5 % ophthalmic solution Place 1 drop into the left eye 2 (two) times daily.  09/06/15   [provider]  dutasteride (AVODART) 0.5 MG capsule TAKE ONE CAPSULE BY MOUTH ONCE DAILY. 08/22/18   Kathyrn Drown, MD  Emollient (CERAVE) CREA Apply 1 application topically daily as needed (dry skin).    [provider]  erythromycin ophthalmic ointment Place 1 application into the left eye 2 (two) times daily.     [provider]  Flaxseed, Linseed, (FLAXSEED OIL) 1200 MG CAPS Take 1,200 mg by mouth daily.    [provider]  glucose blood (CONTOUR NEXT TEST) test strip USE ONE STRIP TO CHECK GLUCOSE THREE TIMES DAILY. DX E11.9 02/23/18   Kathyrn Drown, MD  Insulin Glargine (LANTUS SOLOSTAR) 100 UNIT/ML Solostar Pen INJECT 22 UNITS SUBCUTANEOUSLY ONCE DAILY AT  10  PM Patient taking differently: Inject 17 Units into the skin at bedtime.  01/11/18   Kathyrn Drown, MD  metFORMIN (GLUCOPHAGE) 1000 MG tablet TAKE (1) TABLET BY MOUTH TWICE A DAY WITH A MEAL. 05/30/18   Kathyrn Drown, MD  mometasone (ELOCON) 0.1 % cream Apply to affected area daily 07/15/18 07/15/19  Mikey Kirschner, MD  Multiple Vitamin (MULTIVITAMIN) tablet Take 1 tablet by mouth daily.    [provider]  Na Sulfate-K Sulfate-Mg Sulf 17.5-3.13-1.6 GM/177ML SOLN Take 1 kit by mouth as directed. 04/11/18   Rourk, Cristopher Estimable, MD  Na Sulfate-K Sulfate-Mg Sulf 17.5-3.13-1.6 GM/177ML SOLN Take 1 kit by mouth as directed. 05/13/18   Rourk, Cristopher Estimable, MD  Naproxen Sod-diphenhydrAMINE (ALEVE PM) 220-25 MG TABS Take 1 tablet by mouth at bedtime.    [provider]  nitroGLYCERIN (NITROSTAT) 0.4 MG SL tablet PLACE 1 TAB UNDER TONGUE EVERY 5 MIN IF NEEDED FOR CHEST PAIN. MAY USE 3 TIMES.NO RELIEF CALL 911. Patient taking differently: Place 0.4 mg under the tongue every 5 (five) minutes as needed for chest pain.  04/22/18   Herminio Commons, MD  Polyvinyl Alcohol-Povidone (REFRESH OP) Place 1 drop into the left eye 2 (two) times daily.    [provider]  PROAIR HFA 108 (90 Base) MCG/ACT inhaler INHALE 2 PUFFS INTO THE LUNGS EVERY 6 HOURS AS NEEDED FOR WHEEZING ORSHORTNESS OF BREATH. 08/26/18   Kathyrn Drown, MD  psyllium (METAMUCIL) 58.6 % powder Take 1 packet by mouth daily.     [provider]  quinapril (ACCUPRIL) 10 MG tablet Take 1 tablet (10 mg total) by mouth daily. 03/14/18   Herminio Commons, MD  sitaGLIPtin (JANUVIA) 100 MG tablet Take 1 tablet (100 mg total) by mouth daily. 07/21/18   Kathyrn Drown, MD  tamsulosin (FLOMAX) 0.4 MG CAPS capsule Take 1 capsule (0.4 mg total) by mouth daily. 07/21/18   Kathyrn Drown, MD  Turmeric 500 MG CAPS Take 500 mg by mouth daily.    [provider]     Allergies:     Allergies  Allergen Reactions  . Adhesive [Tape]     bandaids cause blisters after leaving on for 24 hours   . Sulfa  Antibiotics Other (See Comments)    "was terrible" "eye went crazy"  . Latex Other (See Comments)    blisters     Physical Exam:   Vitals  Blood pressure (!) 165/74, pulse 88, temperature 97.7 F (36.5 C), temperature source Oral, resp. rate (!) 24, weight 97 kg, SpO2 95 %.  1.  General: Appears in no acute distress  2. Psychiatric: Alert, oriented x3, intact insight and judgment  3. Neurologic: Cranial nerves II to XII grossly intact, motor strength 5/5 in all extremities  4. HEENMT:  Atraumatic normocephalic, extraocular muscles intact, oral mucosa is moist  5. Respiratory : Bibasilar crackles auscultated  6. Cardiovascular : S1-S2, regular, no murmur auscultated  7. Gastrointestinal:  Abdomen is soft, nontender, no organomegaly  8. Skin:  No rashes noted  9.Musculoskeletal:  Bilateral lower extremity edema 2+ pitting edema, left more than right    Data Review:    CBC Recent Labs  Lab 09/02/18 1051 09/02/18 1231  WBC  --  8.5  HGB 7.7* 11.0*  HCT  --  35.2*  PLT  --  315  MCV  --  85.4  MCH  --  26.7  MCHC  --  31.3  RDW  --  13.6  LYMPHSABS  --  1.1  MONOABS  --  0.8  EOSABS  --  0.8*  BASOSABS  --  0.1   ------------------------------------------------------------------------------------------------------------------  Results for orders placed or performed during the hospital encounter of 09/02/18 (from the past 48 hour(s))  D-dimer, quantitative     Status: Abnormal   Collection Time: 09/02/18 11:47 PM  Result Value Ref Range   D-Dimer, Quant 4.68 (H) 0.00 - 0.50 ug/mL-FEU    Comment: (NOTE) At the manufacturer cut-off of 0.50 ug/mL FEU, this assay has been documented to exclude PE with a sensitivity and negative predictive value of 97 to 99%.  At this time, this assay has not been approved by the FDA to exclude DVT/VTE. Results should be correlated with clinical presentation. Performed at [facility], [address], [place], [place]  [address]   Brain natriuretic peptide     Status: None   Collection Time: 09/02/18 11:47 PM  Result Value Ref Range   B Natriuretic Peptide 79.0 0.0 - 100.0 pg/mL    Comment: Performed at [facility], [address] [place], [place] [address]  Troponin I - Once     Status: None   Collection Time: 09/02/18 11:47 PM  Result Value Ref Range   Troponin I <0.03 <0.03 ng/mL    Comment: Performed at [facility], [address] [place], [place] [address]  Protime-INR     Status: None   Collection Time: 09/02/18 11:47 PM  Result Value Ref Range   Prothrombin Time 13.4 11.4 - 15.2 seconds   INR 1.03     Comment: Performed at [facility], [address], [place], [place] [address]  APTT     Status: Abnormal   Collection Time: 09/02/18 11:47 PM  Result Value Ref Range   aPTT 41 (H) 24 - 36 seconds    Comment:        IF BASELINE aPTT IS ELEVATED, SUGGEST PATIENT RISK ASSESSMENT BE USED TO DETERMINE APPROPRIATE ANTICOAGULANT THERAPY. Performed at [facility], [address], [place], [place] [address]   Urinalysis, Routine w reflex microscopic     Status: Abnormal   Collection Time: 09/02/18 11:50 PM  Result Value Ref Range   Color, Urine YELLOW YELLOW   APPearance CLEAR CLEAR   Specific Gravity, Urine 1.017 1.005 - 1.030   pH 5.0 5.0 - 8.0   Glucose, UA NEGATIVE NEGATIVE mg/dL   Hgb urine dipstick NEGATIVE NEGATIVE   Bilirubin Urine NEGATIVE NEGATIVE   Ketones, ur NEGATIVE NEGATIVE mg/dL   Protein, ur 100 (A) NEGATIVE mg/dL   Nitrite NEGATIVE NEGATIVE   Leukocytes, UA NEGATIVE NEGATIVE   RBC / HPF 0-5 0 - 5 RBC/hpf   WBC, UA 0-5 0 - 5 WBC/hpf   Bacteria, UA RARE (A) NONE SEEN   Squamous Epithelial / LPF 0-5 0 - 5   Mucus PRESENT     Comment: Performed at [facility], [address], [place], [place] [address]    Chemistries  Recent Labs  Lab 09/02/18 1231  NA 137  K 4.8  CL 101  CO2 25  GLUCOSE 137*  BUN 24*  CREATININE 0.92  CALCIUM 9.5  AST 18  ALT 15  ALKPHOS 99    BILITOT 0.5   ------------------------------------------------------------------------------------------------------------------  ------------------------------------------------------------------------------------------------------------------ GFR: Estimated Creatinine Clearance: 74.8 mL/min (by C-G formula based on SCr of 0.92 mg/dL). Liver Function Tests: Recent Labs  Lab 09/02/18 1231  AST 18  ALT 15  ALKPHOS 99  BILITOT 0.5  PROT 7.7  ALBUMIN 3.8   No results for input(s): LIPASE, AMYLASE in the last 168 hours. No results for input(s): AMMONIA in the last 168 hours. Coagulation Profile: Recent Labs  Lab 09/02/18 2347  INR 1.03   Cardiac Enzymes: Recent Labs  Lab 09/02/18 2347  TROPONINI <0.03    Anemia Panel: Recent Labs    09/02/18 1231  FERRITIN 75  TIBC 303  IRON 28*  RETICCTPCT 0.9    --------------------------------------------------------------------------------------------------------------- Urine analysis:    Component Value Date/Time   COLORURINE YELLOW 09/02/2018 2350   APPEARANCEUR CLEAR 09/02/2018 2350   LABSPEC 1.017 09/02/2018 2350   PHURINE 5.0 09/02/2018 2350   GLUCOSEU NEGATIVE 09/02/2018 2350   HGBUR NEGATIVE 09/02/2018 2350   HGBUR trace-intact 01/12/2007 1307   BILIRUBINUR NEGATIVE 09/02/2018 2350   KETONESUR NEGATIVE 09/02/2018 2350   PROTEINUR 100 (A) 09/02/2018 2350   UROBILINOGEN 0.2 01/12/2007 1307   NITRITE NEGATIVE 09/02/2018 2350   LEUKOCYTESUR NEGATIVE 09/02/2018 2350      Imaging Results:    Dg Chest 2 View  Result Date: 09/02/2018 CLINICAL DATA:  Shortness of breath and cough EXAM: CHEST - 2 VIEW COMPARISON:  02/02/2017 FINDINGS: Cardiac shadow is stable. The lungs are well aerated bilaterally. Bilateral pleural effusions are noted left greater than right. Degenerative changes of the thoracic spine are noted. IMPRESSION: Bilateral pleural effusions left greater than right. Electronically Signed   By: Inez Catalina  M.D.   On: 09/02/2018 12:20    My personal review of EKG: Rhythm NSR   Assessment & Plan:    Active Problems:   Acute diastolic CHF (congestive heart failure) ([place])   1. Acute diastolic CHF-patient's last echocardiogram from 2016 showed EF 65% with grade 2 diastolic dysfunction.  Patient started on IV Lasix in the ED.  We will continue Lasix 40 mg IV every 12 hours.  Strict intake and output.  2. Diabetes mellitus type 2-continue Lantus 22 units subcu daily, will start sliding scale insulin with NovoLog.  Hold metformin.  3. Coronary artery disease-status post stents x1.  Continue Coreg, Lipitor, aspirin.  4. Hypertension-blood pressure is stable, continue Norvasc, Coreg.  5. Elevated d-dimer-d-dimer is elevated 4.68, patient has bilateral lower extremity swelling left more than right.  Will order venous duplex of lower extremities in a.m.  CTA chest ordered but could not be done as patient was unable to lay flat.  Once patient improves with diuresis, consider obtaining CTA chest.  Patient has been started on IV heparin.   DVT Prophylaxis-   Heparin  AM Labs Ordered, also please review Full Orders  Family Communication: Admission, patients condition and plan of care including tests being ordered have been discussed with the patient  who indicate understanding and agree with the plan and Code Status.  Code Status: Full code  Admission status: Inpatient: Based on patients clinical presentation and evaluation of above clinical data, I have made determination that patient meets Inpatient criteria at this time.  Time spent in minutes : 60 minutes   Oswald Hillock M.D on 09/03/2018 at 3:44 AM

## 2018-09-03 NOTE — Progress Notes (Signed)
COVERAGE NOTE   09/03/2018 4:09 PM  Jerry Burns was seen and examined.  The H&P by the admitting provider, orders, imaging was reviewed.  Please see new orders.  Will continue to follow.   Vitals:   09/03/18 0854 09/03/18 1321  BP: (!) 151/87 129/70  Pulse: 88 77  Resp: 18   Temp: 98 F (36.7 C) 97.9 F (36.6 C)  SpO2: 97% 99%    Results for orders placed or performed during the hospital encounter of 09/02/18  D-dimer, quantitative  Result Value Ref Range   D-Dimer, Quant 4.68 (H) 0.00 - 0.50 ug/mL-FEU  Brain natriuretic peptide  Result Value Ref Range   B Natriuretic Peptide 79.0 0.0 - 100.0 pg/mL  Troponin I - Once  Result Value Ref Range   Troponin I <0.03 <0.03 ng/mL  Urinalysis, Routine w reflex microscopic  Result Value Ref Range   Color, Urine YELLOW YELLOW   APPearance CLEAR CLEAR   Specific Gravity, Urine 1.017 1.005 - 1.030   pH 5.0 5.0 - 8.0   Glucose, UA NEGATIVE NEGATIVE mg/dL   Hgb urine dipstick NEGATIVE NEGATIVE   Bilirubin Urine NEGATIVE NEGATIVE   Ketones, ur NEGATIVE NEGATIVE mg/dL   Protein, ur 100 (A) NEGATIVE mg/dL   Nitrite NEGATIVE NEGATIVE   Leukocytes, UA NEGATIVE NEGATIVE   RBC / HPF 0-5 0 - 5 RBC/hpf   WBC, UA 0-5 0 - 5 WBC/hpf   Bacteria, UA RARE (A) NONE SEEN   Squamous Epithelial / LPF 0-5 0 - 5   Mucus PRESENT   Protime-INR  Result Value Ref Range   Prothrombin Time 13.4 11.4 - 15.2 seconds   INR 1.03   APTT  Result Value Ref Range   aPTT 41 (H) 24 - 36 seconds  Heparin level (unfractionated)  Result Value Ref Range   Heparin Unfractionated <0.10 (L) 0.30 - 0.70 IU/mL  CBC  Result Value Ref Range   WBC 8.3 4.0 - 10.5 K/uL   RBC 4.22 4.22 - 5.81 MIL/uL   Hemoglobin 11.1 (L) 13.0 - 17.0 g/dL   HCT 35.3 (L) 39.0 - 52.0 %   MCV 83.6 80.0 - 100.0 fL   MCH 26.3 26.0 - 34.0 pg   MCHC 31.4 30.0 - 36.0 g/dL   RDW 13.6 11.5 - 15.5 %   Platelets 329 150 - 400 K/uL   nRBC 0.0 0.0 - 0.2 %  Comprehensive metabolic panel  Result  Value Ref Range   Sodium 134 (L) 135 - 145 mmol/L   Potassium 3.8 3.5 - 5.1 mmol/L   Chloride 97 (L) 98 - 111 mmol/L   CO2 26 22 - 32 mmol/L   Glucose, Bld 139 (H) 70 - 99 mg/dL   BUN 23 8 - 23 mg/dL   Creatinine, Ser 0.88 0.61 - 1.24 mg/dL   Calcium 9.4 8.9 - 10.3 mg/dL   Total Protein 7.7 6.5 - 8.1 g/dL   Albumin 3.9 3.5 - 5.0 g/dL   AST 18 15 - 41 U/L   ALT 14 0 - 44 U/L   Alkaline Phosphatase 102 38 - 126 U/L   Total Bilirubin 0.8 0.3 - 1.2 mg/dL   GFR calc non Af Amer >60 >60 mL/min   GFR calc Af Amer >60 >60 mL/min   Anion gap 11 5 - 15  Hemoglobin A1c  Result Value Ref Range   Hgb A1c MFr Bld 7.6 (H) 4.8 - 5.6 %   Mean Plasma Glucose 171.42 mg/dL  Glucose, capillary  Result Value Ref Range   Glucose-Capillary 164 (H) 70 - 99 mg/dL  CBG monitoring, ED  Result Value Ref Range   Glucose-Capillary 127 (H) 70 - 99 mg/dL  ECHOCARDIOGRAM COMPLETE  Result Value Ref Range   Weight 3,421.54 oz   Height 72 in   BP 129/70 mmHg   Murvin Natal, MD  [facility]   09/02/2018 10:43 PM How to contact the [facility] Attending or Consulting provider [place] or covering provider during after hours [place], for this patient?  1. Check the care team in [facility] and look for a) attending/consulting TR[facility] provider listed and b) the [facility] team listed 2. Log into [email or web address] and use [place]'s universal password to access. If you do not have the password, please contact the hospital operator. 3. Locate the [facility] provider you are looking for under [facility] and page to a number that you can be directly reached. 4. If you still have difficulty reaching the provider, please page the [facility] (Director on Call) for the Hospitalists listed on amion for assistance.

## 2018-09-03 NOTE — Progress Notes (Signed)
Echocardiogram 2D Echocardiogram has been performed.  Matilde Bash 09/03/2018, 3:18 PM

## 2018-09-03 NOTE — ED Notes (Signed)
Patient unable to lie flat for scan at this time

## 2018-09-03 NOTE — ED Notes (Signed)
Pt states his breathing is feeling easier now

## 2018-09-03 NOTE — ED Notes (Signed)
hospitalist in room  

## 2018-09-04 ENCOUNTER — Inpatient Hospital Stay (HOSPITAL_COMMUNITY): Payer: Medicare Other

## 2018-09-04 LAB — BASIC METABOLIC PANEL WITH GFR
Anion gap: 9 (ref 5–15)
BUN: 30 mg/dL — ABNORMAL HIGH (ref 8–23)
CO2: 26 mmol/L (ref 22–32)
Calcium: 8.7 mg/dL — ABNORMAL LOW (ref 8.9–10.3)
Chloride: 97 mmol/L — ABNORMAL LOW (ref 98–111)
Creatinine, Ser: 1.27 mg/dL — ABNORMAL HIGH (ref 0.61–1.24)
GFR calc Af Amer: >60 mL/min
GFR calc non Af Amer: 52 mL/min — ABNORMAL LOW
Glucose, Bld: 123 mg/dL — ABNORMAL HIGH (ref 70–99)
Potassium: 3.6 mmol/L (ref 3.5–5.1)
Sodium: 132 mmol/L — ABNORMAL LOW (ref 135–145)

## 2018-09-04 LAB — CBC
HCT: 29.9 % — ABNORMAL LOW (ref 39.0–52.0)
Hemoglobin: 9.5 g/dL — ABNORMAL LOW (ref 13.0–17.0)
MCH: 26.8 pg (ref 26.0–34.0)
MCHC: 31.8 g/dL (ref 30.0–36.0)
MCV: 84.5 fL (ref 80.0–100.0)
Platelets: 281 K/uL (ref 150–400)
RBC: 3.54 MIL/uL — ABNORMAL LOW (ref 4.22–5.81)
RDW: 13.9 % (ref 11.5–15.5)
WBC: 7.9 K/uL (ref 4.0–10.5)
nRBC: 0 % (ref 0.0–0.2)

## 2018-09-04 LAB — GLUCOSE, CAPILLARY
Glucose-Capillary: 119 mg/dL — ABNORMAL HIGH (ref 70–99)
Glucose-Capillary: 161 mg/dL — ABNORMAL HIGH (ref 70–99)
Glucose-Capillary: 136 mg/dL — ABNORMAL HIGH (ref 70–99)
Glucose-Capillary: 159 mg/dL — ABNORMAL HIGH (ref 70–99)

## 2018-09-04 LAB — HEPARIN LEVEL (UNFRACTIONATED): Heparin Unfractionated: 0.61 [IU]/mL (ref 0.30–0.70)

## 2018-09-04 MED ORDER — SODIUM CHLORIDE 0.9 % IV BOLUS
500.0000 mL | Freq: Once | INTRAVENOUS | Status: AC
  Administered 2018-09-04: 500 mL via INTRAVENOUS

## 2018-09-04 NOTE — Plan of Care (Signed)
Notified MD, patient tachyphrenic and very agitated. Requested MD at the bedside. See updated orders.

## 2018-09-04 NOTE — Progress Notes (Addendum)
PROGRESS NOTE    Jerry Burns  FWY:[id number]  DOB: 12-06-1933  DOA: 09/02/2018 PCP: Kathyrn Drown, MD   Brief Admission Hx: 85 y.o. male, history of hypertension, diabetes mellitus type 2, hyperlipidemia, coronary artery disease status post stent to the proximal LAD,Came to hospital with worsening shortness of breath.  He is being treated for a presumed PE. He was found to have a DVT.  Pt could not tolerate CT chest exam due to severe SOB.    MDM/Assessment & Plan:   1. Acute respiratory distress - presumed PE.  Continue supplemental oxygen.  He is improving but not at baseline.   2. Presumed acute PE - unfortunately patient could not tolerate CT chest due to severe SOB and unable to lie recumbent, he has been empirically started on heparin drip and is feeling better.   3. Acute diastolic CHF - he has been treated with lasix and has diuresed.  His creatinine has bumped from diuresis.  DC lasix.   4. AKI - secondary to IV lasix diuresis.  Hold lisinopril. DC lasix.  5. CAD - s/p stent x 1.  Continue carvedilol, lipitor and aspirin.  6. Essential hypertension - controlled.   7. DVT right leg - found on doppler US.  Treating as above.  Plan to transition to apixaban 2/10.    DVT prophylaxis: heparin infusion  Code Status: Full  Family Communication: patient  Disposition Plan: home in 1-2 days    Consultants:  Pharm D  Procedures: (echocardiogram 09/03/18) IMPRESSIONS  1. The left ventricle has normal systolic function of 27-74%. The cavity size was normal. There is no increased left ventricular wall thickness. Echo evidence of impaired diastolic relaxation Normal left ventricular filling pressures.  2. The right ventricle has normal systolic function. The cavity was normal. There is no increase in right ventricular wall thickness.  3. Left atrial size was mildly dilated.  4. The mitral valve is normal in structure. No evidence of mitral valve stenosis.  5. The tricuspid valve  is normal in structure.  6. The aortic valve is tricuspid.  7. The aortic root is normal in size and structure.  Antimicrobials:  n/a   Subjective: Pt says that his SOB is getting better but not at baseline.  Supplemental oxygen is helping.   Objective: Vitals:   09/03/18 1100 09/03/18 1321 09/03/18 2129 09/04/18 0654  BP:  129/70 (!) 100/45 138/61  Pulse:  77 72 (!) 56  Resp:   18   Temp:  97.9 F (36.6 C) 98.1 F (36.7 C) 98.1 F (36.7 C)  TempSrc:  Oral Oral Oral  SpO2:  99% 95% 96%  Weight: 97 kg     Height: 6' (1.829 m)       Intake/Output Summary (Last 24 hours) at 09/04/2018 1206 Last data filed at 09/04/2018 0930 Gross per 24 hour  Intake 1270.59 ml  Output 600 ml  Net 670.59 ml   Filed Weights   09/02/18 2139 09/03/18 1100  Weight: 97 kg 97 kg     REVIEW OF SYSTEMS  As per history otherwise all reviewed and reported negative  Exam:  General exam: awake, alert, NAD.   Respiratory system: diminished BS RLL. No increased work of breathing. Cardiovascular system: S1 & S2 heard. No JVD, murmurs, gallops, clicks or pedal edema. Gastrointestinal system: Abdomen is nondistended, soft and nontender. Normal bowel sounds heard. Central nervous system: Alert and oriented. No focal neurological deficits. Extremities: no CCE.  Data Reviewed: Basic Metabolic Panel:  Recent Labs  Lab 09/02/18 1231 09/03/18 0544 09/04/18 0445  NA 137 134* 132*  K 4.8 3.8 3.6  CL 101 97* 97*  CO2 25 26 26   GLUCOSE 137* 139* 123*  BUN 24* 23 30*  CREATININE 0.92 0.88 1.27*  CALCIUM 9.5 9.4 8.7*   Liver Function Tests: Recent Labs  Lab 09/02/18 1231 09/03/18 0544  AST 18 18  ALT 15 14  ALKPHOS 99 102  BILITOT 0.5 0.8  PROT 7.7 7.7  ALBUMIN 3.8 3.9   No results for input(s): LIPASE, AMYLASE in the last 168 hours. No results for input(s): AMMONIA in the last 168 hours. CBC: Recent Labs  Lab 09/02/18 1051 09/02/18 1231 09/03/18 0544 09/04/18 0445  WBC  --  8.5  8.3 7.9  NEUTROABS  --  5.7  --   --   HGB 7.7* 11.0* 11.1* 9.5*  HCT  --  35.2* 35.3* 29.9*  MCV  --  85.4 83.6 84.5  PLT  --  315 329 281   Cardiac Enzymes: Recent Labs  Lab 09/02/18 2347  TROPONINI <0.03   CBG (last 3)  Recent Labs    09/03/18 2129 09/04/18 0741 09/04/18 1153  GLUCAP 140* 119* 161*   No results found for this or any previous visit (from the past 240 hour(s)).   Studies: Dg Chest 2 View  Result Date: 09/02/2018 CLINICAL DATA:  Shortness of breath and cough EXAM: CHEST - 2 VIEW COMPARISON:  02/02/2017 FINDINGS: Cardiac shadow is stable. The lungs are well aerated bilaterally. Bilateral pleural effusions are noted left greater than right. Degenerative changes of the thoracic spine are noted. IMPRESSION: Bilateral pleural effusions left greater than right. Electronically Signed   By: Inez Catalina M.D.   On: 09/02/2018 12:20   US Venous Img Lower Bilateral  Result Date: 09/03/2018 CLINICAL DATA:  85 year old male with bilateral lower extremity edema and shortness of breath for the past 2 weeks. Elevated D-dimer. EXAM: BILATERAL LOWER EXTREMITY VENOUS DOPPLER ULTRASOUND TECHNIQUE: Gray-scale sonography with graded compression, as well as color Doppler and duplex ultrasound were performed to evaluate the lower extremity deep venous systems from the level of the common femoral vein and including the common femoral, femoral, profunda femoral, popliteal and calf veins including the posterior tibial, peroneal and gastrocnemius veins when visible. The superficial great saphenous vein was also interrogated. Spectral Doppler was utilized to evaluate flow at rest and with distal augmentation maneuvers in the common femoral, femoral and popliteal veins. COMPARISON:  None. FINDINGS: RIGHT LOWER EXTREMITY Common Femoral Vein: No evidence of thrombus. Normal compressibility, respiratory phasicity and response to augmentation. Saphenofemoral Junction: No evidence of thrombus. Normal  compressibility and flow on color Doppler imaging. Profunda Femoral Vein: No evidence of thrombus. Normal compressibility and flow on color Doppler imaging. Femoral Vein: No evidence of thrombus. Normal compressibility, respiratory phasicity and response to augmentation. Popliteal Vein: No evidence of thrombus. Normal compressibility, respiratory phasicity and response to augmentation. Calf Veins: Positive color flow on color Doppler evaluation. Possible nonocclusive thrombus visualized within a short segment of the posterior tibial vein. Superficial Great Saphenous Vein: No evidence of thrombus. Normal compressibility. Venous Reflux:  None. Other Findings:  None. LEFT LOWER EXTREMITY Common Femoral Vein: No evidence of thrombus. Normal compressibility, respiratory phasicity and response to augmentation. Saphenofemoral Junction: No evidence of thrombus. Normal compressibility and flow on color Doppler imaging. Profunda Femoral Vein: No evidence of thrombus. Normal compressibility and flow on color Doppler imaging. Femoral Vein: No evidence of thrombus. Normal compressibility,  respiratory phasicity and response to augmentation. Popliteal Vein: No evidence of thrombus. Normal compressibility, respiratory phasicity and response to augmentation. Calf Veins: No evidence of thrombus. Normal compressibility and flow on color Doppler imaging. Superficial Great Saphenous Vein: No evidence of thrombus. Normal compressibility. Venous Reflux:  None. Other Findings:  None. IMPRESSION: 1. Possible nonocclusive thrombus of indeterminate acuity within 1 of the paired posterior tibial veins in the right calf. 2. Otherwise, no evidence of acute deep or superficial venous thrombosis in either lower extremity. Electronically Signed   By: Jacqulynn Cadet M.D.   On: 09/03/2018 10:41     Scheduled Meds: . amLODipine  10 mg Oral Daily  . aspirin EC  81 mg Oral Daily  . atorvastatin  80 mg Oral Daily  . brimonidine  1 drop Left  Eye BID   And  . timolol  1 drop Left Eye BID  . carvedilol  12.5 mg Oral BID WC  . dutasteride  0.5 mg Oral Daily  . insulin aspart  0-9 Units Subcutaneous TID WC  . insulin glargine  17 Units Subcutaneous QHS  . pantoprazole  40 mg Oral QAC supper  . sodium chloride flush  3 mL Intravenous Q12H   Continuous Infusions: . sodium chloride    . heparin 2,000 Units/hr (09/04/18 0232)    Active Problems:   Normochromic normocytic anemia   Acute diastolic CHF (congestive heart failure) (HCC)   Elevated d-dimer   Leg swelling   Peripheral edema  Time spent:   Irwin Brakeman, MD [facility] 09/04/2018, 12:06 PM    LOS: 1 day  How to contact the [facility] Attending or Consulting provider [place] or covering provider during after hours [place], for this patient?  1. Check the care team in [facility] and look for a) attending/consulting [facility] provider listed and b) the [facility] team listed 2. Log into [email or web address] and use [place]'s universal password to access. If you do not have the password, please contact the hospital operator. 3. Locate the [facility] provider you are looking for under [facility] and page to a number that you can be directly reached. 4. If you still have difficulty reaching the provider, please page the [facility] (Director on Call) for the Hospitalists listed on amion for assistance.

## 2018-09-04 NOTE — Progress Notes (Signed)
ANTICOAGULATION CONSULT NOTE -  Pharmacy Consult for Heparin Indication: Pulmonary embolism  Allergies  Allergen Reactions  . Adhesive [Tape]     bandaids cause blisters after leaving on for 24 hours   . Sulfa Antibiotics Other (See Comments)    "was terrible" "eye went crazy"  . Latex Other (See Comments)    blisters    Patient Measurements: Height: 6' (182.9 cm) Weight: 213 lb 13.5 oz (97 kg) IBW/kg (Calculated) : 77.6 HEPARIN DW (KG): 97  Vital Signs: Temp: 98.1 F (36.7 C) (02/09 0654) Temp Source: Oral (02/09 0654) BP: 138/61 (02/09 0654) Pulse Rate: 56 (02/09 0654)  Labs: Recent Labs    09/02/18 1231 09/02/18 2347 09/03/18 0544 09/03/18 1053 09/03/18 2020 09/04/18 0445  HGB 11.0*  --  11.1*  --   --  9.5*  HCT 35.2*  --  35.3*  --   --  29.9*  PLT 315  --  329  --   --  281  APTT  --  41*  --   --   --   --   LABPROT  --  13.4  --   --   --   --   INR  --  1.03  --   --   --   --   HEPARINUNFRC  --   --   --  <0.10* 0.40 0.61  CREATININE 0.92  --  0.88  --   --  1.27*  TROPONINI  --  <0.03  --   --   --   --    Estimated Creatinine Clearance: 54.2 mL/min (A) (by C-G formula based on SCr of 1.27 mg/dL (H)).  Medical History: Past Medical History:  Diagnosis Date  . Anginal pain ([place])   . Arthritis    Bilateral knee  . Coronary artery disease   . Diabetes mellitus    insulin dependent   . Hyperlipidemia   . Hypertension   . Progressive angina ([place]) 03/2015   3.0x12 mm Resolute DES to pLAD, Angiosculpt scoring balloon to dLAD  . Stroke [facility])     Medications:   Assessment: 85 yo male presented to the ED for SOB which has been progressive over past several months and worsening significantly x several days. Chest x-ray shows bilateral pleural effusions with concern for underlying DVT/pulmonary emboli. D-dimer is elevated. Pharmacy has been consulted for IV heparin dosing.  Heparin level remains therapeutic.  Goal of Therapy:  Heparin level  0.3-0.7 units/ml Monitor platelets by anticoagulation protocol: Yes   Plan:  Continue Heparin infusion at 2000 units/hr Heparin level daily while on heparin Monitor for S/S of bleeding F/U transition to po tx  Isac Sarna, BS [place], BCPS Clinical Pharmacist Pager [phone] 09/04/2018,9:03 AM

## 2018-09-05 LAB — HEPARIN LEVEL (UNFRACTIONATED): HEPARIN UNFRACTIONATED: 0.65 [IU]/mL (ref 0.30–0.70)

## 2018-09-05 LAB — CBC
HEMATOCRIT: 31.1 % — AB (ref 39.0–52.0)
Hemoglobin: 9.7 g/dL — ABNORMAL LOW (ref 13.0–17.0)
MCH: 26.4 pg (ref 26.0–34.0)
MCHC: 31.2 g/dL (ref 30.0–36.0)
MCV: 84.5 fL (ref 80.0–100.0)
Platelets: 285 10*3/uL (ref 150–400)
RBC: 3.68 MIL/uL — ABNORMAL LOW (ref 4.22–5.81)
RDW: 13.6 % (ref 11.5–15.5)
WBC: 7.1 10*3/uL (ref 4.0–10.5)
nRBC: 0 % (ref 0.0–0.2)

## 2018-09-05 LAB — GLUCOSE, CAPILLARY
Glucose-Capillary: 144 mg/dL — ABNORMAL HIGH (ref 70–99)
Glucose-Capillary: 111 mg/dL — ABNORMAL HIGH (ref 70–99)

## 2018-09-05 IMAGING — DX DG CHEST 2V
2 series · 2 of 2 positions shown · non-contrast
Comparison: Chest x-ray of 08/09/2014

CLINICAL DATA: Abnormal stress test, pre cardiac catheterization

EXAM:
CHEST  2 VIEW

[chest pa]
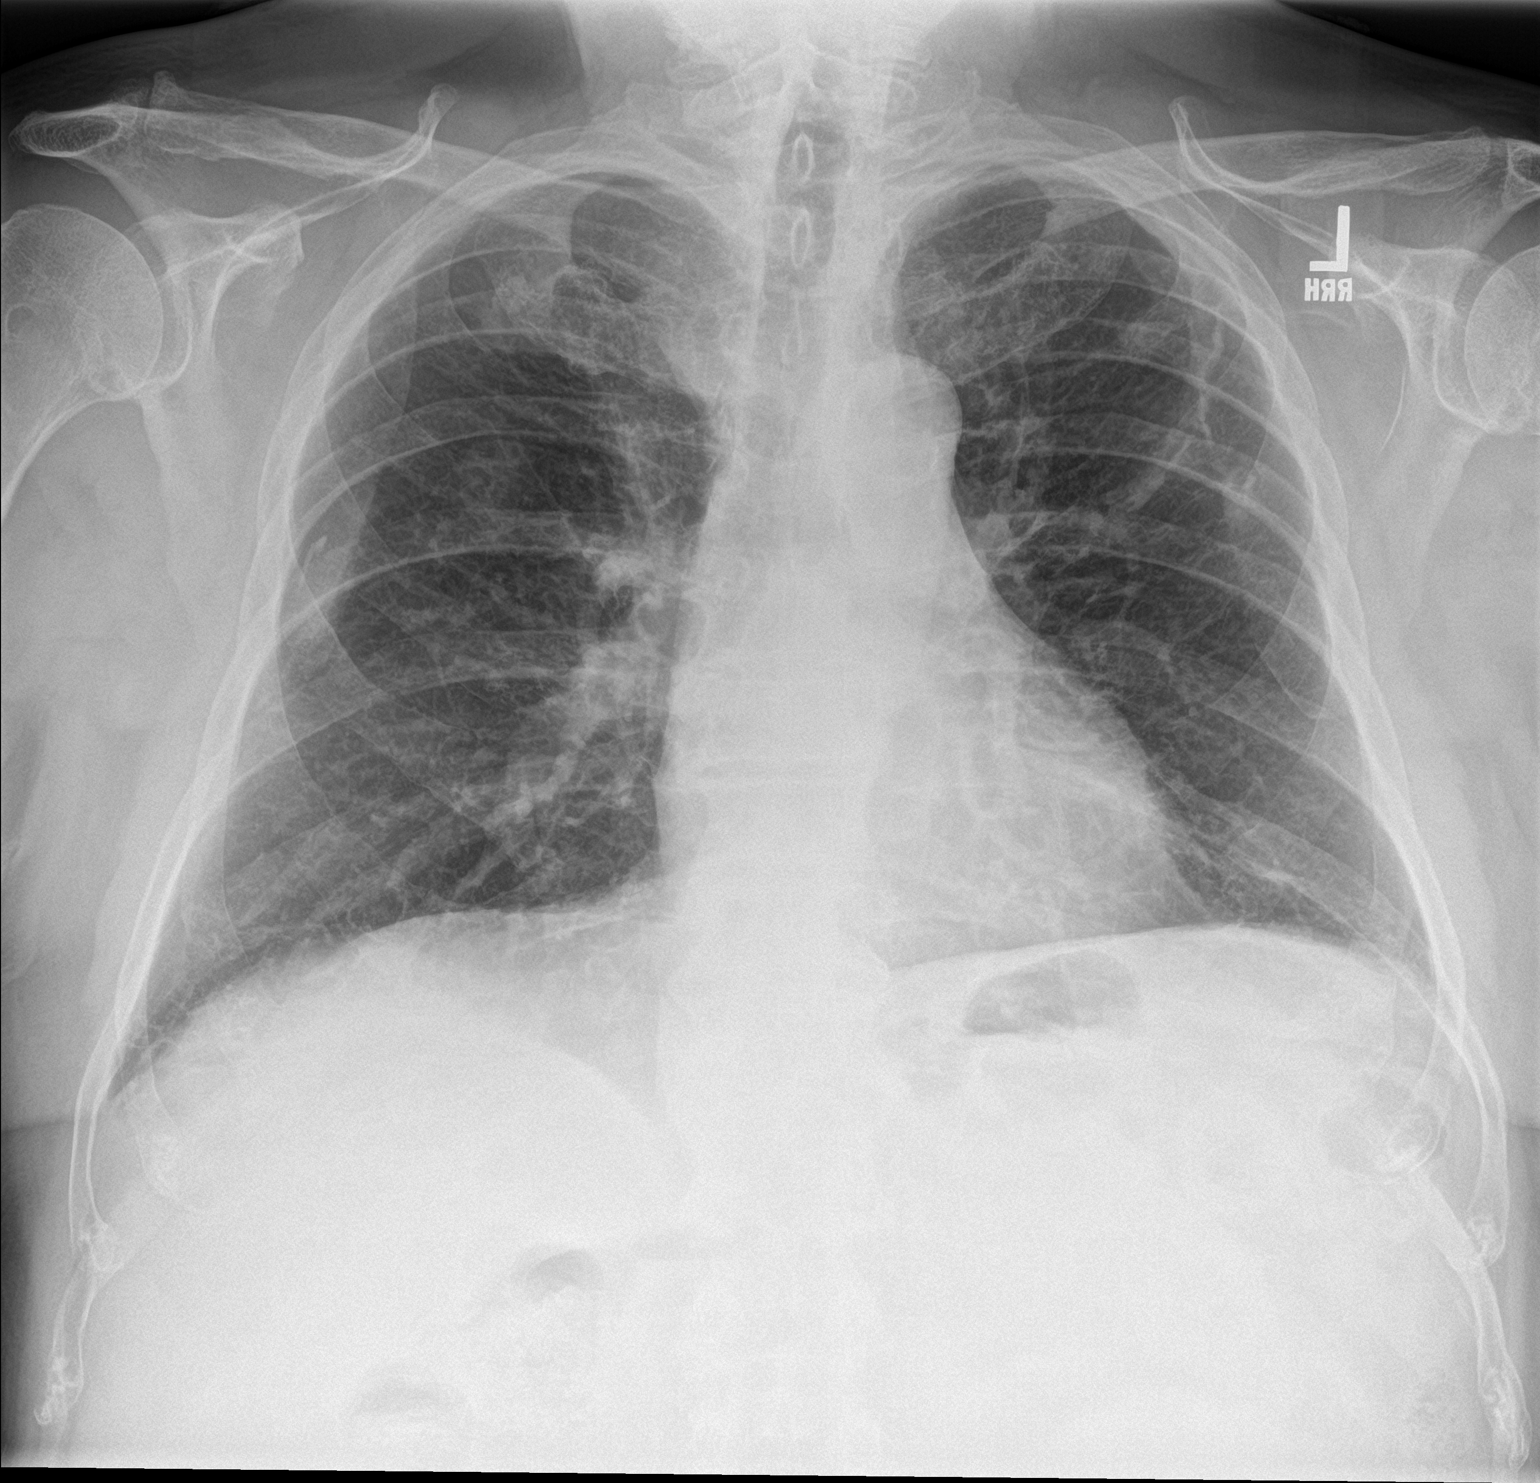

[chest lat]
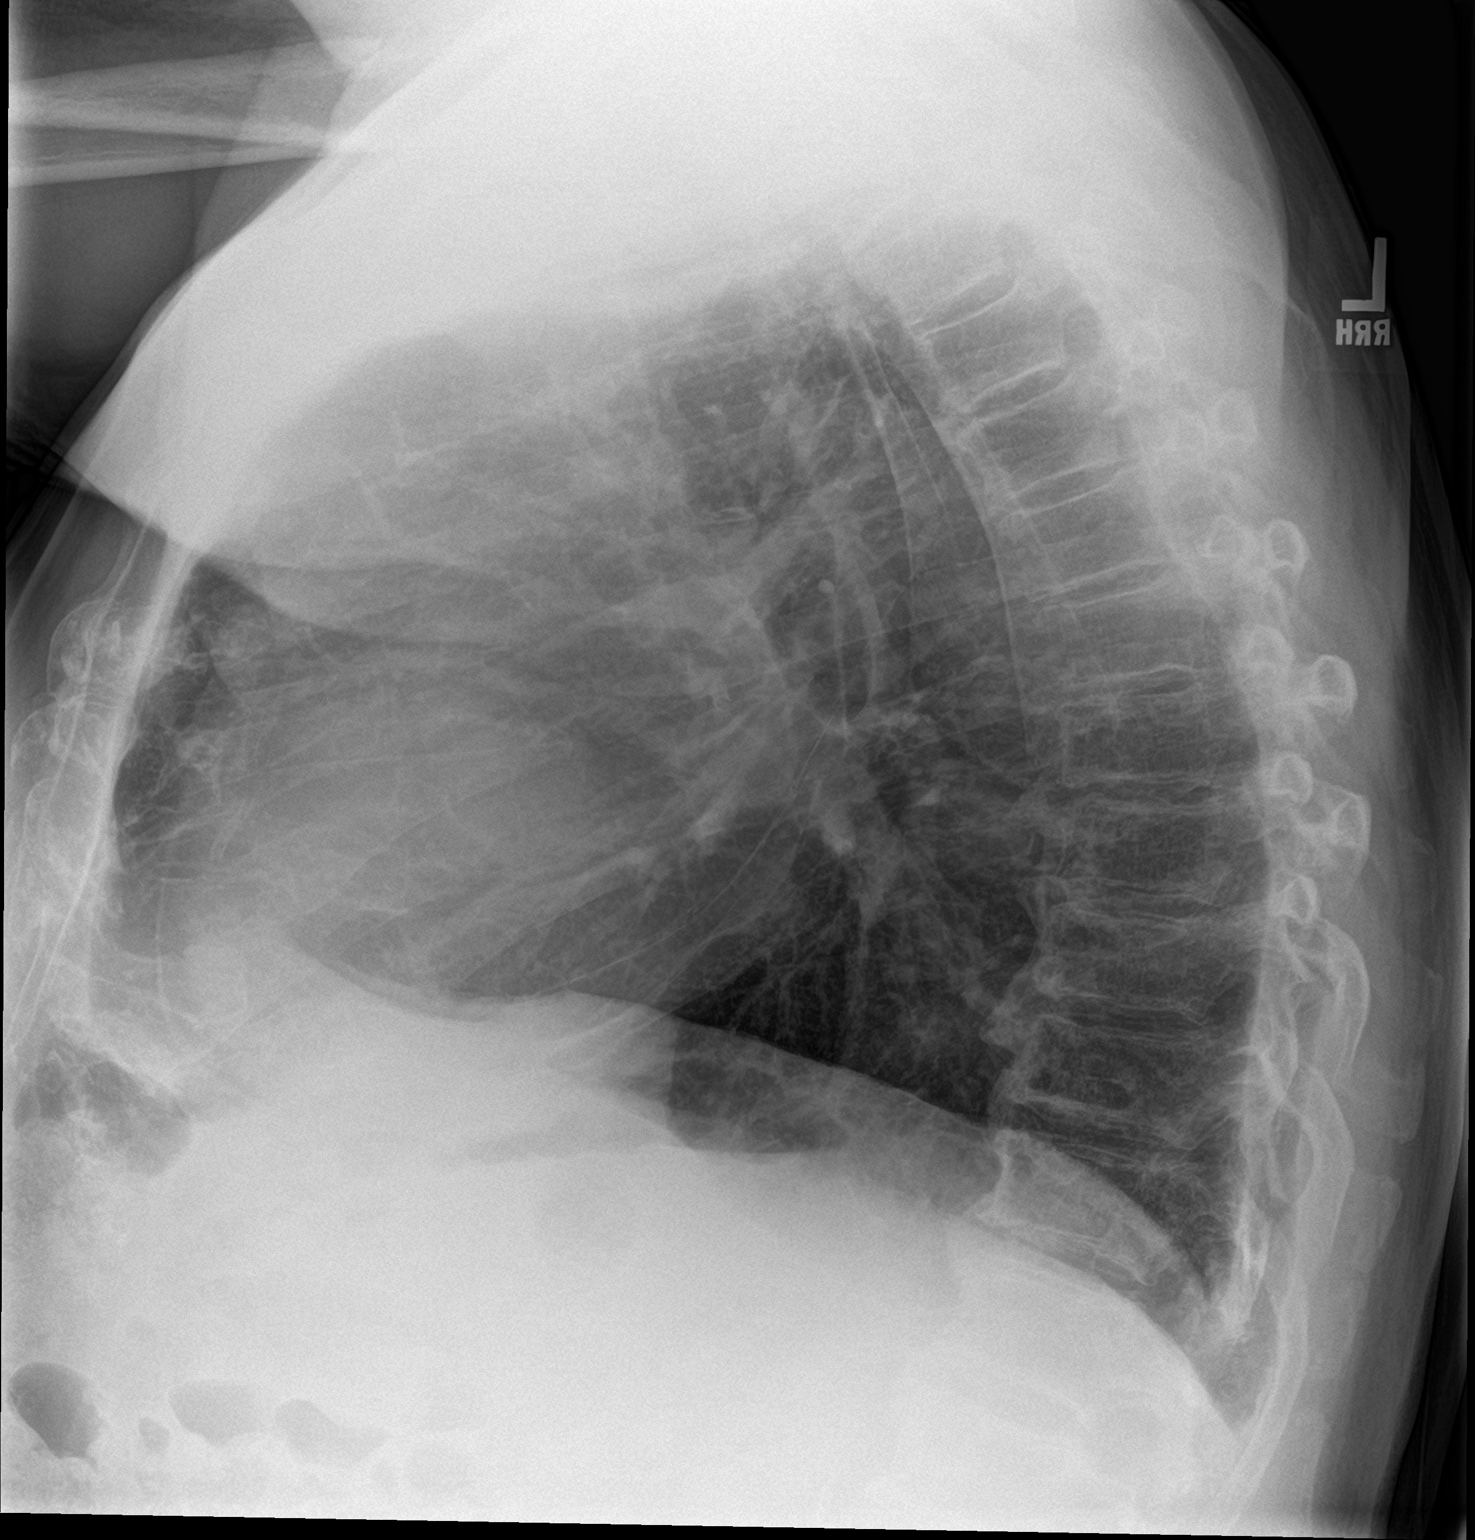

[2 of 2 positions shown; findings below may reference images not displayed]

FINDINGS: The lungs are somewhat hyperaerated. Vague opacity bilaterally
appear chronic and may represent pleural thickening or pleural
plaque formation. Mediastinal and hilar contours are unremarkable.
The heart is within normal limits in size. There are degenerative
change in the mid to lower thoracic spine.
IMPRESSION: 1. No active cardiopulmonary disease.
2. Foci of pleural thickening or pleural plaque formation
bilaterally which appear unchanged.
3. Slight hyper aeration.

## 2018-09-05 MED ORDER — AZITHROMYCIN 250 MG PO TABS: 250.0000 mg | ORAL_TABLET | Freq: Every day | ORAL | 0 refills | Status: AC

## 2018-09-05 MED ORDER — ELIQUIS 5 MG VTE STARTER PACK: ORAL_TABLET | ORAL | 0 refills | Status: DC

## 2018-09-05 MED ORDER — QUINAPRIL HCL 10 MG PO TABS: 10.0000 mg | ORAL_TABLET | Freq: Every day | ORAL | 4 refills | Status: DC

## 2018-09-05 MED ORDER — APIXABAN 5 MG PO TABS: 5.0000 mg | ORAL_TABLET | Freq: Two times a day (BID) | ORAL | Status: DC

## 2018-09-05 MED ORDER — APIXABAN 5 MG PO TABS
10.0000 mg | ORAL_TABLET | Freq: Two times a day (BID) | ORAL | Status: DC
  Administered 2018-09-05: 10 mg via ORAL
  Filled 2018-09-05: qty 2

## 2018-09-05 MED ORDER — AZITHROMYCIN 250 MG PO TABS
500.0000 mg | ORAL_TABLET | Freq: Once | ORAL | Status: AC
  Administered 2018-09-05: 500 mg via ORAL
  Filled 2018-09-05: qty 2

## 2018-09-05 MED ORDER — DEXTROMETHORPHAN POLISTIREX ER 30 MG/5ML PO SUER: 30.0000 mg | Freq: Two times a day (BID) | ORAL | 0 refills | Status: DC | PRN

## 2018-09-05 NOTE — Progress Notes (Signed)
ANTICOAGULATION CONSULT NOTE -  Pharmacy Consult for Heparin-->eliquis Indication: Pulmonary embolism  Allergies  Allergen Reactions  . Adhesive [Tape]     bandaids cause blisters after leaving on for 24 hours   . Sulfa Antibiotics Other (See Comments)    "was terrible" "eye went crazy"  . Latex Other (See Comments)    blisters    Patient Measurements: Height: 6' (182.9 cm) Weight: 213 lb 13.5 oz (97 kg) IBW/kg (Calculated) : 77.6 HEPARIN DW (KG): 97  Vital Signs: Temp: 98.2 F (36.8 C) (02/10 0642) Temp Source: Oral (02/10 0642) BP: 132/67 (02/10 8270) Pulse Rate: 72 (02/10 0642)  Labs: Recent Labs    09/02/18 1231 09/02/18 2347 09/03/18 0544  09/03/18 2020 09/04/18 0445 09/05/18 0540  HGB 11.0*  --  11.1*  --   --  9.5* 9.7*  HCT 35.2*  --  35.3*  --   --  29.9* 31.1*  PLT 315  --  329  --   --  281 285  APTT  --  41*  --   --   --   --   --   LABPROT  --  13.4  --   --   --   --   --   INR  --  1.03  --   --   --   --   --   HEPARINUNFRC  --   --   --    < > 0.40 0.61 0.65  CREATININE 0.92  --  0.88  --   --  1.27*  --   TROPONINI  --  <0.03  --   --   --   --   --    < > = values in this interval not displayed.   Estimated Creatinine Clearance: 54.2 mL/min (A) (by C-G formula based on SCr of 1.27 mg/dL (H)).  Medical History: Past Medical History:  Diagnosis Date  . Anginal pain ([place])   . Arthritis    Bilateral knee  . Coronary artery disease   . Diabetes mellitus    insulin dependent   . Hyperlipidemia   . Hypertension   . Progressive angina ([place]) 03/2015   3.0x12 mm Resolute DES to pLAD, Angiosculpt scoring balloon to dLAD  . [facility]roke [facility])     Medications:   Assessment: 85 yo male presented to the ED for SOB which has been progressive over past several months and worsening significantly x several days. Chest x-ray shows bilateral pleural effusions with concern for underlying DVT/pulmonary emboli. D-dimer is elevated. Pharmacy has been  consulted for IV heparin dosing.  Heparin level remains therapeutic. Now plan to transition to eliquis  Goal of Therapy:  Heparin level 0.3-0.7 units/ml Monitor platelets by anticoagulation protocol: Yes   Plan:  D/C heparin and start eliquis 10mg  po BID x 7 days, then 5mg  po bid Monitor for S/S of bleeding Educate on eliquis  Isac Sarna, BS [place]ena [place], BCPS Clinical Pharmacist Pager [phone] 09/05/2018,8:47 AM

## 2018-09-05 NOTE — Discharge Summary (Signed)
Physician Discharge Summary  FEDERICK LEVENE JHE:[id number] DOB: 1934-01-11 DOA: 09/02/2018  PCP: Kathyrn Drown, MD Cardiologist: Bronson Ing   Admit date: 09/02/2018 Discharge date: 09/05/2018  Admitted From: Home  Disposition: Home   Recommendations for Outpatient Follow-up:  1. Follow up with PCP in 1 weeks 2. Follow up with cardiology in 1-2 weeks 3. Anticoagulate for 3-6 months for PE/DVT 4. Please obtain BMP/CBC in 1 week to follow up.  5. Monitor for bleeding complications.   Discharge Condition: STABLE   CODE STATUS: FULL    Brief Hospitalization Summary: Please see all hospital notes, images, labs for full details of the hospitalization. Dr. Toney Sang HPI: Montoya Brandel  is a 85 y.o. male, history of hypertension, diabetes mellitus type 2, hyperlipidemia, coronary artery disease status post stent to the proximal LAD,Came to hospital with worsening shortness of breath.Patient says that for the past 2 weeks shortness of breath has gotten worse.  And he is too dyspneic to lie flat and had to sit up all night.  He admits to coughing up clear phlegm.  No fever but has been feeling cold. Denies nausea vomiting or diarrhea. Denies chest pain.  He went to his PCP today where fingerstick hemoglobin showed hemoglobin of 7.7 whereas CBC showed hemoglobin around 11.  In the ED patient was given Lasix 40 mg IV with excellent diuretic response.  He was found to have bilateral lower extremity swelling left more than right, concerning for venous thromboembolism.  CTA chest ordered but could not be done as patient unable to lie flat.  IV heparin started in the ED empirically for presumed  PE.  Brief Admission Hx: 85 y.o.male,history of hypertension, diabetes mellitus type 2, hyperlipidemia, coronary artery disease status post stent to theproximal LAD,Came to hospital with worsening shortness of breath.  He is being treated for a presumed PE. He was found to have a DVT.  Pt could not tolerate CT chest  exam due to severe SOB.    MDM/Assessment & Plan:   1. Acute respiratory distress - Resolved now.  He is on room air and ambulating in the room. He feels ready to go home and will follow up with his PCP and cardiologist.    2. Presumed acute PE - unfortunately patient could not tolerate CT chest due to severe SOB and unable to lie recumbent, he was empirically started on heparin drip and is feeling better and improved.  Doppler US of extremities suggesting DVT right leg.  He has been transitioned over to oral apixaban with instructions to take 10 mg BID x 7 days followed by 5 mg BID to take for 3-6 months. Pt to follow up with PCP to decide length of therapy. PharmD counseled patient on apixaban and patient verbalized understanding.  Pt was also given written instructions and guidance.   3. Acute diastolic CHF - he has been treated with lasix and has diuresed.  His creatinine has bumped from diuresis.  discontinued lasix.   4. AKI - secondary to IV lasix diuresis.  DC lasix.  5. CAD - s/p stent x 1.  Continue carvedilol, lipitor and aspirin.  6. Essential hypertension - controlled.   7. DVT right leg - found on doppler US.  Treating as above with apixaban.    8. URI with cough - delsym cough syrup and azithromycin ordered.   DVT prophylaxis: apixaban  Code Status: Full  Family Communication: patient  Disposition Plan: home with close outpatient follow up  Consultants:  Pharm D  Procedures: (echocardiogram 09/03/18) IMPRESSIONS 1. The left ventricle has normal systolic function of 44-31%. The cavity size was normal. There is no increased left ventricular wall thickness. Echo evidence of impaired diastolic relaxation Normal left ventricular filling pressures. 2. The right ventricle has normal systolic function. The cavity was normal. There is no increase in right ventricular wall thickness. 3. Left atrial size was mildly dilated. 4. The mitral valve is normal in structure. No  evidence of mitral valve stenosis. 5. The tricuspid valve is normal in structure. 6. The aortic valve is tricuspid. 7. The aortic root is normal in size and structure.  Antimicrobials:  Azithromycin  Discharge Diagnoses:  Active Problems:   Normochromic normocytic anemia   Acute diastolic CHF (congestive heart failure) (HCC)   Elevated d-dimer   Leg swelling   Peripheral edema  Discharge Instructions: Discharge Instructions    Call MD for:  extreme fatigue   Complete by:  As directed    Call MD for:  persistant dizziness or light-headedness   Complete by:  As directed    Call MD for:  persistant nausea and vomiting   Complete by:  As directed    Call MD for:  severe uncontrolled pain   Complete by:  As directed      Allergies as of 09/05/2018      Reactions   Adhesive [tape]    bandaids cause blisters after leaving on for 24 hours    Sulfa Antibiotics Other (See Comments)   "was terrible" "eye went crazy"   Latex Other (See Comments)   blisters      Medication List    TAKE these medications   amLODipine 10 MG tablet Commonly known as:  NORVASC TAKE (1) TABLET BY MOUTH ONCE DAILY. What changed:  See the new instructions.   aspirin EC 81 MG tablet Take 81 mg by mouth daily.   atorvastatin 80 MG tablet Commonly known as:  LIPITOR Take 1 tablet (80 mg total) by mouth daily.   azithromycin 250 MG tablet Commonly known as:  ZITHROMAX Take 1 tablet (250 mg total) by mouth daily for 4 days. Start taking on:  September 06, 2018   carvedilol 12.5 MG tablet Commonly known as:  COREG TAKE (1) TABLET BY MOUTH TWICE DAILY.   CERAVE Crea Apply 1 application topically daily as needed (dry skin).   COMBIGAN 0.2-0.5 % ophthalmic solution Generic drug:  brimonidine-timolol Place 1 drop into the left eye 2 (two) times daily.   dextromethorphan 30 MG/5ML liquid Commonly known as:  DELSYM Take 5 mLs (30 mg total) by mouth 2 (two) times daily as needed for cough.    dutasteride 0.5 MG capsule Commonly known as:  AVODART TAKE ONE CAPSULE BY MOUTH ONCE DAILY.   ELIQUIS DVT/PE STARTER PACK 5 MG Tabs Take as directed on package: start with two-5mg  tablets twice daily for 7 days. On day 8, switch to one-5mg  tablet twice daily.   erythromycin ophthalmic ointment Place 1 application into the left eye 2 (two) times daily.   Flaxseed Oil 1200 MG Caps Take 1,200 mg by mouth daily.   Insulin Glargine 100 UNIT/ML Solostar Pen Commonly known as:  LANTUS SOLOSTAR INJECT 22 UNITS SUBCUTANEOUSLY ONCE DAILY AT  10  PM What changed:    how much to take  how to take this  when to take this  additional instructions   metFORMIN 1000 MG tablet Commonly known as:  GLUCOPHAGE TAKE (1) TABLET BY MOUTH TWICE A DAY WITH A MEAL.  multivitamin tablet Take 1 tablet by mouth daily.   nitroGLYCERIN 0.4 MG SL tablet Commonly known as:  NITROSTAT PLACE 1 TAB UNDER TONGUE EVERY 5 MIN IF NEEDED FOR CHEST PAIN. MAY USE 3 TIMES.NO RELIEF CALL 911. What changed:  See the new instructions.   pantoprazole 40 MG tablet Commonly known as:  PROTONIX Take 40 mg by mouth. 45 mins   before supper   psyllium 58.6 % powder Commonly known as:  METAMUCIL Take 1 packet by mouth daily.   quinapril 10 MG tablet Commonly known as:  ACCUPRIL Take 1 tablet (10 mg total) by mouth daily. Start taking on:  September 09, 2018 What changed:  These instructions start on September 09, 2018. If you are unsure what to do until then, ask your doctor or other care provider.   REFRESH OP Place 1 drop into the left eye 2 (two) times daily.   sitaGLIPtin 100 MG tablet Commonly known as:  JANUVIA Take 1 tablet (100 mg total) by mouth daily.   tamsulosin 0.4 MG Caps capsule Commonly known as:  FLOMAX Take 1 capsule (0.4 mg total) by mouth daily.   Turmeric 500 MG Caps Take 500 mg by mouth daily.      Follow-up Information    Luking, Elayne Snare, MD. Schedule an appointment as soon as  possible for a visit in 1 week.   Specialty:  Family Medicine Why:  Hospital follow-up Contact information: [place] [address] [phone]        Herminio Commons, MD Follow up on 09/22/2018.   Specialty:  Cardiology Why:  2:30 Contact information: [place] [address] [phone]          Allergies  Allergen Reactions  . Adhesive [Tape]     bandaids cause blisters after leaving on for 24 hours   . Sulfa Antibiotics Other (See Comments)    "was terrible" "eye went crazy"  . Latex Other (See Comments)    blisters   Allergies as of 09/05/2018      Reactions   Adhesive [tape]    bandaids cause blisters after leaving on for 24 hours    Sulfa Antibiotics Other (See Comments)   "was terrible" "eye went crazy"   Latex Other (See Comments)   blisters      Medication List    TAKE these medications   amLODipine 10 MG tablet Commonly known as:  NORVASC TAKE (1) TABLET BY MOUTH ONCE DAILY. What changed:  See the new instructions.   aspirin EC 81 MG tablet Take 81 mg by mouth daily.   atorvastatin 80 MG tablet Commonly known as:  LIPITOR Take 1 tablet (80 mg total) by mouth daily.   azithromycin 250 MG tablet Commonly known as:  ZITHROMAX Take 1 tablet (250 mg total) by mouth daily for 4 days. Start taking on:  September 06, 2018   carvedilol 12.5 MG tablet Commonly known as:  COREG TAKE (1) TABLET BY MOUTH TWICE DAILY.   CERAVE Crea Apply 1 application topically daily as needed (dry skin).   COMBIGAN 0.2-0.5 % ophthalmic solution Generic drug:  brimonidine-timolol Place 1 drop into the left eye 2 (two) times daily.   dextromethorphan 30 MG/5ML liquid Commonly known as:  DELSYM Take 5 mLs (30 mg total) by mouth 2 (two) times daily as needed for cough.   dutasteride 0.5 MG capsule Commonly known as:  AVODART TAKE ONE CAPSULE BY MOUTH ONCE DAILY.   ELIQUIS DVT/PE  STARTER PACK 5 MG Tabs Take as directed on  package: start with two-5mg  tablets twice daily for 7 days. On day 8, switch to one-5mg  tablet twice daily.   erythromycin ophthalmic ointment Place 1 application into the left eye 2 (two) times daily.   Flaxseed Oil 1200 MG Caps Take 1,200 mg by mouth daily.   Insulin Glargine 100 UNIT/ML Solostar Pen Commonly known as:  LANTUS SOLOSTAR INJECT 22 UNITS SUBCUTANEOUSLY ONCE DAILY AT  10  PM What changed:    how much to take  how to take this  when to take this  additional instructions   metFORMIN 1000 MG tablet Commonly known as:  GLUCOPHAGE TAKE (1) TABLET BY MOUTH TWICE A DAY WITH A MEAL.   multivitamin tablet Take 1 tablet by mouth daily.   nitroGLYCERIN 0.4 MG SL tablet Commonly known as:  NITROSTAT PLACE 1 TAB UNDER TONGUE EVERY 5 MIN IF NEEDED FOR CHEST PAIN. MAY USE 3 TIMES.NO RELIEF CALL 911. What changed:  See the new instructions.   pantoprazole 40 MG tablet Commonly known as:  PROTONIX Take 40 mg by mouth. 45 mins   before supper   psyllium 58.6 % powder Commonly known as:  METAMUCIL Take 1 packet by mouth daily.   quinapril 10 MG tablet Commonly known as:  ACCUPRIL Take 1 tablet (10 mg total) by mouth daily. Start taking on:  September 09, 2018 What changed:  These instructions start on September 09, 2018. If you are unsure what to do until then, ask your doctor or other care provider.   REFRESH OP Place 1 drop into the left eye 2 (two) times daily.   sitaGLIPtin 100 MG tablet Commonly known as:  JANUVIA Take 1 tablet (100 mg total) by mouth daily.   tamsulosin 0.4 MG Caps capsule Commonly known as:  FLOMAX Take 1 capsule (0.4 mg total) by mouth daily.   Turmeric 500 MG Caps Take 500 mg by mouth daily.       Procedures/Studies: Dg Chest 2 View  Result Date: 09/04/2018 CLINICAL DATA:  Shortness of breath, bilateral pleural effusions, CHF EXAM: CHEST - 2 VIEW COMPARISON:  09/02/2018 FINDINGS: Moderate left and small right pleural effusions,  unchanged. No frank interstitial edema.  No pneumothorax. The heart is normal in size. Degenerative changes of the visualized thoracolumbar spine. IMPRESSION: Moderate left and small right pleural effusions, unchanged. No frank interstitial edema. Electronically Signed   By: Julian Hy M.D.   On: 09/04/2018 16:14   Dg Chest 2 View  Result Date: 09/02/2018 CLINICAL DATA:  Shortness of breath and cough EXAM: CHEST - 2 VIEW COMPARISON:  02/02/2017 FINDINGS: Cardiac shadow is stable. The lungs are well aerated bilaterally. Bilateral pleural effusions are noted left greater than right. Degenerative changes of the thoracic spine are noted. IMPRESSION: Bilateral pleural effusions left greater than right. Electronically Signed   By: Inez Catalina M.D.   On: 09/02/2018 12:20   US Venous Img Lower Bilateral  Result Date: 09/03/2018 CLINICAL DATA:  85 year old male with bilateral lower extremity edema and shortness of breath for the past 2 weeks. Elevated D-dimer. EXAM: BILATERAL LOWER EXTREMITY VENOUS DOPPLER ULTRASOUND TECHNIQUE: Gray-scale sonography with graded compression, as well as color Doppler and duplex ultrasound were performed to evaluate the lower extremity deep venous systems from the level of the common femoral vein and including the common femoral, femoral, profunda femoral, popliteal and calf veins including the posterior tibial, peroneal and gastrocnemius veins when visible. The superficial great saphenous vein  was also interrogated. Spectral Doppler was utilized to evaluate flow at rest and with distal augmentation maneuvers in the common femoral, femoral and popliteal veins. COMPARISON:  None. FINDINGS: RIGHT LOWER EXTREMITY Common Femoral Vein: No evidence of thrombus. Normal compressibility, respiratory phasicity and response to augmentation. Saphenofemoral Junction: No evidence of thrombus. Normal compressibility and flow on color Doppler imaging. Profunda Femoral Vein: No evidence of  thrombus. Normal compressibility and flow on color Doppler imaging. Femoral Vein: No evidence of thrombus. Normal compressibility, respiratory phasicity and response to augmentation. Popliteal Vein: No evidence of thrombus. Normal compressibility, respiratory phasicity and response to augmentation. Calf Veins: Positive color flow on color Doppler evaluation. Possible nonocclusive thrombus visualized within a short segment of the posterior tibial vein. Superficial Great Saphenous Vein: No evidence of thrombus. Normal compressibility. Venous Reflux:  None. Other Findings:  None. LEFT LOWER EXTREMITY Common Femoral Vein: No evidence of thrombus. Normal compressibility, respiratory phasicity and response to augmentation. Saphenofemoral Junction: No evidence of thrombus. Normal compressibility and flow on color Doppler imaging. Profunda Femoral Vein: No evidence of thrombus. Normal compressibility and flow on color Doppler imaging. Femoral Vein: No evidence of thrombus. Normal compressibility, respiratory phasicity and response to augmentation. Popliteal Vein: No evidence of thrombus. Normal compressibility, respiratory phasicity and response to augmentation. Calf Veins: No evidence of thrombus. Normal compressibility and flow on color Doppler imaging. Superficial Great Saphenous Vein: No evidence of thrombus. Normal compressibility. Venous Reflux:  None. Other Findings:  None. IMPRESSION: 1. Possible nonocclusive thrombus of indeterminate acuity within 1 of the paired posterior tibial veins in the right calf. 2. Otherwise, no evidence of acute deep or superficial venous thrombosis in either lower extremity. Electronically Signed   By: Jacqulynn Cadet M.D.   On: 09/03/2018 10:41     Subjective: The patient is ambulating in the room and he says he feels much better.  He would like to go home today.  He has no chest pain.  His shortness of breath is much better.  He is off supplemental oxygen.  Discharge  Exam: Vitals:   09/04/18 2252 09/05/18 0642  BP: 131/65 132/67  Pulse: 74 72  Resp: 18 17  Temp: 97.8 F (36.6 C) 98.2 F (36.8 C)  SpO2: 95% 95%   Vitals:   09/04/18 0654 09/04/18 1521 09/04/18 2252 09/05/18 0642  BP: 138/61 115/61 131/65 132/67  Pulse: (!) 56 69 74 72  Resp:  18 18 17   Temp: 98.1 F (36.7 C) 98.2 F (36.8 C) 97.8 F (36.6 C) 98.2 F (36.8 C)  TempSrc: Oral Oral Oral Oral  SpO2: 96% 95% 95% 95%  Weight:      Height:       General: Pt is alert, awake, not in acute distress Cardiovascular: Normal S1/S2 +, no rubs, no gallops Respiratory: CTA bilaterally, no wheezing, no rhonchi Abdominal: Soft, NT, ND, bowel sounds + Extremities: Trace pretibial edema bilateral lower extremities, no cyanosis   The results of significant diagnostics from this hospitalization (including imaging, microbiology, ancillary and laboratory) are listed below for reference.     Microbiology: No results found for this or any previous visit (from the past 240 hour(s)).   Labs: BNP (last 3 results) Recent Labs    09/02/18 2347  BNP 95.1   Basic Metabolic Panel: Recent Labs  Lab 09/02/18 1231 09/03/18 0544 09/04/18 0445  NA 137 134* 132*  K 4.8 3.8 3.6  CL 101 97* 97*  CO2 25 26 26   GLUCOSE 137* 139* 123*  BUN  24* 23 30*  CREATININE 0.92 0.88 1.27*  CALCIUM 9.5 9.4 8.7*   Liver Function Tests: Recent Labs  Lab 09/02/18 1231 09/03/18 0544  AST 18 18  ALT 15 14  ALKPHOS 99 102  BILITOT 0.5 0.8  PROT 7.7 7.7  ALBUMIN 3.8 3.9   No results for input(s): LIPASE, AMYLASE in the last 168 hours. No results for input(s): AMMONIA in the last 168 hours. CBC: Recent Labs  Lab 09/02/18 1051 09/02/18 1231 09/03/18 0544 09/04/18 0445 09/05/18 0540  WBC  --  8.5 8.3 7.9 7.1  NEUTROABS  --  5.7  --   --   --   HGB 7.7* 11.0* 11.1* 9.5* 9.7*  HCT  --  35.2* 35.3* 29.9* 31.1*  MCV  --  85.4 83.6 84.5 84.5  PLT  --  315 329 281 285   Cardiac Enzymes: Recent Labs   Lab 09/02/18 2347  TROPONINI <0.03   BNP: Invalid input(s): POCBNP CBG: Recent Labs  Lab 09/04/18 1153 09/04/18 1616 09/04/18 2248 09/05/18 0717 09/05/18 1121  GLUCAP 161* 136* 159* 144* 111*   D-Dimer Recent Labs    09/02/18 2347  DDIMER 4.68*   Hgb A1c Recent Labs    09/03/18 0544  HGBA1C 7.6*   Lipid Profile No results for input(s): CHOL, HDL, LDLCALC, TRIG, CHOLHDL, LDLDIRECT in the last 72 hours. Thyroid function studies No results for input(s): TSH, T4TOTAL, T3FREE, THYROIDAB in the last 72 hours.  Invalid input(s): FREET3 Anemia work up Recent Labs    09/02/18 1231  FERRITIN 75  TIBC 303  IRON 28*  RETICCTPCT 0.9   Urinalysis    Component Value Date/Time   COLORURINE YELLOW 09/02/2018 2350   APPEARANCEUR CLEAR 09/02/2018 2350   LABSPEC 1.017 09/02/2018 2350   PHURINE 5.0 09/02/2018 2350   GLUCOSEU NEGATIVE 09/02/2018 2350   HGBUR NEGATIVE 09/02/2018 2350   HGBUR trace-intact 01/12/2007 1307   BILIRUBINUR NEGATIVE 09/02/2018 2350   KETONESUR NEGATIVE 09/02/2018 2350   PROTEINUR 100 (A) 09/02/2018 2350   UROBILINOGEN 0.2 01/12/2007 1307   NITRITE NEGATIVE 09/02/2018 2350   LEUKOCYTESUR NEGATIVE 09/02/2018 2350   Sepsis Labs Invalid input(s): PROCALCITONIN,  WBC,  LACTICIDVEN Microbiology No results found for this or any previous visit (from the past 240 hour(s)).  Time coordinating discharge: 34 minutes   SIGNED:  Irwin Brakeman, MD  [facility] 09/05/2018, 12:21 PM How to contact the [facility] Attending or Consulting provider [place] or covering provider during after hours [place], for this patient?  1. Check the care team in [facility] and look for a) attending/consulting [facility] provider listed and b) the [facility] team listed 2. Log into [email or web address] and use [place]'s universal password to access. If you do not have the password, please contact the hospital operator. 3. Locate the [facility] provider you are looking for under [facility] and  page to a number that you can be directly reached. 4. If you still have difficulty reaching the provider, please page the [facility] (Director on Call) for the Hospitalists listed on amion for assistance.

## 2018-09-05 NOTE — Discharge Instructions (Signed)
IMPORTANT INFORMATION: PAY CLOSE ATTENTION   PHYSICIAN DISCHARGE INSTRUCTIONS  Follow with Primary care provider  Kathyrn Drown, MD  and other consultants as instructed your Hospitalist Physician  SEEK MEDICAL CARE OR RETURN TO EMERGENCY ROOM IF SYMPTOMS COME BACK, WORSEN OR NEW PROBLEM DEVELOPS.   Please note: You were cared for by a hospitalist during your hospital stay. Every effort will be made to forward records to your primary care provider.  You can request that your primary care provider send for your hospital records if they have not received them.  Once you are discharged, your primary care physician will handle any further medical issues. Please note that NO REFILLS for any discharge medications will be authorized once you are discharged, as it is imperative that you return to your primary care physician (or establish a relationship with a primary care physician if you do not have one) for your post hospital discharge needs so that they can reassess your need for medications and monitor your lab values.  Please get a complete blood count and chemistry panel checked by your Primary MD at your next visit, and again as instructed by your Primary MD.  Get Medicines reviewed and adjusted: Please take all your medications with you for your next visit with your Primary MD  Laboratory/radiological data: Please request your Primary MD to go over all hospital tests and procedure/radiological results at the follow up, please ask your primary care provider to get all Hospital records sent to his/her office.  In some cases, they will be blood work, cultures and biopsy results pending at the time of your discharge. Please request that your primary care provider follow up on these results.  If you are diabetic, please bring your blood sugar readings with you to your follow up appointment with primary care.    Please call and make your follow up appointments as soon as possible.    Also Note  the following: If you experience worsening of your admission symptoms, develop shortness of breath, life threatening emergency, suicidal or homicidal thoughts you must seek medical attention immediately by calling 911 or calling your MD immediately  if symptoms less severe.  You must read complete instructions/literature along with all the possible adverse reactions/side effects for all the Medicines you take and that have been prescribed to you. Take any new Medicines after you have completely understood and accpet all the possible adverse reactions/side effects.   Do not drive when taking Pain medications or sleeping medications (Benzodiazepines)  Do not take more than prescribed Pain, Sleep and Anxiety Medications. It is not advisable to combine anxiety,sleep and pain medications without talking with your primary care practitioner  Special Instructions: If you have smoked or chewed Tobacco  in the last 2 yrs please stop smoking, stop any regular Alcohol  and or any Recreational drug use.  Wear Seat belts while driving.

## 2018-09-05 NOTE — Progress Notes (Signed)
ANTICOAGULATION CONSULT NOTE -  Pharmacy Consult for Heparin Indication: Pulmonary embolism  Allergies  Allergen Reactions  . Adhesive [Tape]     bandaids cause blisters after leaving on for 24 hours   . Sulfa Antibiotics Other (See Comments)    "was terrible" "eye went crazy"  . Latex Other (See Comments)    blisters    Patient Measurements: Height: 6' (182.9 cm) Weight: 213 lb 13.5 oz (97 kg) IBW/kg (Calculated) : 77.6 HEPARIN DW (KG): 97  Vital Signs: Temp: 98.2 F (36.8 C) (02/10 0642) Temp Source: Oral (02/10 0642) BP: 132/67 (02/10 9562) Pulse Rate: 72 (02/10 0642)  Labs: Recent Labs    09/02/18 1231 09/02/18 2347 09/03/18 0544  09/03/18 2020 09/04/18 0445 09/05/18 0540  HGB 11.0*  --  11.1*  --   --  9.5* 9.7*  HCT 35.2*  --  35.3*  --   --  29.9* 31.1*  PLT 315  --  329  --   --  281 285  APTT  --  41*  --   --   --   --   --   LABPROT  --  13.4  --   --   --   --   --   INR  --  1.03  --   --   --   --   --   HEPARINUNFRC  --   --   --    < > 0.40 0.61 0.65  CREATININE 0.92  --  0.88  --   --  1.27*  --   TROPONINI  --  <0.03  --   --   --   --   --    < > = values in this interval not displayed.   Estimated Creatinine Clearance: 54.2 mL/min (A) (by C-G formula based on SCr of 1.27 mg/dL (H)).  Medical History: Past Medical History:  Diagnosis Date  . Anginal pain ([place])   . Arthritis    Bilateral knee  . Coronary artery disease   . Diabetes mellitus    insulin dependent   . Hyperlipidemia   . Hypertension   . Progressive angina ([place]) 03/2015   3.0x12 mm Resolute DES to pLAD, Angiosculpt scoring balloon to dLAD  . Stroke [facility])     Medications:   Assessment: 85 yo male presented to the ED for SOB which has been progressive over past several months and worsening significantly x several days. Chest x-ray shows bilateral pleural effusions with concern for underlying DVT/pulmonary emboli. D-dimer is elevated. Pharmacy has been consulted for IV  heparin dosing.  Heparin level remains therapeutic.  Goal of Therapy:  Heparin level 0.3-0.7 units/ml Monitor platelets by anticoagulation protocol: Yes   Plan:  Continue Heparin infusion at 2000 units/hr Heparin level daily while on heparin Monitor for S/S of bleeding F/U transition to po tx  Isac Sarna, BS Vena Austria, BCPS Clinical Pharmacist Pager [phone] 09/05/2018,7:58 AM

## 2018-09-05 NOTE — Care Management (Signed)
eliquis 5 mg bid 30 day supply is covered under a $30 copay (retail pharmacy) with no authorization needed per Lattie Haw with [facility] [phone]

## 2018-09-05 NOTE — Progress Notes (Signed)
Discharge instructions given on medications and follow up visits,patient verbalized understanding. Prescriptions sent to Pharmacy of choice documented on AVS,,Accompanied by staff to an awaiting vehicle.

## 2018-09-06 ENCOUNTER — Telehealth: Payer: Self-pay | Admitting: Family Medicine

## 2018-09-06 NOTE — Telephone Encounter (Signed)
1.  Look in epic #2 referral was made on the seventh #3 please make referral urgent #4 patient is supposed to do a hospital follow-up office visit either later this week or the started next week please go ahead and schedule within the next 7 days-if the patient states he is having a lot of shortness of breath breathing issues I want to see him this week

## 2018-09-06 NOTE — Telephone Encounter (Signed)
Patient was told to call back to get a referral to a lung doctor from his last visit.

## 2018-09-06 NOTE — Telephone Encounter (Signed)
Contacted patient and informed patient that referral was placed and that referral specialist has been out of office. Referral was placed in as urgent. Pt states he is doing "100,00 times better". Pt has appt for Friday 09/09/2018 at 11 am.

## 2018-09-06 NOTE — Telephone Encounter (Signed)
Please advise. Thank you

## 2018-09-07 ENCOUNTER — Encounter: Payer: Self-pay | Admitting: Family Medicine

## 2018-09-08 ENCOUNTER — Emergency Department (HOSPITAL_COMMUNITY): Payer: Medicare Other

## 2018-09-08 ENCOUNTER — Other Ambulatory Visit: Payer: Self-pay

## 2018-09-08 ENCOUNTER — Telehealth: Payer: Self-pay | Admitting: Family Medicine

## 2018-09-08 ENCOUNTER — Emergency Department (HOSPITAL_COMMUNITY)
Admission: EM | Admit: 2018-09-08 | Discharge: 2018-09-08 | Disposition: A | Discharge status: Home / Self Care | Payer: Medicare Other | Attending: Emergency Medicine | Admitting: Emergency Medicine

## 2018-09-08 ENCOUNTER — Encounter (HOSPITAL_COMMUNITY): Payer: Self-pay | Admitting: Emergency Medicine

## 2018-09-08 DIAGNOSIS — I2510 Atherosclerotic heart disease of native coronary artery without angina pectoris: Secondary | ICD-10-CM | POA: Insufficient documentation

## 2018-09-08 DIAGNOSIS — Z794 Long term (current) use of insulin: Secondary | ICD-10-CM | POA: Insufficient documentation

## 2018-09-08 DIAGNOSIS — I110 Hypertensive heart disease with heart failure: Secondary | ICD-10-CM | POA: Insufficient documentation

## 2018-09-08 DIAGNOSIS — S20461A Insect bite (nonvenomous) of right back wall of thorax, initial encounter: Secondary | ICD-10-CM | POA: Insufficient documentation

## 2018-09-08 DIAGNOSIS — J90 Pleural effusion, not elsewhere classified: Secondary | ICD-10-CM | POA: Insufficient documentation

## 2018-09-08 DIAGNOSIS — E119 Type 2 diabetes mellitus without complications: Secondary | ICD-10-CM | POA: Insufficient documentation

## 2018-09-08 DIAGNOSIS — Y939 Activity, unspecified: Secondary | ICD-10-CM | POA: Insufficient documentation

## 2018-09-08 DIAGNOSIS — Y999 Unspecified external cause status: Secondary | ICD-10-CM | POA: Diagnosis not present

## 2018-09-08 DIAGNOSIS — I5031 Acute diastolic (congestive) heart failure: Secondary | ICD-10-CM | POA: Insufficient documentation

## 2018-09-08 DIAGNOSIS — Z8673 Personal history of transient ischemic attack (TIA), and cerebral infarction without residual deficits: Secondary | ICD-10-CM | POA: Insufficient documentation

## 2018-09-08 DIAGNOSIS — W57XXXA Bitten or stung by nonvenomous insect and other nonvenomous arthropods, initial encounter: Secondary | ICD-10-CM | POA: Diagnosis not present

## 2018-09-08 DIAGNOSIS — S30860A Insect bite (nonvenomous) of lower back and pelvis, initial encounter: Secondary | ICD-10-CM | POA: Diagnosis not present

## 2018-09-08 DIAGNOSIS — Y929 Unspecified place or not applicable: Secondary | ICD-10-CM | POA: Diagnosis not present

## 2018-09-08 DIAGNOSIS — Z79899 Other long term (current) drug therapy: Secondary | ICD-10-CM | POA: Insufficient documentation

## 2018-09-08 DIAGNOSIS — Z7901 Long term (current) use of anticoagulants: Secondary | ICD-10-CM | POA: Insufficient documentation

## 2018-09-08 DIAGNOSIS — R0602 Shortness of breath: Secondary | ICD-10-CM | POA: Diagnosis not present

## 2018-09-08 LAB — BASIC METABOLIC PANEL
ANION GAP: 8 (ref 5–15)
BUN: 24 mg/dL — ABNORMAL HIGH (ref 8–23)
CO2: 26 mmol/L (ref 22–32)
Calcium: 9.4 mg/dL (ref 8.9–10.3)
Chloride: 96 mmol/L — ABNORMAL LOW (ref 98–111)
Creatinine, Ser: 0.98 mg/dL (ref 0.61–1.24)
GFR calc Af Amer: >60 mL/min (ref >60)
GFR calc non Af Amer: >60 mL/min (ref >60)
Glucose, Bld: 126 mg/dL — ABNORMAL HIGH (ref 70–99)
POTASSIUM: 5.3 mmol/L — AB (ref 3.5–5.1)
Sodium: 130 mmol/L — ABNORMAL LOW (ref 135–145)

## 2018-09-08 LAB — CBC WITH DIFFERENTIAL/PLATELET
Abs Immature Granulocytes: 0.03 10*3/uL (ref 0.00–0.07)
Basophils Absolute: 0.1 10*3/uL (ref 0.0–0.1)
Basophils Relative: 1 %
Eosinophils Absolute: 0.5 10*3/uL (ref 0.0–0.5)
Eosinophils Relative: 7 %
HCT: 33.2 % — ABNORMAL LOW (ref 39.0–52.0)
Hemoglobin: 10.3 g/dL — ABNORMAL LOW (ref 13.0–17.0)
Immature Granulocytes: 0 %
Lymphocytes Relative: 12 %
Lymphs Abs: 0.8 10*3/uL (ref 0.7–4.0)
MCH: 27.3 pg (ref 26.0–34.0)
MCHC: 31 g/dL (ref 30.0–36.0)
MCV: 88.1 fL (ref 80.0–100.0)
Monocytes Absolute: 0.8 10*3/uL (ref 0.1–1.0)
Monocytes Relative: 12 %
NEUTROS ABS: 4.7 10*3/uL (ref 1.7–7.7)
NEUTROS PCT: 68 %
NRBC: 0 % (ref 0.0–0.2)
Platelets: 256 10*3/uL (ref 150–400)
RBC: 3.77 MIL/uL — AB (ref 4.22–5.81)
RDW: 13.5 % (ref 11.5–15.5)
WBC: 6.9 10*3/uL (ref 4.0–10.5)

## 2018-09-08 LAB — BRAIN NATRIURETIC PEPTIDE: B Natriuretic Peptide: 81 pg/mL (ref 0.0–100.0)

## 2018-09-08 LAB — TROPONIN I: Troponin I: <0.03 ng/mL (ref <0.03)

## 2018-09-08 MED ORDER — DOXYCYCLINE HYCLATE 50 MG PO CAPS: 200.0000 mg | ORAL_CAPSULE | Freq: Once | ORAL | 0 refills | Status: AC

## 2018-09-08 NOTE — ED Notes (Signed)
Tick found while undressing pt on back R shoulder.   Redness around area.   Tick placed in spiecemen cup, placed on counter in room.

## 2018-09-08 NOTE — ED Provider Notes (Signed)
[facility] EMERGENCY DEPARTMENT Provider Note   CSN: [id number] Arrival date & time: 09/08/18  0901     History   Chief Complaint Chief Complaint  Patient presents with  . Shortness of Breath    HPI Jerry Burns is a 85 y.o. male with PMH significant for recent admit with R DVT and presumed PE, HFpEF, DM, HTN, CAD s/p LAD stent, HLD.  Patient states that he left the hospital on Monday and was feeling relatively well for 1 day but then on Tuesday started having more shortness of breath. He states that he has needed to sleep either propped up or in a chair. He called his PCP today who told him to come to ED for evaluation. Has had some leg swelling that is only slightly better than during his hospital stay. Has had a nonproductive cough. Reports compliance on eliquis. No fevers, sputum production, chest pain/pressure, palpitations.  Of note a tick was found on his R upper back and patient states that he has not been outside in over 1 week.       Past Medical History:  Diagnosis Date  . Anginal pain ([place])   . Arthritis    Bilateral knee  . Coronary artery disease   . Diabetes mellitus    insulin dependent   . Hyperlipidemia   . Hypertension   . Progressive angina ([place]) 03/2015   3.0x12 mm Resolute DES to pLAD, Angiosculpt scoring balloon to dLAD  . Stroke [facility])     Patient Active Problem List   Diagnosis Date Noted  . Acute diastolic CHF (congestive heart failure) ([place]) 09/03/2018  . Elevated d-dimer   . Leg swelling   . Peripheral edema   . Normochromic normocytic anemia 04/11/2018  . Heme positive stool 04/11/2018  . Long term current use of antithrombotics/antiplatelets 02/06/2016  . Exertional angina ([place]) 04/12/2015  . History of colonic polyps 11/21/2014  . ASCVD (arteriosclerotic cardiovascular disease) 09/03/2014  . Unstable angina ([place]) 08/22/2014  . Type 2 diabetes mellitus with diabetic neuropathy, with long-term current use of insulin ([place])  08/08/2014  . Chest pain on exertion 07/23/2014  . Rotator cuff syndrome of right shoulder 12/16/2011  . Shoulder pain, right 12/16/2011  . HYPERKALEMIA 01/31/2009  . KNEE PAIN, LEFT 07/31/2008  . DIABETIC FOOT ULCER, TOE 12/23/2007  . BPH (benign prostatic hyperplasia) 01/26/2007  . URINARY TRACT INFECTION 01/12/2007  . URINARY RETENTION 01/12/2007  . ERECTILE DYSFUNCTION 11/01/2006  . Hyperlipidemia 08/04/2006  . Essential hypertension 08/04/2006  . OSTEOARTHRITIS 08/04/2006    Past Surgical History:  Procedure Laterality Date  . APPENDECTOMY    . CARDIAC CATHETERIZATION N/A 04/12/2015   Procedure: Left Heart Cath and Coronary Angiography;  Surgeon: Sherren Mocha, MD; pLAD 75>0% w/ 3.0x12 mm Resolute DES, dLAD 80>0% w/ Angiosculpt scoring balloon, CFX luminal irreg, RCA mild dz, EF 55-65%   . COLONOSCOPY  2011   RMR: 1. Normal rectum 2. Left sided diverticula , 2 ascending colon polyps, status post snare polypectomy. Remainder of colonic mucosa appeared unremarkable.   . COLONOSCOPY N/A 05/13/2016   Procedure: COLONOSCOPY;  Surgeon: Daneil Dolin, MD;  Location[facility];  Service: Endoscopy;  Laterality: N/A;  7:30 AM  . COLONOSCOPY N/A 05/18/2018   Procedure: COLONOSCOPY;  Surgeon: Daneil Dolin, MD;  Location: [facility];  Service: Endoscopy;  Laterality: N/A;  1:15pm  . CORONARY ANGIOGRAPHY N/A 02/04/2017   Procedure: CORONARY ANGIOGRAPHY (CATH LAB);  Surgeon: Troy Sine, MD;  Location: [facility]  INVASIVE CV LAB;  Service: Cardiovascular;  Laterality: N/A;  . CORONARY ANGIOPLASTY WITH STENT PLACEMENT  08/22/2014   PTCA/DES X mLAD with 2.5 x 16 mm Promus Premier DES by Dr Julianne Handler  . ESOPHAGOGASTRODUODENOSCOPY N/A 05/18/2018   Procedure: ESOPHAGOGASTRODUODENOSCOPY (EGD);  Surgeon: Daneil Dolin, MD;  Location: [facility];  Service: Endoscopy;  Laterality: N/A;  . FOOT SURGERY Right   . LEFT HEART CATHETERIZATION WITH CORONARY ANGIOGRAM N/A 08/22/2014   Procedure:  LEFT HEART CATHETERIZATION WITH CORONARY ANGIOGRAM;  Surgeon: Burnell Blanks, MD;  Location: [facility] CATH LAB;  Service: Cardiovascular;  Laterality: N/A;  . PERCUTANEOUS CORONARY STENT INTERVENTION (PCI-S)  08/22/2014   Procedure: PERCUTANEOUS CORONARY STENT INTERVENTION (PCI-S);  Surgeon: Burnell Blanks, MD;   . SHOULDER SURGERY    . TONSILLECTOMY          Home Medications    Prior to Admission medications   Medication Sig Start Date End Date Taking? Authorizing Provider  amLODipine (NORVASC) 10 MG tablet TAKE (1) TABLET BY MOUTH ONCE DAILY. Patient taking differently: Take 10 mg by mouth daily.  04/22/18   Kathyrn Drown, MD  aspirin EC 81 MG tablet Take 81 mg by mouth daily.    [provider]  atorvastatin (LIPITOR) 80 MG tablet Take 1 tablet (80 mg total) by mouth daily. 07/11/18   Kathyrn Drown, MD  azithromycin (ZITHROMAX) 250 MG tablet Take 1 tablet (250 mg total) by mouth daily for 4 days. 09/06/18 09/10/18  Johnson, Clanford L, MD  carvedilol (COREG) 12.5 MG tablet TAKE (1) TABLET BY MOUTH TWICE DAILY. 03/14/18   Herminio Commons, MD  COMBIGAN 0.2-0.5 % ophthalmic solution Place 1 drop into the left eye 2 (two) times daily.  09/06/15   [provider]  dextromethorphan (DELSYM) 30 MG/5ML liquid Take 5 mLs (30 mg total) by mouth 2 (two) times daily as needed for cough. 09/05/18   Johnson, Clanford L, MD  dutasteride (AVODART) 0.5 MG capsule TAKE ONE CAPSULE BY MOUTH ONCE DAILY. 08/22/18   Kathyrn Drown, MD  ELIQUIS DVT/PE STARTER PACK (ELIQUIS STARTER PACK) 5 MG TABS Take as directed on package: start with two-5mg  tablets twice daily for 7 days. On day 8, switch to one-5mg  tablet twice daily. 09/05/18   Johnson, Clanford L, MD  Emollient (CERAVE) CREA Apply 1 application topically daily as needed (dry skin).    [provider]  erythromycin ophthalmic ointment Place 1 application into the left eye 2 (two) times daily.     [provider]  Flaxseed, Linseed, (FLAXSEED OIL) 1200 MG CAPS Take 1,200 mg by mouth daily.    [provider]  Insulin Glargine (LANTUS SOLOSTAR) 100 UNIT/ML Solostar Pen INJECT 22 UNITS SUBCUTANEOUSLY ONCE DAILY AT  10  PM Patient taking differently: Inject 17-25 Units into the skin at bedtime.  01/11/18   Kathyrn Drown, MD  metFORMIN (GLUCOPHAGE) 1000 MG tablet TAKE (1) TABLET BY MOUTH TWICE A DAY WITH A MEAL. 05/30/18   Kathyrn Drown, MD  Multiple Vitamin (MULTIVITAMIN) tablet Take 1 tablet by mouth daily.    [provider]  nitroGLYCERIN (NITROSTAT) 0.4 MG SL tablet PLACE 1 TAB UNDER TONGUE EVERY 5 MIN IF NEEDED FOR CHEST PAIN. MAY USE 3 TIMES.NO RELIEF CALL 911. Patient taking differently: Place 0.4 mg under the tongue every 5 (five) minutes as needed for chest pain.  04/22/18   Herminio Commons, MD  pantoprazole (PROTONIX) 40 MG tablet Take 40 mg by  mouth. 45 mins   before supper 08/15/18   [provider]  Polyvinyl Alcohol-Povidone (REFRESH OP) Place 1 drop into the left eye 2 (two) times daily.    [provider]  psyllium (METAMUCIL) 58.6 % powder Take 1 packet by mouth daily.     [provider]  quinapril (ACCUPRIL) 10 MG tablet Take 1 tablet (10 mg total) by mouth daily. 09/09/18   Johnson, Clanford L, MD  sitaGLIPtin (JANUVIA) 100 MG tablet Take 1 tablet (100 mg total) by mouth daily. 07/21/18   Kathyrn Drown, MD  tamsulosin (FLOMAX) 0.4 MG CAPS capsule Take 1 capsule (0.4 mg total) by mouth daily. 07/21/18   Kathyrn Drown, MD  Turmeric 500 MG CAPS Take 500 mg by mouth daily.    [provider]    Family History Family History  Problem Relation Age of Onset  . Diabetes Other   . Diabetes Brother   . Colon cancer Maternal Uncle   . Skin cancer Mother   . Diabetes Father   . Hypertension Sister     Social History Social History   Tobacco Use  . Smoking status: Former Smoker    Packs/day: 1.00    Years: 20.00    Pack  years: 20.00    Types: Cigarettes, Cigars    Start date: 08/10/1955    Last attempt to quit: 07/28/2007    Years since quitting: 11.1  . Smokeless tobacco: Never Used  . Tobacco comment: Quit x 15 years  Substance Use Topics  . Alcohol use: Yes    Alcohol/week: 0.0 standard drinks    Comment: Daily about 1 glass of wine  . Drug use: No     Allergies   Adhesive [tape]; Sulfa antibiotics; and Latex   Review of Systems Review of Systems  Constitutional: Negative for chills and fever.  HENT: Negative for congestion, rhinorrhea and sore throat.   Respiratory: Positive for cough and shortness of breath. Negative for wheezing.        + Orthopnea   Cardiovascular: Positive for leg swelling. Negative for chest pain and palpitations.  Gastrointestinal: Negative for abdominal pain, diarrhea, nausea and vomiting.  Genitourinary: Positive for decreased urine volume. Negative for dysuria, frequency and urgency.  Skin: Positive for rash.       R upper back tick bite  Neurological: Negative for dizziness and light-headedness.     Physical Exam Updated Vital Signs BP 127/65 (BP Location: Right Arm)   Pulse 71   Temp (!) 97.5 F (36.4 C) (Oral)   Resp 20   Ht 6' (1.829 m)   Wt 95.9 kg   SpO2 98%   BMI 28.67 kg/m   Physical Exam Constitutional:      General: He is not in acute distress.    Appearance: He is well-developed. He is obese. He is not ill-appearing, toxic-appearing or diaphoretic.  HENT:     Head: Normocephalic and atraumatic.     Mouth/Throat:     Mouth: Mucous membranes are moist.     Pharynx: Oropharynx is clear. No pharyngeal swelling or oropharyngeal exudate.  Neck:     Musculoskeletal: Normal range of motion.     Vascular: JVD present.  Cardiovascular:     Rate and Rhythm: Normal rate and regular rhythm.     Heart sounds: No murmur.  Pulmonary:     Effort: Pulmonary effort is normal.     Breath sounds: Examination of the left-lower field reveals wheezing.  Wheezing present.  Musculoskeletal:     Right lower leg: Edema present.     Left lower leg: Edema present.     Comments: 2+ pitting edema to knees bilaterally  Skin:    General: Skin is warm and dry.     Findings: Rash (1cm erythematous area surrounding area of tick removal) present.  Neurological:     General: No focal deficit present.     Mental Status: He is alert and oriented to person, place, and time.  Psychiatric:        Mood and Affect: Mood normal.      ED Treatments / Results  Labs (all labs ordered are listed, but only abnormal results are displayed) Labs Reviewed  BASIC METABOLIC PANEL - Abnormal; Notable for the following components:      Result Value   Sodium 130 (*)    Potassium 5.3 (*)    Chloride 96 (*)    Glucose, Bld 126 (*)    BUN 24 (*)    All other components within normal limits  CBC WITH DIFFERENTIAL/PLATELET - Abnormal; Notable for the following components:   RBC 3.77 (*)    Hemoglobin 10.3 (*)    HCT 33.2 (*)    All other components within normal limits  BRAIN NATRIURETIC PEPTIDE  TROPONIN I    EKG EKG Interpretation  Date/Time:  Thursday September 08 2018 09:43:08 EST Ventricular Rate:  63 PR Interval:    QRS Duration: 120 QT Interval:  425 QTC Calculation: 435 R Axis:   -50 Text Interpretation:  Sinus rhythm Multiform ventricular premature complexes Borderline prolonged PR interval Incomplete left bundle branch block poor baseline Confirmed by Elnora Morrison [phone]) on 09/08/2018 10:12:29 AM   Radiology Dg Chest 2 View  Result Date: 09/08/2018 CLINICAL DATA:  Patient with cardiac stents.  Shortness of breath EXAM: CHEST - 2 VIEW COMPARISON:  Chest radiograph 09/04/2018 FINDINGS: Monitoring leads overlie the patient. Stable cardiomegaly. Persistent moderate left and small right pleural effusions with underlying opacities. No pneumothorax. Thoracic spine degenerative changes. IMPRESSION: Persistent moderate left and small right effusions  with underlying opacities. Electronically Signed   By: Lovey Newcomer M.D.   On: 09/08/2018 10:58    Procedures Procedures (including critical care time)  Medications Ordered in ED Medications - No data to display   Initial Impression / Assessment and Plan / ED Course  I have reviewed the triage vital signs and the nursing notes.  Pertinent labs & imaging results that were available during my care of the patient were reviewed by me and considered in my medical decision making (see chart for details).    Patient with orthopnea and +JVD but NWOB on RA and found to have persistent pleural effusions, moderate on L and small on R, that are unchanged from 09/04/18. PCP placed referral to pulmonology for further evaluation which is being processed. EKG and trop neg. His potassium was noted to be elevated and he will need this rechecked as outpatient.   He is afebrile with stable vitals. Recommended follow up outpatient with PCP and pulm. Discussed return precautions including significantly worsening SOB or increased WOB.  Of note, was found to have a tick attached to R upper back that likely has been present for the last 1 week with surrounding erythema so treat with doxycycline 2000mg  x1 for prophylaxis.   Final Clinical Impressions(s) / ED Diagnoses   Final diagnoses:  Bilateral pleural effusion  Tick bite of back, initial encounter    ED Discharge Orders  Ordered    doxycycline (VIBRAMYCIN) 50 MG capsule   Once     09/08/18 1211           Bufford Lope, DO 09/08/18 1228    Elnora Morrison, MD 09/08/18 1610

## 2018-09-08 NOTE — ED Triage Notes (Signed)
Pt reports SOB x 3 weeks increasing over the past cpl of days, hx of CHF and recent admit for same

## 2018-09-08 NOTE — Discharge Instructions (Addendum)
Please take antibiotic doxycycline 200mg  all at once for one dose as a preventative measure for the tick bite.   Please follow up with Dr. Wolfgang [place] for the shortness of breath, the pulmonology referral is underway and was sent to their office yesterday afternoon.

## 2018-09-08 NOTE — Telephone Encounter (Signed)
Pt contacted office due to being short of breath. Pt states we were setting up referral to lung doctor. Pt states that he has to sit down a lot when walking due to being short of breath. Pt sounded very short of breath on phone. Pt advised to go to ER. Pt verbalized understanding.

## 2018-09-09 ENCOUNTER — Other Ambulatory Visit: Payer: Self-pay

## 2018-09-09 ENCOUNTER — Ambulatory Visit (INDEPENDENT_AMBULATORY_CARE_PROVIDER_SITE_OTHER): Payer: Medicare Other | Admitting: Family Medicine

## 2018-09-09 ENCOUNTER — Encounter (HOSPITAL_COMMUNITY): Payer: Self-pay | Admitting: Hematology

## 2018-09-09 ENCOUNTER — Inpatient Hospital Stay (HOSPITAL_COMMUNITY): Payer: Medicare Other | Attending: Hematology | Admitting: Hematology

## 2018-09-09 ENCOUNTER — Encounter: Payer: Self-pay | Admitting: Family Medicine

## 2018-09-09 ENCOUNTER — Inpatient Hospital Stay (HOSPITAL_COMMUNITY): Payer: Medicare Other

## 2018-09-09 VITALS — BP 132/78 | HR 68 | Ht 72.0 in | Wt 213.0 lb

## 2018-09-09 DIAGNOSIS — Z87891 Personal history of nicotine dependence: Secondary | ICD-10-CM

## 2018-09-09 DIAGNOSIS — R7989 Other specified abnormal findings of blood chemistry: Secondary | ICD-10-CM | POA: Diagnosis not present

## 2018-09-09 DIAGNOSIS — I10 Essential (primary) hypertension: Secondary | ICD-10-CM | POA: Diagnosis not present

## 2018-09-09 DIAGNOSIS — R0609 Other forms of dyspnea: Secondary | ICD-10-CM | POA: Diagnosis not present

## 2018-09-09 DIAGNOSIS — D649 Anemia, unspecified: Secondary | ICD-10-CM | POA: Diagnosis not present

## 2018-09-09 DIAGNOSIS — J90 Pleural effusion, not elsewhere classified: Secondary | ICD-10-CM | POA: Diagnosis not present

## 2018-09-09 DIAGNOSIS — I824Z1 Acute embolism and thrombosis of unspecified deep veins of right distal lower extremity: Secondary | ICD-10-CM

## 2018-09-09 DIAGNOSIS — E119 Type 2 diabetes mellitus without complications: Secondary | ICD-10-CM | POA: Diagnosis not present

## 2018-09-09 DIAGNOSIS — I824Z9 Acute embolism and thrombosis of unspecified deep veins of unspecified distal lower extremity: Secondary | ICD-10-CM

## 2018-09-09 DIAGNOSIS — Z7901 Long term (current) use of anticoagulants: Secondary | ICD-10-CM

## 2018-09-09 DIAGNOSIS — R609 Edema, unspecified: Secondary | ICD-10-CM | POA: Diagnosis not present

## 2018-09-09 HISTORY — DX: Acute embolism and thrombosis of unspecified deep veins of unspecified distal lower extremity: I82.4Z9

## 2018-09-09 LAB — COMPREHENSIVE METABOLIC PANEL
ALT: 19 U/L (ref 0–44)
AST: 21 U/L (ref 15–41)
Albumin: 3.8 g/dL (ref 3.5–5.0)
Alkaline Phosphatase: 91 U/L (ref 38–126)
Anion gap: 9 (ref 5–15)
BUN: 25 mg/dL — ABNORMAL HIGH (ref 8–23)
CO2: 25 mmol/L (ref 22–32)
Calcium: 9.3 mg/dL (ref 8.9–10.3)
Chloride: 96 mmol/L — ABNORMAL LOW (ref 98–111)
Creatinine, Ser: 1.06 mg/dL (ref 0.61–1.24)
GFR calc non Af Amer: >60 mL/min (ref >60)
Glucose, Bld: 153 mg/dL — ABNORMAL HIGH (ref 70–99)
Potassium: 4.8 mmol/L (ref 3.5–5.1)
Sodium: 130 mmol/L — ABNORMAL LOW (ref 135–145)
Total Bilirubin: 0.6 mg/dL (ref 0.3–1.2)
Total Protein: 7.5 g/dL (ref 6.5–8.1)

## 2018-09-09 LAB — CBC WITH DIFFERENTIAL/PLATELET
Abs Immature Granulocytes: 0.02 10*3/uL (ref 0.00–0.07)
Basophils Absolute: 0.1 10*3/uL (ref 0.0–0.1)
Basophils Relative: 1 %
Eosinophils Absolute: 0.5 10*3/uL (ref 0.0–0.5)
Eosinophils Relative: 7 %
HCT: 32.8 % — ABNORMAL LOW (ref 39.0–52.0)
Hemoglobin: 10.3 g/dL — ABNORMAL LOW (ref 13.0–17.0)
Immature Granulocytes: 0 %
Lymphocytes Relative: 16 %
Lymphs Abs: 1 10*3/uL (ref 0.7–4.0)
MCH: 26.8 pg (ref 26.0–34.0)
MCHC: 31.4 g/dL (ref 30.0–36.0)
MCV: 85.2 fL (ref 80.0–100.0)
Monocytes Absolute: 0.8 10*3/uL (ref 0.1–1.0)
Monocytes Relative: 11 %
Neutro Abs: 4.3 10*3/uL (ref 1.7–7.7)
Neutrophils Relative %: 65 %
Platelets: 261 10*3/uL (ref 150–400)
RBC: 3.85 MIL/uL — AB (ref 4.22–5.81)
RDW: 13.6 % (ref 11.5–15.5)
WBC: 6.7 10*3/uL (ref 4.0–10.5)
nRBC: 0 % (ref 0.0–0.2)

## 2018-09-09 LAB — FOLATE: Folate: 14.2 ng/mL (ref >5.9)

## 2018-09-09 LAB — IRON AND TIBC
Iron: 28 ug/dL — ABNORMAL LOW (ref 45–182)
Saturation Ratios: 9 % — ABNORMAL LOW (ref 17.9–39.5)
TIBC: 305 ug/dL (ref 250–450)
UIBC: 277 ug/dL

## 2018-09-09 LAB — DIRECT ANTIGLOBULIN TEST (NOT AT ARMC)
DAT, IgG: NEGATIVE
DAT, complement: NEGATIVE

## 2018-09-09 LAB — RETICULOCYTES
Immature Retic Fract: 5.6 % (ref 2.3–15.9)
RBC.: 3.85 MIL/uL — ABNORMAL LOW (ref 4.22–5.81)
Retic Count, Absolute: 40 10*3/uL (ref 19.0–186.0)
Retic Ct Pct: 1 % (ref 0.4–3.1)

## 2018-09-09 LAB — LACTATE DEHYDROGENASE: LDH: 135 U/L (ref 98–192)

## 2018-09-09 LAB — FERRITIN: Ferritin: 120 ng/mL (ref 24–336)

## 2018-09-09 LAB — VITAMIN B12: Vitamin B-12: 587 pg/mL (ref 180–914)

## 2018-09-09 MED ORDER — TORSEMIDE 20 MG PO TABS: 20.0000 mg | ORAL_TABLET | ORAL | 2 refills | Status: DC

## 2018-09-09 NOTE — Patient Instructions (Signed)
[facility] Discharge Instructions     Thank you for choosing [facility] at [facility] to provide your oncology and hematology care.  To afford each patient quality time with our provider, please arrive at least 15 minutes before your scheduled appointment time.   If you have a lab appointment with the Cancer Center please come in thru the  Main Entrance and check in at the main information desk  You need to re-schedule your appointment should you arrive 10 or more minutes late.  We strive to give you quality time with our providers, and arriving late affects you and other patients whose appointments are after yours.  Also, if you no show three or more times for appointments you may be dismissed from the clinic at the providers discretion.     Again, thank you for choosing [facility].  Our hope is that these requests will decrease the amount of time that you wait before being seen by our physicians.       _____________________________________________________________  Should you have questions after your visit to [facility], please contact our office at [phone] between the hours of 8:00 a.m. and 4:30 p.m.  Voicemails left after 4:00 p.m. will not be returned until the following business day.  For prescription refill requests, have your pharmacy contact our office and allow 72 hours.    Cancer Center Support Programs:   > Cancer Support Group  2nd Tuesday of the month 1pm-2pm, Journey Room    

## 2018-09-09 NOTE — Progress Notes (Signed)
Subjective:    Patient ID: Jerry Burns, male    DOB: 01/06/1934, 85 y.o.   MRN: [id number]  HPI  Patient arrives for a follow up from a recent ER visit for pleural effusion. His daughter was present with him today The patient has had significant shortness of breath over the past few weeks Echo normal for ejection fraction BNP was normal D-dimer significantly elevated In the hospital ultrasound right lower leg showed blood clots Patient unable to tolerate CT scan therefore uncertain if there is pulmonary embolism but that is a working diagnosis along with pleural effusion Patient also with mild renal insufficiency and anemia of chronic illness Patient will be seeing hematology coming up for their opinion Patient will also be seen pulmonary this coming Tuesday 30 minutes spent with patient today reviewing over all the different test results discussing with the patient what is going on plus also doing evaluation additional 10 to 15 minutes was spent speaking with CT scan and pulmonary  Review of Systems  Constitutional: Negative for diaphoresis and fatigue.  HENT: Negative for congestion and rhinorrhea.   Respiratory: Negative for cough and shortness of breath.   Cardiovascular: Negative for chest pain and leg swelling.  Gastrointestinal: Negative for abdominal pain and diarrhea.  Skin: Negative for color change and rash.  Neurological: Negative for dizziness and headaches.  Psychiatric/Behavioral: Negative for behavioral problems and confusion.       Objective:   Physical Exam Vitals signs reviewed.  Constitutional:      General: He is not in acute distress. HENT:     Head: Normocephalic and atraumatic.  Eyes:     General:        Right eye: No discharge.        Left eye: No discharge.  Neck:     Trachea: No tracheal deviation.  Cardiovascular:     Rate and Rhythm: Normal rate and regular rhythm.     Heart sounds: Normal heart sounds. No murmur.  Pulmonary:   Effort: Pulmonary effort is normal. No respiratory distress.     Breath sounds: Normal breath sounds.  Musculoskeletal:        General: Swelling present.  Lymphadenopathy:     Cervical: No cervical adenopathy.  Skin:    General: Skin is warm and dry.  Neurological:     Mental Status: He is alert.     Coordination: Coordination normal.  Psychiatric:        Behavior: Behavior normal.   Decreased breath sounds in the lower base left side Pedal edema is noted  CT tech was spoken with they stated that the patient would have to be able to lay flat for at least 10 minutes for a pulmonary embolus protocol.  Patient states he cannot tolerate that so therefore we will not do that test  I spoke with pulmonary PA regarding the patient they are requesting that we do more diuresis to try to assist the patient.  They will see the patient on Tuesday morning  O2 sat on room air 94% patient walked 2 laps within our office building became some short of breath but O2 sats stayed at 94% therefore we will not be doing any oxygenation at this point    Assessment & Plan:  HTN decent control Pedal edema- BNP normal on 2 different occasions within the past 10 days Echo looked good Pedal edema probably related to amlodipine Per request of pulmonary we will initiate torsemide 20 mg every morning and we will  recheck metabolic 7 next week with a follow-up visit in 1 week  Patient will be seeing pulmonary next week on Tuesday for evaluation of shortness of breath Presumptive diagnosis is pulmonary embolism Certainly could be multifactorial including some element of COPD as well as pleural effusion Pulmonary will help sort through this to guide what would be the best approach including possibility of pleural fluid removal Hold on CT scan see discussion above  Assistant anemia issues possibly anemia of chronic illness will be seeing hematology for further evaluation  Anticoagulation will continue this for now.   Patient does have DVTs in the right leg plus also presumptive pulmonary DVT-we will see what pulmonary recommends regarding how long to continue this medicine  Patient was instructed should he become severe over the next few days then it would be wise for him to report to [facility] ER for further evaluation

## 2018-09-09 NOTE — Progress Notes (Signed)
CONSULT NOTE  Patient Care Team: Kathyrn Drown, MD as PCP - General (Family Medicine) Herminio Commons, MD as PCP - Cardiology (Cardiology) Gala Romney Cristopher Estimable, MD as Consulting Physician (Gastroenterology)  CHIEF COMPLAINTS/PURPOSE OF CONSULTATION: Anemia   HISTORY OF PRESENTING ILLNESS:  Jerry Burns 85 y.o. male is here because of normocytic anemia. It is reported he has had low hemoglobin two different times. His last colonoscopy and EDG was on 05/18/18. It showed diverticulosis and polys. The EGD showed a hiatal hernia, ulcerative reflux esophagitis, and erosive gastropathy. He was sent to [place] for further investigation. His hemoglobin was 7.7 while in the hospital but he denies ever having a blood transfusion. He was recently admitted to the hospital for SOB due to CHF exacerbation. He is still SOB but it has improved. It was reported he has bilateral pleural effusions. He has a follow up appointment with Dr. Works on Tuesday for this. He also reports he was recently on two antibiotics one for a tick bite and one for his upper respiratory infection. He was started Eliquis Monday for a DVT in his right distal tibia. He denies any headaches or vision changes. Denies any nausea, vomiting, or diarrhea. Denies any new pains. Had not noticed any recent bleeding such as epistaxis, hematuria or hematochezia. Denies recent chest pain on exertion, shortness of breath on minimal exertion, pre-syncopal episodes, or palpitations. Denies any numbness or tingling in hands or feet. Denies any recent fevers, infections, or recent hospitalizations. Patient reports appetite at 50% and energy level at 25%. His appetite is decreased but he is maintaining his weight at this time.  He has smoked cigarettes from age 16 to 58, then he started smoking a pipe and stopped everything in 2009. He reports he drinks a glass of bourbon and a glass of wine every night for dinner.  He has a family history of his mother and  sister both had skin cancer. His daughter has breast cancer.  He had no prior history or diagnosis of cancer. His age appropriate screening programs are up-to-date. He reports he worked in [employer] most of his life and had routine asbestos exposure in the 1960s. Denies any harmful chemicals. He lives with his wife and is very functional and performs his own ADLs and activities. He is able to drive and handles his own finances.   MEDICAL HISTORY:  Past Medical History:  Diagnosis Date  . Anginal pain ([place])   . Arthritis    Bilateral knee  . Coronary artery disease   . Diabetes mellitus    insulin dependent   . DVT, lower extremity, distal, acute ([place]) 09/09/2018  . Hyperlipidemia   . Hypertension   . Progressive angina ([place]) 03/2015   3.0x12 mm Resolute DES to pLAD, Angiosculpt scoring balloon to dLAD  . Stroke [facility])     SURGICAL HISTORY: Past Surgical History:  Procedure Laterality Date  . APPENDECTOMY    . CARDIAC CATHETERIZATION N/A 04/12/2015   Procedure: Left Heart Cath and Coronary Angiography;  Surgeon: Sherren Mocha, MD; pLAD 75>0% w/ 3.0x12 mm Resolute DES, dLAD 80>0% w/ Angiosculpt scoring balloon, CFX luminal irreg, RCA mild dz, EF 55-65%   . COLONOSCOPY  2011   RMR: 1. Normal rectum 2. Left sided diverticula , 2 ascending colon polyps, status post snare polypectomy. Remainder of colonic mucosa appeared unremarkable.   . COLONOSCOPY N/A 05/13/2016   Procedure: COLONOSCOPY;  Surgeon: Daneil Dolin, MD;  [facility];  Service: Endoscopy;  Laterality: N/A;  7:30 AM  . COLONOSCOPY N/A 05/18/2018   Procedure: COLONOSCOPY;  Surgeon: Daneil Dolin, MD;  [facility];  Service: Endoscopy;  Laterality: N/A;  1:15pm  . CORONARY ANGIOGRAPHY N/A 02/04/2017   Procedure: CORONARY ANGIOGRAPHY (CATH LAB);  Surgeon: Troy Sine, MD;  Location: [place] CV LAB;  Service: Cardiovascular;  Laterality: N/A;  . CORONARY ANGIOPLASTY WITH STENT PLACEMENT   08/22/2014   PTCA/DES X mLAD with 2.5 x 16 mm Promus Premier DES by Dr Julianne Handler  . ESOPHAGOGASTRODUODENOSCOPY N/A 05/18/2018   Procedure: ESOPHAGOGASTRODUODENOSCOPY (EGD);  Surgeon: Daneil Dolin, MD;  [facility];  Service: Endoscopy;  Laterality: N/A;  . FOOT SURGERY Right   . LEFT HEART CATHETERIZATION WITH CORONARY ANGIOGRAM N/A 08/22/2014   Procedure: LEFT HEART CATHETERIZATION WITH CORONARY ANGIOGRAM;  Surgeon: Burnell Blanks, MD;  Location: [facility] CATH LAB;  Service: Cardiovascular;  Laterality: N/A;  . PERCUTANEOUS CORONARY STENT INTERVENTION (PCI-S)  08/22/2014   Procedure: PERCUTANEOUS CORONARY STENT INTERVENTION (PCI-S);  Surgeon: Burnell Blanks, MD;   . SHOULDER SURGERY    . TONSILLECTOMY      SOCIAL HISTORY: Social History   Socioeconomic History  . Marital status: Married    Spouse name: Not on file  . Number of children: Not on file  . Years of education: 35  . Highest education level: Not on file  Occupational History  . Occupation: retired  Scientific laboratory technician  . Financial resource strain: Not on file  . Food insecurity:    Worry: Not on file    Inability: Not on file  . Transportation needs:    Medical: Not on file    Non-medical: Not on file  Tobacco Use  . Smoking status: Former Smoker    Packs/day: 1.00    Years: 20.00    Pack years: 20.00    Types: Cigarettes, Cigars    Start date: 08/10/1955    Last attempt to quit: 07/28/2007    Years since quitting: 11.1  . Smokeless tobacco: Never Used  . Tobacco comment: Quit x 15 years  Substance and Sexual Activity  . Alcohol use: Yes    Alcohol/week: 0.0 standard drinks    Comment: Daily about 1 glass of wine  . Drug use: No  . Sexual activity: Not Currently  Lifestyle  . Physical activity:    Days per week: Not on file    Minutes per session: Not on file  . Stress: Not on file  Relationships  . Social connections:    Talks on phone: Not on file    Gets together: Not on file     Attends religious service: Not on file    Active member of club or organization: Not on file    Attends meetings of clubs or organizations: Not on file    Relationship status: Not on file  . Intimate partner violence:    Fear of current or ex partner: Not on file    Emotionally abused: Not on file    Physically abused: Not on file    Forced sexual activity: Not on file  Other Topics Concern  . Not on file  Social History Narrative  . Not on file    FAMILY HISTORY: Family History  Problem Relation Age of Onset  . Diabetes Other   . Diabetes Brother   . Colon cancer Maternal Uncle   . Skin cancer Mother   . Diabetes Father   . Hypertension Sister   .  Skin cancer Sister     ALLERGIES:  is allergic to adhesive [tape]; sulfa antibiotics; and latex.  MEDICATIONS:  Current Outpatient Medications  Medication Sig Dispense Refill  . amLODipine (NORVASC) 10 MG tablet TAKE (1) TABLET BY MOUTH ONCE DAILY. (Patient taking differently: Take 10 mg by mouth daily. ) 90 tablet 1  . atorvastatin (LIPITOR) 80 MG tablet Take 1 tablet (80 mg total) by mouth daily. 90 tablet 0  . azithromycin (ZITHROMAX) 250 MG tablet Take 1 tablet (250 mg total) by mouth daily for 4 days. 4 tablet 0  . carvedilol (COREG) 12.5 MG tablet TAKE (1) TABLET BY MOUTH TWICE DAILY. 180 tablet 4  . COMBIGAN 0.2-0.5 % ophthalmic solution Place 1 drop into the left eye 2 (two) times daily.   2  . dextromethorphan (DELSYM) 30 MG/5ML liquid Take 5 mLs (30 mg total) by mouth 2 (two) times daily as needed for cough. 89 mL 0  . dutasteride (AVODART) 0.5 MG capsule TAKE ONE CAPSULE BY MOUTH ONCE DAILY. 90 capsule 1  . ELIQUIS DVT/PE STARTER PACK (ELIQUIS STARTER PACK) 5 MG TABS Take as directed on package: start with two-5mg  tablets twice daily for 7 days. On day 8, switch to one-5mg  tablet twice daily. 1 each 0  . Emollient (CERAVE) CREA Apply 1 application topically daily as needed (dry skin).    . Flaxseed, Linseed, (FLAXSEED  OIL) 1200 MG CAPS Take 1,200 mg by mouth daily.    . Insulin Glargine (LANTUS SOLOSTAR) 100 UNIT/ML Solostar Pen INJECT 22 UNITS SUBCUTANEOUSLY ONCE DAILY AT  10  PM (Patient taking differently: Inject 17-25 Units into the skin at bedtime. ) 15 mL 5  . metFORMIN (GLUCOPHAGE) 1000 MG tablet TAKE (1) TABLET BY MOUTH TWICE A DAY WITH A MEAL. 180 tablet 0  . Multiple Vitamin (MULTIVITAMIN) tablet Take 1 tablet by mouth daily.    . pantoprazole (PROTONIX) 40 MG tablet Take 40 mg by mouth. 45 mins   before supper    . Polyvinyl Alcohol-Povidone (REFRESH OP) Place 1 drop into the left eye 2 (two) times daily.    . psyllium (METAMUCIL) 58.6 % powder Take 1 packet by mouth daily.     . quinapril (ACCUPRIL) 10 MG tablet Take 1 tablet (10 mg total) by mouth daily. 90 tablet 4  . sitaGLIPtin (JANUVIA) 100 MG tablet Take 1 tablet (100 mg total) by mouth daily. 30 tablet 0  . tamsulosin (FLOMAX) 0.4 MG CAPS capsule Take 1 capsule (0.4 mg total) by mouth daily. 90 capsule 0  . Turmeric 500 MG CAPS Take 500 mg by mouth daily.    Marland Kitchen aspirin EC 81 MG tablet Take 81 mg by mouth daily.    . nitroGLYCERIN (NITROSTAT) 0.4 MG SL tablet PLACE 1 TAB UNDER TONGUE EVERY 5 MIN IF NEEDED FOR CHEST PAIN. MAY USE 3 TIMES.NO RELIEF CALL 911. (Patient not taking: No sig reported) 25 tablet 3  . torsemide (DEMADEX) 20 MG tablet Take 1 tablet (20 mg total) by mouth every morning. 30 tablet 2   No current facility-administered medications for this visit.     REVIEW OF SYSTEMS:   Constitutional: Denies fevers, chills or abnormal night sweats Eyes: Denies blurriness of vision, double vision or watery eyes Ears, nose, mouth, throat, and face: Denies mucositis or sore throat Respiratory: Denies cough, dyspnea or wheezes Cardiovascular: Denies palpitation, chest discomfort or lower extremity swelling Gastrointestinal:  Denies nausea, heartburn or change in bowel habits Skin: Denies abnormal skin rashes Lymphatics: Denies new  lymphadenopathy or easy bruising Neurological:Denies numbness, tingling or new weaknesses Behavioral/Psych: Mood is stable, no new changes  All other systems were reviewed with the patient and are negative.  PHYSICAL EXAMINATION: ECOG PERFORMANCE STATUS: 1 - Symptomatic but completely ambulatory  Vitals:   09/09/18 1300  BP: (!) 151/67  Pulse: 68  Resp: 16  Temp: 97.9 F (36.6 C)  SpO2: 96%   Filed Weights   09/09/18 1300  Weight: 215 lb (97.5 kg)    GENERAL:alert, no distress and comfortable SKIN: skin color, texture, turgor are normal, no rashes or significant lesions EYES: normal, conjunctiva are pink and non-injected, sclera clear OROPHARYNX:no exudate, no erythema and lips, buccal mucosa, and tongue normal  NECK: supple, thyroid normal size, non-tender, without nodularity LYMPH:  no palpable lymphadenopathy in the cervical, axillary or inguinal LUNGS: clear to auscultation and percussion with normal breathing effort HEART: regular rate & rhythm and no murmurs and no lower extremity edema ABDOMEN:abdomen soft, non-tender and normal bowel sounds Musculoskeletal:no cyanosis of digits and no clubbing  PSYCH: alert & oriented x 3 with fluent speech NEURO: no focal motor/sensory deficits Extremities: 2+ edema bilaterally. LABORATORY DATA:  I have reviewed the data as listed Recent Results (from the past 2160 hour(s))  POCT hemoglobin     Status: Abnormal   Collection Time: 09/02/18 10:51 AM  Result Value Ref Range   Hemoglobin 7.7 (A) 11 - 14.6 g/dL  CBC with Differential/Platelet     Status: Abnormal   Collection Time: 09/02/18 12:31 PM  Result Value Ref Range   WBC 8.5 4.0 - 10.5 K/uL   RBC 4.12 (L) 4.22 - 5.81 MIL/uL   Hemoglobin 11.0 (L) 13.0 - 17.0 g/dL   HCT 35.2 (L) 39.0 - 52.0 %   MCV 85.4 80.0 - 100.0 fL   MCH 26.7 26.0 - 34.0 pg   MCHC 31.3 30.0 - 36.0 g/dL   RDW 13.6 11.5 - 15.5 %   Platelets 315 150 - 400 K/uL   nRBC 0.0 0.0 - 0.2 %   Neutrophils  Relative % 67 %   Neutro Abs 5.7 1.7 - 7.7 K/uL   Lymphocytes Relative 13 %   Lymphs Abs 1.1 0.7 - 4.0 K/uL   Monocytes Relative 9 %   Monocytes Absolute 0.8 0.1 - 1.0 K/uL   Eosinophils Relative 10 %   Eosinophils Absolute 0.8 (H) 0.0 - 0.5 K/uL   Basophils Relative 1 %   Basophils Absolute 0.1 0.0 - 0.1 K/uL   Immature Granulocytes 0 %   Abs Immature Granulocytes 0.02 0.00 - 0.07 K/uL    Comment: Performed at [facility], [address], [place], [place] [address]  Reticulocytes     Status: Abnormal   Collection Time: 09/02/18 12:31 PM  Result Value Ref Range   Retic Ct Pct 0.9 0.4 - 3.1 %   RBC. 4.12 (L) 4.22 - 5.81 MIL/uL   Retic Count, Absolute 38.3 19.0 - 186.0 K/uL   Immature Retic Fract 11.4 2.3 - 15.9 %    Comment: Performed at [facility], [address], [place], [place] [address]  Ferritin     Status: None   Collection Time: 09/02/18 12:31 PM  Result Value Ref Range   Ferritin 75 24 - 336 ng/mL    Comment: Performed at [facility], [address], [place], [place] [address]  Iron and TIBC     Status: Abnormal   Collection Time: 09/02/18 12:31 PM  Result Value Ref Range   Iron 28 (  L) 45 - 182 ug/dL   TIBC 303 250 - 450 ug/dL   Saturation Ratios 9 (L) 17.9 - 39.5 %   UIBC 275 ug/dL    Comment: Performed at [facility], [address], [place], [place] [address]  Hepatic function panel     Status: None   Collection Time: 09/02/18 12:31 PM  Result Value Ref Range   Total Protein 7.7 6.5 - 8.1 g/dL   Albumin 3.8 3.5 - 5.0 g/dL   AST 18 15 - 41 U/L   ALT 15 0 - 44 U/L   Alkaline Phosphatase 99 38 - 126 U/L   Total Bilirubin 0.5 0.3 - 1.2 mg/dL   Bilirubin, Direct 0.1 0.0 - 0.2 mg/dL   Indirect Bilirubin 0.4 0.3 - 0.9 mg/dL    Comment: Performed at [facility], [address] [place], [place] [address]  Basic metabolic panel     Status: Abnormal   Collection Time: 09/02/18 12:31 PM  Result Value Ref Range   Sodium 137 135 - 145 mmol/L   Potassium 4.8 3.5 - 5.1  mmol/L   Chloride 101 98 - 111 mmol/L   CO2 25 22 - 32 mmol/L   Glucose, Bld 137 (H) 70 - 99 mg/dL   BUN 24 (H) 8 - 23 mg/dL   Creatinine, Ser 0.92 0.61 - 1.24 mg/dL   Calcium 9.5 8.9 - 10.3 mg/dL   GFR calc non Af Amer >60 >60 mL/min   GFR calc Af Amer >60 >60 mL/min   Anion gap 11 5 - 15    Comment: Performed at [facility], [address], [place], [place] [address]  D-dimer, quantitative     Status: Abnormal   Collection Time: 09/02/18 11:47 PM  Result Value Ref Range   D-Dimer, Quant 4.68 (H) 0.00 - 0.50 ug/mL-FEU    Comment: (NOTE) At the manufacturer cut-off of 0.50 ug/mL FEU, this assay has been documented to exclude PE with a sensitivity and negative predictive value of 97 to 99%.  At this time, this assay has not been approved by the FDA to exclude DVT/VTE. Results should be correlated with clinical presentation. Performed at [facility], [address] [place], [place] [address]   Brain natriuretic peptide     Status: None   Collection Time: 09/02/18 11:47 PM  Result Value Ref Range   B Natriuretic Peptide 79.0 0.0 - 100.0 pg/mL    Comment: Performed at [facility], [address] [place], [place] [address]  Troponin I - Once     Status: None   Collection Time: 09/02/18 11:47 PM  Result Value Ref Range   Troponin I <0.03 <0.03 ng/mL    Comment: Performed at [facility], [address] [place], [place] [address]  Protime-INR     Status: None   Collection Time: 09/02/18 11:47 PM  Result Value Ref Range   Prothrombin Time 13.4 11.4 - 15.2 seconds   INR 1.03     Comment: Performed at [facility], [address] [place], [place] [address]  APTT     Status: Abnormal   Collection Time: 09/02/18 11:47 PM  Result Value Ref Range   aPTT 41 (H) 24 - 36 seconds    Comment:        IF BASELINE aPTT IS ELEVATED, SUGGEST PATIENT RISK ASSESSMENT BE USED TO DETERMINE APPROPRIATE ANTICOAGULANT THERAPY. Performed at [facility], [address], [place], [place] [address]    Urinalysis, Routine w reflex microscopic  Status: Abnormal   Collection Time: 09/02/18 11:50 PM  Result Value Ref Range   Color, Urine YELLOW YELLOW   APPearance CLEAR CLEAR   Specific Gravity, Urine 1.017 1.005 - 1.030   pH 5.0 5.0 - 8.0   Glucose, UA NEGATIVE NEGATIVE mg/dL   Hgb urine dipstick NEGATIVE NEGATIVE   Bilirubin Urine NEGATIVE NEGATIVE   Ketones, ur NEGATIVE NEGATIVE mg/dL   Protein, ur 100 (A) NEGATIVE mg/dL   Nitrite NEGATIVE NEGATIVE   Leukocytes, UA NEGATIVE NEGATIVE   RBC / HPF 0-5 0 - 5 RBC/hpf   WBC, UA 0-5 0 - 5 WBC/hpf   Bacteria, UA RARE (A) NONE SEEN   Squamous Epithelial / LPF 0-5 0 - 5   Mucus PRESENT     Comment: Performed at [facility], [address], [place], [place] [address]  CBC     Status: Abnormal   Collection Time: 09/03/18  5:44 AM  Result Value Ref Range   WBC 8.3 4.0 - 10.5 K/uL   RBC 4.22 4.22 - 5.81 MIL/uL   Hemoglobin 11.1 (L) 13.0 - 17.0 g/dL   HCT 35.3 (L) 39.0 - 52.0 %   MCV 83.6 80.0 - 100.0 fL   MCH 26.3 26.0 - 34.0 pg   MCHC 31.4 30.0 - 36.0 g/dL   RDW 13.6 11.5 - 15.5 %   Platelets 329 150 - 400 K/uL   nRBC 0.0 0.0 - 0.2 %    Comment: Performed at [facility], [address], [place], [place] [address]  Comprehensive metabolic panel     Status: Abnormal   Collection Time: 09/03/18  5:44 AM  Result Value Ref Range   Sodium 134 (L) 135 - 145 mmol/L   Potassium 3.8 3.5 - 5.1 mmol/L    Comment: DELTA CHECK NOTED   Chloride 97 (L) 98 - 111 mmol/L   CO2 26 22 - 32 mmol/L   Glucose, Bld 139 (H) 70 - 99 mg/dL   BUN 23 8 - 23 mg/dL   Creatinine, Ser 0.88 0.61 - 1.24 mg/dL   Calcium 9.4 8.9 - 10.3 mg/dL   Total Protein 7.7 6.5 - 8.1 g/dL   Albumin 3.9 3.5 - 5.0 g/dL   AST 18 15 - 41 U/L   ALT 14 0 - 44 U/L   Alkaline Phosphatase 102 38 - 126 U/L   Total Bilirubin 0.8 0.3 - 1.2 mg/dL   GFR calc non Af Amer >60 >60 mL/min   GFR calc Af Amer >60 >60 mL/min   Anion gap 11 5 - 15    Comment: Performed at [facility], [address] [place], [place] [address]  Hemoglobin A1c     Status: Abnormal   Collection Time: 09/03/18  5:44 AM  Result Value Ref Range   Hgb A1c MFr Bld 7.6 (H) 4.8 - 5.6 %    Comment: (NOTE) Pre diabetes:          5.7%-6.4% Diabetes:              >6.4% Glycemic control for   <7.0% adults with diabetes    Mean Plasma Glucose 171.42 mg/dL    Comment: Performed at [facility], [place] [address], [place], [place] [address]  CBG monitoring, ED     Status: Abnormal   Collection Time: 09/03/18  7:44 AM  Result Value Ref Range   Glucose-Capillary 127 (H) 70 - 99 mg/dL  Heparin level (unfractionated)     Status: Abnormal   Collection  Time: 09/03/18 10:53 AM  Result Value Ref Range   Heparin Unfractionated <0.10 (L) 0.30 - 0.70 IU/mL    Comment: (NOTE) If heparin results are below expected values, and patient dosage has  been confirmed, suggest follow up testing of antithrombin III levels. Performed at [facility], [address], [place], [place] [address]   Glucose, capillary     Status: Abnormal   Collection Time: 09/03/18 11:22 AM  Result Value Ref Range   Glucose-Capillary 164 (H) 70 - 99 mg/dL  ECHOCARDIOGRAM COMPLETE     Status: None   Collection Time: 09/03/18  3:17 PM  Result Value Ref Range   Weight 3,421.54 oz   Height 72 in   BP 129/70 mmHg  Glucose, capillary     Status: Abnormal   Collection Time: 09/03/18  6:03 PM  Result Value Ref Range   Glucose-Capillary 143 (H) 70 - 99 mg/dL  Heparin level (unfractionated)     Status: None   Collection Time: 09/03/18  8:20 PM  Result Value Ref Range   Heparin Unfractionated 0.40 0.30 - 0.70 IU/mL    Comment: (NOTE) If heparin results are below expected values, and patient dosage has  been confirmed, suggest follow up testing of antithrombin III levels. Performed at [facility], [address], [place], [place] [address]   Glucose, capillary     Status: Abnormal   Collection Time: 09/03/18  9:29 PM  Result Value  Ref Range   Glucose-Capillary 140 (H) 70 - 99 mg/dL   Comment 1 Notify RN    Comment 2 Document in Chart   Heparin level (unfractionated)     Status: None   Collection Time: 09/04/18  4:45 AM  Result Value Ref Range   Heparin Unfractionated 0.61 0.30 - 0.70 IU/mL    Comment: (NOTE) If heparin results are below expected values, and patient dosage has  been confirmed, suggest follow up testing of antithrombin III levels. Performed at [facility], [address] [place], [place] [address]   CBC     Status: Abnormal   Collection Time: 09/04/18  4:45 AM  Result Value Ref Range   WBC 7.9 4.0 - 10.5 K/uL   RBC 3.54 (L) 4.22 - 5.81 MIL/uL   Hemoglobin 9.5 (L) 13.0 - 17.0 g/dL   HCT 29.9 (L) 39.0 - 52.0 %   MCV 84.5 80.0 - 100.0 fL   MCH 26.8 26.0 - 34.0 pg   MCHC 31.8 30.0 - 36.0 g/dL   RDW 13.9 11.5 - 15.5 %   Platelets 281 150 - 400 K/uL   nRBC 0.0 0.0 - 0.2 %    Comment: Performed at [facility], [address], [place], [place] [address]  Basic metabolic panel     Status: Abnormal   Collection Time: 09/04/18  4:45 AM  Result Value Ref Range   Sodium 132 (L) 135 - 145 mmol/L   Potassium 3.6 3.5 - 5.1 mmol/L   Chloride 97 (L) 98 - 111 mmol/L   CO2 26 22 - 32 mmol/L   Glucose, Bld 123 (H) 70 - 99 mg/dL   BUN 30 (H) 8 - 23 mg/dL   Creatinine, Ser 1.27 (H) 0.61 - 1.24 mg/dL   Calcium 8.7 (L) 8.9 - 10.3 mg/dL   GFR calc non Af Amer 52 (L) >60 mL/min   GFR calc Af Amer >60 >60 mL/min   Anion gap 9 5 - 15    Comment: Performed at [facility], [address], [place],  [place] [address]  Glucose, capillary     Status: Abnormal   Collection Time: 09/04/18  7:41 AM  Result Value Ref Range   Glucose-Capillary 119 (H) 70 - 99 mg/dL   Comment 1 Notify RN    Comment 2 Document in Chart   Glucose, capillary     Status: Abnormal   Collection Time: 09/04/18 11:53 AM  Result Value Ref Range   Glucose-Capillary 161 (H) 70 - 99 mg/dL   Comment 1 Notify RN    Comment 2 Document in Chart    Glucose, capillary     Status: Abnormal   Collection Time: 09/04/18  4:16 PM  Result Value Ref Range   Glucose-Capillary 136 (H) 70 - 99 mg/dL   Comment 1 Notify RN    Comment 2 Document in Chart   Glucose, capillary     Status: Abnormal   Collection Time: 09/04/18 10:48 PM  Result Value Ref Range   Glucose-Capillary 159 (H) 70 - 99 mg/dL  Heparin level (unfractionated)     Status: None   Collection Time: 09/05/18  5:40 AM  Result Value Ref Range   Heparin Unfractionated 0.65 0.30 - 0.70 IU/mL    Comment: (NOTE) If heparin results are below expected values, and patient dosage has  been confirmed, suggest follow up testing of antithrombin III levels. Performed at [facility], [address] [place], [place] [address]   CBC     Status: Abnormal   Collection Time: 09/05/18  5:40 AM  Result Value Ref Range   WBC 7.1 4.0 - 10.5 K/uL   RBC 3.68 (L) 4.22 - 5.81 MIL/uL   Hemoglobin 9.7 (L) 13.0 - 17.0 g/dL   HCT 31.1 (L) 39.0 - 52.0 %   MCV 84.5 80.0 - 100.0 fL   MCH 26.4 26.0 - 34.0 pg   MCHC 31.2 30.0 - 36.0 g/dL   RDW 13.6 11.5 - 15.5 %   Platelets 285 150 - 400 K/uL   nRBC 0.0 0.0 - 0.2 %    Comment: Performed at [facility], [address] [place], [place] [address]  Glucose, capillary     Status: Abnormal   Collection Time: 09/05/18  7:17 AM  Result Value Ref Range   Glucose-Capillary 144 (H) 70 - 99 mg/dL  Glucose, capillary     Status: Abnormal   Collection Time: 09/05/18 11:21 AM  Result Value Ref Range   Glucose-Capillary 111 (H) 70 - 99 mg/dL  Basic metabolic panel     Status: Abnormal   Collection Time: 09/08/18 11:10 AM  Result Value Ref Range   Sodium 130 (L) 135 - 145 mmol/L   Potassium 5.3 (H) 3.5 - 5.1 mmol/L   Chloride 96 (L) 98 - 111 mmol/L   CO2 26 22 - 32 mmol/L   Glucose, Bld 126 (H) 70 - 99 mg/dL   BUN 24 (H) 8 - 23 mg/dL   Creatinine, Ser 0.98 0.61 - 1.24 mg/dL   Calcium 9.4 8.9 - 10.3 mg/dL   GFR calc non Af Amer >60 >60 mL/min   GFR calc Af  Amer >60 >60 mL/min   Anion gap 8 5 - 15    Comment: Performed at [facility] [address], [place], [place] [address]  Brain natriuretic peptide     Status: None   Collection Time: 09/08/18 11:10 AM  Result Value Ref Range   B Natriuretic Peptide 81.0 0.0 - 100.0 pg/mL    Comment: Performed at [facility], [place]  [address] [place], [place] [address]  CBC with Differential     Status: Abnormal   Collection Time: 09/08/18 11:10 AM  Result Value Ref Range   WBC 6.9 4.0 - 10.5 K/uL   RBC 3.77 (L) 4.22 - 5.81 MIL/uL   Hemoglobin 10.3 (L) 13.0 - 17.0 g/dL   HCT 33.2 (L) 39.0 - 52.0 %   MCV 88.1 80.0 - 100.0 fL   MCH 27.3 26.0 - 34.0 pg   MCHC 31.0 30.0 - 36.0 g/dL   RDW 13.5 11.5 - 15.5 %   Platelets 256 150 - 400 K/uL   nRBC 0.0 0.0 - 0.2 %   Neutrophils Relative % 68 %   Neutro Abs 4.7 1.7 - 7.7 K/uL   Lymphocytes Relative 12 %   Lymphs Abs 0.8 0.7 - 4.0 K/uL   Monocytes Relative 12 %   Monocytes Absolute 0.8 0.1 - 1.0 K/uL   Eosinophils Relative 7 %   Eosinophils Absolute 0.5 0.0 - 0.5 K/uL   Basophils Relative 1 %   Basophils Absolute 0.1 0.0 - 0.1 K/uL   Immature Granulocytes 0 %   Abs Immature Granulocytes 0.03 0.00 - 0.07 K/uL    Comment: Performed at [facility], [address], [place], [place] [address]  Troponin I - ONCE - STAT     Status: None   Collection Time: 09/08/18 11:10 AM  Result Value Ref Range   Troponin I <0.03 <0.03 ng/mL    Comment: Performed at [facility], [address], [place], [place] [address]    RADIOGRAPHIC STUDIES: I have personally reviewed the radiological images as listed and agreed with the findings in the report. Dg Chest 2 View  Result Date: 09/08/2018 CLINICAL DATA:  Patient with cardiac stents.  Shortness of breath EXAM: CHEST - 2 VIEW COMPARISON:  Chest radiograph 09/04/2018 FINDINGS: Monitoring leads overlie the patient. Stable cardiomegaly. Persistent moderate left and small right pleural effusions with underlying opacities. No  pneumothorax. Thoracic spine degenerative changes. IMPRESSION: Persistent moderate left and small right effusions with underlying opacities. Electronically Signed   By: Lovey Newcomer M.D.   On: 09/08/2018 10:58   Dg Chest 2 View  Result Date: 09/04/2018 CLINICAL DATA:  Shortness of breath, bilateral pleural effusions, CHF EXAM: CHEST - 2 VIEW COMPARISON:  09/02/2018 FINDINGS: Moderate left and small right pleural effusions, unchanged. No frank interstitial edema.  No pneumothorax. The heart is normal in size. Degenerative changes of the visualized thoracolumbar spine. IMPRESSION: Moderate left and small right pleural effusions, unchanged. No frank interstitial edema. Electronically Signed   By: Julian Hy M.D.   On: 09/04/2018 16:14   Dg Chest 2 View  Result Date: 09/02/2018 CLINICAL DATA:  Shortness of breath and cough EXAM: CHEST - 2 VIEW COMPARISON:  02/02/2017 FINDINGS: Cardiac shadow is stable. The lungs are well aerated bilaterally. Bilateral pleural effusions are noted left greater than right. Degenerative changes of the thoracic spine are noted. IMPRESSION: Bilateral pleural effusions left greater than right. Electronically Signed   By: Inez Catalina M.D.   On: 09/02/2018 12:20   US Venous Img Lower Bilateral  Result Date: 09/03/2018 CLINICAL DATA:  85 year old male with bilateral lower extremity edema and shortness of breath for the past 2 weeks. Elevated D-dimer. EXAM: BILATERAL LOWER EXTREMITY VENOUS DOPPLER ULTRASOUND TECHNIQUE: Gray-scale sonography with graded compression, as well as color Doppler and duplex ultrasound were performed to evaluate the lower extremity deep venous systems from the level of the common femoral vein and including the common  femoral, femoral, profunda femoral, popliteal and calf veins including the posterior tibial, peroneal and gastrocnemius veins when visible. The superficial great saphenous vein was also interrogated. Spectral Doppler was utilized to evaluate  flow at rest and with distal augmentation maneuvers in the common femoral, femoral and popliteal veins. COMPARISON:  None. FINDINGS: RIGHT LOWER EXTREMITY Common Femoral Vein: No evidence of thrombus. Normal compressibility, respiratory phasicity and response to augmentation. Saphenofemoral Junction: No evidence of thrombus. Normal compressibility and flow on color Doppler imaging. Profunda Femoral Vein: No evidence of thrombus. Normal compressibility and flow on color Doppler imaging. Femoral Vein: No evidence of thrombus. Normal compressibility, respiratory phasicity and response to augmentation. Popliteal Vein: No evidence of thrombus. Normal compressibility, respiratory phasicity and response to augmentation. Calf Veins: Positive color flow on color Doppler evaluation. Possible nonocclusive thrombus visualized within a short segment of the posterior tibial vein. Superficial Great Saphenous Vein: No evidence of thrombus. Normal compressibility. Venous Reflux:  None. Other Findings:  None. LEFT LOWER EXTREMITY Common Femoral Vein: No evidence of thrombus. Normal compressibility, respiratory phasicity and response to augmentation. Saphenofemoral Junction: No evidence of thrombus. Normal compressibility and flow on color Doppler imaging. Profunda Femoral Vein: No evidence of thrombus. Normal compressibility and flow on color Doppler imaging. Femoral Vein: No evidence of thrombus. Normal compressibility, respiratory phasicity and response to augmentation. Popliteal Vein: No evidence of thrombus. Normal compressibility, respiratory phasicity and response to augmentation. Calf Veins: No evidence of thrombus. Normal compressibility and flow on color Doppler imaging. Superficial Great Saphenous Vein: No evidence of thrombus. Normal compressibility. Venous Reflux:  None. Other Findings:  None. IMPRESSION: 1. Possible nonocclusive thrombus of indeterminate acuity within 1 of the paired posterior tibial veins in the right  calf. 2. Otherwise, no evidence of acute deep or superficial venous thrombosis in either lower extremity. Electronically Signed   By: Jacqulynn Cadet M.D.   On: 09/03/2018 10:41   I have reviewed Francene Finders, NP's note and agree with the documentation.  I personally performed a face-to-face visit, made revisions and my assessment and plan is as follows.  ASSESSMENT & PLAN:  Normochromic normocytic anemia 1.  Normocytic anemia: - Had a hemoglobin of 9.7 in September 2019 followed by GI work-up. - EGD on 05/18/2018 showed erosive reflex esophagitis, erosive gastropathy.  Biopsy was consistent with chronic active gastritis with H. pylori.  It was thought that he could be bleeding from upper GI. -Colonoscopy on 05/18/2018 shows reticulosis of the sigmoid colon, descending colon and transverse colon.  Tubular adenoma from hepatic flexure was resected. - Admitted to the hospital on 09/02/2017, diagnosed with possible nonocclusive thrombus of indeterminate acuity within 1 of the paired posterior tibial veins in the right calf.  He was started on Eliquis.  He was also found to have a hemoglobin of 7.7.  Ferritin was 75 and percent saturation was 9.  Denies any history of blood transfusion.  Denies any B symptoms in the last 6 months. -We will repeat CBC today, review of smear.  We will check for nutritional deficiencies including ferritin, iron panel, E75, folic acid, and copper levels.  We will check stool for occult blood.  We will rule out hemolysis by checking LDH, reticulocyte count, direct Coombs test, haptoglobin.  We will also check serum protein electro pheresis. -I will see him back in 1 week for follow-up.  2.  Distal DVT: - Ultrasound on 09/03/2018 showed possible nonocclusive thrombus of indeterminate acuity within 1 of the.  The posterior tibial veins in the  right calf. -Currently on Eliquis 5 mg twice daily and tolerating well.  A CT scan of the chest PE protocol could not be done as he is  unable to lie flat for the last 7 days.     All questions were answered. The patient knows to call the clinic with any problems, questions or concerns.     Derek Jack, MD 09/09/18 3:38 PM

## 2018-09-09 NOTE — Assessment & Plan Note (Addendum)
1.  Normocytic anemia: - Had a hemoglobin of 9.7 in September 2019 followed by GI work-up. - EGD on 05/18/2018 showed erosive reflex esophagitis, erosive gastropathy.  Biopsy was consistent with chronic active gastritis with H. pylori.  It was thought that he could be bleeding from upper GI. -Colonoscopy on 05/18/2018 shows reticulosis of the sigmoid colon, descending colon and transverse colon.  Tubular adenoma from hepatic flexure was resected. - Admitted to the hospital on 09/02/2017, diagnosed with possible nonocclusive thrombus of indeterminate acuity within 1 of the paired posterior tibial veins in the right calf.  He was started on Eliquis.  He was also found to have a hemoglobin of 7.7.  Ferritin was 75 and percent saturation was 9.  Denies any history of blood transfusion.  Denies any B symptoms in the last 6 months. -We will repeat CBC today, review of smear.  We will check for nutritional deficiencies including ferritin, iron panel, Q46, folic acid, and copper levels.  We will check stool for occult blood.  We will rule out hemolysis by checking LDH, reticulocyte count, direct Coombs test, haptoglobin.  We will also check serum protein electro pheresis. -I will see him back in 1 week for follow-up.  2.  Distal DVT: - Ultrasound on 09/03/2018 showed possible nonocclusive thrombus of indeterminate acuity within 1 of the.  The posterior tibial veins in the right calf. -Currently on Eliquis 5 mg twice daily and tolerating well.  A CT scan of the chest PE protocol could not be done as he is unable to lie flat for the last 7 days.

## 2018-09-12 ENCOUNTER — Other Ambulatory Visit (HOSPITAL_COMMUNITY): Payer: Self-pay

## 2018-09-12 DIAGNOSIS — D649 Anemia, unspecified: Secondary | ICD-10-CM | POA: Diagnosis not present

## 2018-09-12 DIAGNOSIS — Z7901 Long term (current) use of anticoagulants: Secondary | ICD-10-CM | POA: Diagnosis not present

## 2018-09-12 DIAGNOSIS — I10 Essential (primary) hypertension: Secondary | ICD-10-CM | POA: Diagnosis not present

## 2018-09-12 DIAGNOSIS — I824Z1 Acute embolism and thrombosis of unspecified deep veins of right distal lower extremity: Secondary | ICD-10-CM | POA: Diagnosis not present

## 2018-09-12 DIAGNOSIS — E119 Type 2 diabetes mellitus without complications: Secondary | ICD-10-CM | POA: Diagnosis not present

## 2018-09-12 DIAGNOSIS — Z87891 Personal history of nicotine dependence: Secondary | ICD-10-CM | POA: Diagnosis not present

## 2018-09-12 LAB — OCCULT BLOOD X 1 CARD TO LAB, STOOL
Fecal Occult Bld: NEGATIVE
Fecal Occult Bld: NEGATIVE
Fecal Occult Bld: NEGATIVE

## 2018-09-12 LAB — PROTEIN ELECTROPHORESIS, SERUM
A/G Ratio: 1 (ref 0.7–1.7)
Albumin ELP: 3.4 g/dL (ref 2.9–4.4)
Alpha-1-Globulin: 0.3 g/dL (ref 0.0–0.4)
Alpha-2-Globulin: 1 g/dL (ref 0.4–1.0)
Beta Globulin: 1 g/dL (ref 0.7–1.3)
Gamma Globulin: 1.2 g/dL (ref 0.4–1.8)
Globulin, Total: 3.5 g/dL (ref 2.2–3.9)
TOTAL PROTEIN ELP: 6.9 g/dL (ref 6.0–8.5)

## 2018-09-13 ENCOUNTER — Ambulatory Visit (INDEPENDENT_AMBULATORY_CARE_PROVIDER_SITE_OTHER): Payer: Medicare Other | Admitting: Internal Medicine

## 2018-09-13 ENCOUNTER — Encounter: Payer: Self-pay | Admitting: Internal Medicine

## 2018-09-13 ENCOUNTER — Ambulatory Visit (INDEPENDENT_AMBULATORY_CARE_PROVIDER_SITE_OTHER)
Admission: RE | Admit: 2018-09-13 | Discharge: 2018-09-13 | Disposition: A | Discharge status: Home / Self Care | Payer: Medicare Other | Source: Ambulatory Visit | Attending: Internal Medicine | Admitting: Internal Medicine

## 2018-09-13 VITALS — BP 128/50 | HR 69 | Ht 70.0 in | Wt 206.4 lb

## 2018-09-13 DIAGNOSIS — J90 Pleural effusion, not elsewhere classified: Secondary | ICD-10-CM | POA: Diagnosis not present

## 2018-09-13 DIAGNOSIS — I824Z1 Acute embolism and thrombosis of unspecified deep veins of right distal lower extremity: Secondary | ICD-10-CM

## 2018-09-13 DIAGNOSIS — R0609 Other forms of dyspnea: Secondary | ICD-10-CM | POA: Insufficient documentation

## 2018-09-13 DIAGNOSIS — R05 Cough: Secondary | ICD-10-CM | POA: Diagnosis not present

## 2018-09-13 DIAGNOSIS — R0602 Shortness of breath: Secondary | ICD-10-CM | POA: Diagnosis not present

## 2018-09-13 NOTE — Patient Instructions (Signed)
Please remember to go to the  x-ray department  for your tests - we will call you with the results when they are available    Please schedule a follow up office visit in 6 weeks, call sooner if needed with cxr

## 2018-09-13 NOTE — Progress Notes (Signed)
Jerry Burns, male    DOB: [birth year]      MRN: [id number]   Brief patient profile:  68 yowm quit smoking 2009 s apparent sequelae but middle July 2019 acute weakness /sob and eval by cards and pcp with dx of anemia> gi w/u 05/18/18  diverticulosis, sessile colon polys, severe esophagitis ? NSAID effects> not really better despite improved  around 1st Feb new onset orthopnea with mild cough and L > R swelling >  [facility] admitted 09/02/2018:   Admit date: 09/02/2018 Discharge date: 09/05/2018  Admitted From: Home  Disposition: Home   Recommendations for Outpatient Follow-up:  1. Follow up with PCP in 1 weeks 2. Follow up with cardiology in 1-2 weeks 3. Anticoagulate for 3-6 months for PE/DVT 4. Please obtain BMP/CBC in 1 week to follow up.  5. Monitor for bleeding complications.   Discharge Condition: STABLE   CODE STATUS: FULL    Brief Hospitalization Summary: Please see all hospital notes, images, labs for full details of the hospitalization. Dr. Toney Sang HPI: Jerry Burns a84 y.o.male,history of hypertension, diabetes mellitus type 2, hyperlipidemia, coronary artery disease status post stent to theproximal LAD,Came to hospital with worsening shortness of breath.Patient says that for the past 2 weeks shortness of breath has gotten worse. And he is too dyspneic to lie flat and had to sit up all night.  He admits to coughing up clear phlegm. No fever but has been feeling cold. Denies nausea vomiting or diarrhea. Denies chest pain.  He went to his PCP today where fingerstick hemoglobin showed hemoglobin of 7.7 whereas CBC showed hemoglobin around 11.  In the ED patient was given Lasix 40 mg IV with excellent diuretic response.  He was found to have bilateral lower extremity swelling left more than right, concerning for venous thromboembolism. CTA chest ordered but could not be done as patient unable to lie flat. IV heparin started in the ED empirically for presumed  PE.        History of Present Illness  09/13/2018  Pulmonary/ 1st office eval/Selby Slovacek  Chief Complaint  Patient presents with  . Pulmonary Consult    Referred by Dr. Wolfgang Phoenix. Pt c/o DOE x 6 wks. He feels that it's improving.   Dyspnea:  MMRC2 = can't walk a nl pace on a flat grade s sob but does fine slow and flat [employer] shopping Cough: almost gone Sleep: still in recliner but down to incline 60% says this is def  improved  SABA use: no better with sba    No obvious day to day or daytime variability or assoc excess/ purulent sputum or mucus plugs or hemoptysis or cp or chest tightness, subjective wheeze or overt sinus or hb symptoms.     Also denies any obvious fluctuation of symptoms with weather or environmental changes or other aggravating or alleviating factors except as outlined above   No unusual exposure hx or h/o childhood pna/ asthma or knowledge of premature birth.  Current Allergies, Complete Past Medical History, Past Surgical History, Family History, and Social History were reviewed in [employer] record.  ROS  The following are not active complaints unless bolded Hoarseness, sore throat, dysphagia, dental problems, itching, sneezing,  nasal congestion or discharge of excess mucus or purulent secretions, ear ache,   fever, chills, sweats, unintended wt loss or wt gain, classically pleuritic or exertional cp,  orthopnea pnd or arm/hand swelling  or leg swelling, presyncope, palpitations, abdominal pain, anorexia, nausea, vomiting, diarrhea  or change in  bowel habits or change in bladder habits, change in stools or change in urine, dysuria, hematuria,  rash, arthralgias, visual complaints, headache, numbness, weakness or ataxia or problems with walking or coordination,  change in mood or  memory.           Past Medical History:  Diagnosis Date  . Anginal pain ([place])   . Arthritis    Bilateral knee  . Coronary artery disease   . Diabetes mellitus     insulin dependent   . DVT, lower extremity, distal, acute ([place]) 09/09/2018  . Hyperlipidemia   . Hypertension   . Progressive angina ([place]) 03/2015   3.0x12 mm Resolute DES to pLAD, Angiosculpt scoring balloon to dLAD  . Stroke [facility])     Outpatient Medications Prior to Visit  Medication Sig Dispense Refill  . amLODipine (NORVASC) 10 MG tablet TAKE (1) TABLET BY MOUTH ONCE DAILY. (Patient taking differently: Take 10 mg by mouth daily. ) 90 tablet 1  . atorvastatin (LIPITOR) 80 MG tablet Take 1 tablet (80 mg total) by mouth daily. 90 tablet 0  . carvedilol (COREG) 12.5 MG tablet TAKE (1) TABLET BY MOUTH TWICE DAILY. 180 tablet 4  . COMBIGAN 0.2-0.5 % ophthalmic solution Place 1 drop into the left eye 2 (two) times daily.   2  . dextromethorphan (DELSYM) 30 MG/5ML liquid Take 5 mLs (30 mg total) by mouth 2 (two) times daily as needed for cough. 89 mL 0  . dutasteride (AVODART) 0.5 MG capsule TAKE ONE CAPSULE BY MOUTH ONCE DAILY. 90 capsule 1  . ELIQUIS DVT/PE STARTER PACK (ELIQUIS STARTER PACK) 5 MG TABS Take as directed on package: start with two-5mg  tablets twice daily for 7 days. On day 8, switch to one-5mg  tablet twice daily. 1 each 0  . Emollient (CERAVE) CREA Apply 1 application topically daily as needed (dry skin).    . Flaxseed, Linseed, (FLAXSEED OIL) 1200 MG CAPS Take 1,200 mg by mouth daily.    . Insulin Glargine (LANTUS SOLOSTAR) 100 UNIT/ML Solostar Pen INJECT 22 UNITS SUBCUTANEOUSLY ONCE DAILY AT  10  PM (Patient taking differently: Inject 17-25 Units into the skin at bedtime. ) 15 mL 5  . metFORMIN (GLUCOPHAGE) 1000 MG tablet TAKE (1) TABLET BY MOUTH TWICE A DAY WITH A MEAL. 180 tablet 0  . Multiple Vitamin (MULTIVITAMIN) tablet Take 1 tablet by mouth daily.    . nitroGLYCERIN (NITROSTAT) 0.4 MG SL tablet PLACE 1 TAB UNDER TONGUE EVERY 5 MIN IF NEEDED FOR CHEST PAIN. MAY USE 3 TIMES.NO RELIEF CALL 911. 25 tablet 3  . pantoprazole (PROTONIX) 40 MG tablet Take 40 mg by mouth. 45  mins   before supper    . Polyvinyl Alcohol-Povidone (REFRESH OP) Place 1 drop into the left eye 2 (two) times daily.    . psyllium (METAMUCIL) 58.6 % powder Take 1 packet by mouth daily.     . quinapril (ACCUPRIL) 10 MG tablet Take 1 tablet (10 mg total) by mouth daily. 90 tablet 4  . sitaGLIPtin (JANUVIA) 100 MG tablet Take 1 tablet (100 mg total) by mouth daily. 30 tablet 0  . tamsulosin (FLOMAX) 0.4 MG CAPS capsule Take 1 capsule (0.4 mg total) by mouth daily. 90 capsule 0  . torsemide (DEMADEX) 20 MG tablet Take 1 tablet (20 mg total) by mouth every morning. 30 tablet 2  . Turmeric 500 MG CAPS Take 500 mg by mouth daily.    Marland Kitchen aspirin EC 81 MG tablet Take 81 mg by mouth  daily.        Objective:     BP (!) 128/50 (BP Location: Left Arm, Cuff Size: Normal)   Pulse 69   Ht 5\' 10"  (1.778 m)   Wt 206 lb 6.4 oz (93.6 kg)   SpO2 98%   BMI 29.62 kg/m   SpO2: 98 %  RA   Wt Readings from Last 3 Encounters:  09/13/18 206 lb 6.4 oz (93.6 kg)  09/09/18 215 lb (97.5 kg)  09/09/18 213 lb (96.6 kg)      Pleasant amb wm nad   HEENT: nl dentition, turbinates bilaterally, and oropharynx. Nl external ear canals without cough reflex   NECK :  without JVD/Nodes/TM/ nl carotid upstrokes bilaterally   LUNGS: no acc muscle use,  Nl contour chest with decreaesed bs/ dullness in bases L >R   CV:  RRR  no s3 or murmur or increase in P2, and  1+ edema lower ext L > R  ABD:  soft and nontender with nl inspiratory excursion in the supine position. No bruits or organomegaly appreciated, bowel sounds nl  MS:  Nl gait/ ext warm without deformities, calf tenderness, cyanosis or clubbing No obvious joint restrictions   SKIN: warm and dry without lesions    NEURO:  alert, approp, nl sensorium with  no motor or cerebellar deficits apparent.      CXR PA and Lateral:   09/13/2018 :    I personally reviewed images  impression as follows:   Bilateral effusions loculated on L and much larger than R     labs reviewed 09/13/2018  Lab Results  Component Value Date   HGB 10.3 (L) 09/09/2018   HGB 10.3 (L) 09/08/2018   HGB 9.7 (L) 09/05/2018   HGB 10.4 (L) 05/10/2018   HGB 10.0 (L) 04/04/2018   HGB 10.7 (L) 03/11/2018            Assessment   DOE (dyspnea on exertion) Onset July 2019 while on baby asa then much worse since the first of 2020  Echo 09/03/2018  1. The left ventricle has normal systolic function of 40-98%. The cavity size was normal. There is no increased left ventricular wall thickness. Echo evidence of impaired diastolic relaxation Normal left ventricular filling pressures.  2. The right ventricle has normal systolic function. The cavity was normal. There is no increase in right ventricular wall thickness.  3. Left atrial size was mildly dilated.  4. The mitral valve is normal in structure. No evidence of mitral valve stenosis.  5. The tricuspid valve is normal in structure.  6. The aortic valve is tricuspid.  7. The aortic root is normal in size and structure -  Venous dopplers pos on R with L > R effusions and unable to lie flat for CTa with d dimer 4.68  - 09/13/2018 feeling better on DOAC /demadex    It is not clear to me even in retrospect when he began having either the effusions or the blood clots and whether or not they are related to one another but I strongly suspect that they are based on how much better he feels since he was started on anticoagulation, despite the fact that he still has significant residual effusions.  Since he is feeling better at this point I do not recommend any change in therapy.  Specifically, we would have to stop his anticoagulation to do any form of thoracentesis and note that the fluid appears quite loculated(and may not therefore be easily drainable) .  Also unable to do any further CT scanning based on the fact that he can only lie back at 60 degrees now.  Since he has documented DVT it would not change our management to know that he  has PE/infarcts  and thankfully there is no evidence of right ventricular strain or failure so his prognosis should actually be quite good.  One strong caveat here is that we need to make sure if he has any worsening doe that it is not related to anemia from GI blood loss from being on anticoagulation.  I defer the management of his esophagitis to Dr. Sydell Axon       Pleural effusion, bilateral  L > R first detected 09/02/2018 in setting of dx of dvt The fact that these effusions look loculated especially on the left is consistent with para-embolic mechanism.  The fact that he feels so much better despite persistent effusions is also a strong indication that PE was the cause of his dyspnea.  The natural history of infarct related effusions is 1 of slow clearing over a period of 6 to 8 weeks and so I will see him back at that point for follow-up chest x-ray but of course sooner should he deteriorate in any way.  Should we decide to do any form of thoracentesis the anticoagulation would of course need to be held for at least a day and it would definitely need to be done under ultrasound.   Discussed in detail all the  indications, usual  risks and alternatives  relative to the benefits with patient who agrees to proceed with conservative f/u as outlined         DVT, lower extremity, distal, acute ([place]) Main risk factor appears to be obesity and inactivity but I do not really sense that there was a clear provocation.  I would recommend at least 6 months of therapy if he can tolerate it and then repeat the venous studies at that point but this can be coordinated through his primary care provider unless otherwise requested.  After 6 months consideration could be made for maintaining on low-dose prophylactic rx = Eliquis 2.5 mg twice daily.  Discussed in detail all the  indications, usual  risks and alternatives  relative to the benefits with patient who agrees to proceed with rx as outlined.        Total time devoted to counseling  > 50 % of initial 60 min office visit:  review case with pt/ discussion of options/alternatives/ personally creating written customized instructions  in presence of pt  then going over those specific  Instructions directly with the pt including how to use all of the meds but in particular covering each new medication in detail and the difference between the maintenance= "automatic" meds and the prns using an action plan format for the latter (If this problem/symptom => do that organization reading Left to right).  Please see AVS from this visit for a full list of these instructions which I personally wrote for this pt and  are unique to this visit.        Christinia Gully, MD 09/13/2018

## 2018-09-14 ENCOUNTER — Encounter: Payer: Self-pay | Admitting: Internal Medicine

## 2018-09-14 ENCOUNTER — Telehealth: Payer: Self-pay | Admitting: Internal Medicine

## 2018-09-14 DIAGNOSIS — J90 Pleural effusion, not elsewhere classified: Secondary | ICD-10-CM | POA: Insufficient documentation

## 2018-09-14 NOTE — Telephone Encounter (Signed)
Notes recorded by Tanda Rockers, MD on 09/14/2018 at 5:22 AM EST Call pt: Reviewed cxr and there has been minimal improvement but as long as he is feeling better would not change my recs as it may take 6-8 weeks to see radiographic improvement   Left message for patient to call back for results.

## 2018-09-14 NOTE — Assessment & Plan Note (Signed)
The fact that these effusions look loculated especially on the left is consistent with para-embolic mechanism.  The fact that he feels so much better despite persistent effusions is also a strong indication that PE was the cause of his dyspnea.  The natural history of infarct related effusions is 1 of slow clearing over a period of 6 to 8 weeks and so I will see him back at that point for follow-up chest x-ray but of course sooner should he deteriorate in any way.  Should we decide to do any form of thoracentesis the anticoagulation would of course need to be held for at least a day and it would definitely need to be done under ultrasound.   Discussed in detail all the  indications, usual  risks and alternatives  relative to the benefits with patient who agrees to proceed with conservative f/u as outlined

## 2018-09-14 NOTE — Telephone Encounter (Signed)
Notes recorded by Tanda Rockers, MD on 09/14/2018 at 5:22 AM EST Call pt: Reviewed cxr and there has been minimal improvement but as long as he is feeling better would not change my recs as it may take 6-8 weeks to see radiographic improvement  Patient aware nothing further is needed at this time.

## 2018-09-14 NOTE — Progress Notes (Signed)
LMTCB

## 2018-09-14 NOTE — Telephone Encounter (Signed)
Pt is calling back [phone]

## 2018-09-14 NOTE — Assessment & Plan Note (Addendum)
Onset July 2019 while on baby asa then much worse since the first of 2020  Echo 09/03/2018  1. The left ventricle has normal systolic function of 49-17%. The cavity size was normal. There is no increased left ventricular wall thickness. Echo evidence of impaired diastolic relaxation Normal left ventricular filling pressures.  2. The right ventricle has normal systolic function. The cavity was normal. There is no increase in right ventricular wall thickness.  3. Left atrial size was mildly dilated.  4. The mitral valve is normal in structure. No evidence of mitral valve stenosis.  5. The tricuspid valve is normal in structure.  6. The aortic valve is tricuspid.  7. The aortic root is normal in size and structure -  Venous dopplers pos on R with L > R effusions and unable to lie flat for CTa with d dimer 4.68  - 09/13/2018 feeling better on DOAC /demadex    It is not clear to me even in retrospect when he began having either the effusions or the blood clots and whether or not they are related to one another but I strongly suspect that they are based on how much better he feels since he was started on anticoagulation, despite the fact that he still has significant residual effusions.  Since he is feeling better at this point I do not recommend any change in therapy.  Specifically, we would have to stop his anticoagulation to do any form of thoracentesis and note that the fluid appears quite loculated.  Also unable to do any further CT scanning based on the fact that he can only lie back at 60 degrees now.  Since he has documented DVT it would not change our management to know that he has PE and thankfully there is no evidence of right ventricular strain or failure so his prognosis should actually be quite good.  One strong caveat here is that we need to make sure if he has any worsening doe that it is not related to anemia from GI blood loss from being on anticoagulation.  I defer the management of his  esophagitis to Dr. Sydell Axon

## 2018-09-14 NOTE — Assessment & Plan Note (Signed)
Main risk factor appears to be obesity and inactivity but I do not really sense that there was a clear provocation.  I would recommend at least 6 months of therapy if he can tolerate it and then repeat the venous studies at that point but this can be coordinated through his primary care provider unless otherwise requested.  After 6 months consideration could be made for maintaining on low-dose prophylactic rx = Eliquis 2.5 mg twice daily.  Discussed in detail all the  indications, usual  risks and alternatives  relative to the benefits with patient who agrees to proceed with rx as outlined.    Total time devoted to counseling  > 50 % of initial 60 min office visit:  review case with pt/ discussion of options/alternatives/ personally creating written customized instructions  in presence of pt  then going over those specific  Instructions directly with the pt including how to use all of the meds but in particular covering each new medication in detail and the difference between the maintenance= "automatic" meds and the prns using an action plan format for the latter (If this problem/symptom => do that organization reading Left to right).  Please see AVS from this visit for a full list of these instructions which I personally wrote for this pt and  are unique to this visit.

## 2018-09-15 ENCOUNTER — Encounter: Payer: Self-pay | Admitting: Family Medicine

## 2018-09-15 ENCOUNTER — Ambulatory Visit (INDEPENDENT_AMBULATORY_CARE_PROVIDER_SITE_OTHER): Payer: Medicare Other | Admitting: Family Medicine

## 2018-09-15 VITALS — BP 118/62 | Wt 202.8 lb

## 2018-09-15 DIAGNOSIS — R0609 Other forms of dyspnea: Secondary | ICD-10-CM

## 2018-09-15 DIAGNOSIS — R5383 Other fatigue: Secondary | ICD-10-CM | POA: Diagnosis not present

## 2018-09-15 DIAGNOSIS — D649 Anemia, unspecified: Secondary | ICD-10-CM

## 2018-09-15 MED ORDER — APIXABAN 5 MG PO TABS: ORAL_TABLET | ORAL | 4 refills | Status: DC

## 2018-09-15 NOTE — Progress Notes (Signed)
   Subjective:    Patient ID: Jerry Burns, male    DOB: 1934/02/22, 85 y.o.   MRN: [id number]  HPI Pt here today for one week follow up on DOE. Pt states he is getting progressively better.  Patient is able now to lay down.  He is on Eliquis to cover for DVT in his right leg along with presumed pulmonary embolus He also had loculated fluid pulmonologist does not recommend removal of this He is following up with hematology as well Patient feels he is doing better.  Breathing better.  Denies any major issues.  Review of Systems  Constitutional: Negative for diaphoresis and fatigue.  HENT: Negative for congestion and rhinorrhea.   Respiratory: Negative for cough and shortness of breath.   Cardiovascular: Negative for chest pain and leg swelling.  Gastrointestinal: Negative for abdominal pain and diarrhea.  Skin: Negative for color change and rash.  Neurological: Negative for dizziness and headaches.  Psychiatric/Behavioral: Negative for behavioral problems and confusion.       Objective:   Physical Exam Vitals signs reviewed.  Cardiovascular:     Rate and Rhythm: Normal rate and regular rhythm.     Heart sounds: Normal heart sounds. No murmur.  Pulmonary:     Effort: Pulmonary effort is normal.     Breath sounds: Normal breath sounds.  Lymphadenopathy:     Cervical: No cervical adenopathy.  Neurological:     Mental Status: He is alert.  Psychiatric:        Behavior: Behavior normal.           Assessment & Plan:  We will check CBC in several weeks to make sure his hemoglobin staying stable Continue Eliquis 5 mg twice daily for at least the next 5 to 6 months At that point time we will pursue doing a d-dimer as well as ultrasound in if either 1 of these are abnormal at that point in time we will also incorporate hematology into the equation Follow-up with pulmonary as planned Continue to follow-up with cardiology as planned Follow-up here in 1 month sooner if any  problems

## 2018-09-19 ENCOUNTER — Telehealth: Payer: Self-pay

## 2018-09-19 NOTE — Telephone Encounter (Signed)
Request from [place] needs signature on pt test strips. I filled out what I could. Form in your box in your office.

## 2018-09-20 NOTE — Telephone Encounter (Signed)
Was filled out thanks

## 2018-09-20 NOTE — Telephone Encounter (Signed)
Faxed

## 2018-09-22 ENCOUNTER — Encounter: Payer: Self-pay | Admitting: Student

## 2018-09-22 ENCOUNTER — Ambulatory Visit (INDEPENDENT_AMBULATORY_CARE_PROVIDER_SITE_OTHER): Payer: Medicare Other | Admitting: Student

## 2018-09-22 VITALS — BP 106/58 | HR 66 | Ht 71.0 in | Wt 202.0 lb

## 2018-09-22 DIAGNOSIS — E785 Hyperlipidemia, unspecified: Secondary | ICD-10-CM

## 2018-09-22 DIAGNOSIS — I2510 Atherosclerotic heart disease of native coronary artery without angina pectoris: Secondary | ICD-10-CM | POA: Diagnosis not present

## 2018-09-22 DIAGNOSIS — I824Z1 Acute embolism and thrombosis of unspecified deep veins of right distal lower extremity: Secondary | ICD-10-CM | POA: Diagnosis not present

## 2018-09-22 DIAGNOSIS — I10 Essential (primary) hypertension: Secondary | ICD-10-CM | POA: Diagnosis not present

## 2018-09-22 DIAGNOSIS — J90 Pleural effusion, not elsewhere classified: Secondary | ICD-10-CM

## 2018-09-22 MED ORDER — QUINAPRIL HCL 5 MG PO TABS: 5.0000 mg | ORAL_TABLET | Freq: Every day | ORAL | 3 refills | Status: DC

## 2018-09-22 NOTE — Progress Notes (Signed)
Cardiology Office Note    Date:  09/22/2018   ID:  Jerry Burns, DOB March 25, 1934, MRN [id number]  PCP:  Kathyrn Drown, MD  Cardiologist: Kate Sable, MD    Chief Complaint  Patient presents with  . Hospitalization Follow-up    History of Present Illness:    Jerry Burns is a 85 y.o. male with past medical history of CAD (s/p DES to proximal LAD and balloon angioplasty to distal LAD in 2016, patent stent by repeat cath in 01/2017 with mild residual CAD), HTN, HLD, IDDM and prior CVA who presents to the office today for hospital follow-up.   He was last examined by Dr. Bronson Ing in 02/2018 and denied any recent chest pain or dyspnea on exertion at that time. BP was elevated as his dosing of Quinapril had been reduced due to hyperkalemia.  Coreg was further titrated to 12.5 mg twice daily during his visit and he was continued on ASA and statin therapy.  In the interim, he was admitted to [facility] from 09/02/2018 to 09/05/2018 for evaluation of worsening dyspnea over the past 2 weeks with a nonproductive cough. BNP was normal at 79 but D-dimer was elevated to 4.68. He was unable to lie flat for a CTA, therefore lower extremity dopplers were obtained and confirmed a DVT. He was started on Heparin which was transitioned to Eliquis prior to discharge. Repeat echocardiogram was obtained during admission and showed a preserved EF of 55 to 60% with no regional wall motion abnormalities and no RV strain. He did develop some evidence of volume overload by examination and received IV Lasix intermittently but was not started on diuretics at the time of discharge.   Was evaluated in the ED on 09/08/2018 for worsening dyspnea and CXR showed persistent moderate left and small right effusions with underlying opacities. BNP was normal at 81. EKG showed no acute findings.  He received IV Lasix but was not started on diuretics but was noted to have a tick bite with surrounding erythema and was  started on Doxycycline for prophylaxis. Appears he was evaluated by his PCP afterwards and started on Torsemide 20mg  daily. Also evaluated by Pulmonology in the interim and it was felt that his fluid was loculated and there was no indication for thoracentesis at that time. Was recommended to follow-up with GI in regards to his anemia and the indication for long-term anticoagulation.   In talking with the patient today, he reports much improvement in his dyspnea since his recent hospital discharge. He had experienced progressive fatigue over the past several months but says this continues to improve. Denies any recent chest pain or palpitations. Does have baseline 2 pillow orthopnea along with lower extremity edema. Says his edema has improved with the addition of Torsemide and weight has declined by over 15 pounds within the past few weeks.  He does report intermittent dizziness but has been unable to check his blood pressure at home when this occurs as his cuff does not work. BP is soft at 106/58 during today's visit.   Past Medical History:  Diagnosis Date  . Anginal pain ([place])   . Arthritis    Bilateral knee  . Coronary artery disease   . Diabetes mellitus    insulin dependent   . DVT, lower extremity, distal, acute ([place]) 09/09/2018  . Hyperlipidemia   . Hypertension   . Progressive angina ([place]) 03/2015   a. s/p DES to proximal LAD and balloon angioplasty to distal LAD in  2016 b. patent stent by repeat cath in 01/2017 with mild residual CAD)  . Stroke [facility])     Past Surgical History:  Procedure Laterality Date  . APPENDECTOMY    . CARDIAC CATHETERIZATION N/A 04/12/2015   Procedure: Left Heart Cath and Coronary Angiography;  Surgeon: Sherren Mocha, MD; pLAD 75>0% w/ 3.0x12 mm Resolute DES, dLAD 80>0% w/ Angiosculpt scoring balloon, CFX luminal irreg, RCA mild dz, EF 55-65%   . COLONOSCOPY  2011   RMR: 1. Normal rectum 2. Left sided diverticula , 2 ascending colon polyps, status post  snare polypectomy. Remainder of colonic mucosa appeared unremarkable.   . COLONOSCOPY N/A 05/13/2016   Procedure: COLONOSCOPY;  Surgeon: Daneil Dolin, MD;  [facility];  Service: Endoscopy;  Laterality: N/A;  7:30 AM  . COLONOSCOPY N/A 05/18/2018   Procedure: COLONOSCOPY;  Surgeon: Daneil Dolin, MD;  [facility];  Service: Endoscopy;  Laterality: N/A;  1:15pm  . CORONARY ANGIOGRAPHY N/A 02/04/2017   Procedure: CORONARY ANGIOGRAPHY (CATH LAB);  Surgeon: Troy Sine, MD;  Location: [place] CV LAB;  Service: Cardiovascular;  Laterality: N/A;  . CORONARY ANGIOPLASTY WITH STENT PLACEMENT  08/22/2014   PTCA/DES X mLAD with 2.5 x 16 mm Promus Premier DES by Dr Julianne Handler  . ESOPHAGOGASTRODUODENOSCOPY N/A 05/18/2018   Procedure: ESOPHAGOGASTRODUODENOSCOPY (EGD);  Surgeon: Daneil Dolin, MD;  [facility];  Service: Endoscopy;  Laterality: N/A;  . FOOT SURGERY Right   . LEFT HEART CATHETERIZATION WITH CORONARY ANGIOGRAM N/A 08/22/2014   Procedure: LEFT HEART CATHETERIZATION WITH CORONARY ANGIOGRAM;  Surgeon: Burnell Blanks, MD;  Location: [facility] CATH LAB;  Service: Cardiovascular;  Laterality: N/A;  . PERCUTANEOUS CORONARY STENT INTERVENTION (PCI-S)  08/22/2014   Procedure: PERCUTANEOUS CORONARY STENT INTERVENTION (PCI-S);  Surgeon: Burnell Blanks, MD;   . SHOULDER SURGERY    . TONSILLECTOMY      Current Medications: Outpatient Medications Prior to Visit  Medication Sig Dispense Refill  . amLODipine (NORVASC) 10 MG tablet TAKE (1) TABLET BY MOUTH ONCE DAILY. (Patient taking differently: Take 10 mg by mouth daily. ) 90 tablet 1  . apixaban (ELIQUIS) 5 MG TABS tablet Take one tablet by mouth twice daily 60 tablet 4  . atorvastatin (LIPITOR) 80 MG tablet Take 1 tablet (80 mg total) by mouth daily. 90 tablet 0  . carvedilol (COREG) 12.5 MG tablet TAKE (1) TABLET BY MOUTH TWICE DAILY. 180 tablet 4  . COMBIGAN 0.2-0.5 % ophthalmic solution Place 1  drop into the left eye 2 (two) times daily.   2  . dextromethorphan (DELSYM) 30 MG/5ML liquid Take 5 mLs (30 mg total) by mouth 2 (two) times daily as needed for cough. 89 mL 0  . dutasteride (AVODART) 0.5 MG capsule TAKE ONE CAPSULE BY MOUTH ONCE DAILY. 90 capsule 1  . ELIQUIS DVT/PE STARTER PACK (ELIQUIS STARTER PACK) 5 MG TABS Take as directed on package: start with two-5mg  tablets twice daily for 7 days. On day 8, switch to one-5mg  tablet twice daily. 1 each 0  . Emollient (CERAVE) CREA Apply 1 application topically daily as needed (dry skin).    . Flaxseed, Linseed, (FLAXSEED OIL) 1200 MG CAPS Take 1,200 mg by mouth daily.    . Insulin Glargine (LANTUS SOLOSTAR) 100 UNIT/ML Solostar Pen INJECT 22 UNITS SUBCUTANEOUSLY ONCE DAILY AT  10  PM (Patient taking differently: Inject 17-25 Units into the skin at bedtime. ) 15 mL 5  . metFORMIN (GLUCOPHAGE) 1000 MG tablet TAKE (  1) TABLET BY MOUTH TWICE A DAY WITH A MEAL. 180 tablet 0  . Multiple Vitamin (MULTIVITAMIN) tablet Take 1 tablet by mouth daily.    . nitroGLYCERIN (NITROSTAT) 0.4 MG SL tablet PLACE 1 TAB UNDER TONGUE EVERY 5 MIN IF NEEDED FOR CHEST PAIN. MAY USE 3 TIMES.NO RELIEF CALL 911. 25 tablet 3  . pantoprazole (PROTONIX) 40 MG tablet Take 40 mg by mouth. 45 mins   before supper    . Polyvinyl Alcohol-Povidone (REFRESH OP) Place 1 drop into the left eye 2 (two) times daily.    . psyllium (METAMUCIL) 58.6 % powder Take 1 packet by mouth daily.     . sitaGLIPtin (JANUVIA) 100 MG tablet Take 1 tablet (100 mg total) by mouth daily. 30 tablet 0  . tamsulosin (FLOMAX) 0.4 MG CAPS capsule Take 1 capsule (0.4 mg total) by mouth daily. 90 capsule 0  . torsemide (DEMADEX) 20 MG tablet Take 1 tablet (20 mg total) by mouth every morning. 30 tablet 2  . Turmeric 500 MG CAPS Take 500 mg by mouth daily.    . quinapril (ACCUPRIL) 10 MG tablet Take 1 tablet (10 mg total) by mouth daily. 90 tablet 4   No facility-administered medications prior to visit.        Allergies:   Adhesive [tape]; Sulfa antibiotics; and Latex   Social History   Socioeconomic History  . Marital status: Married    Spouse name: Not on file  . Number of children: Not on file  . Years of education: 44  . Highest education level: Not on file  Occupational History  . Occupation: retired  Scientific laboratory technician  . Financial resource strain: Not on file  . Food insecurity:    Worry: Not on file    Inability: Not on file  . Transportation needs:    Medical: Not on file    Non-medical: Not on file  Tobacco Use  . Smoking status: Former Smoker    Packs/day: 1.00    Years: 20.00    Pack years: 20.00    Types: Cigarettes, Cigars    Start date: 08/10/1955    Last attempt to quit: 07/28/2007    Years since quitting: 11.1  . Smokeless tobacco: Never Used  Substance and Sexual Activity  . Alcohol use: Not Currently    Alcohol/week: 0.0 standard drinks    Comment: Daily about 1 glass of wine  . Drug use: No  . Sexual activity: Not Currently  Lifestyle  . Physical activity:    Days per week: Not on file    Minutes per session: Not on file  . Stress: Not on file  Relationships  . Social connections:    Talks on phone: Not on file    Gets together: Not on file    Attends religious service: Not on file    Active member of club or organization: Not on file    Attends meetings of clubs or organizations: Not on file    Relationship status: Not on file  Other Topics Concern  . Not on file  Social History Narrative  . Not on file     Family History:  The patient's family history includes Colon cancer in his maternal uncle; Diabetes in his brother, father, and another family member; Hypertension in his sister; Skin cancer in his mother and sister.   Review of Systems:   Please see the history of present illness.     General:  No chills, fever, night sweats or weight  changes. Positive for fatigue.  Cardiovascular:  No chest pain, orthopnea, palpitations, paroxysmal  nocturnal dyspnea. Positive for dyspnea on exertion and edema (improving).  Dermatological: No rash, lesions/masses Respiratory: No cough, dyspnea Urologic: No hematuria, dysuria Abdominal:   No nausea, vomiting, diarrhea, bright red blood per rectum, melena, or hematemesis Neurologic:  No visual changes, wkns, changes in mental status. All other systems reviewed and are otherwise negative except as noted above.   Physical Exam:    VS:  BP (!) 106/58   Pulse 66   Ht 5\' 11"  (1.803 m)   Wt 202 lb (91.6 kg)   SpO2 96%   BMI 28.17 kg/m    General: Well developed, well nourished Caucasian male appearing in no acute distress. Head: Normocephalic, atraumatic, sclera non-icteric, no xanthomas, nares are without discharge.  Neck: No carotid bruits. JVD not elevated.  Lungs: Respirations regular and unlabored, decreased along bases bilaterally Heart: Regular rate and rhythm. No S3 or S4.  No murmur, no rubs, or gallops appreciated. Abdomen: Soft, non-tender, non-distended with normoactive bowel sounds. No hepatomegaly. No rebound/guarding. No obvious abdominal masses. Msk:  Strength and tone appear normal for age. No joint deformities or effusions. Extremities: No clubbing or cyanosis. 1+ pitting edema bilaterally.  Distal pedal pulses are 2+ bilaterally. Neuro: Alert and oriented X 3. Moves all extremities spontaneously. No focal deficits noted. Psych:  Responds to questions appropriately with a normal affect. Skin: No rashes or lesions noted  Wt Readings from Last 3 Encounters:  09/22/18 202 lb (91.6 kg)  09/15/18 202 lb 12.8 oz (92 kg)  09/13/18 206 lb 6.4 oz (93.6 kg)     Studies/Labs Reviewed:   EKG:  EKG is not ordered today.    Recent Labs: 03/11/2018: TSH 2.800 09/08/2018: B Natriuretic Peptide 81.0 09/09/2018: ALT 19; BUN 25; Creatinine, Ser 1.06; Hemoglobin 10.3; Platelets 261; Potassium 4.8; Sodium 130   Lipid Panel    Component Value Date/Time   CHOL 129 03/29/2018 0820    TRIG 102 03/29/2018 0820   HDL 46 03/29/2018 0820   CHOLHDL 2.8 03/29/2018 0820   CHOLHDL 2.9 07/31/2014 0703   VLDL 19 07/31/2014 0703   LDLCALC 63 03/29/2018 0820    Additional studies/ records that were reviewed today include:   NST: 12/2016  There was no ST segment deviation noted during stress.  Findings consistent with prior inferior/inferoseptal/inferoapical myocardial infarction with moderate peri-infarct ischemia.  This is an intermediate risk study.  The left ventricular ejection fraction is normal (55-65%).   Cardiac Catheterization: 01/2017  Prox RCA lesion, 15 %stenosed.  Dist RCA lesion, 20 %stenosed.  1st Diag lesion, 20 %stenosed.  Ost LAD-1 lesion, 40 %stenosed.  Ost LAD-2 lesion, 0 %stenosed.  Dist LAD-1 lesion, 30 %stenosed.  Dist LAD-2 lesion, 15 %stenosed.   Mild residual CAD with a patent proximal LAD stent and 30-40% narrowing immediately proximal to the stented segment, 20% proximal diagonal stenosis, 30% mid LAD stenosis, patent distal LAD stent with 15% intimal hyperplasia; normal left circumflex system; mild 20% proximal and distal stenoses in a dominant RCA.  RECOMMENDATION: Medical therapy.  Recommend echo Doppler study for LV function assessment since despite prolonged attempt at crossing the aortic valve with a catheter due to the significant tortuosity and left ventriculography was not able to be performed during this procedure.  Echocardiogram: 09/03/2018 IMPRESSIONS   1. The left ventricle has normal systolic function of 67-34%. The cavity size was normal. There is no increased left ventricular wall thickness. Echo evidence of  impaired diastolic relaxation Normal left ventricular filling pressures.  2. The right ventricle has normal systolic function. The cavity was normal. There is no increase in right ventricular wall thickness.  3. Left atrial size was mildly dilated.  4. The mitral valve is normal in structure. No evidence of  mitral valve stenosis.  5. The tricuspid valve is normal in structure.  6. The aortic valve is tricuspid.  7. The aortic root is normal in size and structure.  Assessment:    1. Coronary artery disease involving native coronary artery of native heart without angina pectoris   2. Essential hypertension   3. Hyperlipidemia LDL goal <70   4. Bilateral pleural effusion   5. Acute deep vein thrombosis (DVT) of distal end of right lower extremity (Jerry Burns)      Plan:   In order of problems listed above:  1. CAD - s/p DES to proximal LAD and balloon angioplasty to distal LAD in 2016 with patent stent by repeat cath in 01/2017 as outlined above. Recent echocardiogram showed a preserved EF of 55 to 60% with no regional wall motion abnormalities.  - He was experiencing dyspnea at the time of his recent PE but reports symptoms continue to improve.  Denies any recent chest pain.  - Continue beta-blocker and statin therapy. No longer on ASA given the need for anticoagulation at this time.  2. HTN - BP is soft at 106/58 during today's visit. He reports associated dizziness at home but has been unable to check his blood pressure there. He is currently on Amlodipine 10 mg daily, Coreg 12.5 mg twice daily, and Quinapril 10 mg daily. Given his soft readings and also recent hyperkalemia, will reduce Quinapril to 5 mg daily. He does plan to purchase a new BP cuff and will follow readings at home.   3. HLD - Followed by PCP. FLP in 03/2018 showed total cholesterol 129, triglycerides 102, HDL 46, and LDL 63.  He remains on Atorvastatin 80 mg daily.  4. Bilateral Pleural Effusions - Being followed by Pulmonology. His effusions are thought to be secondary to his recent PE. He remains on Torsemide 20 mg daily and reports good urination with this. Weight has declined by approximately 15 pounds over the past few weeks. Was encouraged to continue to follow daily weights. Reviewed sodium and fluid restriction.  5.  Acute DVT with Presumed PE - diagnosed during recent admission and unable to have CTA due to respiratory status. No evidence of right heart strain by echo at that time. Remains on Eliquis for anticoagulation and he denies any evidence of active bleeding.    Medication Adjustments/Labs and Tests Ordered: Current medicines are reviewed at length with the patient today.  Concerns regarding medicines are outlined above.  Medication changes, Labs and Tests ordered today are listed in the Patient Instructions below. Patient Instructions  Medication Instructions:  Your physician has recommended you make the following change in your medication:  Decrease Quinapril to 5 mg Daily   If you need a refill on your cardiac medications before your next appointment, please call your pharmacy.   Lab work: NONE  If you have labs (blood work) drawn today and your tests are completely normal, you will receive your results only by: Marland Kitchen MyChart Message (if you have MyChart) OR . A paper copy in the mail If you have any lab test that is abnormal or we need to change your treatment, we will call you to review the results.  Testing/Procedures: NONE  Follow-Up: At [facility], you and your health needs are our priority.  As part of our continuing mission to provide you with exceptional heart care, we have created designated Provider Care Teams.  These Care Teams include your primary Cardiologist (physician) and Advanced Practice Providers (APPs -  Physician Assistants and Nurse Practitioners) who all work together to provide you with the care you need, when you need it. You will need a follow up appointment in 6 months.  Please call our office 2 months in advance to schedule this appointment.  You may see Kate Sable, MD or one of the following Advanced Practice Providers on your designated Care Team:   Bernerd Pho, PA-C [facility]) . Ermalinda Barrios, PA-C ([place])  Any Other Special  Instructions Will Be Listed Below (If Applicable). Thank you for choosing [facility]!      Signed, Erma Heritage, PA-C  09/22/2018 4:51 PM    [facility] [address] [place], [place] [address] Phone: [phone] Fax: [phone]

## 2018-09-22 NOTE — Patient Instructions (Signed)
Medication Instructions:  Your physician has recommended you make the following change in your medication:  Decrease Quinapril to 5 mg Daily   If you need a refill on your cardiac medications before your next appointment, please call your pharmacy.   Lab work: NONE  If you have labs (blood work) drawn today and your tests are completely normal, you will receive your results only by: Marland Kitchen MyChart Message (if you have MyChart) OR . A paper copy in the mail If you have any lab test that is abnormal or we need to change your treatment, we will call you to review the results.  Testing/Procedures: NONE   Follow-Up: At [facility], you and your health needs are our priority.  As part of our continuing mission to provide you with exceptional heart care, we have created designated Provider Care Teams.  These Care Teams include your primary Cardiologist (physician) and Advanced Practice Providers (APPs -  Physician Assistants and Nurse Practitioners) who all work together to provide you with the care you need, when you need it. You will need a follow up appointment in 6 months.  Please call our office 2 months in advance to schedule this appointment.  You may see Kate Sable, MD or one of the following Advanced Practice Providers on your designated Care Team:   Bernerd Pho, PA-C [facility]) . Ermalinda Barrios, PA-C ([place])  Any Other Special Instructions Will Be Listed Below (If Applicable). Thank you for choosing [facility]!

## 2018-09-26 ENCOUNTER — Ambulatory Visit: Payer: Medicare Other | Admitting: Family Medicine

## 2018-09-28 ENCOUNTER — Other Ambulatory Visit: Payer: Self-pay

## 2018-09-28 ENCOUNTER — Inpatient Hospital Stay (HOSPITAL_COMMUNITY): Payer: Medicare Other | Attending: Hematology | Admitting: Hematology

## 2018-09-28 ENCOUNTER — Encounter (HOSPITAL_COMMUNITY): Payer: Self-pay | Admitting: Hematology

## 2018-09-28 VITALS — BP 128/77 | HR 64 | Temp 97.6°F | Resp 18 | Wt 200.3 lb

## 2018-09-28 DIAGNOSIS — I82411 Acute embolism and thrombosis of right femoral vein: Secondary | ICD-10-CM | POA: Diagnosis not present

## 2018-09-28 DIAGNOSIS — I82409 Acute embolism and thrombosis of unspecified deep veins of unspecified lower extremity: Secondary | ICD-10-CM | POA: Insufficient documentation

## 2018-09-28 DIAGNOSIS — Z7901 Long term (current) use of anticoagulants: Secondary | ICD-10-CM

## 2018-09-28 DIAGNOSIS — D649 Anemia, unspecified: Secondary | ICD-10-CM

## 2018-09-28 DIAGNOSIS — D509 Iron deficiency anemia, unspecified: Secondary | ICD-10-CM | POA: Insufficient documentation

## 2018-09-28 DIAGNOSIS — Z87891 Personal history of nicotine dependence: Secondary | ICD-10-CM | POA: Diagnosis not present

## 2018-09-28 NOTE — Progress Notes (Signed)
[place] [place], [place] [address]   CLINIC:  Medical Oncology/Hematology  PCP:  Kathyrn Drown, MD [address] [place] [address] [phone]   REASON FOR VISIT: Follow-up for normocytic anemia  CURRENT THERAPY: intermittent feraheme infusion    INTERVAL HISTORY:  Jerry Burns 85 y.o. male returns for routine follow-up normocytic anemia. He is feeling very fatigued throughout the day. He is weak and not eating as well as normal. He has a cold and has been coughing with SOB. Denies any nausea, vomiting, or diarrhea. Denies any new pains. Had not noticed any recent bleeding such as epistaxis, hematuria or hematochezia. Denies recent chest pain on exertion, shortness of breath on minimal exertion, pre-syncopal episodes, or palpitations. Denies any numbness or tingling in hands or feet. Denies any recent fevers, infections, or recent hospitalizations. Patient reports appetite at 50% and energy level at 25%. He is maintaining his weight at this time.    REVIEW OF SYSTEMS:  Review of Systems  Constitutional: Positive for fatigue.  Respiratory: Positive for cough and shortness of breath.   Neurological: Positive for dizziness.  Psychiatric/Behavioral: Positive for sleep disturbance.  All other systems reviewed and are negative.    PAST MEDICAL/SURGICAL HISTORY:  Past Medical History:  Diagnosis Date  . Anginal pain ([place])   . Arthritis    Bilateral knee  . Coronary artery disease   . Diabetes mellitus    insulin dependent   . DVT, lower extremity, distal, acute ([place]) 09/09/2018  . Hyperlipidemia   . Hypertension   . Progressive angina ([place]) 03/2015   a. s/p DES to proximal LAD and balloon angioplasty to distal LAD in 2016 b. patent stent by repeat cath in 01/2017 with mild residual CAD)  . Stroke [facility])    Past Surgical History:  Procedure Laterality Date  . APPENDECTOMY    . CARDIAC CATHETERIZATION N/A 04/12/2015   Procedure:  Left Heart Cath and Coronary Angiography;  Surgeon: Sherren Mocha, MD; pLAD 75>0% w/ 3.0x12 mm Resolute DES, dLAD 80>0% w/ Angiosculpt scoring balloon, CFX luminal irreg, RCA mild dz, EF 55-65%   . COLONOSCOPY  2011   RMR: 1. Normal rectum 2. Left sided diverticula , 2 ascending colon polyps, status post snare polypectomy. Remainder of colonic mucosa appeared unremarkable.   . COLONOSCOPY N/A 05/13/2016   Procedure: COLONOSCOPY;  Surgeon: Daneil Dolin, MD;  Location[facility];  Service: Endoscopy;  Laterality: N/A;  7:30 AM  . COLONOSCOPY N/A 05/18/2018   Procedure: COLONOSCOPY;  Surgeon: Daneil Dolin, MD;  Location[facility];  Service: Endoscopy;  Laterality: N/A;  1:15pm  . CORONARY ANGIOGRAPHY N/A 02/04/2017   Procedure: CORONARY ANGIOGRAPHY (CATH LAB);  Surgeon: Troy Sine, MD;  Location: [facility] CV LAB;  Service: Cardiovascular;  Laterality: N/A;  . CORONARY ANGIOPLASTY WITH STENT PLACEMENT  08/22/2014   PTCA/DES X mLAD with 2.5 x 16 mm Promus Premier DES by Dr Julianne Handler  . ESOPHAGOGASTRODUODENOSCOPY N/A 05/18/2018   Procedure: ESOPHAGOGASTRODUODENOSCOPY (EGD);  Surgeon: Daneil Dolin, MD;  [facility];  Service: Endoscopy;  Laterality: N/A;  . FOOT SURGERY Right   . LEFT HEART CATHETERIZATION WITH CORONARY ANGIOGRAM N/A 08/22/2014   Procedure: LEFT HEART CATHETERIZATION WITH CORONARY ANGIOGRAM;  Surgeon: Burnell Blanks, MD;  Location: [facility] CATH LAB;  Service: Cardiovascular;  Laterality: N/A;  . PERCUTANEOUS CORONARY STENT INTERVENTION (PCI-S)  08/22/2014   Procedure: PERCUTANEOUS CORONARY STENT INTERVENTION (PCI-S);  Surgeon: Harrell Gave  Santina Evans, MD;   . SHOULDER SURGERY    . TONSILLECTOMY       SOCIAL HISTORY:  Social History   Socioeconomic History  . Marital status: Married    Spouse name: Not on file  . Number of children: Not on file  . Years of education: 65  . Highest education level: Not on file  Occupational History  .  Occupation: retired  Scientific laboratory technician  . Financial resource strain: Not on file  . Food insecurity:    Worry: Not on file    Inability: Not on file  . Transportation needs:    Medical: Not on file    Non-medical: Not on file  Tobacco Use  . Smoking status: Former Smoker    Packs/day: 1.00    Years: 20.00    Pack years: 20.00    Types: Cigarettes, Cigars    Start date: 08/10/1955    Last attempt to quit: 07/28/2007    Years since quitting: 11.1  . Smokeless tobacco: Never Used  Substance and Sexual Activity  . Alcohol use: Not Currently    Alcohol/week: 0.0 standard drinks    Comment: Daily about 1 glass of wine  . Drug use: No  . Sexual activity: Not Currently  Lifestyle  . Physical activity:    Days per week: Not on file    Minutes per session: Not on file  . Stress: Not on file  Relationships  . Social connections:    Talks on phone: Not on file    Gets together: Not on file    Attends religious service: Not on file    Active member of club or organization: Not on file    Attends meetings of clubs or organizations: Not on file    Relationship status: Not on file  . Intimate partner violence:    Fear of current or ex partner: Not on file    Emotionally abused: Not on file    Physically abused: Not on file    Forced sexual activity: Not on file  Other Topics Concern  . Not on file  Social History Narrative  . Not on file    FAMILY HISTORY:  Family History  Problem Relation Age of Onset  . Diabetes Other   . Diabetes Brother   . Colon cancer Maternal Uncle   . Skin cancer Mother   . Diabetes Father   . Hypertension Sister   . Skin cancer Sister     CURRENT MEDICATIONS:  Outpatient Encounter Medications as of 09/28/2018  Medication Sig Note  . amLODipine (NORVASC) 10 MG tablet TAKE (1) TABLET BY MOUTH ONCE DAILY. (Patient taking differently: Take 10 mg by mouth daily. )   . apixaban (ELIQUIS) 5 MG TABS tablet Take one tablet by mouth twice daily   . atorvastatin  (LIPITOR) 80 MG tablet Take 1 tablet (80 mg total) by mouth daily.   . carvedilol (COREG) 12.5 MG tablet TAKE (1) TABLET BY MOUTH TWICE DAILY.   Marland Kitchen COMBIGAN 0.2-0.5 % ophthalmic solution Place 1 drop into the left eye 2 (two) times daily.    Marland Kitchen dextromethorphan (DELSYM) 30 MG/5ML liquid Take 5 mLs (30 mg total) by mouth 2 (two) times daily as needed for cough.   . dutasteride (AVODART) 0.5 MG capsule TAKE ONE CAPSULE BY MOUTH ONCE DAILY.   Marland Kitchen ELIQUIS DVT/PE STARTER PACK (ELIQUIS STARTER PACK) 5 MG TABS Take as directed on package: start with two-5mg  tablets twice daily for 7 days. On day 8,  switch to one-5mg  tablet twice daily. 09/08/2018: Patient is on starter pack; currently on 10mg  dosing  . Emollient (CERAVE) CREA Apply 1 application topically daily as needed (dry skin).   . Flaxseed, Linseed, (FLAXSEED OIL) 1200 MG CAPS Take 1,200 mg by mouth daily.   . Insulin Glargine (LANTUS SOLOSTAR) 100 UNIT/ML Solostar Pen INJECT 22 UNITS SUBCUTANEOUSLY ONCE DAILY AT  10  PM (Patient taking differently: Inject 17-25 Units into the skin at bedtime. ) 09/08/2018: Patient gave 17 units on 09/07/18 at 2030  . metFORMIN (GLUCOPHAGE) 1000 MG tablet TAKE (1) TABLET BY MOUTH TWICE A DAY WITH A MEAL.   . Multiple Vitamin (MULTIVITAMIN) tablet Take 1 tablet by mouth daily.   . nitroGLYCERIN (NITROSTAT) 0.4 MG SL tablet PLACE 1 TAB UNDER TONGUE EVERY 5 MIN IF NEEDED FOR CHEST PAIN. MAY USE 3 TIMES.NO RELIEF CALL 911.   . pantoprazole (PROTONIX) 40 MG tablet Take 40 mg by mouth. 45 mins   before supper   . Polyvinyl Alcohol-Povidone (REFRESH OP) Place 1 drop into the left eye 2 (two) times daily.   . psyllium (METAMUCIL) 58.6 % powder Take 1 packet by mouth daily.    . quinapril (ACCUPRIL) 5 MG tablet Take 1 tablet (5 mg total) by mouth at bedtime.   . sitaGLIPtin (JANUVIA) 100 MG tablet Take 1 tablet (100 mg total) by mouth daily.   . tamsulosin (FLOMAX) 0.4 MG CAPS capsule Take 1 capsule (0.4 mg total) by mouth daily.     Marland Kitchen torsemide (DEMADEX) 20 MG tablet Take 1 tablet (20 mg total) by mouth every morning.   . Turmeric 500 MG CAPS Take 500 mg by mouth daily.    No facility-administered encounter medications on file as of 09/28/2018.     ALLERGIES:  Allergies  Allergen Reactions  . Adhesive [Tape]     bandaids cause blisters after leaving on for 24 hours   . Sulfa Antibiotics Other (See Comments)    "was terrible" "eye went crazy"  . Latex Other (See Comments)    blisters     PHYSICAL EXAM:  ECOG Performance status: 1  VITAL SIGNS:BP: 128/77, P:64, R:18, T:97.6, O2:95% WEIGHT:200.3   Physical Exam Constitutional:      Appearance: Normal appearance. He is normal weight.  Cardiovascular:     Rate and Rhythm: Normal rate and regular rhythm.     Heart sounds: Normal heart sounds.  Pulmonary:     Effort: Pulmonary effort is normal.     Breath sounds: Normal breath sounds.  Musculoskeletal: Normal range of motion.  Skin:    General: Skin is warm and dry.  Neurological:     Mental Status: He is alert and oriented to person, place, and time. Mental status is at baseline.  Psychiatric:        Mood and Affect: Mood normal.        Behavior: Behavior normal.        Thought Content: Thought content normal.        Judgment: Judgment normal.      LABORATORY DATA:  I have reviewed the labs as listed.  CBC    Component Value Date/Time   WBC 6.7 09/09/2018 1531   RBC 3.85 (L) 09/09/2018 1531   RBC 3.85 (L) 09/09/2018 1531   HGB 10.3 (L) 09/09/2018 1531   HGB 10.4 (L) 05/10/2018 0937   HCT 32.8 (L) 09/09/2018 1531   HCT 32.4 (L) 05/10/2018 0937   PLT 261 09/09/2018 1531   PLT 261 05/10/2018  0937   MCV 85.2 09/09/2018 1531   MCV 87 05/10/2018 0937   MCH 26.8 09/09/2018 1531   MCHC 31.4 09/09/2018 1531   RDW 13.6 09/09/2018 1531   RDW 13.5 05/10/2018 0937   LYMPHSABS 1.0 09/09/2018 1531   LYMPHSABS 1.0 05/10/2018 0937   MONOABS 0.8 09/09/2018 1531   EOSABS 0.5 09/09/2018 1531    EOSABS 0.4 05/10/2018 0937   BASOSABS 0.1 09/09/2018 1531   BASOSABS 0.1 05/10/2018 0937   CMP Latest Ref Rng & Units 09/09/2018 09/08/2018 09/04/2018  Glucose 70 - 99 mg/dL 153(H) 126(H) 123(H)  BUN 8 - 23 mg/dL 25(H) 24(H) 30(H)  Creatinine 0.61 - 1.24 mg/dL 1.06 0.98 1.27(H)  Sodium 135 - 145 mmol/L 130(L) 130(L) 132(L)  Potassium 3.5 - 5.1 mmol/L 4.8 5.3(H) 3.6  Chloride 98 - 111 mmol/L 96(L) 96(L) 97(L)  CO2 22 - 32 mmol/L 25 26 26   Calcium 8.9 - 10.3 mg/dL 9.3 9.4 8.7(L)  Total Protein 6.5 - 8.1 g/dL 7.5 - -  Total Bilirubin 0.3 - 1.2 mg/dL 0.6 - -  Alkaline Phos 38 - 126 U/L 91 - -  AST 15 - 41 U/L 21 - -  ALT 0 - 44 U/L 19 - -       DIAGNOSTIC IMAGING:  I have independently reviewed the scans and discussed with the patient.   I have reviewed Francene Finders, NP's note and agree with the documentation.  I personally performed a face-to-face visit, made revisions and my assessment and plan is as follows.    ASSESSMENT & PLAN:   Normochromic normocytic anemia 1.  Normocytic anemia: - Had a hemoglobin of 9.7 in September 2019 followed by GI work-up. - EGD on 05/18/2018 showed erosive reflex esophagitis, erosive gastropathy.  Biopsy was consistent with chronic active gastritis with H. pylori.  It was thought that he could be bleeding from upper GI. -Colonoscopy on 05/18/2018 shows reticulosis of the sigmoid colon, descending colon and transverse colon.  Tubular adenoma from hepatic flexure was resected. - Admitted to the hospital on 09/02/2017, diagnosed with possible nonocclusive thrombus of indeterminate acuity within 1 of the paired posterior tibial veins in the right calf.  He was started on Eliquis.  He was also found to have a hemoglobin of 7.7.  Ferritin was 75 and percent saturation was 9.  Denies any history of blood transfusion.  Denies any B symptoms in the last 6 months. -Repeat CBC shows hemoglobin of 10.3.  Stool for occult blood x3 was negative.  Direct Coombs test  and LDH were normal. - Percent saturation was low at 9.  F74 and folic acid were within normal limits.  He complains of tiredness.  I have recommended giving a trial of parenteral iron weekly x2 in the form of Feraheme.  We discussed the side effects including rare chance of anaphylactic reactions. -I plan to see him back in 8 weeks with repeat CBC, ferritin and iron panel.  2.  Distal DVT: - Ultrasound on 09/03/2018 showed possible nonocclusive thrombus of indeterminate acuity within 1 of the.  The posterior tibial veins in the right calf. -Currently on Eliquis 5 mg twice daily and tolerating well.  A CT scan of the chest PE protocol could not be done as he is unable to lie flat for the last 7 days. -He reports that he is able to lie flat and take deep breaths.      Orders placed this encounter:  Orders Placed This Encounter  Procedures  . CBC  with Differential/Platelet  . Comprehensive metabolic panel  . Ferritin  . Iron and TIBC  . Vitamin B12  . Folate  . VITAMIN D 25 Hydroxy (Vit-D Deficiency, Fractures)      Derek Jack, MD [place] [phone]

## 2018-09-28 NOTE — Assessment & Plan Note (Addendum)
1.  Normocytic anemia: - Had a hemoglobin of 9.7 in September 2019 followed by GI work-up. - EGD on 05/18/2018 showed erosive reflex esophagitis, erosive gastropathy.  Biopsy was consistent with chronic active gastritis with H. pylori.  It was thought that he could be bleeding from upper GI. -Colonoscopy on 05/18/2018 shows diverticulosis of the sigmoid colon, descending colon and transverse colon.  Tubular adenoma from hepatic flexure was resected. - Admitted to the hospital on 09/02/2017, diagnosed with possible nonocclusive thrombus of indeterminate acuity within 1 of the paired posterior tibial veins in the right calf.  He was started on Eliquis.  He was also found to have a hemoglobin of 7.7.  Ferritin was 75 and percent saturation was 9.  Denies any history of blood transfusion.  Denies any B symptoms in the last 6 months. -Repeat CBC shows hemoglobin of 10.3.  Stool for occult blood x3 was negative.  Direct Coombs test and LDH were normal. - Percent saturation was low at 9.  U93 and folic acid were within normal limits.  He complains of tiredness.  I have recommended giving a trial of parenteral iron weekly x2 in the form of Feraheme.  We discussed the side effects including rare chance of anaphylactic reactions. -I plan to see him back in 8 weeks with repeat CBC, ferritin and iron panel.  2.  Distal DVT: - Ultrasound on 09/03/2018 showed possible nonocclusive thrombus of indeterminate acuity within 1 of the.  The posterior tibial veins in the right calf. -Currently on Eliquis 5 mg twice daily and tolerating well.  A CT scan of the chest PE protocol could not be done as he is unable to lie flat for the last 7 days. -He reports that he is able to lie flat and take deep breaths.

## 2018-09-28 NOTE — Patient Instructions (Signed)
[place] at [facility] Discharge Instructions  You were seen today by Dr. Delton Coombes, he went over how you are currently feeling and how your breathing has been. He went over your recent lab results and things were negative/normal. Your iron was a little low and he would like for you to have iron infusions to see if that helps your iron levels. He went over the iron infusions and what side effects you may have and the chances of you having those. We will schedule you for this and follow up with you in 2 months for labs and follow up.    Thank you for choosing [place] at [facility] to provide your oncology and hematology care.  To afford each patient quality time with our provider, please arrive at least 15 minutes before your scheduled appointment time.   If you have a lab appointment with the [facility] please come in thru the  Main Entrance and check in at the main information desk  You need to re-schedule your appointment should you arrive 10 or more minutes late.  We strive to give you quality time with our providers, and arriving late affects you and other patients whose appointments are after yours.  Also, if you no show three or more times for appointments you may be dismissed from the clinic at the providers discretion.     Again, thank you for choosing [facility].  Our hope is that these requests will decrease the amount of time that you wait before being seen by our physicians.       _____________________________________________________________  Should you have questions after your visit to [facility], please contact our office at [phone] between the hours of 8:00 a.m. and 4:30 p.m.  Voicemails left after 4:00 p.m. will not be returned until the following business day.  For prescription refill requests, have your pharmacy contact our office and allow 72 hours.    Cancer Center Support Programs:   >  Cancer Support Group  2nd Tuesday of the month 1pm-2pm, Journey Room

## 2018-10-03 ENCOUNTER — Other Ambulatory Visit: Payer: Self-pay

## 2018-10-03 ENCOUNTER — Encounter (HOSPITAL_COMMUNITY): Payer: Self-pay

## 2018-10-03 ENCOUNTER — Other Ambulatory Visit: Payer: Self-pay | Admitting: Family Medicine

## 2018-10-03 ENCOUNTER — Inpatient Hospital Stay (HOSPITAL_COMMUNITY): Payer: Medicare Other

## 2018-10-03 VITALS — BP 113/63 | HR 69 | Temp 97.9°F | Resp 18

## 2018-10-03 DIAGNOSIS — I82409 Acute embolism and thrombosis of unspecified deep veins of unspecified lower extremity: Secondary | ICD-10-CM | POA: Diagnosis not present

## 2018-10-03 DIAGNOSIS — Z7901 Long term (current) use of anticoagulants: Secondary | ICD-10-CM | POA: Diagnosis not present

## 2018-10-03 DIAGNOSIS — D649 Anemia, unspecified: Secondary | ICD-10-CM

## 2018-10-03 DIAGNOSIS — D509 Iron deficiency anemia, unspecified: Secondary | ICD-10-CM | POA: Diagnosis not present

## 2018-10-03 MED ORDER — SODIUM CHLORIDE 0.9 % IV SOLN
Freq: Once | INTRAVENOUS | Status: AC
  Administered 2018-10-03: 08:00:00 via INTRAVENOUS

## 2018-10-03 MED ORDER — SODIUM CHLORIDE 0.9 % IV SOLN
510.0000 mg | Freq: Once | INTRAVENOUS | Status: AC
  Administered 2018-10-03: 510 mg via INTRAVENOUS
  Filled 2018-10-03: qty 17

## 2018-10-03 NOTE — Patient Instructions (Signed)
[facility] at [facility]  Discharge Instructions:   _______________________________________________________________  Thank you for choosing [facility] at [facility] to provide your oncology and hematology care.  To afford each patient quality time with our providers, please arrive at least 15 minutes before your scheduled appointment.  You need to re-schedule your appointment if you arrive 10 or more minutes late.  We strive to give you quality time with our providers, and arriving late affects you and other patients whose appointments are after yours.  Also, if you no show three or more times for appointments you may be dismissed from the clinic.  Again, thank you for choosing [facility] at [facility]. Our hope is that these requests will allow you access to exceptional care and in a timely manner. _______________________________________________________________  If you have questions after your visit, please contact our office at [phone] between the hours of 8:30 a.m. and 5:00 p.m. Voicemails left after 4:30 p.m. will not be returned until the following business day. _______________________________________________________________  For prescription refill requests, have your pharmacy contact our office. _______________________________________________________________  Recommendations made by the consultant and any test results will be sent to your referring physician. _______________________________________________________________ 

## 2018-10-03 NOTE — Progress Notes (Signed)
Feraheme given today per MD orders. Tolerated infusion without adverse affects. Vital signs stable. No complaints at this time. Discharged from clinic ambulatory. F/U with [facility] as scheduled.  

## 2018-10-06 DIAGNOSIS — D649 Anemia, unspecified: Secondary | ICD-10-CM | POA: Diagnosis not present

## 2018-10-06 DIAGNOSIS — R5383 Other fatigue: Secondary | ICD-10-CM | POA: Diagnosis not present

## 2018-10-06 DIAGNOSIS — R0609 Other forms of dyspnea: Secondary | ICD-10-CM | POA: Diagnosis not present

## 2018-10-07 LAB — CBC WITH DIFFERENTIAL/PLATELET
Basophils Absolute: 0.1 10*3/uL (ref 0.0–0.2)
Basos: 1 %
EOS (ABSOLUTE): 0.5 10*3/uL — ABNORMAL HIGH (ref 0.0–0.4)
Eos: 7 %
Hematocrit: 29.8 % — ABNORMAL LOW (ref 37.5–51.0)
Hemoglobin: 9.9 g/dL — ABNORMAL LOW (ref 13.0–17.7)
Immature Grans (Abs): 0 10*3/uL (ref 0.0–0.1)
Immature Granulocytes: 0 %
Lymphocytes Absolute: 1.1 10*3/uL (ref 0.7–3.1)
Lymphs: 16 %
MCH: 26.9 pg (ref 26.6–33.0)
MCHC: 33.2 g/dL (ref 31.5–35.7)
MCV: 81 fL (ref 79–97)
MONOCYTES: 16 %
Monocytes Absolute: 1.2 10*3/uL — ABNORMAL HIGH (ref 0.1–0.9)
Neutrophils Absolute: 4.3 10*3/uL (ref 1.4–7.0)
Neutrophils: 60 %
Platelets: 260 10*3/uL (ref 150–450)
RBC: 3.68 x10E6/uL — ABNORMAL LOW (ref 4.14–5.80)
RDW: 13.9 % (ref 11.6–15.4)
WBC: 7.1 10*3/uL (ref 3.4–10.8)

## 2018-10-11 ENCOUNTER — Telehealth: Payer: Self-pay | Admitting: Family Medicine

## 2018-10-11 NOTE — Telephone Encounter (Signed)
I am concerned about the current situation healthwise If this patient feels that things are going well we can delay his appointment for 1 month If he would like to keep appointment he may do so

## 2018-10-12 ENCOUNTER — Inpatient Hospital Stay (HOSPITAL_COMMUNITY): Payer: Medicare Other

## 2018-10-12 ENCOUNTER — Encounter (HOSPITAL_COMMUNITY): Payer: Self-pay

## 2018-10-12 ENCOUNTER — Other Ambulatory Visit: Payer: Self-pay

## 2018-10-12 VITALS — BP 112/58 | HR 58 | Temp 97.9°F | Resp 18

## 2018-10-12 DIAGNOSIS — Z7901 Long term (current) use of anticoagulants: Secondary | ICD-10-CM | POA: Diagnosis not present

## 2018-10-12 DIAGNOSIS — I82409 Acute embolism and thrombosis of unspecified deep veins of unspecified lower extremity: Secondary | ICD-10-CM | POA: Diagnosis not present

## 2018-10-12 DIAGNOSIS — D509 Iron deficiency anemia, unspecified: Secondary | ICD-10-CM | POA: Diagnosis not present

## 2018-10-12 DIAGNOSIS — D649 Anemia, unspecified: Secondary | ICD-10-CM

## 2018-10-12 MED ORDER — SODIUM CHLORIDE 0.9 % IV SOLN
Freq: Once | INTRAVENOUS | Status: AC
  Administered 2018-10-12: 09:00:00 via INTRAVENOUS

## 2018-10-12 MED ORDER — SODIUM CHLORIDE 0.9 % IV SOLN
510.0000 mg | Freq: Once | INTRAVENOUS | Status: AC
  Administered 2018-10-12: 510 mg via INTRAVENOUS
  Filled 2018-10-12: qty 17

## 2018-10-12 NOTE — Progress Notes (Signed)
Iron given per orders. Patient tolerated it well without problems. Vitals stable and discharged home from clinic ambulatory. Follow up as scheduled.  

## 2018-10-13 ENCOUNTER — Ambulatory Visit: Payer: Medicare Other | Admitting: Family Medicine

## 2018-10-24 ENCOUNTER — Ambulatory Visit (INDEPENDENT_AMBULATORY_CARE_PROVIDER_SITE_OTHER): Payer: Medicare Other | Admitting: Internal Medicine

## 2018-10-24 ENCOUNTER — Other Ambulatory Visit: Payer: Self-pay | Admitting: Internal Medicine

## 2018-10-24 ENCOUNTER — Telehealth: Payer: Self-pay | Admitting: Internal Medicine

## 2018-10-24 ENCOUNTER — Other Ambulatory Visit: Payer: Self-pay | Admitting: Family Medicine

## 2018-10-24 ENCOUNTER — Other Ambulatory Visit: Payer: Self-pay

## 2018-10-24 DIAGNOSIS — I10 Essential (primary) hypertension: Secondary | ICD-10-CM

## 2018-10-24 DIAGNOSIS — R053 Chronic cough: Secondary | ICD-10-CM

## 2018-10-24 DIAGNOSIS — R05 Cough: Secondary | ICD-10-CM | POA: Diagnosis not present

## 2018-10-24 MED ORDER — GUAIFENESIN-CODEINE 100-10 MG/5ML PO SOLN: 10.0000 mL | ORAL | 0 refills | Status: DC | PRN

## 2018-10-24 NOTE — Telephone Encounter (Signed)
Needs televisit either me or NP

## 2018-10-24 NOTE — Progress Notes (Signed)
Jerry Burns, male    DOB: 1933/09/06      MRN: [id number]   Brief patient profile:  61 yowm quit smoking 2009 s apparent sequelae but middle July 2019 acute weakness /sob and eval by cards and pcp with dx of anemia> gi w/u 05/18/18  diverticulosis, sessile colon polys, severe esophagitis ? NSAID effects> not really better despite improved  around 1st Feb new onset orthopnea with mild cough and L > R swelling >  [facility] admitted 09/02/2018:   Admit date: 09/02/2018 Discharge date: 09/05/2018  Admitted From: Home  Disposition: Home   Recommendations for Outpatient Follow-up:  1. Follow up with PCP in 1 weeks 2. Follow up with cardiology in 1-2 weeks 3. Anticoagulate for 3-6 months for PE/DVT 4. Please obtain BMP/CBC in 1 week to follow up.  5. Monitor for bleeding complications.   Discharge Condition: STABLE   CODE STATUS: FULL    Brief Hospitalization Summary: Please see all hospital notes, images, labs for full details of the hospitalization. Dr. Toney Burns HPI: Jerry Burns a84 y.o.male,history of hypertension, diabetes mellitus type 2, hyperlipidemia, coronary artery disease status post stent to theproximal LAD,Came to hospital with worsening shortness of breath.Patient says that for the past 2 weeks shortness of breath has gotten worse. And he is too dyspneic to lie flat and had to sit up all night.  He admits to coughing up clear phlegm. No fever but has been feeling cold. Denies nausea vomiting or diarrhea. Denies chest pain.  He went to his PCP today where fingerstick hemoglobin showed hemoglobin of 7.7 whereas CBC showed hemoglobin around 11.  In the ED patient was given Lasix 40 mg IV with excellent diuretic response.  He was found to have bilateral lower extremity swelling left more than right, concerning for venous thromboembolism. CTA chest ordered but could not be done as patient unable to lie flat. IV heparin started in the ED empirically for presumed PE.        History of Present Illness  09/13/2018  Pulmonary/ 1st office eval/Jerry Burns  Chief Complaint  Patient presents with  . Pulmonary Consult    Referred by Dr. Wolfgang Burns. Pt c/o DOE x 6 wks. He feels that it's improving.   Dyspnea:  MMRC2 = can't walk a nl pace on a flat grade s sob but does fine slow and flat [employer] shopping Cough: almost gone Sleep: still in recliner but down to incline 60 degrees says this is def  improved  SABA use: no better with sba  rec Please remember to go to the  x-ray department  for your tests - we will call you with the results when they are available   Please schedule a follow up office visit in 6 weeks, call sooner if needed with cxr        Virtual Visit via Telephone Note  I connected with Jerry Burns on 10/24/2018  at 11:45 AM EDT by telephone and verified that I am speaking with the correct person using two identifiers.   I discussed the limitations, risks, security and privacy concerns of performing an evaluation and management service by telephone and the availability of in person appointments. I also discussed with the patient that there may be a patient responsible charge related to this service. The patient expressed understanding and agreed to proceed.   History of Present Illness:   States that he has been dealing with increased coughing. Reports cough with production of yellow mucus at times. Denies chest tightness, wheezing,  shortness of breath, fever, sick contacts or recent travel. No increased in leg swelling or self monitor wts  Has been taking Mucinex and Hycodan cough syrup with minimal relief but only using the latter minimally and says cough quiets down once asleep  Symptoms started getting worse over the past week.    Observations/Objective: Talking in full sentences, moderately  hoarse, dry sounding cough.    Assessment and Plan: See a/p    Outpatient Encounter Medications as of 10/24/2018  Medication Sig  . amLODipine  (NORVASC) 10 MG tablet TAKE (1) TABLET BY MOUTH ONCE DAILY. (Patient taking differently: Take 10 mg by mouth daily. )  . apixaban (ELIQUIS) 5 MG TABS tablet Take one tablet by mouth twice daily  . atorvastatin (LIPITOR) 80 MG tablet Take 1 tablet (80 mg total) by mouth daily.  . carvedilol (COREG) 12.5 MG tablet TAKE (1) TABLET BY MOUTH TWICE DAILY.  Marland Kitchen COMBIGAN 0.2-0.5 % ophthalmic solution Place 1 drop into the left eye 2 (two) times daily.   Marland Kitchen dextromethorphan (DELSYM) 30 MG/5ML liquid Take 5 mLs (30 mg total) by mouth 2 (two) times daily as needed for cough.  . dutasteride (AVODART) 0.5 MG capsule TAKE ONE CAPSULE BY MOUTH ONCE DAILY.  Marland Kitchen ELIQUIS DVT/PE STARTER PACK (ELIQUIS STARTER PACK) 5 MG TABS Take as directed on package: start with two-5mg  tablets twice daily for 7 days. On day 8, switch to one-5mg  tablet twice daily.  . Emollient (CERAVE) CREA Apply 1 application topically daily as needed (dry skin).  . Flaxseed, Linseed, (FLAXSEED OIL) 1200 MG CAPS Take 1,200 mg by mouth daily.  Marland Kitchen guaiFENesin-codeine 100-10 MG/5ML syrup Take 10 mLs by mouth every 4 (four) hours as needed for cough.  . Insulin Glargine (LANTUS SOLOSTAR) 100 UNIT/ML Solostar Pen INJECT 22 UNITS SUBCUTANEOUSLY ONCE DAILY AT  10  PM (Patient taking differently: Inject 17-25 Units into the skin at bedtime. )  . JANUVIA 100 MG tablet TAKE (1) TABLET BY MOUTH ONCE DAILY.  . metFORMIN (GLUCOPHAGE) 1000 MG tablet TAKE (1) TABLET BY MOUTH TWICE A DAY WITH A MEAL.  . Multiple Vitamin (MULTIVITAMIN) tablet Take 1 tablet by mouth daily.  . nitroGLYCERIN (NITROSTAT) 0.4 MG SL tablet PLACE 1 TAB UNDER TONGUE EVERY 5 MIN IF NEEDED FOR CHEST PAIN. MAY USE 3 TIMES.NO RELIEF CALL 911.  . Polyvinyl Alcohol-Povidone (REFRESH OP) Place 1 drop into the left eye 2 (two) times daily.  . psyllium (METAMUCIL) 58.6 % powder Take 1 packet by mouth daily.   Marland Kitchen torsemide (DEMADEX) 20 MG tablet Take 1 tablet (20 mg total) by mouth every morning.  .  Turmeric 500 MG CAPS Take 500 mg by mouth daily.  .     .     .     .        Follow Up Instructions: see avs for instructions unique to this ov     I discussed the assessment and treatment plan with the patient. The patient was provided an opportunity to ask questions and all were answered. The patient agreed with the plan and demonstrated an understanding of the instructions.   The patient was advised to call back or seek an in-person evaluation if the symptoms worsen or if the condition fails to improve as anticipated.  I provided 25 minutes of non-face-to-face time during this encounter.   Christinia Gully, MD

## 2018-10-24 NOTE — Patient Instructions (Signed)
Prednisone 10 mg take  4 each am x 2 days,   2 each am x 2 days,  1 each am x 2 days and stop   zpak   For cough robiutussin with codeine 1-2 tsp every 4 hours if neeeded  Stop acupril and start diovan 160 mg (valsartan) in its place and the cough should gradually improve over the next sev weeks    Please schedule a follow up office visit in 4 weeks, sooner if needed with cxr on return

## 2018-10-24 NOTE — Telephone Encounter (Signed)
Spoke with pt. States that he has been dealing with increased coughing. Reports cough with production of yellow mucus at times. Denies chest tightness, wheezing, shortness of breath, fever, sick contacts or recent travel. Has been taking Mucinex and Hycodan cough syrup with minimal relief. Symptoms started getting worse over the past week. Pt would like MW's recommendations.  MW - please advise. Thanks!

## 2018-10-24 NOTE — Telephone Encounter (Signed)
Spoke with pt. He has been scheduled for a televisit with MW at 1145.

## 2018-10-25 ENCOUNTER — Ambulatory Visit: Payer: Medicare Other | Admitting: Internal Medicine

## 2018-10-25 ENCOUNTER — Telehealth: Payer: Self-pay | Admitting: Internal Medicine

## 2018-10-25 MED ORDER — VALSARTAN 160 MG PO TABS: 160.0000 mg | ORAL_TABLET | Freq: Every day | ORAL | 1 refills | Status: DC

## 2018-10-25 MED ORDER — PREDNISONE 10 MG PO TABS: ORAL_TABLET | ORAL | 0 refills | Status: DC

## 2018-10-25 MED ORDER — AZITHROMYCIN 250 MG PO TABS: ORAL_TABLET | ORAL | 0 refills | Status: DC

## 2018-10-25 NOTE — Telephone Encounter (Signed)
Pt had televisit on yesterday. None of the medication were sent to pharmacy.  Dr. Melvyn Novas please review.  Prednisone 10 mg take  4 each am x 2 days,   2 each am x 2 days,  1 each am x 2 days and stop   zpak   For cough robiutussin with codeine 1-2 tsp every 4 hours if neeeded  Stop acupril and start diovan 160 mg (valsartan) in its place and the cough should gradually improve over the next sev weeks    Please schedule a follow up office visit in 4 weeks, sooner if needed with cxr on return

## 2018-10-25 NOTE — Telephone Encounter (Signed)
PT aware rx have been sent.

## 2018-10-25 NOTE — Telephone Encounter (Signed)
It looks like the rob with codeine went thru, and I sent the others just now  Call pharmacy to be sure re codeine

## 2018-10-25 NOTE — Telephone Encounter (Signed)
Verified rx cough syrup received on yesterday Nothing further needed.

## 2018-10-26 ENCOUNTER — Encounter: Payer: Self-pay | Admitting: Internal Medicine

## 2018-10-26 DIAGNOSIS — R05 Cough: Secondary | ICD-10-CM | POA: Insufficient documentation

## 2018-10-26 DIAGNOSIS — R053 Chronic cough: Secondary | ICD-10-CM | POA: Insufficient documentation

## 2018-10-26 NOTE — Assessment & Plan Note (Addendum)
The most common causes of chronic cough in immunocompetent adults include the following: upper airway cough syndrome (UACS), previously referred to as postnasal drip syndrome (PNDS), which is caused by variety of rhinosinus conditions; (2) asthma; (3) GERD; (4) chronic bronchitis from cigarette smoking or other inhaled environmental irritants; (5) nonasthmatic eosinophilic bronchitis; and (6) bronchiectasis.   These conditions, singly or in combination, have accounted for up to 94% of the causes of chronic cough in prospective studies.   Other conditions have constituted no >6% of the causes in prospective studies These have included bronchogenic carcinoma, chronic interstitial pneumonia, sarcoidosis, left ventricular failure, ACEI-induced cough, and aspiration from a condition associated with pharyngeal dysfunction.    Chronic cough is often simultaneously caused by more than one condition. A single cause has been found from 38 to 82% of the time, multiple causes from 18 to 62%. Multiply caused cough has been the result of three diseases up to 42% of the time.    In absence of worse sob or signs of uri this is most likely Upper airway cough syndrome (previously labeled PNDS),  is so named because it's frequently impossible to sort out how much is  CR/sinusitis with freq throat clearing (which can be related to primary GERD)   vs  causing  secondary (" extra esophageal")  GERD from wide swings in gastric pressure that occur with throat clearing, often  promoting self use of mint and menthol lozenges that reduce the lower esophageal sphincter tone and exacerbate the problem further in a cyclical fashion.   These are the same pts (now being labeled as having "irritable larynx syndrome" by some cough centers) who not infrequently have a history of having failed to tolerate ace inhibitors,  dry powder inhalers or biphosphonates or report having atypical/extraesophageal reflux symptoms that don't respond to  standard doses of PPI  and are easily confused as having aecopd or asthma flares by even experienced allergists/ pulmonologists (myself included).    First step is try off acei/ rx empirically with zpak/ pred x 6 days, and regroup in 4 weeks with ov/cxr as prev planned   Until stop coughing add Try prilosec otc 20mg   Take 30-60 min before first meal of the day and Pepcid ac (famotidine) 20 mg one @  Bedtime

## 2018-10-26 NOTE — Assessment & Plan Note (Signed)
ACE inhibitors are problematic in  pts with airway complaints because  even experienced pulmonologists can't always distinguish ace effects from copd/asthma.  By themselves they don't actually cause a problem, much like oxygen can't by itself start a fire, but they certainly serve as a powerful catalyst or enhancer for any "fire"  or inflammatory process in the upper airway, be it caused by an ET  tube or more commonly reflux (especially in the obese or pts with known GERD or who are on biphoshonates).    In the era of ARB near equivalency until we have a better handle on the reversibility of the airway problem, it just makes sense to avoid ACEI  entirely in the short run and then decide later, having established a level of airway control using a reasonable limited regimen, whether to add back ace but even then being very careful to observe the pt for worsening airway control and number of meds used/ needed to control symptoms.     Try diovan 160 mg daily and he will self monitor bp and contact me if not optimal with this short term trial

## 2018-10-27 ENCOUNTER — Telehealth: Payer: Self-pay | Admitting: Internal Medicine

## 2018-10-27 NOTE — Telephone Encounter (Signed)
Patient states that since starting his prednisone his sugars have  His glucose reading was 302 at 4:00 AM - 8:00 AM today was 202. Also having a dull pain in lower right abdomin. The pain comes and go. This has happen twice but it is like a thump.   He would like to know if he should continue prednisone.  He does state breathing is much better though. . MW  Please advise

## 2018-10-27 NOTE — Telephone Encounter (Signed)
Spoke with pt. He is aware of MW's response. Nothing further was needed.

## 2018-10-27 NOTE — Telephone Encounter (Signed)
If doing better on prednisone then continue taper as written and just  adjust lantus upward to cover for the prednisone (looks like he has an adjustable scale and if needs additional recs on how to do this should call the doctor prescribing it)   Assure him this is temporary and almost never requires admit as long as stays hydrated with water (non -glucose containing liquids) .

## 2018-11-03 ENCOUNTER — Other Ambulatory Visit: Payer: Self-pay | Admitting: Family Medicine

## 2018-11-22 ENCOUNTER — Other Ambulatory Visit: Payer: Self-pay | Admitting: Internal Medicine

## 2018-11-28 ENCOUNTER — Telehealth: Payer: Self-pay | Admitting: Family Medicine

## 2018-11-28 DIAGNOSIS — E785 Hyperlipidemia, unspecified: Secondary | ICD-10-CM

## 2018-11-28 DIAGNOSIS — E119 Type 2 diabetes mellitus without complications: Secondary | ICD-10-CM

## 2018-11-28 NOTE — Telephone Encounter (Signed)
Last labs by dr Nicki Reaper 03/29/18 bmp, lipid, microalb. Urine and cbc on 10/06/18 Has upcoming orders for vit d, cmp, cbc. Folate, b12, tibc, iron, ferritin ordered by Dr. Delton Coombes

## 2018-11-28 NOTE — Telephone Encounter (Signed)
Discussed with pt. Pt verbalized understanding. Orders put in for pt to do in june

## 2018-11-28 NOTE — Telephone Encounter (Signed)
Pt is calling to see if Dr. Nicki Reaper would like to order some lab work for him to have done Wednesday at [facility] when he has his hematology appt. He states he hasnt had a diabetes check up since Nov.

## 2018-11-28 NOTE — Telephone Encounter (Signed)
Patient had lab work in February that did do A1c.  By Medicare guidelines it is too early to do the A1c again. I would recommend an A1c along with lipid profile in early June with a follow-up office visit at that point in time you should be seen people in the office

## 2018-11-30 ENCOUNTER — Other Ambulatory Visit: Payer: Self-pay

## 2018-11-30 ENCOUNTER — Inpatient Hospital Stay (HOSPITAL_COMMUNITY): Payer: Medicare Other | Attending: Hematology

## 2018-11-30 DIAGNOSIS — D649 Anemia, unspecified: Secondary | ICD-10-CM | POA: Diagnosis not present

## 2018-11-30 LAB — CBC WITH DIFFERENTIAL/PLATELET
Abs Immature Granulocytes: 0.03 10*3/uL (ref 0.00–0.07)
Basophils Absolute: 0.1 10*3/uL (ref 0.0–0.1)
Basophils Relative: 1 %
Eosinophils Absolute: 0.8 10*3/uL — ABNORMAL HIGH (ref 0.0–0.5)
Eosinophils Relative: 10 %
HCT: 33.9 % — ABNORMAL LOW (ref 39.0–52.0)
Hemoglobin: 10.8 g/dL — ABNORMAL LOW (ref 13.0–17.0)
Immature Granulocytes: 0 %
Lymphocytes Relative: 11 %
Lymphs Abs: 0.9 10*3/uL (ref 0.7–4.0)
MCH: 28.2 pg (ref 26.0–34.0)
MCHC: 31.9 g/dL (ref 30.0–36.0)
MCV: 88.5 fL (ref 80.0–100.0)
Monocytes Absolute: 0.9 10*3/uL (ref 0.1–1.0)
Monocytes Relative: 11 %
Neutro Abs: 5.4 10*3/uL (ref 1.7–7.7)
Neutrophils Relative %: 67 %
Platelets: 262 10*3/uL (ref 150–400)
RBC: 3.83 MIL/uL — ABNORMAL LOW (ref 4.22–5.81)
RDW: 16.3 % — ABNORMAL HIGH (ref 11.5–15.5)
WBC: 8.1 10*3/uL (ref 4.0–10.5)
nRBC: 0 % (ref 0.0–0.2)

## 2018-11-30 LAB — COMPREHENSIVE METABOLIC PANEL
ALT: 16 U/L (ref 0–44)
AST: 20 U/L (ref 15–41)
Albumin: 3.7 g/dL (ref 3.5–5.0)
Alkaline Phosphatase: 91 U/L (ref 38–126)
Anion gap: 11 (ref 5–15)
BUN: 36 mg/dL — ABNORMAL HIGH (ref 8–23)
CO2: 27 mmol/L (ref 22–32)
Calcium: 9.3 mg/dL (ref 8.9–10.3)
Chloride: 100 mmol/L (ref 98–111)
Creatinine, Ser: 1.1 mg/dL (ref 0.61–1.24)
GFR calc Af Amer: >60 mL/min (ref >60)
GFR calc non Af Amer: >60 mL/min (ref >60)
Glucose, Bld: 150 mg/dL — ABNORMAL HIGH (ref 70–99)
Potassium: 5.5 mmol/L — ABNORMAL HIGH (ref 3.5–5.1)
Sodium: 138 mmol/L (ref 135–145)
Total Bilirubin: 0.7 mg/dL (ref 0.3–1.2)
Total Protein: 7.4 g/dL (ref 6.5–8.1)

## 2018-11-30 LAB — FOLATE: Folate: 18.9 ng/mL (ref >5.9)

## 2018-11-30 LAB — IRON AND TIBC
Iron: 47 ug/dL (ref 45–182)
Saturation Ratios: 18 % (ref 17.9–39.5)
TIBC: 259 ug/dL (ref 250–450)
UIBC: 212 ug/dL

## 2018-11-30 LAB — VITAMIN B12: Vitamin B-12: 660 pg/mL (ref 180–914)

## 2018-11-30 LAB — FERRITIN: Ferritin: 241 ng/mL (ref 24–336)

## 2018-12-01 LAB — VITAMIN D 25 HYDROXY (VIT D DEFICIENCY, FRACTURES): Vit D, 25-Hydroxy: 33.2 ng/mL (ref 30.0–100.0)

## 2018-12-07 ENCOUNTER — Other Ambulatory Visit: Payer: Self-pay

## 2018-12-07 ENCOUNTER — Inpatient Hospital Stay (HOSPITAL_BASED_OUTPATIENT_CLINIC_OR_DEPARTMENT_OTHER): Payer: Medicare Other | Admitting: Nurse Practitioner

## 2018-12-07 DIAGNOSIS — D649 Anemia, unspecified: Secondary | ICD-10-CM | POA: Diagnosis not present

## 2018-12-07 NOTE — Progress Notes (Signed)
Virtual Visit via Telephone Note  I connected with Jerry Burns on 12/07/18 at  9:40 AM EDT by telephone and verified that I am speaking with the correct person using two identifiers.   I discussed the limitations, risks, security and privacy concerns of performing an evaluation and management service by telephone and the availability of in person appointments. I also discussed with the patient that there may be a patient responsible charge related to this service. The patient expressed understanding and agreed to proceed.   History of Present Illness: Patient has been doing well since his last visit.  He reports no new hospitalizations or problems with any blood clots.  He reports he is normally short of breath with exertion this is normal for him.  He is feeling better since his 2 iron infusions.  However he is still fatigued at times.  He denies any bright red bleeding or melena. Denies any nausea, vomiting, or diarrhea. Denies any new pains. Had not noticed any recent bleeding such as epistaxis, hematuria or hematochezia. Denies recent chest pain on exertion, shortness of breath on minimal exertion, pre-syncopal episodes, or palpitations. Denies any numbness or tingling in hands or feet. Denies any recent fevers, infections, or recent hospitalizations. Patient reports appetite at 50% and energy level at 75%.  He is eating well and maintaining his weight at this time.    Observations/Objective: - Patient was alert and oriented and in no distress. - Physical assessment deferred due to telephone visit.  Assessment and Plan: 1.  Normocytic anemia: - Thought to be from GI blood loss. -She had a hemoglobin of 9.7 in September 2019 followed by a GI work-up. -He had an EGD on 05/18/2018 showed erosive reflux esophagitis, erosive gastropathy.  Biopsy was consistent with chronic active gastritis with H. pylori.  It was thought that he could have been bleeding from upper GI. -Colonoscopy on  05/18/2018 showed diverticulosis of the sigmoid colon, descending colon and transverse colon.  Tubular adenoma from hepatic flexure was resected. - He was admitted to the hospital on 09/02/2017 and diagnosed with a possible nonocclusive thrombus of the determinate acuity within 1 of the paired posterior tibial veins in the right calf.  He was started on Eliquis.  It was also found that he had a hemoglobin of 7.7.  Ferritin was 75 and percent saturation was 9. -He denies any history of blood transfusions.  Denies any B symptoms. - For his work-up his CBC was repeated hemoglobin showed 10.3.  Stool for occult blood x3 was negative.  Direct Coombs test and LDH were normal.  M75 and folic acid were normal. - His last Feraheme infusion was on 10/03/2018 and 10/12/2018. - Labs on 11/30/2018 showed hemoglobin at 10.8, ferritin 241, and percent saturation at 18. - He will follow-up in 1 months with repeat labs.  2.  Distal DVT: - He had an ultrasound on 09/03/2018 showed possible nonocclusive thrombus of the intermediate acuity within 1 of the posterior tibial veins in the right calf. - He is currently on Eliquis 5 mg twice daily and tolerating well.  That was started in February 2019. - A CT scan of the chest PE protocol could not be done as he is unable to lie flat at that time. -He reports he is now able to lie flat and take deep breaths.  Follow Up Instructions: -He will follow-up in 2 months with repeat labs. -He will continue his Eliquis 5 mg twice daily. -She will call us with any  bleeding or any worsening of his fatigue.   I discussed the assessment and treatment plan with the patient. The patient was provided an opportunity to ask questions and all were answered. The patient agreed with the plan and demonstrated an understanding of the instructions.   The patient was advised to call back or seek an in-person evaluation if the symptoms worsen or if the condition fails to improve as anticipated.  I  provided 20 minutes of non-face-to-face time during this encounter. Patient does not have access to video chat.  Glennie Isle, NP-C

## 2018-12-07 NOTE — Addendum Note (Signed)
Addended by: Glennie Isle on: 12/07/2018 10:10 AM   Modules accepted: Orders

## 2018-12-07 NOTE — Patient Instructions (Signed)
[facility] at [facility] Discharge Instructions     Thank you for choosing [facility] at [facility] to provide your oncology and hematology care.  To afford each patient quality time with our provider, please arrive at least 15 minutes before your scheduled appointment time.   If you have a lab appointment with the Cancer Center please come in thru the  Main Entrance and check in at the main information desk  You need to re-schedule your appointment should you arrive 10 or more minutes late.  We strive to give you quality time with our providers, and arriving late affects you and other patients whose appointments are after yours.  Also, if you no show three or more times for appointments you may be dismissed from the clinic at the providers discretion.     Again, thank you for choosing [facility].  Our hope is that these requests will decrease the amount of time that you wait before being seen by our physicians.       _____________________________________________________________  Should you have questions after your visit to  Cancer Center, please contact our office at [phone] between the hours of 8:00 a.m. and 4:30 p.m.  Voicemails left after 4:00 p.m. will not be returned until the following business day.  For prescription refill requests, have your pharmacy contact our office and allow 72 hours.    Cancer Center Support Programs:   > Cancer Support Group  2nd Tuesday of the month 1pm-2pm, Journey Room    

## 2018-12-14 ENCOUNTER — Ambulatory Visit: Payer: Medicare Other | Admitting: Internal Medicine

## 2018-12-22 ENCOUNTER — Encounter: Payer: Self-pay | Admitting: Internal Medicine

## 2018-12-22 ENCOUNTER — Ambulatory Visit (INDEPENDENT_AMBULATORY_CARE_PROVIDER_SITE_OTHER): Payer: Medicare Other | Admitting: Internal Medicine

## 2018-12-22 ENCOUNTER — Other Ambulatory Visit: Payer: Self-pay

## 2018-12-22 DIAGNOSIS — J90 Pleural effusion, not elsewhere classified: Secondary | ICD-10-CM | POA: Diagnosis not present

## 2018-12-22 DIAGNOSIS — R0609 Other forms of dyspnea: Secondary | ICD-10-CM | POA: Diagnosis not present

## 2018-12-22 DIAGNOSIS — I10 Essential (primary) hypertension: Secondary | ICD-10-CM

## 2018-12-22 DIAGNOSIS — R05 Cough: Secondary | ICD-10-CM | POA: Diagnosis not present

## 2018-12-22 DIAGNOSIS — I824Z9 Acute embolism and thrombosis of unspecified deep veins of unspecified distal lower extremity: Secondary | ICD-10-CM

## 2018-12-22 DIAGNOSIS — R053 Chronic cough: Secondary | ICD-10-CM

## 2018-12-22 NOTE — Progress Notes (Signed)
Jerry Burns, male    DOB: [birth year]      MRN: [id number]   Brief patient profile:  52 yowm quit smoking 2009 s apparent sequelae but middle July 2019 acute weakness /sob and eval by cards and pcp with dx of anemia> gi w/u 05/18/18  diverticulosis, sessile colon polys, severe esophagitis ? NSAID effects> not really better despite improved  around 1st Feb new onset orthopnea with mild cough and L > R swelling >  [facility] admitted 09/02/2018:   Admit date: 09/02/2018 Discharge date: 09/05/2018  Admitted From: Home  Disposition: Home   Recommendations for Outpatient Follow-up:  1. Follow up with PCP in 1 weeks 2. Follow up with cardiology in 1-2 weeks 3. Anticoagulate for 3-6 months for PE/DVT 4. Please obtain BMP/CBC in 1 week to follow up.  5. Monitor for bleeding complications.   Discharge Condition: STABLE   CODE STATUS: FULL    Brief Hospitalization Summary: Please see all hospital notes, images, labs for full details of the hospitalization. Dr. Toney Sang HPI: Jerry Burns a84 y.o.male,history of hypertension, diabetes mellitus type 2, hyperlipidemia, coronary artery disease status post stent to theproximal LAD,Came to hospital with worsening shortness of breath.Patient says that for the past 2 weeks shortness of breath has gotten worse. And he is too dyspneic to lie flat and had to sit up all night.  He admits to coughing up clear phlegm. No fever but has been feeling cold. Denies nausea vomiting or diarrhea. Denies chest pain.  He went to his PCP today where fingerstick hemoglobin showed hemoglobin of 7.7 whereas CBC showed hemoglobin around 11.  In the ED patient was given Lasix 40 mg IV with excellent diuretic response.  He was found to have bilateral lower extremity swelling left more than right, concerning for venous thromboembolism. CTA chest ordered but could not be done as patient unable to lie flat. IV heparin started in the ED empirically for presumed PE.      History of Present Illness  09/13/2018  Pulmonary/ 1st office eval/Aryianna Earwood  Chief Complaint  Patient presents with  . Pulmonary Consult    Referred by Dr. Wolfgang Phoenix. Pt c/o DOE x 6 wks. He feels that it's improving.   Dyspnea:  MMRC2 = can't walk a nl pace on a flat grade s sob but does fine slow and flat [employer] shopping Cough: almost gone Sleep: still in recliner but down to incline 60 degrees says this is def  improved  SABA use: no better with sba  rec No chance rx    10/24/18  televisit Worse cough  rec Prednisone 10 mg take  4 each am x 2 days,   2 each am x 2 days,  1 each am x 2 days and stop  zpak  For cough robiutussin with codeine 1-2 tsp every 4 hours if neeeded Stop acupril and start diovan 160 mg (valsartan) in its place and the cough should gradually improve over the next sev weeks    Virtual Visit via Telephone Note 12/22/2018   I connected with Jerry Burns on 12/22/18 at  1:30 PM EDT by telephone and verified that I am speaking with the correct person using two identifiers.   I discussed the limitations, risks, security and privacy concerns of performing an evaluation and management service by telephone and the availability of in person appointments. I also discussed with the patient that there may be a patient responsible charge related to this service. The patient expressed understanding and agreed to  proceed.   History of Present Illness: Dyspnea:  20 min walking outside Cough: marked improvement  Sleeping: able to lie flat/ one SABA use: none 02: no    No obvious day to day or daytime variability or assoc excess/ purulent sputum or mucus plugs or hemoptysis or cp or chest tightness, subjective wheeze or overt sinus or hb symptoms.    Also denies any obvious fluctuation of symptoms with weather or environmental changes or other aggravating or alleviating factors except as outlined above.   Meds reviewed/ med reconciliation completed           Observations/Objective: Still slt gravely voice/ speaking in full sentences    Assessment and Plan: See problem list for active a/p's   Follow Up Instructions: See avs for instructions unique to this ov which includes revised/ updated med list     I discussed the assessment and treatment plan with the patient. The patient was provided an opportunity to ask questions and all were answered. The patient agreed with the plan and demonstrated an understanding of the instructions.   The patient was advised to call back or seek an in-person evaluation if the symptoms worsen or if the condition fails to improve as anticipated.  I provided 24 minutes of non-face-to-face time during this encounter.   Christinia Gully, MD

## 2018-12-22 NOTE — Assessment & Plan Note (Signed)
Adequate control on present rx, reviewed in detail with pt > no change in rx needed  = diovan 160 mg daily and f/u pcp

## 2018-12-22 NOTE — Assessment & Plan Note (Signed)
Plan was for 6 m rx with eliquis  then repeat venous dopplers > PCP or here pnr

## 2018-12-22 NOTE — Assessment & Plan Note (Signed)
Onset July 2019 while on baby asa then much worse since the first of 2020  Echo 09/03/2018  1. The left ventricle has normal systolic function of 89-16%. The cavity size was normal. There is no increased left ventricular wall thickness. Echo evidence of impaired diastolic relaxation Normal left ventricular filling pressures.  2. The right ventricle has normal systolic function. The cavity was normal. There is no increase in right ventricular wall thickness.  3. Left atrial size was mildly dilated.  4. The mitral valve is normal in structure. No evidence of mitral valve stenosis.  5. The tricuspid valve is normal in structure.  6. The aortic valve is tricuspid.  7. The aortic root is normal in size and structure -  Venous dopplers pos on R with L > R effusions and unable to lie flat for CTa with d dimer 4.68  - 09/13/2018 feeling better on DOAC /demadex  - 12/22/2018 much better off acei > continue off (see hbp)

## 2018-12-22 NOTE — Assessment & Plan Note (Signed)
Try off acei 10/24/2018 > resolved 12/22/2018  Continue off   Although even in retrospect it may not be clear the ACEi contributed to the pt's symptoms,  Pt improved off them and adding them back at this point or in the future would risk confusion in interpretation of non-specific respiratory symptoms to which this patient is prone  ie  Better not to muddy the waters here.

## 2018-12-22 NOTE — Patient Instructions (Addendum)
No change in recommendations   Patient has My Chart and no paper copy needed  Please schedule a follow up office visit in 6 weeks, call sooner if needed with cxr on return

## 2018-12-22 NOTE — Assessment & Plan Note (Signed)
Needs f/u cxr in 6 weeks  Each maintenance medication was reviewed in detail including most importantly the difference between maintenance and as needed and under what circumstances the prns are to be used.  Please see AVS for specific  Instructions which are unique to this visit and I personally typed out  which were reviewed in detail over the phonewith the patient and a copy provided via MyChart

## 2018-12-27 ENCOUNTER — Other Ambulatory Visit: Payer: Self-pay | Admitting: Family Medicine

## 2018-12-27 DIAGNOSIS — E119 Type 2 diabetes mellitus without complications: Secondary | ICD-10-CM | POA: Diagnosis not present

## 2018-12-27 DIAGNOSIS — E785 Hyperlipidemia, unspecified: Secondary | ICD-10-CM | POA: Diagnosis not present

## 2018-12-28 ENCOUNTER — Encounter: Payer: Self-pay | Admitting: Family Medicine

## 2018-12-28 LAB — LIPID PANEL
Chol/HDL Ratio: 3.3 ratio (ref 0.0–5.0)
Cholesterol, Total: 161 mg/dL (ref 100–199)
HDL: 49 mg/dL (ref >39)
LDL Calculated: 100 mg/dL — ABNORMAL HIGH (ref 0–99)
Triglycerides: 58 mg/dL (ref 0–149)
VLDL Cholesterol Cal: 12 mg/dL (ref 5–40)

## 2018-12-28 LAB — HEMOGLOBIN A1C
Est. average glucose Bld gHb Est-mCnc: 154 mg/dL
Hgb A1c MFr Bld: 7 % — ABNORMAL HIGH (ref 4.8–5.6)

## 2019-01-10 ENCOUNTER — Inpatient Hospital Stay (HOSPITAL_COMMUNITY): Payer: Medicare Other | Attending: Hematology

## 2019-01-10 ENCOUNTER — Other Ambulatory Visit: Payer: Self-pay

## 2019-01-10 DIAGNOSIS — D649 Anemia, unspecified: Secondary | ICD-10-CM

## 2019-01-10 DIAGNOSIS — Z8249 Family history of ischemic heart disease and other diseases of the circulatory system: Secondary | ICD-10-CM | POA: Diagnosis not present

## 2019-01-10 DIAGNOSIS — Z79899 Other long term (current) drug therapy: Secondary | ICD-10-CM | POA: Diagnosis not present

## 2019-01-10 DIAGNOSIS — Z86718 Personal history of other venous thrombosis and embolism: Secondary | ICD-10-CM | POA: Insufficient documentation

## 2019-01-10 DIAGNOSIS — M1990 Unspecified osteoarthritis, unspecified site: Secondary | ICD-10-CM | POA: Diagnosis not present

## 2019-01-10 DIAGNOSIS — I10 Essential (primary) hypertension: Secondary | ICD-10-CM | POA: Insufficient documentation

## 2019-01-10 DIAGNOSIS — E785 Hyperlipidemia, unspecified: Secondary | ICD-10-CM | POA: Diagnosis not present

## 2019-01-10 DIAGNOSIS — Z833 Family history of diabetes mellitus: Secondary | ICD-10-CM | POA: Diagnosis not present

## 2019-01-10 DIAGNOSIS — Z882 Allergy status to sulfonamides status: Secondary | ICD-10-CM | POA: Diagnosis not present

## 2019-01-10 DIAGNOSIS — Z800 Family history of malignant neoplasm of digestive organs: Secondary | ICD-10-CM | POA: Insufficient documentation

## 2019-01-10 DIAGNOSIS — Z794 Long term (current) use of insulin: Secondary | ICD-10-CM | POA: Diagnosis not present

## 2019-01-10 DIAGNOSIS — Z87891 Personal history of nicotine dependence: Secondary | ICD-10-CM | POA: Insufficient documentation

## 2019-01-10 DIAGNOSIS — Z7901 Long term (current) use of anticoagulants: Secondary | ICD-10-CM | POA: Diagnosis not present

## 2019-01-10 DIAGNOSIS — R0602 Shortness of breath: Secondary | ICD-10-CM | POA: Insufficient documentation

## 2019-01-10 DIAGNOSIS — Z888 Allergy status to other drugs, medicaments and biological substances status: Secondary | ICD-10-CM | POA: Insufficient documentation

## 2019-01-10 DIAGNOSIS — D123 Benign neoplasm of transverse colon: Secondary | ICD-10-CM | POA: Insufficient documentation

## 2019-01-10 DIAGNOSIS — R5383 Other fatigue: Secondary | ICD-10-CM | POA: Diagnosis not present

## 2019-01-10 DIAGNOSIS — Z808 Family history of malignant neoplasm of other organs or systems: Secondary | ICD-10-CM | POA: Insufficient documentation

## 2019-01-10 DIAGNOSIS — E119 Type 2 diabetes mellitus without complications: Secondary | ICD-10-CM | POA: Insufficient documentation

## 2019-01-10 DIAGNOSIS — Z8673 Personal history of transient ischemic attack (TIA), and cerebral infarction without residual deficits: Secondary | ICD-10-CM | POA: Diagnosis not present

## 2019-01-10 LAB — CBC WITH DIFFERENTIAL/PLATELET
Abs Immature Granulocytes: 0.02 10*3/uL (ref 0.00–0.07)
Basophils Absolute: 0.1 10*3/uL (ref 0.0–0.1)
Basophils Relative: 1 %
Eosinophils Absolute: 0.6 10*3/uL — ABNORMAL HIGH (ref 0.0–0.5)
Eosinophils Relative: 8 %
HCT: 32.3 % — ABNORMAL LOW (ref 39.0–52.0)
Hemoglobin: 10.4 g/dL — ABNORMAL LOW (ref 13.0–17.0)
Immature Granulocytes: 0 %
Lymphocytes Relative: 14 %
Lymphs Abs: 0.9 10*3/uL (ref 0.7–4.0)
MCH: 29.3 pg (ref 26.0–34.0)
MCHC: 32.2 g/dL (ref 30.0–36.0)
MCV: 91 fL (ref 80.0–100.0)
Monocytes Absolute: 0.8 10*3/uL (ref 0.1–1.0)
Monocytes Relative: 12 %
Neutro Abs: 4.3 10*3/uL (ref 1.7–7.7)
Neutrophils Relative %: 65 %
Platelets: 232 10*3/uL (ref 150–400)
RBC: 3.55 MIL/uL — ABNORMAL LOW (ref 4.22–5.81)
RDW: 14.4 % (ref 11.5–15.5)
WBC: 6.6 10*3/uL (ref 4.0–10.5)
nRBC: 0 % (ref 0.0–0.2)

## 2019-01-10 LAB — COMPREHENSIVE METABOLIC PANEL
ALT: 14 U/L (ref 0–44)
AST: 15 U/L (ref 15–41)
Albumin: 3.5 g/dL (ref 3.5–5.0)
Alkaline Phosphatase: 83 U/L (ref 38–126)
Anion gap: 7 (ref 5–15)
BUN: 26 mg/dL — ABNORMAL HIGH (ref 8–23)
CO2: 26 mmol/L (ref 22–32)
Calcium: 9.1 mg/dL (ref 8.9–10.3)
Chloride: 104 mmol/L (ref 98–111)
Creatinine, Ser: 1 mg/dL (ref 0.61–1.24)
GFR calc Af Amer: >60 mL/min (ref >60)
GFR calc non Af Amer: >60 mL/min (ref >60)
Glucose, Bld: 144 mg/dL — ABNORMAL HIGH (ref 70–99)
Potassium: 5.2 mmol/L — ABNORMAL HIGH (ref 3.5–5.1)
Sodium: 137 mmol/L (ref 135–145)
Total Bilirubin: 0.6 mg/dL (ref 0.3–1.2)
Total Protein: 7.1 g/dL (ref 6.5–8.1)

## 2019-01-10 LAB — IRON AND TIBC
Iron: 49 ug/dL (ref 45–182)
Saturation Ratios: 19 % (ref 17.9–39.5)
TIBC: 262 ug/dL (ref 250–450)
UIBC: 213 ug/dL

## 2019-01-10 LAB — VITAMIN B12: Vitamin B-12: 561 pg/mL (ref 180–914)

## 2019-01-10 LAB — FERRITIN: Ferritin: 189 ng/mL (ref 24–336)

## 2019-01-10 LAB — LACTATE DEHYDROGENASE: LDH: 114 U/L (ref 98–192)

## 2019-01-12 ENCOUNTER — Other Ambulatory Visit: Payer: Self-pay | Admitting: Family Medicine

## 2019-01-14 ENCOUNTER — Other Ambulatory Visit: Payer: Self-pay | Admitting: Family Medicine

## 2019-01-16 ENCOUNTER — Other Ambulatory Visit: Payer: Self-pay | Admitting: Family Medicine

## 2019-01-18 ENCOUNTER — Inpatient Hospital Stay (HOSPITAL_BASED_OUTPATIENT_CLINIC_OR_DEPARTMENT_OTHER): Payer: Medicare Other | Admitting: Nurse Practitioner

## 2019-01-18 ENCOUNTER — Other Ambulatory Visit: Payer: Self-pay

## 2019-01-18 DIAGNOSIS — E119 Type 2 diabetes mellitus without complications: Secondary | ICD-10-CM

## 2019-01-18 DIAGNOSIS — Z808 Family history of malignant neoplasm of other organs or systems: Secondary | ICD-10-CM

## 2019-01-18 DIAGNOSIS — Z882 Allergy status to sulfonamides status: Secondary | ICD-10-CM

## 2019-01-18 DIAGNOSIS — Z8249 Family history of ischemic heart disease and other diseases of the circulatory system: Secondary | ICD-10-CM

## 2019-01-18 DIAGNOSIS — D649 Anemia, unspecified: Secondary | ICD-10-CM

## 2019-01-18 DIAGNOSIS — E785 Hyperlipidemia, unspecified: Secondary | ICD-10-CM

## 2019-01-18 DIAGNOSIS — M1990 Unspecified osteoarthritis, unspecified site: Secondary | ICD-10-CM | POA: Diagnosis not present

## 2019-01-18 DIAGNOSIS — I10 Essential (primary) hypertension: Secondary | ICD-10-CM | POA: Diagnosis not present

## 2019-01-18 DIAGNOSIS — Z86718 Personal history of other venous thrombosis and embolism: Secondary | ICD-10-CM

## 2019-01-18 DIAGNOSIS — Z888 Allergy status to other drugs, medicaments and biological substances status: Secondary | ICD-10-CM

## 2019-01-18 DIAGNOSIS — Z7901 Long term (current) use of anticoagulants: Secondary | ICD-10-CM | POA: Diagnosis not present

## 2019-01-18 DIAGNOSIS — R5383 Other fatigue: Secondary | ICD-10-CM

## 2019-01-18 DIAGNOSIS — Z8673 Personal history of transient ischemic attack (TIA), and cerebral infarction without residual deficits: Secondary | ICD-10-CM

## 2019-01-18 DIAGNOSIS — Z79899 Other long term (current) drug therapy: Secondary | ICD-10-CM | POA: Diagnosis not present

## 2019-01-18 DIAGNOSIS — Z800 Family history of malignant neoplasm of digestive organs: Secondary | ICD-10-CM

## 2019-01-18 DIAGNOSIS — Z833 Family history of diabetes mellitus: Secondary | ICD-10-CM

## 2019-01-18 DIAGNOSIS — D123 Benign neoplasm of transverse colon: Secondary | ICD-10-CM

## 2019-01-18 DIAGNOSIS — Z87891 Personal history of nicotine dependence: Secondary | ICD-10-CM

## 2019-01-18 DIAGNOSIS — Z794 Long term (current) use of insulin: Secondary | ICD-10-CM

## 2019-01-18 DIAGNOSIS — R0602 Shortness of breath: Secondary | ICD-10-CM | POA: Diagnosis not present

## 2019-01-18 NOTE — Assessment & Plan Note (Addendum)
1.  Normocytic anemia: - Thought to be from GI blood loss. -He had a hemoglobin of 9.7 in September 2019 followed by a GI work-up. -He had an EGD on 05/18/2018 showed erosive reflux esophagitis, erosive gastropathy.  Biopsy was consistent with chronic active gastritis with H. pylori.  It was thought that he could have been bleeding from upper GI. -Colonoscopy on 05/18/2018 showed diverticulosis of the sigmoid colon, descending colon and transverse colon.  Tubular adenoma from hepatic flexure was resected. - He was admitted to the hospital on 09/02/2017 and diagnosed with a possible nonocclusive thrombus of the determinate acuity within 1 of the paired posterior tibial veins in the right calf.  He was started on Eliquis.  It was also found that he had a hemoglobin of 7.7.  Ferritin was 75 and percent saturation was 9. -He denies any history of blood transfusions.  Denies any B symptoms. - For his work-up his CBC was repeated hemoglobin showed 10.3.  Stool for occult blood x3 was negative.  Direct Coombs test and LDH were normal.  X48 and folic acid were normal. - His last Feraheme infusion was on 10/03/2018 and 10/12/2018. - Labs on 01/10/2019 showed his hemoglobin 10.4, ferritin 189, percent saturation 19, creatinine 1.0, potassium 5.2 -I have recommended 2 more infusions of IV iron. - He will follow-up in 4 months with repeat labs.  2.  Distal DVT: - He had an ultrasound on 09/03/2018 showed possible nonocclusive thrombus of the intermediate acuity within 1 of the posterior tibial veins in the right calf. - He is currently on Eliquis 5 mg twice daily and tolerating well.  That was started in February 2019. - A CT scan of the chest PE protocol could not be done as he is unable to lie flat at that time. -He reports he is now able to lie flat and take deep breaths.

## 2019-01-18 NOTE — Progress Notes (Signed)
Jerry Burns, [place] [address]   CLINIC:  Medical Oncology/Hematology  PCP:  Kathyrn Drown, MD [address] [place] [address] [phone]   REASON FOR VISIT: Follow-up for normocytic anemia  CURRENT THERAPY: Intermittent iron infusions   INTERVAL HISTORY:  Jerry Burns 85 y.o. male returns for routine follow-up for normocytic anemia.  He reports the 2 iron infusions in March really helped his energy levels.  However he is starting to notice he is more fatigued during the day and does not have the stamina he used to.  He denies any bright red bleeding per rectum or melena.  He denies any easy bruising.  He does occasionally get short of breath with exertion.  Denies any nausea, vomiting, or diarrhea. Denies any new pains. Had not noticed any recent bleeding such as epistaxis, hematuria or hematochezia. Denies recent chest pain on exertion, shortness of breath on minimal exertion, pre-syncopal episodes, or palpitations. Denies any numbness or tingling in hands or feet. Denies any recent fevers, infections, or recent hospitalizations. Patient reports appetite at 100% and energy level at 35%.  He is eating well and maintaining his weight at this time.    REVIEW OF SYSTEMS:  Review of Systems  Constitutional: Positive for fatigue.  Respiratory: Positive for shortness of breath.   All other systems reviewed and are negative.    PAST MEDICAL/SURGICAL HISTORY:  Past Medical History:  Diagnosis Date  . Anginal pain ([place])   . Arthritis    Bilateral knee  . Coronary artery disease   . Diabetes mellitus    insulin dependent   . DVT, lower extremity, distal, acute ([place]) 09/09/2018  . Hyperlipidemia   . Hypertension   . Progressive angina ([place]) 03/2015   a. s/p DES to proximal LAD and balloon angioplasty to distal LAD in 2016 b. patent stent by repeat cath in 01/2017 with mild residual CAD)  . Stroke [facility])    Past Surgical History:   Procedure Laterality Date  . APPENDECTOMY    . CARDIAC CATHETERIZATION N/A 04/12/2015   Procedure: Left Heart Cath and Coronary Angiography;  Surgeon: Sherren Mocha, MD; pLAD 75>0% w/ 3.0x12 mm Resolute DES, dLAD 80>0% w/ Angiosculpt scoring balloon, CFX luminal irreg, RCA mild dz, EF 55-65%   . COLONOSCOPY  2011   RMR: 1. Normal rectum 2. Left sided diverticula , 2 ascending colon polyps, status post snare polypectomy. Remainder of colonic mucosa appeared unremarkable.   . COLONOSCOPY N/A 05/13/2016   Procedure: COLONOSCOPY;  Surgeon: Daneil Dolin, MD;  Location[facility] SU[facility];  Service: Endoscopy;  Laterality: N/A;  7:30 AM  . COLONOSCOPY N/A 05/18/2018   Procedure: COLONOSCOPY;  Surgeon: Daneil Dolin, MD;  Location[facility];  Service: Endoscopy;  Laterality: N/A;  1:15pm  . CORONARY ANGIOGRAPHY N/A 02/04/2017   Procedure: CORONARY ANGIOGRAPHY (CATH LAB);  Surgeon: Troy Sine, MD;  Location: [place] CV LAB;  Service: Cardiovascular;  Laterality: N/A;  . CORONARY ANGIOPLASTY WITH STENT PLACEMENT  08/22/2014   PTCA/DES X mLAD with 2.5 x 16 mm Promus Premier DES by Dr Julianne Handler  . ESOPHAGOGASTRODUODENOSCOPY N/A 05/18/2018   Procedure: ESOPHAGOGASTRODUODENOSCOPY (EGD);  Surgeon: Daneil Dolin, MD;  [facility];  Service: Endoscopy;  Laterality: N/A;  . FOOT SURGERY Right   . LEFT HEART CATHETERIZATION WITH CORONARY ANGIOGRAM N/A 08/22/2014   Procedure: LEFT HEART CATHETERIZATION WITH CORONARY ANGIOGRAM;  Surgeon: Burnell Blanks, MD;  Location: [facility]  CATH LAB;  Service: Cardiovascular;  Laterality: N/A;  . PERCUTANEOUS CORONARY STENT INTERVENTION (PCI-S)  08/22/2014   Procedure: PERCUTANEOUS CORONARY STENT INTERVENTION (PCI-S);  Surgeon: Burnell Blanks, MD;   . SHOULDER SURGERY    . TONSILLECTOMY       SOCIAL HISTORY:  Social History   Socioeconomic History  . Marital status: Married    Spouse name: Not on file  . Number of children: Not on  file  . Years of education: 67  . Highest education level: Not on file  Occupational History  . Occupation: retired  Scientific laboratory technician  . Financial resource strain: Not on file  . Food insecurity    Worry: Not on file    Inability: Not on file  . Transportation needs    Medical: Not on file    Non-medical: Not on file  Tobacco Use  . Smoking status: Former Smoker    Packs/day: 1.00    Years: 20.00    Pack years: 20.00    Types: Cigarettes, Cigars    Start date: 08/10/1955    Quit date: 07/28/2007    Years since quitting: 11.4  . Smokeless tobacco: Never Used  Substance and Sexual Activity  . Alcohol use: Not Currently    Alcohol/week: 0.0 standard drinks    Comment: Daily about 1 glass of wine  . Drug use: No  . Sexual activity: Not Currently  Lifestyle  . Physical activity    Days per week: Not on file    Minutes per session: Not on file  . Stress: Not on file  Relationships  . Social Herbalist on phone: Not on file    Gets together: Not on file    Attends religious service: Not on file    Active member of club or organization: Not on file    Attends meetings of clubs or organizations: Not on file    Relationship status: Not on file  . Intimate partner violence    Fear of current or ex partner: Not on file    Emotionally abused: Not on file    Physically abused: Not on file    Forced sexual activity: Not on file  Other Topics Concern  . Not on file  Social History Narrative  . Not on file    FAMILY HISTORY:  Family History  Problem Relation Age of Onset  . Diabetes Other   . Diabetes Brother   . Colon cancer Maternal Uncle   . Skin cancer Mother   . Diabetes Father   . Hypertension Sister   . Skin cancer Sister     CURRENT MEDICATIONS:  Outpatient Encounter Medications as of 01/18/2019  Medication Sig Note  . amLODipine (NORVASC) 10 MG tablet Take 1 tablet (10 mg total) by mouth daily.   Marland Kitchen apixaban (ELIQUIS) 5 MG TABS tablet Take one tablet by  mouth twice daily   . Ascorbic Acid (VITAMIN C) 1000 MG tablet Take 500 mg by mouth daily.   Marland Kitchen atorvastatin (LIPITOR) 80 MG tablet TAKE (1) TABLET BY MOUTH ONCE DAILY.   . carvedilol (COREG) 12.5 MG tablet TAKE (1) TABLET BY MOUTH TWICE DAILY.   . cholecalciferol (VITAMIN D3) 25 MCG (1000 UT) tablet Take 1,000 Units by mouth daily.   . COMBIGAN 0.2-0.5 % ophthalmic solution Place 1 drop into the left eye 2 (two) times daily.    . CONTOUR NEXT TEST test strip USE 1 STRIP TO CHECK GLUCOSE THREE TIMES DAILY   .  dextromethorphan (DELSYM) 30 MG/5ML liquid Take 5 mLs (30 mg total) by mouth 2 (two) times daily as needed for cough.   . dutasteride (AVODART) 0.5 MG capsule TAKE ONE CAPSULE BY MOUTH ONCE DAILY.   Marland Kitchen Emollient (CERAVE) CREA Apply 1 application topically daily as needed (dry skin).   . Flaxseed, Linseed, (FLAXSEED OIL) 1200 MG CAPS Take 1,200 mg by mouth daily.   Marland Kitchen guaiFENesin-codeine 100-10 MG/5ML syrup Take 10 mLs by mouth every 4 (four) hours as needed for cough.   . Insulin Glargine (LANTUS SOLOSTAR) 100 UNIT/ML Solostar Pen INJECT 22 UNITS SUBCUTANEOUSLY ONCE DAILY AT 10PM   . JANUVIA 100 MG tablet TAKE (1) TABLET BY MOUTH ONCE DAILY.   . metFORMIN (GLUCOPHAGE) 1000 MG tablet TAKE (1) TABLET BY MOUTH TWICE A DAY WITH A MEAL.   . Multiple Vitamin (MULTIVITAMIN) tablet Take 1 tablet by mouth daily.   . nitroGLYCERIN (NITROSTAT) 0.4 MG SL tablet PLACE 1 TAB UNDER TONGUE EVERY 5 MIN IF NEEDED FOR CHEST PAIN. MAY USE 3 TIMES.NO RELIEF CALL 911.   . pantoprazole (PROTONIX) 40 MG tablet TAKE ONE TABLET BY MOUTH ONCE DAILY.   . Polyvinyl Alcohol-Povidone (REFRESH OP) Place 1 drop into the left eye 2 (two) times daily.   . psyllium (METAMUCIL) 58.6 % powder Take 1 packet by mouth daily.    . quinapril (ACCUPRIL) 5 MG tablet Take 5 mg by mouth at bedtime.   . tamsulosin (FLOMAX) 0.4 MG CAPS capsule TAKE (1) CAPSULE BY MOUTH ONCE DAILY.   . Turmeric 500 MG CAPS Take 500 mg by mouth daily.   .  valsartan (DIOVAN) 160 MG tablet TAKE 1 TABLET BY MOUTH ONCE A DAY.   Marland Kitchen ELIQUIS DVT/PE STARTER PACK (ELIQUIS STARTER PACK) 5 MG TABS Take as directed on package: start with two-5mg  tablets twice daily for 7 days. On day 8, switch to one-5mg  tablet twice daily. 09/08/2018: Patient is on starter pack; currently on 10mg  dosing  . torsemide (DEMADEX) 20 MG tablet TAKE (1) TABLET BY MOUTH EACH MORNING. (Patient not taking: Reported on 01/18/2019)    No facility-administered encounter medications on file as of 01/18/2019.     ALLERGIES:  Allergies  Allergen Reactions  . Adhesive [Tape]     bandaids cause blisters after leaving on for 24 hours   . Sulfa Antibiotics Other (See Comments)    "was terrible" "eye went crazy"  . Latex Other (See Comments)    blisters     PHYSICAL EXAM:  ECOG Performance status: 1  Vitals:   01/18/19 0850  BP: (!) 136/59  Pulse: 68  Resp: 16  Temp: 98.1 F (36.7 C)  SpO2: 97%   Filed Weights   01/18/19 0850  Weight: 200 lb 12.8 oz (91.1 kg)    Physical Exam Constitutional:      Appearance: Normal appearance. He is normal weight.  Cardiovascular:     Rate and Rhythm: Normal rate and regular rhythm.     Heart sounds: Normal heart sounds.  Pulmonary:     Effort: Pulmonary effort is normal.     Breath sounds: Normal breath sounds.  Abdominal:     General: Bowel sounds are normal.     Palpations: Abdomen is soft.  Musculoskeletal: Normal range of motion.  Skin:    General: Skin is warm and dry.  Neurological:     Mental Status: He is alert and oriented to person, place, and time. Mental status is at baseline.  Psychiatric:  Mood and Affect: Mood normal.        Behavior: Behavior normal.        Thought Content: Thought content normal.        Judgment: Judgment normal.      LABORATORY DATA:  I have reviewed the labs as listed.  CBC    Component Value Date/Time   WBC 6.6 01/10/2019 0857   RBC 3.55 (L) 01/10/2019 0857   HGB 10.4 (L)  01/10/2019 0857   HGB 9.9 (L) 10/06/2018 0844   HCT 32.3 (L) 01/10/2019 0857   HCT 29.8 (L) 10/06/2018 0844   PLT 232 01/10/2019 0857   PLT 260 10/06/2018 0844   MCV 91.0 01/10/2019 0857   MCV 81 10/06/2018 0844   MCH 29.3 01/10/2019 0857   MCHC 32.2 01/10/2019 0857   RDW 14.4 01/10/2019 0857   RDW 13.9 10/06/2018 0844   LYMPHSABS 0.9 01/10/2019 0857   LYMPHSABS 1.1 10/06/2018 0844   MONOABS 0.8 01/10/2019 0857   EOSABS 0.6 (H) 01/10/2019 0857   EOSABS 0.5 (H) 10/06/2018 0844   BASOSABS 0.1 01/10/2019 0857   BASOSABS 0.1 10/06/2018 0844   CMP Latest Ref Rng & Units 01/10/2019 11/30/2018 09/09/2018  Glucose 70 - 99 mg/dL 144(H) 150(H) 153(H)  BUN 8 - 23 mg/dL 26(H) 36(H) 25(H)  Creatinine 0.61 - 1.24 mg/dL 1.00 1.10 1.06  Sodium 135 - 145 mmol/L 137 138 130(L)  Potassium 3.5 - 5.1 mmol/L 5.2(H) 5.5(H) 4.8  Chloride 98 - 111 mmol/L 104 100 96(L)  CO2 22 - 32 mmol/L 26 27 25   Calcium 8.9 - 10.3 mg/dL 9.1 9.3 9.3  Total Protein 6.5 - 8.1 g/dL 7.1 7.4 7.5  Total Bilirubin 0.3 - 1.2 mg/dL 0.6 0.7 0.6  Alkaline Phos 38 - 126 U/L 83 91 91  AST 15 - 41 U/L 15 20 21   ALT 0 - 44 U/L 14 16 19       I personally performed a face-to-face visit.  All questions were answered to patient's stated satisfaction. Encouraged patient to call with any new concerns or questions before his next visit to the cancer center and we can certain see him sooner, if needed.     ASSESSMENT & PLAN:   Normochromic normocytic anemia 1.  Normocytic anemia: - Thought to be from GI blood loss. -He had a hemoglobin of 9.7 in September 2019 followed by a GI work-up. -He had an EGD on 05/18/2018 showed erosive reflux esophagitis, erosive gastropathy.  Biopsy was consistent with chronic active gastritis with H. pylori.  It was thought that he could have been bleeding from upper GI. -Colonoscopy on 05/18/2018 showed diverticulosis of the sigmoid colon, descending colon and transverse colon.  Tubular adenoma from  hepatic flexure was resected. - He was admitted to the hospital on 09/02/2017 and diagnosed with a possible nonocclusive thrombus of the determinate acuity within 1 of the paired posterior tibial veins in the right calf.  He was started on Eliquis.  It was also found that he had a hemoglobin of 7.7.  Ferritin was 75 and percent saturation was 9. -He denies any history of blood transfusions.  Denies any B symptoms. - For his work-up his CBC was repeated hemoglobin showed 10.3.  Stool for occult blood x3 was negative.  Direct Coombs test and LDH were normal.  M57 and folic acid were normal. - His last Feraheme infusion was on 10/03/2018 and 10/12/2018. - Labs on 01/10/2019 showed his hemoglobin 10.4, ferritin 189, percent saturation 19, creatinine 1.0, potassium  5.2 -I have recommended 2 more infusions of IV iron. - He will follow-up in 4 months with repeat labs.  2.  Distal DVT: - He had an ultrasound on 09/03/2018 showed possible nonocclusive thrombus of the intermediate acuity within 1 of the posterior tibial veins in the right calf. - He is currently on Eliquis 5 mg twice daily and tolerating well.  That was started in February 2019. - A CT scan of the chest PE protocol could not be done as he is unable to lie flat at that time. -He reports he is now able to lie flat and take deep breaths.      Orders placed this encounter:  Orders Placed This Encounter  Procedures  . Lactate dehydrogenase  . CBC with Differential/Platelet  . Comprehensive metabolic panel  . Ferritin  . Iron and TIBC  . Vitamin B12  . VITAMIN D 25 Hydroxy (Vit-D Deficiency, Fractures)  . Folate      Francene Finders, FNP-C [facility] [phone]

## 2019-01-18 NOTE — Patient Instructions (Signed)
[facility] at [facility] Discharge Instructions Follow up in 4 months with labs    Thank you for choosing [facility] at [facility] to provide your oncology and hematology care.  To afford each patient quality time with our provider, please arrive at least 15 minutes before your scheduled appointment time.   If you have a lab appointment with the Cancer Center please come in thru the  Main Entrance and check in at the main information desk  You need to re-schedule your appointment should you arrive 10 or more minutes late.  We strive to give you quality time with our providers, and arriving late affects you and other patients whose appointments are after yours.  Also, if you no show three or more times for appointments you may be dismissed from the clinic at the providers discretion.     Again, thank you for choosing [facility].  Our hope is that these requests will decrease the amount of time that you wait before being seen by our physicians.       _____________________________________________________________  Should you have questions after your visit to [facility], please contact our office at [phone] between the hours of 8:00 a.m. and 4:30 p.m.  Voicemails left after 4:00 p.m. will not be returned until the following business day.  For prescription refill requests, have your pharmacy contact our office and allow 72 hours.    Cancer Center Support Programs:   > Cancer Support Group  2nd Tuesday of the month 1pm-2pm, Journey Room    

## 2019-01-20 ENCOUNTER — Inpatient Hospital Stay (HOSPITAL_COMMUNITY): Payer: Medicare Other

## 2019-01-20 ENCOUNTER — Other Ambulatory Visit: Payer: Self-pay

## 2019-01-20 ENCOUNTER — Encounter (HOSPITAL_COMMUNITY): Payer: Self-pay

## 2019-01-20 VITALS — BP 135/52 | HR 64 | Temp 97.7°F | Resp 18

## 2019-01-20 DIAGNOSIS — D649 Anemia, unspecified: Secondary | ICD-10-CM

## 2019-01-20 DIAGNOSIS — R0602 Shortness of breath: Secondary | ICD-10-CM | POA: Diagnosis not present

## 2019-01-20 DIAGNOSIS — M1990 Unspecified osteoarthritis, unspecified site: Secondary | ICD-10-CM | POA: Diagnosis not present

## 2019-01-20 DIAGNOSIS — R5383 Other fatigue: Secondary | ICD-10-CM | POA: Diagnosis not present

## 2019-01-20 DIAGNOSIS — E119 Type 2 diabetes mellitus without complications: Secondary | ICD-10-CM | POA: Diagnosis not present

## 2019-01-20 DIAGNOSIS — I10 Essential (primary) hypertension: Secondary | ICD-10-CM | POA: Diagnosis not present

## 2019-01-20 MED ORDER — SODIUM CHLORIDE 0.9 % IV SOLN
INTRAVENOUS | Status: DC
  Administered 2019-01-20: 09:00:00 via INTRAVENOUS

## 2019-01-20 MED ORDER — SODIUM CHLORIDE 0.9 % IV SOLN
510.0000 mg | Freq: Once | INTRAVENOUS | Status: AC
  Administered 2019-01-20: 09:00:00 510 mg via INTRAVENOUS
  Filled 2019-01-20: qty 17

## 2019-01-20 NOTE — Progress Notes (Signed)
Pt presents today for Feraheme infusion. VSS. MAR reviewed and updated. Pt has no complaints of pain or any changes since the last visit.   Feraheme given today per MD orders. Tolerated infusion without adverse affects. Vital signs stable. No complaints at this time. Discharged from clinic ambulatory. F/U with [facility] as scheduled.

## 2019-01-25 ENCOUNTER — Other Ambulatory Visit: Payer: Self-pay | Admitting: Family Medicine

## 2019-01-25 NOTE — Telephone Encounter (Signed)
This +3 refills 

## 2019-01-26 ENCOUNTER — Ambulatory Visit: Payer: Medicare Other | Admitting: Internal Medicine

## 2019-01-30 ENCOUNTER — Other Ambulatory Visit: Payer: Self-pay

## 2019-01-30 ENCOUNTER — Inpatient Hospital Stay (HOSPITAL_COMMUNITY): Payer: Medicare Other | Attending: Hematology

## 2019-01-30 ENCOUNTER — Encounter (HOSPITAL_COMMUNITY): Payer: Self-pay

## 2019-01-30 VITALS — BP 142/59 | HR 64 | Temp 97.8°F | Resp 18

## 2019-01-30 DIAGNOSIS — D649 Anemia, unspecified: Secondary | ICD-10-CM

## 2019-01-30 MED ORDER — SODIUM CHLORIDE 0.9 % IV SOLN
INTRAVENOUS | Status: DC
  Administered 2019-01-30: 14:00:00 via INTRAVENOUS

## 2019-01-30 MED ORDER — SODIUM CHLORIDE 0.9 % IV SOLN
510.0000 mg | Freq: Once | INTRAVENOUS | Status: AC
  Administered 2019-01-30: 510 mg via INTRAVENOUS
  Filled 2019-01-30: qty 510

## 2019-01-30 NOTE — Patient Instructions (Signed)
[facility] at [facility] Discharge Instructions  Received Feraheme infusion today. Follow-up as scheduled. Call clinic for any questions or concerns   Thank you for choosing [facility] at [facility] to provide your oncology and hematology care.  To afford each patient quality time with our provider, please arrive at least 15 minutes before your scheduled appointment time.   If you have a lab appointment with the Cancer Center please come in thru the  Main Entrance and check in at the main information desk  You need to re-schedule your appointment should you arrive 10 or more minutes late.  We strive to give you quality time with our providers, and arriving late affects you and other patients whose appointments are after yours.  Also, if you no show three or more times for appointments you may be dismissed from the clinic at the providers discretion.     Again, thank you for choosing [facility].  Our hope is that these requests will decrease the amount of time that you wait before being seen by our physicians.       _____________________________________________________________  Should you have questions after your visit to [facility], please contact our office at [phone] between the hours of 8:00 a.m. and 4:30 p.m.  Voicemails left after 4:00 p.m. will not be returned until the following business day.  For prescription refill requests, have your pharmacy contact our office and allow 72 hours.    Cancer Center Support Programs:   > Cancer Support Group  2nd Tuesday of the month 1pm-2pm, Journey Room   

## 2019-01-30 NOTE — Progress Notes (Signed)
Jerry Burns tolerated Feraheme infusion well without complaints or incident. Peripheral IV site checked with positive blood return noted prior to and after infusion. VSS upon discharge. Pt discharged self ambulatory in satisfactory condition

## 2019-01-31 ENCOUNTER — Other Ambulatory Visit: Payer: Self-pay | Admitting: Cardiovascular Disease

## 2019-02-01 ENCOUNTER — Ambulatory Visit (INDEPENDENT_AMBULATORY_CARE_PROVIDER_SITE_OTHER): Payer: Medicare Other | Admitting: Internal Medicine

## 2019-02-01 ENCOUNTER — Other Ambulatory Visit: Payer: Self-pay

## 2019-02-01 ENCOUNTER — Ambulatory Visit (INDEPENDENT_AMBULATORY_CARE_PROVIDER_SITE_OTHER): Payer: Medicare Other

## 2019-02-01 ENCOUNTER — Encounter: Payer: Self-pay | Admitting: Internal Medicine

## 2019-02-01 DIAGNOSIS — I2510 Atherosclerotic heart disease of native coronary artery without angina pectoris: Secondary | ICD-10-CM

## 2019-02-01 DIAGNOSIS — R053 Chronic cough: Secondary | ICD-10-CM

## 2019-02-01 DIAGNOSIS — I10 Essential (primary) hypertension: Secondary | ICD-10-CM

## 2019-02-01 DIAGNOSIS — R05 Cough: Secondary | ICD-10-CM

## 2019-02-01 DIAGNOSIS — J90 Pleural effusion, not elsewhere classified: Secondary | ICD-10-CM | POA: Diagnosis not present

## 2019-02-01 DIAGNOSIS — R0609 Other forms of dyspnea: Secondary | ICD-10-CM | POA: Diagnosis not present

## 2019-02-01 MED ORDER — TELMISARTAN 40 MG PO TABS: 40.0000 mg | ORAL_TABLET | Freq: Every day | ORAL | 3 refills | Status: DC

## 2019-02-01 NOTE — Progress Notes (Signed)
Jerry Burns, male    DOB: Dec 20, 1933      MRN: [id number]   Brief patient profile:  45 yowm quit smoking 2009 s apparent sequelae but middle July 2019 acute weakness /sob and eval by cards and pcp with dx of anemia> gi w/u 05/18/18  diverticulosis, sessile colon polys, severe esophagitis ? NSAID effects> not really better despite improved  around 1st Feb new onset orthopnea with mild cough and L > R swelling >  [facility] admitted 09/02/2018:   Admit date: 09/02/2018 Discharge date: 09/05/2018  Admitted From: Home  Disposition: Home   Recommendations for Outpatient Follow-up:  1. Follow up with PCP in 1 weeks 2. Follow up with cardiology in 1-2 weeks 3. Anticoagulate for 3-6 months for PE/DVT 4. Please obtain BMP/CBC in 1 week to follow up.  5. Monitor for bleeding complications.   Discharge Condition: STABLE   CODE STATUS: FULL    Brief Hospitalization Summary: Please see all hospital notes, images, labs for full details of the hospitalization. Dr. Toney Sang HPI: Jerry Burns a84 y.o.male,history of hypertension, diabetes mellitus type 2, hyperlipidemia, coronary artery disease status post stent to theproximal LAD,Came to hospital with worsening shortness of breath.Patient says that for the past 2 weeks shortness of breath has gotten worse. And he is too dyspneic to lie flat and had to sit up all night.  He admits to coughing up clear phlegm. No fever but has been feeling cold. Denies nausea vomiting or diarrhea. Denies chest pain.  He went to his PCP today where fingerstick hemoglobin showed hemoglobin of 7.7 whereas CBC showed hemoglobin around 11.  In the ED patient was given Lasix 40 mg IV with excellent diuretic response.  He was found to have bilateral lower extremity swelling left more than right, concerning for venous thromboembolism. CTA chest ordered but could not be done as patient unable to lie flat. IV heparin started in the ED empirically for presumed PE.         History of Present Illness  09/13/2018  Pulmonary/ 1st office eval/Kayn Haymore  Chief Complaint  Patient presents with  . Pulmonary Consult    Referred by Dr. Wolfgang Phoenix. Pt c/o DOE x 6 wks. He feels that it's improving.   Dyspnea:  MMRC2 = can't walk a nl pace on a flat grade s sob but does fine slow and flat [employer] shopping Cough: almost gone Sleep: still in recliner but down to incline 60% says this is def  improved  SABA use: no better with sba  Dx:  Pleural effusions/ dvt  > rx Doac and symptoms improved     02/01/2019  f/u ov/Dennisha Mouser re: f/u effusions ? Related to PE  Chief Complaint  Patient presents with  . Follow-up    Breathing is much improved.   Dyspnea:  MMRC1 = can walk nl pace, flat grade, can't hurry or go uphills or steps s sob   Cough: daytime clearing throat no excess mucus, req  Narcotic cough meds/ mucinex otc  Sleeping: no noct cough SABA use: none  02: none    No obvious day to day or daytime variability or assoc excess/ purulent sputum or mucus plugs or hemoptysis or cp or chest tightness, subjective wheeze or overt sinus or hb symptoms.   Sleeping  without nocturnal  or early am exacerbation  of respiratory  c/o's or need for noct saba. Also denies any obvious fluctuation of symptoms with weather or environmental changes or other aggravating or alleviating factors except as outlined  above   No unusual exposure hx or h/o childhood pna/ asthma or knowledge of premature birth.  Current Allergies, Complete Past Medical History, Past Surgical History, Family History, and Social History were reviewed in [employer] record.  ROS  The following are not active complaints unless bolded Hoarseness, sore throat, dysphagia, dental problems, itching, sneezing,  nasal congestion or discharge of excess mucus or purulent secretions, ear ache,   fever, chills, sweats, unintended wt loss or wt gain, classically pleuritic or exertional cp,  orthopnea pnd or  arm/hand swelling  or leg swelling, presyncope, palpitations, abdominal pain, anorexia, nausea, vomiting, diarrhea  or change in bowel habits or change in bladder habits, change in stools or change in urine, dysuria, hematuria,  rash, arthralgias, visual complaints, headache, numbness, weakness or ataxia or problems with walking or coordination,  change in mood or  memory.        Current Meds  Medication Sig  . amLODipine (NORVASC) 10 MG tablet Take 1 tablet (10 mg total) by mouth daily.  Marland Kitchen apixaban (ELIQUIS) 5 MG TABS tablet Take one tablet by mouth twice daily  . Ascorbic Acid (VITAMIN C) 1000 MG tablet Take 500 mg by mouth daily.  Marland Kitchen atorvastatin (LIPITOR) 80 MG tablet TAKE (1) TABLET BY MOUTH ONCE DAILY.  . carvedilol (COREG) 12.5 MG tablet TAKE (1) TABLET BY MOUTH TWICE DAILY.  . cholecalciferol (VITAMIN D3) 25 MCG (1000 UT) tablet Take 1,000 Units by mouth daily.  . COMBIGAN 0.2-0.5 % ophthalmic solution Place 1 drop into the left eye 2 (two) times daily.   . CONTOUR NEXT TEST test strip USE 1 STRIP TO CHECK GLUCOSE THREE TIMES DAILY  . dutasteride (AVODART) 0.5 MG capsule TAKE ONE CAPSULE BY MOUTH ONCE DAILY.  Marland Kitchen Emollient (CERAVE) CREA Apply 1 application topically daily as needed (dry skin).  . Flaxseed, Linseed, (FLAXSEED OIL PO) Take by mouth daily.  Marland Kitchen guaiFENesin (MUCINEX) 600 MG 12 hr tablet Take 600 mg by mouth 2 (two) times daily.  . Insulin Glargine (LANTUS SOLOSTAR) 100 UNIT/ML Solostar Pen INJECT 22 UNITS SUBCUTANEOUSLY ONCE DAILY AT 10PM  . JANUVIA 100 MG tablet TAKE (1) TABLET BY MOUTH ONCE DAILY.  . metFORMIN (GLUCOPHAGE) 1000 MG tablet TAKE (1) TABLET BY MOUTH TWICE A DAY WITH A MEAL.  . Multiple Vitamin (MULTIVITAMIN) tablet Take 1 tablet by mouth daily.  . nitroGLYCERIN (NITROSTAT) 0.4 MG SL tablet PLACE 1 TAB UNDER TONGUE EVERY 5 MIN IF NEEDED FOR CHEST PAIN. MAY USE 3 TIMES.NO RELIEF CALL 911.  . pantoprazole (PROTONIX) 40 MG tablet TAKE ONE TABLET BY MOUTH ONCE DAILY.   . Polyvinyl Alcohol-Povidone (REFRESH OP) Place 1 drop into the left eye 2 (two) times daily.  . psyllium (METAMUCIL) 58.6 % powder Take 1 packet by mouth daily.   . quinapril (ACCUPRIL) 5 MG tablet Take 5 mg by mouth at bedtime.  . tamsulosin (FLOMAX) 0.4 MG CAPS capsule TAKE (1) CAPSULE BY MOUTH ONCE DAILY.  Marland Kitchen torsemide (DEMADEX) 20 MG tablet TAKE (1) TABLET BY MOUTH EACH MORNING.  . Turmeric 500 MG CAPS Take 500 mg by mouth daily.             Past Medical History:  Diagnosis Date  . Anginal pain ([place])   . Arthritis    Bilateral knee  . Coronary artery disease   . Diabetes mellitus    insulin dependent   . DVT, lower extremity, distal, acute ([place]) 09/09/2018  . Hyperlipidemia   . Hypertension   .  Progressive angina ([place]) 03/2015   3.0x12 mm Resolute DES to pLAD, Angiosculpt scoring balloon to dLAD  . Stroke [facility])           Objective:        02/01/2019          201  09/13/18 206 lb 6.4 oz (93.6 kg)  09/09/18 215 lb (97.5 kg)  09/09/18 213 lb (96.6 kg)    Pleasant amb wm nad  Vital signs reviewed - Note on arrival 02 sats  97% on RA    amb wm nad   HEENT: nl dentition, turbinates bilaterally, and oropharynx. Nl external ear canals without cough reflex   NECK :  without JVD/Nodes/TM/ nl carotid upstrokes bilaterally   LUNGS: no acc muscle use,  Nl contour chest which is clear to A and P bilaterally without cough on insp or exp maneuvers   CV:  RRR  no s3 or murmur or increase in P2, and no edema   ABD:  soft and nontender with nl inspiratory excursion in the supine position. No bruits or organomegaly appreciated, bowel sounds nl  MS:  Nl gait/ ext warm without deformities, calf tenderness, cyanosis or clubbing No obvious joint restrictions   SKIN: warm and dry without lesions    NEURO:  alert, approp, nl sensorium with  no motor or cerebellar deficits apparent.    CXR PA and Lateral:   02/01/2019 :    I personally reviewed images and agree with radiology  impression as follows:   Decreased bibasilar pleural effusions and atelectasis.  Stable LEFT upper lobe scarring.            Assessment

## 2019-02-01 NOTE — Progress Notes (Signed)
mychart result note sent

## 2019-02-01 NOTE — Patient Instructions (Signed)
Stop accupril and start micardis 40 mg daily - if too strong break in half, if not strong enough take 2 daily     If you are satisfied with your treatment plan,  let your doctor know and he/she can either refill your medications or you can return here when your prescription runs out.     If in any way you are not 100% satisfied,  please tell us.  If 100% better, tell your friends!  Pulmonary follow up is as needed

## 2019-02-02 ENCOUNTER — Encounter: Payer: Self-pay | Admitting: Internal Medicine

## 2019-02-02 NOTE — Assessment & Plan Note (Addendum)
Try off acei 10/24/2018 > resolved 12/22/2018  Continue off  - back on ACEi  And coughing again 02/01/2019 > rec change to arb permanently   If recurs off acei f/u here with all meds in hand using a trust but verify approach to confirm accurate Medication  Reconciliation The principal here is that until we are certain that the  patients are doing what we've asked, it makes no sense to ask them to do more.

## 2019-02-02 NOTE — Assessment & Plan Note (Signed)
Resolved 02/01/2019 p rx for dvt c/w PE   Since clinically and radiographically resolved on DOAC this is all c/w dvt/pe with paraembolic effusions and rx now per PCP with f/u here prn.

## 2019-02-02 NOTE — Assessment & Plan Note (Signed)
D/c accupril 02/01/2019 due to throat clearing   ACE inhibitors are problematic in  pts with airway complaints because  even experienced pulmonologists can't always distinguish ace effects from copd/asthma.  By themselves they don't actually cause a problem, much like oxygen can't by itself start a fire, but they certainly serve as a powerful catalyst or enhancer for any "fire"  or inflammatory process in the upper airway, be it caused by an ET  tube or more commonly reflux (especially in the obese or pts with known GERD or who are on biphoshonates).    In the era of ARB near equivalency until we have a better handle on the the cause  of the airway problem, it just makes sense to avoid ACEI  entirely in the short run and then decide later, having established a level of airway control using a reasonable limited regimen, whether to add back ace but even then being very careful to observe the pt for worsening airway control and number of meds used/ needed to control symptoms.    He is clearing his throat freq during the day using various cough meds to control including containing narcotics so strongly rec change to ARB at this point.  rec micardis 40 mg daily > f/u with PCP for refills if improves, here if doesn't

## 2019-02-02 NOTE — Assessment & Plan Note (Signed)
Onset July 2019 while on baby asa then much worse since the first of 2020  Echo 09/03/2018  1. The left ventricle has normal systolic function of 47-82%. The cavity size was normal. There is no increased left ventricular wall thickness. Echo evidence of impaired diastolic relaxation Normal left ventricular filling pressures.  2. The right ventricle has normal systolic function. The cavity was normal. There is no increase in right ventricular wall thickness.  3. Left atrial size was mildly dilated.  4. The mitral valve is normal in structure. No evidence of mitral valve stenosis.  5. The tricuspid valve is normal in structure.  6. The aortic valve is tricuspid.  7. The aortic root is normal in size and structure 09/03/18 Venous dopplers pos on R with L > R effusions and unable to lie flat for CTa with d dimer 4.68 c/w PE clinically though never proven - 09/13/2018 feeling better on DOAC /demadex   I had an extended final summary discussion with the patient reviewing all relevant studies completed to date and  lasting 15 to 20 minutes of a 25 minute visit on the following issues:   Very high clinical suspicion that the doe and effusions were caused by PE, no further /wu needed and pulmonary f/u is prn   Each maintenance medication was reviewed in detail including most importantly the difference between maintenance and as needed and under what circumstances the prns are to be used.  Please see AVS for specific  Instructions which are unique to this visit and I personally typed out  which were reviewed in detail in writing with the patient and a copy provided.    Pt informed of the seriousness of COVID 19 infection as a direct risk to their health  and safey and to those of their loved ones and should continue to wear facemask in public and minimize exposure to public locations but especially avoid any area or activity where non-close contacts are not observing distancing or wearing an appropriate face  mask.

## 2019-02-07 ENCOUNTER — Ambulatory Visit: Payer: Medicare Other | Admitting: Family Medicine

## 2019-02-12 ENCOUNTER — Emergency Department (HOSPITAL_COMMUNITY)
Admission: EM | Admit: 2019-02-12 | Discharge: 2019-02-12 | Disposition: A | Discharge status: Home / Self Care | Payer: Medicare Other | Attending: Emergency Medicine | Admitting: Emergency Medicine

## 2019-02-12 ENCOUNTER — Other Ambulatory Visit: Payer: Self-pay

## 2019-02-12 ENCOUNTER — Emergency Department (HOSPITAL_COMMUNITY): Payer: Medicare Other

## 2019-02-12 ENCOUNTER — Encounter (HOSPITAL_COMMUNITY): Payer: Self-pay | Admitting: *Deleted

## 2019-02-12 DIAGNOSIS — S01411A Laceration without foreign body of right cheek and temporomandibular area, initial encounter: Secondary | ICD-10-CM | POA: Insufficient documentation

## 2019-02-12 DIAGNOSIS — Y999 Unspecified external cause status: Secondary | ICD-10-CM | POA: Insufficient documentation

## 2019-02-12 DIAGNOSIS — S0501XA Injury of conjunctiva and corneal abrasion without foreign body, right eye, initial encounter: Secondary | ICD-10-CM | POA: Diagnosis not present

## 2019-02-12 DIAGNOSIS — M79645 Pain in left finger(s): Secondary | ICD-10-CM | POA: Diagnosis not present

## 2019-02-12 DIAGNOSIS — I447 Left bundle-branch block, unspecified: Secondary | ICD-10-CM | POA: Diagnosis not present

## 2019-02-12 DIAGNOSIS — S0990XA Unspecified injury of head, initial encounter: Secondary | ICD-10-CM | POA: Diagnosis present

## 2019-02-12 DIAGNOSIS — I10 Essential (primary) hypertension: Secondary | ICD-10-CM | POA: Diagnosis not present

## 2019-02-12 DIAGNOSIS — S0003XA Contusion of scalp, initial encounter: Secondary | ICD-10-CM | POA: Diagnosis not present

## 2019-02-12 DIAGNOSIS — S0181XA Laceration without foreign body of other part of head, initial encounter: Secondary | ICD-10-CM | POA: Diagnosis not present

## 2019-02-12 DIAGNOSIS — Z79899 Other long term (current) drug therapy: Secondary | ICD-10-CM | POA: Diagnosis not present

## 2019-02-12 DIAGNOSIS — E1140 Type 2 diabetes mellitus with diabetic neuropathy, unspecified: Secondary | ICD-10-CM | POA: Diagnosis not present

## 2019-02-12 DIAGNOSIS — Z7901 Long term (current) use of anticoagulants: Secondary | ICD-10-CM | POA: Insufficient documentation

## 2019-02-12 DIAGNOSIS — S6992XA Unspecified injury of left wrist, hand and finger(s), initial encounter: Secondary | ICD-10-CM | POA: Diagnosis not present

## 2019-02-12 DIAGNOSIS — Z794 Long term (current) use of insulin: Secondary | ICD-10-CM | POA: Diagnosis not present

## 2019-02-12 DIAGNOSIS — W108XXA Fall (on) (from) other stairs and steps, initial encounter: Secondary | ICD-10-CM | POA: Insufficient documentation

## 2019-02-12 DIAGNOSIS — Y9389 Activity, other specified: Secondary | ICD-10-CM | POA: Diagnosis not present

## 2019-02-12 DIAGNOSIS — S199XXA Unspecified injury of neck, initial encounter: Secondary | ICD-10-CM | POA: Diagnosis not present

## 2019-02-12 DIAGNOSIS — Z23 Encounter for immunization: Secondary | ICD-10-CM | POA: Diagnosis not present

## 2019-02-12 DIAGNOSIS — S299XXA Unspecified injury of thorax, initial encounter: Secondary | ICD-10-CM | POA: Insufficient documentation

## 2019-02-12 DIAGNOSIS — Y9289 Other specified places as the place of occurrence of the external cause: Secondary | ICD-10-CM | POA: Diagnosis not present

## 2019-02-12 DIAGNOSIS — S4991XA Unspecified injury of right shoulder and upper arm, initial encounter: Secondary | ICD-10-CM | POA: Diagnosis not present

## 2019-02-12 DIAGNOSIS — W19XXXA Unspecified fall, initial encounter: Secondary | ICD-10-CM

## 2019-02-12 DIAGNOSIS — S3991XA Unspecified injury of abdomen, initial encounter: Secondary | ICD-10-CM | POA: Insufficient documentation

## 2019-02-12 DIAGNOSIS — M25511 Pain in right shoulder: Secondary | ICD-10-CM | POA: Diagnosis not present

## 2019-02-12 DIAGNOSIS — S51011A Laceration without foreign body of right elbow, initial encounter: Secondary | ICD-10-CM | POA: Diagnosis not present

## 2019-02-12 LAB — CBC WITH DIFFERENTIAL/PLATELET
Abs Immature Granulocytes: 0.04 10*3/uL (ref 0.00–0.07)
Basophils Absolute: 0.1 10*3/uL (ref 0.0–0.1)
Basophils Relative: 1 %
Eosinophils Absolute: 0.6 10*3/uL — ABNORMAL HIGH (ref 0.0–0.5)
Eosinophils Relative: 6 %
HCT: 37.1 % — ABNORMAL LOW (ref 39.0–52.0)
Hemoglobin: 12 g/dL — ABNORMAL LOW (ref 13.0–17.0)
Immature Granulocytes: 0 %
Lymphocytes Relative: 7 %
Lymphs Abs: 0.7 10*3/uL (ref 0.7–4.0)
MCH: 29.7 pg (ref 26.0–34.0)
MCHC: 32.3 g/dL (ref 30.0–36.0)
MCV: 91.8 fL (ref 80.0–100.0)
Monocytes Absolute: 0.8 10*3/uL (ref 0.1–1.0)
Monocytes Relative: 8 %
Neutro Abs: 7.4 10*3/uL (ref 1.7–7.7)
Neutrophils Relative %: 78 %
Platelets: 227 10*3/uL (ref 150–400)
RBC: 4.04 MIL/uL — ABNORMAL LOW (ref 4.22–5.81)
RDW: 13.8 % (ref 11.5–15.5)
WBC: 9.5 10*3/uL (ref 4.0–10.5)
nRBC: 0 % (ref 0.0–0.2)

## 2019-02-12 LAB — BASIC METABOLIC PANEL
Anion gap: 11 (ref 5–15)
BUN: 26 mg/dL — ABNORMAL HIGH (ref 8–23)
CO2: 22 mmol/L (ref 22–32)
Calcium: 9.3 mg/dL (ref 8.9–10.3)
Chloride: 104 mmol/L (ref 98–111)
Creatinine, Ser: 1.06 mg/dL (ref 0.61–1.24)
GFR calc Af Amer: >60 mL/min (ref >60)
GFR calc non Af Amer: >60 mL/min (ref >60)
Glucose, Bld: 213 mg/dL — ABNORMAL HIGH (ref 70–99)
Potassium: 4.5 mmol/L (ref 3.5–5.1)
Sodium: 137 mmol/L (ref 135–145)

## 2019-02-12 MED ORDER — FLUORESCEIN SODIUM 1 MG OP STRP
1.0000 | ORAL_STRIP | Freq: Once | OPHTHALMIC | Status: AC
  Administered 2019-02-12: 1 via OPHTHALMIC
  Filled 2019-02-12: qty 1

## 2019-02-12 MED ORDER — IOHEXOL 300 MG/ML  SOLN
100.0000 mL | Freq: Once | INTRAMUSCULAR | Status: AC | PRN
  Administered 2019-02-12: 100 mL via INTRAVENOUS

## 2019-02-12 MED ORDER — TETRACAINE HCL 0.5 % OP SOLN
2.0000 [drp] | Freq: Once | OPHTHALMIC | Status: AC
  Administered 2019-02-12: 07:00:00 2 [drp] via OPHTHALMIC
  Filled 2019-02-12: qty 4

## 2019-02-12 MED ORDER — POLYMYXIN B-TRIMETHOPRIM 10000-0.1 UNIT/ML-% OP SOLN: 1.0000 [drp] | Freq: Four times a day (QID) | OPHTHALMIC | 0 refills | Status: AC

## 2019-02-12 MED ORDER — BACITRACIN ZINC 500 UNIT/GM EX OINT: 1.0000 "application " | TOPICAL_OINTMENT | Freq: Two times a day (BID) | CUTANEOUS | 0 refills | Status: DC

## 2019-02-12 MED ORDER — TETANUS-DIPHTH-ACELL PERTUSSIS 5-2.5-18.5 LF-MCG/0.5 IM SUSP
0.5000 mL | Freq: Once | INTRAMUSCULAR | Status: AC
  Administered 2019-02-12: 0.5 mL via INTRAMUSCULAR
  Filled 2019-02-12: qty 0.5

## 2019-02-12 MED ORDER — POVIDONE-IODINE 10 % EX SOLN
CUTANEOUS | Status: AC
  Administered 2019-02-12: 1
  Filled 2019-02-12: qty 15

## 2019-02-12 MED ORDER — LIDOCAINE-EPINEPHRINE (PF) 2 %-1:200000 IJ SOLN
20.0000 mL | Freq: Once | INTRAMUSCULAR | Status: AC
  Administered 2019-02-12: 20 mL
  Filled 2019-02-12: qty 20

## 2019-02-12 NOTE — ED Provider Notes (Signed)
[facility] EMERGENCY DEPARTMENT Provider Note   CSN: [id number] Arrival date & time: 02/12/19  0501     History   Chief Complaint Chief Complaint  Patient presents with   Fall    HPI Jerry Burns is a 85 y.o. male.     Patient with history of CAD, diabetes, DVT on Eliquis, previous stroke, presenting after fall down flight of steps this morning.  States he turned the wrong way while in the dark and fell down a flight of "16 wooden steps".  Denies losing consciousness.  He hit his head, right arm, left hand and face.  Complains of pain and swelling to his right elbow, cheek, forehead and left hand.  Denies any neck or back pain.  Denies any chest pain or shortness of breath.  No abdominal pain.  No focal weakness, numbness or tingling.  Denies any preceding dizziness or lightheadedness.  Believes his tetanus shot is up-to-date.  The history is provided by the patient.  Fall Associated symptoms include headaches. Pertinent negatives include no chest pain, no abdominal pain and no shortness of breath.    Past Medical History:  Diagnosis Date   Anginal pain ([place])    Arthritis    Bilateral knee   Coronary artery disease    Diabetes mellitus    insulin dependent    DVT, lower extremity, distal, acute ([place]) 09/09/2018   Hyperlipidemia    Hypertension    Progressive angina ([place]) 03/2015   a. s/p DES to proximal LAD and balloon angioplasty to distal LAD in 2016 b. patent stent by repeat cath in 01/2017 with mild residual CAD)   Stroke [facility])     Patient Active Problem List   Diagnosis Date Noted   Chronic cough 10/26/2018   Pleural effusion, bilateral  L > R first detected 09/02/2018 in setting of dx of dvt 09/14/2018   DOE (dyspnea on exertion) 09/13/2018   DVT, lower extremity, distal, acute ([place]) 56/31/4970   Acute diastolic CHF (congestive heart failure) ([place]) 09/03/2018   Elevated d-dimer    Leg swelling    Peripheral edema    Normochromic  normocytic anemia 04/11/2018   Heme positive stool 04/11/2018   Long term current use of antithrombotics/antiplatelets 02/06/2016   Exertional angina ([place]) 04/12/2015   History of colonic polyps 11/21/2014   ASCVD (arteriosclerotic cardiovascular disease) 09/03/2014   Unstable angina ([place]) 08/22/2014   Type 2 diabetes mellitus with diabetic neuropathy, with long-term current use of insulin ([place]) 08/08/2014   Chest pain on exertion 07/23/2014   Rotator cuff syndrome of right shoulder 12/16/2011   Shoulder pain, right 12/16/2011   HYPERKALEMIA 01/31/2009   KNEE PAIN, LEFT 07/31/2008   DIABETIC FOOT ULCER, TOE 12/23/2007   BPH (benign prostatic hyperplasia) 01/26/2007   URINARY TRACT INFECTION 01/12/2007   URINARY RETENTION 01/12/2007   ERECTILE DYSFUNCTION 11/01/2006   Hyperlipidemia 08/04/2006   Essential hypertension 08/04/2006   OSTEOARTHRITIS 08/04/2006    Past Surgical History:  Procedure Laterality Date   APPENDECTOMY     CARDIAC CATHETERIZATION N/A 04/12/2015   Procedure: Left Heart Cath and Coronary Angiography;  Surgeon: Sherren Mocha, MD; pLAD 75>0% w/ 3.0x12 mm Resolute DES, dLAD 80>0% w/ Angiosculpt scoring balloon, CFX luminal irreg, RCA mild dz, EF 55-65%    COLONOSCOPY  2011   RMR: 1. Normal rectum 2. Left sided diverticula , 2 ascending colon polyps, status post snare polypectomy. Remainder of colonic mucosa appeared unremarkable.    COLONOSCOPY N/A 05/13/2016   Procedure: COLONOSCOPY;  Surgeon: Daneil Dolin, MD;  Location[facility] ENDO SUITE;  Service: Endoscopy;  Laterality: N/A;  7:30 AM   COLONOSCOPY N/A 05/18/2018   Procedure: COLONOSCOPY;  Surgeon: Daneil Dolin, MD;  Location: [facility] ENDO SUITE;  Service: Endoscopy;  Laterality: N/A;  1:15pm   CORONARY ANGIOGRAPHY N/A 02/04/2017   Procedure: CORONARY ANGIOGRAPHY (CATH LAB);  Surgeon: Troy Sine, MD;  Location: [place] CV LAB;  Service: Cardiovascular;  Laterality: N/A;   CORONARY  ANGIOPLASTY WITH STENT PLACEMENT  08/22/2014   PTCA/DES X mLAD with 2.5 x 16 mm Promus Premier DES by Dr Julianne Handler   ESOPHAGOGASTRODUODENOSCOPY N/A 05/18/2018   Procedure: ESOPHAGOGASTRODUODENOSCOPY (EGD);  Surgeon: Daneil Dolin, MD;  Location: [facility];  Service: Endoscopy;  Laterality: N/A;   FOOT SURGERY Right    LEFT HEART CATHETERIZATION WITH CORONARY ANGIOGRAM N/A 08/22/2014   Procedure: LEFT HEART CATHETERIZATION WITH CORONARY ANGIOGRAM;  Surgeon: Burnell Blanks, MD;  Location: [facility] CATH LAB;  Service: Cardiovascular;  Laterality: N/A;   PERCUTANEOUS CORONARY STENT INTERVENTION (PCI-S)  08/22/2014   Procedure: PERCUTANEOUS CORONARY STENT INTERVENTION (PCI-S);  Surgeon: Burnell Blanks, MD;    SHOULDER SURGERY     TONSILLECTOMY          Home Medications    Prior to Admission medications   Medication Sig Start Date End Date Taking? Authorizing Provider  amLODipine (NORVASC) 10 MG tablet Take 1 tablet (10 mg total) by mouth daily. 12/27/18  Yes Kathyrn Drown, MD  apixaban (ELIQUIS) 5 MG TABS tablet Take one tablet by mouth twice daily 09/15/18  Yes Luking, Elayne Snare, MD  Ascorbic Acid (VITAMIN C) 1000 MG tablet Take 500 mg by mouth daily.   Yes [provider]  atorvastatin (LIPITOR) 80 MG tablet TAKE (1) TABLET BY MOUTH ONCE DAILY. 01/17/19  Yes Luking, Elayne Snare, MD  carvedilol (COREG) 12.5 MG tablet TAKE (1) TABLET BY MOUTH TWICE DAILY. 03/14/18  Yes Herminio Commons, MD  cholecalciferol (VITAMIN D3) 25 MCG (1000 UT) tablet Take 1,000 Units by mouth daily.   Yes [provider]  COMBIGAN 0.2-0.5 % ophthalmic solution Place 1 drop into the left eye 2 (two) times daily.  09/06/15  Yes [provider]  CONTOUR NEXT TEST test strip USE 1 STRIP TO CHECK GLUCOSE THREE TIMES DAILY 11/02/18  Yes [provider]  dutasteride (AVODART) 0.5 MG capsule TAKE ONE CAPSULE BY MOUTH ONCE DAILY. 08/22/18  Yes Luking, Elayne Snare, MD  Emollient  (CERAVE) CREA Apply 1 application topically daily as needed (dry skin).   Yes [provider]  Flaxseed, Linseed, (FLAXSEED OIL PO) Take by mouth daily.   Yes [provider]  guaiFENesin (MUCINEX) 600 MG 12 hr tablet Take 600 mg by mouth 2 (two) times daily.   Yes [provider]  Insulin Glargine (LANTUS SOLOSTAR) 100 UNIT/ML Solostar Pen INJECT 22 UNITS SUBCUTANEOUSLY ONCE DAILY AT 10PM 01/17/19  Yes Luking, Scott A, MD  JANUVIA 100 MG tablet TAKE (1) TABLET BY MOUTH ONCE DAILY. 10/03/18  Yes Luking, Elayne Snare, MD  metFORMIN (GLUCOPHAGE) 1000 MG tablet TAKE (1) TABLET BY MOUTH TWICE A DAY WITH A MEAL. 05/30/18  Yes Kathyrn Drown, MD  Multiple Vitamin (MULTIVITAMIN) tablet Take 1 tablet by mouth daily.   Yes [provider]  nitroGLYCERIN (NITROSTAT) 0.4 MG SL tablet PLACE 1 TAB UNDER TONGUE EVERY 5 MIN IF NEEDED FOR CHEST PAIN. MAY USE 3 TIMES.NO RELIEF CALL 911. 01/31/19  Yes Strader, Fransisco Hertz,  PA-C  pantoprazole (PROTONIX) 40 MG tablet TAKE ONE TABLET BY MOUTH ONCE DAILY. 10/24/18  Yes Annitta Needs, NP  Polyvinyl Alcohol-Povidone (REFRESH OP) Place 1 drop into the left eye 2 (two) times daily.   Yes [provider]  psyllium (METAMUCIL) 58.6 % powder Take 1 packet by mouth daily.    Yes [provider]  tamsulosin (FLOMAX) 0.4 MG CAPS capsule TAKE (1) CAPSULE BY MOUTH ONCE DAILY. 10/24/18  Yes Kathyrn Drown, MD  telmisartan (MICARDIS) 40 MG tablet Take 1 tablet (40 mg total) by mouth daily. 02/01/19  Yes Tanda Rockers, MD  torsemide (DEMADEX) 20 MG tablet TAKE (1) TABLET BY MOUTH EACH MORNING. 01/12/19  Yes Luking, Elayne Snare, MD  Turmeric 500 MG CAPS Take 500 mg by mouth daily.   Yes [provider]    Family History Family History  Problem Relation Age of Onset   Diabetes Other    Diabetes Brother    Colon cancer Maternal Uncle    Skin cancer Mother    Diabetes Father    Hypertension Sister    Skin cancer Sister      Social History Social History   Tobacco Use   Smoking status: Former Smoker    Packs/day: 1.00    Years: 20.00    Pack years: 20.00    Types: Cigarettes, Cigars    Start date: 08/10/1955    Quit date: 07/28/2007    Years since quitting: 11.5   Smokeless tobacco: Never Used  Substance Use Topics   Alcohol use: Not Currently    Alcohol/week: 0.0 standard drinks    Comment: Daily about 1 glass of wine   Drug use: No     Allergies   Adhesive [tape], Sulfa antibiotics, and Latex   Review of Systems Review of Systems  Constitutional: Negative for activity change, appetite change and fever.  HENT: Negative for congestion and rhinorrhea.   Eyes: Negative for visual disturbance.  Respiratory: Negative for cough, chest tightness and shortness of breath.   Cardiovascular: Negative for chest pain.  Gastrointestinal: Negative for abdominal pain.  Genitourinary: Negative for dysuria and hematuria.  Musculoskeletal: Positive for arthralgias and myalgias. Negative for back pain and neck pain.  Skin: Positive for wound.  Neurological: Positive for headaches. Negative for dizziness, weakness and light-headedness.   all other systems are negative except as noted in the HPI and PMH.     Physical Exam Updated Vital Signs BP (!) 165/62 (BP Location: Left Arm)    Pulse 77    Temp 97.7 F (36.5 C) (Oral)    Resp 17    SpO2 99%   Physical Exam Vitals signs and nursing note reviewed.  Constitutional:      General: He is not in acute distress.    Appearance: He is well-developed.  HENT:     Head: Normocephalic and atraumatic.     Comments: Abrasion hematoma to right forehead  3 cm U-shaped laceration to right zygoma with arterial bleeding. Symmetric smile.   No facial asymmetry, cranial nerves II to XII intact No septal hematoma or hemotympanum    Right Ear: Tympanic membrane normal.     Left Ear: Tympanic membrane normal.     Mouth/Throat:     Pharynx: No oropharyngeal  exudate.  Eyes:     General: Lids are normal.     Conjunctiva/sclera:     Right eye: Right conjunctiva is injected. Hemorrhage present.     Pupils: Pupils are equal, round, and  reactive to light.     Right eye: Corneal abrasion and fluorescein uptake present.     Slit lamp exam:    Right eye: No hyphema or hypopyon.      Comments: Right subconjunctival hemorrhage, extraocular movements are intact  Neck:     Musculoskeletal: Normal range of motion and neck supple.     Comments: No meningismus. Cardiovascular:     Rate and Rhythm: Normal rate and regular rhythm.     Heart sounds: Normal heart sounds. No murmur.  Pulmonary:     Effort: Pulmonary effort is normal. No respiratory distress.     Breath sounds: Normal breath sounds.  Abdominal:     Palpations: Abdomen is soft.     Tenderness: There is no abdominal tenderness. There is no guarding or rebound.  Musculoskeletal: Normal range of motion.        General: No tenderness.     Comments: No T or L-spine tenderness  Full range of motion of hips without pain  Skin tear right elbow without bony tenderness  Tenderness to first metacarpal of the left hand without deformity.  Skin:    General: Skin is warm.     Capillary Refill: Capillary refill takes less than 2 seconds.  Neurological:     General: No focal deficit present.     Mental Status: He is alert and oriented to person, place, and time. Mental status is at baseline.     Cranial Nerves: No cranial nerve deficit.     Motor: No abnormal muscle tone.     Coordination: Coordination normal.     Comments: No ataxia on finger to nose bilaterally. No pronator drift. 5/5 strength throughout. CN 2-12 intact.Equal grip strength. Sensation intact.   Psychiatric:        Behavior: Behavior normal.      ED Treatments / Results  Labs (all labs ordered are listed, but only abnormal results are displayed) Labs Reviewed  CBC WITH DIFFERENTIAL/PLATELET - Abnormal; Notable for the  following components:      Result Value   RBC 4.04 (*)    Hemoglobin 12.0 (*)    HCT 37.1 (*)    Eosinophils Absolute 0.6 (*)    All other components within normal limits  BASIC METABOLIC PANEL - Abnormal; Notable for the following components:   Glucose, Bld 213 (*)    BUN 26 (*)    All other components within normal limits    EKG EKG Interpretation  Date/Time:  Sunday February 12 2019 05:52:52 EDT Ventricular Rate:  77 PR Interval:    QRS Duration: 121 QT Interval:  395 QTC Calculation: 447 R Axis:   -60 Text Interpretation:  Sinus rhythm Left bundle branch block No significant change was found Confirmed by Ezequiel Essex [id number]) on 02/12/2019 7:09:34 AM   Radiology Dg Chest 2 View  Result Date: 02/12/2019 CLINICAL DATA:  Initial evaluation for acute trauma, fall. EXAM: CHEST - 2 VIEW COMPARISON:  Prior radiograph from 02/01/2019. FINDINGS: Exaggeration of the cardiac silhouette related to AP technique and lordotic angulation. Transverse heart size likely similar to previous. Mediastinal silhouette within normal limits. Lungs normally inflated. Persistent blunting of the left costophrenic angle suggestive of a small left pleural effusion. Mild streaky bibasilar subsegmental atelectasis. No consolidative opacity. No pulmonary edema. No visible pneumothorax. No acute osseous finding. Degenerative changes about the shoulders bilaterally. IMPRESSION: 1. Persistent blunting of the left costophrenic angle, suggestive of a small left pleural effusion. 2. Mild streaky bibasilar subsegmental atelectasis. 3.  No other radiographic evidence for active cardiopulmonary disease or acute traumatic injury. Electronically Signed   By: Jeannine Boga M.D.   On: 02/12/2019 07:27    Procedures .Marland KitchenLaceration Repair  Date/Time: 02/12/2019 7:12 AM Performed by: Ezequiel Essex, MD Authorized by: Ezequiel Essex, MD   Consent:    Consent obtained:  Verbal   Consent given by:  Patient   Risks  discussed:  Infection, need for additional repair, poor wound healing, poor cosmetic result, pain, nerve damage, vascular damage, tendon damage and retained foreign body   Alternatives discussed:  No treatment Anesthesia (see MAR for exact dosages):    Anesthesia method:  Local infiltration   Local anesthetic:  Lidocaine 2% WITH epi Laceration details:    Location:  Face   Face location:  R cheek   Length (cm):  4 Repair type:    Repair type:  Complex Pre-procedure details:    Preparation:  Patient was prepped and draped in usual sterile fashion and imaging obtained to evaluate for foreign bodies Exploration:    Hemostasis achieved with:  Epinephrine and direct pressure   Wound exploration: wound explored through full range of motion and entire depth of wound probed and visualized     Wound extent: fascia violated and vascular damage     Wound extent: no foreign bodies/material noted   Treatment:    Area cleansed with:  Betadine   Amount of cleaning:  Standard   Irrigation solution:  Sterile saline   Irrigation volume:  1000   Irrigation method:  Pressure wash   Visualized foreign bodies/material removed: no     Debridement:  Minimal   Scar revision: no   Subcutaneous repair:    Suture size:  6-0   Suture material:  Vicryl   Suture technique:  Figure eight   Number of sutures:  3 Skin repair:    Repair method:  Sutures   Suture size:  5-0   Suture material:  Nylon   Suture technique:  Simple interrupted   Number of sutures:  8 Approximation:    Approximation:  Close Post-procedure details:    Dressing:  Antibiotic ointment and adhesive bandage   Patient tolerance of procedure:  Tolerated well, no immediate complications   (including critical care time)  Medications Ordered in ED Medications  lidocaine-EPINEPHrine (XYLOCAINE W/EPI) 2 %-1:200000 (PF) injection 20 mL (has no administration in time range)     Initial Impression / Assessment and Plan / ED Course  I  have reviewed the triage vital signs and the nursing notes.  Pertinent labs & imaging results that were available during my care of the patient were reviewed by me and considered in my medical decision making (see chart for details).       Fell down flight of steps with facial injury and right elbow injury.  GCS 15.  ABCs are intact  Patient does have arterial bleeding from his right cheek laceration.  He is not certain when his last tetanus shot was.  Will update.  Laceration repaired as above.  Patient awaiting traumatic imaging including head, C-spine, face, chest, abdomen pelvis.  He is also awaiting plain films of his shoulder and elbow.  As well as left hand.  He will need suture removal in 1 week..  We transferred to Dr. Sabra Heck at shift change to follow-up for traumatic imaging.  Final Clinical Impressions(s) / ED Diagnoses   Final diagnoses:  Fall, initial encounter  Facial laceration, initial encounter    ED Discharge Orders  None       Ezequiel Essex, MD 02/12/19 [phone]

## 2019-02-12 NOTE — ED Notes (Signed)
Pt requests not to call and bother his wife at this time.

## 2019-02-12 NOTE — ED Provider Notes (Signed)
The patient was reexamined, he is awake and alert, he was made aware of all of his findings.  He follows up with Dr. Wolfgang Phoenix here locally.  He is stable for discharge and requesting discharge without pain medications.  He will have his tetanus updated   Noemi Chapel, MD 02/12/19 [phone]

## 2019-02-12 NOTE — ED Triage Notes (Signed)
Pt states that he tripped on the stairs this am, fell down 16 steps, c/o laceration noted to right cheek bone, abrasion with swelling noted to forehead, laceration noted to right elbow, pt denies any LOC,

## 2019-02-12 NOTE — Discharge Instructions (Addendum)
Follow-up with your primary doctor.  Your sutures should be removed in 1 week.  Use the topical antibiotic as prescribed as well as the eyedrops for a scratch on your cornea.  Return to the ED with new or worsening symptoms.  You do not have any fracture of your skull, face or neck - or ribs / spine.    Tylenol for pain, ice for swelling

## 2019-02-13 ENCOUNTER — Other Ambulatory Visit: Payer: Self-pay | Admitting: Family Medicine

## 2019-02-13 ENCOUNTER — Other Ambulatory Visit: Payer: Self-pay | Admitting: Cardiovascular Disease

## 2019-02-14 ENCOUNTER — Telehealth: Payer: Self-pay | Admitting: Family Medicine

## 2019-02-14 NOTE — Telephone Encounter (Signed)
Front Patient was seen in the ER and had stitches placed on Sunday It was recommended to remove these in 7 days Use it is scheduled for Monday the 27th which is technically 8 days Please connect with the patient I would like for this patient to follow-up on Friday to have the stitches checked It is possible we may be removing the stitches on Friday if the skin is healing well If they are not quite ready to be removed we will bring him back at the start of the following week

## 2019-02-17 ENCOUNTER — Ambulatory Visit (INDEPENDENT_AMBULATORY_CARE_PROVIDER_SITE_OTHER): Payer: Medicare Other | Admitting: Family Medicine

## 2019-02-17 ENCOUNTER — Encounter: Payer: Self-pay | Admitting: Family Medicine

## 2019-02-17 ENCOUNTER — Other Ambulatory Visit: Payer: Self-pay

## 2019-02-17 VITALS — BP 122/72 | Temp 97.9°F | Wt 206.4 lb

## 2019-02-17 DIAGNOSIS — I2510 Atherosclerotic heart disease of native coronary artery without angina pectoris: Secondary | ICD-10-CM

## 2019-02-17 DIAGNOSIS — S0083XD Contusion of other part of head, subsequent encounter: Secondary | ICD-10-CM

## 2019-02-17 DIAGNOSIS — S0181XD Laceration without foreign body of other part of head, subsequent encounter: Secondary | ICD-10-CM

## 2019-02-17 DIAGNOSIS — W108XXD Fall (on) (from) other stairs and steps, subsequent encounter: Secondary | ICD-10-CM

## 2019-02-17 DIAGNOSIS — E119 Type 2 diabetes mellitus without complications: Secondary | ICD-10-CM | POA: Diagnosis not present

## 2019-02-17 NOTE — Progress Notes (Signed)
   Subjective:    Patient ID: Jerry Burns, male    DOB: [birth year], 85 y.o.   MRN: [id number]  HPI  Patient arrives for a follow up after a recent fall. Very nice patient recently had a fall down a set of steps underwent extensive evaluation by the ER.  They felt that he had mainly contusions and bruising but no fractures.  All this record was reviewed with the patient today.  He also had a significant elliptical laceration on his face required 3 deep stitches of 8 surface stitches is actually doing somewhat better now he denies any ongoing problems other than soreness discomforts and some mild headaches no nausea vomiting Review of Systems  Constitutional: Negative for activity change, fatigue and fever.  HENT: Negative for congestion and rhinorrhea.   Respiratory: Negative for cough and shortness of breath.   Cardiovascular: Negative for chest pain and leg swelling.  Gastrointestinal: Negative for abdominal pain, diarrhea, nausea and vomiting.  Genitourinary: Negative for dysuria and hematuria.  Neurological: Negative for weakness and headaches.  Psychiatric/Behavioral: Negative for agitation, behavioral problems and confusion.       Objective:   Physical Exam Vitals signs reviewed.  Constitutional:      General: He is not in acute distress. HENT:     Head: Normocephalic.     Nose: Nose normal.  Eyes:     General:        Right eye: No discharge.        Left eye: No discharge.  Neck:     Trachea: No tracheal deviation.  Cardiovascular:     Rate and Rhythm: Normal rate and regular rhythm.     Heart sounds: Normal heart sounds. No murmur.  Pulmonary:     Effort: Pulmonary effort is normal. No respiratory distress.     Breath sounds: Normal breath sounds. No wheezing.  Lymphadenopathy:     Cervical: No cervical adenopathy.  Skin:    General: Skin is warm.     Findings: No rash.  Neurological:     Mental Status: He is alert.  Psychiatric:        Behavior: Behavior  normal.           Assessment & Plan:  Patient has a laceration on the side of his face it appears to be well-healing It is ready for the sutures to be removed Sutures removed without difficulty Steri-Strips placed Proper care discussed in detail No sign of infection If any progressive troubles nausea vomiting or other problems immediately follow-up Patient will do lab work later in the fall and follow-up for diabetes. Fall prevention was discussed with the patient.

## 2019-02-20 ENCOUNTER — Ambulatory Visit: Payer: Medicare Other | Admitting: Family Medicine

## 2019-03-27 ENCOUNTER — Other Ambulatory Visit: Payer: Self-pay | Admitting: Family Medicine

## 2019-03-28 ENCOUNTER — Other Ambulatory Visit: Payer: Self-pay

## 2019-03-28 ENCOUNTER — Ambulatory Visit (INDEPENDENT_AMBULATORY_CARE_PROVIDER_SITE_OTHER): Payer: Medicare Other | Admitting: Cardiovascular Disease

## 2019-03-28 ENCOUNTER — Other Ambulatory Visit: Payer: Self-pay | Admitting: Family Medicine

## 2019-03-28 ENCOUNTER — Encounter: Payer: Self-pay | Admitting: Cardiovascular Disease

## 2019-03-28 VITALS — BP 122/68 | HR 73 | Temp 97.3°F | Ht 71.5 in | Wt 211.0 lb

## 2019-03-28 DIAGNOSIS — I10 Essential (primary) hypertension: Secondary | ICD-10-CM | POA: Diagnosis not present

## 2019-03-28 DIAGNOSIS — I2510 Atherosclerotic heart disease of native coronary artery without angina pectoris: Secondary | ICD-10-CM

## 2019-03-28 DIAGNOSIS — Z955 Presence of coronary angioplasty implant and graft: Secondary | ICD-10-CM

## 2019-03-28 DIAGNOSIS — I25118 Atherosclerotic heart disease of native coronary artery with other forms of angina pectoris: Secondary | ICD-10-CM | POA: Diagnosis not present

## 2019-03-28 DIAGNOSIS — E785 Hyperlipidemia, unspecified: Secondary | ICD-10-CM

## 2019-03-28 NOTE — Progress Notes (Signed)
SUBJECTIVE: The patient presents for routine follow-up.  He was evaluated in the ED on 02/12/2019 for a fall which happened while he was walking the wrong way while in the dark and fell down a flight of 16 steps.  Past medical history includes coronary artery disease with drug-eluting stent placement to the proximal LAD and balloon angioplasty to the distal LAD in 2016.  Stent was found to be patent by repeat cardiac catheterization in July 2018 with mild residual coronary artery disease.  He is doing well overall and is grateful for the recovery he has made this year.  He denies chest pain, palpitations, shortness of breath, and leg swelling.  He and his wife have 3 children in [place], one in [place], and one in [place].    Review of Systems: As per "subjective", otherwise negative.  Allergies  Allergen Reactions  . Adhesive [Tape]     bandaids cause blisters after leaving on for 24 hours   . Sulfa Antibiotics Other (See Comments)    "was terrible" "eye went crazy"  . Latex Other (See Comments)    blisters    Current Outpatient Medications  Medication Sig Dispense Refill  . amLODipine (NORVASC) 10 MG tablet Take 1 tablet (10 mg total) by mouth daily. 90 tablet 1  . Ascorbic Acid (VITAMIN C) 1000 MG tablet Take 500 mg by mouth daily.    Marland Kitchen atorvastatin (LIPITOR) 80 MG tablet TAKE (1) TABLET BY MOUTH ONCE DAILY. 90 tablet 0  . bacitracin ointment Apply 1 application topically 2 (two) times daily. 120 g 0  . carvedilol (COREG) 12.5 MG tablet TAKE (1) TABLET BY MOUTH TWICE DAILY. 180 tablet 3  . cholecalciferol (VITAMIN D3) 25 MCG (1000 UT) tablet Take 1,000 Units by mouth daily.    . COMBIGAN 0.2-0.5 % ophthalmic solution Place 1 drop into the left eye 2 (two) times daily.   2  . CONTOUR NEXT TEST test strip USE 1 STRIP TO CHECK GLUCOSE THREE TIMES DAILY    . dutasteride (AVODART) 0.5 MG capsule TAKE ONE CAPSULE BY MOUTH ONCE DAILY. 90 capsule 0  . Emollient (CERAVE) CREA  Apply 1 application topically daily as needed (dry skin).    . Flaxseed, Linseed, (FLAXSEED OIL PO) Take by mouth daily.    Marland Kitchen guaiFENesin (MUCINEX) 600 MG 12 hr tablet Take 600 mg by mouth 2 (two) times daily.    . Insulin Glargine (LANTUS SOLOSTAR) 100 UNIT/ML Solostar Pen INJECT 22 UNITS SUBCUTANEOUSLY ONCE DAILY AT 10PM 15 mL 0  . JANUVIA 100 MG tablet TAKE (1) TABLET BY MOUTH ONCE DAILY. 30 tablet 5  . metFORMIN (GLUCOPHAGE) 1000 MG tablet TAKE 1 TABLET BY MOUTH TWICE DAILY WITH A MEAL. 180 tablet 0  . Multiple Vitamin (MULTIVITAMIN) tablet Take 1 tablet by mouth daily.    . nitroGLYCERIN (NITROSTAT) 0.4 MG SL tablet PLACE 1 TAB UNDER TONGUE EVERY 5 MIN IF NEEDED FOR CHEST PAIN. MAY USE 3 TIMES.NO RELIEF CALL 911. 25 tablet 3  . pantoprazole (PROTONIX) 40 MG tablet TAKE ONE TABLET BY MOUTH ONCE DAILY. 30 tablet 5  . Polyvinyl Alcohol-Povidone (REFRESH OP) Place 1 drop into the left eye 2 (two) times daily.    . psyllium (METAMUCIL) 58.6 % powder Take 1 packet by mouth daily.     . tamsulosin (FLOMAX) 0.4 MG CAPS capsule TAKE (1) CAPSULE BY MOUTH ONCE DAILY. 90 capsule 0  . telmisartan (MICARDIS) 40 MG tablet Take 1 tablet (40 mg total) by  mouth daily. 30 tablet 3  . torsemide (DEMADEX) 20 MG tablet TAKE (1) TABLET BY MOUTH EACH MORNING. 30 tablet 5  . Turmeric 500 MG CAPS Take 500 mg by mouth daily.     No current facility-administered medications for this visit.     Past Medical History:  Diagnosis Date  . Anginal pain ([place])   . Arthritis    Bilateral knee  . Coronary artery disease   . Diabetes mellitus    insulin dependent   . DVT, lower extremity, distal, acute ([place]) 09/09/2018  . Hyperlipidemia   . Hypertension   . Progressive angina ([place]) 03/2015   a. s/p DES to proximal LAD and balloon angioplasty to distal LAD in 2016 b. patent stent by repeat cath in 01/2017 with mild residual CAD)  . Stroke [facility])     Past Surgical History:  Procedure Laterality Date  . APPENDECTOMY     . CARDIAC CATHETERIZATION N/A 04/12/2015   Procedure: Left Heart Cath and Coronary Angiography;  Surgeon: Sherren Mocha, MD; pLAD 75>0% w/ 3.0x12 mm Resolute DES, dLAD 80>0% w/ Angiosculpt scoring balloon, CFX luminal irreg, RCA mild dz, EF 55-65%   . COLONOSCOPY  2011   RMR: 1. Normal rectum 2. Left sided diverticula , 2 ascending colon polyps, status post snare polypectomy. Remainder of colonic mucosa appeared unremarkable.   . COLONOSCOPY N/A 05/13/2016   Procedure: COLONOSCOPY;  Surgeon: Daneil Dolin, MD;  [facility];  Service: Endoscopy;  Laterality: N/A;  7:30 AM  . COLONOSCOPY N/A 05/18/2018   Procedure: COLONOSCOPY;  Surgeon: Daneil Dolin, MD;  [facility];  Service: Endoscopy;  Laterality: N/A;  1:15pm  . CORONARY ANGIOGRAPHY N/A 02/04/2017   Procedure: CORONARY ANGIOGRAPHY (CATH LAB);  Surgeon: Troy Sine, MD;  Location: [facility] CV LAB;  Service: Cardiovascular;  Laterality: N/A;  . CORONARY ANGIOPLASTY WITH STENT PLACEMENT  08/22/2014   PTCA/DES X mLAD with 2.5 x 16 mm Promus Premier DES by Dr Julianne Handler  . ESOPHAGOGASTRODUODENOSCOPY N/A 05/18/2018   Procedure: ESOPHAGOGASTRODUODENOSCOPY (EGD);  Surgeon: Daneil Dolin, MD;  [facility];  Service: Endoscopy;  Laterality: N/A;  . FOOT SURGERY Right   . LEFT HEART CATHETERIZATION WITH CORONARY ANGIOGRAM N/A 08/22/2014   Procedure: LEFT HEART CATHETERIZATION WITH CORONARY ANGIOGRAM;  Surgeon: Burnell Blanks, MD;  [facility] CATH LAB;  Service: Cardiovascular;  Laterality: N/A;  . PERCUTANEOUS CORONARY STENT INTERVENTION (PCI-S)  08/22/2014   Procedure: PERCUTANEOUS CORONARY STENT INTERVENTION (PCI-S);  Surgeon: Burnell Blanks, MD;   . SHOULDER SURGERY    . TONSILLECTOMY      Social History   Socioeconomic History  . Marital status: Married    Spouse name: Not on file  . Number of children: Not on file  . Years of education: 37  . Highest education level: Not on  file  Occupational History  . Occupation: retired  Scientific laboratory technician  . Financial resource strain: Not on file  . Food insecurity    Worry: Not on file    Inability: Not on file  . Transportation needs    Medical: Not on file    Non-medical: Not on file  Tobacco Use  . Smoking status: Former Smoker    Packs/day: 1.00    Years: 20.00    Pack years: 20.00    Types: Cigarettes, Cigars    Start date: 08/10/1955    Quit date: 07/28/2007    Years since quitting: 11.6  . Smokeless tobacco:  Never Used  Substance and Sexual Activity  . Alcohol use: Not Currently    Alcohol/week: 0.0 standard drinks    Comment: Daily about 1 glass of wine  . Drug use: No  . Sexual activity: Not Currently  Lifestyle  . Physical activity    Days per week: Not on file    Minutes per session: Not on file  . Stress: Not on file  Relationships  . Social Herbalist on phone: Not on file    Gets together: Not on file    Attends religious service: Not on file    Active member of club or organization: Not on file    Attends meetings of clubs or organizations: Not on file    Relationship status: Not on file  . Intimate partner violence    Fear of current or ex partner: Not on file    Emotionally abused: Not on file    Physically abused: Not on file    Forced sexual activity: Not on file  Other Topics Concern  . Not on file  Social History Narrative  . Not on file     Vitals:   03/28/19 0802  BP: 122/68  Pulse: 73  Temp: (!) 97.3 F (36.3 C)  SpO2: 96%  Weight: 211 lb (95.7 kg)  Height: 5' 11.5" (1.816 m)    Wt Readings from Last 3 Encounters:  03/28/19 211 lb (95.7 kg)  02/17/19 206 lb 6.4 oz (93.6 kg)  02/01/19 201 lb (91.2 kg)     PHYSICAL EXAM General: NAD HEENT: Normal. Neck: No JVD, no thyromegaly. Lungs: Clear to auscultation bilaterally with normal respiratory effort. CV: Regular rate and rhythm, normal S1/S2, no S3/S4, no murmur. No pretibial or periankle edema.  No  carotid bruit.   Abdomen: Soft, nontender, no distention.  Neurologic: Alert and oriented.  Psych: Normal affect. Skin: Normal. Musculoskeletal: No gross deformities.    ECG: Reviewed above under Subjective   Labs: Lab Results  Component Value Date/Time   K 4.5 02/12/2019 06:00 AM   BUN 26 (H) 02/12/2019 06:00 AM   BUN 23 05/26/2018 07:49 AM   CREATININE 1.06 02/12/2019 06:00 AM   CREATININE 0.93 04/23/2014 07:04 AM   ALT 14 01/10/2019 08:57 AM   TSH 2.800 03/11/2018 10:30 AM   HGB 12.0 (L) 02/12/2019 06:00 AM   HGB 9.9 (L) 10/06/2018 08:44 AM     Lipids: Lab Results  Component Value Date/Time   LDLCALC 100 (H) 12/27/2018 08:26 AM   CHOL 161 12/27/2018 08:26 AM   TRIG 58 12/27/2018 08:26 AM   HDL 49 12/27/2018 08:26 AM     Additional studies/ records that were reviewed today include:   NST: 12/2016  There was no ST segment deviation noted during stress.  Findings consistent with prior inferior/inferoseptal/inferoapical myocardial infarction with moderate peri-infarct ischemia.  This is an intermediate risk study.  The left ventricular ejection fraction is normal (55-65%).  Cardiac Catheterization: 01/2017  Prox RCA lesion, 15 %stenosed.  Dist RCA lesion, 20 %stenosed.  1st Diag lesion, 20 %stenosed.  Ost LAD-1 lesion, 40 %stenosed.  Ost LAD-2 lesion, 0 %stenosed.  Dist LAD-1 lesion, 30 %stenosed.  Dist LAD-2 lesion, 15 %stenosed.  Mild residual CAD with a patent proximal LAD stent and 30-40% narrowing immediately proximal to the stented segment, 20% proximal diagonal stenosis, 30% mid LAD stenosis, patent distal LAD stent with 15% intimal hyperplasia; normal left circumflex system; mild 20% proximal and distal stenoses in a dominant RCA.  RECOMMENDATION: Medical therapy. Recommend echo Doppler study for LV function assessment since despite prolonged attempt at crossing the aortic valve with a catheter due to the significant tortuosity and left  ventriculography was not able to be performed during this procedure.  Echocardiogram: 09/03/2018 IMPRESSIONS  1. The left ventricle has normal systolic function of 44-31%. The cavity size was normal. There is no increased left ventricular wall thickness. Echo evidence of impaired diastolic relaxation Normal left ventricular filling pressures. 2. The right ventricle has normal systolic function. The cavity was normal. There is no increase in right ventricular wall thickness. 3. Left atrial size was mildly dilated. 4. The mitral valve is normal in structure. No evidence of mitral valve stenosis. 5. The tricuspid valve is normal in structure. 6. The aortic valve is tricuspid. 7. The aortic root is normal in size and structure.     ASSESSMENT AND PLAN:  1.  Coronary artery disease: Symptomatically stable.  Continue carvedilol and atorvastatin.  I will resume aspirin as he is no longer on Eliquis.  2.  Hypertension: Normal blood pressure today.  No changes to therapy.  3.  Hyperlipidemia: Continue atorvastatin 80 mg.  LDL most recently above goal at 160 20.  It was normal at 63 prior to that in September 2019.  He is getting back to getting more physical activity.   Disposition: Follow up 1 year   Kate Sable, M.D., F.A.C.C.

## 2019-03-28 NOTE — Patient Instructions (Signed)
Medication Instructions: Your physician recommends that you continue on your current medications as directed. Please refer to the Current Medication list given to you today.   Labwork: None today  Procedures/Testing: None  Follow-Up: 1 year with [place] ,PA-C  Any Additional Special Instructions Will Be Listed Below (If Applicable).     If you need a refill on your cardiac medications before your next appointment, please call your pharmacy.     Thank you for choosing [facility] !

## 2019-04-04 ENCOUNTER — Other Ambulatory Visit: Payer: Self-pay | Admitting: *Deleted

## 2019-04-04 MED ORDER — CONTOUR NEXT TEST VI STRP: ORAL_STRIP | 0 refills | Status: DC

## 2019-04-07 ENCOUNTER — Other Ambulatory Visit: Payer: Self-pay | Admitting: Family Medicine

## 2019-04-10 DIAGNOSIS — M79674 Pain in right toe(s): Secondary | ICD-10-CM | POA: Diagnosis not present

## 2019-04-10 DIAGNOSIS — B351 Tinea unguium: Secondary | ICD-10-CM | POA: Diagnosis not present

## 2019-04-10 DIAGNOSIS — E1151 Type 2 diabetes mellitus with diabetic peripheral angiopathy without gangrene: Secondary | ICD-10-CM | POA: Diagnosis not present

## 2019-04-10 DIAGNOSIS — L600 Ingrowing nail: Secondary | ICD-10-CM | POA: Diagnosis not present

## 2019-04-11 ENCOUNTER — Other Ambulatory Visit: Payer: Self-pay

## 2019-04-11 ENCOUNTER — Other Ambulatory Visit (INDEPENDENT_AMBULATORY_CARE_PROVIDER_SITE_OTHER): Payer: Medicare Other | Admitting: *Deleted

## 2019-04-11 DIAGNOSIS — Z23 Encounter for immunization: Secondary | ICD-10-CM | POA: Diagnosis not present

## 2019-04-17 ENCOUNTER — Other Ambulatory Visit: Payer: Self-pay | Admitting: Family Medicine

## 2019-04-20 ENCOUNTER — Other Ambulatory Visit: Payer: Self-pay | Admitting: Family Medicine

## 2019-04-26 ENCOUNTER — Ambulatory Visit (INDEPENDENT_AMBULATORY_CARE_PROVIDER_SITE_OTHER): Payer: Medicare Other | Admitting: Orthopedic Surgery

## 2019-04-26 ENCOUNTER — Ambulatory Visit: Payer: Medicare Other

## 2019-04-26 ENCOUNTER — Other Ambulatory Visit: Payer: Self-pay

## 2019-04-26 VITALS — BP 121/61 | HR 63 | Temp 97.0°F | Ht 71.0 in | Wt 210.0 lb

## 2019-04-26 DIAGNOSIS — I2510 Atherosclerotic heart disease of native coronary artery without angina pectoris: Secondary | ICD-10-CM

## 2019-04-26 DIAGNOSIS — M25521 Pain in right elbow: Secondary | ICD-10-CM | POA: Diagnosis not present

## 2019-04-26 DIAGNOSIS — E119 Type 2 diabetes mellitus without complications: Secondary | ICD-10-CM | POA: Diagnosis not present

## 2019-04-26 NOTE — Progress Notes (Signed)
Jerry Burns  04/26/2019  HISTORY SECTION :  Chief Complaint  Patient presents with  . Elbow Pain    Right elbow pain, fall on 02-11-19.   85 year old male presents with swelling right elbow.  He fell a few months ago sometime around July 18 and then developed pain and swelling over the right elbow.  He currently is having no pain so it is getting better but he has a mass over the right elbow which looks like a bursal swelling.  This is not associated with any numbness tingling or loss of motion  Review of Systems  All other systems reviewed and are negative.    has a past medical history of Anginal pain ([place]), Arthritis, Coronary artery disease, Diabetes mellitus, DVT, lower extremity, distal, acute ([facility]) (09/09/2018), Hyperlipidemia, Hypertension, Progressive angina ([place]) (03/2015), and Stroke [facility]).   Past Surgical History:  Procedure Laterality Date  . APPENDECTOMY    . CARDIAC CATHETERIZATION N/A 04/12/2015   Procedure: Left Heart Cath and Coronary Angiography;  Surgeon: Sherren Mocha, MD; pLAD 75>0% w/ 3.0x12 mm Resolute DES, dLAD 80>0% w/ Angiosculpt scoring balloon, CFX luminal irreg, RCA mild dz, EF 55-65%   . COLONOSCOPY  2011   RMR: 1. Normal rectum 2. Left sided diverticula , 2 ascending colon polyps, status post snare polypectomy. Remainder of colonic mucosa appeared unremarkable.   . COLONOSCOPY N/A 05/13/2016   Procedure: COLONOSCOPY;  Surgeon: Daneil Dolin, MD;  [facility];  Service: Endoscopy;  Laterality: N/A;  7:30 AM  . COLONOSCOPY N/A 05/18/2018   Procedure: COLONOSCOPY;  Surgeon: Daneil Dolin, MD;  [facility];  Service: Endoscopy;  Laterality: N/A;  1:15pm  . CORONARY ANGIOGRAPHY N/A 02/04/2017   Procedure: CORONARY ANGIOGRAPHY (CATH LAB);  Surgeon: Troy Sine, MD;  Location: [facility] CV LAB;  Service: Cardiovascular;  Laterality: N/A;  . CORONARY ANGIOPLASTY WITH STENT PLACEMENT  08/22/2014   PTCA/DES X mLAD with 2.5 x 16  mm Promus Premier DES by Dr Julianne Handler  . ESOPHAGOGASTRODUODENOSCOPY N/A 05/18/2018   Procedure: ESOPHAGOGASTRODUODENOSCOPY (EGD);  Surgeon: Daneil Dolin, MD;  [facility];  Service: Endoscopy;  Laterality: N/A;  . FOOT SURGERY Right   . LEFT HEART CATHETERIZATION WITH CORONARY ANGIOGRAM N/A 08/22/2014   Procedure: LEFT HEART CATHETERIZATION WITH CORONARY ANGIOGRAM;  Surgeon: Burnell Blanks, MD;  Location: [facility] CATH LAB;  Service: Cardiovascular;  Laterality: N/A;  . PERCUTANEOUS CORONARY STENT INTERVENTION (PCI-S)  08/22/2014   Procedure: PERCUTANEOUS CORONARY STENT INTERVENTION (PCI-S);  Surgeon: Burnell Blanks, MD;   . SHOULDER SURGERY    . TONSILLECTOMY      Body mass index is 29.29 kg/m.   Allergies  Allergen Reactions  . Adhesive [Tape]     bandaids cause blisters after leaving on for 24 hours   . Sulfa Antibiotics Other (See Comments)    "was terrible" "eye went crazy"  . Latex Other (See Comments)    blisters     Current Outpatient Medications:  .  amLODipine (NORVASC) 10 MG tablet, TAKE (1) TABLET BY MOUTH ONCE DAILY., Disp: 90 tablet, Rfl: 0 .  Ascorbic Acid (VITAMIN C) 1000 MG tablet, Take 500 mg by mouth daily., Disp: , Rfl:  .  atorvastatin (LIPITOR) 80 MG tablet, TAKE (1) TABLET BY MOUTH ONCE DAILY., Disp: 90 tablet, Rfl: 0 .  carvedilol (COREG) 12.5 MG tablet, TAKE (1) TABLET BY MOUTH TWICE DAILY., Disp: 180 tablet, Rfl: 3 .  cholecalciferol (VITAMIN D3) 25 MCG (1000  UT) tablet, Take 1,000 Units by mouth daily., Disp: , Rfl:  .  COMBIGAN 0.2-0.5 % ophthalmic solution, Place 1 drop into the left eye 2 (two) times daily. , Disp: , Rfl: 2 .  dutasteride (AVODART) 0.5 MG capsule, TAKE ONE CAPSULE BY MOUTH ONCE DAILY., Disp: 90 capsule, Rfl: 0 .  Emollient (CERAVE) CREA, Apply 1 application topically daily as needed (dry skin)., Disp: , Rfl:  .  Flaxseed, Linseed, (FLAXSEED OIL PO), Take by mouth daily., Disp: , Rfl:  .  glucose blood (CONTOUR  NEXT TEST) test strip, USE 1 STRIP TO CHECK GLUCOSE THREE TIMES DAILY. FOR ICD 10 E11.9, Disp: 300 each, Rfl: 0 .  guaiFENesin (MUCINEX) 600 MG 12 hr tablet, Take 600 mg by mouth 2 (two) times daily., Disp: , Rfl:  .  JANUVIA 100 MG tablet, TAKE (1) TABLET BY MOUTH ONCE DAILY., Disp: 30 tablet, Rfl: 2 .  LANTUS SOLOSTAR 100 UNIT/ML Solostar Pen, INJECT 22 UNITS SUBCUTANEOUSLY ONCE DAILY AT 10PM, Disp: 15 mL, Rfl: 0 .  metFORMIN (GLUCOPHAGE) 1000 MG tablet, TAKE 1 TABLET BY MOUTH TWICE DAILY WITH A MEAL., Disp: 180 tablet, Rfl: 0 .  Multiple Vitamin (MULTIVITAMIN) tablet, Take 1 tablet by mouth daily., Disp: , Rfl:  .  nitroGLYCERIN (NITROSTAT) 0.4 MG SL tablet, PLACE 1 TAB UNDER TONGUE EVERY 5 MIN IF NEEDED FOR CHEST PAIN. MAY USE 3 TIMES.NO RELIEF CALL 911., Disp: 25 tablet, Rfl: 3 .  pantoprazole (PROTONIX) 40 MG tablet, TAKE ONE TABLET BY MOUTH ONCE DAILY., Disp: 30 tablet, Rfl: 5 .  Polyvinyl Alcohol-Povidone (REFRESH OP), Place 1 drop into the left eye 2 (two) times daily., Disp: , Rfl:  .  psyllium (METAMUCIL) 58.6 % powder, Take 1 packet by mouth daily. , Disp: , Rfl:  .  tamsulosin (FLOMAX) 0.4 MG CAPS capsule, TAKE (1) CAPSULE BY MOUTH ONCE DAILY., Disp: 90 capsule, Rfl: 0 .  telmisartan (MICARDIS) 40 MG tablet, Take 1 tablet (40 mg total) by mouth daily., Disp: 30 tablet, Rfl: 3 .  torsemide (DEMADEX) 20 MG tablet, TAKE (1) TABLET BY MOUTH EACH MORNING., Disp: 30 tablet, Rfl: 5 .  Turmeric 500 MG CAPS, Take 500 mg by mouth daily., Disp: , Rfl:  .  bacitracin ointment, Apply 1 application topically 2 (two) times daily. (Patient not taking: Reported on 04/26/2019), Disp: 120 g, Rfl: 0   PHYSICAL EXAM SECTION: 1) BP 121/61   Pulse 63   Temp (!) 97 F (36.1 C)   Ht 5\' 11"  (1.803 m)   Wt 210 lb (95.3 kg)   BMI 29.29 kg/m   Body mass index is 29.29 kg/m. General appearance: Well-developed well-nourished no gross deformities  2) Cardiovascular normal pulse and perfusion in the UPPER  extremities normal color without edema  3) Neurologically deep tendon reflexes are equal and normal, no sensation loss or deficits no pathologic reflexes  4) Psychological: Awake alert and oriented x3 mood and affect normal  5) Skin no lacerations or ulcerations no nodularity no palpable masses, no erythema or nodularity  6) Musculoskeletal:  Left elbow normal range of motion no skin abnormalities no joint subluxation dislocation  Right elbow mass over the right elbow nontender looks like a small bursal sac full range of motion elbow stable muscle tone normal   MEDICAL DECISION SECTION:  Encounter Diagnosis  Name Primary?  . Pain in right elbow Yes    Imaging NONE  Plan:  (Rx., Inj., surg., Frx, MRI/CT, XR:2) Aspiration right elbow bursa  Verbal consent  given and timeout taken right elbow site confirmed  Cleaned with alcohol sprayed with ethyl chloride aspirated 3 to 4 cc of bloody fluid  Ace wrap applied  Patient is advised to see [place] again if it comes back for second aspiration   11:08 AM Arther Abbott, MD  04/26/2019

## 2019-04-26 NOTE — Patient Instructions (Addendum)
Ace wrap for a few days  Elbow Bursitis Bursitis is swelling and pain at the tip of your elbow. This happens when fluid builds up in a sac under your skin (bursa). This may also be called olecranon bursitis. Follow these instructions at home: Medicines  Take over-the-counter and prescription medicines only as told by your doctor.  If you were prescribed an antibiotic, take it exactly as told by your doctor. Do not stop taking it even if you start to feel better. Managing pain, stiffness, and swelling   If told, put ice on your elbow: ? Put ice in a plastic bag. ? Place a towel between your skin and the bag. ? Leave the ice on for 20 minutes, 2-3 times a day.  If your bursitis is caused by an injury, follow instructions from your doctor about: ? Resting your elbow. ? Wearing a bandage.  Wear elbow pads or elbow wraps as needed. These help cushion your elbow. General instructions  Avoid any activities that cause elbow pain. Ask your doctor what activities are safe for you.  Keep all follow-up visits as told by your doctor. This is important. Contact a doctor if you have:  A fever.  Problems that do not get better with treatment.  Pain or swelling that: ? Gets worse. ? Goes away and then comes back.  Pus draining from your elbow. Get help right away if you have:  Trouble moving your arm, hand, or fingers. Summary  Bursitis is swelling and pain at the tip of the elbow.  You may need to take medicine or put ice on your elbow.  Contact your doctor if your problems do not get better with treatment. This information is not intended to replace advice given to you by your health care provider. Make sure you discuss any questions you have with your health care provider. Document Released: 12/31/2009 Document Revised: 06/25/2017 Document Reviewed: 06/22/2017 Elsevier Patient Education  2020 [employer].

## 2019-04-27 LAB — BASIC METABOLIC PANEL
BUN/Creatinine Ratio: 20 (ref 10–24)
BUN: 23 mg/dL (ref 8–27)
CO2: 24 mmol/L (ref 20–29)
Calcium: 9.6 mg/dL (ref 8.6–10.2)
Chloride: 104 mmol/L (ref 96–106)
Creatinine, Ser: 1.13 mg/dL (ref 0.76–1.27)
GFR calc Af Amer: 69 mL/min/{1.73_m2} (ref >59)
GFR calc non Af Amer: 60 mL/min/{1.73_m2} (ref >59)
Glucose: 152 mg/dL — ABNORMAL HIGH (ref 65–99)
Potassium: 5.5 mmol/L — ABNORMAL HIGH (ref 3.5–5.2)
Sodium: 139 mmol/L (ref 134–144)

## 2019-04-27 LAB — HEMOGLOBIN A1C
Est. average glucose Bld gHb Est-mCnc: 148 mg/dL
Hgb A1c MFr Bld: 6.8 % — ABNORMAL HIGH (ref 4.8–5.6)

## 2019-04-28 ENCOUNTER — Other Ambulatory Visit: Payer: Self-pay | Admitting: Gastroenterology

## 2019-05-01 ENCOUNTER — Other Ambulatory Visit: Payer: Self-pay

## 2019-05-01 ENCOUNTER — Ambulatory Visit (INDEPENDENT_AMBULATORY_CARE_PROVIDER_SITE_OTHER): Payer: Medicare Other | Admitting: Family Medicine

## 2019-05-01 ENCOUNTER — Other Ambulatory Visit: Payer: Self-pay | Admitting: Family Medicine

## 2019-05-01 VITALS — BP 136/64 | Ht 71.0 in | Wt 212.8 lb

## 2019-05-01 DIAGNOSIS — E875 Hyperkalemia: Secondary | ICD-10-CM | POA: Diagnosis not present

## 2019-05-01 DIAGNOSIS — Z794 Long term (current) use of insulin: Secondary | ICD-10-CM | POA: Diagnosis not present

## 2019-05-01 DIAGNOSIS — I10 Essential (primary) hypertension: Secondary | ICD-10-CM | POA: Diagnosis not present

## 2019-05-01 DIAGNOSIS — E785 Hyperlipidemia, unspecified: Secondary | ICD-10-CM

## 2019-05-01 DIAGNOSIS — I2510 Atherosclerotic heart disease of native coronary artery without angina pectoris: Secondary | ICD-10-CM | POA: Diagnosis not present

## 2019-05-01 DIAGNOSIS — E1140 Type 2 diabetes mellitus with diabetic neuropathy, unspecified: Secondary | ICD-10-CM

## 2019-05-01 MED ORDER — SITAGLIPTIN PHOSPHATE 100 MG PO TABS: ORAL_TABLET | ORAL | 5 refills | Status: DC

## 2019-05-01 MED ORDER — METFORMIN HCL 1000 MG PO TABS: ORAL_TABLET | ORAL | 1 refills | Status: DC

## 2019-05-01 MED ORDER — DUTASTERIDE 0.5 MG PO CAPS: 0.5000 mg | ORAL_CAPSULE | Freq: Every day | ORAL | 1 refills | Status: DC

## 2019-05-01 MED ORDER — TORSEMIDE 20 MG PO TABS: ORAL_TABLET | ORAL | 5 refills | Status: DC

## 2019-05-01 MED ORDER — LANTUS SOLOSTAR 100 UNIT/ML ~~LOC~~ SOPN: PEN_INJECTOR | SUBCUTANEOUS | 1 refills | Status: DC

## 2019-05-01 MED ORDER — AMLODIPINE BESYLATE 10 MG PO TABS: ORAL_TABLET | ORAL | 1 refills | Status: DC

## 2019-05-01 MED ORDER — ATORVASTATIN CALCIUM 80 MG PO TABS: ORAL_TABLET | ORAL | 1 refills | Status: DC

## 2019-05-01 NOTE — Progress Notes (Signed)
Subjective:    Patient ID: Jerry Burns, male    DOB: 1934/02/17, 85 y.o.   MRN: [id number]  Diabetes He presents for his follow-up diabetic visit. He has type 2 diabetes mellitus. Pertinent negatives for hypoglycemia include no confusion, dizziness or headaches. Pertinent negatives for diabetes include no chest pain and no fatigue. Risk factors for coronary artery disease include diabetes mellitus, dyslipidemia and hypertension. Current diabetic treatment includes insulin injections and oral agent (monotherapy). He is compliant with treatment all of the time. His weight is stable. He is following a diabetic diet.   Patient for blood pressure check up.  The patient does have hypertension.  The patient is on medication.  Patient relates compliance with meds. Todays BP reviewed with the patient. Patient denies issues with medication. Patient relates reasonable diet. Patient tries to minimize salt. Patient aware of BP goals. He does take his medicine on a regular basis tries to watch salt in his diet tries to eat healthy does not use salt substitute.  States blood pressure been okay  Patient here for follow-up regarding cholesterol.  The patient does have hyperlipidemia.  Patient does try to maintain a reasonable diet.  Patient does take the medication on a regular basis.  Denies missing a dose.  The patient denies any obvious side effects.  Prior blood work results reviewed with the patient.  The patient is aware of his cholesterol goals and the need to keep it under good control to lessen the risk of disease.  No concerns and has already had allergy shot Takes his cholesterol medicine regular basis. Patient's potassium recently elevated.  It is been this way off and on.  Minimal elevation.  Nonetheless elevated could be related to ARB  Review of Systems  Constitutional: Negative for diaphoresis and fatigue.  HENT: Negative for congestion and rhinorrhea.   Respiratory: Negative for cough and  shortness of breath.   Cardiovascular: Negative for chest pain and leg swelling.  Gastrointestinal: Negative for abdominal pain and diarrhea.  Skin: Negative for color change and rash.  Neurological: Negative for dizziness and headaches.  Psychiatric/Behavioral: Negative for behavioral problems and confusion.       Objective:   Physical Exam Vitals signs reviewed.  Constitutional:      General: He is not in acute distress. HENT:     Head: Normocephalic and atraumatic.  Eyes:     General:        Right eye: No discharge.        Left eye: No discharge.  Neck:     Trachea: No tracheal deviation.  Cardiovascular:     Rate and Rhythm: Normal rate and regular rhythm.     Heart sounds: Normal heart sounds. No murmur.  Pulmonary:     Effort: Pulmonary effort is normal. No respiratory distress.     Breath sounds: Normal breath sounds.  Lymphadenopathy:     Cervical: No cervical adenopathy.  Skin:    General: Skin is warm and dry.  Neurological:     Mental Status: He is alert.     Coordination: Coordination normal.  Psychiatric:        Behavior: Behavior normal.      25 minutes was spent with the patient.  This statement verifies that 25 minutes was indeed spent with the patient.  More than 50% of this visit-total duration of the visit-was spent in counseling and coordination of care. The issues that the patient came in for today as reflected in the diagnosis (  s) please refer to documentation for further details.      Assessment & Plan:  1. Essential hypertension Blood pressure good response take medication healthy diet recommended regular activity  2. Type 2 diabetes mellitus with diabetic neuropathy, with long-term current use of insulin (HCC) Diabetes decent control.  Tolerating medicine well.  3. Hyperlipidemia, unspecified hyperlipidemia type Hyperlipidemia under decent control continue medication  4. Hyperkalemia Potassium elevated I believe this is coming from his  ARB We will communicate with cardiology to see if they are recommending a lower dose or stopping the medicine

## 2019-05-16 ENCOUNTER — Other Ambulatory Visit: Payer: Self-pay | Admitting: *Deleted

## 2019-05-16 ENCOUNTER — Telehealth: Payer: Self-pay | Admitting: Family Medicine

## 2019-05-16 ENCOUNTER — Inpatient Hospital Stay (HOSPITAL_COMMUNITY): Payer: Medicare Other | Attending: Hematology

## 2019-05-16 ENCOUNTER — Other Ambulatory Visit: Payer: Self-pay

## 2019-05-16 DIAGNOSIS — Z800 Family history of malignant neoplasm of digestive organs: Secondary | ICD-10-CM | POA: Insufficient documentation

## 2019-05-16 DIAGNOSIS — Z7901 Long term (current) use of anticoagulants: Secondary | ICD-10-CM | POA: Insufficient documentation

## 2019-05-16 DIAGNOSIS — E875 Hyperkalemia: Secondary | ICD-10-CM

## 2019-05-16 DIAGNOSIS — Z86718 Personal history of other venous thrombosis and embolism: Secondary | ICD-10-CM | POA: Diagnosis not present

## 2019-05-16 DIAGNOSIS — D649 Anemia, unspecified: Secondary | ICD-10-CM | POA: Diagnosis not present

## 2019-05-16 DIAGNOSIS — I10 Essential (primary) hypertension: Secondary | ICD-10-CM | POA: Insufficient documentation

## 2019-05-16 DIAGNOSIS — Z808 Family history of malignant neoplasm of other organs or systems: Secondary | ICD-10-CM | POA: Insufficient documentation

## 2019-05-16 DIAGNOSIS — Z87891 Personal history of nicotine dependence: Secondary | ICD-10-CM | POA: Diagnosis not present

## 2019-05-16 DIAGNOSIS — E785 Hyperlipidemia, unspecified: Secondary | ICD-10-CM | POA: Insufficient documentation

## 2019-05-16 LAB — CBC WITH DIFFERENTIAL/PLATELET
Abs Immature Granulocytes: 0.02 10*3/uL (ref 0.00–0.07)
Basophils Absolute: 0.1 10*3/uL (ref 0.0–0.1)
Basophils Relative: 1 %
Eosinophils Absolute: 0.5 10*3/uL (ref 0.0–0.5)
Eosinophils Relative: 7 %
HCT: 35.2 % — ABNORMAL LOW (ref 39.0–52.0)
Hemoglobin: 11.2 g/dL — ABNORMAL LOW (ref 13.0–17.0)
Immature Granulocytes: 0 %
Lymphocytes Relative: 16 %
Lymphs Abs: 1.1 10*3/uL (ref 0.7–4.0)
MCH: 30.2 pg (ref 26.0–34.0)
MCHC: 31.8 g/dL (ref 30.0–36.0)
MCV: 94.9 fL (ref 80.0–100.0)
Monocytes Absolute: 0.8 10*3/uL (ref 0.1–1.0)
Monocytes Relative: 12 %
Neutro Abs: 4.3 10*3/uL (ref 1.7–7.7)
Neutrophils Relative %: 64 %
Platelets: 210 10*3/uL (ref 150–400)
RBC: 3.71 MIL/uL — ABNORMAL LOW (ref 4.22–5.81)
RDW: 12.6 % (ref 11.5–15.5)
WBC: 6.7 10*3/uL (ref 4.0–10.5)
nRBC: 0 % (ref 0.0–0.2)

## 2019-05-16 LAB — IRON AND TIBC
Iron: 89 ug/dL (ref 45–182)
Saturation Ratios: 34 % (ref 17.9–39.5)
TIBC: 261 ug/dL (ref 250–450)
UIBC: 172 ug/dL

## 2019-05-16 LAB — COMPREHENSIVE METABOLIC PANEL
ALT: 16 U/L (ref 0–44)
AST: 21 U/L (ref 15–41)
Albumin: 4.2 g/dL (ref 3.5–5.0)
Alkaline Phosphatase: 69 U/L (ref 38–126)
Anion gap: 8 (ref 5–15)
BUN: 33 mg/dL — ABNORMAL HIGH (ref 8–23)
CO2: 28 mmol/L (ref 22–32)
Calcium: 9.3 mg/dL (ref 8.9–10.3)
Chloride: 102 mmol/L (ref 98–111)
Creatinine, Ser: 1.35 mg/dL — ABNORMAL HIGH (ref 0.61–1.24)
GFR calc Af Amer: 56 mL/min — ABNORMAL LOW (ref >60)
GFR calc non Af Amer: 48 mL/min — ABNORMAL LOW (ref >60)
Glucose, Bld: 138 mg/dL — ABNORMAL HIGH (ref 70–99)
Potassium: 4.6 mmol/L (ref 3.5–5.1)
Sodium: 138 mmol/L (ref 135–145)
Total Bilirubin: 0.5 mg/dL (ref 0.3–1.2)
Total Protein: 7.1 g/dL (ref 6.5–8.1)

## 2019-05-16 LAB — LACTATE DEHYDROGENASE: LDH: 133 U/L (ref 98–192)

## 2019-05-16 LAB — VITAMIN D 25 HYDROXY (VIT D DEFICIENCY, FRACTURES): Vit D, 25-Hydroxy: 58.7 ng/mL (ref 30–100)

## 2019-05-16 LAB — FERRITIN: Ferritin: 242 ng/mL (ref 24–336)

## 2019-05-16 LAB — VITAMIN B12: Vitamin B-12: 456 pg/mL (ref 180–914)

## 2019-05-16 LAB — FOLATE: Folate: 23.1 ng/mL (ref >5.9)

## 2019-05-16 NOTE — Telephone Encounter (Signed)
Nurses Please communicate with the patient that I did communicate with his cardiologist regarding his elevated potassium It is felt that it could be triggered by one of his medications Therefore They agree with me to stop Micardis -generic name telmisartan They also agree that the patient should repeat the metabolic 7 in 3 to 4 weeks to make sure the potassium has gone down Please have the patient do the above thank you

## 2019-05-16 NOTE — Telephone Encounter (Signed)
Discussed with pt and pt verbalized understanding. Med list updated and orders for met 7 put in for pt to do in 3-4 weeks.

## 2019-05-23 ENCOUNTER — Ambulatory Visit (HOSPITAL_COMMUNITY): Payer: Medicare Other | Admitting: Nurse Practitioner

## 2019-05-24 ENCOUNTER — Ambulatory Visit (HOSPITAL_COMMUNITY): Payer: Medicare Other | Admitting: Nurse Practitioner

## 2019-05-25 ENCOUNTER — Encounter (HOSPITAL_COMMUNITY): Payer: Self-pay | Admitting: Hematology

## 2019-05-25 ENCOUNTER — Inpatient Hospital Stay (HOSPITAL_BASED_OUTPATIENT_CLINIC_OR_DEPARTMENT_OTHER): Payer: Medicare Other | Admitting: Hematology

## 2019-05-25 ENCOUNTER — Other Ambulatory Visit: Payer: Self-pay

## 2019-05-25 DIAGNOSIS — D649 Anemia, unspecified: Secondary | ICD-10-CM | POA: Diagnosis not present

## 2019-05-25 DIAGNOSIS — I10 Essential (primary) hypertension: Secondary | ICD-10-CM | POA: Diagnosis not present

## 2019-05-25 DIAGNOSIS — Z7901 Long term (current) use of anticoagulants: Secondary | ICD-10-CM | POA: Diagnosis not present

## 2019-05-25 DIAGNOSIS — Z86718 Personal history of other venous thrombosis and embolism: Secondary | ICD-10-CM | POA: Diagnosis not present

## 2019-05-25 DIAGNOSIS — Z87891 Personal history of nicotine dependence: Secondary | ICD-10-CM | POA: Diagnosis not present

## 2019-05-25 DIAGNOSIS — E785 Hyperlipidemia, unspecified: Secondary | ICD-10-CM | POA: Diagnosis not present

## 2019-05-25 NOTE — Assessment & Plan Note (Signed)
1.  Normocytic anemia: - Thought to be from GI blood loss. -He had a hemoglobin of 9.7 in September 2019 followed by a GI work-up. -He had an EGD on 05/18/2018 showed erosive reflux esophagitis, erosive gastropathy.  Biopsy was consistent with chronic active gastritis with H. pylori.  It was thought that he could have been bleeding from upper GI. -Colonoscopy on 05/18/2018 showed diverticulosis of the sigmoid colon, descending colon and transverse colon.  Tubular adenoma from hepatic flexure was resected. - He was admitted to the hospital on 09/02/2017 and diagnosed with a possible nonocclusive thrombus of the determinate acuity within 1 of the paired posterior tibial veins in the right calf.  He was started on Eliquis.  It was also found that he had a hemoglobin of 7.7.  Ferritin was 75 and percent saturation was 9. -He denies any history of blood transfusions.  Denies any B symptoms. - For his work-up his CBC was repeated hemoglobin showed 10.3.  Stool for occult blood x3 was negative.  Direct Coombs test and LDH were normal.  S06 and folic acid were normal. - Feraheme infusion was on 10/03/2018 and 10/12/2018, 01/20/19, and 01/30/2019. -Labs today reveal hemoglobin 11.2, ferritin 242, iron at 89, saturation 34%.  There is no indication for parenteral iron at this time. We will continue to monitor labs.  Patient will return to clinic in 4 months.  2.  Distal DVT: - He had an ultrasound on 09/03/2018 showed possible nonocclusive thrombus of the intermediate acuity within 1 of the posterior tibial veins in the right calf. - He is currently on Eliquis 5 mg twice daily and tolerating well.  That was started in February 2019.

## 2019-05-25 NOTE — Progress Notes (Signed)
[place], [place] [address]   CLINIC:  Medical Oncology/Hematology  PCP:  Kathyrn Drown, MD [address] [place] [address] [phone]   REASON FOR VISIT:  Follow-up for anemia  CURRENT THERAPY: Clinical surveillance    INTERVAL HISTORY:  Jerry Burns 85 y.o. male presents today for follow-up.  He reports overall doing well.  He denies any significant fatigue.  He recently received 2 iron infusions.  She states his energy greatly improved after infusion.  He denies any obvious signs of bleeding.  Denies any change in bowel habits.  Appetite is stable.  Denies any fevers, chills, night sweats.  No weight loss.  Denies any chest pain or shortness of breath, lightheadedness or dizziness.  He is here to repeat labs and office visit.  REVIEW OF SYSTEMS:  Review of Systems  Constitutional: Negative.   HENT:  Negative.   Eyes: Negative.   Respiratory: Negative.   Cardiovascular: Negative.   Gastrointestinal: Negative.   Endocrine: Negative.   Genitourinary: Negative.    Musculoskeletal: Positive for arthralgias and myalgias.  Skin: Negative.   Neurological: Negative.   Hematological: Negative.   Psychiatric/Behavioral: Negative.      PAST MEDICAL/SURGICAL HISTORY:  Past Medical History:  Diagnosis Date  . Anginal pain ([place])   . Arthritis    Bilateral knee  . Coronary artery disease   . Diabetes mellitus    insulin dependent   . DVT, lower extremity, distal, acute ([place]) 09/09/2018  . Hyperlipidemia   . Hypertension   . Progressive angina ([place]) 03/2015   a. s/p DES to proximal LAD and balloon angioplasty to distal LAD in 2016 b. patent stent by repeat cath in 01/2017 with mild residual CAD)  . Stroke [facility])    Past Surgical History:  Procedure Laterality Date  . APPENDECTOMY    . CARDIAC CATHETERIZATION N/A 04/12/2015   Procedure: Left Heart Cath and Coronary Angiography;  Surgeon: Sherren Mocha, MD; pLAD 75>0%  w/ 3.0x12 mm Resolute DES, dLAD 80>0% w/ Angiosculpt scoring balloon, CFX luminal irreg, RCA mild dz, EF 55-65%   . COLONOSCOPY  2011   RMR: 1. Normal rectum 2. Left sided diverticula , 2 ascending colon polyps, status post snare polypectomy. Remainder of colonic mucosa appeared unremarkable.   . COLONOSCOPY N/A 05/13/2016   Procedure: COLONOSCOPY;  Surgeon: Daneil Dolin, MD;  [facility];  Service: Endoscopy;  Laterality: N/A;  7:30 AM  . COLONOSCOPY N/A 05/18/2018   Procedure: COLONOSCOPY;  Surgeon: Daneil Dolin, MD;  [facility];  Service: Endoscopy;  Laterality: N/A;  1:15pm  . CORONARY ANGIOGRAPHY N/A 02/04/2017   Procedure: CORONARY ANGIOGRAPHY (CATH LAB);  Surgeon: Troy Sine, MD;  Location: [facility] CV LAB;  Service: Cardiovascular;  Laterality: N/A;  . CORONARY ANGIOPLASTY WITH STENT PLACEMENT  08/22/2014   PTCA/DES X mLAD with 2.5 x 16 mm Promus Premier DES by Dr Julianne Handler  . ESOPHAGOGASTRODUODENOSCOPY N/A 05/18/2018   Procedure: ESOPHAGOGASTRODUODENOSCOPY (EGD);  Surgeon: Daneil Dolin, MD;  [facility];  Service: Endoscopy;  Laterality: N/A;  . FOOT SURGERY Right   . LEFT HEART CATHETERIZATION WITH CORONARY ANGIOGRAM N/A 08/22/2014   Procedure: LEFT HEART CATHETERIZATION WITH CORONARY ANGIOGRAM;  Surgeon: Burnell Blanks, MD;  Location: [facility] CATH LAB;  Service: Cardiovascular;  Laterality: N/A;  . PERCUTANEOUS CORONARY STENT INTERVENTION (PCI-S)  08/22/2014   Procedure: PERCUTANEOUS CORONARY STENT INTERVENTION (PCI-S);  Surgeon: Burnell Blanks,  MD;   . SHOULDER SURGERY    . TONSILLECTOMY       SOCIAL HISTORY:  Social History   Socioeconomic History  . Marital status: Married    Spouse name: Not on file  . Number of children: Not on file  . Years of education: 72  . Highest education level: Not on file  Occupational History  . Occupation: retired  Scientific laboratory technician  . Financial resource strain: Not on file  . Food  insecurity    Worry: Not on file    Inability: Not on file  . Transportation needs    Medical: Not on file    Non-medical: Not on file  Tobacco Use  . Smoking status: Former Smoker    Packs/day: 1.00    Years: 20.00    Pack years: 20.00    Types: Cigarettes, Cigars    Start date: 08/10/1955    Quit date: 07/28/2007    Years since quitting: 11.8  . Smokeless tobacco: Never Used  Substance and Sexual Activity  . Alcohol use: Not Currently    Alcohol/week: 0.0 standard drinks    Comment: Daily about 1 glass of wine  . Drug use: No  . Sexual activity: Not Currently  Lifestyle  . Physical activity    Days per week: Not on file    Minutes per session: Not on file  . Stress: Not on file  Relationships  . Social Herbalist on phone: Not on file    Gets together: Not on file    Attends religious service: Not on file    Active member of club or organization: Not on file    Attends meetings of clubs or organizations: Not on file    Relationship status: Not on file  . Intimate partner violence    Fear of current or ex partner: Not on file    Emotionally abused: Not on file    Physically abused: Not on file    Forced sexual activity: Not on file  Other Topics Concern  . Not on file  Social History Narrative  . Not on file    FAMILY HISTORY:  Family History  Problem Relation Age of Onset  . Diabetes Other   . Diabetes Brother   . Colon cancer Maternal Uncle   . Skin cancer Mother   . Diabetes Father   . Hypertension Sister   . Skin cancer Sister     CURRENT MEDICATIONS:  Outpatient Encounter Medications as of 05/25/2019  Medication Sig  . amLODipine (NORVASC) 10 MG tablet TAKE (1) TABLET BY MOUTH ONCE DAILY.  Marland Kitchen Ascorbic Acid (VITAMIN C) 1000 MG tablet Take 500 mg by mouth daily.  Marland Kitchen atorvastatin (LIPITOR) 80 MG tablet TAKE (1) TABLET BY MOUTH ONCE DAILY.  . carvedilol (COREG) 12.5 MG tablet TAKE (1) TABLET BY MOUTH TWICE DAILY.  . cholecalciferol (VITAMIN D3)  25 MCG (1000 UT) tablet Take 1,000 Units by mouth daily.  . COMBIGAN 0.2-0.5 % ophthalmic solution Place 1 drop into the left eye 2 (two) times daily.   Marland Kitchen dutasteride (AVODART) 0.5 MG capsule Take 1 capsule (0.5 mg total) by mouth daily.  . Flaxseed, Linseed, (FLAXSEED OIL PO) Take by mouth daily.  Marland Kitchen glucose blood (CONTOUR NEXT TEST) test strip USE 1 STRIP TO CHECK GLUCOSE THREE TIMES DAILY. FOR ICD 10 E11.9  . Insulin Glargine (LANTUS SOLOSTAR) 100 UNIT/ML Solostar Pen Inject 22 units subcutaneously once daily at 10 pm  . metFORMIN (GLUCOPHAGE) 1000 MG  tablet TAKE 1 TABLET BY MOUTH TWICE DAILY WITH A MEAL.  . Multiple Vitamin (MULTIVITAMIN) tablet Take 1 tablet by mouth daily.  . pantoprazole (PROTONIX) 40 MG tablet TAKE ONE TABLET BY MOUTH ONCE DAILY.  . Polyvinyl Alcohol-Povidone (REFRESH OP) Place 1 drop into the left eye 2 (two) times daily.  . psyllium (METAMUCIL) 58.6 % powder Take 1 packet by mouth daily.   . sitaGLIPtin (JANUVIA) 100 MG tablet TAKE (1) TABLET BY MOUTH ONCE DAILY.  . tamsulosin (FLOMAX) 0.4 MG CAPS capsule TAKE (1) CAPSULE BY MOUTH ONCE DAILY.  Marland Kitchen torsemide (DEMADEX) 20 MG tablet Take one tablet qam prn edema  . Turmeric 500 MG CAPS Take 500 mg by mouth daily.  . Emollient (CERAVE) CREA Apply 1 application topically daily as needed (dry skin).  . nitroGLYCERIN (NITROSTAT) 0.4 MG SL tablet PLACE 1 TAB UNDER TONGUE EVERY 5 MIN IF NEEDED FOR CHEST PAIN. MAY USE 3 TIMES.NO RELIEF CALL 911. (Patient not taking: Reported on 05/25/2019)  . [DISCONTINUED] bacitracin ointment Apply 1 application topically 2 (two) times daily. (Patient not taking: Reported on 04/26/2019)  . [DISCONTINUED] guaiFENesin (MUCINEX) 600 MG 12 hr tablet Take 600 mg by mouth 2 (two) times daily.   No facility-administered encounter medications on file as of 05/25/2019.     ALLERGIES:  Allergies  Allergen Reactions  . Adhesive [Tape]     bandaids cause blisters after leaving on for 24 hours   . Sulfa  Antibiotics Other (See Comments)    "was terrible" "eye went crazy"  . Latex Other (See Comments)    blisters     PHYSICAL EXAM:  ECOG Performance status: 1  Vitals:   05/25/19 0918  BP: 112/61  Pulse: 68  Resp: 18  Temp: (!) 97.1 F (36.2 C)  SpO2: 99%   Filed Weights   05/25/19 0918  Weight: 214 lb 1.6 oz (97.1 kg)    Physical Exam Constitutional:      Appearance: Normal appearance.  HENT:     Head: Normocephalic.     Right Ear: External ear normal.     Left Ear: External ear normal.     Nose: Nose normal.     Mouth/Throat:     Pharynx: Oropharynx is clear.  Eyes:     Conjunctiva/sclera: Conjunctivae normal.  Neck:     Musculoskeletal: Normal range of motion.  Cardiovascular:     Rate and Rhythm: Normal rate and regular rhythm.     Pulses: Normal pulses.     Heart sounds: Normal heart sounds.  Pulmonary:     Effort: Pulmonary effort is normal.     Breath sounds: Normal breath sounds.  Abdominal:     General: Bowel sounds are normal.  Musculoskeletal: Normal range of motion.  Skin:    General: Skin is warm.  Neurological:     General: No focal deficit present.     Mental Status: He is alert and oriented to person, place, and time.  Psychiatric:        Mood and Affect: Mood normal.        Behavior: Behavior normal.        Thought Content: Thought content normal.        Judgment: Judgment normal.      LABORATORY DATA:  I have reviewed the labs as listed.  CBC    Component Value Date/Time   WBC 6.7 05/16/2019 0847   RBC 3.71 (L) 05/16/2019 0847   HGB 11.2 (L) 05/16/2019 0847   HGB  9.9 (L) 10/06/2018 0844   HCT 35.2 (L) 05/16/2019 0847   HCT 29.8 (L) 10/06/2018 0844   PLT 210 05/16/2019 0847   PLT 260 10/06/2018 0844   MCV 94.9 05/16/2019 0847   MCV 81 10/06/2018 0844   MCH 30.2 05/16/2019 0847   MCHC 31.8 05/16/2019 0847   RDW 12.6 05/16/2019 0847   RDW 13.9 10/06/2018 0844   LYMPHSABS 1.1 05/16/2019 0847   LYMPHSABS 1.1 10/06/2018 0844    MONOABS 0.8 05/16/2019 0847   EOSABS 0.5 05/16/2019 0847   EOSABS 0.5 (H) 10/06/2018 0844   BASOSABS 0.1 05/16/2019 0847   BASOSABS 0.1 10/06/2018 0844   CMP Latest Ref Rng & Units 05/16/2019 04/26/2019 02/12/2019  Glucose 70 - 99 mg/dL 138(H) 152(H) 213(H)  BUN 8 - 23 mg/dL 33(H) 23 26(H)  Creatinine 0.61 - 1.24 mg/dL 1.35(H) 1.13 1.06  Sodium 135 - 145 mmol/L 138 139 137  Potassium 3.5 - 5.1 mmol/L 4.6 5.5(H) 4.5  Chloride 98 - 111 mmol/L 102 104 104  CO2 22 - 32 mmol/L 28 24 22   Calcium 8.9 - 10.3 mg/dL 9.3 9.6 9.3  Total Protein 6.5 - 8.1 g/dL 7.1 - -  Total Bilirubin 0.3 - 1.2 mg/dL 0.5 - -  Alkaline Phos 38 - 126 U/L 69 - -  AST 15 - 41 U/L 21 - -  ALT 0 - 44 U/L 16 - -         ASSESSMENT & PLAN:   Normochromic normocytic anemia 1.  Normocytic anemia: - Thought to be from GI blood loss. -He had a hemoglobin of 9.7 in September 2019 followed by a GI work-up. -He had an EGD on 05/18/2018 showed erosive reflux esophagitis, erosive gastropathy.  Biopsy was consistent with chronic active gastritis with H. pylori.  It was thought that he could have been bleeding from upper GI. -Colonoscopy on 05/18/2018 showed diverticulosis of the sigmoid colon, descending colon and transverse colon.  Tubular adenoma from hepatic flexure was resected. - He was admitted to the hospital on 09/02/2017 and diagnosed with a possible nonocclusive thrombus of the determinate acuity within 1 of the paired posterior tibial veins in the right calf.  He was started on Eliquis.  It was also found that he had a hemoglobin of 7.7.  Ferritin was 75 and percent saturation was 9. -He denies any history of blood transfusions.  Denies any B symptoms. - For his work-up his CBC was repeated hemoglobin showed 10.3.  Stool for occult blood x3 was negative.  Direct Coombs test and LDH were normal.  U76 and folic acid were normal. - Feraheme infusion was on 10/03/2018 and 10/12/2018, 01/20/19, and 01/30/2019. -Labs today  reveal hemoglobin 11.2, ferritin 242, iron at 89, saturation 34%.  There is no indication for parenteral iron at this time. We will continue to monitor labs.  Patient will return to clinic in 4 months.  2.  Distal DVT: - He had an ultrasound on 09/03/2018 showed possible nonocclusive thrombus of the intermediate acuity within 1 of the posterior tibial veins in the right calf. - He is currently on Eliquis 5 mg twice daily and tolerating well.  That was started in February 2019.           [place] [phone]

## 2019-06-05 DIAGNOSIS — E875 Hyperkalemia: Secondary | ICD-10-CM | POA: Diagnosis not present

## 2019-06-06 LAB — BASIC METABOLIC PANEL
BUN/Creatinine Ratio: 22 (ref 10–24)
BUN: 31 mg/dL — ABNORMAL HIGH (ref 8–27)
CO2: 25 mmol/L (ref 20–29)
Calcium: 9.7 mg/dL (ref 8.6–10.2)
Chloride: 100 mmol/L (ref 96–106)
Creatinine, Ser: 1.41 mg/dL — ABNORMAL HIGH (ref 0.76–1.27)
GFR calc Af Amer: 53 mL/min/{1.73_m2} — ABNORMAL LOW (ref >59)
GFR calc non Af Amer: 46 mL/min/{1.73_m2} — ABNORMAL LOW (ref >59)
Glucose: 147 mg/dL — ABNORMAL HIGH (ref 65–99)
Potassium: 5.6 mmol/L — ABNORMAL HIGH (ref 3.5–5.2)
Sodium: 139 mmol/L (ref 134–144)

## 2019-06-13 ENCOUNTER — Encounter: Payer: Self-pay | Admitting: Family Medicine

## 2019-06-13 DIAGNOSIS — H40053 Ocular hypertension, bilateral: Secondary | ICD-10-CM | POA: Diagnosis not present

## 2019-06-13 LAB — HM DIABETES EYE EXAM

## 2019-06-26 ENCOUNTER — Other Ambulatory Visit: Payer: Self-pay | Admitting: Family Medicine

## 2019-07-10 ENCOUNTER — Telehealth: Payer: Self-pay | Admitting: Family Medicine

## 2019-07-10 DIAGNOSIS — Z794 Long term (current) use of insulin: Secondary | ICD-10-CM

## 2019-07-10 DIAGNOSIS — E785 Hyperlipidemia, unspecified: Secondary | ICD-10-CM

## 2019-07-10 DIAGNOSIS — I10 Essential (primary) hypertension: Secondary | ICD-10-CM

## 2019-07-10 DIAGNOSIS — E1140 Type 2 diabetes mellitus with diabetic neuropathy, unspecified: Secondary | ICD-10-CM

## 2019-07-10 NOTE — Telephone Encounter (Signed)
Pt has virtual appt 1/5 and would like to have lab work done before appt.

## 2019-07-10 NOTE — Telephone Encounter (Signed)
Last labs ordered and completed by our office on 12/27/2018 were A1C and Lipid. Please advise. Thank you

## 2019-07-10 NOTE — Addendum Note (Signed)
Addended by: Vicente Males on: 07/10/2019 11:45 AM   Modules accepted: Orders

## 2019-07-10 NOTE — Telephone Encounter (Signed)
Lab orders placed and pt is aware. Pt advised to have lab work done after Dec 30;pt verbalized understanding.

## 2019-07-10 NOTE — Telephone Encounter (Signed)
Patient should do B8Q, lipid, metabolic 7, urine ACR. I recommend that this patient not do this until December 30 or later because his last A1c was September 30 and Medicare regulates how often this can be checked So if he does the blood work December 30, 31 or January 4 it should be fine from a billing standpoint

## 2019-07-28 LAB — BASIC METABOLIC PANEL
BUN/Creatinine Ratio: 22 (ref 10–24)
BUN: 31 mg/dL — ABNORMAL HIGH (ref 8–27)
CO2: 23 mmol/L (ref 20–29)
Calcium: 9.6 mg/dL (ref 8.6–10.2)
Chloride: 101 mmol/L (ref 96–106)
Creatinine, Ser: 1.41 mg/dL — ABNORMAL HIGH (ref 0.76–1.27)
GFR calc Af Amer: 53 mL/min/{1.73_m2} — ABNORMAL LOW (ref >59)
GFR calc non Af Amer: 46 mL/min/{1.73_m2} — ABNORMAL LOW (ref >59)
Glucose: 123 mg/dL — ABNORMAL HIGH (ref 65–99)
Potassium: 4.9 mmol/L (ref 3.5–5.2)
Sodium: 139 mmol/L (ref 134–144)

## 2019-07-28 LAB — MAGNESIUM: Magnesium: 2 mg/dL (ref 1.6–2.3)

## 2019-07-28 LAB — LIPID PANEL
Chol/HDL Ratio: 2.9 ratio (ref 0.0–5.0)
Cholesterol, Total: 154 mg/dL (ref 100–199)
HDL: 53 mg/dL (ref >39)
LDL Chol Calc (NIH): 82 mg/dL (ref 0–99)
Triglycerides: 103 mg/dL (ref 0–149)
VLDL Cholesterol Cal: 19 mg/dL (ref 5–40)

## 2019-07-28 LAB — HEMOGLOBIN A1C
Est. average glucose Bld gHb Est-mCnc: 151 mg/dL
Hgb A1c MFr Bld: 6.9 % — ABNORMAL HIGH (ref 4.8–5.6)

## 2019-07-28 LAB — MICROALBUMIN / CREATININE URINE RATIO
Creatinine, Urine: 53.8 mg/dL
Microalb/Creat Ratio: 115 mg/g creat — ABNORMAL HIGH (ref 0–29)
Microalbumin, Urine: 61.8 ug/mL

## 2019-07-28 LAB — PHOSPHORUS: Phosphorus: 3.6 mg/dL (ref 2.8–4.1)

## 2019-08-01 ENCOUNTER — Other Ambulatory Visit: Payer: Self-pay

## 2019-08-01 ENCOUNTER — Ambulatory Visit (INDEPENDENT_AMBULATORY_CARE_PROVIDER_SITE_OTHER): Payer: Medicare Other | Admitting: Family Medicine

## 2019-08-01 VITALS — BP 130/75 | Wt 215.0 lb

## 2019-08-01 DIAGNOSIS — N289 Disorder of kidney and ureter, unspecified: Secondary | ICD-10-CM | POA: Diagnosis not present

## 2019-08-01 DIAGNOSIS — I10 Essential (primary) hypertension: Secondary | ICD-10-CM

## 2019-08-01 DIAGNOSIS — E875 Hyperkalemia: Secondary | ICD-10-CM | POA: Diagnosis not present

## 2019-08-01 DIAGNOSIS — R6889 Other general symptoms and signs: Secondary | ICD-10-CM | POA: Diagnosis not present

## 2019-08-01 DIAGNOSIS — Z794 Long term (current) use of insulin: Secondary | ICD-10-CM | POA: Diagnosis not present

## 2019-08-01 DIAGNOSIS — E1140 Type 2 diabetes mellitus with diabetic neuropathy, unspecified: Secondary | ICD-10-CM

## 2019-08-01 NOTE — Progress Notes (Signed)
Subjective:    Patient ID: Jerry Burns, male    DOB: [birth year], 86 y.o.   MRN: [id number]  Diabetes He presents for his follow-up diabetic visit. He has type 2 diabetes mellitus. Pertinent negatives for hypoglycemia include no confusion, dizziness or headaches. Associated symptoms include fatigue. Pertinent negatives for diabetes include no chest pain. Risk factors for coronary artery disease include diabetes mellitus, dyslipidemia and hypertension. Current diabetic treatment includes insulin pump and oral agent (monotherapy). He is compliant with treatment all of the time. His weight is stable. He is following a diabetic diet. He has had a previous visit with a dietitian.  We did discuss his lab work with him Patient would like to discuss recent labs Results for orders placed or performed in visit on 07/10/19  Hemoglobin A1c  Result Value Ref Range   Hgb A1c MFr Bld 6.9 (H) 4.8 - 5.6 %   Est. average glucose Bld gHb Est-mCnc 151 mg/dL  Lipid Profile  Result Value Ref Range   Cholesterol, Total 154 100 - 199 mg/dL   Triglycerides 103 0 - 149 mg/dL   HDL 53 >39 mg/dL   VLDL Cholesterol Cal 19 5 - 40 mg/dL   LDL Chol Calc (NIH) 82 0 - 99 mg/dL   Chol/HDL Ratio 2.9 0.0 - 5.0 ratio  Basic Metabolic Panel (BMET)  Result Value Ref Range   Glucose 123 (H) 65 - 99 mg/dL   BUN 31 (H) 8 - 27 mg/dL   Creatinine, Ser 1.41 (H) 0.76 - 1.27 mg/dL   GFR calc non Af Amer 46 (L) >59 mL/min/1.73   GFR calc Af Amer 53 (L) >59 mL/min/1.73   BUN/Creatinine Ratio 22 10 - 24   Sodium 139 134 - 144 mmol/L   Potassium 4.9 3.5 - 5.2 mmol/L   Chloride 101 96 - 106 mmol/L   CO2 23 20 - 29 mmol/L   Calcium 9.6 8.6 - 10.2 mg/dL  Urine Microalbumin w/creat. ratio  Result Value Ref Range   Creatinine, Urine 53.8 Not Estab. mg/dL   Microalbumin, Urine 61.8 Not Estab. ug/mL   Microalb/Creat Ratio 115 (H) 0 - 29 mg/g creat    Patient states he has more trouble these days keeping his sugar levels good   Patient states 2 months ago he started having more than one BM a day 2-3 a day soft but not loose  Patient states he has noticed he is more cold sensitive these days.  Patient states his energy level is slightly lower than before   Virtual Visit via Video Note  I connected with [name] on 08/01/19 at  9:00 AM EST by a video enabled telemedicine application and verified that I am speaking with the correct person using two identifiers.  Location: Patient: home Provider: office   I discussed the limitations of evaluation and management by telemedicine and the availability of in person appointments. The patient expressed understanding and agreed to proceed.  History of Present Illness:    Observations/Objective:   Assessment and Plan:   Follow Up Instructions:    I discussed the assessment and treatment plan with the patient. The patient was provided an opportunity to ask questions and all were answered. The patient agreed with the plan and demonstrated an understanding of the instructions.   The patient was advised to call back or seek an in-person evaluation if the symptoms worsen or if the condition fails to improve as anticipated.  I provided 25 minutes of non-face-to-face time during  this encounter.   I do think it is reasonable for patient to repeat his lab work again in early spring we will look at his thyroid function also I have recommended that the patient reduce his diuretic from a full tablet to a half a tablet    Review of Systems  Constitutional: Positive for fatigue. Negative for diaphoresis.  HENT: Negative for congestion and rhinorrhea.   Respiratory: Negative for cough and shortness of breath.   Cardiovascular: Negative for chest pain and leg swelling.  Gastrointestinal: Negative for abdominal pain and diarrhea.  Musculoskeletal: Negative for arthralgias and back pain.  Skin: Negative for color change and rash.  Neurological: Negative for  dizziness and headaches.  Psychiatric/Behavioral: Negative for behavioral problems and confusion.   Fall Risk  05/01/2019 12/24/2016 08/27/2016 12/17/2015 09/18/2015  Falls in the past year? 0 No No No No  Risk for fall due to : - - - - -  Risk for fall due to: Comment - - - - -  Follow up Falls evaluation completed - - - -       Objective:   Physical Exam  Today's visit was via telephone Physical exam was not possible for this visit       Assessment & Plan:  Diabetes decent control continue current measures Renal insufficiency improved fluid intake Reduce torsemide to half a tablet Repeat again in 3 months Fatigue and tiredness check thyroid function await the results previous CBC looks good no need to repeat that Healthy eating regular physical activity Covid vaccine recommended when available 25 minutes was spent with patient

## 2019-08-07 ENCOUNTER — Ambulatory Visit: Payer: Medicare Other | Attending: Internal Medicine

## 2019-08-07 DIAGNOSIS — Z23 Encounter for immunization: Secondary | ICD-10-CM

## 2019-08-08 ENCOUNTER — Other Ambulatory Visit: Payer: Self-pay | Admitting: Family Medicine

## 2019-08-08 NOTE — Telephone Encounter (Signed)
Med check 08/01/19

## 2019-08-14 ENCOUNTER — Other Ambulatory Visit: Payer: Self-pay | Admitting: Family Medicine

## 2019-08-25 ENCOUNTER — Ambulatory Visit: Payer: Medicare Other

## 2019-08-26 ENCOUNTER — Ambulatory Visit: Payer: Medicare Other | Attending: Internal Medicine

## 2019-08-26 DIAGNOSIS — Z23 Encounter for immunization: Secondary | ICD-10-CM | POA: Insufficient documentation

## 2019-08-26 NOTE — Progress Notes (Signed)
   Covid-19 Vaccination Clinic  Name:  Jerry Burns    MRN: [id number] DOB: 08/08/1933  08/26/2019  [name] was observed post Covid-19 immunization for 15 minutes without incidence. He was provided with Vaccine Information Sheet and instruction to access the V-Safe system.   [name] was instructed to call 911 with any severe reactions post vaccine: Marland Kitchen Difficulty breathing  . Swelling of your face and throat  . A fast heartbeat  . A bad rash all over your body  . Dizziness and weakness    Immunizations Administered    Name Date Dose VIS Date Route   Pfizer COVID-19 Vaccine 08/26/2019  8:45 AM 0.3 mL 07/07/2019 Intramuscular   Manufacturer: [place]   Lot: [id number]   [place]: [id number]

## 2019-08-30 ENCOUNTER — Other Ambulatory Visit: Payer: Self-pay | Admitting: Family Medicine

## 2019-09-20 ENCOUNTER — Other Ambulatory Visit (HOSPITAL_COMMUNITY): Payer: Self-pay | Admitting: *Deleted

## 2019-09-20 DIAGNOSIS — D649 Anemia, unspecified: Secondary | ICD-10-CM

## 2019-09-21 ENCOUNTER — Inpatient Hospital Stay (HOSPITAL_COMMUNITY): Payer: Medicare Other | Attending: Hematology

## 2019-09-21 ENCOUNTER — Other Ambulatory Visit: Payer: Self-pay

## 2019-09-21 DIAGNOSIS — D649 Anemia, unspecified: Secondary | ICD-10-CM

## 2019-09-21 LAB — VITAMIN B12: Vitamin B-12: 1415 pg/mL — ABNORMAL HIGH (ref 180–914)

## 2019-09-21 LAB — COMPREHENSIVE METABOLIC PANEL
ALT: 17 U/L (ref 0–44)
AST: 21 U/L (ref 15–41)
Albumin: 4.2 g/dL (ref 3.5–5.0)
Alkaline Phosphatase: 71 U/L (ref 38–126)
Anion gap: 8 (ref 5–15)
BUN: 33 mg/dL — ABNORMAL HIGH (ref 8–23)
CO2: 27 mmol/L (ref 22–32)
Calcium: 9.2 mg/dL (ref 8.9–10.3)
Chloride: 103 mmol/L (ref 98–111)
Creatinine, Ser: 1.32 mg/dL — ABNORMAL HIGH (ref 0.61–1.24)
GFR calc Af Amer: 57 mL/min — ABNORMAL LOW (ref >60)
GFR calc non Af Amer: 49 mL/min — ABNORMAL LOW (ref >60)
Glucose, Bld: 157 mg/dL — ABNORMAL HIGH (ref 70–99)
Potassium: 4.5 mmol/L (ref 3.5–5.1)
Sodium: 138 mmol/L (ref 135–145)
Total Bilirubin: 0.7 mg/dL (ref 0.3–1.2)
Total Protein: 7.4 g/dL (ref 6.5–8.1)

## 2019-09-21 LAB — IRON AND TIBC
Iron: 77 ug/dL (ref 45–182)
Saturation Ratios: 28 % (ref 17.9–39.5)
TIBC: 273 ug/dL (ref 250–450)
UIBC: 196 ug/dL

## 2019-09-21 LAB — CBC WITH DIFFERENTIAL/PLATELET
Abs Immature Granulocytes: 0.01 10*3/uL (ref 0.00–0.07)
Basophils Absolute: 0.1 10*3/uL (ref 0.0–0.1)
Basophils Relative: 1 %
Eosinophils Absolute: 0.4 10*3/uL (ref 0.0–0.5)
Eosinophils Relative: 7 %
HCT: 33.7 % — ABNORMAL LOW (ref 39.0–52.0)
Hemoglobin: 11.1 g/dL — ABNORMAL LOW (ref 13.0–17.0)
Immature Granulocytes: 0 %
Lymphocytes Relative: 18 %
Lymphs Abs: 1.1 10*3/uL (ref 0.7–4.0)
MCH: 31.6 pg (ref 26.0–34.0)
MCHC: 32.9 g/dL (ref 30.0–36.0)
MCV: 96 fL (ref 80.0–100.0)
Monocytes Absolute: 0.7 10*3/uL (ref 0.1–1.0)
Monocytes Relative: 12 %
Neutro Abs: 3.7 10*3/uL (ref 1.7–7.7)
Neutrophils Relative %: 62 %
Platelets: 205 10*3/uL (ref 150–400)
RBC: 3.51 MIL/uL — ABNORMAL LOW (ref 4.22–5.81)
RDW: 12.6 % (ref 11.5–15.5)
WBC: 6 10*3/uL (ref 4.0–10.5)
nRBC: 0 % (ref 0.0–0.2)

## 2019-09-21 LAB — FERRITIN: Ferritin: 215 ng/mL (ref 24–336)

## 2019-09-21 LAB — VITAMIN D 25 HYDROXY (VIT D DEFICIENCY, FRACTURES): Vit D, 25-Hydroxy: 41.07 ng/mL (ref 30–100)

## 2019-09-21 LAB — LACTATE DEHYDROGENASE: LDH: 133 U/L (ref 98–192)

## 2019-09-21 LAB — FOLATE: Folate: 19.1 ng/mL (ref >5.9)

## 2019-09-23 ENCOUNTER — Other Ambulatory Visit: Payer: Self-pay | Admitting: Family Medicine

## 2019-09-27 ENCOUNTER — Inpatient Hospital Stay (HOSPITAL_COMMUNITY): Payer: Medicare Other | Attending: Hematology | Admitting: Nurse Practitioner

## 2019-09-27 ENCOUNTER — Other Ambulatory Visit: Payer: Self-pay

## 2019-09-27 ENCOUNTER — Encounter (HOSPITAL_COMMUNITY): Payer: Self-pay | Admitting: Nurse Practitioner

## 2019-09-27 DIAGNOSIS — I10 Essential (primary) hypertension: Secondary | ICD-10-CM | POA: Diagnosis not present

## 2019-09-27 DIAGNOSIS — Z87891 Personal history of nicotine dependence: Secondary | ICD-10-CM | POA: Diagnosis not present

## 2019-09-27 DIAGNOSIS — Z808 Family history of malignant neoplasm of other organs or systems: Secondary | ICD-10-CM | POA: Insufficient documentation

## 2019-09-27 DIAGNOSIS — Z794 Long term (current) use of insulin: Secondary | ICD-10-CM | POA: Insufficient documentation

## 2019-09-27 DIAGNOSIS — D649 Anemia, unspecified: Secondary | ICD-10-CM | POA: Insufficient documentation

## 2019-09-27 DIAGNOSIS — I824Z1 Acute embolism and thrombosis of unspecified deep veins of right distal lower extremity: Secondary | ICD-10-CM | POA: Diagnosis not present

## 2019-09-27 DIAGNOSIS — Z7901 Long term (current) use of anticoagulants: Secondary | ICD-10-CM | POA: Insufficient documentation

## 2019-09-27 DIAGNOSIS — Z800 Family history of malignant neoplasm of digestive organs: Secondary | ICD-10-CM | POA: Diagnosis not present

## 2019-09-27 NOTE — Patient Instructions (Addendum)
[place] at [facility]  Discharge Instructions: Follow up in 5 months with labs   You saw Francene Finders, NP, today. _______________________________________________________________  Thank you for choosing [place] at [facility] to provide your oncology and hematology care.  To afford each patient quality time with our providers, please arrive at least 15 minutes before your scheduled appointment.  You need to re-schedule your appointment if you arrive 10 or more minutes late.  We strive to give you quality time with our providers, and arriving late affects you and other patients whose appointments are after yours.  Also, if you no show three or more times for appointments you may be dismissed from the clinic.  Again, thank you for choosing [place] [facility] [place] hope is that these requests will allow you access to exceptional care and in a timely manner. _______________________________________________________________  If you have questions after your visit, please contact our office at [phone] between the hours of 8:30 a.m. and 5:00 p.m. Voicemails left after 4:30 p.m. will not be returned until the following business day. _______________________________________________________________  For prescription refill requests, have your pharmacy contact our office. _______________________________________________________________  Recommendations made by the consultant and any test results will be sent to your referring physician. _______________________________________________________________

## 2019-09-27 NOTE — Assessment & Plan Note (Addendum)
1.  Normocytic anemia: - Thought to be from GI blood loss. -He had a hemoglobin of 9.7 in September 2019 followed by a GI work-up. -He had an EGD on 05/18/2018 showed erosive reflux esophagitis, erosive gastropathy.  Biopsy was consistent with chronic active gastritis with H. pylori.  It was thought that he could have been bleeding from upper GI. -Colonoscopy on 05/18/2018 showed diverticulosis of the sigmoid colon, descending colon and transverse colon.  Tubular adenoma from hepatic flexure was resected. - He was admitted to the hospital on 09/02/2017 and diagnosed with a possible nonocclusive thrombus of the determinate acuity within 1 of the paired posterior tibial veins in the right calf.  He was started on Eliquis.  It was also found that he had a hemoglobin of 7.7.  Ferritin was 75 and percent saturation was 9. -He denies any history of blood transfusions.  Denies any B symptoms. - For his work-up his CBC was repeated hemoglobin showed 10.3.  Stool for occult blood x3 was negative.  Direct Coombs test and LDH were normal.  J94 and folic acid were normal. - His last Feraheme infusion was on 01/20/2019 and 01/30/2019. - Labs on 09/20/2018 showed his hemoglobin 11.1, ferritin 215, percent saturation 28, creatinine 1.32 potassium 4.5 -Due to the patient's extreme fatigue I have recommended 2 more infusions of IV iron.  He reports the iron infusions help with his fatigue last time. - He will follow-up in 5 months with repeat labs.  2.  Distal DVT: - He had an ultrasound on 09/03/2018 showed possible nonocclusive thrombus of the intermediate acuity within 1 of the posterior tibial veins in the right calf. - He is currently on Eliquis 5 mg twice daily and tolerating well.  That was started in February 2019. - A CT scan of the chest PE protocol could not be done as he is unable to lie flat at that time. -He reports he is now able to lie flat and take deep breaths. -He is no longer taking Eliquis.  He  reports it was stopped after 6 months.

## 2019-09-27 NOTE — Progress Notes (Signed)
Jerry Burns,  [id number]   CLINIC:  Medical Oncology/Hematology  PCP:  Jerry Drown, MD [address] [place] [address] [phone]   REASON FOR VISIT: Follow-up for iron deficiency anemia  CURRENT THERAPY: Intermittent iron infusions   INTERVAL HISTORY:  [name] 86 y.o. male returns for routine follow-up for iron deficiency anemia.  Patient reports he has been doing well since last visit.  He reports that his fatigue has returned.  He reports he is unable to complete tasks during the day due to his fatigue. Denies any nausea, vomiting, or diarrhea. Denies any new pains. Had not noticed any recent bleeding such as epistaxis, hematuria or hematochezia. Denies recent chest pain on exertion, shortness of breath on minimal exertion, pre-syncopal episodes, or palpitations. Denies any numbness or tingling in hands or feet. Denies any recent fevers, infections, or recent hospitalizations. Patient reports appetite at 75% and energy level at 50%.  He is eating well maintain his weight at this time.     REVIEW OF SYSTEMS:  Review of Systems  Constitutional: Positive for fatigue.  Respiratory: Positive for shortness of breath.   Cardiovascular: Positive for leg swelling.  Psychiatric/Behavioral: Positive for sleep disturbance.  All other systems reviewed and are negative.    PAST MEDICAL/SURGICAL HISTORY:  Past Medical History:  Diagnosis Date  . Anginal pain ([place])   . Arthritis    Bilateral knee  . Coronary artery disease   . Diabetes mellitus    insulin dependent   . DVT, lower extremity, distal, acute ([place]) 09/09/2018  . Hyperlipidemia   . Hypertension   . Progressive angina ([place]) 03/2015   a. s/p DES to proximal LAD and balloon angioplasty to distal LAD in 2016 b. patent stent by repeat cath in 01/2017 with mild residual CAD)  . Stroke [facility])    Past Surgical History:  Procedure Laterality Date  . APPENDECTOMY     . CARDIAC CATHETERIZATION N/A 04/12/2015   Procedure: Left Heart Cath and Coronary Angiography;  Surgeon: Sherren Mocha, MD; pLAD 75>0% w/ 3.0x12 mm Resolute DES, dLAD 80>0% w/ Angiosculpt scoring balloon, CFX luminal irreg, RCA mild dz, EF 55-65%   . COLONOSCOPY  2011   RMR: 1. Normal rectum 2. Left sided diverticula , 2 ascending colon polyps, status post snare polypectomy. Remainder of colonic mucosa appeared unremarkable.   . COLONOSCOPY N/A 05/13/2016   Procedure: COLONOSCOPY;  Surgeon: Daneil Dolin, MD;  [facility];  Service: Endoscopy;  Laterality: N/A;  7:30 AM  . COLONOSCOPY N/A 05/18/2018   Procedure: COLONOSCOPY;  Surgeon: Daneil Dolin, MD;  Location[facility];  Service: Endoscopy;  Laterality: N/A;  1:15pm  . CORONARY ANGIOGRAPHY N/A 02/04/2017   Procedure: CORONARY ANGIOGRAPHY (CATH LAB);  Surgeon: Troy Sine, MD;  Location: [facility] CV LAB;  Service: Cardiovascular;  Laterality: N/A;  . CORONARY ANGIOPLASTY WITH STENT PLACEMENT  08/22/2014   PTCA/DES X mLAD with 2.5 x 16 mm Promus Premier DES by Dr Julianne Handler  . ESOPHAGOGASTRODUODENOSCOPY N/A 05/18/2018   Procedure: ESOPHAGOGASTRODUODENOSCOPY (EGD);  Surgeon: Daneil Dolin, MD;  Location: [facility];  Service: Endoscopy;  Laterality: N/A;  . FOOT SURGERY Right   . LEFT HEART CATHETERIZATION WITH CORONARY ANGIOGRAM N/A 08/22/2014   Procedure: LEFT HEART CATHETERIZATION WITH CORONARY ANGIOGRAM;  Surgeon: Burnell Blanks, MD;  Location: [facility] CATH LAB;  Service: Cardiovascular;  Laterality: N/A;  . PERCUTANEOUS CORONARY STENT INTERVENTION (PCI-S)  08/22/2014   Procedure: PERCUTANEOUS CORONARY STENT INTERVENTION (PCI-S);  Surgeon: Burnell Blanks, MD;   . SHOULDER SURGERY    . TONSILLECTOMY       SOCIAL HISTORY:  Social History   Socioeconomic History  . Marital status: Married    Spouse name: Not on file  . Number of children: Not on file  . Years of education: 85  . Highest  education level: Not on file  Occupational History  . Occupation: retired  Tobacco Use  . Smoking status: Former Smoker    Packs/day: 1.00    Years: 20.00    Pack years: 20.00    Types: Cigarettes, Cigars    Start date: 08/10/1955    Quit date: 07/28/2007    Years since quitting: 12.1  . Smokeless tobacco: Never Used  Substance and Sexual Activity  . Alcohol use: Not Currently    Alcohol/week: 0.0 standard drinks    Comment: Daily about 1 glass of wine  . Drug use: No  . Sexual activity: Not Currently  Other Topics Concern  . Not on file  Social History Narrative  . Not on file   Social Determinants of Health   Financial Resource Strain:   . Difficulty of Paying Living Expenses: Not on file  Food Insecurity:   . Worried About Charity fundraiser in the Last Year: Not on file  . Ran Out of Food in the Last Year: Not on file  Transportation Needs:   . Lack of Transportation (Medical): Not on file  . Lack of Transportation (Non-Medical): Not on file  Physical Activity:   . Days of Exercise per Week: Not on file  . Minutes of Exercise per Session: Not on file  Stress:   . Feeling of Stress : Not on file  Social Connections:   . Frequency of Communication with Friends and Family: Not on file  . Frequency of Social Gatherings with Friends and Family: Not on file  . Attends Religious Services: Not on file  . Active Member of Clubs or Organizations: Not on file  . Attends Archivist Meetings: Not on file  . Marital Status: Not on file  Intimate Partner Violence:   . Fear of Current or Ex-Partner: Not on file  . Emotionally Abused: Not on file  . Physically Abused: Not on file  . Sexually Abused: Not on file    FAMILY HISTORY:  Family History  Problem Relation Age of Onset  . Diabetes Other   . Diabetes Brother   . Colon cancer Maternal Uncle   . Skin cancer Mother   . Diabetes Father   . Hypertension Sister   . Skin cancer Sister     CURRENT  MEDICATIONS:  Outpatient Encounter Medications as of 09/27/2019  Medication Sig  . amLODipine (NORVASC) 10 MG tablet TAKE (1) TABLET BY MOUTH ONCE DAILY.  Marland Kitchen Ascorbic Acid (VITAMIN C) 1000 MG tablet Take 500 mg by mouth daily.  Marland Kitchen atorvastatin (LIPITOR) 80 MG tablet TAKE (1) TABLET BY MOUTH ONCE DAILY.  . carvedilol (COREG) 12.5 MG tablet TAKE (1) TABLET BY MOUTH TWICE DAILY.  . cholecalciferol (VITAMIN D3) 25 MCG (1000 UT) tablet Take 1,000 Units by mouth daily.  . COMBIGAN 0.2-0.5 % ophthalmic solution Place 1 drop into the left eye 2 (two) times daily.   Marland Kitchen dutasteride (AVODART) 0.5 MG capsule TAKE ONE CAPSULE BY MOUTH ONCE DAILY.  Marland Kitchen Emollient (CERAVE) CREA Apply 1 application topically daily as needed (dry skin).  Marland Kitchen  erythromycin ophthalmic ointment APPLY A SMALL RIBBON INTOOTHE LEFT EYE 3 TIMES DAILY AS NEEDED.  Marland Kitchen Flaxseed, Linseed, (FLAXSEED OIL PO) Take by mouth daily.  Marland Kitchen glucose blood (CONTOUR NEXT TEST) test strip USE 1 STRIP TO CHECK GLUCOSE THREE TIMES DAILY. FOR ICD 10 E11.9  . LANTUS SOLOSTAR 100 UNIT/ML Solostar Pen INJECT 22 UNITS SUBCUTANEOUSLY ONCE DAILY AT 10PM  . metFORMIN (GLUCOPHAGE) 1000 MG tablet TAKE 1 TABLET BY MOUTH TWICE DAILY WITH A MEAL.  . mometasone (ELOCON) 0.1 % cream APPLY TO AFFECTED AREAS ONCE DAILY.  . Multiple Vitamin (MULTIVITAMIN) tablet Take 1 tablet by mouth daily.  . nitroGLYCERIN (NITROSTAT) 0.4 MG SL tablet PLACE 1 TAB UNDER TONGUE EVERY 5 MIN IF NEEDED FOR CHEST PAIN. MAY USE 3 TIMES.NO RELIEF CALL 911. (Patient not taking: Reported on 05/25/2019)  . pantoprazole (PROTONIX) 40 MG tablet TAKE ONE TABLET BY MOUTH ONCE DAILY.  . Polyvinyl Alcohol-Povidone (REFRESH OP) Place 1 drop into the left eye 2 (two) times daily.  . psyllium (METAMUCIL) 58.6 % powder Take 1 packet by mouth daily.   . sitaGLIPtin (JANUVIA) 100 MG tablet TAKE (1) TABLET BY MOUTH ONCE DAILY.  . tamsulosin (FLOMAX) 0.4 MG CAPS capsule TAKE (1) CAPSULE BY MOUTH ONCE DAILY.  Marland Kitchen torsemide  (DEMADEX) 20 MG tablet Take one tablet qam prn edema (Patient taking differently: 1/2 tablet every morning)  . Turmeric 500 MG CAPS Take 500 mg by mouth daily.  . [DISCONTINUED] REFRESH TEARS 0.5 % SOLN    No facility-administered encounter medications on file as of 09/27/2019.    ALLERGIES:  Allergies  Allergen Reactions  . Adhesive [Tape]     bandaids cause blisters after leaving on for 24 hours   . Sulfa Antibiotics Other (See Comments)    "was terrible" "eye went crazy"  . Latex Other (See Comments)    blisters     PHYSICAL EXAM:  ECOG Performance status: 1  Vitals:   09/27/19 0837  BP: 134/60  Pulse: 64  Resp: 20  Temp: (!) 97.1 F (36.2 C)  SpO2: 100%   Filed Weights   09/27/19 0837  Weight: 222 lb 6.4 oz (100.9 kg)    Physical Exam Constitutional:      Appearance: Normal appearance. He is normal weight.  Musculoskeletal:        General: Normal range of motion.  Skin:    General: Skin is warm.  Neurological:     Mental Status: He is alert and oriented to person, place, and time. Mental status is at baseline.  Psychiatric:        Mood and Affect: Mood normal.        Behavior: Behavior normal.        Thought Content: Thought content normal.        Judgment: Judgment normal.      LABORATORY DATA:  I have reviewed the labs as listed.  CBC    Component Value Date/Time   WBC 6.0 09/21/2019 0849   RBC 3.51 (L) 09/21/2019 0849   HGB 11.1 (L) 09/21/2019 0849   HGB 9.9 (L) 10/06/2018 0844   HCT 33.7 (L) 09/21/2019 0849   HCT 29.8 (L) 10/06/2018 0844   PLT 205 09/21/2019 0849   PLT 260 10/06/2018 0844   MCV 96.0 09/21/2019 0849   MCV 81 10/06/2018 0844   MCH 31.6 09/21/2019 0849   MCHC 32.9 09/21/2019 0849   RDW 12.6 09/21/2019 0849   RDW 13.9 10/06/2018 0844   LYMPHSABS 1.1 09/21/2019 0849  LYMPHSABS 1.1 10/06/2018 0844   MONOABS 0.7 09/21/2019 0849   EOSABS 0.4 09/21/2019 0849   EOSABS 0.5 (H) 10/06/2018 0844   BASOSABS 0.1 09/21/2019 0849    BASOSABS 0.1 10/06/2018 0844   CMP Latest Ref Rng & Units 09/21/2019 07/27/2019 06/05/2019  Glucose 70 - 99 mg/dL 157(H) 123(H) 147(H)  BUN 8 - 23 mg/dL 33(H) 31(H) 31(H)  Creatinine 0.61 - 1.24 mg/dL 1.32(H) 1.41(H) 1.41(H)  Sodium 135 - 145 mmol/L 138 139 139  Potassium 3.5 - 5.1 mmol/L 4.5 4.9 5.6(H)  Chloride 98 - 111 mmol/L 103 101 100  CO2 22 - 32 mmol/L 27 23 25   Calcium 8.9 - 10.3 mg/dL 9.2 9.6 9.7  Total Protein 6.5 - 8.1 g/dL 7.4 - -  Total Bilirubin 0.3 - 1.2 mg/dL 0.7 - -  Alkaline Phos 38 - 126 U/L 71 - -  AST 15 - 41 U/L 21 - -  ALT 0 - 44 U/L 17 - -    I personally performed a face-to-face visit.  All questions were answered to patient's stated satisfaction. Encouraged patient to call with any new concerns or questions before his next visit to the cancer center and we can certain see him sooner, if needed.     ASSESSMENT & PLAN:   Normochromic normocytic anemia 1.  Normocytic anemia: - Thought to be from GI blood loss. -He had a hemoglobin of 9.7 in September 2019 followed by a GI work-up. -He had an EGD on 05/18/2018 showed erosive reflux esophagitis, erosive gastropathy.  Biopsy was consistent with chronic active gastritis with H. pylori.  It was thought that he could have been bleeding from upper GI. -Colonoscopy on 05/18/2018 showed diverticulosis of the sigmoid colon, descending colon and transverse colon.  Tubular adenoma from hepatic flexure was resected. - He was admitted to the hospital on 09/02/2017 and diagnosed with a possible nonocclusive thrombus of the determinate acuity within 1 of the paired posterior tibial veins in the right calf.  He was started on Eliquis.  It was also found that he had a hemoglobin of 7.7.  Ferritin was 75 and percent saturation was 9. -He denies any history of blood transfusions.  Denies any B symptoms. - For his work-up his CBC was repeated hemoglobin showed 10.3.  Stool for occult blood x3 was negative.  Direct Coombs test and LDH  were normal.  W10 and folic acid were normal. - His last Feraheme infusion was on 01/20/2019 and 01/30/2019. - Labs on 09/20/2018 showed his hemoglobin 11.1, ferritin 215, percent saturation 28, creatinine 1.32 potassium 4.5 -Due to the patient's extreme fatigue I have recommended 2 more infusions of IV iron.  He reports the iron infusions help with his fatigue last time. - He will follow-up in 5 months with repeat labs.  2.  Distal DVT: - He had an ultrasound on 09/03/2018 showed possible nonocclusive thrombus of the intermediate acuity within 1 of the posterior tibial veins in the right calf. - He is currently on Eliquis 5 mg twice daily and tolerating well.  That was started in February 2019. - A CT scan of the chest PE protocol could not be done as he is unable to lie flat at that time. -He reports he is now able to lie flat and take deep breaths. -He is no longer taking Eliquis.  He reports it was stopped after 6 months.      Orders placed this encounter:  Orders Placed This Encounter  Procedures  . Lactate dehydrogenase  .  CBC with Differential/Platelet  . Comprehensive metabolic panel  . Ferritin  . Iron and TIBC  . Vitamin B12  . VITAMIN D 25 Hydroxy (Vit-D Deficiency, Fractures)  . Folate      Francene Finders, FNP-C [place] [phone]

## 2019-09-28 ENCOUNTER — Ambulatory Visit (HOSPITAL_COMMUNITY): Payer: Medicare Other | Admitting: Nurse Practitioner

## 2019-10-04 ENCOUNTER — Encounter: Payer: Self-pay | Admitting: Family Medicine

## 2019-10-04 NOTE — Telephone Encounter (Signed)
Contacted patient. Pt states that since Sunday he has had vertigo, light headedness and dizziness. Blood pressure has been running about 160/75. No headache, no vision issues, no chest pain. Spoke with provider and provider states that if pt is having severe issues, he will need to see Dr.Steve (due to no available slots for PCP) or if pt is doing ok, he may have an appt tomorrow morning with PCP. Pt states he would like to have an appointment for in the morning. Advised pt that if he developed worsening symptoms, he would need to go to ER to be checked out. Pt verbalized understanding and transferred up front to set up appt.

## 2019-10-05 ENCOUNTER — Other Ambulatory Visit: Payer: Self-pay

## 2019-10-05 ENCOUNTER — Encounter: Payer: Self-pay | Admitting: Family Medicine

## 2019-10-05 ENCOUNTER — Ambulatory Visit (INDEPENDENT_AMBULATORY_CARE_PROVIDER_SITE_OTHER): Payer: Medicare Other | Admitting: Family Medicine

## 2019-10-05 VITALS — BP 132/86 | Temp 97.1°F | Wt 223.4 lb

## 2019-10-05 DIAGNOSIS — N1831 Chronic kidney disease, stage 3a: Secondary | ICD-10-CM | POA: Diagnosis not present

## 2019-10-05 DIAGNOSIS — I951 Orthostatic hypotension: Secondary | ICD-10-CM

## 2019-10-05 DIAGNOSIS — E1121 Type 2 diabetes mellitus with diabetic nephropathy: Secondary | ICD-10-CM | POA: Diagnosis not present

## 2019-10-05 DIAGNOSIS — I10 Essential (primary) hypertension: Secondary | ICD-10-CM | POA: Diagnosis not present

## 2019-10-05 DIAGNOSIS — E1140 Type 2 diabetes mellitus with diabetic neuropathy, unspecified: Secondary | ICD-10-CM

## 2019-10-05 DIAGNOSIS — Z794 Long term (current) use of insulin: Secondary | ICD-10-CM | POA: Diagnosis not present

## 2019-10-05 MED ORDER — AMLODIPINE BESYLATE 5 MG PO TABS: ORAL_TABLET | ORAL | 1 refills | Status: DC

## 2019-10-05 NOTE — Progress Notes (Signed)
Subjective:    Patient ID: Jerry Burns, male    DOB: 10/27/1933, 86 y.o.   MRN: [id number]  Hypertension This is a chronic problem. Pertinent negatives include no chest pain, headaches or shortness of breath. (Fatigue, 8 lb weight gain in 4 months, vertigo, lightheadedness, dizziness) There are no compliance problems.   Pt states he has changed his diet and is now eating more sausages for breakfast. Pt used to eat oatmeal every morning.  Very nice patient Having some significant troubles.  Feels like the room is turning on him when he gets up in the morning Pt checks blood pressure about 3 times a week.   Patient relates that his blood pressures at times running high other times okay States when he gets up he feels woozy or dizzy sometimes feels like he has had a fall over Denies any chest pressure tightness or shortness of breath but does state he gets out of energy fairly easily is being followed by multiple specialists and has had lab work done plus iron infusions.  Patient is also on multiple medications.  Noticing some pedal edema. Results for orders placed or performed in visit on 09/21/19  CBC with Differential  Result Value Ref Range   WBC 6.0 4.0 - 10.5 K/uL   RBC 3.51 (L) 4.22 - 5.81 MIL/uL   Hemoglobin 11.1 (L) 13.0 - 17.0 g/dL   HCT 33.7 (L) 39.0 - 52.0 %   MCV 96.0 80.0 - 100.0 fL   MCH 31.6 26.0 - 34.0 pg   MCHC 32.9 30.0 - 36.0 g/dL   RDW 12.6 11.5 - 15.5 %   Platelets 205 150 - 400 K/uL   nRBC 0.0 0.0 - 0.2 %   Neutrophils Relative % 62 %   Neutro Abs 3.7 1.7 - 7.7 K/uL   Lymphocytes Relative 18 %   Lymphs Abs 1.1 0.7 - 4.0 K/uL   Monocytes Relative 12 %   Monocytes Absolute 0.7 0.1 - 1.0 K/uL   Eosinophils Relative 7 %   Eosinophils Absolute 0.4 0.0 - 0.5 K/uL   Basophils Relative 1 %   Basophils Absolute 0.1 0.0 - 0.1 K/uL   Immature Granulocytes 0 %   Abs Immature Granulocytes 0.01 0.00 - 0.07 K/uL  Comprehensive metabolic panel  Result Value Ref  Range   Sodium 138 135 - 145 mmol/L   Potassium 4.5 3.5 - 5.1 mmol/L   Chloride 103 98 - 111 mmol/L   CO2 27 22 - 32 mmol/L   Glucose, Bld 157 (H) 70 - 99 mg/dL   BUN 33 (H) 8 - 23 mg/dL   Creatinine, Ser 1.32 (H) 0.61 - 1.24 mg/dL   Calcium 9.2 8.9 - 10.3 mg/dL   Total Protein 7.4 6.5 - 8.1 g/dL   Albumin 4.2 3.5 - 5.0 g/dL   AST 21 15 - 41 U/L   ALT 17 0 - 44 U/L   Alkaline Phosphatase 71 38 - 126 U/L   Total Bilirubin 0.7 0.3 - 1.2 mg/dL   GFR calc non Af Amer 49 (L) >60 mL/min   GFR calc Af Amer 57 (L) >60 mL/min   Anion gap 8 5 - 15  Lactate dehydrogenase  Result Value Ref Range   LDH 133 98 - 192 U/L  Folate  Result Value Ref Range   Folate 19.1 >5.9 ng/mL  Vitamin B12  Result Value Ref Range   Vitamin B-12 1,415 (H) 180 - 914 pg/mL  Ferritin  Result Value Ref Range  Ferritin 215 24 - 336 ng/mL  Iron and TIBC  Result Value Ref Range   Iron 77 45 - 182 ug/dL   TIBC 273 250 - 450 ug/dL   Saturation Ratios 28 17.9 - 39.5 %   UIBC 196 ug/dL  Vitamin D 25 hydroxy  Result Value Ref Range   Vit D, 25-Hydroxy 41.07 30 - 100 ng/mL   Lab work has been reviewed with patient in detail  Review of Systems  Constitutional: Negative for diaphoresis and fatigue.  HENT: Negative for congestion and rhinorrhea.   Respiratory: Negative for cough and shortness of breath.   Cardiovascular: Negative for chest pain and leg swelling.  Gastrointestinal: Negative for abdominal pain and diarrhea.  Skin: Negative for color change and rash.  Neurological: Positive for dizziness. Negative for headaches.  Psychiatric/Behavioral: Negative for behavioral problems and confusion.       Objective:   Physical Exam Vitals reviewed.  Constitutional:      General: He is not in acute distress. HENT:     Head: Normocephalic and atraumatic.  Eyes:     General:        Right eye: No discharge.        Left eye: No discharge.  Neck:     Trachea: No tracheal deviation.  Cardiovascular:      Rate and Rhythm: Normal rate and regular rhythm.     Heart sounds: Normal heart sounds. No murmur.  Pulmonary:     Effort: Pulmonary effort is normal. No respiratory distress.     Breath sounds: Normal breath sounds.  Lymphadenopathy:     Cervical: No cervical adenopathy.  Skin:    General: Skin is warm and dry.  Neurological:     Mental Status: He is alert.     Coordination: Coordination normal.  Psychiatric:        Behavior: Behavior normal.    Pedal edema noted 30 minutes spent with patient between time of patient evaluation and documentation and review of chart      Assessment & Plan:  1. Orthostatic hypotension So blood pressure was checked lying sitting standing dramatic drop with standing therefore reduce amlodipine new dose 5 mg instead of 10 mg continue other medication continue salt as necessary but not over usage very important to be careful with standing do not get on ladders currently  2. Essential hypertension Blood pressure decent control currently I would not add any medicines what he is doing  3. Type 2 diabetes mellitus with diabetic neuropathy, with long-term current use of insulin ([place]) Diabetes decent control but patient was warned regarding low sugars and also was told that his A1c where it is it is reasonable we will not try to get his A1c down to a 5.5 or 6-he had a family member who was talking to him about that  4. Type 2 diabetes mellitus with stage 3a chronic kidney disease, with long-term current use of insulin ([place])  Patient will have kidney function monitored closely should he get worse nephrology consult

## 2019-10-06 ENCOUNTER — Encounter (HOSPITAL_COMMUNITY): Payer: Self-pay

## 2019-10-06 ENCOUNTER — Inpatient Hospital Stay (HOSPITAL_COMMUNITY): Payer: Medicare Other

## 2019-10-06 VITALS — BP 134/49 | HR 60 | Temp 97.8°F | Resp 18

## 2019-10-06 DIAGNOSIS — Z7901 Long term (current) use of anticoagulants: Secondary | ICD-10-CM | POA: Diagnosis not present

## 2019-10-06 DIAGNOSIS — D649 Anemia, unspecified: Secondary | ICD-10-CM

## 2019-10-06 DIAGNOSIS — Z87891 Personal history of nicotine dependence: Secondary | ICD-10-CM | POA: Diagnosis not present

## 2019-10-06 DIAGNOSIS — I10 Essential (primary) hypertension: Secondary | ICD-10-CM | POA: Diagnosis not present

## 2019-10-06 DIAGNOSIS — Z794 Long term (current) use of insulin: Secondary | ICD-10-CM | POA: Diagnosis not present

## 2019-10-06 DIAGNOSIS — I824Z1 Acute embolism and thrombosis of unspecified deep veins of right distal lower extremity: Secondary | ICD-10-CM | POA: Diagnosis not present

## 2019-10-06 MED ORDER — SODIUM CHLORIDE 0.9 % IV SOLN
510.0000 mg | Freq: Once | INTRAVENOUS | Status: AC
  Administered 2019-10-06: 510 mg via INTRAVENOUS
  Filled 2019-10-06: qty 510

## 2019-10-06 MED ORDER — SODIUM CHLORIDE 0.9 % IV SOLN: INTRAVENOUS | Status: DC

## 2019-10-06 MED ORDER — SODIUM CHLORIDE 0.9% FLUSH: 10.0000 mL | Freq: Once | INTRAVENOUS | Status: DC | PRN

## 2019-10-06 MED ORDER — SODIUM CHLORIDE 0.9% FLUSH: 3.0000 mL | Freq: Once | INTRAVENOUS | Status: DC | PRN

## 2019-10-06 NOTE — Progress Notes (Signed)
Patient presents today for Feraheme infusion. MAR reviewed and updated. Patient has no complaints of any changes since his last visit.   Treatment given today per MD orders. Tolerated infusion without adverse affects. Vital signs stable. No complaints at this time. Discharged from clinic ambulatory. F/U with [facility] as scheduled.

## 2019-10-06 NOTE — Patient Instructions (Signed)
[facility] at [facility]  Discharge Instructions:   _______________________________________________________________  Thank you for choosing [facility] at [facility] to provide your oncology and hematology care.  To afford each patient quality time with our providers, please arrive at least 15 minutes before your scheduled appointment.  You need to re-schedule your appointment if you arrive 10 or more minutes late.  We strive to give you quality time with our providers, and arriving late affects you and other patients whose appointments are after yours.  Also, if you no show three or more times for appointments you may be dismissed from the clinic.  Again, thank you for choosing [facility] at [facility]. Our hope is that these requests will allow you access to exceptional care and in a timely manner. _______________________________________________________________  If you have questions after your visit, please contact our office at [phone] between the hours of 8:30 a.m. and 5:00 p.m. Voicemails left after 4:30 p.m. will not be returned until the following business day. _______________________________________________________________  For prescription refill requests, have your pharmacy contact our office. _______________________________________________________________  Recommendations made by the consultant and any test results will be sent to your referring physician. _______________________________________________________________ 

## 2019-10-13 ENCOUNTER — Inpatient Hospital Stay (HOSPITAL_COMMUNITY): Payer: Medicare Other

## 2019-10-13 ENCOUNTER — Other Ambulatory Visit: Payer: Self-pay

## 2019-10-13 ENCOUNTER — Encounter (HOSPITAL_COMMUNITY): Payer: Self-pay

## 2019-10-13 VITALS — BP 137/55 | HR 66 | Temp 97.5°F | Resp 18

## 2019-10-13 DIAGNOSIS — Z7901 Long term (current) use of anticoagulants: Secondary | ICD-10-CM | POA: Diagnosis not present

## 2019-10-13 DIAGNOSIS — D649 Anemia, unspecified: Secondary | ICD-10-CM | POA: Diagnosis not present

## 2019-10-13 DIAGNOSIS — I10 Essential (primary) hypertension: Secondary | ICD-10-CM | POA: Diagnosis not present

## 2019-10-13 DIAGNOSIS — Z794 Long term (current) use of insulin: Secondary | ICD-10-CM | POA: Diagnosis not present

## 2019-10-13 DIAGNOSIS — Z87891 Personal history of nicotine dependence: Secondary | ICD-10-CM | POA: Diagnosis not present

## 2019-10-13 DIAGNOSIS — I824Z1 Acute embolism and thrombosis of unspecified deep veins of right distal lower extremity: Secondary | ICD-10-CM | POA: Diagnosis not present

## 2019-10-13 MED ORDER — SODIUM CHLORIDE 0.9 % IV SOLN
510.0000 mg | Freq: Once | INTRAVENOUS | Status: AC
  Administered 2019-10-13: 510 mg via INTRAVENOUS
  Filled 2019-10-13: qty 510

## 2019-10-13 MED ORDER — SODIUM CHLORIDE 0.9 % IV SOLN: Freq: Once | INTRAVENOUS | Status: AC

## 2019-10-13 NOTE — Progress Notes (Signed)
Patient presents today for Feraheme infusion. MAR reviewed. Vital signs stable. Patient has no complaints of any pain today. Patient has no complaints of any changes since his last visit.   Feraheme given today per MD orders. Tolerated infusion without adverse affects. Vital signs stable. No complaints at this time. Discharged from clinic ambulatory. F/U with [facility] as scheduled.

## 2019-10-13 NOTE — Patient Instructions (Signed)
[facility] at [facility]  Discharge Instructions:   _______________________________________________________________  Thank you for choosing [facility] at [facility] to provide your oncology and hematology care.  To afford each patient quality time with our providers, please arrive at least 15 minutes before your scheduled appointment.  You need to re-schedule your appointment if you arrive 10 or more minutes late.  We strive to give you quality time with our providers, and arriving late affects you and other patients whose appointments are after yours.  Also, if you no show three or more times for appointments you may be dismissed from the clinic.  Again, thank you for choosing [facility] at [facility]. Our hope is that these requests will allow you access to exceptional care and in a timely manner. _______________________________________________________________  If you have questions after your visit, please contact our office at [phone] between the hours of 8:30 a.m. and 5:00 p.m. Voicemails left after 4:30 p.m. will not be returned until the following business day. _______________________________________________________________  For prescription refill requests, have your pharmacy contact our office. _______________________________________________________________  Recommendations made by the consultant and any test results will be sent to your referring physician. _______________________________________________________________ 

## 2019-10-26 DIAGNOSIS — R6889 Other general symptoms and signs: Secondary | ICD-10-CM | POA: Diagnosis not present

## 2019-10-26 DIAGNOSIS — E875 Hyperkalemia: Secondary | ICD-10-CM | POA: Diagnosis not present

## 2019-10-26 DIAGNOSIS — Z794 Long term (current) use of insulin: Secondary | ICD-10-CM | POA: Diagnosis not present

## 2019-10-26 DIAGNOSIS — E1140 Type 2 diabetes mellitus with diabetic neuropathy, unspecified: Secondary | ICD-10-CM | POA: Diagnosis not present

## 2019-10-27 LAB — BASIC METABOLIC PANEL
BUN/Creatinine Ratio: 18 (ref 10–24)
BUN: 24 mg/dL (ref 8–27)
CO2: 24 mmol/L (ref 20–29)
Calcium: 9.6 mg/dL (ref 8.6–10.2)
Chloride: 101 mmol/L (ref 96–106)
Creatinine, Ser: 1.36 mg/dL — ABNORMAL HIGH (ref 0.76–1.27)
GFR calc Af Amer: 55 mL/min/{1.73_m2} — ABNORMAL LOW (ref >59)
GFR calc non Af Amer: 47 mL/min/{1.73_m2} — ABNORMAL LOW (ref >59)
Glucose: 159 mg/dL — ABNORMAL HIGH (ref 65–99)
Potassium: 4.8 mmol/L (ref 3.5–5.2)
Sodium: 139 mmol/L (ref 134–144)

## 2019-10-27 LAB — TSH: TSH: 3.36 u[IU]/mL (ref 0.450–4.500)

## 2019-10-27 LAB — HEMOGLOBIN A1C
Est. average glucose Bld gHb Est-mCnc: 148 mg/dL
Hgb A1c MFr Bld: 6.8 % — ABNORMAL HIGH (ref 4.8–5.6)

## 2019-10-27 LAB — T4, FREE: Free T4: 1.34 ng/dL (ref 0.82–1.77)

## 2019-11-02 ENCOUNTER — Other Ambulatory Visit: Payer: Self-pay

## 2019-11-02 ENCOUNTER — Ambulatory Visit (INDEPENDENT_AMBULATORY_CARE_PROVIDER_SITE_OTHER): Payer: Medicare Other | Admitting: Family Medicine

## 2019-11-02 ENCOUNTER — Encounter: Payer: Self-pay | Admitting: Family Medicine

## 2019-11-02 VITALS — BP 122/70 | Temp 96.8°F | Wt 218.8 lb

## 2019-11-02 DIAGNOSIS — I10 Essential (primary) hypertension: Secondary | ICD-10-CM

## 2019-11-02 DIAGNOSIS — N401 Enlarged prostate with lower urinary tract symptoms: Secondary | ICD-10-CM | POA: Diagnosis not present

## 2019-11-02 DIAGNOSIS — N1831 Chronic kidney disease, stage 3a: Secondary | ICD-10-CM

## 2019-11-02 DIAGNOSIS — E785 Hyperlipidemia, unspecified: Secondary | ICD-10-CM | POA: Diagnosis not present

## 2019-11-02 DIAGNOSIS — E1169 Type 2 diabetes mellitus with other specified complication: Secondary | ICD-10-CM | POA: Diagnosis not present

## 2019-11-02 DIAGNOSIS — R3912 Poor urinary stream: Secondary | ICD-10-CM

## 2019-11-02 DIAGNOSIS — Z794 Long term (current) use of insulin: Secondary | ICD-10-CM

## 2019-11-02 DIAGNOSIS — E1121 Type 2 diabetes mellitus with diabetic nephropathy: Secondary | ICD-10-CM | POA: Diagnosis not present

## 2019-11-02 DIAGNOSIS — E1122 Type 2 diabetes mellitus with diabetic chronic kidney disease: Secondary | ICD-10-CM

## 2019-11-02 MED ORDER — METFORMIN HCL 500 MG PO TABS: ORAL_TABLET | ORAL | 1 refills | Status: DC

## 2019-11-02 MED ORDER — FAMOTIDINE 20 MG PO TABS: ORAL_TABLET | ORAL | 1 refills | Status: DC

## 2019-11-02 NOTE — Progress Notes (Signed)
   Subjective:    Patient ID: Jerry Burns, male    DOB: [birth year], 86 y.o.   MRN: [id number] Very nice patient HPI Pt here today for follow up. Pt states he is still has some lethargy and feeling "wimpy".  Patient denies being dizzy currently Patient relates intermittently feeling tired fatigued gives out of energy energy easily Pt has all meds with him today and would like to know if he can stop taking any of the medications.  I reviewed over every single medication and vitamin supplement he is taking denies any type of chest tightness pressure pain Review of Systems Fatigue tiredness gives out of energy otherwise doing okay    Objective:   Physical Exam Lungs clear respiratory rate normal heart regular no murmurs abdomen is soft no masses extremities no edema skin warm dry       Assessment & Plan:  1. Essential hypertension Blood pressure under good control blood pressure laying sitting standing under good control no dizziness - Hemoglobin Z0Y - Basic Metabolic Panel (BMET) - Lipid Profile  2. Type 2 diabetes mellitus with stage 3a chronic kidney disease, with long-term current use of insulin (HCC) A1c actually looks good but I am concerned about his kidney function and GFR we reviewed over all of his medications today we spent 30 minutes discussing medication approaches for his blood pressure diabetes hyperlipidemia plus also multiple vitamin supplements that he is taking we were able to eliminate some of the supplements as well as reduce some of his other medicines Because he is not having any trouble with reflux recently and has not had any more GI bleed with discussion the patient desires and agrees to stop pantoprazole We will utilize Pepcid on a daily basis   Also because his GFR is in the mid 40s we will reduce the Metformin to 500 mg twice daily and the patient will send [place] some glucose readings in a few weeks time plus we will do lab work follow-up in 3 months with  follow-up office visit - Hemoglobin F7C - Basic Metabolic Panel (BMET) - Lipid Profile  3. Benign prostatic hyperplasia with weak urinary stream Urination doing well we will stick with the current medicines - Hemoglobin B4W - Basic Metabolic Panel (BMET) - Lipid Profile  4. Hyperlipidemia associated with type 2 diabetes mellitus ([place]) Cholesterols looked well in the past we will stick with current medicine - Hemoglobin H6P - Basic Metabolic Panel (BMET) - Lipid Profile 30 minutes spent in face-to-face discussion of multiple medications and supplements and documentation in reviewing of records with patient

## 2019-11-13 ENCOUNTER — Other Ambulatory Visit: Payer: Self-pay | Admitting: Internal Medicine

## 2019-11-13 ENCOUNTER — Other Ambulatory Visit: Payer: Self-pay | Admitting: Student

## 2019-11-14 ENCOUNTER — Other Ambulatory Visit: Payer: Self-pay | Admitting: Family Medicine

## 2019-11-23 ENCOUNTER — Encounter: Payer: Self-pay | Admitting: Family Medicine

## 2019-11-24 ENCOUNTER — Ambulatory Visit (INDEPENDENT_AMBULATORY_CARE_PROVIDER_SITE_OTHER): Payer: Medicare Other | Admitting: Family Medicine

## 2019-11-24 ENCOUNTER — Encounter: Payer: Self-pay | Admitting: Family Medicine

## 2019-11-24 ENCOUNTER — Other Ambulatory Visit: Payer: Self-pay

## 2019-11-24 VITALS — BP 138/82 | Temp 97.4°F | Wt 223.8 lb

## 2019-11-24 DIAGNOSIS — E1165 Type 2 diabetes mellitus with hyperglycemia: Secondary | ICD-10-CM

## 2019-11-24 DIAGNOSIS — Z794 Long term (current) use of insulin: Secondary | ICD-10-CM | POA: Diagnosis not present

## 2019-11-24 DIAGNOSIS — R5383 Other fatigue: Secondary | ICD-10-CM

## 2019-11-24 MED ORDER — INSULIN LISPRO (1 UNIT DIAL) 100 UNIT/ML (KWIKPEN): PEN_INJECTOR | SUBCUTANEOUS | 5 refills | Status: DC

## 2019-11-24 NOTE — Progress Notes (Signed)
   Subjective:    Patient ID: Jerry Burns, male    DOB: 1934-05-10, 86 y.o.   MRN: [id number]  HPI Pt here today due to fatigue. Fatigue started last Sunday. Pt is irritated because he is unable to do things he wants to, tired, lost of appetite but is gaining weight, brain fog, thought process is not where it used to be, dizziness, some lightheadedness.  Pt is having elevated blood sugar also. Since last Sunday. Pt was cutting back on carbs and blood sugars were doing ok. Pt had carbs (1/2 cup spaghetti and stew chicken and a cup of yogurt) last night and sugar went to 267. Pt took blood sugar 3 hours after eating supper last night. Pt has readings with him today.  I reviewed over the letter he sent I also reviewed over the readings I also reviewed over today's readings and we did discuss how long insulin works and how short insulin works and offered adding short insulin versus adding another oral agent  More than likely his elevated glucose readings are related to the fact that he had his Metformin cut back because of low GFR His significant fatigue and tiredness was related to the fact he drastically cut back carbohydrates.  Review of Systems  Constitutional: Negative for activity change.  HENT: Negative for congestion and rhinorrhea.   Respiratory: Negative for cough and shortness of breath.   Cardiovascular: Negative for chest pain.  Gastrointestinal: Negative for abdominal pain, diarrhea, nausea and vomiting.  Genitourinary: Negative for dysuria and hematuria.  Neurological: Negative for weakness and headaches.  Psychiatric/Behavioral: Negative for behavioral problems and confusion.       Objective:   Physical Exam Lungs are clear respiratory rate normal heart regular no murmurs extremities no edema   Significant time spent with patient educating him regarding the insulin as well as educating him regarding the fatigue and tiredness as a result of too low of carbohydrates and we  also educated him regarding what low sugars would look like and how to adjust insulin call us if any problems    Assessment & Plan:  Subpar diabetes control Continue current measures Utilize 25 units of long-acting insulin each evening Utilize short acting insulin on a sliding scale 2 units in the morning 3 units at supper may increase to a maximum of 4 units in the morning and 5 units at supper if his glucose readings are elevated Follow-up for recheck next week bring all readings with him

## 2019-11-26 ENCOUNTER — Emergency Department (HOSPITAL_COMMUNITY): Payer: Medicare Other

## 2019-11-26 ENCOUNTER — Emergency Department (HOSPITAL_COMMUNITY)
Admission: EM | Admit: 2019-11-26 | Discharge: 2019-11-26 | Disposition: A | Discharge status: Home / Self Care | Payer: Medicare Other | Attending: Emergency Medicine | Admitting: Emergency Medicine

## 2019-11-26 ENCOUNTER — Encounter (HOSPITAL_COMMUNITY): Payer: Self-pay | Admitting: *Deleted

## 2019-11-26 ENCOUNTER — Other Ambulatory Visit: Payer: Self-pay

## 2019-11-26 DIAGNOSIS — I4892 Unspecified atrial flutter: Secondary | ICD-10-CM | POA: Diagnosis not present

## 2019-11-26 DIAGNOSIS — I130 Hypertensive heart and chronic kidney disease with heart failure and stage 1 through stage 4 chronic kidney disease, or unspecified chronic kidney disease: Secondary | ICD-10-CM | POA: Diagnosis not present

## 2019-11-26 DIAGNOSIS — Z91040 Latex allergy status: Secondary | ICD-10-CM | POA: Insufficient documentation

## 2019-11-26 DIAGNOSIS — R0602 Shortness of breath: Secondary | ICD-10-CM

## 2019-11-26 DIAGNOSIS — R140 Abdominal distension (gaseous): Secondary | ICD-10-CM | POA: Diagnosis not present

## 2019-11-26 DIAGNOSIS — E1122 Type 2 diabetes mellitus with diabetic chronic kidney disease: Secondary | ICD-10-CM | POA: Insufficient documentation

## 2019-11-26 DIAGNOSIS — N183 Chronic kidney disease, stage 3 unspecified: Secondary | ICD-10-CM | POA: Insufficient documentation

## 2019-11-26 DIAGNOSIS — K2980 Duodenitis without bleeding: Secondary | ICD-10-CM | POA: Diagnosis not present

## 2019-11-26 DIAGNOSIS — R5383 Other fatigue: Secondary | ICD-10-CM | POA: Insufficient documentation

## 2019-11-26 DIAGNOSIS — D649 Anemia, unspecified: Secondary | ICD-10-CM | POA: Diagnosis not present

## 2019-11-26 DIAGNOSIS — Z7982 Long term (current) use of aspirin: Secondary | ICD-10-CM | POA: Diagnosis not present

## 2019-11-26 DIAGNOSIS — Z87891 Personal history of nicotine dependence: Secondary | ICD-10-CM | POA: Insufficient documentation

## 2019-11-26 DIAGNOSIS — I5032 Chronic diastolic (congestive) heart failure: Secondary | ICD-10-CM | POA: Diagnosis not present

## 2019-11-26 DIAGNOSIS — K5730 Diverticulosis of large intestine without perforation or abscess without bleeding: Secondary | ICD-10-CM | POA: Diagnosis not present

## 2019-11-26 DIAGNOSIS — J189 Pneumonia, unspecified organism: Secondary | ICD-10-CM | POA: Insufficient documentation

## 2019-11-26 DIAGNOSIS — Z794 Long term (current) use of insulin: Secondary | ICD-10-CM | POA: Insufficient documentation

## 2019-11-26 HISTORY — DX: Heart failure, unspecified: I50.9

## 2019-11-26 LAB — CBC WITH DIFFERENTIAL/PLATELET
Abs Immature Granulocytes: 0.03 10*3/uL (ref 0.00–0.07)
Basophils Absolute: 0.1 10*3/uL (ref 0.0–0.1)
Basophils Relative: 1 %
Eosinophils Absolute: 0.1 10*3/uL (ref 0.0–0.5)
Eosinophils Relative: 1 %
HCT: 29.4 % — ABNORMAL LOW (ref 39.0–52.0)
Hemoglobin: 9.5 g/dL — ABNORMAL LOW (ref 13.0–17.0)
Immature Granulocytes: 0 %
Lymphocytes Relative: 7 %
Lymphs Abs: 0.6 10*3/uL — ABNORMAL LOW (ref 0.7–4.0)
MCH: 30.9 pg (ref 26.0–34.0)
MCHC: 32.3 g/dL (ref 30.0–36.0)
MCV: 95.8 fL (ref 80.0–100.0)
Monocytes Absolute: 1.1 10*3/uL — ABNORMAL HIGH (ref 0.1–1.0)
Monocytes Relative: 12 %
Neutro Abs: 7 10*3/uL (ref 1.7–7.7)
Neutrophils Relative %: 79 %
Platelets: 256 10*3/uL (ref 150–400)
RBC: 3.07 MIL/uL — ABNORMAL LOW (ref 4.22–5.81)
RDW: 12.3 % (ref 11.5–15.5)
WBC: 8.9 10*3/uL (ref 4.0–10.5)
nRBC: 0 % (ref 0.0–0.2)

## 2019-11-26 LAB — HEPATIC FUNCTION PANEL
ALT: 36 U/L (ref 0–44)
AST: 30 U/L (ref 15–41)
Albumin: 3.8 g/dL (ref 3.5–5.0)
Alkaline Phosphatase: 87 U/L (ref 38–126)
Bilirubin, Direct: 0.1 mg/dL (ref 0.0–0.2)
Indirect Bilirubin: 0.5 mg/dL (ref 0.3–0.9)
Total Bilirubin: 0.6 mg/dL (ref 0.3–1.2)
Total Protein: 6.9 g/dL (ref 6.5–8.1)

## 2019-11-26 LAB — BASIC METABOLIC PANEL
Anion gap: 9 (ref 5–15)
BUN: 36 mg/dL — ABNORMAL HIGH (ref 8–23)
CO2: 23 mmol/L (ref 22–32)
Calcium: 8.5 mg/dL — ABNORMAL LOW (ref 8.9–10.3)
Chloride: 98 mmol/L (ref 98–111)
Creatinine, Ser: 1.39 mg/dL — ABNORMAL HIGH (ref 0.61–1.24)
GFR calc Af Amer: 54 mL/min — ABNORMAL LOW (ref >60)
GFR calc non Af Amer: 46 mL/min — ABNORMAL LOW (ref >60)
Glucose, Bld: 204 mg/dL — ABNORMAL HIGH (ref 70–99)
Potassium: 4.8 mmol/L (ref 3.5–5.1)
Sodium: 130 mmol/L — ABNORMAL LOW (ref 135–145)

## 2019-11-26 LAB — TROPONIN I (HIGH SENSITIVITY)
Troponin I (High Sensitivity): 5 ng/L (ref <18)
Troponin I (High Sensitivity): 5 ng/L (ref <18)

## 2019-11-26 LAB — LIPASE, BLOOD: Lipase: 23 U/L (ref 11–51)

## 2019-11-26 LAB — BRAIN NATRIURETIC PEPTIDE: B Natriuretic Peptide: 112 pg/mL — ABNORMAL HIGH (ref 0.0–100.0)

## 2019-11-26 MED ORDER — AMOXICILLIN-POT CLAVULANATE 875-125 MG PO TABS
1.0000 | ORAL_TABLET | Freq: Once | ORAL | Status: AC
  Administered 2019-11-26: 1 via ORAL
  Filled 2019-11-26: qty 1

## 2019-11-26 MED ORDER — IOHEXOL 350 MG/ML SOLN
100.0000 mL | Freq: Once | INTRAVENOUS | Status: AC | PRN
  Administered 2019-11-26: 100 mL via INTRAVENOUS

## 2019-11-26 MED ORDER — AMOXICILLIN-POT CLAVULANATE 875-125 MG PO TABS
1.0000 | ORAL_TABLET | Freq: Two times a day (BID) | ORAL | 0 refills | Status: DC
Start: 2019-11-26 — End: 2019-11-29

## 2019-11-26 NOTE — ED Provider Notes (Addendum)
[facility] EMERGENCY DEPARTMENT Provider Note   CSN: [id number] Arrival date & time: 11/26/19  1209     History Chief Complaint  Patient presents with  . Shortness of Breath    Jerry Burns is a 86 y.o. male.  Patient brought in for feeling fatigued for about the past week.  Patient family so there is little bit of shortness of breath today.  Patient has a history of congestive heart failure.  There is been some weight gain.  They noted that his abdomen is distended.  No real increased swelling in the legs.  No chest pain.  No fevers no cough patient had Covid vaccine at the second 1 back in January.  Patient is on Demadex.  Takes a half a tablet a day.  Past medical history significant hyperlipidemia stroke hypertension coronary artery disease diabetes DVT lower extremity and 2020.  And the congestive heart failure.        Past Medical History:  Diagnosis Date  . Anginal pain ([place])   . Arthritis    Bilateral knee  . CHF (congestive heart failure) ([place])   . Coronary artery disease   . Diabetes mellitus    insulin dependent   . DVT, lower extremity, distal, acute ([place]) 09/09/2018  . Hyperlipidemia   . Hypertension   . Progressive angina ([place]) 03/2015   a. s/p DES to proximal LAD and balloon angioplasty to distal LAD in 2016 b. patent stent by repeat cath in 01/2017 with mild residual CAD)  . Stroke [facility])     Patient Active Problem List   Diagnosis Date Noted  . Chronic cough 10/26/2018  . Pleural effusion, bilateral  L > R first detected 09/02/2018 in setting of dx of dvt 09/14/2018  . DOE (dyspnea on exertion) 09/13/2018  . DVT, lower extremity, distal, acute ([place]) 09/09/2018  . Acute diastolic CHF (congestive heart failure) ([place]) 09/03/2018  . Elevated d-dimer   . Leg swelling   . Peripheral edema   . Normochromic normocytic anemia 04/11/2018  . Heme positive stool 04/11/2018  . Long term current use of antithrombotics/antiplatelets 02/06/2016  . Exertional  angina ([place]) 04/12/2015  . History of colonic polyps 11/21/2014  . ASCVD (arteriosclerotic cardiovascular disease) 09/03/2014  . Unstable angina ([place]) 08/22/2014  . Type 2 diabetes mellitus with stage 3a chronic kidney disease, with long-term current use of insulin ([place]) 08/08/2014  . Chest pain on exertion 07/23/2014  . Rotator cuff syndrome of right shoulder 12/16/2011  . Shoulder pain, right 12/16/2011  . HYPERKALEMIA 01/31/2009  . KNEE PAIN, LEFT 07/31/2008  . Hyperlipidemia associated with type 2 diabetes mellitus ([place]) 12/23/2007  . BPH (benign prostatic hyperplasia) 01/26/2007  . URINARY TRACT INFECTION 01/12/2007  . URINARY RETENTION 01/12/2007  . ERECTILE DYSFUNCTION 11/01/2006  . Hyperlipidemia 08/04/2006  . Essential hypertension 08/04/2006  . OSTEOARTHRITIS 08/04/2006    Past Surgical History:  Procedure Laterality Date  . APPENDECTOMY    . CARDIAC CATHETERIZATION N/A 04/12/2015   Procedure: Left Heart Cath and Coronary Angiography;  Surgeon: Sherren Mocha, MD; pLAD 75>0% w/ 3.0x12 mm Resolute DES, dLAD 80>0% w/ Angiosculpt scoring balloon, CFX luminal irreg, RCA mild dz, EF 55-65%   . COLONOSCOPY  2011   RMR: 1. Normal rectum 2. Left sided diverticula , 2 ascending colon polyps, status post snare polypectomy. Remainder of colonic mucosa appeared unremarkable.   . COLONOSCOPY N/A 05/13/2016   Procedure: COLONOSCOPY;  Surgeon: Daneil Dolin, MD;  Location[facility] ENDO [facility];  Service: Endoscopy;  Laterality: N/A;  7:30 AM  . COLONOSCOPY N/A 05/18/2018   Procedure: COLONOSCOPY;  Surgeon: Daneil Dolin, MD;  Location: [facility] ENDO SUITE;  Service: Endoscopy;  Laterality: N/A;  1:15pm  . CORONARY ANGIOGRAPHY N/A 02/04/2017   Procedure: CORONARY ANGIOGRAPHY (CATH LAB);  Surgeon: Troy Sine, MD;  Location: [place] CV LAB;  Service: Cardiovascular;  Laterality: N/A;  . CORONARY ANGIOPLASTY WITH STENT PLACEMENT  08/22/2014   PTCA/DES X mLAD with 2.5 x 16 mm Promus Premier DES  by Dr Julianne Handler  . ESOPHAGOGASTRODUODENOSCOPY N/A 05/18/2018   Procedure: ESOPHAGOGASTRODUODENOSCOPY (EGD);  Surgeon: Daneil Dolin, MD;  Location: [facility];  Service: Endoscopy;  Laterality: N/A;  . FOOT SURGERY Right   . LEFT HEART CATHETERIZATION WITH CORONARY ANGIOGRAM N/A 08/22/2014   Procedure: LEFT HEART CATHETERIZATION WITH CORONARY ANGIOGRAM;  Surgeon: Burnell Blanks, MD;  Location: [facility] CATH LAB;  Service: Cardiovascular;  Laterality: N/A;  . PERCUTANEOUS CORONARY STENT INTERVENTION (PCI-S)  08/22/2014   Procedure: PERCUTANEOUS CORONARY STENT INTERVENTION (PCI-S);  Surgeon: Burnell Blanks, MD;   . SHOULDER SURGERY    . TONSILLECTOMY         Family History  Problem Relation Age of Onset  . Diabetes Other   . Diabetes Brother   . Colon cancer Maternal Uncle   . Skin cancer Mother   . Diabetes Father   . Hypertension Sister   . Skin cancer Sister     Social History   Tobacco Use  . Smoking status: Former Smoker    Packs/day: 1.00    Years: 20.00    Pack years: 20.00    Types: Cigarettes, Cigars    Start date: 08/10/1955    Quit date: 07/28/2007    Years since quitting: 12.3  . Smokeless tobacco: Never Used  Substance Use Topics  . Alcohol use: Not Currently    Alcohol/week: 0.0 standard drinks    Comment: Daily about 1 glass of wine  . Drug use: No    Home Medications Prior to Admission medications   Medication Sig Start Date End Date Taking? Authorizing Provider  amLODipine (NORVASC) 5 MG tablet TAKE (1) TABLET BY MOUTH ONCE DAILY. Patient taking differently: Take 5 mg by mouth daily. TAKE (1) TABLET BY MOUTH ONCE DAILY. 10/05/19  Yes Kathyrn Drown, MD  aspirin EC 81 MG tablet Take 81 mg by mouth daily.   Yes [provider]  atorvastatin (LIPITOR) 80 MG tablet TAKE (1) TABLET BY MOUTH ONCE DAILY. Patient taking differently: Take 80 mg by mouth daily. TAKE (1) TABLET BY MOUTH ONCE DAILY. 05/01/19  Yes Luking, Elayne Snare, MD  carvedilol  (COREG) 12.5 MG tablet TAKE (1) TABLET BY MOUTH TWICE DAILY. Patient taking differently: Take 12.5 mg by mouth in the morning and at bedtime. TAKE (1) TABLET BY MOUTH TWICE DAILY. 02/13/19  Yes Herminio Commons, MD  dutasteride (AVODART) 0.5 MG capsule TAKE ONE CAPSULE BY MOUTH ONCE DAILY. Patient taking differently: Take 0.5 mg by mouth daily.  08/14/19  Yes Kathyrn Drown, MD  famotidine (PEPCID) 20 MG tablet Take one tablet po daily Patient taking differently: Take 20 mg by mouth daily. Take one tablet po daily 11/02/19  Yes Luking, Dossie Ocanas A, MD  glucose blood (CONTOUR NEXT TEST) test strip USE 1 STRIP TO CHECK GLUCOSE THREE TIMES DAILY. FOR ICD 10 E11.9 Patient taking differently: 1 each by Other route in the morning, at noon, and at bedtime. USE 1 STRIP TO CHECK GLUCOSE THREE TIMES  DAILY. FOR ICD 10 E11.9 04/04/19  Yes Luking, Elayne Snare, MD  insulin lispro (HUMALOG KWIKPEN) 100 UNIT/ML KwikPen Inject twice daily per following: Breakfast 2 units; if 150-250 then 3 units; if greater than 250 5 units. Supper-3 units but if 150-250 do 4 units; if greater that 250 6 units Patient taking differently: Inject 2-6 Units into the skin 3 (three) times daily as needed. Inject twice daily per following: Breakfast 2 units; if 150-250 then 3 units; if greater than 250 5 units. Supper-3 units but if 150-250 do 4 units; if greater that 250 6 units 11/24/19  Yes Luking, Elayne Snare, MD  LANTUS SOLOSTAR 100 UNIT/ML Solostar Pen INJECT 22 UNITS SUBCUTANEOUSLY ONCE DAILY AT 10PM Patient taking differently: Inject 22 Units into the skin every evening. INJECT 22 UNITS SUBCUTANEOUSLY ONCE DAILY AT 10PM 11/14/19  Yes Luking, Elayne Snare, MD  metFORMIN (GLUCOPHAGE) 500 MG tablet Take one tablet po two times daily Patient taking differently: Take 500 mg by mouth in the morning and at bedtime. Take one tablet po two times daily 11/02/19  Yes Luking, Reneisha Stilley A, MD  nitroGLYCERIN (NITROSTAT) 0.4 MG SL tablet PLACE 1 TAB UNDER TONGUE EVERY 5 MIN  IF NEEDED FOR CHEST PAIN. MAY USE 3 TIMES.NO RELIEF CALL 911. Patient taking differently: Place 0.4 mg under the tongue every 5 (five) minutes as needed. PLACE 1 TAB UNDER TONGUE EVERY 5 MIN IF NEEDED FOR CHEST PAIN. MAY USE 3 TIMES.NO RELIEF CALL 911. 01/31/19  Yes Strader, [place] M, PA-C  sitaGLIPtin (JANUVIA) 100 MG tablet TAKE (1) TABLET BY MOUTH ONCE DAILY. Patient taking differently: Take 100 mg by mouth daily. TAKE (1) TABLET BY MOUTH ONCE DAILY. 05/01/19  Yes Kathyrn Drown, MD  tamsulosin (FLOMAX) 0.4 MG CAPS capsule TAKE (1) CAPSULE BY MOUTH ONCE DAILY. Patient taking differently: Take 0.4 mg by mouth daily.  08/14/19  Yes Kathyrn Drown, MD  torsemide (DEMADEX) 20 MG tablet Take one tablet qam prn edema Patient taking differently: Take 10 mg by mouth daily. 1/2 tablet every morning 05/01/19  Yes Luking, Elayne Snare, MD  Ascorbic Acid (VITAMIN C) 1000 MG tablet Take 500 mg by mouth in the morning and at bedtime.     [provider]  COMBIGAN 0.2-0.5 % ophthalmic solution Place 1 drop into the left eye 2 (two) times daily.  09/06/15   [provider]  Cyanocobalamin (B-12) 2500 MCG TABS Take 1 tablet by mouth 3 (three) times a week. Three times a week on Monday Wednesday and Friday     [provider]  Emollient (CERAVE) CREA Apply 1 application topically daily as needed (dry skin).    [provider]  erythromycin ophthalmic ointment APPLY A SMALL RIBBON INTOOTHE LEFT EYE 3 TIMES DAILY AS NEEDED. 07/17/19   [provider]  Flaxseed, Linseed, (FLAXSEED OIL PO) Take 1 capsule by mouth daily.     [provider]  mometasone (ELOCON) 0.1 % cream APPLY TO AFFECTED AREAS ONCE DAILY. 09/26/19   Kathyrn Drown, MD  Multiple Vitamin (MULTIVITAMIN) tablet Take 1 tablet by mouth daily.    [provider]  Polyvinyl Alcohol-Povidone (REFRESH OP) Place 1 drop into the left eye 2 (two) times daily.    [provider]  psyllium (METAMUCIL)  58.6 % powder Take 1 packet by mouth daily.     [provider]  Turmeric 500 MG CAPS Take 500 mg by mouth daily.    [provider]    Allergies  Adhesive [tape], Sulfa antibiotics, and Latex  Review of Systems   Review of Systems  Constitutional: Positive for fatigue. Negative for chills and fever.  HENT: Negative for congestion, ear pain, rhinorrhea and sore throat.   Eyes: Negative for pain and visual disturbance.  Respiratory: Positive for shortness of breath. Negative for cough.   Cardiovascular: Positive for leg swelling. Negative for chest pain and palpitations.  Gastrointestinal: Positive for abdominal distention. Negative for abdominal pain, diarrhea, nausea and vomiting.  Genitourinary: Negative for dysuria and hematuria.  Musculoskeletal: Negative for arthralgias, back pain and neck pain.  Skin: Negative for color change and rash.  Neurological: Negative for dizziness, seizures, syncope, light-headedness and headaches.  Hematological: Does not bruise/bleed easily.  Psychiatric/Behavioral: Negative for confusion.  All other systems reviewed and are negative.   Physical Exam Updated Vital Signs BP (!) 141/78   Pulse 94   Temp 97.7 F (36.5 C) (Oral)   Resp 18   Ht 1.803 m (5\' 11" )   Wt 101.6 kg   SpO2 98%   BMI 31.24 kg/m   Physical Exam Vitals and nursing note reviewed.  Constitutional:      Appearance: Normal appearance. He is well-developed.  HENT:     Head: Normocephalic and atraumatic.     Comments: Some facial discoloration suggestive may be a little bit of bruising on both cheeks.  But does not look necessarily traumatic    Mouth/Throat:     Mouth: Mucous membranes are moist.  Eyes:     Extraocular Movements: Extraocular movements intact.     Conjunctiva/sclera: Conjunctivae normal.     Pupils: Pupils are equal, round, and reactive to light.  Cardiovascular:     Rate and Rhythm: Normal rate and regular rhythm.     Heart sounds:  No murmur.  Pulmonary:     Effort: Pulmonary effort is normal. No respiratory distress.     Breath sounds: Normal breath sounds. No wheezing or rales.  Abdominal:     General: Bowel sounds are normal. There is distension.     Palpations: Abdomen is soft.     Tenderness: There is no abdominal tenderness.  Musculoskeletal:        General: Swelling present.     Cervical back: Normal range of motion and neck supple.     Right lower leg: Edema present.     Left lower leg: Edema present.  Skin:    General: Skin is warm and dry.     Capillary Refill: Capillary refill takes less than 2 seconds.  Neurological:     General: No focal deficit present.     Mental Status: He is alert and oriented to person, place, and time.     Cranial Nerves: No cranial nerve deficit.     Sensory: No sensory deficit.     ED Results / Procedures / Treatments   Labs (all labs ordered are listed, but only abnormal results are displayed) Labs Reviewed  CBC WITH DIFFERENTIAL/PLATELET - Abnormal; Notable for the following components:      Result Value   RBC 3.07 (*)    Hemoglobin 9.5 (*)    HCT 29.4 (*)    Lymphs Abs 0.6 (*)    Monocytes Absolute 1.1 (*)    All other components within normal limits  BASIC METABOLIC PANEL - Abnormal; Notable for the following components:   Sodium 130 (*)    Glucose, Bld 204 (*)    BUN 36 (*)    Creatinine, Ser 1.39 (*)  Calcium 8.5 (*)    GFR calc non Af Amer 46 (*)    GFR calc Af Amer 54 (*)    All other components within normal limits  BRAIN NATRIURETIC PEPTIDE - Abnormal; Notable for the following components:   B Natriuretic Peptide 112.0 (*)    All other components within normal limits  LIPASE, BLOOD  HEPATIC FUNCTION PANEL  TROPONIN I (HIGH SENSITIVITY)    EKG EKG Interpretation  Date/Time:  Sunday Nov 26 2019 12:26:40 EDT Ventricular Rate:  95 PR Interval:    QRS Duration: 124 QT Interval:  369 QTC Calculation: 464 R Axis:   -52 Text Interpretation:  Atrial fibrillation Left bundle branch block Baseline wander in lead(s) V3 Confirmed by Fredia Sorrow [phone]) on 11/26/2019 1:22:22 PM   Radiology DG Chest Portable 1 View  Result Date: 11/26/2019 CLINICAL DATA:  Shortness of breath since yesterday. EXAM: PORTABLE CHEST 1 VIEW COMPARISON:  02/12/2019 and chest CT 02/11/2019 FINDINGS: Lordotic technique is demonstrated. Lungs are adequately inflated without focal lobar consolidation or effusion. Evidence of calcified pleural plaques bilaterally. Mild stable cardiomegaly. Remainder of the exam is unchanged. IMPRESSION: 1.  No acute cardiopulmonary disease. 2.  Mild stable cardiomegaly. 3.  Evidence of asbestos related pleural disease. Electronically Signed   By: Marin Olp M.D.   On: 11/26/2019 13:41    Procedures Procedures (including critical care time)  Medications Ordered in ED Medications - No data to display  ED Course  I have reviewed the triage vital signs and the nursing notes.  Pertinent labs & imaging results that were available during my care of the patient were reviewed by me and considered in my medical decision making (see chart for details).    MDM Rules/Calculators/A&P                     Patient definitely does have abdominal distention.  Will add on hepatic lipase get CT of abdomen.  Work-up for congestive heart failure BNP up a little bit chest x-ray negative troponin normal basic metabolic panel has some renal insufficiency but no significant changes.  Does seem to have a little bit of an anemia with hemoglobin of 9.5.  Patient denies any blood in his bowel movements.  Patient normally gets iron shots twice a year because his blood counts do drop.  Some may be related to that.  Since he also had shortness of breath today and has a past history of DVTs although not on blood thinners we will go ahead since were CT and him and his GFR's are marginal we will also go ahead and get the CT angio chest just to take closer look at  the lung fields and the rule out pulmonary embolus.  However is not tachycardic here but his heart rates are in the upper 90s oxygen sats are fine on room air in the upper 90s.  Regarding the anemia.  Is followed by hematology oncology.  They do feel that there may be a slow GI source.  There is been a few suspects where there is been a little bit of erosive gastric, also has direct diverticulosis.  He has had several Hemoccults that have been negative in the past.  Not appear to be any evidence of acute large amount of GI blood loss.  But in the past had mentioned that he had fatigue and that is even when his hemoglobins have been in the 10 range so not sure if there is a connection today's complaint.  Final Clinical Impression(s) / ED Diagnoses Final diagnoses:  Anemia, unspecified type  Fatigue, unspecified type  Abdominal distention  Shortness of breath    Rx / DC Orders ED Discharge Orders    None       Fredia Sorrow, MD 11/26/19 1444    Fredia Sorrow, MD 11/26/19 1447

## 2019-11-26 NOTE — ED Triage Notes (Signed)
Pt with sob since yesterday with weight gain.  Pt with hx of CHF.

## 2019-11-26 NOTE — ED Provider Notes (Signed)
Care of the patient assumed at the change of shift pending CT CAP. Patient here for SOB, DOE along with weight gain and abdominal swelling. His labs show a moderate anemia, worse than recent baseline, but has not had an iron infusion in about 2 months. He is no longer taking blood thinners. CT CAP images and report reviewed' concern for infiltrates on CTA chest, but otherwise no acute findings. On his CT A/P he has signs of duodenitis and constipation with some mild ascites, but no other acute processes. He denies any cough or fever. His WBC is normal but he is tachypneic.   4:57 PM Reviewed results with the patient including all the incidental findings of CT. Discussed admission vs treatment at home and he would like to go home. Will give Rx for Abx to cover both suspected pneumonia and duodenitis. Patient reports he is scheduled to see his doctor in 3 days. Advised to return to the ED in the meantime for any worsening symptoms, fevers or any other concerns.    Truddie Hidden, MD 11/26/19 [phone]

## 2019-11-27 ENCOUNTER — Telehealth: Payer: Self-pay | Admitting: Family Medicine

## 2019-11-27 NOTE — Telephone Encounter (Signed)
Please advise. Thank you

## 2019-11-27 NOTE — Telephone Encounter (Signed)
Pt has been diagnosed with pneumonia yesterday. He would like to know if his visit needs to be changed to virtual for Wednesday.   Pt wants Dr. Nicki Reaper to know with the new schedule his blood sugar readings have been great.

## 2019-11-27 NOTE — Telephone Encounter (Signed)
I can work with the patient either way If he does not feel like coming up we can do a virtual But if he comes it would allow me to take a good listen to his lungs So therefore I would prefer in person if he can do it

## 2019-11-27 NOTE — Telephone Encounter (Signed)
Tent or in office visit? It doesn't look like he was tested for Covid

## 2019-11-27 NOTE — Telephone Encounter (Signed)
Please explained to patient car visit because of recent pneumonia diagnosis and trying to make sure other patients are not exposed

## 2019-11-28 NOTE — Telephone Encounter (Signed)
Patient notified visit would be car visit and to call the office once he arrives.

## 2019-11-29 ENCOUNTER — Other Ambulatory Visit: Payer: Self-pay | Admitting: *Deleted

## 2019-11-29 ENCOUNTER — Telehealth: Payer: Self-pay | Admitting: *Deleted

## 2019-11-29 ENCOUNTER — Other Ambulatory Visit: Payer: Self-pay

## 2019-11-29 ENCOUNTER — Encounter: Payer: Self-pay | Admitting: Family Medicine

## 2019-11-29 ENCOUNTER — Ambulatory Visit (INDEPENDENT_AMBULATORY_CARE_PROVIDER_SITE_OTHER): Payer: Medicare Other | Admitting: Family Medicine

## 2019-11-29 DIAGNOSIS — R0609 Other forms of dyspnea: Secondary | ICD-10-CM

## 2019-11-29 DIAGNOSIS — I3139 Other pericardial effusion (noninflammatory): Secondary | ICD-10-CM

## 2019-11-29 DIAGNOSIS — D500 Iron deficiency anemia secondary to blood loss (chronic): Secondary | ICD-10-CM | POA: Diagnosis not present

## 2019-11-29 DIAGNOSIS — I313 Pericardial effusion (noninflammatory): Secondary | ICD-10-CM

## 2019-11-29 DIAGNOSIS — E538 Deficiency of other specified B group vitamins: Secondary | ICD-10-CM | POA: Diagnosis not present

## 2019-11-29 DIAGNOSIS — N289 Disorder of kidney and ureter, unspecified: Secondary | ICD-10-CM | POA: Diagnosis not present

## 2019-11-29 DIAGNOSIS — R0600 Dyspnea, unspecified: Secondary | ICD-10-CM

## 2019-11-29 DIAGNOSIS — R188 Other ascites: Secondary | ICD-10-CM | POA: Diagnosis not present

## 2019-11-29 DIAGNOSIS — D649 Anemia, unspecified: Secondary | ICD-10-CM

## 2019-11-29 MED ORDER — CEFDINIR 300 MG PO CAPS
300.0000 mg | ORAL_CAPSULE | Freq: Two times a day (BID) | ORAL | 0 refills | Status: DC
Start: 2019-11-29 — End: 2019-12-02

## 2019-11-29 NOTE — Progress Notes (Signed)
   Subjective:    Patient ID: Jerry Burns, male    DOB: Jul 24, 1934, 86 y.o.   MRN: [id number]  HPI Follow up on pneumonia. Sob when moving around.  This patient went to the ER because he was having bloated feeling along with shortness of breath and fatigue and tiredness he had been recently seen for his diabetes and had his insulin adjusted he denied fever chills or sweats he states a lot of belching since being in the ER they diagnosed him with pneumonia ascites and pericardial effusion He also relates a lot of ongoing fatigue and tiredness bloating feeling slight nausea belching plus also some loose stools he is on Augmentin Bloating. Started last week.   Fatigue and fog brain.   Pt states blood sugar has been running good and he brought his readings.  His glucose readings look much improved compared to where they were  Review of Systems He relates fatigue tiredness he relates swollen abdomen.  Denies swelling in the legs.    Objective:   Physical Exam Lungs are clear respiratory rate normal O2 saturation 95% extremities 1+ edema abdomen mild abdominal swelling noted.       Assessment & Plan:  1. Vitamin B 12 deficiency We will check lab work await the result - Vitamin B12  2. Anemia, normocytic normochromic CBC was going down in the ER will recheck this again because of patient's fatigue getting out of breath with activity O2 sat 93% today - CBC with Differential/Platelet - Ferritin  3. Anemia due to gastrointestinal blood loss Please see above Urgent referral to GI, patient has had iron infusions in the past may need to have another 1  - Ambulatory referral to Gastroenterology  4. Renal insufficiency Check metabolic 7 because of pedal edema increase diuretic to a full tablet each morning  - Basic metabolic panel  5. Other ascites Because of the ascites referral to GI patient also having bloating and some irregularity with his bowels it is possible the Augmentin  is causing problem switching over to [facility] recheck the patient in 5 days - Hepatic function panel  Glucose readings under good control  Pericardial effusion minimal doubt this is causing his problem we will look at an echo

## 2019-11-29 NOTE — Telephone Encounter (Signed)
ECHO order put in for pt. precert?

## 2019-11-30 ENCOUNTER — Other Ambulatory Visit: Payer: Self-pay

## 2019-11-30 ENCOUNTER — Telehealth: Payer: Self-pay | Admitting: Family Medicine

## 2019-11-30 ENCOUNTER — Inpatient Hospital Stay (HOSPITAL_COMMUNITY): Payer: Medicare Other

## 2019-11-30 ENCOUNTER — Encounter (HOSPITAL_COMMUNITY): Payer: Self-pay | Admitting: Emergency Medicine

## 2019-11-30 ENCOUNTER — Inpatient Hospital Stay (HOSPITAL_COMMUNITY)
Admission: EM | Admit: 2019-11-30 | Discharge: 2019-12-02 | DRG: 682 | Disposition: A | Discharge status: Home / Self Care | Payer: Medicare Other | Attending: Internal Medicine | Admitting: Internal Medicine

## 2019-11-30 ENCOUNTER — Encounter: Payer: Self-pay | Admitting: Internal Medicine

## 2019-11-30 ENCOUNTER — Emergency Department (HOSPITAL_COMMUNITY): Payer: Medicare Other

## 2019-11-30 DIAGNOSIS — E872 Acidosis: Secondary | ICD-10-CM | POA: Diagnosis present

## 2019-11-30 DIAGNOSIS — Z9049 Acquired absence of other specified parts of digestive tract: Secondary | ICD-10-CM | POA: Diagnosis not present

## 2019-11-30 DIAGNOSIS — Z833 Family history of diabetes mellitus: Secondary | ICD-10-CM

## 2019-11-30 DIAGNOSIS — N179 Acute kidney failure, unspecified: Principal | ICD-10-CM | POA: Diagnosis present

## 2019-11-30 DIAGNOSIS — N141 Nephropathy induced by other drugs, medicaments and biological substances: Secondary | ICD-10-CM | POA: Diagnosis present

## 2019-11-30 DIAGNOSIS — Z7982 Long term (current) use of aspirin: Secondary | ICD-10-CM

## 2019-11-30 DIAGNOSIS — R188 Other ascites: Secondary | ICD-10-CM | POA: Diagnosis not present

## 2019-11-30 DIAGNOSIS — I454 Nonspecific intraventricular block: Secondary | ICD-10-CM | POA: Diagnosis present

## 2019-11-30 DIAGNOSIS — Z9089 Acquired absence of other organs: Secondary | ICD-10-CM | POA: Diagnosis not present

## 2019-11-30 DIAGNOSIS — E669 Obesity, unspecified: Secondary | ICD-10-CM | POA: Diagnosis present

## 2019-11-30 DIAGNOSIS — Z86718 Personal history of other venous thrombosis and embolism: Secondary | ICD-10-CM | POA: Diagnosis not present

## 2019-11-30 DIAGNOSIS — J189 Pneumonia, unspecified organism: Secondary | ICD-10-CM | POA: Diagnosis present

## 2019-11-30 DIAGNOSIS — I5030 Unspecified diastolic (congestive) heart failure: Secondary | ICD-10-CM | POA: Diagnosis not present

## 2019-11-30 DIAGNOSIS — Z20822 Contact with and (suspected) exposure to covid-19: Secondary | ICD-10-CM | POA: Diagnosis present

## 2019-11-30 DIAGNOSIS — E1129 Type 2 diabetes mellitus with other diabetic kidney complication: Secondary | ICD-10-CM | POA: Diagnosis not present

## 2019-11-30 DIAGNOSIS — Z6831 Body mass index (BMI) 31.0-31.9, adult: Secondary | ICD-10-CM

## 2019-11-30 DIAGNOSIS — N1831 Chronic kidney disease, stage 3a: Secondary | ICD-10-CM | POA: Diagnosis present

## 2019-11-30 DIAGNOSIS — I2510 Atherosclerotic heart disease of native coronary artery without angina pectoris: Secondary | ICD-10-CM | POA: Diagnosis present

## 2019-11-30 DIAGNOSIS — M170 Bilateral primary osteoarthritis of knee: Secondary | ICD-10-CM | POA: Diagnosis present

## 2019-11-30 DIAGNOSIS — E871 Hypo-osmolality and hyponatremia: Secondary | ICD-10-CM | POA: Diagnosis present

## 2019-11-30 DIAGNOSIS — E1169 Type 2 diabetes mellitus with other specified complication: Secondary | ICD-10-CM | POA: Diagnosis present

## 2019-11-30 DIAGNOSIS — Z800 Family history of malignant neoplasm of digestive organs: Secondary | ICD-10-CM

## 2019-11-30 DIAGNOSIS — T508X5A Adverse effect of diagnostic agents, initial encounter: Secondary | ICD-10-CM | POA: Diagnosis present

## 2019-11-30 DIAGNOSIS — Z79899 Other long term (current) drug therapy: Secondary | ICD-10-CM

## 2019-11-30 DIAGNOSIS — I10 Essential (primary) hypertension: Secondary | ICD-10-CM | POA: Diagnosis not present

## 2019-11-30 DIAGNOSIS — Z955 Presence of coronary angioplasty implant and graft: Secondary | ICD-10-CM

## 2019-11-30 DIAGNOSIS — N183 Chronic kidney disease, stage 3 unspecified: Secondary | ICD-10-CM | POA: Diagnosis not present

## 2019-11-30 DIAGNOSIS — Z808 Family history of malignant neoplasm of other organs or systems: Secondary | ICD-10-CM

## 2019-11-30 DIAGNOSIS — R0602 Shortness of breath: Secondary | ICD-10-CM | POA: Diagnosis not present

## 2019-11-30 DIAGNOSIS — I130 Hypertensive heart and chronic kidney disease with heart failure and stage 1 through stage 4 chronic kidney disease, or unspecified chronic kidney disease: Secondary | ICD-10-CM | POA: Diagnosis present

## 2019-11-30 DIAGNOSIS — E785 Hyperlipidemia, unspecified: Secondary | ICD-10-CM | POA: Diagnosis present

## 2019-11-30 DIAGNOSIS — Z8673 Personal history of transient ischemic attack (TIA), and cerebral infarction without residual deficits: Secondary | ICD-10-CM

## 2019-11-30 DIAGNOSIS — Z91048 Other nonmedicinal substance allergy status: Secondary | ICD-10-CM

## 2019-11-30 DIAGNOSIS — E8770 Fluid overload, unspecified: Secondary | ICD-10-CM | POA: Diagnosis not present

## 2019-11-30 DIAGNOSIS — R7989 Other specified abnormal findings of blood chemistry: Secondary | ICD-10-CM | POA: Diagnosis present

## 2019-11-30 DIAGNOSIS — Z91040 Latex allergy status: Secondary | ICD-10-CM

## 2019-11-30 DIAGNOSIS — K219 Gastro-esophageal reflux disease without esophagitis: Secondary | ICD-10-CM | POA: Diagnosis present

## 2019-11-30 DIAGNOSIS — Z794 Long term (current) use of insulin: Secondary | ICD-10-CM

## 2019-11-30 DIAGNOSIS — I5032 Chronic diastolic (congestive) heart failure: Secondary | ICD-10-CM | POA: Diagnosis present

## 2019-11-30 DIAGNOSIS — E875 Hyperkalemia: Secondary | ICD-10-CM | POA: Diagnosis present

## 2019-11-30 DIAGNOSIS — Z8249 Family history of ischemic heart disease and other diseases of the circulatory system: Secondary | ICD-10-CM

## 2019-11-30 DIAGNOSIS — Z87891 Personal history of nicotine dependence: Secondary | ICD-10-CM

## 2019-11-30 DIAGNOSIS — Z86010 Personal history of colonic polyps: Secondary | ICD-10-CM

## 2019-11-30 DIAGNOSIS — Z882 Allergy status to sulfonamides status: Secondary | ICD-10-CM

## 2019-11-30 DIAGNOSIS — E1122 Type 2 diabetes mellitus with diabetic chronic kidney disease: Secondary | ICD-10-CM | POA: Diagnosis present

## 2019-11-30 DIAGNOSIS — Z8719 Personal history of other diseases of the digestive system: Secondary | ICD-10-CM

## 2019-11-30 DIAGNOSIS — R140 Abdominal distension (gaseous): Secondary | ICD-10-CM | POA: Diagnosis not present

## 2019-11-30 LAB — CBC
HCT: 28.5 % — ABNORMAL LOW (ref 39.0–52.0)
Hemoglobin: 9.5 g/dL — ABNORMAL LOW (ref 13.0–17.0)
MCH: 30.9 pg (ref 26.0–34.0)
MCHC: 33.3 g/dL (ref 30.0–36.0)
MCV: 92.8 fL (ref 80.0–100.0)
Platelets: 287 10*3/uL (ref 150–400)
RBC: 3.07 MIL/uL — ABNORMAL LOW (ref 4.22–5.81)
RDW: 12.3 % (ref 11.5–15.5)
WBC: 8.3 10*3/uL (ref 4.0–10.5)
nRBC: 0 % (ref 0.0–0.2)

## 2019-11-30 LAB — HEPATIC FUNCTION PANEL
ALT: 53 IU/L — ABNORMAL HIGH (ref 0–44)
AST: 42 IU/L — ABNORMAL HIGH (ref 0–40)
Albumin: 3.8 g/dL (ref 3.6–4.6)
Alkaline Phosphatase: 122 IU/L — ABNORMAL HIGH (ref 39–117)
Bilirubin Total: 0.5 mg/dL (ref 0.0–1.2)
Bilirubin, Direct: 0.18 mg/dL (ref 0.00–0.40)
Total Protein: 6.4 g/dL (ref 6.0–8.5)

## 2019-11-30 LAB — CBC WITH DIFFERENTIAL/PLATELET
Basophils Absolute: 0.1 10*3/uL (ref 0.0–0.2)
Basos: 1 %
EOS (ABSOLUTE): 0.1 10*3/uL (ref 0.0–0.4)
Eos: 1 %
Hematocrit: 29 % — ABNORMAL LOW (ref 37.5–51.0)
Hemoglobin: 9.8 g/dL — ABNORMAL LOW (ref 13.0–17.7)
Immature Grans (Abs): 0.1 10*3/uL (ref 0.0–0.1)
Immature Granulocytes: 1 %
Lymphocytes Absolute: 0.7 10*3/uL (ref 0.7–3.1)
Lymphs: 8 %
MCH: 31.3 pg (ref 26.6–33.0)
MCHC: 33.8 g/dL (ref 31.5–35.7)
MCV: 93 fL (ref 79–97)
Monocytes Absolute: 1.2 10*3/uL — ABNORMAL HIGH (ref 0.1–0.9)
Monocytes: 13 %
Neutrophils Absolute: 6.9 10*3/uL (ref 1.4–7.0)
Neutrophils: 76 %
Platelets: 292 10*3/uL (ref 150–450)
RBC: 3.13 x10E6/uL — ABNORMAL LOW (ref 4.14–5.80)
RDW: 12.2 % (ref 11.6–15.4)
WBC: 9 10*3/uL (ref 3.4–10.8)

## 2019-11-30 LAB — COMPREHENSIVE METABOLIC PANEL
ALT: 87 U/L — ABNORMAL HIGH (ref 0–44)
AST: 79 U/L — ABNORMAL HIGH (ref 15–41)
Albumin: 3.4 g/dL — ABNORMAL LOW (ref 3.5–5.0)
Alkaline Phosphatase: 121 U/L (ref 38–126)
Anion gap: 10 (ref 5–15)
BUN: 48 mg/dL — ABNORMAL HIGH (ref 8–23)
CO2: 20 mmol/L — ABNORMAL LOW (ref 22–32)
Calcium: 8.3 mg/dL — ABNORMAL LOW (ref 8.9–10.3)
Chloride: 93 mmol/L — ABNORMAL LOW (ref 98–111)
Creatinine, Ser: 2 mg/dL — ABNORMAL HIGH (ref 0.61–1.24)
GFR calc Af Amer: 34 mL/min — ABNORMAL LOW (ref >60)
GFR calc non Af Amer: 30 mL/min — ABNORMAL LOW (ref >60)
Glucose, Bld: 179 mg/dL — ABNORMAL HIGH (ref 70–99)
Potassium: 4.9 mmol/L (ref 3.5–5.1)
Sodium: 123 mmol/L — ABNORMAL LOW (ref 135–145)
Total Bilirubin: 0.7 mg/dL (ref 0.3–1.2)
Total Protein: 6.7 g/dL (ref 6.5–8.1)

## 2019-11-30 LAB — OSMOLALITY, URINE: Osmolality, Ur: 363 mOsm/kg (ref 300–900)

## 2019-11-30 LAB — BASIC METABOLIC PANEL
BUN/Creatinine Ratio: 22 (ref 10–24)
BUN: 44 mg/dL — ABNORMAL HIGH (ref 8–27)
CO2: 19 mmol/L — ABNORMAL LOW (ref 20–29)
Calcium: 8.8 mg/dL (ref 8.6–10.2)
Chloride: 92 mmol/L — ABNORMAL LOW (ref 96–106)
Creatinine, Ser: 1.96 mg/dL — ABNORMAL HIGH (ref 0.76–1.27)
GFR calc Af Amer: 35 mL/min/{1.73_m2} — ABNORMAL LOW (ref >59)
GFR calc non Af Amer: 30 mL/min/{1.73_m2} — ABNORMAL LOW (ref >59)
Glucose: 156 mg/dL — ABNORMAL HIGH (ref 65–99)
Potassium: 6.3 mmol/L — ABNORMAL HIGH (ref 3.5–5.2)
Sodium: 125 mmol/L — ABNORMAL LOW (ref 134–144)

## 2019-11-30 LAB — GLUCOSE, CAPILLARY
Glucose-Capillary: 177 mg/dL — ABNORMAL HIGH (ref 70–99)
Glucose-Capillary: 176 mg/dL — ABNORMAL HIGH (ref 70–99)

## 2019-11-30 LAB — URINALYSIS, ROUTINE W REFLEX MICROSCOPIC
Bilirubin Urine: NEGATIVE
Glucose, UA: NEGATIVE mg/dL
Hgb urine dipstick: NEGATIVE
Ketones, ur: NEGATIVE mg/dL
Leukocytes,Ua: NEGATIVE
Nitrite: NEGATIVE
Protein, ur: NEGATIVE mg/dL
Specific Gravity, Urine: 1.012 (ref 1.005–1.030)
pH: 5 (ref 5.0–8.0)

## 2019-11-30 LAB — RESPIRATORY PANEL BY RT PCR (FLU A&B, COVID)
Influenza A by PCR: NEGATIVE
Influenza B by PCR: NEGATIVE
SARS Coronavirus 2 by RT PCR: NEGATIVE

## 2019-11-30 LAB — BRAIN NATRIURETIC PEPTIDE: B Natriuretic Peptide: 119 pg/mL — ABNORMAL HIGH (ref 0.0–100.0)

## 2019-11-30 LAB — OSMOLALITY: Osmolality: 282 mOsm/kg (ref 275–295)

## 2019-11-30 LAB — VITAMIN B12: Vitamin B-12: >2000 pg/mL — ABNORMAL HIGH (ref 232–1245)

## 2019-11-30 LAB — FERRITIN: Ferritin: 1111 ng/mL — ABNORMAL HIGH (ref 30–400)

## 2019-11-30 LAB — TSH: TSH: 4.149 u[IU]/mL (ref 0.350–4.500)

## 2019-11-30 LAB — SODIUM, URINE, RANDOM: Sodium, Ur: <102 mmol/L

## 2019-11-30 LAB — CREATININE, URINE, RANDOM: Creatinine, Urine: 107.09 mg/dL

## 2019-11-30 MED ORDER — ONDANSETRON HCL 4 MG PO TABS: 4.0000 mg | ORAL_TABLET | Freq: Four times a day (QID) | ORAL | Status: DC | PRN

## 2019-11-30 MED ORDER — SODIUM CHLORIDE 0.9% FLUSH
3.0000 mL | Freq: Two times a day (BID) | INTRAVENOUS | Status: DC
  Administered 2019-11-30 – 2019-12-01 (×4): 3 mL via INTRAVENOUS

## 2019-11-30 MED ORDER — DUTASTERIDE 0.5 MG PO CAPS
0.5000 mg | ORAL_CAPSULE | Freq: Every day | ORAL | Status: DC
  Administered 2019-11-30 – 2019-12-01 (×2): 0.5 mg via ORAL
  Filled 2019-11-30 (×4): qty 1

## 2019-11-30 MED ORDER — SODIUM CHLORIDE 0.9% FLUSH: 3.0000 mL | INTRAVENOUS | Status: DC | PRN

## 2019-11-30 MED ORDER — ONDANSETRON HCL 4 MG/2ML IJ SOLN: 4.0000 mg | Freq: Four times a day (QID) | INTRAMUSCULAR | Status: DC | PRN

## 2019-11-30 MED ORDER — INSULIN GLARGINE 100 UNIT/ML ~~LOC~~ SOLN
10.0000 [IU] | Freq: Every evening | SUBCUTANEOUS | Status: DC
  Administered 2019-11-30 – 2019-12-01 (×3): 10 [IU] via SUBCUTANEOUS
  Filled 2019-11-30 (×4): qty 0.1

## 2019-11-30 MED ORDER — ADULT MULTIVITAMIN W/MINERALS CH
1.0000 | ORAL_TABLET | Freq: Every day | ORAL | Status: DC
  Administered 2019-12-01 – 2019-12-02 (×2): 1 via ORAL
  Filled 2019-11-30 (×2): qty 1

## 2019-11-30 MED ORDER — VITAMIN B-12 1000 MCG PO TABS
2000.0000 ug | ORAL_TABLET | ORAL | Status: DC
  Administered 2019-12-01: 13:00:00 2000 ug via ORAL
  Filled 2019-11-30: qty 2

## 2019-11-30 MED ORDER — BRIMONIDINE TARTRATE-TIMOLOL 0.2-0.5 % OP SOLN: 1.0000 [drp] | Freq: Two times a day (BID) | OPHTHALMIC | Status: DC

## 2019-11-30 MED ORDER — ACETAMINOPHEN 650 MG RE SUPP: 650.0000 mg | Freq: Four times a day (QID) | RECTAL | Status: DC | PRN

## 2019-11-30 MED ORDER — ATORVASTATIN CALCIUM 40 MG PO TABS
80.0000 mg | ORAL_TABLET | Freq: Every day | ORAL | Status: DC
  Administered 2019-11-30 – 2019-12-02 (×3): 80 mg via ORAL
  Filled 2019-11-30 (×3): qty 2

## 2019-11-30 MED ORDER — TAMSULOSIN HCL 0.4 MG PO CAPS
0.4000 mg | ORAL_CAPSULE | Freq: Every day | ORAL | Status: DC
  Administered 2019-11-30 – 2019-12-02 (×3): 0.4 mg via ORAL
  Filled 2019-11-30 (×3): qty 1

## 2019-11-30 MED ORDER — SODIUM CHLORIDE 1 G PO TABS
1.0000 g | ORAL_TABLET | Freq: Two times a day (BID) | ORAL | Status: DC
  Filled 2019-11-30 (×2): qty 1

## 2019-11-30 MED ORDER — SODIUM CHLORIDE 0.9 % IV SOLN: 250.0000 mL | INTRAVENOUS | Status: DC | PRN

## 2019-11-30 MED ORDER — CARVEDILOL 12.5 MG PO TABS
12.5000 mg | ORAL_TABLET | Freq: Two times a day (BID) | ORAL | Status: DC
  Administered 2019-11-30 – 2019-12-02 (×4): 12.5 mg via ORAL
  Filled 2019-11-30 (×4): qty 1

## 2019-11-30 MED ORDER — SODIUM CHLORIDE 0.9 % IV BOLUS
1000.0000 mL | Freq: Once | INTRAVENOUS | Status: AC
  Administered 2019-11-30: 12:00:00 1000 mL via INTRAVENOUS

## 2019-11-30 MED ORDER — ACETAMINOPHEN 325 MG PO TABS: 650.0000 mg | ORAL_TABLET | Freq: Four times a day (QID) | ORAL | Status: DC | PRN

## 2019-11-30 MED ORDER — HEPARIN SODIUM (PORCINE) 5000 UNIT/ML IJ SOLN
5000.0000 [IU] | Freq: Three times a day (TID) | INTRAMUSCULAR | Status: DC
  Administered 2019-12-01 – 2019-12-02 (×4): 5000 [IU] via SUBCUTANEOUS
  Filled 2019-11-30 (×4): qty 1

## 2019-11-30 MED ORDER — CEFDINIR 300 MG PO CAPS
300.0000 mg | ORAL_CAPSULE | Freq: Two times a day (BID) | ORAL | Status: AC
  Administered 2019-11-30 – 2019-12-02 (×4): 300 mg via ORAL
  Filled 2019-11-30 (×4): qty 1

## 2019-11-30 MED ORDER — TORSEMIDE 20 MG PO TABS
20.0000 mg | ORAL_TABLET | Freq: Once | ORAL | Status: AC
  Administered 2019-11-30: 13:00:00 20 mg via ORAL
  Filled 2019-11-30: qty 1

## 2019-11-30 MED ORDER — ASPIRIN EC 81 MG PO TBEC
81.0000 mg | DELAYED_RELEASE_TABLET | Freq: Every day | ORAL | Status: DC
  Administered 2019-12-01 – 2019-12-02 (×2): 81 mg via ORAL
  Filled 2019-11-30 (×2): qty 1

## 2019-11-30 MED ORDER — INSULIN ASPART 100 UNIT/ML ~~LOC~~ SOLN
0.0000 [IU] | Freq: Three times a day (TID) | SUBCUTANEOUS | Status: DC
  Administered 2019-11-30: 17:00:00 2 [IU] via SUBCUTANEOUS
  Administered 2019-12-01: 13:00:00 1 [IU] via SUBCUTANEOUS

## 2019-11-30 MED ORDER — INSULIN ASPART 100 UNIT/ML ~~LOC~~ SOLN: 0.0000 [IU] | Freq: Every day | SUBCUTANEOUS | Status: DC

## 2019-11-30 MED ORDER — FAMOTIDINE 20 MG PO TABS
20.0000 mg | ORAL_TABLET | Freq: Every day | ORAL | Status: DC
  Administered 2019-12-01 – 2019-12-02 (×2): 20 mg via ORAL
  Filled 2019-11-30 (×2): qty 1

## 2019-11-30 MED ORDER — SODIUM BICARBONATE 650 MG PO TABS
650.0000 mg | ORAL_TABLET | Freq: Two times a day (BID) | ORAL | Status: DC
  Administered 2019-11-30 – 2019-12-01 (×2): 650 mg via ORAL
  Filled 2019-11-30 (×3): qty 1

## 2019-11-30 MED ORDER — TIMOLOL MALEATE 0.5 % OP SOLN
1.0000 [drp] | Freq: Two times a day (BID) | OPHTHALMIC | Status: DC
  Administered 2019-11-30 – 2019-12-02 (×4): 1 [drp] via OPHTHALMIC
  Filled 2019-11-30: qty 5

## 2019-11-30 MED ORDER — BRIMONIDINE TARTRATE 0.2 % OP SOLN
1.0000 [drp] | Freq: Two times a day (BID) | OPHTHALMIC | Status: DC
  Administered 2019-11-30 – 2019-12-02 (×4): 1 [drp] via OPHTHALMIC
  Filled 2019-11-30: qty 5

## 2019-11-30 NOTE — ED Provider Notes (Signed)
[facility] Emergency Department Provider Note MRN:  [id number]  Arrival date & time: 11/30/19     Chief Complaint   Abnormal Lab   History of Present Illness   Jerry Burns is a 86 y.o. year-old male with a history of CHF, diabetes presenting to the ED with chief complaint of abnormal lab.  Patient has had symptoms of general malaise, weakness, shortness of breath for several days.  Was diagnosed with multifocal pneumonia and has been on antibiotics for the past several days.  Sent here after follow-up labs were done at PCP office yesterday, revealing hyponatremia, hyperkalemia, and worsening renal function.  Patient continues to feel generally unwell, denies chest pain, no fever, no abdominal pain, no vomiting or diarrhea.  Very poor appetite.  Review of Systems  A complete 10 system review of systems was obtained and all systems are negative except as noted in the HPI and PMH.   Patient's Health History    Past Medical History:  Diagnosis Date  . Anginal pain ([place])   . Arthritis    Bilateral knee  . CHF (congestive heart failure) ([place])   . Coronary artery disease   . Diabetes mellitus    insulin dependent   . DVT, lower extremity, distal, acute ([place]) 09/09/2018  . Hyperlipidemia   . Hypertension   . Progressive angina ([place]) 03/2015   a. s/p DES to proximal LAD and balloon angioplasty to distal LAD in 2016 b. patent stent by repeat cath in 01/2017 with mild residual CAD)  . Stroke [facility])     Past Surgical History:  Procedure Laterality Date  . APPENDECTOMY    . CARDIAC CATHETERIZATION N/A 04/12/2015   Procedure: Left Heart Cath and Coronary Angiography;  Surgeon: Sherren Mocha, MD; pLAD 75>0% w/ 3.0x12 mm Resolute DES, dLAD 80>0% w/ Angiosculpt scoring balloon, CFX luminal irreg, RCA mild dz, EF 55-65%   . COLONOSCOPY  2011   RMR: 1. Normal rectum 2. Left sided diverticula , 2 ascending colon polyps, status post snare polypectomy. Remainder of  colonic mucosa appeared unremarkable.   . COLONOSCOPY N/A 05/13/2016   Procedure: COLONOSCOPY;  Surgeon: Daneil Dolin, MD;  [facility];  Service: Endoscopy;  Laterality: N/A;  7:30 AM  . COLONOSCOPY N/A 05/18/2018   Procedure: COLONOSCOPY;  Surgeon: Daneil Dolin, MD;  [facility];  Service: Endoscopy;  Laterality: N/A;  1:15pm  . CORONARY ANGIOGRAPHY N/A 02/04/2017   Procedure: CORONARY ANGIOGRAPHY (CATH LAB);  Surgeon: Troy Sine, MD;  Location: [place] CV LAB;  Service: Cardiovascular;  Laterality: N/A;  . CORONARY ANGIOPLASTY WITH STENT PLACEMENT  08/22/2014   PTCA/DES X mLAD with 2.5 x 16 mm Promus Premier DES by Dr Julianne Handler  . ESOPHAGOGASTRODUODENOSCOPY N/A 05/18/2018   Procedure: ESOPHAGOGASTRODUODENOSCOPY (EGD);  Surgeon: Daneil Dolin, MD;  [facility];  Service: Endoscopy;  Laterality: N/A;  . FOOT SURGERY Right   . LEFT HEART CATHETERIZATION WITH CORONARY ANGIOGRAM N/A 08/22/2014   Procedure: LEFT HEART CATHETERIZATION WITH CORONARY ANGIOGRAM;  Surgeon: Burnell Blanks, MD;  Location: [facility] CATH LAB;  Service: Cardiovascular;  Laterality: N/A;  . PERCUTANEOUS CORONARY STENT INTERVENTION (PCI-S)  08/22/2014   Procedure: PERCUTANEOUS CORONARY STENT INTERVENTION (PCI-S);  Surgeon: Burnell Blanks, MD;   . SHOULDER SURGERY    . TONSILLECTOMY      Family History  Problem Relation Age of Onset  . Diabetes Other   . Diabetes Brother   . Colon cancer Maternal  Uncle   . Skin cancer Mother   . Diabetes Father   . Hypertension Sister   . Skin cancer Sister     Social History   Socioeconomic History  . Marital status: Married    Spouse name: Not on file  . Number of children: Not on file  . Years of education: 30  . Highest education level: Not on file  Occupational History  . Occupation: retired  Tobacco Use  . Smoking status: Former Smoker    Packs/day: 1.00    Years: 20.00    Pack years: 20.00    Types:  Cigarettes, Cigars    Start date: 08/10/1955    Quit date: 07/28/2007    Years since quitting: 12.3  . Smokeless tobacco: Never Used  Substance and Sexual Activity  . Alcohol use: Not Currently    Alcohol/week: 0.0 standard drinks    Comment: Daily about 1 glass of wine  . Drug use: No  . Sexual activity: Not Currently  Other Topics Concern  . Not on file  Social History Narrative  . Not on file   Social Determinants of Health   Financial Resource Strain:   . Difficulty of Paying Living Expenses:   Food Insecurity:   . Worried About Charity fundraiser in the Last Year:   . Arboriculturist in the Last Year:   Transportation Needs:   . Film/video editor (Medical):   Marland Kitchen Lack of Transportation (Non-Medical):   Physical Activity:   . Days of Exercise per Week:   . Minutes of Exercise per Session:   Stress:   . Feeling of Stress :   Social Connections:   . Frequency of Communication with Friends and Family:   . Frequency of Social Gatherings with Friends and Family:   . Attends Religious Services:   . Active Member of Clubs or Organizations:   . Attends Archivist Meetings:   Marland Kitchen Marital Status:   Intimate Partner Violence:   . Fear of Current or Ex-Partner:   . Emotionally Abused:   Marland Kitchen Physically Abused:   . Sexually Abused:      Physical Exam   Vitals:   11/30/19 1030 11/30/19 1100  BP: 106/67 114/90  Pulse: 79 77  Resp: 19 (!) 23  Temp:    SpO2: 100% 98%    CONSTITUTIONAL: Well-appearing, NAD NEURO:  Alert and oriented x 3, no focal deficits EYES:  eyes equal and reactive ENT/NECK:  no LAD, no JVD CARDIO: Regular rate, well-perfused, normal S1 and S2 PULM:  CTAB no wheezing or rhonchi GI/GU:  normal bowel sounds, non-distended, non-tender MSK/SPINE:  No gross deformities, no edema SKIN:  no rash, atraumatic PSYCH:  Appropriate speech and behavior  *Additional and/or pertinent findings included in MDM below  Diagnostic and Interventional  Summary    EKG Interpretation  Date/Time:  Thursday Nov 30 2019 10:23:25 EDT Ventricular Rate:  93 PR Interval:    QRS Duration: 150 QT Interval:  400 QTC Calculation: 498 R Axis:   -51 Text Interpretation: Wandering atrial pacemaker Nonspecific IVCD with LAD Artifact in lead(s) I II aVR aVL and baseline wander in lead(s) V1 Confirmed by Gerlene Fee [phone]) on 11/30/2019 10:28:30 AM      Labs Reviewed  CBC - Abnormal; Notable for the following components:      Result Value   RBC 3.07 (*)    Hemoglobin 9.5 (*)    HCT 28.5 (*)    All other components  within normal limits  COMPREHENSIVE METABOLIC PANEL - Abnormal; Notable for the following components:   Sodium 123 (*)    Chloride 93 (*)    CO2 20 (*)    Glucose, Bld 179 (*)    BUN 48 (*)    Creatinine, Ser 2.00 (*)    Calcium 8.3 (*)    Albumin 3.4 (*)    AST 79 (*)    ALT 87 (*)    GFR calc non Af Amer 30 (*)    GFR calc Af Amer 34 (*)    All other components within normal limits  BRAIN NATRIURETIC PEPTIDE - Abnormal; Notable for the following components:   B Natriuretic Peptide 119.0 (*)    All other components within normal limits  RESPIRATORY PANEL BY RT PCR (FLU A&B, COVID)  URINALYSIS, ROUTINE W REFLEX MICROSCOPIC    DG Chest Port 1 View  Final Result      Medications  sodium chloride 0.9 % bolus 1,000 mL (has no administration in time range)     Procedures  /  Critical Care Procedures  ED Course and Medical Decision Making  I have reviewed the triage vital signs, the nursing notes, and pertinent available records from the EMR.  Listed above are laboratory and imaging tests that I personally ordered, reviewed, and interpreted and then considered in my medical decision making (see below for details).      Worsening hyponatremia, repeat labs here in the emergency department showed normal potassium.  Suspect this is related to poor appetite and PCP thinks that patient has been drinking only water and has  not been repeating his electrolytes.  Will request admission.    Barth Kirks. [place] Small, [place] [email or web address]  Final Clinical Impressions(s) / ED Diagnoses     ICD-10-CM   1. Hyponatremia  E87.1   2. AKI (acute kidney injury) ([place])  N17.9     ED Discharge Orders    None       Discharge Instructions Discussed with and Provided to Patient:   Discharge Instructions   None       Maudie Flakes, MD 11/30/19 [phone]

## 2019-11-30 NOTE — Telephone Encounter (Signed)
Nurses I did speak with the patient regarding his lab results He is being sent to the ER for further evaluation and probable admission Please connect with triage to let them know that he is coming I have already spoken with the ER doctor   Reason for going to the ER Hyponatremia, hyperkalemia, acute renal creatinine elevation Sodium 125, potassium 6.2 Shortness of breath, being treated for pneumonia Will need Covid testing as well

## 2019-11-30 NOTE — ED Notes (Signed)
ED TO INPATIENT HANDOFF REPORT  ED Nurse Name and Phone #: [phone]  S Name/Age/Gender Jerry Burns 86 y.o. male Room/Bed: APA14/APA14  Code Status   Code Status: Prior  Home/SNF/Other Home Patient oriented to: self, place, time and situation Is this baseline? Yes   Triage Complete: Triage complete  Chief Complaint Hyponatremia [E87.1]  Triage Note Sent by Dr Lance Sell office for abnormal labs.  Pt c/o SOB and weakness.  Denies any pain.      Allergies Allergies  Allergen Reactions  . Adhesive [Tape]     bandaids cause blisters after leaving on for 24 hours   . Sulfa Antibiotics Other (See Comments)    "was terrible" "eye went crazy"  . Latex Other (See Comments)    blisters    Level of Care/Admitting Diagnosis ED Disposition    ED Disposition Condition [facility] Area: [facility] [[id number]]  Level of Care: Med-Surg [16]  Covid Evaluation: Asymptomatic Screening Protocol (No Symptoms)  Diagnosis: Hyponatremia [741638]  Admitting Physician: Rodena Goldmann [[id number]]  Attending Physician: Rodena Goldmann [[id number]]  Estimated length of stay: past midnight tomorrow  Certification:: I certify this patient will need inpatient services for at least 2 midnights       B Medical/Surgery History Past Medical History:  Diagnosis Date  . Anginal pain ([place])   . Arthritis    Bilateral knee  . CHF (congestive heart failure) ([place])   . Coronary artery disease   . Diabetes mellitus    insulin dependent   . DVT, lower extremity, distal, acute ([place]) 09/09/2018  . Hyperlipidemia   . Hypertension   . Progressive angina ([place]) 03/2015   a. s/p DES to proximal LAD and balloon angioplasty to distal LAD in 2016 b. patent stent by repeat cath in 01/2017 with mild residual CAD)  . Stroke [facility])    Past Surgical History:  Procedure Laterality Date  . APPENDECTOMY    . CARDIAC CATHETERIZATION N/A 04/12/2015   Procedure: Left Heart Cath and Coronary  Angiography;  Surgeon: Sherren Mocha, MD; pLAD 75>0% w/ 3.0x12 mm Resolute DES, dLAD 80>0% w/ Angiosculpt scoring balloon, CFX luminal irreg, RCA mild dz, EF 55-65%   . COLONOSCOPY  2011   RMR: 1. Normal rectum 2. Left sided diverticula , 2 ascending colon polyps, status post snare polypectomy. Remainder of colonic mucosa appeared unremarkable.   . COLONOSCOPY N/A 05/13/2016   Procedure: COLONOSCOPY;  Surgeon: Daneil Dolin, MD;  [facility];  Service: Endoscopy;  Laterality: N/A;  7:30 AM  . COLONOSCOPY N/A 05/18/2018   Procedure: COLONOSCOPY;  Surgeon: Daneil Dolin, MD;  [facility];  Service: Endoscopy;  Laterality: N/A;  1:15pm  . CORONARY ANGIOGRAPHY N/A 02/04/2017   Procedure: CORONARY ANGIOGRAPHY (CATH LAB);  Surgeon: Troy Sine, MD;  Location: [place] CV LAB;  Service: Cardiovascular;  Laterality: N/A;  . CORONARY ANGIOPLASTY WITH STENT PLACEMENT  08/22/2014   PTCA/DES X mLAD with 2.5 x 16 mm Promus Premier DES by Dr Julianne Handler  . ESOPHAGOGASTRODUODENOSCOPY N/A 05/18/2018   Procedure: ESOPHAGOGASTRODUODENOSCOPY (EGD);  Surgeon: Daneil Dolin, MD;  [facility];  Service: Endoscopy;  Laterality: N/A;  . FOOT SURGERY Right   . LEFT HEART CATHETERIZATION WITH CORONARY ANGIOGRAM N/A 08/22/2014   Procedure: LEFT HEART CATHETERIZATION WITH CORONARY ANGIOGRAM;  Surgeon: Burnell Blanks, MD;  Location: [facility] CATH LAB;  Service: Cardiovascular;  Laterality: N/A;  . PERCUTANEOUS CORONARY STENT INTERVENTION (PCI-S)  08/22/2014   Procedure: PERCUTANEOUS CORONARY STENT INTERVENTION (PCI-S);  Surgeon: Burnell Blanks, MD;   . SHOULDER SURGERY    . TONSILLECTOMY       A IV Location/Drains/Wounds Patient Lines/Drains/Airways Status   Active Line/Drains/Airways    Name:   Placement date:   Placement time:   Site:   Days:   Peripheral IV 11/26/19 Right Forearm   11/26/19    1243    Forearm   4   Peripheral IV 11/30/19 Right Antecubital    11/30/19    1053    Antecubital   less than 1          Intake/Output Last 24 hours  Intake/Output Summary (Last 24 hours) at 11/30/2019 1349 Last data filed at 11/30/2019 1228 Gross per 24 hour  Intake 500 ml  Output -  Net 500 ml    Labs/Imaging Results for orders placed or performed during the hospital encounter of 11/30/19 (from the past 48 hour(s))  CBC     Status: Abnormal   Collection Time: 11/30/19 10:43 AM  Result Value Ref Range   WBC 8.3 4.0 - 10.5 K/uL   RBC 3.07 (L) 4.22 - 5.81 MIL/uL   Hemoglobin 9.5 (L) 13.0 - 17.0 g/dL   HCT 28.5 (L) 39.0 - 52.0 %   MCV 92.8 80.0 - 100.0 fL   MCH 30.9 26.0 - 34.0 pg   MCHC 33.3 30.0 - 36.0 g/dL   RDW 12.3 11.5 - 15.5 %   Platelets 287 150 - 400 K/uL   nRBC 0.0 0.0 - 0.2 %    Comment: Performed at [facility], [address], [place], [place] [address]  Comprehensive metabolic panel     Status: Abnormal   Collection Time: 11/30/19 10:43 AM  Result Value Ref Range   Sodium 123 (L) 135 - 145 mmol/L   Potassium 4.9 3.5 - 5.1 mmol/L   Chloride 93 (L) 98 - 111 mmol/L   CO2 20 (L) 22 - 32 mmol/L   Glucose, Bld 179 (H) 70 - 99 mg/dL    Comment: Glucose reference range applies only to samples taken after fasting for at least 8 hours.   BUN 48 (H) 8 - 23 mg/dL   Creatinine, Ser 2.00 (H) 0.61 - 1.24 mg/dL   Calcium 8.3 (L) 8.9 - 10.3 mg/dL   Total Protein 6.7 6.5 - 8.1 g/dL   Albumin 3.4 (L) 3.5 - 5.0 g/dL   AST 79 (H) 15 - 41 U/L   ALT 87 (H) 0 - 44 U/L   Alkaline Phosphatase 121 38 - 126 U/L   Total Bilirubin 0.7 0.3 - 1.2 mg/dL   GFR calc non Af Amer 30 (L) >60 mL/min   GFR calc Af Amer 34 (L) >60 mL/min   Anion gap 10 5 - 15    Comment: Performed at [facility], [address] [place], [place] [address]  Brain natriuretic peptide     Status: Abnormal   Collection Time: 11/30/19 10:43 AM  Result Value Ref Range   B Natriuretic Peptide 119.0 (H) 0.0 - 100.0 pg/mL    Comment: Performed at [facility], [address],  [place], [place] [address]  TSH     Status: None   Collection Time: 11/30/19 10:43 AM  Result Value Ref Range   TSH 4.149 0.350 - 4.500 uIU/mL    Comment: Performed by a 3rd Generation assay with a functional sensitivity of <=0.01 uIU/mL. Performed at [facility], [address]  [place], [place] [address]   Respiratory Panel by RT PCR (Flu A&B, Covid) - Nasopharyngeal Swab     Status: None   Collection Time: 11/30/19 11:07 AM   Specimen: Nasopharyngeal Swab  Result Value Ref Range   SARS Coronavirus 2 by RT PCR NEGATIVE NEGATIVE    Comment: (NOTE) SARS-CoV-2 target nucleic acids are NOT DETECTED. The SARS-CoV-2 RNA is generally detectable in upper respiratoy specimens during the acute phase of infection. The lowest concentration of SARS-CoV-2 viral copies this assay can detect is 131 copies/mL. A negative result does not preclude SARS-Cov-2 infection and should not be used as the sole basis for treatment or other patient management decisions. A negative result may occur with  improper specimen collection/handling, submission of specimen other than nasopharyngeal swab, presence of viral mutation(s) within the areas targeted by this assay, and inadequate number of viral copies (<131 copies/mL). A negative result must be combined with clinical observations, patient history, and epidemiological information. The expected result is Negative. Fact Sheet for Patients:  PinkCheek.be Fact Sheet for Healthcare Providers:  GravelBags.it This test is not yet ap proved or cleared by the [place] FDA and  has been authorized for detection and/or diagnosis of SARS-CoV-2 by FDA under an Emergency Use Authorization (EUA). This EUA will remain  in effect (meaning this test can be used) for the duration of the COVID-19 declaration under Section 564(b)(1) of the Act, 21 U.S.C. section 360bbb-3(b)(1), unless the authorization is terminated  or revoked sooner.    Influenza A by PCR NEGATIVE NEGATIVE   Influenza B by PCR NEGATIVE NEGATIVE    Comment: (NOTE) The Xpert Xpress SARS-CoV-2/FLU/RSV assay is intended as an aid in  the diagnosis of influenza from Nasopharyngeal swab specimens and  should not be used as a sole basis for treatment. Nasal washings and  aspirates are unacceptable for Xpert Xpress SARS-CoV-2/FLU/RSV  testing. Fact Sheet for Patients: PinkCheek.be Fact Sheet for Healthcare Providers: GravelBags.it This test is not yet approved or cleared by the [place] FDA and  has been authorized for detection and/or diagnosis of SARS-CoV-2 by  FDA under an Emergency Use Authorization (EUA). This EUA will remain  in effect (meaning this test can be used) for the duration of the  Covid-19 declaration under Section 564(b)(1) of the Act, 21  U.S.C. section 360bbb-3(b)(1), unless the authorization is  terminated or revoked. Performed at [facility], [address], [place], [place] [address]   Urinalysis, Routine w reflex microscopic     Status: Abnormal   Collection Time: 11/30/19 12:21 PM  Result Value Ref Range   Color, Urine YELLOW YELLOW   APPearance HAZY (A) CLEAR   Specific Gravity, Urine 1.012 1.005 - 1.030   pH 5.0 5.0 - 8.0   Glucose, UA NEGATIVE NEGATIVE mg/dL   Hgb urine dipstick NEGATIVE NEGATIVE   Bilirubin Urine NEGATIVE NEGATIVE   Ketones, ur NEGATIVE NEGATIVE mg/dL   Protein, ur NEGATIVE NEGATIVE mg/dL   Nitrite NEGATIVE NEGATIVE   Leukocytes,Ua NEGATIVE NEGATIVE    Comment: Performed at [facility], [address], [place], [place] [address]  Creatinine, urine, random     Status: None   Collection Time: 11/30/19 12:21 PM  Result Value Ref Range   Creatinine, Urine 107.09 mg/dL    Comment: Performed at [facility], [address] [place], [place] [address]  Sodium, urine, random     Status: None   Collection Time: 11/30/19 12:21  PM  Result Value Ref Range   Sodium, Ur <102  mmol/L    Comment: Performed at [facility], [address], [place], [place] [address]   DG Chest Port 1 View  Result Date: 11/30/2019 CLINICAL DATA:  Shortness of breath. Weakness. Pneumonia. EXAM: PORTABLE CHEST 1 VIEW COMPARISON:  11/26/2019 FINDINGS: The heart size and mediastinal contours are within normal limits. Aortic atherosclerosis incidentally noted. Bilateral calcified pleural plaque again seen, consistent with asbestos related pleural disease. Stable mild left basilar pleural-parenchymal scarring. No evidence of acute pulmonary infiltrate or edema. IMPRESSION: Stable left basilar pleural-parenchymal scarring scarring and asbestos related pleural disease. No acute findings. Electronically Signed   By: Marlaine Hind M.D.   On: 11/30/2019 11:00    Pending Labs Unresulted Labs (From admission, onward)    Start     Ordered   11/30/19 1129  Osmolality, urine  Once,   STAT     11/30/19 1128   11/30/19 1129  Osmolality  Once,   STAT     11/30/19 1128          Vitals/Pain Today's Vitals   11/30/19 1130 11/30/19 1200 11/30/19 1230 11/30/19 1300  BP: 116/72 119/73 121/74 124/83  Pulse: 75 74 73 74  Resp: 15 (!) 24 11 12   Temp:      TempSrc:      SpO2: 99% 97% 99% 98%  Weight:      Height:      PainSc:        Isolation Precautions No active isolations  Medications Medications  sodium chloride 0.9 % bolus 1,000 mL (0 mLs Intravenous Stopped 11/30/19 1228)  torsemide (DEMADEX) tablet 20 mg (20 mg Oral Given 11/30/19 1305)    Mobility walks Low fall risk   Focused Assessments    R Recommendations: See Admitting Provider Note  Report given to:   Additional Notes:

## 2019-11-30 NOTE — ED Triage Notes (Signed)
Sent by Dr Lance Sell office for abnormal labs.  Pt c/o SOB and weakness.  Denies any pain.

## 2019-11-30 NOTE — H&P (Addendum)
History and Physical    Jerry Burns TIW:[id number] DOB: [birth year] DOA: 11/30/2019  PCP: Kathyrn Drown, MD   Patient coming from: Home  Chief Complaint: Electrolyte abnormalities with general malaise/weakness/shortness of breath  HPI: Jerry Burns is a 86 y.o. male with medical history significant for recent diagnosis of multifocal pneumonia with initiation of antibiotics, diastolic CHF, obesity, CAD, type 2 diabetes, dyslipidemia, hypertension, and CKD stage IIIa who was sent to the ED by his PCP given hyponatremia as well as some hyperkalemia and AKI.  He was diagnosed with multifocal pneumonia recently in the ED on 5/2 and has been taking Omnicef with improvement in some of his symptoms.  He states that his appetite has been generally poor and he is not eating very much overall, but is drinking plenty of water, up to 12 glasses of water a day.  He generally feels unwell, but is not confused.  He denies any abdominal pain, vomiting, or diarrhea.  No chest pain, shortness of breath, cough, fevers, or chills noted.   ED Course: Stable vital signs noted with sodium level of 123.  Potassium within normal limits.  Creatinine of 2 noted and BNP 119.  Chest x-ray with no acute findings noted.  He has received 1 L IV fluid bolus in the ED.  He appears to have distended abdomen with lower extremity edema.  Plan will be to fluid restrict for now and start torsemide to assist with diuresis.  Review of Systems: All others reviewed as noted above and otherwise negative.  Past Medical History:  Diagnosis Date  . Anginal pain ([place])   . Arthritis    Bilateral knee  . CHF (congestive heart failure) ([place])   . Coronary artery disease   . Diabetes mellitus    insulin dependent   . DVT, lower extremity, distal, acute ([place]) 09/09/2018  . Hyperlipidemia   . Hypertension   . Progressive angina ([place]) 03/2015   a. s/p DES to proximal LAD and balloon angioplasty to distal LAD in 2016 b. patent stent by  repeat cath in 01/2017 with mild residual CAD)  . Stroke [facility])     Past Surgical History:  Procedure Laterality Date  . APPENDECTOMY    . CARDIAC CATHETERIZATION N/A 04/12/2015   Procedure: Left Heart Cath and Coronary Angiography;  Surgeon: Sherren Mocha, MD; pLAD 75>0% w/ 3.0x12 mm Resolute DES, dLAD 80>0% w/ Angiosculpt scoring balloon, CFX luminal irreg, RCA mild dz, EF 55-65%   . COLONOSCOPY  2011   RMR: 1. Normal rectum 2. Left sided diverticula , 2 ascending colon polyps, status post snare polypectomy. Remainder of colonic mucosa appeared unremarkable.   . COLONOSCOPY N/A 05/13/2016   Procedure: COLONOSCOPY;  Surgeon: Daneil Dolin, MD;  [facility];  Service: Endoscopy;  Laterality: N/A;  7:30 AM  . COLONOSCOPY N/A 05/18/2018   Procedure: COLONOSCOPY;  Surgeon: Daneil Dolin, MD;  [facility];  Service: Endoscopy;  Laterality: N/A;  1:15pm  . CORONARY ANGIOGRAPHY N/A 02/04/2017   Procedure: CORONARY ANGIOGRAPHY (CATH LAB);  Surgeon: Troy Sine, MD;  Location: [place] CV LAB;  Service: Cardiovascular;  Laterality: N/A;  . CORONARY ANGIOPLASTY WITH STENT PLACEMENT  08/22/2014   PTCA/DES X mLAD with 2.5 x 16 mm Promus Premier DES by Dr Julianne Handler  . ESOPHAGOGASTRODUODENOSCOPY N/A 05/18/2018   Procedure: ESOPHAGOGASTRODUODENOSCOPY (EGD);  Surgeon: Daneil Dolin, MD;  [facility];  Service: Endoscopy;  Laterality: N/A;  . FOOT SURGERY Right   .  LEFT HEART CATHETERIZATION WITH CORONARY ANGIOGRAM N/A 08/22/2014   Procedure: LEFT HEART CATHETERIZATION WITH CORONARY ANGIOGRAM;  Surgeon: Burnell Blanks, MD;  Location[facility];  Service: Cardiovascular;  Laterality: N/A;  . PERCUTANEOUS CORONARY STENT INTERVENTION (PCI-S)  08/22/2014   Procedure: PERCUTANEOUS CORONARY STENT INTERVENTION (PCI-S);  Surgeon: Burnell Blanks, MD;   . SHOULDER SURGERY    . TONSILLECTOMY       reports that he quit smoking about 12 years ago. His  smoking use included cigarettes and cigars. He started smoking about 64 years ago. He has a 20.00 pack-year smoking history. He has never used smokeless tobacco. He reports previous alcohol use. He reports that he does not use drugs.  Allergies  Allergen Reactions  . Adhesive [Tape]     bandaids cause blisters after leaving on for 24 hours   . Sulfa Antibiotics Other (See Comments)    "was terrible" "eye went crazy"  . Latex Other (See Comments)    blisters    Family History  Problem Relation Age of Onset  . Diabetes Other   . Diabetes Brother   . Colon cancer Maternal Uncle   . Skin cancer Mother   . Diabetes Father   . Hypertension Sister   . Skin cancer Sister     Prior to Admission medications   Medication Sig Start Date End Date Taking? Authorizing Provider  amLODipine (NORVASC) 5 MG tablet TAKE (1) TABLET BY MOUTH ONCE DAILY. Patient taking differently: Take 5 mg by mouth daily. TAKE (1) TABLET BY MOUTH ONCE DAILY. 10/05/19  Yes Kathyrn Drown, MD  Ascorbic Acid (VITAMIN C) 1000 MG tablet Take 500 mg by mouth in the morning and at bedtime.    Yes [provider]  aspirin EC 81 MG tablet Take 81 mg by mouth daily.   Yes [provider]  atorvastatin (LIPITOR) 80 MG tablet TAKE (1) TABLET BY MOUTH ONCE DAILY. Patient taking differently: Take 80 mg by mouth daily. TAKE (1) TABLET BY MOUTH ONCE DAILY. 05/01/19  Yes Luking, Elayne Snare, MD  carvedilol (COREG) 12.5 MG tablet TAKE (1) TABLET BY MOUTH TWICE DAILY. Patient taking differently: Take 12.5 mg by mouth in the morning and at bedtime. TAKE (1) TABLET BY MOUTH TWICE DAILY. 02/13/19  Yes Herminio Commons, MD  cefdinir (OMNICEF) 300 MG capsule Take 1 capsule (300 mg total) by mouth 2 (two) times daily. 11/29/19  Yes Luking, Scott A, MD  COMBIGAN 0.2-0.5 % ophthalmic solution Place 1 drop into the left eye 2 (two) times daily.  09/06/15  Yes [provider]  Cyanocobalamin (B-12) 2500 MCG TABS Take 1 tablet  by mouth 3 (three) times a week. Three times a week on Monday Wednesday and Friday    Yes [provider]  dutasteride (AVODART) 0.5 MG capsule TAKE ONE CAPSULE BY MOUTH ONCE DAILY. Patient taking differently: Take 0.5 mg by mouth daily.  08/14/19  Yes Luking, Elayne Snare, MD  Emollient (CERAVE) CREA Apply 1 application topically daily at 6 (six) AM.    Yes [provider]  erythromycin ophthalmic ointment Place 1 application into the left eye 3 (three) times daily as needed (infection).  07/17/19  Yes [provider]  famotidine (PEPCID) 20 MG tablet Take one tablet po daily Patient taking differently: Take 20 mg by mouth daily. Take one tablet po daily 11/02/19  Yes Luking, Scott A, MD  Flaxseed, Linseed, (FLAXSEED OIL PO) Take 1 capsule by mouth daily.  Yes [provider]  insulin lispro (HUMALOG KWIKPEN) 100 UNIT/ML KwikPen Inject twice daily per following: Breakfast 2 units; if 150-250 then 3 units; if greater than 250 5 units. Supper-3 units but if 150-250 do 4 units; if greater that 250 6 units Patient taking differently: Inject 2-4 Units into the skin See admin instructions. Inject 2 units at breakfast and 4 units at supper daily 11/24/19  Yes Luking, Scott A, MD  Lactobacillus (PROBIOTIC ACIDOPHILUS) CAPS Take 1 capsule by mouth daily at 6 (six) AM.   Yes [provider]  LANTUS SOLOSTAR 100 UNIT/ML Solostar Pen INJECT 22 UNITS SUBCUTANEOUSLY ONCE DAILY AT 10PM Patient taking differently: Inject 22 Units into the skin every evening.  11/14/19  Yes Luking, Scott A, MD  mometasone (ELOCON) 0.1 % cream APPLY TO AFFECTED AREAS ONCE DAILY. Patient taking differently: Apply 1 application topically daily as needed.  09/26/19  Yes Kathyrn Drown, MD  Multiple Vitamin (MULTIVITAMIN) tablet Take 1 tablet by mouth daily.   Yes [provider]  Polyvinyl Alcohol-Povidone (REFRESH OP) Place 1 drop into both ears 2 (two) times daily as needed (dryness).     Yes [provider]  psyllium (METAMUCIL) 58.6 % powder Take 1 packet by mouth daily.    Yes [provider]  sitaGLIPtin (JANUVIA) 100 MG tablet TAKE (1) TABLET BY MOUTH ONCE DAILY. Patient taking differently: Take 100 mg by mouth daily. TAKE (1) TABLET BY MOUTH ONCE DAILY. 05/01/19  Yes Kathyrn Drown, MD  tamsulosin (FLOMAX) 0.4 MG CAPS capsule TAKE (1) CAPSULE BY MOUTH ONCE DAILY. Patient taking differently: Take 0.4 mg by mouth daily.  08/14/19  Yes Kathyrn Drown, MD  torsemide (DEMADEX) 20 MG tablet Take one tablet qam prn edema Patient taking differently: Take 20 mg by mouth daily. 1/2 tablet every morning 05/01/19  Yes Luking, Scott A, MD  Turmeric 500 MG CAPS Take 500 mg by mouth daily.   Yes [provider]  glucose blood (CONTOUR NEXT TEST) test strip USE 1 STRIP TO CHECK GLUCOSE THREE TIMES DAILY. FOR ICD 10 E11.9 Patient not taking: Reported on 11/30/2019 04/04/19   Kathyrn Drown, MD  metFORMIN (GLUCOPHAGE) 500 MG tablet Take one tablet po two times daily Patient not taking: Reported on 11/30/2019 11/02/19   Kathyrn Drown, MD  nitroGLYCERIN (NITROSTAT) 0.4 MG SL tablet PLACE 1 TAB UNDER TONGUE EVERY 5 MIN IF NEEDED FOR CHEST PAIN. MAY USE 3 TIMES.NO RELIEF CALL 911. Patient taking differently: Place 0.4 mg under the tongue every 5 (five) minutes as needed. PLACE 1 TAB UNDER TONGUE EVERY 5 MIN IF NEEDED FOR CHEST PAIN. MAY USE 3 TIMES.NO RELIEF CALL 911. 01/31/19   Erma Heritage, [place]    Physical Exam: Vitals:   11/30/19 1130 11/30/19 1200 11/30/19 1230 11/30/19 1300  BP: 116/72 119/73 121/74 124/83  Pulse: 75 74 73 74  Resp: 15 (!) 24 11 12   Temp:      TempSrc:      SpO2: 99% 97% 99% 98%  Weight:      Height:        Constitutional: NAD, calm, comfortable Vitals:   11/30/19 1130 11/30/19 1200 11/30/19 1230 11/30/19 1300  BP: 116/72 119/73 121/74 124/83  Pulse: 75 74 73 74  Resp: 15 (!) 24 11 12   Temp:      TempSrc:      SpO2: 99% 97% 99%  98%  Weight:      Height:  Eyes: lids and conjunctivae normal ENMT: Mucous membranes are moist.  Neck: normal, supple Respiratory: clear to auscultation bilaterally. Normal respiratory effort. No accessory muscle use.  Cardiovascular: Regular rate and rhythm, no murmurs. No extremity edema. Abdomen: no tenderness, mild to moderate distention. Bowel sounds positive.  Musculoskeletal: Bilateral 1-2+ pitting edema noted Skin: no rashes, lesions, ulcers.  Psychiatric: Normal judgment and insight. Alert and oriented x 3. Normal mood.   Labs on Admission: I have personally reviewed following labs and imaging studies  CBC: Recent Labs  Lab 11/26/19 1250 11/29/19 1205 11/30/19 1043  WBC 8.9 9.0 8.3  NEUTROABS 7.0 6.9  --   HGB 9.5* 9.8* 9.5*  HCT 29.4* 29.0* 28.5*  MCV 95.8 93 92.8  PLT 256 292 518   Basic Metabolic Panel: Recent Labs  Lab 11/26/19 1250 11/29/19 1205 11/30/19 1043  NA 130* 125* 123*  K 4.8 6.3* 4.9  CL 98 92* 93*  CO2 23 19* 20*  GLUCOSE 204* 156* 179*  BUN 36* 44* 48*  CREATININE 1.39* 1.96* 2.00*  CALCIUM 8.5* 8.8 8.3*   GFR: Estimated Creatinine Clearance: 33.1 mL/min (A) (by C-G formula based on SCr of 2 mg/dL (H)). Liver Function Tests: Recent Labs  Lab 11/26/19 1251 11/29/19 1205 11/30/19 1043  AST 30 42* 79*  ALT 36 53* 87*  ALKPHOS 87 122* 121  BILITOT 0.6 0.5 0.7  PROT 6.9 6.4 6.7  ALBUMIN 3.8 3.8 3.4*   Recent Labs  Lab 11/26/19 1251  LIPASE 23   No results for input(s): AMMONIA in the last 168 hours. Coagulation Profile: No results for input(s): INR, PROTIME in the last 168 hours. Cardiac Enzymes: No results for input(s): CKTOTAL, CKMB, CKMBINDEX, TROPONINI in the last 168 hours. BNP (last 3 results) No results for input(s): PROBNP in the last 8760 hours. HbA1C: No results for input(s): HGBA1C in the last 72 hours. CBG: No results for input(s): GLUCAP in the last 168 hours. Lipid Profile: No results for input(s):  CHOL, HDL, LDLCALC, TRIG, CHOLHDL, LDLDIRECT in the last 72 hours. Thyroid Function Tests: Recent Labs    11/30/19 1043  TSH 4.149   Anemia Panel: Recent Labs    11/29/19 1205  VITAMINB12 >2000*  FERRITIN 1,111*   Urine analysis:    Component Value Date/Time   COLORURINE YELLOW 11/30/2019 1221   APPEARANCEUR HAZY (A) 11/30/2019 1221   LABSPEC 1.012 11/30/2019 1221   PHURINE 5.0 11/30/2019 1221   GLUCOSEU NEGATIVE 11/30/2019 1221   HGBUR NEGATIVE 11/30/2019 1221   HGBUR trace-intact 01/12/2007 1307   BILIRUBINUR NEGATIVE 11/30/2019 1221   KETONESUR NEGATIVE 11/30/2019 1221   PROTEINUR NEGATIVE 11/30/2019 1221   UROBILINOGEN 0.2 01/12/2007 1307   NITRITE NEGATIVE 11/30/2019 [place] 11/30/2019 1221    Radiological Exams on Admission: DG Chest Port 1 View  Result Date: 11/30/2019 CLINICAL DATA:  Shortness of breath. Weakness. Pneumonia. EXAM: PORTABLE CHEST 1 VIEW COMPARISON:  11/26/2019 FINDINGS: The heart size and mediastinal contours are within normal limits. Aortic atherosclerosis incidentally noted. Bilateral calcified pleural plaque again seen, consistent with asbestos related pleural disease. Stable mild left basilar pleural-parenchymal scarring. No evidence of acute pulmonary infiltrate or edema. IMPRESSION: Stable left basilar pleural-parenchymal scarring scarring and asbestos related pleural disease. No acute findings. Electronically Signed   By: Marlaine Hind M.D.   On: 11/30/2019 11:00    EKG: Independently reviewed. 93bpm. SR.  Assessment/Plan Active Problems:   Hyponatremia    Symptomatic hyponatremia-hypervolemic -Appears to be multifactorial with volume overload  as well as decreased solute intake as well as increased fluid intake -Plan to fluid restrict for now and start torsemide -Unfortunately he did receive 1 L of IV fluid with normal saline in ED despite volume overload -May need to consider tolvaptan if he does not demonstrate  improvement by tomorrow -No changes in mentation or urgent need for correction currently noted -Urine and serum osmolarity as well as TSH ordered  AKI on CKD stage IIIa -Baseline creatinine 1.3-1.4 with current creatinine of 2 -Appears volume overloaded and some of this may be cardiorenal -With some noted acidosis for which I will give sodium bicarbonate -FeNa 1.6% suggesting intrinsic AKI. Plan to obtain renal U/S. Will consider Nephrology evaluation in am pending further results and response to treatment. -Avoid further IV fluid and maintain on torsemide as prescribed -Fluid restriction -Strict I/O's  Recent diagnosis of multifocal pneumonia -Covid testing today is noted to be negative -He was recently started on Omnicef for treatment of this pneumonia which I will continue for 2 more days for total of 5-day course of treatment  Type 2 diabetes/obesity -Hold Metformin and place on SSI -Hold Januvia -Carb modified diet  CAD -Continue on aspirin, statin, and Coreg  Hypertension-stable -Hold amlodipine for now while on torsemide 20 mg daily  History of diastolic CHF -LVEF 37-34% on 2D echocardiogram 08/2018 -No signs of overt congestive heart failure currently noted, but he is volume overloaded and is complaining of dyspnea and may have some mild symptoms -We will start torsemide 20 mg daily today and monitor AKI  BPH -Continue Flomax and Avodart  GERD -Continue famotidine   DVT prophylaxis: Heparin Code Status: Full Family Communication: None at bedside Disposition Plan:Admit for treatment of hyponatremia and management of AKI Consults called:None Admission status: Inpatient, MedSurg Status is: Inpatient  Remains inpatient appropriate because:Persistent severe electrolyte disturbances   Dispo: The patient is from: Home              Anticipated d/c is to: Home              Anticipated d/c date is: 2 days              Patient currently is not medically stable to  d/c.  He will require some fluid restriction and diuresis for management of his hyponatremia as well as further evaluation of his AKI while inpatient.   Jerry Burns D Manuella Ghazi DO [facility]  If 7PM-7AM, please contact night-coverage [email or web address]  11/30/2019, 1:31 PM

## 2019-11-30 NOTE — Telephone Encounter (Signed)
Spoke to triage and advised them pt was on his way.

## 2019-12-01 LAB — GLUCOSE, CAPILLARY
Glucose-Capillary: 100 mg/dL — ABNORMAL HIGH (ref 70–99)
Glucose-Capillary: 132 mg/dL — ABNORMAL HIGH (ref 70–99)
Glucose-Capillary: 133 mg/dL — ABNORMAL HIGH (ref 70–99)
Glucose-Capillary: 146 mg/dL — ABNORMAL HIGH (ref 70–99)

## 2019-12-01 LAB — COMPREHENSIVE METABOLIC PANEL
ALT: 100 U/L — ABNORMAL HIGH (ref 0–44)
AST: 74 U/L — ABNORMAL HIGH (ref 15–41)
Albumin: 3.4 g/dL — ABNORMAL LOW (ref 3.5–5.0)
Alkaline Phosphatase: 120 U/L (ref 38–126)
Anion gap: 12 (ref 5–15)
BUN: 49 mg/dL — ABNORMAL HIGH (ref 8–23)
CO2: 23 mmol/L (ref 22–32)
Calcium: 8.7 mg/dL — ABNORMAL LOW (ref 8.9–10.3)
Chloride: 94 mmol/L — ABNORMAL LOW (ref 98–111)
Creatinine, Ser: 2.06 mg/dL — ABNORMAL HIGH (ref 0.61–1.24)
GFR calc Af Amer: 33 mL/min — ABNORMAL LOW (ref >60)
GFR calc non Af Amer: 29 mL/min — ABNORMAL LOW (ref >60)
Glucose, Bld: 115 mg/dL — ABNORMAL HIGH (ref 70–99)
Potassium: 4.9 mmol/L (ref 3.5–5.1)
Sodium: 129 mmol/L — ABNORMAL LOW (ref 135–145)
Total Bilirubin: 0.7 mg/dL (ref 0.3–1.2)
Total Protein: 6.7 g/dL (ref 6.5–8.1)

## 2019-12-01 LAB — CBC
HCT: 28.5 % — ABNORMAL LOW (ref 39.0–52.0)
Hemoglobin: 9.3 g/dL — ABNORMAL LOW (ref 13.0–17.0)
MCH: 30.8 pg (ref 26.0–34.0)
MCHC: 32.6 g/dL (ref 30.0–36.0)
MCV: 94.4 fL (ref 80.0–100.0)
Platelets: 284 10*3/uL (ref 150–400)
RBC: 3.02 MIL/uL — ABNORMAL LOW (ref 4.22–5.81)
RDW: 12.5 % (ref 11.5–15.5)
WBC: 7.6 10*3/uL (ref 4.0–10.5)
nRBC: 0 % (ref 0.0–0.2)

## 2019-12-01 LAB — MAGNESIUM: Magnesium: 2.4 mg/dL (ref 1.7–2.4)

## 2019-12-01 NOTE — Care Management Important Message (Signed)
Important Message  Patient Details  Name: Jerry Burns MRN: [id number] Date of Birth: 03-12-1934   Medicare Important Message Given:  Yes     Tommy Medal 12/01/2019, 3:46 PM

## 2019-12-01 NOTE — Consult Note (Signed)
Jerry Burns Admit Date: 11/30/2019 12/01/2019 Jerry Burns Requesting Physician:  Eligah East  Reason for Consult:  AKI, hyponatremia HPI:  22M past medical history including diastolic CHF on torsemide, CAD, DM 2 on Metformin, hyperlipidemia, CKD 3 with baseline creatinine around 1.4.  He was seen in the emergency room on 5/2 for shortness of breath.  At that time he had a CTA which was negative for PE but identified likely multifocal pneumonia and he was placed on Omnicef and discharged to close follow-up.  Creatinine was 1.4 at that time.  At home, he did not have much of an appetite but did try to push his fluids drinking quite a bit of water.  He returned after labs and primary care identified AKI, hyponatremia, hyperkalemia.  This was on 5/5 and his sodium was 125, K6.3, creatinine 1.96.  He was admitted, fluid restricted, and sodium has improved to 129 here today.  Potassium has normalized.  Unfortunately creatinine has not changed very much.  Urinalysis on 5/6 without proteinuria, leukocytes, hemoglobin.  Abdominal ultrasound on 5/6 with normal-sized kidneys bilaterally without hydronephrosis or mass present.  Urine output has not been properly quantified.  He is not on an ACE inhibitor or ARB at home.  He has not been using NSAIDs.  He feels feels improved regarding his dyspnea.  He still describes significant bloating and early satiety with food.  He does have 1+ lower extremity edema extinguishing distal to the mid shins.   Creat (mg/dL)  Date Value  04/23/2014 0.93  10/26/2013 0.89  12/21/2012 1.01   Creatinine, Ser (mg/dL)  Date Value  12/01/2019 2.06 (H)  11/30/2019 2.00 (H)  11/29/2019 1.96 (H)  11/26/2019 1.39 (H)  10/26/2019 1.36 (H)  09/21/2019 1.32 (H)  07/27/2019 1.41 (H)  06/05/2019 1.41 (H)  05/16/2019 1.35 (H)  04/26/2019 1.13  ] I/Os: I/O last 3 completed shifts: In: 500 [IV Piggyback:500] Out: 85 [Urine:850]   ROS Balance of 12 systems is negative w/  exceptions as above  PMH  Past Medical History:  Diagnosis Date  . Anginal pain ([place])   . Arthritis    Bilateral knee  . CHF (congestive heart failure) ([place])   . Coronary artery disease   . Diabetes mellitus    insulin dependent   . DVT, lower extremity, distal, acute ([place]) 09/09/2018  . Hyperlipidemia   . Hypertension   . Progressive angina ([place]) 03/2015   a. s/p DES to proximal LAD and balloon angioplasty to distal LAD in 2016 b. patent stent by repeat cath in 01/2017 with mild residual CAD)  . Stroke [facility])    [place]  Past Surgical History:  Procedure Laterality Date  . APPENDECTOMY    . CARDIAC CATHETERIZATION N/A 04/12/2015   Procedure: Left Heart Cath and Coronary Angiography;  Surgeon: Sherren Mocha, MD; pLAD 75>0% w/ 3.0x12 mm Resolute DES, dLAD 80>0% w/ Angiosculpt scoring balloon, CFX luminal irreg, RCA mild dz, EF 55-65%   . COLONOSCOPY  2011   RMR: 1. Normal rectum 2. Left sided diverticula , 2 ascending colon polyps, status post snare polypectomy. Remainder of colonic mucosa appeared unremarkable.   . COLONOSCOPY N/A 05/13/2016   Procedure: COLONOSCOPY;  Surgeon: Daneil Dolin, MD;  [facility];  Service: Endoscopy;  Laterality: N/A;  7:30 AM  . COLONOSCOPY N/A 05/18/2018   Procedure: COLONOSCOPY;  Surgeon: Daneil Dolin, MD;  Location[facility];  Service: Endoscopy;  Laterality: N/A;  1:15pm  . CORONARY ANGIOGRAPHY N/A 02/04/2017  Procedure: CORONARY ANGIOGRAPHY (CATH LAB);  Surgeon: Troy Sine, MD;  Location: [place] CV LAB;  Service: Cardiovascular;  Laterality: N/A;  . CORONARY ANGIOPLASTY WITH STENT PLACEMENT  08/22/2014   PTCA/DES X mLAD with 2.5 x 16 mm Promus Premier DES by Dr Julianne Handler  . ESOPHAGOGASTRODUODENOSCOPY N/A 05/18/2018   Procedure: ESOPHAGOGASTRODUODENOSCOPY (EGD);  Surgeon: Daneil Dolin, MD;  [facility];  Service: Endoscopy;  Laterality: N/A;  . FOOT SURGERY Right   . LEFT HEART CATHETERIZATION WITH  CORONARY ANGIOGRAM N/A 08/22/2014   Procedure: LEFT HEART CATHETERIZATION WITH CORONARY ANGIOGRAM;  Surgeon: Burnell Blanks, MD;  Location: [facility] CATH LAB;  Service: Cardiovascular;  Laterality: N/A;  . PERCUTANEOUS CORONARY STENT INTERVENTION (PCI-S)  08/22/2014   Procedure: PERCUTANEOUS CORONARY STENT INTERVENTION (PCI-S);  Surgeon: Burnell Blanks, MD;   . SHOULDER SURGERY    . TONSILLECTOMY     FH  Family History  Problem Relation Age of Onset  . Diabetes Other   . Diabetes Brother   . Colon cancer Maternal Uncle   . Skin cancer Mother   . Diabetes Father   . Hypertension Sister   . Skin cancer Sister    SH  reports that he quit smoking about 12 years ago. His smoking use included cigarettes and cigars. He started smoking about 64 years ago. He has a 20.00 pack-year smoking history. He has never used smokeless tobacco. He reports previous alcohol use. He reports that he does not use drugs. Allergies  Allergies  Allergen Reactions  . Adhesive [Tape]     bandaids cause blisters after leaving on for 24 hours   . Sulfa Antibiotics Other (See Comments)    "was terrible" "eye went crazy"  . Latex Other (See Comments)    blisters   Home medications Prior to Admission medications   Medication Sig Start Date End Date Taking? Authorizing Provider  amLODipine (NORVASC) 5 MG tablet TAKE (1) TABLET BY MOUTH ONCE DAILY. Patient taking differently: Take 5 mg by mouth daily. TAKE (1) TABLET BY MOUTH ONCE DAILY. 10/05/19  Yes Kathyrn Drown, MD  Ascorbic Acid (VITAMIN C) 1000 MG tablet Take 500 mg by mouth in the morning and at bedtime.    Yes [provider]  aspirin EC 81 MG tablet Take 81 mg by mouth daily.   Yes [provider]  atorvastatin (LIPITOR) 80 MG tablet TAKE (1) TABLET BY MOUTH ONCE DAILY. Patient taking differently: Take 80 mg by mouth daily. TAKE (1) TABLET BY MOUTH ONCE DAILY. 05/01/19  Yes Luking, Elayne Snare, MD  carvedilol (COREG) 12.5 MG tablet  TAKE (1) TABLET BY MOUTH TWICE DAILY. Patient taking differently: Take 12.5 mg by mouth in the morning and at bedtime. TAKE (1) TABLET BY MOUTH TWICE DAILY. 02/13/19  Yes Herminio Commons, MD  cefdinir (OMNICEF) 300 MG capsule Take 1 capsule (300 mg total) by mouth 2 (two) times daily. 11/29/19  Yes Luking, Scott A, MD  COMBIGAN 0.2-0.5 % ophthalmic solution Place 1 drop into the left eye 2 (two) times daily.  09/06/15  Yes [provider]  Cyanocobalamin (B-12) 2500 MCG TABS Take 1 tablet by mouth 3 (three) times a week. Three times a week on Monday Wednesday and Friday    Yes [provider]  dutasteride (AVODART) 0.5 MG capsule TAKE ONE CAPSULE BY MOUTH ONCE DAILY. Patient taking differently: Take 0.5 mg by mouth daily.  08/14/19  Yes Luking, Elayne Snare, MD  Emollient (CERAVE) CREA Apply 1  application topically daily at 6 (six) AM.    Yes [provider]  erythromycin ophthalmic ointment Place 1 application into the left eye 3 (three) times daily as needed (infection).  07/17/19  Yes [provider]  famotidine (PEPCID) 20 MG tablet Take one tablet po daily Patient taking differently: Take 20 mg by mouth daily. Take one tablet po daily 11/02/19  Yes Luking, Scott A, MD  Flaxseed, Linseed, (FLAXSEED OIL PO) Take 1 capsule by mouth daily.    Yes [provider]  insulin lispro (HUMALOG KWIKPEN) 100 UNIT/ML KwikPen Inject twice daily per following: Breakfast 2 units; if 150-250 then 3 units; if greater than 250 5 units. Supper-3 units but if 150-250 do 4 units; if greater that 250 6 units Patient taking differently: Inject 2-4 Units into the skin See admin instructions. Inject 2 units at breakfast and 4 units at supper daily 11/24/19  Yes Luking, Scott A, MD  Lactobacillus (PROBIOTIC ACIDOPHILUS) CAPS Take 1 capsule by mouth daily at 6 (six) AM.   Yes [provider]  LANTUS SOLOSTAR 100 UNIT/ML Solostar Pen INJECT 22 UNITS SUBCUTANEOUSLY ONCE DAILY AT  10PM Patient taking differently: Inject 22 Units into the skin every evening.  11/14/19  Yes Luking, Scott A, MD  mometasone (ELOCON) 0.1 % cream APPLY TO AFFECTED AREAS ONCE DAILY. Patient taking differently: Apply 1 application topically daily as needed.  09/26/19  Yes Kathyrn Drown, MD  Multiple Vitamin (MULTIVITAMIN) tablet Take 1 tablet by mouth daily.   Yes [provider]  Polyvinyl Alcohol-Povidone (REFRESH OP) Place 1 drop into both ears 2 (two) times daily as needed (dryness).    Yes [provider]  psyllium (METAMUCIL) 58.6 % powder Take 1 packet by mouth daily.    Yes [provider]  sitaGLIPtin (JANUVIA) 100 MG tablet TAKE (1) TABLET BY MOUTH ONCE DAILY. Patient taking differently: Take 100 mg by mouth daily. TAKE (1) TABLET BY MOUTH ONCE DAILY. 05/01/19  Yes Kathyrn Drown, MD  tamsulosin (FLOMAX) 0.4 MG CAPS capsule TAKE (1) CAPSULE BY MOUTH ONCE DAILY. Patient taking differently: Take 0.4 mg by mouth daily.  08/14/19  Yes Kathyrn Drown, MD  torsemide (DEMADEX) 20 MG tablet Take one tablet qam prn edema Patient taking differently: Take 20 mg by mouth daily. 1/2 tablet every morning 05/01/19  Yes Luking, Scott A, MD  Turmeric 500 MG CAPS Take 500 mg by mouth daily.   Yes [provider]  glucose blood (CONTOUR NEXT TEST) test strip USE 1 STRIP TO CHECK GLUCOSE THREE TIMES DAILY. FOR ICD 10 E11.9 Patient not taking: Reported on 11/30/2019 04/04/19   Kathyrn Drown, MD  metFORMIN (GLUCOPHAGE) 500 MG tablet Take one tablet po two times daily Patient not taking: Reported on 11/30/2019 11/02/19   Kathyrn Drown, MD  nitroGLYCERIN (NITROSTAT) 0.4 MG SL tablet PLACE 1 TAB UNDER TONGUE EVERY 5 MIN IF NEEDED FOR CHEST PAIN. MAY USE 3 TIMES.NO RELIEF CALL 911. Patient taking differently: Place 0.4 mg under the tongue every 5 (five) minutes as needed. PLACE 1 TAB UNDER TONGUE EVERY 5 MIN IF NEEDED FOR CHEST PAIN. MAY USE 3 TIMES.NO RELIEF CALL 911. 01/31/19    Erma Heritage, PA-C    Current Medications Scheduled Meds: . aspirin EC  81 mg Oral Daily  . atorvastatin  80 mg Oral Daily  . brimonidine  1 drop Left Eye BID   And  . timolol  1 drop Left Eye BID  .  carvedilol  12.5 mg Oral BID WC  . cefdinir  300 mg Oral BID  . dutasteride  0.5 mg Oral Daily  . famotidine  20 mg Oral Daily  . heparin  5,000 Units Subcutaneous Q8H  . insulin aspart  0-5 Units Subcutaneous QHS  . insulin aspart  0-9 Units Subcutaneous TID WC  . insulin glargine  10 Units Subcutaneous QPM  . multivitamin with minerals  1 tablet Oral Daily  . sodium bicarbonate  650 mg Oral BID  . sodium chloride flush  3 mL Intravenous Q12H  . tamsulosin  0.4 mg Oral Daily  . vitamin B-12  2,000 mcg Oral Once per day on Mon Wed Fri   Continuous Infusions: . sodium chloride     PRN Meds:.sodium chloride, acetaminophen **OR** acetaminophen, ondansetron **OR** ondansetron (ZOFRAN) IV, sodium chloride flush  CBC Recent Labs  Lab 11/26/19 1250 11/29/19 1205 11/30/19 1043 12/01/19 0439  WBC 8.9 9.0 8.3 7.6  NEUTROABS 7.0 6.9  --   --   HGB 9.5* 9.8* 9.5* 9.3*  HCT 29.4* 29.0* 28.5* 28.5*  MCV 95.8 93 92.8 94.4  PLT 256 292 287 144   Basic Metabolic Panel Recent Labs  Lab 11/26/19 1250 11/29/19 1205 11/30/19 1043 12/01/19 0439  NA 130* 125* 123* 129*  K 4.8 6.3* 4.9 4.9  CL 98 92* 93* 94*  CO2 23 19* 20* 23  GLUCOSE 204* 156* 179* 115*  BUN 36* 44* 48* 49*  CREATININE 1.39* 1.96* 2.00* 2.06*  CALCIUM 8.5* 8.8 8.3* 8.7*    Physical Exam  Blood pressure (!) 106/91, pulse 70, temperature 97.9 F (36.6 C), temperature source Oral, resp. rate 19, height 5\' 11"  (1.803 m), weight 101.7 kg, SpO2 99 %. GEN: NAD, sitting in chair, appears in no distress ENT: NCAT EYES: EOMI CV: Regular, normal S1 and S2 PULM: Clear bilaterally ABD: Protuberant, soft SKIN: No rashes or lesions EXT: 1+ pitting edema in the distal legs NEURO: Nonfocal  Assessment 63M with  AKI and hyponatremia after recent diagnosis of community-acquired pneumonia and IV contrast exposure.  1. AKI on CKD 3, likely mixture of contrast exposure and #2; appears to be making more urine with 850 mL of urine most recently; I anticipate he will recover in time 2. CAP on cefdinir, improving 3. 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 4. Hypervolemia with abdominal bloating and lower extremity edema; not sure if his early satiety and bloating are most consistent with CHF, TTE is pending at the current time the last evaluation in early [id number] had diastolic dysfunction with normal LVEF and no significant right-sided pathology.  At this point his respiratory status is improving I think we should hold diuretics for least 24 hours and reassess tomorrow. 5. DM2, metformin held 6. Transient low serum bicarbonate, improved, I do not think we need to continue oral sodium bicarbonate at this time especially given his volume status  Plan 1. As above, continue supportive care, I anticipate he will recover in the next 1 to 2 days 2. Daily weights, Daily Renal Panel, Strict I/Os, Avoid nephrotoxins (NSAIDs, judicious IV Contrast)    Jerry Burns  [phone] pgr 12/01/2019, 12:38 PM

## 2019-12-01 NOTE — Progress Notes (Signed)
PROGRESS NOTE    Jerry Burns  QQI:[id number] DOB: 07/17/1934 DOA: 11/30/2019 PCP: Kathyrn Drown, MD   Brief Narrative:  Per HPI:  Jerry Burns is a 86 y.o. male with medical history significant for recent diagnosis of multifocal pneumonia with initiation of antibiotics, diastolic CHF, obesity, CAD, type 2 diabetes, dyslipidemia, hypertension, and CKD stage IIIa who was sent to the ED by his PCP given hyponatremia as well as some hyperkalemia and AKI.  He was diagnosed with multifocal pneumonia recently in the ED on 5/2 and has been taking Omnicef with improvement in some of his symptoms.  He states that his appetite has been generally poor and he is not eating very much overall, but is drinking plenty of water, up to 12 glasses of water a day.  He generally feels unwell, but is not confused.  He denies any abdominal pain, vomiting, or diarrhea.  No chest pain, shortness of breath, cough, fevers, or chills noted.  -Patient was admitted with hypovolemic hyponatremia with associated symptomatology and appears to be improving today.  He also has AKI on CKD stage IIIa.  This appears to be related to recent contrast exposure per nephrology.  Plans are to hold further diuresis and continue fluid restriction and recheck a.m. labs.  His AKI should improve on its own given time.  Anticipate discharge in next 1-2 days if further improved.  Assessment & Plan:   Active Problems:   Hyponatremia   Symptomatic hyponatremia-hypervolemic, improving -Appears to be multifactorial with increased free water intake as well as SIADH. -Plan to fluid restrict for now and hold diuretics -Continue to check a.m. labs -TSH is 4.1  AKI on CKD stage IIIa -Baseline creatinine 1.3-1.4 with current creatinine of 2 -Appears to be related to contrast-induced nephropathy with recent contrast exposure on 5/2 -Renal ultrasound with no acute findings -Anticipate improvement over time, avoid diuretics -No further  need for sodium bicarbonate and this has been discontinued  Recent diagnosis of multifocal pneumonia -Covid testing today is noted to be negative -He was recently started on Omnicef for treatment of this pneumonia which I will continue for 1 more day for total of 5-day course of treatment  Type 2 diabetes/obesity -Hold Metformin and place on SSI -Hold Januvia -Carb modified diet  CAD -Continue on aspirin, statin, and Coreg  Hypertension-stable -Hold amlodipine for now  History of diastolic CHF -LVEF 94-17% on 2D echocardiogram 08/2018 -No signs of overt congestive heart failure currently noted, but he is volume overloaded and is complaining of dyspnea and may have some mild symptoms -Hold further torsemide due to AKI  BPH -Continue Flomax and Avodart  GERD -Continue famotidine   DVT prophylaxis: Heparin Code Status: Full Family Communication: None at bedside Disposition Plan:Admit for treatment of hyponatremia and management of AKI Consults called: Nephrology Admission status: Inpatient, MedSurg Status is: Inpatient  Remains inpatient appropriate because:Persistent severe electrolyte disturbances   Dispo: The patient is from: Home  Anticipated d/c is to: Home  Anticipated d/c date is: 1 days  Patient currently is not medically stable to d/c.  He will require some fluid restriction and diuresis for management of his hyponatremia as well as further evaluation of his AKI while inpatient.   Subjective: Patient seen and evaluated today with no new acute complaints or concerns. No acute concerns or events noted overnight.  He states that he is overall feeling better today.  Objective: Vitals:   11/30/19 2109 12/01/19 0459 12/01/19 0651 12/01/19 1334  BP: 90/60 Marland Kitchen)  106/91  106/65  Pulse: 72 70  62  Resp: 18 19  17   Temp: 97.9 F (36.6 C) 97.9 F (36.6 C)  97.9 F (36.6 C)  TempSrc: Oral Oral    SpO2: 97% 99%  98%   Weight:   101.7 kg   Height:        Intake/Output Summary (Last 24 hours) at 12/01/2019 1335 Last data filed at 12/01/2019 0900 Gross per 24 hour  Intake 240 ml  Output 850 ml  Net -610 ml   Filed Weights   11/30/19 1020 12/01/19 0651  Weight: 99.8 kg 101.7 kg    Examination:  General exam: Appears calm and comfortable  Respiratory system: Clear to auscultation. Respiratory effort normal. Cardiovascular system: S1 & S2 heard, RRR. No JVD, murmurs, rubs, gallops or clicks. No pedal edema. Gastrointestinal system: Abdomen is minimally distended, soft and nontender. No organomegaly or masses felt. Normal bowel sounds heard. Central nervous system: Alert and oriented. No focal neurological deficits. Extremities: 1+ pitting edema noted bilaterally to mid shins Skin: No rashes, lesions or ulcers Psychiatry: Judgement and insight appear normal. Mood & affect appropriate.     Data Reviewed: I have personally reviewed following labs and imaging studies  CBC: Recent Labs  Lab 11/26/19 1250 11/29/19 1205 11/30/19 1043 12/01/19 0439  WBC 8.9 9.0 8.3 7.6  NEUTROABS 7.0 6.9  --   --   HGB 9.5* 9.8* 9.5* 9.3*  HCT 29.4* 29.0* 28.5* 28.5*  MCV 95.8 93 92.8 94.4  PLT 256 292 287 267   Basic Metabolic Panel: Recent Labs  Lab 11/26/19 1250 11/29/19 1205 11/30/19 1043 12/01/19 0439  NA 130* 125* 123* 129*  K 4.8 6.3* 4.9 4.9  CL 98 92* 93* 94*  CO2 23 19* 20* 23  GLUCOSE 204* 156* 179* 115*  BUN 36* 44* 48* 49*  CREATININE 1.39* 1.96* 2.00* 2.06*  CALCIUM 8.5* 8.8 8.3* 8.7*  MG  --   --   --  2.4   GFR: Estimated Creatinine Clearance: 32.4 mL/min (A) (by C-G formula based on SCr of 2.06 mg/dL (H)). Liver Function Tests: Recent Labs  Lab 11/26/19 1251 11/29/19 1205 11/30/19 1043 12/01/19 0439  AST 30 42* 79* 74*  ALT 36 53* 87* 100*  ALKPHOS 87 122* 121 120  BILITOT 0.6 0.5 0.7 0.7  PROT 6.9 6.4 6.7 6.7  ALBUMIN 3.8 3.8 3.4* 3.4*   Recent Labs  Lab  11/26/19 1251  LIPASE 23   No results for input(s): AMMONIA in the last 168 hours. Coagulation Profile: No results for input(s): INR, PROTIME in the last 168 hours. Cardiac Enzymes: No results for input(s): CKTOTAL, CKMB, CKMBINDEX, TROPONINI in the last 168 hours. BNP (last 3 results) No results for input(s): PROBNP in the last 8760 hours. HbA1C: No results for input(s): HGBA1C in the last 72 hours. CBG: Recent Labs  Lab 11/30/19 1700 11/30/19 2109 12/01/19 0727 12/01/19 1139  GLUCAP 177* 176* 100* 132*   Lipid Profile: No results for input(s): CHOL, HDL, LDLCALC, TRIG, CHOLHDL, LDLDIRECT in the last 72 hours. Thyroid Function Tests: Recent Labs    11/30/19 1043  TSH 4.149   Anemia Panel: Recent Labs    11/29/19 1205  VITAMINB12 >2000*  FERRITIN 1,111*   Sepsis Labs: No results for input(s): PROCALCITON, LATICACIDVEN in the last 168 hours.  Recent Results (from the past 240 hour(s))  Respiratory Panel by RT PCR (Flu A&B, Covid) - Nasopharyngeal Swab     Status: None   Collection  Time: 11/30/19 11:07 AM   Specimen: Nasopharyngeal Swab  Result Value Ref Range Status   SARS Coronavirus 2 by RT PCR NEGATIVE NEGATIVE Final    Comment: (NOTE) SARS-CoV-2 target nucleic acids are NOT DETECTED. The SARS-CoV-2 RNA is generally detectable in upper respiratoy specimens during the acute phase of infection. The lowest concentration of SARS-CoV-2 viral copies this assay can detect is 131 copies/mL. A negative result does not preclude SARS-Cov-2 infection and should not be used as the sole basis for treatment or other patient management decisions. A negative result may occur with  improper specimen collection/handling, submission of specimen other than nasopharyngeal swab, presence of viral mutation(s) within the areas targeted by this assay, and inadequate number of viral copies (<131 copies/mL). A negative result must be combined with clinical observations, patient  history, and epidemiological information. The expected result is Negative. Fact Sheet for Patients:  PinkCheek.be Fact Sheet for Healthcare Providers:  GravelBags.it This test is not yet ap proved or cleared by the [place] FDA and  has been authorized for detection and/or diagnosis of SARS-CoV-2 by FDA under an Emergency Use Authorization (EUA). This EUA will remain  in effect (meaning this test can be used) for the duration of the COVID-19 declaration under Section 564(b)(1) of the Act, 21 U.S.C. section 360bbb-3(b)(1), unless the authorization is terminated or revoked sooner.    Influenza A by PCR NEGATIVE NEGATIVE Final   Influenza B by PCR NEGATIVE NEGATIVE Final    Comment: (NOTE) The Xpert Xpress SARS-CoV-2/FLU/RSV assay is intended as an aid in  the diagnosis of influenza from Nasopharyngeal swab specimens and  should not be used as a sole basis for treatment. Nasal washings and  aspirates are unacceptable for Xpert Xpress SARS-CoV-2/FLU/RSV  testing. Fact Sheet for Patients: PinkCheek.be Fact Sheet for Healthcare Providers: GravelBags.it This test is not yet approved or cleared by the [place] FDA and  has been authorized for detection and/or diagnosis of SARS-CoV-2 by  FDA under an Emergency Use Authorization (EUA). This EUA will remain  in effect (meaning this test can be used) for the duration of the  Covid-19 declaration under Section 564(b)(1) of the Act, 21  U.S.C. section 360bbb-3(b)(1), unless the authorization is  terminated or revoked. Performed at [facility], [address], [place], [place] [address]          Radiology Studies: US Abdomen Complete  Result Date: 11/30/2019 CLINICAL DATA:  Abdominal distension for 5 days EXAM: ABDOMEN ULTRASOUND COMPLETE COMPARISON:  11/26/2019 FINDINGS: Gallbladder: Gallbladder is decompressed,  with nonspecific gallbladder wall thickening measuring 6 mm. No evidence of cholelithiasis. Common bile duct: Diameter: 2 mm. Liver: No focal lesion identified. Within normal limits in parenchymal echogenicity. Portal vein is patent on color Doppler imaging with normal direction of blood flow towards the liver. IVC: No abnormality visualized. Pancreas: Visualized portion unremarkable. Spleen: Size and appearance within normal limits. Right Kidney: Length: 10.7 cm. Echogenicity within normal limits. No mass or hydronephrosis visualized. Left Kidney: Length: 11.5 cm. Echogenicity within normal limits. No mass or hydronephrosis visualized. Abdominal aorta: No aneurysm visualized. Other findings: Small volume ascites greatest in the left upper quadrant. IMPRESSION: 1. Nonspecific gallbladder wall thickening given decompressed gallbladder. No evidence of cholelithiasis. 2. Small volume ascites. 3. Otherwise unremarkable exam. Electronically Signed   By: Randa Ngo M.D.   On: 11/30/2019 15:22   DG Chest Port 1 View  Result Date: 11/30/2019 CLINICAL DATA:  Shortness of breath. Weakness. Pneumonia. EXAM: PORTABLE CHEST 1 VIEW COMPARISON:  11/26/2019 FINDINGS: The heart size and mediastinal contours are within normal limits. Aortic atherosclerosis incidentally noted. Bilateral calcified pleural plaque again seen, consistent with asbestos related pleural disease. Stable mild left basilar pleural-parenchymal scarring. No evidence of acute pulmonary infiltrate or edema. IMPRESSION: Stable left basilar pleural-parenchymal scarring scarring and asbestos related pleural disease. No acute findings. Electronically Signed   By: Marlaine Hind M.D.   On: 11/30/2019 11:00        Scheduled Meds: . aspirin EC  81 mg Oral Daily  . atorvastatin  80 mg Oral Daily  . brimonidine  1 drop Left Eye BID   And  . timolol  1 drop Left Eye BID  . carvedilol  12.5 mg Oral BID WC  . cefdinir  300 mg Oral BID  . dutasteride  0.5 mg  Oral Daily  . famotidine  20 mg Oral Daily  . heparin  5,000 Units Subcutaneous Q8H  . insulin aspart  0-5 Units Subcutaneous QHS  . insulin aspart  0-9 Units Subcutaneous TID WC  . insulin glargine  10 Units Subcutaneous QPM  . multivitamin with minerals  1 tablet Oral Daily  . sodium chloride flush  3 mL Intravenous Q12H  . tamsulosin  0.4 mg Oral Daily  . vitamin B-12  2,000 mcg Oral Once per day on Mon Wed Fri   Continuous Infusions: . sodium chloride       LOS: 1 day    Time spent: 30 minutes    Shaira Sova Darleen Crocker, DO [facility]  If 7PM-7AM, please contact night-coverage [email or web address] 12/01/2019, 1:35 PM

## 2019-12-02 LAB — BASIC METABOLIC PANEL
Anion gap: 11 (ref 5–15)
BUN: 46 mg/dL — ABNORMAL HIGH (ref 8–23)
CO2: 23 mmol/L (ref 22–32)
Calcium: 8.6 mg/dL — ABNORMAL LOW (ref 8.9–10.3)
Chloride: 95 mmol/L — ABNORMAL LOW (ref 98–111)
Creatinine, Ser: 1.76 mg/dL — ABNORMAL HIGH (ref 0.61–1.24)
GFR calc Af Amer: 40 mL/min — ABNORMAL LOW (ref >60)
GFR calc non Af Amer: 35 mL/min — ABNORMAL LOW (ref >60)
Glucose, Bld: 77 mg/dL (ref 70–99)
Potassium: 3.9 mmol/L (ref 3.5–5.1)
Sodium: 129 mmol/L — ABNORMAL LOW (ref 135–145)

## 2019-12-02 LAB — GLUCOSE, CAPILLARY
Glucose-Capillary: 66 mg/dL — ABNORMAL LOW (ref 70–99)
Glucose-Capillary: 76 mg/dL (ref 70–99)

## 2019-12-02 NOTE — Discharge Summary (Signed)
Physician Discharge Summary  Jerry Burns TDS:[id number] DOB: [birth year] DOA: 11/30/2019  PCP: Kathyrn Drown, MD  Admit date: 11/30/2019  Discharge date: 12/02/2019  Admitted From:Home  Disposition:  Home  Recommendations for Outpatient Follow-up:  1. Follow up with PCP in 1-2 weeks 2. Please obtain BMP in 5-7 days 3. Resume torsemide on 12/03/2019 4. Continue fluid restriction of 1.5 L daily until further follow-up 5. Hold home Metformin until further improvement in labs noted 6. Antibiotic course has been completed with no further need at this point.  Home Health: None  Equipment/Devices: None  Discharge Condition: Stable  CODE STATUS: Full  Diet recommendation: Heart Healthy/carb modified with 1.5 L fluid restriction  Brief/Interim Summary: Per HPI: Jerry Burns a 86 y.o.malewith medical history significant forrecent diagnosis of multifocal pneumonia with initiation of antibiotics, diastolic CHF, obesity, CAD, type 2 diabetes, dyslipidemia, hypertension, and CKD stage IIIa who was sent to the ED by his PCP given hyponatremia as well as some hyperkalemia and AKI. He was diagnosed with multifocal pneumonia recently in the ED on 5/2 and has been taking Omnicef with improvement in some of his symptoms. He states that his appetite has been generally poor and he is not eating very much overall, but is drinking plenty of water,up to 12 glasses of water a day. He generally feels unwell, but is not confused. He denies any abdominal pain, vomiting, or diarrhea. No chest pain, shortness of breath, cough, fevers, or chills noted.  -Patient was admitted with hypervolemic hyponatremia along with AKI on CKD stage IIIa that appears to be related to contrast-induced nephropathy with recent CT contrast study in the presence of Metformin use was performed on 11/26/2019.  His kidney function is improving with creatinine of 1.7 today and is noted to be around 1.3 at baseline.  He has  been advised to resume his home torsemide as he is still hypervolemic starting on 5/9 and this was confirmed with nephrology.  He will need to remain on fluid restriction of 1.5 L daily.  Sodium levels have improved to 129 from 123 on admission.  He will need to have further repeat lab work in the next 1 week to ensure further improvement to his creatinine levels as well as sodium levels and will need medication adjustment per his PCP at that point.  He is otherwise stable for discharge today.  Discharge Diagnoses:  Active Problems:   Hyponatremia  Principal discharge diagnosis: Symptomatic hypervolemic hyponatremia along with AKI on CKD stage IIIa related to contrast-induced nephropathy.  Discharge Instructions  Discharge Instructions    Diet - low sodium heart healthy   Complete by: As directed    Increase activity slowly   Complete by: As directed      Allergies as of 12/02/2019      Reactions   Adhesive [tape]    bandaids cause blisters after leaving on for 24 hours    Sulfa Antibiotics Other (See Comments)   "was terrible" "eye went crazy"   Latex Other (See Comments)   blisters      Medication List    STOP taking these medications   cefdinir 300 MG capsule Commonly known as: OMNICEF   metFORMIN 500 MG tablet Commonly known as: GLUCOPHAGE     TAKE these medications   amLODipine 5 MG tablet Commonly known as: NORVASC TAKE (1) TABLET BY MOUTH ONCE DAILY. What changed:   how much to take  how to take this  when to take this  aspirin EC 81 MG tablet Take 81 mg by mouth daily.   atorvastatin 80 MG tablet Commonly known as: LIPITOR TAKE (1) TABLET BY MOUTH ONCE DAILY. What changed:   how much to take  how to take this  when to take this   B-12 2500 MCG Tabs Take 1 tablet by mouth 3 (three) times a week. Three times a week on Monday Wednesday and Friday   carvedilol 12.5 MG tablet Commonly known as: COREG TAKE (1) TABLET BY MOUTH TWICE DAILY. What  changed: See the new instructions.   CeraVe Crea Apply 1 application topically daily at 6 (six) AM.   Combigan 0.2-0.5 % ophthalmic solution Generic drug: brimonidine-timolol Place 1 drop into the left eye 2 (two) times daily.   Contour Next Test test strip Generic drug: glucose blood USE 1 STRIP TO CHECK GLUCOSE THREE TIMES DAILY. FOR ICD 10 E11.9   dutasteride 0.5 MG capsule Commonly known as: AVODART TAKE ONE CAPSULE BY MOUTH ONCE DAILY.   erythromycin ophthalmic ointment Place 1 application into the left eye 3 (three) times daily as needed (infection).   famotidine 20 MG tablet Commonly known as: Pepcid Take one tablet po daily What changed:   how much to take  how to take this  when to take this   FLAXSEED OIL PO Take 1 capsule by mouth daily.   insulin lispro 100 UNIT/ML KwikPen Commonly known as: HumaLOG KwikPen Inject twice daily per following: Breakfast 2 units; if 150-250 then 3 units; if greater than 250 5 units. Supper-3 units but if 150-250 do 4 units; if greater that 250 6 units What changed:   how much to take  how to take this  when to take this  additional instructions   Lantus SoloStar 100 UNIT/ML Solostar Pen Generic drug: insulin glargine INJECT 22 UNITS SUBCUTANEOUSLY ONCE DAILY AT 10PM What changed: See the new instructions.   mometasone 0.1 % cream Commonly known as: ELOCON APPLY TO AFFECTED AREAS ONCE DAILY. What changed: See the new instructions.   multivitamin tablet Take 1 tablet by mouth daily.   nitroGLYCERIN 0.4 MG SL tablet Commonly known as: NITROSTAT PLACE 1 TAB UNDER TONGUE EVERY 5 MIN IF NEEDED FOR CHEST PAIN. MAY USE 3 TIMES.NO RELIEF CALL 911. What changed: See the new instructions.   Probiotic Acidophilus Caps Take 1 capsule by mouth daily at 6 (six) AM.   psyllium 58.6 % powder Commonly known as: METAMUCIL Take 1 packet by mouth daily.   REFRESH OP Place 1 drop into both ears 2 (two) times daily as needed  (dryness).   sitaGLIPtin 100 MG tablet Commonly known as: Januvia TAKE (1) TABLET BY MOUTH ONCE DAILY. What changed:   how much to take  how to take this  when to take this   tamsulosin 0.4 MG Caps capsule Commonly known as: FLOMAX TAKE (1) CAPSULE BY MOUTH ONCE DAILY. What changed: See the new instructions.   torsemide 20 MG tablet Commonly known as: DEMADEX Take one tablet qam prn edema What changed:   how much to take  how to take this  when to take this  additional instructions   Turmeric 500 MG Caps Take 500 mg by mouth daily.   vitamin C 1000 MG tablet Take 500 mg by mouth in the morning and at bedtime.      Follow-up Information    Luking, Elayne Snare, MD Follow up in 5 day(s).   Specialty: Family Medicine Contact information: [address]  [place] [address] [phone]          Allergies  Allergen Reactions  . Adhesive [Tape]     bandaids cause blisters after leaving on for 24 hours   . Sulfa Antibiotics Other (See Comments)    "was terrible" "eye went crazy"  . Latex Other (See Comments)    blisters    Consultations:  Nephrology   Procedures/Studies: CT Angio Chest PE W/Cm &/Or Wo Cm  Result Date: 11/26/2019 CLINICAL DATA:  Shortness of breath 1 week with fatigue and abdominal distension. Hypoxia. EXAM: CT ANGIOGRAPHY CHEST WITH CONTRAST TECHNIQUE: Multidetector CT imaging of the chest was performed using the standard protocol during bolus administration of intravenous contrast. Multiplanar CT image reconstructions and MIPs were obtained to evaluate the vascular anatomy. CONTRAST:  186mL OMNIPAQUE IOHEXOL 350 MG/ML SOLN COMPARISON:  02/12/2019 FINDINGS: Cardiovascular: Stable borderline cardiomegaly. There is a small pericardial effusion which is new. Calcified plaque over the left main and 3 vessel coronary arteries. Thoracic aorta is normal caliber without aneurysm or dissection. Pulmonary arterial system is well opacified  without evidence of emboli. Remaining vascular structures are unremarkable. Mediastinum/Nodes: No mediastinal or hilar adenopathy. Remaining mediastinal structures are unremarkable. Lungs/Pleura: Lungs are adequately inflated. There multiple bilateral calcified pleural plaques. There is mild patchy airspace opacification over the right middle lobe and lower lobes suggesting infection. Tiny amount right pleural fluid with mild right posterior basilar atelectasis. Airways are normal. 2-3 mm nodule over the right middle lobe. Upper Abdomen: Nonspecific 1.4 cm lymph node over the gastrohepatic ligament unchanged. Calcified plaque over the abdominal aorta. No acute findings. Musculoskeletal: Degenerative change of the spine. Review of the MIP images confirms the above findings. IMPRESSION: 1.  No evidence of pulmonary embolism. 2. Mild patchy airspace process over the lower lobes and right middle lobe suggesting multifocal infection. Tiny amount right pleural fluid with basilar atelectasis. 3. Calcified pleural plaques bilaterally compatible with asbestos related pleural disease. 4. 2-3 mm right middle lobe nodule. Recommend follow-up CT 1 year. This recommendation follows the consensus statement: Guidelines for Management of Incidental Pulmonary Nodules Detected on CT Images: From the Fleischner Society 2017; Radiology 2017; 284:228-243. 5.  Small pericardial effusion which is new. 6. Aortic Atherosclerosis (ICD10-I70.0). Atherosclerotic coronary artery disease. 7.  Stable nonspecific 1.4 cm gastrohepatic ligament lymph node. Electronically Signed   By: Marin Olp M.D.   On: 11/26/2019 16:10   US Abdomen Complete  Result Date: 11/30/2019 CLINICAL DATA:  Abdominal distension for 5 days EXAM: ABDOMEN ULTRASOUND COMPLETE COMPARISON:  11/26/2019 FINDINGS: Gallbladder: Gallbladder is decompressed, with nonspecific gallbladder wall thickening measuring 6 mm. No evidence of cholelithiasis. Common bile duct: Diameter: 2  mm. Liver: No focal lesion identified. Within normal limits in parenchymal echogenicity. Portal vein is patent on color Doppler imaging with normal direction of blood flow towards the liver. IVC: No abnormality visualized. Pancreas: Visualized portion unremarkable. Spleen: Size and appearance within normal limits. Right Kidney: Length: 10.7 cm. Echogenicity within normal limits. No mass or hydronephrosis visualized. Left Kidney: Length: 11.5 cm. Echogenicity within normal limits. No mass or hydronephrosis visualized. Abdominal aorta: No aneurysm visualized. Other findings: Small volume ascites greatest in the left upper quadrant. IMPRESSION: 1. Nonspecific gallbladder wall thickening given decompressed gallbladder. No evidence of cholelithiasis. 2. Small volume ascites. 3. Otherwise unremarkable exam. Electronically Signed   By: Randa Ngo M.D.   On: 11/30/2019 15:22   CT Abdomen Pelvis W Contrast  Result Date: 11/26/2019 CLINICAL DATA:  Abdominal distension. Shortness of breath. EXAM:  CT ABDOMEN AND PELVIS WITH CONTRAST TECHNIQUE: Multidetector CT imaging of the abdomen and pelvis was performed using the standard protocol following bolus administration of intravenous contrast. CONTRAST:  156mL OMNIPAQUE IOHEXOL 350 MG/ML SOLN COMPARISON:  Report from CT 02/12/2019, images not available. CT 08/03/2017 reviewed. Examination was performed in conjunction with CTA of the chest, reported separately. FINDINGS: Lower chest: Assessed fully on associated chest CT. Trace bilateral pleural effusions. Patchy bibasilar opacities. There is a small pericardial effusion. Perihepatic calcifications abutting the dome of the liver appear to represent diaphragmatic calcifications rather than focal liver lesion. Hepatobiliary: Upper normal size liver spanning 20 cm cranial caudal. Calcifications about the right dome unchanged from prior exam, felt to be diaphragmatic rather than calcified hepatic lesion. This is unchanged from  prior exam. Gallbladder is decompressed. No calcified gallstone. Equivocal gallbladder wall thickening versus nondistention. Mild edema in the right upper quadrant, bat does not appear centered on the gallbladder. Pancreas: Fatty atrophy. No ductal dilatation. Right upper quadrant edema does not appear centered on the pancreas. There is no evidence pancreatic mass. Spleen: Normal in size without focal abnormality. Adrenals/Urinary Tract: No adrenal nodule. No hydronephrosis. There is symmetric bilateral perinephric edema. Symmetric excretion from both kidneys on delayed phase imaging. Scattered low-density lesions in the kidneys are too small to characterize but likely cysts. Mild wall thickening of the urinary bladder. Mild perivesicular fat stranding Stomach/Bowel: Small hiatal hernia. Stomach is partially distended. There is wall thickening about the second, third, and fourth portion of the duodenum with adjacent fat stranding, for example series 2, image 37. Fat stranding tracks into the right upper quadrant in the porta hepatis. No free air. No small bowel dilatation or obstruction. No other small bowel inflammation. Cecum slightly high-riding in the right mid abdomen. Prior appendectomy per history. Moderate stool in the ascending and transverse colon. Small volume of stool distally. Diverticulosis from the descending colon through the sigmoid. No discrete diverticulitis. No colonic wall thickening. Vascular/Lymphatic: Advanced aortic and branch atherosclerosis. No aortic aneurysm. The portal vein is patent. Mesenteric vessels appear patent. There is an enlarged upper abdominal lymph node in the right upper quadrant adjacent to the right diaphragmatic crus and IVC that measures 19 mm, this is unchanged from 2019 and presumably reactive. No other enlarged lymph nodes in the abdomen or pelvis. Reproductive: Prostate is unremarkable. Other: Generalized fat stranding in the right upper quadrant appears to be  centered on the duodenum. Edema tracks into the central mesentery and right upper quadrant. Small amount of upper abdominal ascites, with greater ascites tracking in the pericolic gutters and into the pelvis. Bilateral inguinal hernias which contain ascites on the left and only fat on the right. No free air or focal abscess. Musculoskeletal: There are no acute or suspicious osseous abnormalities. IMPRESSION: 1. Generalized fat stranding in the right upper quadrant appears centered on the duodenum with mild duodenal wall thickening, suspicious for duodenitis. No free air or focal abscess. 2. Nondistended gallbladder with suggestion of mild wall thickening and pericholecystic edema. This may be reactive related to duodenal inflammation, however is nonspecific. 3. Mild wall thickening of the urinary bladder with perivesicular fat stranding, can be seen with urinary tract infection. 4. Small amount of abdominopelvic ascites. 5. Colonic diverticulosis without diverticulitis, prominent in the sigmoid colon. 6. An enlarged upper abdominal lymph nodes adjacent to the right diaphragmatic crus and IVC is in stable from 2019, presumably benign. Aortic Atherosclerosis (ICD10-I70.0). Electronically Signed   By: Keith Rake M.D.   On:  11/26/2019 16:12   DG Chest Port 1 View  Result Date: 11/30/2019 CLINICAL DATA:  Shortness of breath. Weakness. Pneumonia. EXAM: PORTABLE CHEST 1 VIEW COMPARISON:  11/26/2019 FINDINGS: The heart size and mediastinal contours are within normal limits. Aortic atherosclerosis incidentally noted. Bilateral calcified pleural plaque again seen, consistent with asbestos related pleural disease. Stable mild left basilar pleural-parenchymal scarring. No evidence of acute pulmonary infiltrate or edema. IMPRESSION: Stable left basilar pleural-parenchymal scarring scarring and asbestos related pleural disease. No acute findings. Electronically Signed   By: Marlaine Hind M.D.   On: 11/30/2019 11:00   DG  Chest Portable 1 View  Result Date: 11/26/2019 CLINICAL DATA:  Shortness of breath since yesterday. EXAM: PORTABLE CHEST 1 VIEW COMPARISON:  02/12/2019 and chest CT 02/11/2019 FINDINGS: Lordotic technique is demonstrated. Lungs are adequately inflated without focal lobar consolidation or effusion. Evidence of calcified pleural plaques bilaterally. Mild stable cardiomegaly. Remainder of the exam is unchanged. IMPRESSION: 1.  No acute cardiopulmonary disease. 2.  Mild stable cardiomegaly. 3.  Evidence of asbestos related pleural disease. Electronically Signed   By: Marin Olp M.D.   On: 11/26/2019 13:41     Discharge Exam: Vitals:   12/01/19 2159 12/02/19 0527  BP: 118/69 126/77  Pulse: 72 69  Resp: 16 16  Temp: (!) 97.5 F (36.4 C) 98 F (36.7 C)  SpO2: 100% 99%   Vitals:   12/01/19 1334 12/01/19 2159 12/02/19 0524 12/02/19 0527  BP: 106/65 118/69  126/77  Pulse: 62 72  69  Resp: 17 16  16   Temp: 97.9 F (36.6 C) (!) 97.5 F (36.4 C)  98 F (36.7 C)  TempSrc:  Oral  Oral  SpO2: 98% 100%  99%  Weight:   102.8 kg   Height:        General: Pt is alert, awake, not in acute distress Cardiovascular: RRR, S1/S2 +, no rubs, no gallops Respiratory: CTA bilaterally, no wheezing, no rhonchi Abdominal: Soft, NT, ND, bowel sounds + Extremities: 1+ pitting edema noted to bilateral shins    The results of significant diagnostics from this hospitalization (including imaging, microbiology, ancillary and laboratory) are listed below for reference.     Microbiology: Recent Results (from the past 240 hour(s))  Respiratory Panel by RT PCR (Flu A&B, Covid) - Nasopharyngeal Swab     Status: None   Collection Time: 11/30/19 11:07 AM   Specimen: Nasopharyngeal Swab  Result Value Ref Range Status   SARS Coronavirus 2 by RT PCR NEGATIVE NEGATIVE Final    Comment: (NOTE) SARS-CoV-2 target nucleic acids are NOT DETECTED. The SARS-CoV-2 RNA is generally detectable in upper  respiratoy specimens during the acute phase of infection. The lowest concentration of SARS-CoV-2 viral copies this assay can detect is 131 copies/mL. A negative result does not preclude SARS-Cov-2 infection and should not be used as the sole basis for treatment or other patient management decisions. A negative result may occur with  improper specimen collection/handling, submission of specimen other than nasopharyngeal swab, presence of viral mutation(s) within the areas targeted by this assay, and inadequate number of viral copies (<131 copies/mL). A negative result must be combined with clinical observations, patient history, and epidemiological information. The expected result is Negative. Fact Sheet for Patients:  PinkCheek.be Fact Sheet for Healthcare Providers:  GravelBags.it This test is not yet ap proved or cleared by the [place] FDA and  has been authorized for detection and/or diagnosis of SARS-CoV-2 by FDA under an Emergency Use Authorization (EUA). This EUA  will remain  in effect (meaning this test can be used) for the duration of the COVID-19 declaration under Section 564(b)(1) of the Act, 21 U.S.C. section 360bbb-3(b)(1), unless the authorization is terminated or revoked sooner.    Influenza A by PCR NEGATIVE NEGATIVE Final   Influenza B by PCR NEGATIVE NEGATIVE Final    Comment: (NOTE) The Xpert Xpress SARS-CoV-2/FLU/RSV assay is intended as an aid in  the diagnosis of influenza from Nasopharyngeal swab specimens and  should not be used as a sole basis for treatment. Nasal washings and  aspirates are unacceptable for Xpert Xpress SARS-CoV-2/FLU/RSV  testing. Fact Sheet for Patients: PinkCheek.be Fact Sheet for Healthcare Providers: GravelBags.it This test is not yet approved or cleared by the [place] FDA and  has been authorized for  detection and/or diagnosis of SARS-CoV-2 by  FDA under an Emergency Use Authorization (EUA). This EUA will remain  in effect (meaning this test can be used) for the duration of the  Covid-19 declaration under Section 564(b)(1) of the Act, 21  U.S.C. section 360bbb-3(b)(1), unless the authorization is  terminated or revoked. Performed at [facility], [address], [place], [place] [address]      Labs: BNP (last 3 results) Recent Labs    11/26/19 1250 11/30/19 1043  BNP 112.0* 914.7*   Basic Metabolic Panel: Recent Labs  Lab 11/26/19 1250 11/29/19 1205 11/30/19 1043 12/01/19 0439 12/02/19 0713  NA 130* 125* 123* 129* 129*  K 4.8 6.3* 4.9 4.9 3.9  CL 98 92* 93* 94* 95*  CO2 23 19* 20* 23 23  GLUCOSE 204* 156* 179* 115* 77  BUN 36* 44* 48* 49* 46*  CREATININE 1.39* 1.96* 2.00* 2.06* 1.76*  CALCIUM 8.5* 8.8 8.3* 8.7* 8.6*  MG  --   --   --  2.4  --    Liver Function Tests: Recent Labs  Lab 11/26/19 1251 11/29/19 1205 11/30/19 1043 12/01/19 0439  AST 30 42* 79* 74*  ALT 36 53* 87* 100*  ALKPHOS 87 122* 121 120  BILITOT 0.6 0.5 0.7 0.7  PROT 6.9 6.4 6.7 6.7  ALBUMIN 3.8 3.8 3.4* 3.4*   Recent Labs  Lab 11/26/19 1251  LIPASE 23   No results for input(s): AMMONIA in the last 168 hours. CBC: Recent Labs  Lab 11/26/19 1250 11/29/19 1205 11/30/19 1043 12/01/19 0439  WBC 8.9 9.0 8.3 7.6  NEUTROABS 7.0 6.9  --   --   HGB 9.5* 9.8* 9.5* 9.3*  HCT 29.4* 29.0* 28.5* 28.5*  MCV 95.8 93 92.8 94.4  PLT 256 292 287 284   Cardiac Enzymes: No results for input(s): CKTOTAL, CKMB, CKMBINDEX, TROPONINI in the last 168 hours. BNP: Invalid input(s): POCBNP CBG: Recent Labs  Lab 12/01/19 1139 12/01/19 1612 12/01/19 2200 12/02/19 0726 12/02/19 0752  GLUCAP 132* 133* 146* 66* 76   D-Dimer No results for input(s): DDIMER in the last 72 hours. Hgb A1c No results for input(s): HGBA1C in the last 72 hours. Lipid Profile No results for input(s): CHOL, HDL,  LDLCALC, TRIG, CHOLHDL, LDLDIRECT in the last 72 hours. Thyroid function studies Recent Labs    11/30/19 1043  TSH 4.149   Anemia work up Recent Labs    11/29/19 1205  VITAMINB12 >2000*  FERRITIN 1,111*   Urinalysis    Component Value Date/Time   COLORURINE YELLOW 11/30/2019 1221   APPEARANCEUR HAZY (A) 11/30/2019 1221   LABSPEC 1.012 11/30/2019 1221   PHURINE 5.0 11/30/2019 [place] 11/30/2019 1221  HGBUR NEGATIVE 11/30/2019 1221   HGBUR trace-intact 01/12/2007 [place] 11/30/2019 1221   KETONESUR NEGATIVE 11/30/2019 1221   PROTEINUR NEGATIVE 11/30/2019 1221   UROBILINOGEN 0.2 01/12/2007 1307   NITRITE NEGATIVE 11/30/2019 [place] 11/30/2019 1221   Sepsis Labs Invalid input(s): PROCALCITONIN,  WBC,  LACTICIDVEN Microbiology Recent Results (from the past 240 hour(s))  Respiratory Panel by RT PCR (Flu A&B, Covid) - Nasopharyngeal Swab     Status: None   Collection Time: 11/30/19 11:07 AM   Specimen: Nasopharyngeal Swab  Result Value Ref Range Status   SARS Coronavirus 2 by RT PCR NEGATIVE NEGATIVE Final    Comment: (NOTE) SARS-CoV-2 target nucleic acids are NOT DETECTED. The SARS-CoV-2 RNA is generally detectable in upper respiratoy specimens during the acute phase of infection. The lowest concentration of SARS-CoV-2 viral copies this assay can detect is 131 copies/mL. A negative result does not preclude SARS-Cov-2 infection and should not be used as the sole basis for treatment or other patient management decisions. A negative result may occur with  improper specimen collection/handling, submission of specimen other than nasopharyngeal swab, presence of viral mutation(s) within the areas targeted by this assay, and inadequate number of viral copies (<131 copies/mL). A negative result must be combined with clinical observations, patient history, and epidemiological information. The expected result is  Negative. Fact Sheet for Patients:  PinkCheek.be Fact Sheet for Healthcare Providers:  GravelBags.it This test is not yet ap proved or cleared by the [place] FDA and  has been authorized for detection and/or diagnosis of SARS-CoV-2 by FDA under an Emergency Use Authorization (EUA). This EUA will remain  in effect (meaning this test can be used) for the duration of the COVID-19 declaration under Section 564(b)(1) of the Act, 21 U.S.C. section 360bbb-3(b)(1), unless the authorization is terminated or revoked sooner.    Influenza A by PCR NEGATIVE NEGATIVE Final   Influenza B by PCR NEGATIVE NEGATIVE Final    Comment: (NOTE) The Xpert Xpress SARS-CoV-2/FLU/RSV assay is intended as an aid in  the diagnosis of influenza from Nasopharyngeal swab specimens and  should not be used as a sole basis for treatment. Nasal washings and  aspirates are unacceptable for Xpert Xpress SARS-CoV-2/FLU/RSV  testing. Fact Sheet for Patients: PinkCheek.be Fact Sheet for Healthcare Providers: GravelBags.it This test is not yet approved or cleared by the [place] FDA and  has been authorized for detection and/or diagnosis of SARS-CoV-2 by  FDA under an Emergency Use Authorization (EUA). This EUA will remain  in effect (meaning this test can be used) for the duration of the  Covid-19 declaration under Section 564(b)(1) of the Act, 21  U.S.C. section 360bbb-3(b)(1), unless the authorization is  terminated or revoked. Performed at [facility], [address] [place], [place] [address]      Time coordinating discharge: 35 minutes  SIGNED:   Rodena Goldmann, DO [facility] 12/02/2019, 9:42 AM  If 7PM-7AM, please contact night-coverage [email or web address]

## 2019-12-04 ENCOUNTER — Other Ambulatory Visit: Payer: Self-pay

## 2019-12-04 ENCOUNTER — Ambulatory Visit (INDEPENDENT_AMBULATORY_CARE_PROVIDER_SITE_OTHER): Payer: Medicare Other | Admitting: Family Medicine

## 2019-12-04 ENCOUNTER — Encounter: Payer: Self-pay | Admitting: Family Medicine

## 2019-12-04 VITALS — BP 110/78 | HR 99 | Temp 96.9°F | Wt 230.4 lb

## 2019-12-04 DIAGNOSIS — N1831 Chronic kidney disease, stage 3a: Secondary | ICD-10-CM | POA: Diagnosis not present

## 2019-12-04 DIAGNOSIS — E785 Hyperlipidemia, unspecified: Secondary | ICD-10-CM | POA: Diagnosis not present

## 2019-12-04 DIAGNOSIS — I10 Essential (primary) hypertension: Secondary | ICD-10-CM | POA: Diagnosis not present

## 2019-12-04 DIAGNOSIS — N289 Disorder of kidney and ureter, unspecified: Secondary | ICD-10-CM | POA: Diagnosis not present

## 2019-12-04 DIAGNOSIS — R188 Other ascites: Secondary | ICD-10-CM | POA: Diagnosis not present

## 2019-12-04 DIAGNOSIS — J189 Pneumonia, unspecified organism: Secondary | ICD-10-CM

## 2019-12-04 DIAGNOSIS — Z794 Long term (current) use of insulin: Secondary | ICD-10-CM | POA: Diagnosis not present

## 2019-12-04 DIAGNOSIS — E1121 Type 2 diabetes mellitus with diabetic nephropathy: Secondary | ICD-10-CM

## 2019-12-04 MED ORDER — TORSEMIDE 20 MG PO TABS: ORAL_TABLET | ORAL | 0 refills | Status: DC

## 2019-12-04 NOTE — Progress Notes (Signed)
   Subjective:    Patient ID: Jerry Burns, male    DOB: 11/25/1933, 86 y.o.   MRN: [id number]  HPI Patient comes in today for hospital follow up. Patient has complaints of SOB worse with exertion and wearing a mask.  Very nice patient Was recently in the hospital because of elevated creatinine and sodium 125 Patient is restricting his fluids Having problems with pedal edema Also bloating in the abdomen with belching and also recent ultrasound which showed ascites although small amount Patient does relate shortness of breath with activity Denies chest pressure or pain  Patient continues to have weight gain while following fluid restriction.   Patient complains of fatigue. Patient was seen by myself Saturday morning at the hospital as a courtesy visit and he was told to follow-up today regarding his hospitalization Patient also being followed for his anemia which is being followed by hematology felt to be anemia of chronic disease Review of Systems  Constitutional: Positive for fatigue. Negative for diaphoresis.  HENT: Negative for congestion and rhinorrhea.   Respiratory: Positive for shortness of breath. Negative for cough.   Cardiovascular: Positive for leg swelling. Negative for chest pain.  Gastrointestinal: Positive for abdominal distention. Negative for abdominal pain and diarrhea.  Skin: Negative for color change and rash.  Neurological: Negative for dizziness and headaches.  Psychiatric/Behavioral: Negative for behavioral problems and confusion.       Objective:   Physical Exam Abdomen is soft no guarding or rebound positive bowel sounds extremities 1+ edema Skin warm dry lungs are clear no crackles heart regular no murmurs  Pitting edema of the lower legs    Assessment & Plan:  1. Essential hypertension Blood pressure is reasonable continue current measures.   - Basic metabolic panel - Hepatic function panel - CBC with Differential/Platelet - Magnesium  2.  Type 2 diabetes mellitus with stage 3a chronic kidney disease, with long-term current use of insulin ([place]) Diabetes under decent control but he has been coached on what to watch for regarding low sugars and avoid low sugars - Basic metabolic panel - Hepatic function panel - CBC with Differential/Platelet - Magnesium  3. Hyperlipidemia, unspecified hyperlipidemia type Continue cholesterol medicine - Basic metabolic panel - Hepatic function panel - CBC with Differential/Platelet - Magnesium  4. Other ascites Patient will be seen gastroenterology because of bloating abdominal swelling and ascites he will see them at the end of this month - Basic metabolic panel - Hepatic function panel - CBC with Differential/Platelet - Magnesium  5. Pneumonia due to infectious organism, unspecified laterality, unspecified part of lung Patient will do a follow-up chest x-ray he is finished up antibiotics lungs sound good today O2 sat good - DG Chest 2 View  6. Renal insufficiency Has significant renal insufficiency and chronic kidney disease.  Does need to get in with nephrology they saw him while he was in the hospital we will do the torsemide half tablet daily - Ambulatory referral to Nephrology  Anemia being followed by hematology Lab work ordered for next week Patient to do lab work next Monday I should be able to follow-up on the lab work Tuesday or Wednesday DOE patient had echo ordered we will need to follow through to make sure he gets this

## 2019-12-05 NOTE — Telephone Encounter (Signed)
No precert required, appt scheduled, pt aware

## 2019-12-08 ENCOUNTER — Other Ambulatory Visit: Payer: Self-pay | Admitting: *Deleted

## 2019-12-08 NOTE — Patient Outreach (Signed)
[facility]) Care Management  12/08/2019  ATILLA ZOLLNER 1934/02/15 [id number]   EMMI-GENERAL DISCHARGE-RESOLVED RED ON EMMI ALERT Day # 4 Date: 12/07/2019 Red Alert Reason: Lost interest/sad/hopeless/anxious/empty  Outreach #1: RN spoke with pt today and inquired on the above emmi. Pt states it was an honest answer however denies any suicidal thoughts and has a supportive family. Pt is very aware of his medical condition and has spoke with his provider with no concerns. Pt declined any need for counseling or consult with a psychiatrist at this time and decline just offer for assistance with this task at this time. Pt very grateful for the follow up call but again has no needs at this time.  Plan: Pt does not feel he needs a follow up call and again declines any offered services with no additional needs at this time.  Raina Mina, RN Care Management Coordinator [place] Office [phone]

## 2019-12-11 ENCOUNTER — Encounter: Payer: Self-pay | Admitting: Family Medicine

## 2019-12-11 DIAGNOSIS — N1831 Chronic kidney disease, stage 3a: Secondary | ICD-10-CM | POA: Diagnosis not present

## 2019-12-11 DIAGNOSIS — R188 Other ascites: Secondary | ICD-10-CM | POA: Diagnosis not present

## 2019-12-11 DIAGNOSIS — I10 Essential (primary) hypertension: Secondary | ICD-10-CM | POA: Diagnosis not present

## 2019-12-11 DIAGNOSIS — E1121 Type 2 diabetes mellitus with diabetic nephropathy: Secondary | ICD-10-CM | POA: Diagnosis not present

## 2019-12-11 DIAGNOSIS — E785 Hyperlipidemia, unspecified: Secondary | ICD-10-CM | POA: Diagnosis not present

## 2019-12-11 DIAGNOSIS — Z794 Long term (current) use of insulin: Secondary | ICD-10-CM | POA: Diagnosis not present

## 2019-12-12 ENCOUNTER — Other Ambulatory Visit: Payer: Self-pay | Admitting: Family Medicine

## 2019-12-12 ENCOUNTER — Telehealth: Payer: Self-pay | Admitting: Family Medicine

## 2019-12-12 DIAGNOSIS — E875 Hyperkalemia: Secondary | ICD-10-CM

## 2019-12-12 LAB — MAGNESIUM: Magnesium: 2.2 mg/dL (ref 1.6–2.3)

## 2019-12-12 LAB — CBC WITH DIFFERENTIAL/PLATELET
Basophils Absolute: 0.1 10*3/uL (ref 0.0–0.2)
Basos: 1 %
EOS (ABSOLUTE): 0.3 10*3/uL (ref 0.0–0.4)
Eos: 3 %
Hematocrit: 30.7 % — ABNORMAL LOW (ref 37.5–51.0)
Hemoglobin: 10.2 g/dL — ABNORMAL LOW (ref 13.0–17.7)
Immature Grans (Abs): 0 10*3/uL (ref 0.0–0.1)
Immature Granulocytes: 1 %
Lymphocytes Absolute: 0.8 10*3/uL (ref 0.7–3.1)
Lymphs: 10 %
MCH: 31.2 pg (ref 26.6–33.0)
MCHC: 33.2 g/dL (ref 31.5–35.7)
MCV: 94 fL (ref 79–97)
Monocytes Absolute: 1 10*3/uL — ABNORMAL HIGH (ref 0.1–0.9)
Monocytes: 12 %
Neutrophils Absolute: 6 10*3/uL (ref 1.4–7.0)
Neutrophils: 73 %
Platelets: 264 10*3/uL (ref 150–450)
RBC: 3.27 x10E6/uL — ABNORMAL LOW (ref 4.14–5.80)
RDW: 12.4 % (ref 11.6–15.4)
WBC: 8.1 10*3/uL (ref 3.4–10.8)

## 2019-12-12 LAB — HEPATIC FUNCTION PANEL
ALT: 24 IU/L (ref 0–44)
AST: 17 IU/L (ref 0–40)
Albumin: 4.1 g/dL (ref 3.6–4.6)
Alkaline Phosphatase: 140 IU/L — ABNORMAL HIGH (ref 48–121)
Bilirubin Total: 0.6 mg/dL (ref 0.0–1.2)
Bilirubin, Direct: 0.23 mg/dL (ref 0.00–0.40)
Total Protein: 6.2 g/dL (ref 6.0–8.5)

## 2019-12-12 LAB — BASIC METABOLIC PANEL
BUN/Creatinine Ratio: 21 (ref 10–24)
BUN: 39 mg/dL — ABNORMAL HIGH (ref 8–27)
CO2: 21 mmol/L (ref 20–29)
Calcium: 9.5 mg/dL (ref 8.6–10.2)
Chloride: 104 mmol/L (ref 96–106)
Creatinine, Ser: 1.82 mg/dL — ABNORMAL HIGH (ref 0.76–1.27)
GFR calc Af Amer: 39 mL/min/{1.73_m2} — ABNORMAL LOW (ref >59)
GFR calc non Af Amer: 33 mL/min/{1.73_m2} — ABNORMAL LOW (ref >59)
Glucose: 129 mg/dL — ABNORMAL HIGH (ref 65–99)
Potassium: 6 mmol/L — ABNORMAL HIGH (ref 3.5–5.2)
Sodium: 140 mmol/L (ref 134–144)

## 2019-12-12 NOTE — Telephone Encounter (Signed)
Pt contacted and verbalized understanding.  

## 2019-12-12 NOTE — Telephone Encounter (Signed)
Nurses Please see result note regarding his labs and potassium thank you

## 2019-12-13 DIAGNOSIS — E875 Hyperkalemia: Secondary | ICD-10-CM | POA: Diagnosis not present

## 2019-12-14 LAB — BASIC METABOLIC PANEL
BUN/Creatinine Ratio: 23 (ref 10–24)
BUN: 35 mg/dL — ABNORMAL HIGH (ref 8–27)
CO2: 24 mmol/L (ref 20–29)
Calcium: 9.5 mg/dL (ref 8.6–10.2)
Chloride: 106 mmol/L (ref 96–106)
Creatinine, Ser: 1.54 mg/dL — ABNORMAL HIGH (ref 0.76–1.27)
GFR calc Af Amer: 47 mL/min/{1.73_m2} — ABNORMAL LOW (ref >59)
GFR calc non Af Amer: 41 mL/min/{1.73_m2} — ABNORMAL LOW (ref >59)
Glucose: 86 mg/dL (ref 65–99)
Potassium: 5.2 mmol/L (ref 3.5–5.2)
Sodium: 142 mmol/L (ref 134–144)

## 2019-12-15 ENCOUNTER — Ambulatory Visit (HOSPITAL_COMMUNITY)
Admission: RE | Admit: 2019-12-15 | Discharge: 2019-12-15 | Disposition: A | Discharge status: Home / Self Care | Payer: Medicare Other | Source: Ambulatory Visit | Attending: Family Medicine | Admitting: Family Medicine

## 2019-12-15 ENCOUNTER — Other Ambulatory Visit: Payer: Self-pay

## 2019-12-15 DIAGNOSIS — R0609 Other forms of dyspnea: Secondary | ICD-10-CM

## 2019-12-15 DIAGNOSIS — R0600 Dyspnea, unspecified: Secondary | ICD-10-CM | POA: Insufficient documentation

## 2019-12-15 DIAGNOSIS — I3139 Other pericardial effusion (noninflammatory): Secondary | ICD-10-CM

## 2019-12-15 DIAGNOSIS — I313 Pericardial effusion (noninflammatory): Secondary | ICD-10-CM | POA: Diagnosis not present

## 2019-12-15 NOTE — Progress Notes (Signed)
*  PRELIMINARY RESULTS* Echocardiogram 2D Echocardiogram has been performed.  Samuel [place] 12/15/2019, 11:34 AM

## 2019-12-18 ENCOUNTER — Other Ambulatory Visit: Payer: Self-pay | Admitting: *Deleted

## 2019-12-18 ENCOUNTER — Other Ambulatory Visit: Payer: Self-pay | Admitting: Family Medicine

## 2019-12-18 DIAGNOSIS — I3139 Other pericardial effusion (noninflammatory): Secondary | ICD-10-CM

## 2019-12-19 ENCOUNTER — Ambulatory Visit (INDEPENDENT_AMBULATORY_CARE_PROVIDER_SITE_OTHER): Payer: Medicare Other | Admitting: Gastroenterology

## 2019-12-19 ENCOUNTER — Other Ambulatory Visit: Payer: Self-pay

## 2019-12-19 ENCOUNTER — Encounter: Payer: Self-pay | Admitting: Family Medicine

## 2019-12-19 ENCOUNTER — Ambulatory Visit (HOSPITAL_COMMUNITY)
Admission: RE | Admit: 2019-12-19 | Discharge: 2019-12-19 | Disposition: A | Discharge status: Home / Self Care | Payer: Medicare Other | Source: Ambulatory Visit | Attending: Family Medicine | Admitting: Family Medicine

## 2019-12-19 ENCOUNTER — Encounter: Payer: Self-pay | Admitting: Gastroenterology

## 2019-12-19 VITALS — BP 134/75 | HR 73 | Temp 97.0°F | Ht 71.0 in | Wt 228.4 lb

## 2019-12-19 DIAGNOSIS — A048 Other specified bacterial intestinal infections: Secondary | ICD-10-CM | POA: Diagnosis not present

## 2019-12-19 DIAGNOSIS — D649 Anemia, unspecified: Secondary | ICD-10-CM | POA: Diagnosis not present

## 2019-12-19 DIAGNOSIS — K5900 Constipation, unspecified: Secondary | ICD-10-CM | POA: Insufficient documentation

## 2019-12-19 DIAGNOSIS — R933 Abnormal findings on diagnostic imaging of other parts of digestive tract: Secondary | ICD-10-CM

## 2019-12-19 DIAGNOSIS — R188 Other ascites: Secondary | ICD-10-CM | POA: Diagnosis not present

## 2019-12-19 DIAGNOSIS — J189 Pneumonia, unspecified organism: Secondary | ICD-10-CM | POA: Insufficient documentation

## 2019-12-19 MED ORDER — LINACLOTIDE 72 MCG PO CAPS: 72.0000 ug | ORAL_CAPSULE | Freq: Every day | ORAL | 3 refills | Status: DC

## 2019-12-19 NOTE — Progress Notes (Signed)
Primary Care Physician: Kathyrn Drown, MD  Primary Gastroenterologist:  Garfield Cornea, MD  Chief Complaint  Patient presents with  . Anemia  . Constipation    straining at times, BM's daily, no bleeding    HPI: Jerry Burns is a 86 y.o. male here for further evaluation of anemia, ascites at the request of Dr. Sallee Lange.  Past medical history significant for recent multifocal pneumonia, diastolic CHF, CAD, CVA, type 2 diabetes, hypertension, chronic kidney disease stage IIIa, chronic anemia followed by hematology, previous DVT with presumed PE in 08/2018 treated with Eliquis.  He was last seen in September 2019 for anemia and heme positive stool.  EGD and colonoscopy at that time showed rather severe ulcerative reflux esophagitis, H. pylori gastritis. He had colonic diverticulosis, single tubular adenoma removed from the colon.  No plans for future colonoscopy for screening or surveillance purposes.   He has been receiving iron infusions, the last one in March.  Patient was admitted earlier this month.  Originally diagnosed with multifocal pneumonia in the ED on May 2, started on Augmentin initially and then switched to [facility] for possible GI upset.  Was sent back to the ED on May 6 with electrolyte abnormalities, general malaise/weakness/shortness of breath.  Noted to have hyponatremia and hyperkalemia with acute on chronic kidney failure. Noted to have distended abdomen with lower extremity edema as well.  His AKI was felt to be related to contrast-induced nephropathy from recent CT scan (on 11/26/19) in the presence of Metformin. At time of discharge his creatinine was improved at 1.7 with baseline being around 1.3.  He was sent home on fluid restrictions 1.5 L/day.  Sodium level had improved from 123 to 129.  He had a CT angio chest on Nov 26, 2019.  He had multifocal pneumonia, tiny right pleural effusion, asbestos related pleural disease, new small pericardial effusion. 2-3  right middle lobe nodule requiring one year follow up. No PE. CT A/P with contrast same day showed generalized fat stranding ruq appears centered on the duodenum suspicious for duodenitis, nondistended gb with suggestion of mild wall thickening and pericholecystic edema may be reactive related to duodenal inflammation. Small amount of upper abdomen ascites. Bilateral inguinal hernias contain ascites. Mild bladder wall thickening can be seen with UTI. Liver measuring 20cm.   Abd u/s 11/30/19 with decompressed gb, nonspecific gb wall thickening. Small volume ascites.   Patient states six weeks ago started having lower extremity edema and abdominal distention. Was drinking more than 12 glasses of water daily which he typically does. He has h/o CHF requiring hospitalization with diuresis in the past. In the past six weeks, he has been treated for PNA, hyponatremia, and acute renal failure as outlined above. He is now on fluid restriction due to hyponatremia and volume overload. Everything is a strain, no energy. Not sure about diet, too many changes. Wants to see a dietician and plans to ask PCP about it tomorrow. Finds it hard to get out of chair. DOE with walking through his house. Having some constipation, fiber supplement not helping. Took Exlax yesterday with results but afraid he will mess his kidneys up more. No melena, brbpr. Continues to feel bloating in the upper abdomen. Stopped pantoprazole about 8 weeks ago due to chronic kidney disease to limit potential risks. Switched to Pepcid but states it was stopped yesterday.   Review of chart and call to pharmacy by our staff, Almyra Free LPN, confirms patient was not treated for  H.pylori in 2019. Discussed with patient today.  His son's father-in-law is a Stage manager and has looked at his scans. Agrees with findings. His nephew is a Secondary school teacher and granddaughter is a PA.   Wt Readings from Last 3 Encounters:  12/19/19 228 lb 6.4 oz (103.6 kg)  12/04/19 230 lb  6.4 oz (104.5 kg)  12/02/19 226 lb 11.2 oz (102.8 kg)   Patient reports dry weight of 200-205 which he weighed in 01/2019. In 07/2019 he weighed 215 pounds and more recently up 10-15 pounds.   ECHO with moderate circumferential pericardial effusion with pending cardiology referral.   Nephrology referral pending.     Current Outpatient Medications  Medication Sig Dispense Refill  . amLODipine (NORVASC) 5 MG tablet TAKE (1) TABLET BY MOUTH ONCE DAILY. (Patient taking differently: Take 5 mg by mouth daily. TAKE (1) TABLET BY MOUTH ONCE DAILY.) 90 tablet 1  . Ascorbic Acid (VITAMIN C) 1000 MG tablet Take 500 mg by mouth in the morning and at bedtime.     Marland Kitchen aspirin EC 81 MG tablet Take 81 mg by mouth daily.    Marland Kitchen atorvastatin (LIPITOR) 80 MG tablet TAKE (1) TABLET BY MOUTH ONCE DAILY. (Patient taking differently: Take 80 mg by mouth daily. TAKE (1) TABLET BY MOUTH ONCE DAILY.) 90 tablet 1  . carvedilol (COREG) 12.5 MG tablet TAKE (1) TABLET BY MOUTH TWICE DAILY. (Patient taking differently: Take 12.5 mg by mouth in the morning and at bedtime. TAKE (1) TABLET BY MOUTH TWICE DAILY.) 180 tablet 3  . COMBIGAN 0.2-0.5 % ophthalmic solution Place 1 drop into the left eye 2 (two) times daily.   2  . Cyanocobalamin (B-12) 2500 MCG TABS Take 1 tablet by mouth 3 (three) times a week. Three times a week on Monday Wednesday and Friday     . dutasteride (AVODART) 0.5 MG capsule TAKE ONE CAPSULE BY MOUTH ONCE DAILY. (Patient taking differently: Take 0.5 mg by mouth daily. ) 90 capsule 0  . Emollient (CERAVE) CREA Apply 1 application topically daily at 6 (six) AM.     . erythromycin ophthalmic ointment Place 1 application into the left eye 3 (three) times daily as needed (infection).     . famotidine (PEPCID) 20 MG tablet Take one tablet po daily (Patient taking differently: Take 20 mg by mouth daily. Take one tablet po daily) 90 tablet 1  . Flaxseed, Linseed, (FLAXSEED OIL PO) Take 1 capsule by mouth daily.     Marland Kitchen  glucose blood (CONTOUR NEXT TEST) test strip USE 1 STRIP TO CHECK GLUCOSE THREE TIMES DAILY. FOR ICD 10 E11.9 300 each 0  . insulin lispro (HUMALOG KWIKPEN) 100 UNIT/ML KwikPen Inject twice daily per following: Breakfast 2 units; if 150-250 then 3 units; if greater than 250 5 units. Supper-3 units but if 150-250 do 4 units; if greater that 250 6 units (Patient taking differently: Inject 2-4 Units into the skin See admin instructions. Inject 2 units at breakfast and 4 units at supper daily) 15 mL 5  . Lactobacillus (PROBIOTIC ACIDOPHILUS) CAPS Take 1 capsule by mouth daily at 6 (six) AM.    . LANTUS SOLOSTAR 100 UNIT/ML Solostar Pen INJECT 22 UNITS SUBCUTANEOUSLY ONCE DAILY AT 10PM (Patient taking differently: Inject 22 Units into the skin every evening. ) 15 mL 2  . mometasone (ELOCON) 0.1 % cream APPLY TO AFFECTED AREAS ONCE DAILY. (Patient taking differently: Apply 1 application topically daily as needed. ) 45 g 3  . Multiple Vitamin (MULTIVITAMIN) tablet Take  1 tablet by mouth daily.    . nitroGLYCERIN (NITROSTAT) 0.4 MG SL tablet PLACE 1 TAB UNDER TONGUE EVERY 5 MIN IF NEEDED FOR CHEST PAIN. MAY USE 3 TIMES.NO RELIEF CALL 911. (Patient taking differently: Place 0.4 mg under the tongue every 5 (five) minutes as needed. PLACE 1 TAB UNDER TONGUE EVERY 5 MIN IF NEEDED FOR CHEST PAIN. MAY USE 3 TIMES.NO RELIEF CALL 911.) 25 tablet 3  . Polyvinyl Alcohol-Povidone (REFRESH OP) Place 1 drop into both ears 2 (two) times daily as needed (dryness).     . sitaGLIPtin (JANUVIA) 100 MG tablet TAKE (1) TABLET BY MOUTH ONCE DAILY. (Patient taking differently: Take 100 mg by mouth daily. TAKE (1) TABLET BY MOUTH ONCE DAILY.) 30 tablet 5  . tamsulosin (FLOMAX) 0.4 MG CAPS capsule TAKE (1) CAPSULE BY MOUTH ONCE DAILY. (Patient taking differently: Take 0.4 mg by mouth daily. ) 90 capsule 0  . torsemide (DEMADEX) 20 MG tablet 1/2 tablet every morning 45 tablet 0  . Turmeric 500 MG CAPS Take 500 mg by mouth daily.     No  current facility-administered medications for this visit.    Allergies as of 12/19/2019 - Review Complete 12/19/2019  Allergen Reaction Noted  . Adhesive [tape]  05/11/2018  . Sulfa antibiotics Other (See Comments) 06/04/2015  . Latex Other (See Comments) 06/29/2012   Past Medical History:  Diagnosis Date  . Anginal pain ([place])   . Arthritis    Bilateral knee  . CHF (congestive heart failure) ([place])   . Coronary artery disease   . Diabetes mellitus    insulin dependent   . DVT, lower extremity, distal, acute ([place]) 09/09/2018  . Hyperlipidemia   . Hypertension   . Progressive angina ([place]) 03/2015   a. s/p DES to proximal LAD and balloon angioplasty to distal LAD in 2016 b. patent stent by repeat cath in 01/2017 with mild residual CAD)  . Stroke [facility])    Past Surgical History:  Procedure Laterality Date  . APPENDECTOMY    . CARDIAC CATHETERIZATION N/A 04/12/2015   Procedure: Left Heart Cath and Coronary Angiography;  Surgeon: Sherren Mocha, MD; pLAD 75>0% w/ 3.0x12 mm Resolute DES, dLAD 80>0% w/ Angiosculpt scoring balloon, CFX luminal irreg, RCA mild dz, EF 55-65%   . COLONOSCOPY  2011   RMR: 1. Normal rectum 2. Left sided diverticula , 2 ascending colon polyps, status post snare polypectomy. Remainder of colonic mucosa appeared unremarkable.   . COLONOSCOPY N/A 05/13/2016   Procedure: COLONOSCOPY;  Surgeon: Daneil Dolin, MD;  Location[facility];  Service: Endoscopy;  Laterality: N/A;  7:30 AM  . COLONOSCOPY N/A 05/18/2018   [place]: Diverticulosis, single tubular adenoma removed.  No plans for future screening or surveillance colonoscopy due to age.  . CORONARY ANGIOGRAPHY N/A 02/04/2017   Procedure: CORONARY ANGIOGRAPHY (CATH LAB);  Surgeon: [place] Sine, MD;  Location: [place] CV LAB;  Service: Cardiovascular;  Laterality: N/A;  . CORONARY ANGIOPLASTY WITH STENT PLACEMENT  08/22/2014   PTCA/DES X mLAD with 2.5 x 16 mm Promus Premier DES by Dr Julianne Handler  .  ESOPHAGOGASTRODUODENOSCOPY N/A 05/18/2018   [place]: Ulcerative reflux esophagitis, hiatal hernia, erosive gastropathy with H. pylori on biopsy, also in the setting of NSAID use.  Marland Kitchen FOOT SURGERY Right   . LEFT HEART CATHETERIZATION WITH CORONARY ANGIOGRAM N/A 08/22/2014   Procedure: LEFT HEART CATHETERIZATION WITH CORONARY ANGIOGRAM;  Surgeon: Burnell Blanks, MD;  Location: [facility] CATH LAB;  Service: Cardiovascular;  Laterality: N/A;  .  PERCUTANEOUS CORONARY STENT INTERVENTION (PCI-S)  08/22/2014   Procedure: PERCUTANEOUS CORONARY STENT INTERVENTION (PCI-S);  Surgeon: Burnell Blanks, MD;   . SHOULDER SURGERY    . TONSILLECTOMY     Family History  Problem Relation Age of Onset  . Diabetes Other   . Diabetes Brother   . Colon cancer Maternal Uncle   . Skin cancer Mother   . Diabetes Father   . Hypertension Sister   . Skin cancer Sister    Social History   Tobacco Use  . Smoking status: Former Smoker    Packs/day: 1.00    Years: 20.00    Pack years: 20.00    Types: Cigarettes, Cigars    Start date: 08/10/1955    Quit date: 07/28/2007    Years since quitting: 12.4  . Smokeless tobacco: Never Used  Substance Use Topics  . Alcohol use: Not Currently    Alcohol/week: 0.0 standard drinks    Comment: Daily about 1 glass of wine  . Drug use: No    ROS:  General: Negative for anorexia, weight loss, fever, chills, +fatigue, +weakness. ENT: Negative for hoarseness, difficulty swallowing , nasal congestion. CV: Negative for chest pain, angina, palpitations, dyspnea on exertion, +peripheral edema.  Respiratory: Negative for dyspnea at rest,+ dyspnea on exertion, no cough, sputum, wheezing.  GI: See history of present illness. GU:  Negative for dysuria, hematuria, urinary incontinence, urinary frequency, nocturnal urination.  Endo: see hpi   Physical Examination:   BP 134/75   Pulse 73   Temp (!) 97 F (36.1 C) (Oral)   Ht 5\' 11"  (1.803 m)   Wt 228 lb 6.4 oz (103.6 kg)    BMI 31.86 kg/m   General: Well-nourished, well-developed in no acute distress.  Eyes: No icterus. Mouth: masked. Lungs: Clear to auscultation bilaterally.  Heart: Regular rate and rhythm, no murmurs rubs or gallops.  Abdomen: Bowel sounds are normal, nontender, nondistended, no hepatosplenomegaly or masses, no abdominal bruits or hernia , no rebound or guarding.   Extremities: 2+ pitting edema to knees bilaterally. No clubbing or deformities. Neuro: Alert and oriented x 4   Skin: Warm and dry, no jaundice.   Psych: Alert and cooperative, normal mood and affect.  Labs:  Lab Results  Component Value Date   CREATININE 1.54 (H) 12/13/2019   BUN 35 (H) 12/13/2019   NA 142 12/13/2019   K 5.2 12/13/2019   CL 106 12/13/2019   CO2 24 12/13/2019   Lab Results  Component Value Date   ALT 24 12/11/2019   AST 17 12/11/2019   ALKPHOS 140 (H) 12/11/2019   BILITOT 0.6 12/11/2019   Lab Results  Component Value Date   WBC 8.1 12/11/2019   HGB 10.2 (L) 12/11/2019   HCT 30.7 (L) 12/11/2019   MCV 94 12/11/2019   PLT 264 12/11/2019   Lab Results  Component Value Date   INR 1.03 09/02/2018   INR 1.06 02/02/2017   INR 1.11 04/12/2015   Lab Results  Component Value Date   IRON 77 09/21/2019   TIBC 273 09/21/2019   FERRITIN 1,111 (H) 11/29/2019   Lab Results  Component Value Date   FOLATE 19.1 09/21/2019   Lab Results  Component Value Date   VITAMINB12 >2000 (H) 11/29/2019     Imaging Studies: CT Angio Chest PE W/Cm &/Or Wo Cm  Result Date: 11/26/2019 CLINICAL DATA:  Shortness of breath 1 week with fatigue and abdominal distension. Hypoxia. EXAM: CT ANGIOGRAPHY CHEST WITH CONTRAST TECHNIQUE: Multidetector  CT imaging of the chest was performed using the standard protocol during bolus administration of intravenous contrast. Multiplanar CT image reconstructions and MIPs were obtained to evaluate the vascular anatomy. CONTRAST:  175mL OMNIPAQUE IOHEXOL 350 MG/ML SOLN COMPARISON:   02/12/2019 FINDINGS: Cardiovascular: Stable borderline cardiomegaly. There is a small pericardial effusion which is new. Calcified plaque over the left main and 3 vessel coronary arteries. Thoracic aorta is normal caliber without aneurysm or dissection. Pulmonary arterial system is well opacified without evidence of emboli. Remaining vascular structures are unremarkable. Mediastinum/Nodes: No mediastinal or hilar adenopathy. Remaining mediastinal structures are unremarkable. Lungs/Pleura: Lungs are adequately inflated. There multiple bilateral calcified pleural plaques. There is mild patchy airspace opacification over the right middle lobe and lower lobes suggesting infection. Tiny amount right pleural fluid with mild right posterior basilar atelectasis. Airways are normal. 2-3 mm nodule over the right middle lobe. Upper Abdomen: Nonspecific 1.4 cm lymph node over the gastrohepatic ligament unchanged. Calcified plaque over the abdominal aorta. No acute findings. Musculoskeletal: Degenerative change of the spine. Review of the MIP images confirms the above findings. IMPRESSION: 1.  No evidence of pulmonary embolism. 2. Mild patchy airspace process over the lower lobes and right middle lobe suggesting multifocal infection. Tiny amount right pleural fluid with basilar atelectasis. 3. Calcified pleural plaques bilaterally compatible with asbestos related pleural disease. 4. 2-3 mm right middle lobe nodule. Recommend follow-up CT 1 year. This recommendation follows the consensus statement: Guidelines for Management of Incidental Pulmonary Nodules Detected on CT Images: From the Fleischner Society 2017; Radiology 2017; 284:228-243. 5.  Small pericardial effusion which is new. 6. Aortic Atherosclerosis (ICD10-I70.0). Atherosclerotic coronary artery disease. 7.  Stable nonspecific 1.4 cm gastrohepatic ligament lymph node. Electronically Signed   By: Marin Olp M.D.   On: 11/26/2019 16:10   US Abdomen Complete  Result  Date: 11/30/2019 CLINICAL DATA:  Abdominal distension for 5 days EXAM: ABDOMEN ULTRASOUND COMPLETE COMPARISON:  11/26/2019 FINDINGS: Gallbladder: Gallbladder is decompressed, with nonspecific gallbladder wall thickening measuring 6 mm. No evidence of cholelithiasis. Common bile duct: Diameter: 2 mm. Liver: No focal lesion identified. Within normal limits in parenchymal echogenicity. Portal vein is patent on color Doppler imaging with normal direction of blood flow towards the liver. IVC: No abnormality visualized. Pancreas: Visualized portion unremarkable. Spleen: Size and appearance within normal limits. Right Kidney: Length: 10.7 cm. Echogenicity within normal limits. No mass or hydronephrosis visualized. Left Kidney: Length: 11.5 cm. Echogenicity within normal limits. No mass or hydronephrosis visualized. Abdominal aorta: No aneurysm visualized. Other findings: Small volume ascites greatest in the left upper quadrant. IMPRESSION: 1. Nonspecific gallbladder wall thickening given decompressed gallbladder. No evidence of cholelithiasis. 2. Small volume ascites. 3. Otherwise unremarkable exam. Electronically Signed   By: Randa Ngo M.D.   On: 11/30/2019 15:22   CT Abdomen Pelvis W Contrast  Result Date: 11/26/2019 CLINICAL DATA:  Abdominal distension. Shortness of breath. EXAM: CT ABDOMEN AND PELVIS WITH CONTRAST TECHNIQUE: Multidetector CT imaging of the abdomen and pelvis was performed using the standard protocol following bolus administration of intravenous contrast. CONTRAST:  117mL OMNIPAQUE IOHEXOL 350 MG/ML SOLN COMPARISON:  Report from CT 02/12/2019, images not available. CT 08/03/2017 reviewed. Examination was performed in conjunction with CTA of the chest, reported separately. FINDINGS: Lower chest: Assessed fully on associated chest CT. Trace bilateral pleural effusions. Patchy bibasilar opacities. There is a small pericardial effusion. Perihepatic calcifications abutting the dome of the liver appear  to represent diaphragmatic calcifications rather than focal liver lesion. Hepatobiliary: Upper normal  size liver spanning 20 cm cranial caudal. Calcifications about the right dome unchanged from prior exam, felt to be diaphragmatic rather than calcified hepatic lesion. This is unchanged from prior exam. Gallbladder is decompressed. No calcified gallstone. Equivocal gallbladder wall thickening versus nondistention. Mild edema in the right upper quadrant, bat does not appear centered on the gallbladder. Pancreas: Fatty atrophy. No ductal dilatation. Right upper quadrant edema does not appear centered on the pancreas. There is no evidence pancreatic mass. Spleen: Normal in size without focal abnormality. Adrenals/Urinary Tract: No adrenal nodule. No hydronephrosis. There is symmetric bilateral perinephric edema. Symmetric excretion from both kidneys on delayed phase imaging. Scattered low-density lesions in the kidneys are too small to characterize but likely cysts. Mild wall thickening of the urinary bladder. Mild perivesicular fat stranding Stomach/Bowel: Small hiatal hernia. Stomach is partially distended. There is wall thickening about the second, third, and fourth portion of the duodenum with adjacent fat stranding, for example series 2, image 37. Fat stranding tracks into the right upper quadrant in the porta hepatis. No free air. No small bowel dilatation or obstruction. No other small bowel inflammation. Cecum slightly high-riding in the right mid abdomen. Prior appendectomy per history. Moderate stool in the ascending and transverse colon. Small volume of stool distally. Diverticulosis from the descending colon through the sigmoid. No discrete diverticulitis. No colonic wall thickening. Vascular/Lymphatic: Advanced aortic and branch atherosclerosis. No aortic aneurysm. The portal vein is patent. Mesenteric vessels appear patent. There is an enlarged upper abdominal lymph node in the right upper quadrant  adjacent to the right diaphragmatic crus and IVC that measures 19 mm, this is unchanged from 2019 and presumably reactive. No other enlarged lymph nodes in the abdomen or pelvis. Reproductive: Prostate is unremarkable. Other: Generalized fat stranding in the right upper quadrant appears to be centered on the duodenum. Edema tracks into the central mesentery and right upper quadrant. Small amount of upper abdominal ascites, with greater ascites tracking in the pericolic gutters and into the pelvis. Bilateral inguinal hernias which contain ascites on the left and only fat on the right. No free air or focal abscess. Musculoskeletal: There are no acute or suspicious osseous abnormalities. IMPRESSION: 1. Generalized fat stranding in the right upper quadrant appears centered on the duodenum with mild duodenal wall thickening, suspicious for duodenitis. No free air or focal abscess. 2. Nondistended gallbladder with suggestion of mild wall thickening and pericholecystic edema. This may be reactive related to duodenal inflammation, however is nonspecific. 3. Mild wall thickening of the urinary bladder with perivesicular fat stranding, can be seen with urinary tract infection. 4. Small amount of abdominopelvic ascites. 5. Colonic diverticulosis without diverticulitis, prominent in the sigmoid colon. 6. An enlarged upper abdominal lymph nodes adjacent to the right diaphragmatic crus and IVC is in stable from 2019, presumably benign. Aortic Atherosclerosis (ICD10-I70.0). Electronically Signed   By: Keith Rake M.D.   On: 11/26/2019 16:12   DG Chest Port 1 View  Result Date: 11/30/2019 CLINICAL DATA:  Shortness of breath. Weakness. Pneumonia. EXAM: PORTABLE CHEST 1 VIEW COMPARISON:  11/26/2019 FINDINGS: The heart size and mediastinal contours are within normal limits. Aortic atherosclerosis incidentally noted. Bilateral calcified pleural plaque again seen, consistent with asbestos related pleural disease. Stable mild  left basilar pleural-parenchymal scarring. No evidence of acute pulmonary infiltrate or edema. IMPRESSION: Stable left basilar pleural-parenchymal scarring scarring and asbestos related pleural disease. No acute findings. Electronically Signed   By: Marlaine Hind M.D.   On: 11/30/2019 11:00   DG  Chest Portable 1 View  Result Date: 11/26/2019 CLINICAL DATA:  Shortness of breath since yesterday. EXAM: PORTABLE CHEST 1 VIEW COMPARISON:  02/12/2019 and chest CT 02/11/2019 FINDINGS: Lordotic technique is demonstrated. Lungs are adequately inflated without focal lobar consolidation or effusion. Evidence of calcified pleural plaques bilaterally. Mild stable cardiomegaly. Remainder of the exam is unchanged. IMPRESSION: 1.  No acute cardiopulmonary disease. 2.  Mild stable cardiomegaly. 3.  Evidence of asbestos related pleural disease. Electronically Signed   By: Marin Olp M.D.   On: 11/26/2019 13:41   ECHOCARDIOGRAM COMPLETE  Result Date: 12/15/2019    ECHOCARDIOGRAM REPORT   Patient Name:   Jerry Burns Date of Exam: 12/15/2019 Medical Rec #:  [id number]        Height:       71.0 in Accession #:    [id number]       Weight:       230.4 lb Date of Birth:  September 13, 1933        BSA:          2.239 m Patient Age:    6 years         BP:           130/71 mmHg Patient Gender: M                HR:           76 bpm. Exam Location:  [facility] Procedure: 2D Echo, Cardiac Doppler and Color Doppler Indications:    I31.3 (ICD-10-CM) - Pericardial effusion                 R06.00 (ICD-10-CM) - DOE (dyspnea on exertion)  History:        Patient has prior history of Echocardiogram examinations, most                 recent 09/03/2018. CHF, CAD, Stroke; Risk Factors:Hypertension,                 Diabetes and Dyslipidemia. ASCVD (arteriosclerotic                 cardiovascular disease).  Sonographer:    Alvino Chapel RCS Referring Phys: [phone] [place]  1. Left ventricular ejection fraction, by estimation, is 55%. The  left ventricle has normal function. The left ventricle demonstrates regional wall motion abnormalities (see scoring diagram/findings for description). Left ventricular diastolic parameters are indeterminate.  2. Right ventricular systolic function is normal. The right ventricular size is normal. There is mildly elevated pulmonary artery systolic pressure. The estimated right ventricular systolic pressure is 94.8 mmHg.  3. Left atrial size was moderately dilated.  4. Moderate pericardial effusion. The pericardial effusion is circumferential with anterior predominance. There is evidence of organization and potential chronicity. No clear right ventricular compromise or diagnostic variation in mitral outflow to suggest tamponade physiology.  5. The mitral valve is grossly normal. Mild mitral valve regurgitation.  6. The aortic valve is tricuspid. Aortic valve regurgitation is not visualized.  7. The inferior vena cava is dilated in size with <50% respiratory variability, suggesting right atrial pressure of 15 mmHg. FINDINGS  Left Ventricle: Left ventricular ejection fraction, by estimation, is 55%. The left ventricle has normal function. The left ventricle demonstrates regional wall motion abnormalities. The left ventricular internal cavity size was normal in size. There is  borderline left ventricular hypertrophy. Left ventricular diastolic parameters are indeterminate.  LV Wall Scoring: The mid inferoseptal segment, apical septal segment, and  apical inferior segment are hypokinetic. Right Ventricle: The right ventricular size is normal. No increase in right ventricular wall thickness. Right ventricular systolic function is normal. There is mildly elevated pulmonary artery systolic pressure. The tricuspid regurgitant velocity is 2.30  m/s, and with an assumed right atrial pressure of 15 mmHg, the estimated right ventricular systolic pressure is 27.7 mmHg. Left Atrium: Left atrial size was moderately dilated. Right  Atrium: Right atrial size was normal in size. Pericardium: A moderately sized pericardial effusion is present. The pericardial effusion is circumferential. Mitral Valve: The mitral valve is grossly normal. Mild mitral valve regurgitation. Tricuspid Valve: The tricuspid valve is grossly normal. Tricuspid valve regurgitation is trivial. Aortic Valve: The aortic valve is tricuspid. Aortic valve regurgitation is not visualized. Mild to moderate aortic valve annular calcification. Pulmonic Valve: The pulmonic valve was grossly normal. Pulmonic valve regurgitation is trivial. Aorta: The aortic root is normal in size and structure. Venous: The inferior vena cava is dilated in size with less than 50% respiratory variability, suggesting right atrial pressure of 15 mmHg. IAS/Shunts: No atrial level shunt detected by color flow Doppler.  LEFT VENTRICLE PLAX 2D LVIDd:         4.65 cm LVIDs:         3.24 cm LV PW:         1.18 cm LV IVS:        0.94 cm LVOT diam:     2.10 cm LV SV:         62 LV SV Index:   28 LVOT Area:     3.46 cm  RIGHT VENTRICLE TAPSE (M-mode): 1.4 cm LEFT ATRIUM              Index       RIGHT ATRIUM           Index LA diam:        4.30 cm  1.92 cm/m  RA Area:     18.50 cm LA Vol (A2C):   110.6 ml 49.37 ml/m RA Volume:   47.30 ml  21.12 ml/m LA Vol (A4C):   96.7 ml  43.18 ml/m LA Biplane Vol: 110.0 ml 49.12 ml/m  AORTIC VALVE LVOT Vmax:   83.90 cm/s LVOT Vmean:  58.100 cm/s LVOT VTI:    0.178 m  AORTA Ao Root diam: 3.50 cm MITRAL VALVE               TRICUSPID VALVE MV Area (PHT): 3.33 cm    TR Peak grad:   21.2 mmHg MV Decel Time: 228 msec    TR Vmax:        230.00 cm/s MV E velocity: 95.30 cm/s                            SHUNTS                            Systemic VTI:  0.18 m                            Systemic Diam: 2.10 cm Rozann Lesches MD Electronically signed by Rozann Lesches MD Signature Date/Time: 12/15/2019/12:45:22 PM    Final     Assessment/Plan:  Pleasant 86 y/o male with multiple  comorbidities as outlined above. Recent multifocal PNA, new pericardial effusion, new ascites along with electrolyte abnormalities and edema.  Acute on chronic anemia in the setting of chronic renal disease. Hgb had been in low 11 range but dropped to low 9 recently. No overt GI bleeding. His iron/tibc/ferritin most c/w anemia of chronic disease. Recently elevated ferritin in setting of infection, was normal in 08/2019. Folate and B12 unremarkable. Would continue to monitor for overt gi bleeding. Would consider repeating ferritin along with CBC in four weeks.   Abnormal duodenum with wall thickening and adjacent fat stranding. May require further evaluation with EGD. Of note, patient had H.pylori on bx in 2019 but has not been treated. To discuss possible EGD with Dr. Gala Romney.   Small amount of ascites and upper limits of normal size liver in the setting of diastolic CHF and peripheral edema. New pericardial effusion and worsening of renal failure with recent admission. Cannot exclude hepatic congestion in this setting. Of note, his AST/ALT were mildly elevated during recent admission but now normal. I don't believe ascites is due to chronic liver disease. Not enough ascites for paracentesis.   Constipation, worse with fluid restriction. Trial of low dose Linzess 51mcg daily. No need for renal dose adjustment.   Further recommendations to follow.

## 2019-12-19 NOTE — Patient Instructions (Signed)
1. Start Linzess 72 mcg daily before breakfast for constipation. I have sent you home with samples but have also sent prescription to the pharmacy. I cannot tell if it is on your formulary so if it is expensive, don't buy it and call us so we can find an alternative.  2. I will be in touch regarding possible upper endoscopy once I discuss findings with Dr. Gala Romney.  3. It was a pleasure meeting you today!

## 2019-12-20 ENCOUNTER — Telehealth: Payer: Self-pay | Admitting: Cardiovascular Disease

## 2019-12-20 ENCOUNTER — Ambulatory Visit (INDEPENDENT_AMBULATORY_CARE_PROVIDER_SITE_OTHER): Payer: Medicare Other | Admitting: Family Medicine

## 2019-12-20 ENCOUNTER — Encounter: Payer: Self-pay | Admitting: Family Medicine

## 2019-12-20 ENCOUNTER — Other Ambulatory Visit: Payer: Self-pay

## 2019-12-20 VITALS — BP 122/76 | Temp 97.1°F | Wt 227.8 lb

## 2019-12-20 DIAGNOSIS — N289 Disorder of kidney and ureter, unspecified: Secondary | ICD-10-CM

## 2019-12-20 DIAGNOSIS — N1831 Chronic kidney disease, stage 3a: Secondary | ICD-10-CM

## 2019-12-20 DIAGNOSIS — E1169 Type 2 diabetes mellitus with other specified complication: Secondary | ICD-10-CM

## 2019-12-20 DIAGNOSIS — E785 Hyperlipidemia, unspecified: Secondary | ICD-10-CM

## 2019-12-20 DIAGNOSIS — I10 Essential (primary) hypertension: Secondary | ICD-10-CM | POA: Diagnosis not present

## 2019-12-20 DIAGNOSIS — I313 Pericardial effusion (noninflammatory): Secondary | ICD-10-CM | POA: Diagnosis not present

## 2019-12-20 DIAGNOSIS — Z794 Long term (current) use of insulin: Secondary | ICD-10-CM

## 2019-12-20 DIAGNOSIS — E1121 Type 2 diabetes mellitus with diabetic nephropathy: Secondary | ICD-10-CM

## 2019-12-20 DIAGNOSIS — I3139 Other pericardial effusion (noninflammatory): Secondary | ICD-10-CM

## 2019-12-20 MED ORDER — SITAGLIPTIN PHOSPHATE 100 MG PO TABS: ORAL_TABLET | ORAL | 5 refills | Status: DC

## 2019-12-20 MED ORDER — FAMOTIDINE 20 MG PO TABS: ORAL_TABLET | ORAL | 1 refills | Status: DC

## 2019-12-20 NOTE — Telephone Encounter (Signed)
  Patient Consent for Virtual Visit         Jerry Burns has provided verbal consent on 12/20/2019 for a virtual visit (video or telephone).   CONSENT FOR VIRTUAL VISIT FOR:  Jerry Burns  By participating in this virtual visit I agree to the following:  I hereby voluntarily request, consent and authorize [facility] and its employed or contracted physicians, physician assistants, nurse practitioners or other licensed health care professionals (the Practitioner), to provide me with telemedicine health care services (the "Services") as deemed necessary by the treating Practitioner. I acknowledge and consent to receive the Services by the Practitioner via telemedicine. I understand that the telemedicine visit will involve communicating with the Practitioner through live audiovisual communication technology and the disclosure of certain medical information by electronic transmission. I acknowledge that I have been given the opportunity to request an in-person assessment or other available alternative prior to the telemedicine visit and am voluntarily participating in the telemedicine visit.  I understand that I have the right to withhold or withdraw my consent to the use of telemedicine in the course of my care at any time, without affecting my right to future care or treatment, and that the Practitioner or I may terminate the telemedicine visit at any time. I understand that I have the right to inspect all information obtained and/or recorded in the course of the telemedicine visit and may receive copies of available information for a reasonable fee.  I understand that some of the potential risks of receiving the Services via telemedicine include:  Marland Kitchen Delay or interruption in medical evaluation due to technological equipment failure or disruption; . Information transmitted may not be sufficient (e.g. poor resolution of images) to allow for appropriate medical decision making by the  Practitioner; and/or  . In rare instances, security protocols could fail, causing a breach of personal health information.  Furthermore, I acknowledge that it is my responsibility to provide information about my medical history, conditions and care that is complete and accurate to the best of my ability. I acknowledge that Practitioner's advice, recommendations, and/or decision may be based on factors not within their control, such as incomplete or inaccurate data provided by me or distortions of diagnostic images or specimens that may result from electronic transmissions. I understand that the practice of medicine is not an exact science and that Practitioner makes no warranties or guarantees regarding treatment outcomes. I acknowledge that a copy of this consent can be made available to me via my patient portal ([facility]), or I can request a printed copy by calling the office of [facility].    I understand that my insurance will be billed for this visit.   I have read or had this consent read to me. . I understand the contents of this consent, which adequately explains the benefits and risks of the Services being provided via telemedicine.  . I have been provided ample opportunity to ask questions regarding this consent and the Services and have had my questions answered to my satisfaction. . I give my informed consent for the services to be provided through the use of telemedicine in my medical care

## 2019-12-20 NOTE — Progress Notes (Signed)
Subjective:    Patient ID: Jerry Burns, male    DOB: 1934/03/12, 86 y.o.   MRN: [id number]  HPI Pt here today for follow up. Pt seen in office on 12/04/19 for HTN. Has had lab work completed.  Pt has been eating [employer], soft boiled eggs, grits, baked chicken, cauliflower, chicken salad, pizza, steak but states that none of the food taste good. Pt states that he is almost angry, not at anybody but because he doesn't like to lose.  1- Ambulatory referral to diabetic education  Essential hypertension  Hyperlipidemia associated with type 2 diabetes mellitus (HCC)  Pericardial effusion  Type 2 diabetes mellitus with stage 3a chronic kidney disease, with long-term current use of insulin ([place]) - Plan: Ambulatory referral to diabetic education  Renal insufficiency - Plan: Ambulatory referral to diabetic education  Patient relates he has noticed increased swelling in the lower legs.  He was recently in the hospital on Lasix but at that time he also had a very low sodium.  He is trying to restrict his fluids.  He was seen by nephrology in the hospital but they have not connected with him regarding a follow-up visit and we have sent referrals to that regarding this. Patient had recent chest x-ray that looked good Recent echo that showed pericardial effusion - Ambulatory referral to diabetic education  Review of Systems Significant shortness of breath with activity low energy fatigue also some low sugar spells    Objective:   Physical Exam Lungs clear respiratory rate normal heart rate is controlled abdomen is soft no masses or tenderness extremities significant edema in the lower legs from the knee down to the feet       Assessment & Plan:  1. Essential hypertension Blood pressure good control continue current measures.  Minimize salt in diet minimize needs  2. Hyperlipidemia associated with type 2 diabetes mellitus ([place]) Labs recent under good control continue current  measures we did adjust his insulin I did discuss with him in detail the purpose of short acting insulin long-acting insulin They are looking into a continuous monitor to see if they can afford it They will reduce the long-acting insulin to just 20 units at night They will notify us of any low sugars Referral to diabetic nutritionist renal diet recommended as well as diabetic diet  3. Pericardial effusion Significant pericardial effusion patient gets short of breath with minimal activity he was in the hospital with pneumonia but this is resolved and his lungs sound clear this is concerning for the possibility of the pericardial effusion causing some cardiac dysfunction Will be seeing cardiology tomorrow for their input and opinion most appreciated  4. Type 2 diabetes mellitus with stage 3a chronic kidney disease, with long-term current use of insulin ([place]) Diabetes we did adjust the medication I am concerned about his chronic kidney disease he also has significant swelling in the lower legs We went ahead and added increased dose of torsemide 20 mg each morning Recheck again in 10 days It is rather disconcerting that nephrology has not been able to work him it at this point we will reach out to nephrology talk with him to try to encourage them to work him in sooner (As a matter of fact they have not even signed an appointment date) - Ambulatory referral to diabetic education  5. Renal insufficiency Please see discussion above Although his creatinine is not in the range of dialysis it would certainly be appreciated to have a nephrology  consult - Ambulatory referral to diabetic education Phone call was placed to [facility] to see if they can get him in sooner-they have not even assigned an appointment even though referral was several weeks ago  Patient was on torsemide 10 mg increase the dose to 20 mg

## 2019-12-21 ENCOUNTER — Encounter: Payer: Self-pay | Admitting: Cardiovascular Disease

## 2019-12-21 ENCOUNTER — Other Ambulatory Visit: Payer: Self-pay

## 2019-12-21 ENCOUNTER — Telehealth (INDEPENDENT_AMBULATORY_CARE_PROVIDER_SITE_OTHER): Payer: Medicare Other | Admitting: Cardiovascular Disease

## 2019-12-21 VITALS — BP 132/76 | Ht 72.0 in | Wt 227.0 lb

## 2019-12-21 DIAGNOSIS — R0602 Shortness of breath: Secondary | ICD-10-CM | POA: Diagnosis not present

## 2019-12-21 DIAGNOSIS — Z955 Presence of coronary angioplasty implant and graft: Secondary | ICD-10-CM | POA: Diagnosis not present

## 2019-12-21 DIAGNOSIS — R600 Localized edema: Secondary | ICD-10-CM

## 2019-12-21 DIAGNOSIS — N183 Chronic kidney disease, stage 3 unspecified: Secondary | ICD-10-CM

## 2019-12-21 DIAGNOSIS — E785 Hyperlipidemia, unspecified: Secondary | ICD-10-CM | POA: Diagnosis not present

## 2019-12-21 DIAGNOSIS — I3139 Other pericardial effusion (noninflammatory): Secondary | ICD-10-CM

## 2019-12-21 DIAGNOSIS — I25118 Atherosclerotic heart disease of native coronary artery with other forms of angina pectoris: Secondary | ICD-10-CM

## 2019-12-21 DIAGNOSIS — I10 Essential (primary) hypertension: Secondary | ICD-10-CM | POA: Diagnosis not present

## 2019-12-21 DIAGNOSIS — I5031 Acute diastolic (congestive) heart failure: Secondary | ICD-10-CM | POA: Diagnosis not present

## 2019-12-21 DIAGNOSIS — I313 Pericardial effusion (noninflammatory): Secondary | ICD-10-CM

## 2019-12-21 MED ORDER — TORSEMIDE 20 MG PO TABS
20.0000 mg | ORAL_TABLET | Freq: Two times a day (BID) | ORAL | 3 refills | Status: DC
Start: 2019-12-21 — End: 2019-12-29

## 2019-12-21 MED ORDER — METOLAZONE 2.5 MG PO TABS
ORAL_TABLET | ORAL | 0 refills | Status: DC
Start: 2019-12-21 — End: 2019-12-29

## 2019-12-21 NOTE — Addendum Note (Signed)
Addended by: Barbarann Ehlers A on: 12/21/2019 10:01 AM   Modules accepted: Orders

## 2019-12-21 NOTE — Progress Notes (Signed)
Contacted [facility]. The next available appt is July 6th at 2pm.(I did go ahead and have them put patient on schedule to hold that appointment) Spoke with Matthew Folks states that providers there have reviewed patients chart and they do not feel that patient need to be seen ASAP. Hinton Dyer states that if provider feels patient needs to be seen sooner, provider can call [facility] and speak with a provider. Please advise. Thank you

## 2019-12-21 NOTE — Progress Notes (Signed)
Virtual Visit via Telephone Note   This visit type was conducted due to national recommendations for restrictions regarding the COVID-19 Pandemic (e.g. social distancing) in an effort to limit this patient's exposure and mitigate transmission in our community.  Due to his co-morbid illnesses, this patient is at least at moderate risk for complications without adequate follow up.  This format is felt to be most appropriate for this patient at this time.  The patient did not have access to video technology/had technical difficulties with video requiring transitioning to audio format only (telephone).  All issues noted in this document were discussed and addressed.  No physical exam could be performed with this format.  Please refer to the patient's chart for his  consent to telehealth for [facility].   The patient was identified using 2 identifiers.  Date:  12/21/2019   ID:  Jerry Burns, DOB October 10, 1933, MRN [id number]  Patient Location: Home Provider Location: Home  PCP:  Kathyrn Drown, MD  Cardiologist:  Kate Sable, MD  Electrophysiologist:  None   Evaluation Performed:  Follow-Up Visit  Chief Complaint: Shortness of breath, leg swelling  History of Present Illness:    Jerry Burns is a 86 y.o. male with shortness of breath and bilateral leg edema.  I received a message from his PCP yesterday regarding his symptoms and concern regarding pericardial effusion.  He increase his torsemide from 10 to 20 mg daily.  Past medical history includes coronary artery disease with drug-eluting stent placement to the proximal LAD and balloon angioplasty to the distal LAD in 2016.  Stent was found to be patent by repeat cardiac catheterization in July 2018 with mild residual coronary artery disease.  He was recently hospitalized and discharged on 12/02/2019 after being treated for hypervolemic hyponatremia along with acute kidney injury superimposed on chronic kidney disease stage  IIIa.  This is felt secondary to contrast-induced nephropathy in the presence of Metformin.  Baseline creatinine noted to be around 1.3.  He said he "feels a little better because his attitude has improved".  He gets out of breath when lifting heavy objects or when walking 20 feet.  He said he "hates fluid restriction" saying it's a big adjustment. He did say "I'm optimistic".  He denies chest pain.   Soc Hx: He and his wife have 3 children in [place], one in [place], and one in [place].  Past Medical History:  Diagnosis Date  . Anginal pain ([place])   . Arthritis    Bilateral knee  . CHF (congestive heart failure) ([place])   . Coronary artery disease   . Diabetes mellitus    insulin dependent   . DVT, lower extremity, distal, acute ([place]) 09/09/2018  . Hyperlipidemia   . Hypertension   . Progressive angina ([place]) 03/2015   a. s/p DES to proximal LAD and balloon angioplasty to distal LAD in 2016 b. patent stent by repeat cath in 01/2017 with mild residual CAD)  . Stroke [facility])    Past Surgical History:  Procedure Laterality Date  . APPENDECTOMY    . CARDIAC CATHETERIZATION N/A 04/12/2015   Procedure: Left Heart Cath and Coronary Angiography;  Surgeon: Sherren Mocha, MD; pLAD 75>0% w/ 3.0x12 mm Resolute DES, dLAD 80>0% w/ Angiosculpt scoring balloon, CFX luminal irreg, RCA mild dz, EF 55-65%   . COLONOSCOPY  2011   RMR: 1. Normal rectum 2. Left sided diverticula , 2 ascending colon polyps, status post snare polypectomy. Remainder of colonic mucosa appeared unremarkable.   Marland Kitchen  COLONOSCOPY N/A 05/13/2016   Procedure: COLONOSCOPY;  Surgeon: Daneil Dolin, MD;  Location: [facility];  Service: Endoscopy;  Laterality: N/A;  7:30 AM  . COLONOSCOPY N/A 05/18/2018   Rourk: Diverticulosis, single tubular adenoma removed.  No plans for future screening or surveillance colonoscopy due to age.  . CORONARY ANGIOGRAPHY N/A 02/04/2017   Procedure: CORONARY ANGIOGRAPHY (CATH LAB);  Surgeon: Troy Sine, MD;  Location: [facility] CV LAB;  Service: Cardiovascular;  Laterality: N/A;  . CORONARY ANGIOPLASTY WITH STENT PLACEMENT  08/22/2014   PTCA/DES X mLAD with 2.5 x 16 mm Promus Premier DES by Dr Julianne Handler  . ESOPHAGOGASTRODUODENOSCOPY N/A 05/18/2018   [place]k: Ulcerative reflux esophagitis, hiatal hernia, erosive gastropathy with H. pylori on biopsy, also in the setting of NSAID use.  Marland Kitchen FOOT SURGERY Right   . LEFT HEART CATHETERIZATION WITH CORONARY ANGIOGRAM N/A 08/22/2014   Procedure: LEFT HEART CATHETERIZATION WITH CORONARY ANGIOGRAM;  Surgeon: Burnell Blanks, MD;  [facility] CATH LAB;  Service: Cardiovascular;  Laterality: N/A;  . PERCUTANEOUS CORONARY STENT INTERVENTION (PCI-S)  08/22/2014   Procedure: PERCUTANEOUS CORONARY STENT INTERVENTION (PCI-S);  Surgeon: Burnell Blanks, MD;   . SHOULDER SURGERY    . TONSILLECTOMY       Current Meds  Medication Sig  . amLODipine (NORVASC) 5 MG tablet TAKE (1) TABLET BY MOUTH ONCE DAILY. (Patient taking differently: Take 5 mg by mouth daily. TAKE (1) TABLET BY MOUTH ONCE DAILY.)  . Ascorbic Acid (VITAMIN C) 1000 MG tablet Take 500 mg by mouth in the morning and at bedtime.   Marland Kitchen aspirin EC 81 MG tablet Take 81 mg by mouth daily.  Marland Kitchen atorvastatin (LIPITOR) 80 MG tablet TAKE (1) TABLET BY MOUTH ONCE DAILY. (Patient taking differently: Take 80 mg by mouth daily. TAKE (1) TABLET BY MOUTH ONCE DAILY.)  . carvedilol (COREG) 12.5 MG tablet TAKE (1) TABLET BY MOUTH TWICE DAILY. (Patient taking differently: Take 12.5 mg by mouth in the morning and at bedtime. TAKE (1) TABLET BY MOUTH TWICE DAILY.)  . COMBIGAN 0.2-0.5 % ophthalmic solution Place 1 drop into the left eye 2 (two) times daily.   . Cyanocobalamin (B-12) 2500 MCG TABS Take 1 tablet by mouth 3 (three) times a week. Three times a week on Monday Wednesday and Friday   . dutasteride (AVODART) 0.5 MG capsule TAKE ONE CAPSULE BY MOUTH ONCE DAILY. (Patient taking differently: Take  0.5 mg by mouth daily. )  . Emollient (CERAVE) CREA Apply 1 application topically daily at 6 (six) AM.   . erythromycin ophthalmic ointment Place 1 application into the left eye 3 (three) times daily as needed (infection).   . famotidine (PEPCID) 20 MG tablet Take one tablet po daily  . Flaxseed, Linseed, (FLAXSEED OIL PO) Take 1 capsule by mouth daily.   Marland Kitchen glucose blood (CONTOUR NEXT TEST) test strip USE 1 STRIP TO CHECK GLUCOSE THREE TIMES DAILY. FOR ICD 10 E11.9  . insulin lispro (HUMALOG KWIKPEN) 100 UNIT/ML KwikPen Inject twice daily per following: Breakfast 2 units; if 150-250 then 3 units; if greater than 250 5 units. Supper-3 units but if 150-250 do 4 units; if greater that 250 6 units (Patient taking differently: Inject 2-4 Units into the skin See admin instructions. Inject 2 units at breakfast and 4 units at supper daily)  . Lactobacillus (PROBIOTIC ACIDOPHILUS) CAPS Take 1 capsule by mouth daily at 6 (six) AM.  . LANTUS SOLOSTAR 100 UNIT/ML Solostar Pen INJECT 22 UNITS SUBCUTANEOUSLY ONCE DAILY  AT 10PM (Patient taking differently: Inject 22 Units into the skin every evening. )  . linaclotide (LINZESS) 72 MCG capsule Take 1 capsule (72 mcg total) by mouth daily before breakfast.  . mometasone (ELOCON) 0.1 % cream APPLY TO AFFECTED AREAS ONCE DAILY. (Patient taking differently: Apply 1 application topically daily as needed. )  . Multiple Vitamin (MULTIVITAMIN) tablet Take 1 tablet by mouth daily.  . nitroGLYCERIN (NITROSTAT) 0.4 MG SL tablet PLACE 1 TAB UNDER TONGUE EVERY 5 MIN IF NEEDED FOR CHEST PAIN. MAY USE 3 TIMES.NO RELIEF CALL 911. (Patient taking differently: Place 0.4 mg under the tongue every 5 (five) minutes as needed. PLACE 1 TAB UNDER TONGUE EVERY 5 MIN IF NEEDED FOR CHEST PAIN. MAY USE 3 TIMES.NO RELIEF CALL 911.)  . Polyvinyl Alcohol-Povidone (REFRESH OP) Place 1 drop into both ears 2 (two) times daily as needed (dryness).   . sitaGLIPtin (JANUVIA) 100 MG tablet TAKE (1) TABLET  BY MOUTH ONCE DAILY.  . tamsulosin (FLOMAX) 0.4 MG CAPS capsule TAKE (1) CAPSULE BY MOUTH ONCE DAILY. (Patient taking differently: Take 0.4 mg by mouth daily. )  . torsemide (DEMADEX) 20 MG tablet 1/2 tablet every morning  . Turmeric 500 MG CAPS Take 500 mg by mouth daily.     Allergies:   Adhesive [tape], Sulfa antibiotics, and Latex   Social History   Tobacco Use  . Smoking status: Former Smoker    Packs/day: 1.00    Years: 20.00    Pack years: 20.00    Types: Cigarettes, Cigars    Start date: 08/10/1955    Quit date: 07/28/2007    Years since quitting: 12.4  . Smokeless tobacco: Never Used  Substance Use Topics  . Alcohol use: Not Currently    Alcohol/week: 0.0 standard drinks    Comment: Daily about 1 glass of wine  . Drug use: No     Family Hx: The patient's family history includes Colon cancer in his maternal uncle; Diabetes in his brother, father, and another family member; Hypertension in his sister; Skin cancer in his mother and sister.  ROS:   Please see the history of present illness.     All other systems reviewed and are negative.   Prior CV studies:   The following studies were reviewed today:  Echocardiogram 12/15/19:  1. Left ventricular ejection fraction, by estimation, is 55%. The left  ventricle has normal function. The left ventricle demonstrates regional  wall motion abnormalities (see scoring diagram/findings for description).  Left ventricular diastolic  parameters are indeterminate.  2. Right ventricular systolic function is normal. The right ventricular  size is normal. There is mildly elevated pulmonary artery systolic  pressure. The estimated right ventricular systolic pressure is 17.7 mmHg.  3. Left atrial size was moderately dilated.  4. Moderate pericardial effusion. The pericardial effusion is  circumferential with anterior predominance. There is evidence of  organization and potential chronicity. No clear right ventricular    compromise or diagnostic variation in mitral outflow to  suggest tamponade physiology.  5. The mitral valve is grossly normal. Mild mitral valve regurgitation.  6. The aortic valve is tricuspid. Aortic valve regurgitation is not  visualized.  7. The inferior vena cava is dilated in size with <50% respiratory  variability, suggesting right atrial pressure of 15 mmHg.   NST: 12/2016  There was no ST segment deviation noted during stress.  Findings consistent with prior inferior/inferoseptal/inferoapical myocardial infarction with moderate peri-infarct ischemia.  This is an intermediate risk study.  The  left ventricular ejection fraction is normal (55-65%).  Cardiac Catheterization: 01/2017  Prox RCA lesion, 15 %stenosed.  Dist RCA lesion, 20 %stenosed.  1st Diag lesion, 20 %stenosed.  Ost LAD-1 lesion, 40 %stenosed.  Ost LAD-2 lesion, 0 %stenosed.  Dist LAD-1 lesion, 30 %stenosed.  Dist LAD-2 lesion, 15 %stenosed.  Mild residual CAD with a patent proximal LAD stent and 30-40% narrowing immediately proximal to the stented segment, 20% proximal diagonal stenosis, 30% mid LAD stenosis, patent distal LAD stent with 15% intimal hyperplasia; normal left circumflex system; mild 20% proximal and distal stenoses in a dominant RCA.  RECOMMENDATION: Medical therapy. Recommend echo Doppler study for LV function assessment since despite prolonged attempt at crossing the aortic valve with a catheter due to the significant tortuosity and left ventriculography was not able to be performed during this procedure.  Labs/Other Tests and Data Reviewed:    EKG:  No ECG reviewed.  Recent Labs: 11/30/2019: B Natriuretic Peptide 119.0; TSH 4.149 12/11/2019: ALT 24; Hemoglobin 10.2; Magnesium 2.2; Platelets 264 12/13/2019: BUN 35; Creatinine, Ser 1.54; Potassium 5.2; Sodium 142   Recent Lipid Panel Lab Results  Component Value Date/Time   CHOL 154 07/27/2019 07:42 AM   TRIG 103  07/27/2019 07:42 AM   HDL 53 07/27/2019 07:42 AM   CHOLHDL 2.9 07/27/2019 07:42 AM   CHOLHDL 2.9 07/31/2014 07:03 AM   LDLCALC 82 07/27/2019 07:42 AM    Wt Readings from Last 3 Encounters:  12/21/19 227 lb (103 kg)  12/20/19 227 lb 12.8 oz (103.3 kg)  12/19/19 228 lb 6.4 oz (103.6 kg)     Objective:    Vital Signs:  BP 132/76   Ht 6' (1.829 m)   Wt 227 lb (103 kg)   BMI 30.79 kg/m    VITAL SIGNS:  reviewed  ASSESSMENT & PLAN:    1.  Coronary artery disease: Symptomatically stable.  Continue ASA, carvedilol and atorvastatin.   2.  Hypertension: Normal blood pressure today.  No changes to therapy.  3.  Hyperlipidemia: Continue atorvastatin 80 mg. Lipids reviewed above.  4. Shortness of breath/acute diastolic heart failure: Torsemide increased to 20 mg daily by PCP on 12/20/19. See discussion in #5 as well. Wt 211 lbs on 03/28/19, now up to 227 lbs. BUN 35, creatinine 1.54 on 12/13/2019.  I will increase torsemide to 20 mg twice daily and provide metolazone 2.5 mg today and tomorrow.  I will check a basic metabolic panel early next week.  5. Pericardial effusion: Moderate in size by echo on 12/15/19 with no evidence of tamponade, and thus should not be contributing to symptoms of shortness of breath (also appears chronic). I will obtain a limited echo next week for reassessment after increasing diuretics as I suspect this is due to hypervolemia.  6. Bilateral leg edema: Torsemide dose increased from 10 to 20 mg daily by PCP on 12/20/2019.  See discussion in #4.  7.  Chronic kidney disease stage III: He was recently hospitalized and discharged on 12/02/2019 after being treated for hypervolemic hyponatremia along with acute kidney injury superimposed on chronic kidney disease stage IIIa.  This is felt secondary to contrast-induced nephropathy in the presence of Metformin.  Baseline creatinine noted to be around 1.3. BUN 35, creatinine 1.54 on 12/13/2019.  As I am increasing diuretics, I  will repeat a basic metabolic panel early next week.     COVID-19 Education: The signs and symptoms of COVID-19 were discussed with the patient and how to seek care for testing (  follow up with PCP or arrange E-visit).  The importance of social distancing was discussed today.  Time:   Today, I have spent 40 minutes with the patient with telehealth technology discussing the above problems.     Medication Adjustments/Labs and Tests Ordered: Current medicines are reviewed at length with the patient today.  Concerns regarding medicines are outlined above.   Tests Ordered: No orders of the defined types were placed in this encounter.   Medication Changes: No orders of the defined types were placed in this encounter.   Follow Up:  In Person in 1 week(s) with APP  Signed, Kate Sable, MD  12/21/2019 8:50 AM    [place]

## 2019-12-21 NOTE — Patient Instructions (Addendum)
Medication Instructions:  INCREASE Torsemide to 20 mg twice a day  Take Metolazone 2.5 mg today and tomorrow  *If you need a refill on your cardiac medications before your next appointment, please call your pharmacy*   Lab Work: BMET on Tuesday 6/1  If you have labs (blood work) drawn today and your tests are completely normal, you will receive your results only by: Marland Kitchen MyChart Message (if you have MyChart) OR . A paper copy in the mail If you have any lab test that is abnormal or we need to change your treatment, we will call you to review the results.   Testing/Procedures: Your physician has requested that you have a limited echocardiogram on Wednesday 12/27/19. Echocardiography is a painless test that uses sound waves to create images of your heart. It provides your doctor with information about the size and shape of your heart and how well your heart's chambers and valves are working. This procedure takes approximately one hour. There are no restrictions for this procedure.     Follow-Up: At [facility], you and your health needs are our priority.  As part of our continuing mission to provide you with exceptional heart care, we have created designated Provider Care Teams.  These Care Teams include your primary Cardiologist (physician) and Advanced Practice Providers (APPs -  Physician Assistants and Nurse Practitioners) who all work together to provide you with the care you need, when you need it.  We recommend signing up for the patient portal called "MyChart".  Sign up information is provided on this After Visit Summary.  MyChart is used to connect with patients for Virtual Visits (Telemedicine).  Patients are able to view lab/test results, encounter notes, upcoming appointments, etc.  Non-urgent messages can be sent to your provider as well.   To learn more about what you can do with MyChart, go to [email or web address].    Your next appointment:  NEXT WEEK with Katina Dung, NP at  the [facility] office ,[phone] on Friday June 4 th at 2 pm

## 2019-12-22 NOTE — Progress Notes (Signed)
Pt contacted and informed of appointment. Pt very appreciative.

## 2019-12-26 ENCOUNTER — Other Ambulatory Visit: Payer: Self-pay

## 2019-12-26 ENCOUNTER — Other Ambulatory Visit (HOSPITAL_COMMUNITY)
Admission: RE | Admit: 2019-12-26 | Discharge: 2019-12-26 | Disposition: A | Discharge status: Home / Self Care | Payer: Medicare Other | Source: Ambulatory Visit | Attending: Cardiovascular Disease | Admitting: Cardiovascular Disease

## 2019-12-26 DIAGNOSIS — I5031 Acute diastolic (congestive) heart failure: Secondary | ICD-10-CM | POA: Insufficient documentation

## 2019-12-26 DIAGNOSIS — N183 Chronic kidney disease, stage 3 unspecified: Secondary | ICD-10-CM | POA: Diagnosis not present

## 2019-12-26 LAB — BASIC METABOLIC PANEL
Anion gap: 12 (ref 5–15)
BUN: 56 mg/dL — ABNORMAL HIGH (ref 8–23)
CO2: 32 mmol/L (ref 22–32)
Calcium: 9 mg/dL (ref 8.9–10.3)
Chloride: 96 mmol/L — ABNORMAL LOW (ref 98–111)
Creatinine, Ser: 2.08 mg/dL — ABNORMAL HIGH (ref 0.61–1.24)
GFR calc Af Amer: 33 mL/min — ABNORMAL LOW (ref >60)
GFR calc non Af Amer: 28 mL/min — ABNORMAL LOW (ref >60)
Glucose, Bld: 94 mg/dL (ref 70–99)
Potassium: 3.5 mmol/L (ref 3.5–5.1)
Sodium: 140 mmol/L (ref 135–145)

## 2019-12-27 ENCOUNTER — Other Ambulatory Visit: Payer: Self-pay

## 2019-12-27 ENCOUNTER — Ambulatory Visit (HOSPITAL_COMMUNITY)
Admission: RE | Admit: 2019-12-27 | Discharge: 2019-12-27 | Disposition: A | Discharge status: Home / Self Care | Payer: Medicare Other | Source: Ambulatory Visit | Attending: Cardiovascular Disease | Admitting: Cardiovascular Disease

## 2019-12-27 DIAGNOSIS — I3139 Other pericardial effusion (noninflammatory): Secondary | ICD-10-CM

## 2019-12-27 DIAGNOSIS — I313 Pericardial effusion (noninflammatory): Secondary | ICD-10-CM

## 2019-12-27 NOTE — Progress Notes (Signed)
*  PRELIMINARY RESULTS* Echocardiogram LImited 2-D Echocardiogram has been performed.  Samuel Germany 12/27/2019, 11:17 AM

## 2019-12-29 ENCOUNTER — Ambulatory Visit (INDEPENDENT_AMBULATORY_CARE_PROVIDER_SITE_OTHER): Payer: Medicare Other | Admitting: Family Medicine

## 2019-12-29 ENCOUNTER — Encounter: Payer: Self-pay | Admitting: Family Medicine

## 2019-12-29 ENCOUNTER — Telehealth: Payer: Self-pay

## 2019-12-29 ENCOUNTER — Other Ambulatory Visit: Payer: Self-pay

## 2019-12-29 VITALS — BP 118/70 | Temp 97.0°F | Ht 72.0 in | Wt 215.0 lb

## 2019-12-29 VITALS — BP 136/74 | HR 81 | Ht 71.0 in | Wt 214.8 lb

## 2019-12-29 DIAGNOSIS — I10 Essential (primary) hypertension: Secondary | ICD-10-CM

## 2019-12-29 DIAGNOSIS — N183 Chronic kidney disease, stage 3 unspecified: Secondary | ICD-10-CM | POA: Diagnosis not present

## 2019-12-29 DIAGNOSIS — I2510 Atherosclerotic heart disease of native coronary artery without angina pectoris: Secondary | ICD-10-CM

## 2019-12-29 DIAGNOSIS — I313 Pericardial effusion (noninflammatory): Secondary | ICD-10-CM

## 2019-12-29 DIAGNOSIS — R300 Dysuria: Secondary | ICD-10-CM

## 2019-12-29 DIAGNOSIS — I3139 Other pericardial effusion (noninflammatory): Secondary | ICD-10-CM

## 2019-12-29 DIAGNOSIS — Z79899 Other long term (current) drug therapy: Secondary | ICD-10-CM | POA: Diagnosis not present

## 2019-12-29 DIAGNOSIS — I5032 Chronic diastolic (congestive) heart failure: Secondary | ICD-10-CM | POA: Diagnosis not present

## 2019-12-29 DIAGNOSIS — R600 Localized edema: Secondary | ICD-10-CM

## 2019-12-29 LAB — POCT URINALYSIS DIPSTICK
Spec Grav, UA: 1.02 (ref 1.010–1.025)
pH, UA: 5 (ref 5.0–8.0)

## 2019-12-29 MED ORDER — TORSEMIDE 20 MG PO TABS: 20.0000 mg | ORAL_TABLET | Freq: Every day | ORAL | Status: DC

## 2019-12-29 NOTE — Telephone Encounter (Signed)
Pt confirmed his meds as correct,mailed lab slip

## 2019-12-29 NOTE — Progress Notes (Addendum)
   Subjective:    Patient ID: Jerry Burns, male    DOB: 1934/04/18, 85 y.o.   MRN: [id number]  HPIFollow up. A1C scheduled for July 1st.   Has lost 13 plus pounds during the last 7 days.  Patient did have severe swelling in his lower legs this is improving Had a repeat met 7 which showed elevated BUN/creatinine Energy level better but not really good.  Breathing is better but not really good.  Patient denies any chest tightness pressure pain but does relate gets out of breath with activity.  Fall Risk  12/29/2019 05/01/2019 12/24/2016 08/27/2016 12/17/2015  Falls in the past year? 1 0 No No No  Number falls in past yr: 0 - - - -  Injury with Fall? 1 - - - -  Risk for fall due to : - - - - -  Risk for fall due to: Comment - - - - -  Follow up Education provided;Falls evaluation completed Falls evaluation completed - - -     Review of Systems  Constitutional: Negative for activity change.  HENT: Negative for congestion and rhinorrhea.   Respiratory: Positive for shortness of breath. Negative for cough.   Cardiovascular: Positive for leg swelling. Negative for chest pain.  Gastrointestinal: Negative for abdominal pain, diarrhea, nausea and vomiting.  Genitourinary: Negative for dysuria and hematuria.  Neurological: Negative for weakness and headaches.  Psychiatric/Behavioral: Negative for behavioral problems and confusion.       Objective:   Physical Exam Lungs clear respiratory normal heart regular extremities 1-2+ pitting edema of the lower legs  Urinalysis 1.020 specific gravity negative ketones     Assessment & Plan:  Significant pedal edema Renal insufficiency, chronic kidney disease, BUN and creatinine have gone up on the current regimen You will be seeing cardiology today Hopefully can reduce torsemide they should discuss this with him when he comes in  Patient has appointment with nephrology in early July patient will follow up with [place] in July for diabetic  checkup  We will repeat the met 7 in 2 weeks time

## 2019-12-29 NOTE — Progress Notes (Signed)
Cardiology Office Note  Date: 12/29/2019   ID: Jerry Burns, DOB [birth year], MRN [id number]  PCP:  Kathyrn Drown, MD  Cardiologist:  Kate Sable, MD Electrophysiologist:  None   Chief Complaint: Follow-up shortness of breath and leg swelling  History of Present Illness: Jerry Burns is a 86 y.o. male with a history of history of CAD with DES to proximal LAD and balloon angioplasty to distal LAD 2016.  Patent stent on cardiac catheterization July 2018 with mild residual CAD.  Recent hospitalization and discharge on 2019-12-02 after being treated for hypervolemic hyponatremia along with acute kidney injury superimposed on chronic kidney disease stage IIIa.  This was felt secondary to contrast-induced nephropathy in the presence of Metformin.  Baseline creatinine around 1.3.  Becomes short of breath when lifting heavy objects or walking 20 feet.  Dislikes fluid restrictions.  Saw Dr. Bronson Ing on 2019-12-21 via telemedicine visit.  His CAD was symptomatically stable and he was continuing with aspirin carvedilol and atorvastatin.  Blood pressure was normal.  His torsemide has been increased to 20 mg daily by PCP on 2019-12-20.  His weight had increased from 211 pounds on 2019-04-16 up to 227 pounds on 2019-12-21.  Creatinine was 1.54 and BUN was 35 on 2019-12-13.  Torsemide was increased to 20 mg twice a daily and was given 2 doses of metolazone 2.5 mg for 2 days only then recheck of BMP.  Pericardial effusion moderate in size by echo on 2019-12-15 with no evidence of tamponade.  Dr. Bronson Ing ordered a limited echo.  Patient states he has been doing well overall but does not like the fluid restrictions.  States he has been drinking five 8 ounce glasses of fluids per day.  He he states he has been feeling thirsty the last couple of days and his urine appears to be more concentrated.  He states he is not happy with the fluid restrictions per se.  He denies any anginal or exertional  symptoms, palpitations or arrhythmias, orthostatic symptoms, CVA or TIA-like symptoms, PND, orthopnea.  Continues to have some mild lower extremity pitting edema.  His weight is down to 214 pounds today from a high of 227 pounds on Dec 21 2019   Past Medical History:  Diagnosis Date  . Anginal pain ([place])   . Arthritis    Bilateral knee  . CHF (congestive heart failure) ([place])   . Coronary artery disease   . Diabetes mellitus    insulin dependent   . DVT, lower extremity, distal, acute ([place]) 09/09/2018  . Hyperlipidemia   . Hypertension   . Progressive angina ([place]) 03/2015   a. s/p DES to proximal LAD and balloon angioplasty to distal LAD in 2016 b. patent stent by repeat cath in 01/2017 with mild residual CAD)  . Stroke [facility])     Past Surgical History:  Procedure Laterality Date  . APPENDECTOMY    . CARDIAC CATHETERIZATION N/A 04/12/2015   Procedure: Left Heart Cath and Coronary Angiography;  Surgeon: Sherren Mocha, MD; pLAD 75>0% w/ 3.0x12 mm Resolute DES, dLAD 80>0% w/ Angiosculpt scoring balloon, CFX luminal irreg, RCA mild dz, EF 55-65%   . COLONOSCOPY  2011   RMR: 1. Normal rectum 2. Left sided diverticula , 2 ascending colon polyps, status post snare polypectomy. Remainder of colonic mucosa appeared unremarkable.   . COLONOSCOPY N/A 05/13/2016   Procedure: COLONOSCOPY;  Surgeon: Daneil Dolin, MD;  Location[facility];  Service: Endoscopy;  Laterality: N/A;  7:30 AM  .  COLONOSCOPY N/A 05/18/2018   [place]k: Diverticulosis, single tubular adenoma removed.  No plans for future screening or surveillance colonoscopy due to age.  . CORONARY ANGIOGRAPHY N/A 02/04/2017   Procedure: CORONARY ANGIOGRAPHY (CATH LAB);  Surgeon: Troy Sine, MD;  Location: [facility] CV LAB;  Service: Cardiovascular;  Laterality: N/A;  . CORONARY ANGIOPLASTY WITH STENT PLACEMENT  08/22/2014   PTCA/DES X mLAD with 2.5 x 16 mm Promus Premier DES by Dr Julianne Handler  . ESOPHAGOGASTRODUODENOSCOPY N/A  05/18/2018   [place]: Ulcerative reflux esophagitis, hiatal hernia, erosive gastropathy with H. pylori on biopsy, also in the setting of NSAID use.  Marland Kitchen FOOT SURGERY Right   . LEFT HEART CATHETERIZATION WITH CORONARY ANGIOGRAM N/A 08/22/2014   Procedure: LEFT HEART CATHETERIZATION WITH CORONARY ANGIOGRAM;  Surgeon: Burnell Blanks, MD;  Location: [facility] CATH LAB;  Service: Cardiovascular;  Laterality: N/A;  . PERCUTANEOUS CORONARY STENT INTERVENTION (PCI-S)  08/22/2014   Procedure: PERCUTANEOUS CORONARY STENT INTERVENTION (PCI-S);  Surgeon: Burnell Blanks, MD;   . SHOULDER SURGERY    . TONSILLECTOMY      Current Outpatient Medications  Medication Sig Dispense Refill  . amLODipine (NORVASC) 5 MG tablet TAKE (1) TABLET BY MOUTH ONCE DAILY. (Patient taking differently: Take 5 mg by mouth daily. TAKE (1) TABLET BY MOUTH ONCE DAILY.) 90 tablet 1  . Ascorbic Acid (VITAMIN C) 1000 MG tablet Take 500 mg by mouth in the morning and at bedtime.     Marland Kitchen aspirin EC 81 MG tablet Take 81 mg by mouth daily.    Marland Kitchen atorvastatin (LIPITOR) 80 MG tablet TAKE (1) TABLET BY MOUTH ONCE DAILY. (Patient taking differently: Take 80 mg by mouth daily. TAKE (1) TABLET BY MOUTH ONCE DAILY.) 90 tablet 1  . carvedilol (COREG) 12.5 MG tablet TAKE (1) TABLET BY MOUTH TWICE DAILY. (Patient taking differently: Take 12.5 mg by mouth in the morning and at bedtime. TAKE (1) TABLET BY MOUTH TWICE DAILY.) 180 tablet 3  . COMBIGAN 0.2-0.5 % ophthalmic solution Place 1 drop into the left eye 2 (two) times daily.   2  . Cyanocobalamin (B-12) 2500 MCG TABS Take 1 tablet by mouth 3 (three) times a week. Three times a week on Monday Wednesday and Friday     . dutasteride (AVODART) 0.5 MG capsule TAKE ONE CAPSULE BY MOUTH ONCE DAILY. (Patient taking differently: Take 0.5 mg by mouth daily. ) 90 capsule 0  . Emollient (CERAVE) CREA Apply 1 application topically daily at 6 (six) AM.     . erythromycin ophthalmic ointment Place 1 application  into the left eye 3 (three) times daily as needed (infection).     . famotidine (PEPCID) 20 MG tablet Take one tablet po daily 90 tablet 1  . Flaxseed, Linseed, (FLAXSEED OIL PO) Take 1 capsule by mouth daily.     Marland Kitchen glucose blood (CONTOUR NEXT TEST) test strip USE 1 STRIP TO CHECK GLUCOSE THREE TIMES DAILY. FOR ICD 10 E11.9 300 each 0  . insulin glargine (LANTUS) 100 unit/mL SOPN Inject 20 Units into the skin daily.    . insulin lispro (HUMALOG KWIKPEN) 100 UNIT/ML KwikPen Inject twice daily per following: Breakfast 2 units; if 150-250 then 3 units; if greater than 250 5 units. Supper-3 units but if 150-250 do 4 units; if greater that 250 6 units (Patient taking differently: Inject 2-4 Units into the skin See admin instructions. Inject 2 units at breakfast and 4 units at supper daily) 15 mL 5  . Lactobacillus (PROBIOTIC ACIDOPHILUS)  CAPS Take 1 capsule by mouth daily at 6 (six) AM.    . linaclotide (LINZESS) 72 MCG capsule Take 1 capsule (72 mcg total) by mouth daily before breakfast. 30 capsule 3  . mometasone (ELOCON) 0.1 % cream APPLY TO AFFECTED AREAS ONCE DAILY. (Patient taking differently: Apply 1 application topically daily as needed. ) 45 g 3  . Multiple Vitamin (MULTIVITAMIN) tablet Take 1 tablet by mouth daily.    . nitroGLYCERIN (NITROSTAT) 0.4 MG SL tablet PLACE 1 TAB UNDER TONGUE EVERY 5 MIN IF NEEDED FOR CHEST PAIN. MAY USE 3 TIMES.NO RELIEF CALL 911. (Patient taking differently: Place 0.4 mg under the tongue every 5 (five) minutes as needed. PLACE 1 TAB UNDER TONGUE EVERY 5 MIN IF NEEDED FOR CHEST PAIN. MAY USE 3 TIMES.NO RELIEF CALL 911.) 25 tablet 3  . Polyvinyl Alcohol-Povidone (REFRESH OP) Place 1 drop into both ears 2 (two) times daily as needed (dryness).     . sitaGLIPtin (JANUVIA) 100 MG tablet TAKE (1) TABLET BY MOUTH ONCE DAILY. 30 tablet 5  . tamsulosin (FLOMAX) 0.4 MG CAPS capsule TAKE (1) CAPSULE BY MOUTH ONCE DAILY. (Patient taking differently: Take 0.4 mg by mouth daily. )  90 capsule 0  . torsemide (DEMADEX) 20 MG tablet Take 1 tablet (20 mg total) by mouth daily.    . Turmeric 500 MG CAPS Take 500 mg by mouth daily.     No current facility-administered medications for this visit.   Allergies:  Adhesive [tape], Sulfa antibiotics, and Latex   Social History: The patient  reports that he quit smoking about 12 years ago. His smoking use included cigarettes and cigars. He started smoking about 64 years ago. He has a 20.00 pack-year smoking history. He has never used smokeless tobacco. He reports previous alcohol use. He reports that he does not use drugs.   Family History: The patient's family history includes Colon cancer in his maternal uncle; Diabetes in his brother, father, and another family member; Hypertension in his sister; Skin cancer in his mother and sister.   ROS:  Please see the history of present illness. Otherwise, complete review of systems is positive for none.  All other systems are reviewed and negative.   Physical Exam: VS:  BP 136/74   Pulse 81   Ht 5\' 11"  (1.803 m)   Wt 214 lb 12.8 oz (97.4 kg)   SpO2 95%   BMI 29.96 kg/m , BMI Body mass index is 29.96 kg/m.  Wt Readings from Last 3 Encounters:  12/29/19 214 lb 12.8 oz (97.4 kg)  12/29/19 215 lb (97.5 kg)  12/21/19 227 lb (103 kg)    General: Patient appears comfortable at rest. Neck: Supple, no elevated JVP or carotid bruits, no thyromegaly. Lungs: Clear to auscultation, nonlabored breathing at rest. Cardiac: Regular rate and rhythm, no S3 or significant systolic murmur, no pericardial rub. Extremities: Mild pitting edema bilaterally, distal pulses 2+. Skin: Warm and dry.  Musculoskeletal: No kyphosis. Neuropsychiatric: Alert and oriented x3, affect grossly appropriate.  ECG:    Recent Labwork: 11/30/2019: B Natriuretic Peptide 119.0; TSH 4.149 12/11/2019: ALT 24; AST 17; Hemoglobin 10.2; Magnesium 2.2; Platelets 264 12/26/2019: BUN 56; Creatinine, Ser 2.08; Potassium 3.5;  Sodium 140     Component Value Date/Time   CHOL 154 07/27/2019 0742   TRIG 103 07/27/2019 0742   HDL 53 07/27/2019 0742   CHOLHDL 2.9 07/27/2019 0742   CHOLHDL 2.9 07/31/2014 0703   VLDL 19 07/31/2014 0703   LDLCALC 82 07/27/2019  6734    Other Studies Reviewed Today:  Limited echo 2019-12-27  1. Left ventricular ejection fraction, by estimation, is 55 to 60%. The left ventricle has normal function. The left ventricle has no regional wall motion abnormalities. 2. Right ventricular systolic function is normal. The right ventricular size is normal. 3. Cannot evaluate respiratory variation in setting of irregular rhythm. No evidence of RA or RV collapse. IVC is dilated. Overall findings do not support tamponade physiology. Overall effusion appears slightly decreased from 12/15/19 study. . Small to moderate. The pericardial effusion is circumferential. There is no evidence of cardiac tamponade. 4. The inferior vena cava is dilated in size with <50% respiratory variability, suggesting right atrial pressure of 15 mmHg. 5. Limited study to evaluate pericardial effusion    Echocardiogram 12/15/19:  1. Left ventricular ejection fraction, by estimation, is 55%. The left  ventricle has normal function. The left ventricle demonstrates regional  wall motion abnormalities (see scoring diagram/findings for description).  Left ventricular diastolic  parameters are indeterminate.  2. Right ventricular systolic function is normal. The right ventricular  size is normal. There is mildly elevated pulmonary artery systolic  pressure. The estimated right ventricular systolic pressure is 19.3 mmHg.  3. Left atrial size was moderately dilated.  4. Moderate pericardial effusion. The pericardial effusion is  circumferential with anterior predominance. There is evidence of  organization and potential chronicity. No clear right ventricular  compromise or diagnostic variation in mitral outflow to    suggest tamponade physiology.  5. The mitral valve is grossly normal. Mild mitral valve regurgitation.  6. The aortic valve is tricuspid. Aortic valve regurgitation is not  visualized.  7. The inferior vena cava is dilated in size with <50% respiratory  variability, suggesting right atrial pressure of 15 mmHg.   NST: 12/2016  There was no ST segment deviation noted during stress.  Findings consistent with prior inferior/inferoseptal/inferoapical myocardial infarction with moderate peri-infarct ischemia.  This is an intermediate risk study.  The left ventricular ejection fraction is normal (55-65%).  Cardiac Catheterization: 01/2017  Prox RCA lesion, 15 %stenosed.  Dist RCA lesion, 20 %stenosed.  1st Diag lesion, 20 %stenosed.  Ost LAD-1 lesion, 40 %stenosed.  Ost LAD-2 lesion, 0 %stenosed.  Dist LAD-1 lesion, 30 %stenosed.  Dist LAD-2 lesion, 15 %stenosed.  Mild residual CAD with a patent proximal LAD stent and 30-40% narrowing immediately proximal to the stented segment, 20% proximal diagonal stenosis, 30% mid LAD stenosis, patent distal LAD stent with 15% intimal hyperplasia; normal left circumflex system; mild 20% proximal and distal stenoses in a dominant RCA.  RECOMMENDATION: Medical therapy. Recommend echo Doppler study for LV function assessment since despite prolonged attempt at crossing the aortic valve with a catheter due to the significant tortuosity and left ventriculography was not able to be performed during this procedure.   Assessment and Plan:  1. CAD in native artery   2. Essential hypertension   3. Chronic diastolic heart failure ([place])   4. Pericardial effusion   5. Bilateral leg edema   6. Stage 3 chronic kidney disease, unspecified whether stage 3a or 3b CKD    1. CAD in native artery Denies any progressive anginal or exertional symptoms.  Continue aspirin 81 mg, sublingual nitroglycerin as needed  2. Essential hypertension Blood  pressure today 132/64.  Continue amlodipine 5 mg daily.  3. Chronic diastolic heart failure ([place]) Patient denies any current dyspnea.  He has lost approximately 13 pounds since May 27th 2021 from a high of 227  pounds down to 214 pounds today.  Reduce torsemide to 20 mg daily.  Weigh daily and if weight increases 3 pounds in 24 hours or 5 pounds in 7 days take an extra dose of torsemide.  If weight gain continues in spite of these measures call the clinic.  Restrict sodium intake.  Restrict fluids to 60 ounces per day.  4. Pericardial effusion Recent limited echo on June 2nd 2021 showed small to moderate circumferential pericardial effusion which appears reduced since 5 2121 study.  There is no evidence of cardiac tamponade  5. Bilateral leg edema Patient's leg edema edema has decreased some but continues with mild bilateral lower extremity pitting edema.  6. Stage 3 chronic kidney disease, unspecified whether stage 3a or 3b CKD Patient has a basic metabolic panel and magnesium ordered to recheck renal function since being on increased doses of torsemide and metolazone.  Medication Adjustments/Labs and Tests Ordered: Current medicines are reviewed at length with the patient today.  Concerns regarding medicines are outlined above.   Disposition: Follow-up with Dr. Bronson Ing or APP 3 months  Signed, Levell July, NP 12/29/2019 2:22 PM    [facility] at [place], [place], [place] [address] Phone: [phone]; Fax: [phone]

## 2019-12-29 NOTE — Patient Instructions (Signed)
Medication Instructions:   Decrease Torsemide to 20mg  daily.   May take one extra tab for weight gain of 3 lbs in 24 hour time frame or 5 lbs in 1 week.   Continue all other medications.    Labwork: none  Testing/Procedures: none  Follow-Up: 3 months  Any Other Special Instructions Will Be Listed Below (If Applicable). Weigh daily.   If you need a refill on your cardiac medications before your next appointment, please call your pharmacy.

## 2019-12-29 NOTE — Telephone Encounter (Signed)
-----   Message from Arnoldo Lenis, MD sent at 12/29/2019 10:04 AM EDT ----- Some decrease in kidney function likely due to recent metolazone use. He should be completed with metoalzone and just on torsemide 20mg  bid. Needs repeat BMET/Mg next to verify kidney function is improving  Zandra Abts MD

## 2020-01-01 ENCOUNTER — Other Ambulatory Visit: Payer: Self-pay

## 2020-01-01 ENCOUNTER — Telehealth: Payer: Self-pay

## 2020-01-01 ENCOUNTER — Other Ambulatory Visit (HOSPITAL_COMMUNITY)
Admission: RE | Admit: 2020-01-01 | Discharge: 2020-01-01 | Disposition: A | Discharge status: Home / Self Care | Payer: Medicare Other | Source: Ambulatory Visit | Attending: Cardiology | Admitting: Cardiology

## 2020-01-01 ENCOUNTER — Other Ambulatory Visit (HOSPITAL_COMMUNITY)
Admission: RE | Admit: 2020-01-01 | Discharge: 2020-01-01 | Disposition: A | Discharge status: Home / Self Care | Payer: Medicare Other | Source: Ambulatory Visit | Attending: Family Medicine | Admitting: Family Medicine

## 2020-01-01 DIAGNOSIS — Z79899 Other long term (current) drug therapy: Secondary | ICD-10-CM | POA: Diagnosis not present

## 2020-01-01 LAB — BASIC METABOLIC PANEL
Anion gap: 11 (ref 5–15)
BUN: 54 mg/dL — ABNORMAL HIGH (ref 8–23)
CO2: 28 mmol/L (ref 22–32)
Calcium: 8.8 mg/dL — ABNORMAL LOW (ref 8.9–10.3)
Chloride: 98 mmol/L (ref 98–111)
Creatinine, Ser: 1.8 mg/dL — ABNORMAL HIGH (ref 0.61–1.24)
GFR calc Af Amer: 39 mL/min — ABNORMAL LOW (ref >60)
GFR calc non Af Amer: 34 mL/min — ABNORMAL LOW (ref >60)
Glucose, Bld: 105 mg/dL — ABNORMAL HIGH (ref 70–99)
Potassium: 3.8 mmol/L (ref 3.5–5.1)
Sodium: 137 mmol/L (ref 135–145)

## 2020-01-01 LAB — MAGNESIUM: Magnesium: 2.2 mg/dL (ref 1.7–2.4)

## 2020-01-01 MED ORDER — AMOXICILL-CLARITHRO-LANSOPRAZ PO MISC: Freq: Two times a day (BID) | ORAL | 0 refills | Status: DC

## 2020-01-01 NOTE — Telephone Encounter (Signed)
Per RMR- ok to treat for hpylori- rx- prevpac (or generic equivalents) x 10 days. Stop lipitor while on treatment. Pt needs ov in 6 weeks with LSL.  Spoke with pt, he stated he understood all recommendations  and was appreciative for the call. rx sent to [facility].  Please schedule ov with [facility].

## 2020-01-02 ENCOUNTER — Encounter: Payer: Self-pay | Admitting: Internal Medicine

## 2020-01-02 NOTE — Telephone Encounter (Signed)
PATIENT SCHEDULED  °

## 2020-01-04 NOTE — Progress Notes (Addendum)
Discussed with Dr. Gala Romney. Will treat H.pylori and have patient follow up in six weeks. May consider EGD to evaluate abnormal duodenum on CT and reassess for H.pylori. see separate telephone note.

## 2020-01-04 NOTE — Telephone Encounter (Signed)
Noted. Thanks.

## 2020-01-05 ENCOUNTER — Encounter: Payer: Self-pay | Admitting: Family Medicine

## 2020-01-08 ENCOUNTER — Other Ambulatory Visit: Payer: Self-pay | Admitting: Family Medicine

## 2020-01-11 DIAGNOSIS — Z79899 Other long term (current) drug therapy: Secondary | ICD-10-CM | POA: Diagnosis not present

## 2020-01-12 ENCOUNTER — Telehealth: Payer: Self-pay | Admitting: *Deleted

## 2020-01-12 LAB — BASIC METABOLIC PANEL
BUN/Creatinine Ratio: 18 (ref 10–24)
BUN: 34 mg/dL — ABNORMAL HIGH (ref 8–27)
CO2: 23 mmol/L (ref 20–29)
Calcium: 9 mg/dL (ref 8.6–10.2)
Chloride: 103 mmol/L (ref 96–106)
Creatinine, Ser: 1.87 mg/dL — ABNORMAL HIGH (ref 0.76–1.27)
GFR calc Af Amer: 37 mL/min/{1.73_m2} — ABNORMAL LOW (ref >59)
GFR calc non Af Amer: 32 mL/min/{1.73_m2} — ABNORMAL LOW (ref >59)
Glucose: 50 mg/dL — ABNORMAL LOW (ref 65–99)
Potassium: 4.9 mmol/L (ref 3.5–5.2)
Sodium: 140 mmol/L (ref 134–144)

## 2020-01-12 NOTE — Telephone Encounter (Signed)
Laurine Blazer, LPN  1/88/6773 7:36 AM EDT Back to Top    Notified, copy to pcp.

## 2020-01-12 NOTE — Telephone Encounter (Signed)
-----   Message from Marie sent at 01/09/2020  9:29 AM EDT -----  ----- Message ----- From: Arnoldo Lenis, MD Sent: 01/08/2020  11:26 AM EDT To: Massie Maroon, CMA  Kidney function remains decreased but improved from last check, no changes   Zandra Abts MD

## 2020-01-15 ENCOUNTER — Encounter: Payer: Self-pay | Admitting: Family Medicine

## 2020-01-15 NOTE — Telephone Encounter (Signed)
Patient notified and scheduled for tomorrow with Dr. Nicki Reaper.

## 2020-01-15 NOTE — Telephone Encounter (Signed)
Nurses Please schedule the patient to do a follow-up with me this week.  Tuesday or no later than Wednesday

## 2020-01-16 ENCOUNTER — Encounter: Payer: Self-pay | Admitting: Family Medicine

## 2020-01-16 ENCOUNTER — Other Ambulatory Visit: Payer: Self-pay

## 2020-01-16 ENCOUNTER — Ambulatory Visit (INDEPENDENT_AMBULATORY_CARE_PROVIDER_SITE_OTHER): Payer: Medicare Other | Admitting: Family Medicine

## 2020-01-16 VITALS — BP 132/74 | Temp 97.4°F | Wt 217.4 lb

## 2020-01-16 DIAGNOSIS — I10 Essential (primary) hypertension: Secondary | ICD-10-CM | POA: Diagnosis not present

## 2020-01-16 DIAGNOSIS — N289 Disorder of kidney and ureter, unspecified: Secondary | ICD-10-CM

## 2020-01-16 DIAGNOSIS — I2510 Atherosclerotic heart disease of native coronary artery without angina pectoris: Secondary | ICD-10-CM | POA: Diagnosis not present

## 2020-01-16 MED ORDER — TORSEMIDE 20 MG PO TABS: ORAL_TABLET | ORAL | 4 refills | Status: DC

## 2020-01-16 NOTE — Progress Notes (Signed)
   Subjective:    Patient ID: Jerry Burns, male    DOB: Oct 06, 1933, 86 y.o.   MRN: [id number]  HPI Pt having swelling in both legs. Saturday pt began to have shortness of breath with activity. Pt is having some stinging in legs(feels like poison ivy). The stinging goes down when swelling goes down. Pt states that his abdomen is swollen also.   Pt also having some dizziness and increased thirst.  He does sometimes get woozy if he stands up too quickly Also concerned about losing muscle and not being excited to get up and start the day.  Patient at times gets discouraged but he denies being depressed  Review of Systems     Objective:   Physical Exam  He has significant pedal edema of both legs. I reviewed his weight chart it did go up dramatically over the past 2 weeks He has come down 3 pounds in the past couple days with increased diuretic There is concern about drying out his kidneys if too aggressive with the diuretic Lungs clear no crackles heart rate is controlled      Assessment & Plan:  Pedal edema along with increased shortness of breath and weight gain  Bump up the dose of the Demadex 2 tablets daily through Wednesday then after that 4 days a week will be 2 tablets daily the other 3 days will be 1 tablet  Check metabolic 7 next week  Keep follow-up visit July 8  Follow-up with nephrology as well  Warning signs regarding CHF flareup was discussed

## 2020-01-23 LAB — BASIC METABOLIC PANEL
BUN/Creatinine Ratio: 22 (ref 10–24)
BUN: 36 mg/dL — ABNORMAL HIGH (ref 8–27)
CO2: 23 mmol/L (ref 20–29)
Calcium: 9.2 mg/dL (ref 8.6–10.2)
Chloride: 101 mmol/L (ref 96–106)
Creatinine, Ser: 1.65 mg/dL — ABNORMAL HIGH (ref 0.76–1.27)
GFR calc Af Amer: 43 mL/min/{1.73_m2} — ABNORMAL LOW (ref >59)
GFR calc non Af Amer: 38 mL/min/{1.73_m2} — ABNORMAL LOW (ref >59)
Glucose: 139 mg/dL — ABNORMAL HIGH (ref 65–99)
Potassium: 4.7 mmol/L (ref 3.5–5.2)
Sodium: 138 mmol/L (ref 134–144)

## 2020-02-01 ENCOUNTER — Ambulatory Visit (INDEPENDENT_AMBULATORY_CARE_PROVIDER_SITE_OTHER): Payer: Medicare Other | Admitting: Family Medicine

## 2020-02-01 ENCOUNTER — Other Ambulatory Visit: Payer: Self-pay

## 2020-02-01 ENCOUNTER — Encounter: Payer: Self-pay | Admitting: Family Medicine

## 2020-02-01 VITALS — BP 112/68 | Temp 97.1°F | Wt 209.2 lb

## 2020-02-01 DIAGNOSIS — I2510 Atherosclerotic heart disease of native coronary artery without angina pectoris: Secondary | ICD-10-CM | POA: Diagnosis not present

## 2020-02-01 DIAGNOSIS — N289 Disorder of kidney and ureter, unspecified: Secondary | ICD-10-CM | POA: Diagnosis not present

## 2020-02-01 DIAGNOSIS — I10 Essential (primary) hypertension: Secondary | ICD-10-CM

## 2020-02-01 MED ORDER — AMLODIPINE BESYLATE 2.5 MG PO TABS: 2.5000 mg | ORAL_TABLET | Freq: Every day | ORAL | 1 refills | Status: DC

## 2020-02-01 NOTE — Progress Notes (Addendum)
   Subjective:    Patient ID: Jerry Burns, male    DOB: [birth year], 86 y.o.   MRN: [id number]  HPI Patient comes in today for follow up on pedal edema, sob and weight gain after increase in Demadex.  Patient presents for follow-up Recently they started him on Jardiance low-dose Nephrology is following him His swelling is gone down Weights going down Overall hanging in there feeling better  Weight 6/22 217lbs  Today 209.2lbs Review of Systems Denies dizziness this up occasionally denies chest tightness pressure pain shortness of breath    Objective:   Physical Exam Lungs clear respiratory rate normal heart regular no murmurs abdomen soft extremities some edema not severe   Blood pressure 108/74    Assessment & Plan:  Reduce amlodipine 2.5 mg new dose Get labs from nephrology sent to [place] Patient starting Jardiance he understands the importance of if he starts having low sugars to reduce his insulin and also if he is having dizzy spells or passing out spells to immediately connect with [place]  Follow-up in several weeks to recheck blood pressure sitting and standing  Patient with frequent fluctuations of his sugar sometimes low sometimes high.  Patient to track his readings typically 3 times a day.  Sometimes 4 because of fluctuating sugars.

## 2020-02-13 DIAGNOSIS — I10 Essential (primary) hypertension: Secondary | ICD-10-CM | POA: Diagnosis not present

## 2020-02-13 DIAGNOSIS — N401 Enlarged prostate with lower urinary tract symptoms: Secondary | ICD-10-CM | POA: Diagnosis not present

## 2020-02-13 DIAGNOSIS — E785 Hyperlipidemia, unspecified: Secondary | ICD-10-CM | POA: Diagnosis not present

## 2020-02-13 DIAGNOSIS — E1121 Type 2 diabetes mellitus with diabetic nephropathy: Secondary | ICD-10-CM | POA: Diagnosis not present

## 2020-02-13 DIAGNOSIS — Z794 Long term (current) use of insulin: Secondary | ICD-10-CM | POA: Diagnosis not present

## 2020-02-13 DIAGNOSIS — E1169 Type 2 diabetes mellitus with other specified complication: Secondary | ICD-10-CM | POA: Diagnosis not present

## 2020-02-13 DIAGNOSIS — R3912 Poor urinary stream: Secondary | ICD-10-CM | POA: Diagnosis not present

## 2020-02-13 DIAGNOSIS — N1832 Chronic kidney disease, stage 3b: Secondary | ICD-10-CM | POA: Diagnosis not present

## 2020-02-13 DIAGNOSIS — N1831 Chronic kidney disease, stage 3a: Secondary | ICD-10-CM | POA: Diagnosis not present

## 2020-02-14 LAB — LIPID PANEL
Chol/HDL Ratio: 2.6 ratio (ref 0.0–5.0)
Cholesterol, Total: 107 mg/dL (ref 100–199)
HDL: 41 mg/dL (ref >39)
LDL Chol Calc (NIH): 54 mg/dL (ref 0–99)
Triglycerides: 52 mg/dL (ref 0–149)
VLDL Cholesterol Cal: 12 mg/dL (ref 5–40)

## 2020-02-14 LAB — BASIC METABOLIC PANEL
BUN/Creatinine Ratio: 24 (ref 10–24)
BUN: 46 mg/dL — ABNORMAL HIGH (ref 8–27)
CO2: 24 mmol/L (ref 20–29)
Calcium: 9.6 mg/dL (ref 8.6–10.2)
Chloride: 103 mmol/L (ref 96–106)
Creatinine, Ser: 1.89 mg/dL — ABNORMAL HIGH (ref 0.76–1.27)
GFR calc Af Amer: 37 mL/min/{1.73_m2} — ABNORMAL LOW (ref >59)
GFR calc non Af Amer: 32 mL/min/{1.73_m2} — ABNORMAL LOW (ref >59)
Glucose: 67 mg/dL (ref 65–99)
Potassium: 5.2 mmol/L (ref 3.5–5.2)
Sodium: 141 mmol/L (ref 134–144)

## 2020-02-14 LAB — HEMOGLOBIN A1C
Est. average glucose Bld gHb Est-mCnc: 171 mg/dL
Hgb A1c MFr Bld: 7.6 % — ABNORMAL HIGH (ref 4.8–5.6)

## 2020-02-16 ENCOUNTER — Ambulatory Visit (INDEPENDENT_AMBULATORY_CARE_PROVIDER_SITE_OTHER): Payer: Medicare Other | Admitting: Gastroenterology

## 2020-02-16 ENCOUNTER — Other Ambulatory Visit: Payer: Self-pay

## 2020-02-16 ENCOUNTER — Encounter: Payer: Self-pay | Admitting: Gastroenterology

## 2020-02-16 VITALS — BP 117/76 | HR 65 | Temp 97.2°F | Ht 72.0 in | Wt 206.6 lb

## 2020-02-16 DIAGNOSIS — A048 Other specified bacterial intestinal infections: Secondary | ICD-10-CM

## 2020-02-19 NOTE — Progress Notes (Signed)
Patient left without being seen.

## 2020-02-22 ENCOUNTER — Ambulatory Visit (INDEPENDENT_AMBULATORY_CARE_PROVIDER_SITE_OTHER): Payer: Medicare Other | Admitting: Family Medicine

## 2020-02-22 ENCOUNTER — Other Ambulatory Visit: Payer: Self-pay

## 2020-02-22 ENCOUNTER — Encounter: Payer: Self-pay | Admitting: Family Medicine

## 2020-02-22 VITALS — BP 116/68 | Temp 97.7°F | Ht 72.0 in | Wt 203.8 lb

## 2020-02-22 DIAGNOSIS — E1121 Type 2 diabetes mellitus with diabetic nephropathy: Secondary | ICD-10-CM

## 2020-02-22 DIAGNOSIS — E1122 Type 2 diabetes mellitus with diabetic chronic kidney disease: Secondary | ICD-10-CM

## 2020-02-22 DIAGNOSIS — Z794 Long term (current) use of insulin: Secondary | ICD-10-CM

## 2020-02-22 DIAGNOSIS — I2510 Atherosclerotic heart disease of native coronary artery without angina pectoris: Secondary | ICD-10-CM

## 2020-02-22 DIAGNOSIS — E785 Hyperlipidemia, unspecified: Secondary | ICD-10-CM

## 2020-02-22 DIAGNOSIS — I10 Essential (primary) hypertension: Secondary | ICD-10-CM

## 2020-02-22 DIAGNOSIS — E1169 Type 2 diabetes mellitus with other specified complication: Secondary | ICD-10-CM

## 2020-02-22 DIAGNOSIS — N1831 Chronic kidney disease, stage 3a: Secondary | ICD-10-CM

## 2020-02-22 NOTE — Progress Notes (Addendum)
Subjective:    Patient ID: Jerry Burns, male    DOB: 02/10/1934, 86 y.o.   MRN: [id number]  HPI  Feels much better. Walked 1/4 mile this morning and felt well. Reports last 4 months his routine is different but diet has gotten better.  Follow up on bp. Pt brought in BP readings which patient is concerned that these blood pressures on the low side. Denies feeling dizzy, off-balance, but reports being super cautious when standing up. Patient records have weights, blood sugars, and blood pressures. Fasting blood sugars are between 70's-158. 8/29 days are below 100 in morning.  Results for orders placed or performed in visit on 59/93/57  Basic Metabolic Panel (BMET)  Result Value Ref Range   Glucose 139 (H) 65 - 99 mg/dL   BUN 36 (H) 8 - 27 mg/dL   Creatinine, Ser 1.65 (H) 0.76 - 1.27 mg/dL   GFR calc non Af Amer 38 (L) >59 mL/min/1.73   GFR calc Af Amer 43 (L) >59 mL/min/1.73   BUN/Creatinine Ratio 22 10 - 24   Sodium 138 134 - 144 mmol/L   Potassium 4.7 3.5 - 5.2 mmol/L   Chloride 101 96 - 106 mmol/L   CO2 23 20 - 29 mmol/L   Calcium 9.2 8.6 - 10.2 mg/dL   Type 2 diabetes mellitus with stage 3a chronic kidney disease, with long-term current use of insulin ([place]), Chronic  Essential hypertension  Hyperlipidemia associated with type 2 diabetes mellitus ([place])  Patient is seen nephrologist on a regular basis as well as cardiology.  Review of Systems  Constitutional: Positive for unexpected weight change.  Eyes: Positive for redness. Negative for photophobia, pain, discharge, itching and visual disturbance.  Respiratory: Negative.   Cardiovascular: Positive for leg swelling.  Gastrointestinal: Negative.   Endocrine: Negative.   Genitourinary: Positive for penile swelling. Negative for discharge, hematuria and penile pain.  Musculoskeletal: Negative.   Skin: Negative.   Eye issues being followed by Dr. Vickki Muff in [place].Wants to know if it is okay to get surgery on eyes. Reports  has lost weight, but wishes to stop losing weight. Taking Linzess for constipation. Penis inspected foreskin found to be adhered to glans. Instructed to gently retract. No infection noted today.    Objective:   Physical Exam Vitals and nursing note reviewed.  Constitutional:      Appearance: Normal appearance.  HENT:     Head: Normocephalic.     Nose: Nose normal.     Mouth/Throat:     Mouth: Mucous membranes are moist.  Eyes:     Pupils: Pupils are equal, round, and reactive to light.  Cardiovascular:     Rate and Rhythm: Normal rate and regular rhythm.     Heart sounds: Normal heart sounds.  Pulmonary:     Effort: Pulmonary effort is normal.     Breath sounds: Normal breath sounds.  Abdominal:     General: Bowel sounds are normal.  Musculoskeletal:        General: Normal range of motion.     Cervical back: Normal range of motion.  Skin:    General: Skin is warm and dry.  Neurological:     General: No focal deficit present.     Mental Status: He is alert and oriented to person, place, and time.  Psychiatric:        Mood and Affect: Mood normal.        Behavior: Behavior normal.   Left conjunctiva with erythemia- being followed  by opthm  -- patient cannot recall what name of eye issue.  Right lower extremity with abrasions/scratches from an encounter with the neighbors rooster. Decreased sensation noted in feet.  Bil leg swelling is much better than in past, continues to be +1 pitting edema.  Concerned about penile swelling.        Assessment & Plan:  Monofilament with decreased sensation in some areas on feet.  Came in contact with rooster and has abrasions/scratches on right leg.Counseled to keep an eye out for infection of lower right extremity. Let us know ASAP if any changes in scratches on right lower extremity.   Up to date on tetanus.  Counseled on diabetic foot care and daily inspection of feet.  Sees dietician next week.  Problem List Items  Addressed This Visit    Type 2 diabetes mellitus with stage 3a chronic kidney disease, with long-term current use of insulin ([place]) - Primary   Relevant Medications   empagliflozin (JARDIANCE) 10 MG TABS tablet     1. Type 2 diabetes mellitus with stage 3a chronic kidney disease, with long-term current use of insulin ([place]) Diabetes could be under little bit better control.  2. Essential hypertension Pressure under good control continue current measures  3. Hyperlipidemia associated with type 2 diabetes mellitus ([facility]) Cholesterol profile looks good continue current medication  Chronic kidney disease stable following up with nephrology in the near future  Follow-up with [place] in 3 months sooner problems Should be noted that this patient is insulin-dependent Has wildly fluctuating glucoses with low sugars and high sugars Necessary for him to check it 3 times daily Patient monitors this on a regular basis and gives [place] readouts on a regular basis.

## 2020-02-22 NOTE — Patient Instructions (Addendum)
Diabetes Mellitus and Foot Care Foot care is an important part of your health, especially when you have diabetes. Diabetes may cause you to have problems because of poor blood flow (circulation) to your feet and legs, which can cause your skin to:  Become thinner and drier.  Break more easily.  Heal more slowly.  Peel and crack. You may also have nerve damage (neuropathy) in your legs and feet, causing decreased feeling in them. This means that you may not notice minor injuries to your feet that could lead to more serious problems. Noticing and addressing any potential problems early is the best way to prevent future foot problems. How to care for your feet Foot hygiene  Wash your feet daily with warm water and mild soap. Do not use hot water. Then, pat your feet and the areas between your toes until they are completely dry. Do not soak your feet as this can dry your skin.  Trim your toenails straight across. Do not dig under them or around the cuticle. File the edges of your nails with an emery board or nail file.  Apply a moisturizing lotion or petroleum jelly to the skin on your feet and to dry, brittle toenails. Use lotion that does not contain alcohol and is unscented. Do not apply lotion between your toes. Shoes and socks  Wear clean socks or stockings every day. Make sure they are not too tight. Do not wear knee-high stockings since they may decrease blood flow to your legs.  Wear shoes that fit properly and have enough cushioning. Always look in your shoes before you put them on to be sure there are no objects inside.  To break in new shoes, wear them for just a few hours a day. This prevents injuries on your feet. Wounds, scrapes, corns, and calluses  Check your feet daily for blisters, cuts, bruises, sores, and redness. If you cannot see the bottom of your feet, use a mirror or ask someone for help.  Do not cut corns or calluses or try to remove them with medicine.  If you  find a minor scrape, cut, or break in the skin on your feet, keep it and the skin around it clean and dry. You may clean these areas with mild soap and water. Do not clean the area with peroxide, alcohol, or iodine.  If you have a wound, scrape, corn, or callus on your foot, look at it several times a day to make sure it is healing and not infected. Check for: ? Redness, swelling, or pain. ? Fluid or blood. ? Warmth. ? Pus or a bad smell. General instructions  Do not cross your legs. This may decrease blood flow to your feet.  Do not use heating pads or hot water bottles on your feet. They may burn your skin. If you have lost feeling in your feet or legs, you may not know this is happening until it is too late.  Protect your feet from hot and cold by wearing shoes, such as at the beach or on hot pavement.  Schedule a complete foot exam at least once a year (annually) or more often if you have foot problems. If you have foot problems, report any cuts, sores, or bruises to your health care provider immediately. Contact a health care provider if:  You have a medical condition that increases your risk of infection and you have any cuts, sores, or bruises on your feet.  You have an injury that is not  healing.  You have redness on your legs or feet.  You feel burning or tingling in your legs or feet.  You have pain or cramps in your legs and feet.  Your legs or feet are numb.  Your feet always feel cold.  You have pain around a toenail. Get help right away if:  You have a wound, scrape, corn, or callus on your foot and: ? You have pain, swelling, or redness that gets worse. ? You have fluid or blood coming from the wound, scrape, corn, or callus. ? Your wound, scrape, corn, or callus feels warm to the touch. ? You have pus or a bad smell coming from the wound, scrape, corn, or callus. ? You have a fever. ? You have a red line going up your leg. Summary  Check your feet every day  for cuts, sores, red spots, swelling, and blisters.  Moisturize feet and legs daily.  Wear shoes that fit properly and have enough cushioning.  If you have foot problems, report any cuts, sores, or bruises to your health care provider immediately.  Schedule a complete foot exam at least once a year (annually) or more often if you have foot problems. This information is not intended to replace advice given to you by your health care provider. Make sure you discuss any questions you have with your health care provider. Document Revised: 04/05/2019 Document Reviewed: 08/14/2016 Elsevier Patient Education  [place].  Orthostatic Hypotension Blood pressure is a measurement of how strongly, or weakly, your blood is pressing against the walls of your arteries. Orthostatic hypotension is a sudden drop in blood pressure that happens when you quickly change positions, such as when you get up from sitting or lying down. Arteries are blood vessels that carry blood from your heart throughout your body. When blood pressure is too low, you may not get enough blood to your brain or to the rest of your organs. This can cause weakness, light-headedness, rapid heartbeat, and fainting. This can last for just a few seconds or for up to a few minutes. Orthostatic hypotension is usually not a serious problem. However, if it happens frequently or gets worse, it may be a sign of something more serious. What are the causes? This condition may be caused by:  Sudden changes in posture, such as standing up quickly after you have been sitting or lying down.  Blood loss.  Loss of body fluids (dehydration).  Heart problems.  Hormone (endocrine) problems.  Pregnancy.  Severe infection.  Lack of certain nutrients.  Severe allergic reactions (anaphylaxis).  Certain medicines, such as blood pressure medicine or medicines that make the body lose excess fluids (diuretics). Sometimes, this condition can be  caused by not taking medicine as directed, such as taking too much of a certain medicine. What increases the risk? The following factors may make you more likely to develop this condition:  Age. Risk increases as you get older.  Conditions that affect the heart or the central nervous system.  Taking certain medicines, such as blood pressure medicine or diuretics.  Being pregnant. What are the signs or symptoms? Symptoms of this condition may include:  Weakness.  Light-headedness.  Dizziness.  Blurred vision.  Fatigue.  Rapid heartbeat.  Fainting, in severe cases. How is this diagnosed? This condition is diagnosed based on:  Your medical history.  Your symptoms.  Your blood pressure measurement. Your health care provider will check your blood pressure when you are: ? Lying down. ?  Sitting. ? Standing. A blood pressure reading is recorded as two numbers, such as "120 over 80" (or 120/80). The first ("top") number is called the systolic pressure. It is a measure of the pressure in your arteries as your heart beats. The second ("bottom") number is called the diastolic pressure. It is a measure of the pressure in your arteries when your heart relaxes between beats. Blood pressure is measured in a unit called mm Hg. Healthy blood pressure for most adults is 120/80. If your blood pressure is below 90/60, you may be diagnosed with hypotension. Other information or tests that may be used to diagnose orthostatic hypotension include:  Your other vital signs, such as your heart rate and temperature.  Blood tests.  Tilt table test. For this test, you will be safely secured to a table that moves you from a lying position to an upright position. Your heart rhythm and blood pressure will be monitored during the test. How is this treated? This condition may be treated by:  Changing your diet. This may involve eating more salt (sodium) or drinking more water.  Taking medicines to  raise your blood pressure.  Changing the dosage of certain medicines you are taking that might be lowering your blood pressure.  Wearing compression stockings. These stockings help to prevent blood clots and reduce swelling in your legs. In some cases, you may need to go to the hospital for:  Fluid replacement. This means you will receive fluids through an IV.  Blood replacement. This means you will receive donated blood through an IV (transfusion).  Treating an infection or heart problems, if this applies.  Monitoring. You may need to be monitored while medicines that you are taking wear off. Follow these instructions at home: Eating and drinking   Drink enough fluid to keep your urine pale yellow.  Eat a healthy diet, and follow instructions from your health care provider about eating or drinking restrictions. A healthy diet includes: ? Fresh fruits and vegetables. ? Whole grains. ? Lean meats. ? Low-fat dairy products.  Eat extra salt only as directed. Do not add extra salt to your diet unless your health care provider told you to do that.  Eat frequent, small meals.  Avoid standing up suddenly after eating. Medicines  Take over-the-counter and prescription medicines only as told by your health care provider. ? Follow instructions from your health care provider about changing the dosage of your current medicines, if this applies. ? Do not stop or adjust any of your medicines on your own. General instructions   Wear compression stockings as told by your health care provider.  Get up slowly from lying down or sitting positions. This gives your blood pressure a chance to adjust.  Avoid hot showers and excessive heat as directed by your health care provider.  Return to your normal activities as told by your health care provider. Ask your health care provider what activities are safe for you.  Do not use any products that contain nicotine or tobacco, such as cigarettes,  e-cigarettes, and chewing tobacco. If you need help quitting, ask your health care provider.  Keep all follow-up visits as told by your health care provider. This is important. Contact a health care provider if you:  Vomit.  Have diarrhea.  Have a fever for more than 2-3 days.  Feel more thirsty than usual.  Feel weak and tired. Get help right away if you:  Have chest pain.  Have a fast or irregular heartbeat.  Develop numbness in any part of your body.  Cannot move your arms or your legs.  Have trouble speaking.  Become sweaty or feel light-headed.  Faint.  Feel short of breath.  Have trouble staying awake.  Feel confused. Summary  Orthostatic hypotension is a sudden drop in blood pressure that happens when you quickly change positions.  Orthostatic hypotension is usually not a serious problem.  It is diagnosed by having your blood pressure taken lying down, sitting, and then standing.  It may be treated by changing your diet or adjusting your medicines. This information is not intended to replace advice given to you by your health care provider. Make sure you discuss any questions you have with your health care provider. Document Revised: 01/06/2018 Document Reviewed: 01/06/2018 Elsevier Patient Education  [place].  Preventing Hypoglycemia Hypoglycemia occurs when the level of sugar (glucose) in the blood is too low. Hypoglycemia can happen in people who do or do not have diabetes (diabetes mellitus). It can develop quickly, and it can be a medical emergency. For most people with diabetes, a blood glucose level below 70 mg/dL (3.9 mmol/L) is considered hypoglycemia. Glucose is a type of sugar that provides the body's main source of energy. Certain hormones (insulin and glucagon) control the level of glucose in the blood. Insulin lowers blood glucose, and glucagon increases blood glucose. Hypoglycemia can result from having too much insulin in the  bloodstream, or from not eating enough food that contains glucose. Your risk for hypoglycemia is higher:  If you take insulin or diabetes medicines to help lower your blood glucose or help your body make more insulin.  If you skip or delay a meal or snack.  If you are ill.  During and after exercise. You can prevent hypoglycemia by working with your health care provider to adjust your meal plan as needed and by taking other precautions. How can hypoglycemia affect me? Mild symptoms Mild hypoglycemia may not cause any symptoms. If you do have symptoms, they may include:  Hunger.  Anxiety.  Sweating and feeling clammy.  Dizziness or feeling light-headed.  Sleepiness.  Nausea.  Increased heart rate.  Headache.  Blurry vision.  Irritability.  Tingling or numbness around the mouth, lips, or tongue.  A change in coordination.  Restless sleep. If mild hypoglycemia is not recognized and treated, it can quickly become moderate or severe hypoglycemia. Moderate symptoms Moderate hypoglycemia can cause:  Mental confusion and poor judgment.  Behavior changes.  Weakness.  Irregular heartbeat. Severe symptoms Severe hypoglycemia is a medical emergency. It can cause:  Fainting.  Seizures.  Loss of consciousness (coma).  Death. What nutrition changes can be made?  Work with your health care provider or diet and nutrition specialist (dietitian) to make a healthy meal plan that is right for you. Follow your meal plan carefully.  Eat meals at regular times.  If recommended by your health care provider, have snacks between meals.  Donot skip or delay meals or snacks. You can be at risk for hypoglycemia if you are not getting enough carbohydrates. What lifestyle changes can be made?   Work closely with your health care provider to manage your blood glucose. Make sure you know: ? Your goal blood glucose levels. ? How and when to check your blood glucose. ? The  symptoms of hypoglycemia. It is important to treat it right away to keep it from becoming severe.  Do not drink alcohol on an empty stomach.  When you are ill,  check your blood glucose more often than usual. Follow your sick day plan whenever you cannot eat or drink normally. Make this plan in advance with your health care provider.  Always check your blood glucose before, during, and after exercise. How is this treated? This condition can often be treated by immediately eating or drinking something that contains sugar, such as:  Fruit juice, 4-6 oz (120-150 mL).  Regular (not diet) soda, 4-6 oz (120-150 mL).  Low-fat milk, 4 oz (120 mL).  Several pieces of hard candy.  Sugar or honey, 1 Tbsp (15 mL). Treating hypoglycemia if you have diabetes If you are alert and able to swallow safely, follow the 15:15 rule:  Take 15 grams of a rapid-acting carbohydrate. Talk with your health care provider about how much you should take.  Rapid-acting options include: ? Glucose pills (take 15 grams). ? 6-8 pieces of hard candy. ? 4-6 oz (120-150 mL) of fruit juice. ? 4-6 oz (120-150 mL) of regular (not diet) soda.  Check your blood glucose 15 minutes after you take the carbohydrate.  If the repeat blood glucose level is still at or below 70 mg/dL (3.9 mmol/L), take 15 grams of a carbohydrate again.  If your blood glucose level does not increase above 70 mg/dL (3.9 mmol/L) after 3 tries, seek emergency medical care.  After your blood glucose level returns to normal, eat a meal or a snack within 1 hour. Treating severe hypoglycemia Severe hypoglycemia is when your blood glucose level is at or below 54 mg/dL (3 mmol/L). Severe hypoglycemia is a medical emergency. Get medical help right away. If you have severe hypoglycemia and you cannot eat or drink, you may need an injection of glucagon. A family member or close friend should learn how to check your blood glucose and how to give you a glucagon  injection. Ask your health care provider if you need to have an emergency glucagon injection kit available. Severe hypoglycemia may need to be treated in a hospital. The treatment may include getting glucose through an IV. You may also need treatment for the cause of your hypoglycemia. Where to find more information  American Diabetes Association: www.diabetes.[employer] of Diabetes and Digestive and Kidney Diseases: [employer] Contact a health care provider if:  You have problems keeping your blood glucose in your target range.  You have frequent episodes of hypoglycemia. Get help right away if:  You continue to have hypoglycemia symptoms after eating or drinking something containing glucose.  Your blood glucose level is at or below 54 mg/dL (3 mmol/L).  You faint.  You have a seizure. These symptoms may represent a serious problem that is an emergency. Do not wait to see if the symptoms will go away. Get medical help right away. Call your local emergency services (911 in the [place] Summary  Know the symptoms of hypoglycemia, and when you are at risk for it (such as during exercise or when you are sick). Check your blood glucose often when you are at risk for hypoglycemia.  Hypoglycemia can develop quickly, and it can be dangerous if it is not treated right away. If you have a history of severe hypoglycemia, make sure you know how to use your glucagon injection kit.  Make sure you know how to treat hypoglycemia. Keep a carbohydrate snack available when you may be at risk for hypoglycemia. This information is not intended to replace advice given to you by your health care provider. Make sure you discuss any  questions you have with your health care provider. Document Revised: 11/04/2018 Document Reviewed: 03/10/2017 Elsevier Patient Education  [place].

## 2020-02-26 ENCOUNTER — Other Ambulatory Visit: Payer: Self-pay | Admitting: *Deleted

## 2020-02-26 MED ORDER — CARVEDILOL 12.5 MG PO TABS: 12.5000 mg | ORAL_TABLET | Freq: Two times a day (BID) | ORAL | 3 refills | Status: DC

## 2020-02-28 ENCOUNTER — Other Ambulatory Visit: Payer: Self-pay

## 2020-02-28 ENCOUNTER — Inpatient Hospital Stay (HOSPITAL_COMMUNITY): Payer: Medicare Other | Attending: Hematology

## 2020-02-28 DIAGNOSIS — I10 Essential (primary) hypertension: Secondary | ICD-10-CM | POA: Insufficient documentation

## 2020-02-28 DIAGNOSIS — I82441 Acute embolism and thrombosis of right tibial vein: Secondary | ICD-10-CM | POA: Diagnosis not present

## 2020-02-28 DIAGNOSIS — Z794 Long term (current) use of insulin: Secondary | ICD-10-CM | POA: Insufficient documentation

## 2020-02-28 DIAGNOSIS — E119 Type 2 diabetes mellitus without complications: Secondary | ICD-10-CM | POA: Insufficient documentation

## 2020-02-28 DIAGNOSIS — Z87891 Personal history of nicotine dependence: Secondary | ICD-10-CM | POA: Insufficient documentation

## 2020-02-28 DIAGNOSIS — Z79899 Other long term (current) drug therapy: Secondary | ICD-10-CM | POA: Diagnosis not present

## 2020-02-28 DIAGNOSIS — D649 Anemia, unspecified: Secondary | ICD-10-CM | POA: Diagnosis not present

## 2020-02-28 DIAGNOSIS — Z7982 Long term (current) use of aspirin: Secondary | ICD-10-CM | POA: Diagnosis not present

## 2020-02-28 LAB — CBC WITH DIFFERENTIAL/PLATELET
Abs Immature Granulocytes: 0.03 10*3/uL (ref 0.00–0.07)
Basophils Absolute: 0.1 10*3/uL (ref 0.0–0.1)
Basophils Relative: 1 %
Eosinophils Absolute: 0.3 10*3/uL (ref 0.0–0.5)
Eosinophils Relative: 5 %
HCT: 35.8 % — ABNORMAL LOW (ref 39.0–52.0)
Hemoglobin: 11.2 g/dL — ABNORMAL LOW (ref 13.0–17.0)
Immature Granulocytes: 0 %
Lymphocytes Relative: 11 %
Lymphs Abs: 0.7 10*3/uL (ref 0.7–4.0)
MCH: 29.1 pg (ref 26.0–34.0)
MCHC: 31.3 g/dL (ref 30.0–36.0)
MCV: 93 fL (ref 80.0–100.0)
Monocytes Absolute: 0.8 10*3/uL (ref 0.1–1.0)
Monocytes Relative: 12 %
Neutro Abs: 4.9 10*3/uL (ref 1.7–7.7)
Neutrophils Relative %: 71 %
Platelets: 180 10*3/uL (ref 150–400)
RBC: 3.85 MIL/uL — ABNORMAL LOW (ref 4.22–5.81)
RDW: 16.8 % — ABNORMAL HIGH (ref 11.5–15.5)
WBC: 6.8 10*3/uL (ref 4.0–10.5)
nRBC: 0 % (ref 0.0–0.2)

## 2020-02-28 LAB — COMPREHENSIVE METABOLIC PANEL
ALT: 29 U/L (ref 0–44)
AST: 27 U/L (ref 15–41)
Albumin: 4.3 g/dL (ref 3.5–5.0)
Alkaline Phosphatase: 132 U/L — ABNORMAL HIGH (ref 38–126)
Anion gap: 12 (ref 5–15)
BUN: 40 mg/dL — ABNORMAL HIGH (ref 8–23)
CO2: 22 mmol/L (ref 22–32)
Calcium: 9.7 mg/dL (ref 8.9–10.3)
Chloride: 105 mmol/L (ref 98–111)
Creatinine, Ser: 1.52 mg/dL — ABNORMAL HIGH (ref 0.61–1.24)
GFR calc Af Amer: 48 mL/min — ABNORMAL LOW (ref >60)
GFR calc non Af Amer: 41 mL/min — ABNORMAL LOW (ref >60)
Glucose, Bld: 126 mg/dL — ABNORMAL HIGH (ref 70–99)
Potassium: 5.3 mmol/L — ABNORMAL HIGH (ref 3.5–5.1)
Sodium: 139 mmol/L (ref 135–145)
Total Bilirubin: 0.9 mg/dL (ref 0.3–1.2)
Total Protein: 7.8 g/dL (ref 6.5–8.1)

## 2020-02-28 LAB — VITAMIN D 25 HYDROXY (VIT D DEFICIENCY, FRACTURES): Vit D, 25-Hydroxy: 46.41 ng/mL (ref 30–100)

## 2020-02-28 LAB — IRON AND TIBC
Iron: 43 ug/dL — ABNORMAL LOW (ref 45–182)
Saturation Ratios: 14 % — ABNORMAL LOW (ref 17.9–39.5)
TIBC: 317 ug/dL (ref 250–450)
UIBC: 274 ug/dL

## 2020-02-28 LAB — FERRITIN: Ferritin: 345 ng/mL — ABNORMAL HIGH (ref 24–336)

## 2020-02-28 LAB — FOLATE: Folate: 13.8 ng/mL (ref >5.9)

## 2020-02-28 LAB — VITAMIN B12: Vitamin B-12: 1950 pg/mL — ABNORMAL HIGH (ref 180–914)

## 2020-02-28 LAB — LACTATE DEHYDROGENASE: LDH: 151 U/L (ref 98–192)

## 2020-02-29 ENCOUNTER — Encounter: Payer: Medicare Other | Admitting: Nutrition

## 2020-03-01 ENCOUNTER — Other Ambulatory Visit: Payer: Self-pay

## 2020-03-01 ENCOUNTER — Inpatient Hospital Stay (HOSPITAL_BASED_OUTPATIENT_CLINIC_OR_DEPARTMENT_OTHER): Payer: Medicare Other | Admitting: Nurse Practitioner

## 2020-03-01 DIAGNOSIS — I82441 Acute embolism and thrombosis of right tibial vein: Secondary | ICD-10-CM | POA: Diagnosis not present

## 2020-03-01 DIAGNOSIS — Z87891 Personal history of nicotine dependence: Secondary | ICD-10-CM | POA: Diagnosis not present

## 2020-03-01 DIAGNOSIS — D649 Anemia, unspecified: Secondary | ICD-10-CM | POA: Diagnosis not present

## 2020-03-01 DIAGNOSIS — I10 Essential (primary) hypertension: Secondary | ICD-10-CM | POA: Diagnosis not present

## 2020-03-01 DIAGNOSIS — Z7982 Long term (current) use of aspirin: Secondary | ICD-10-CM | POA: Diagnosis not present

## 2020-03-01 DIAGNOSIS — E119 Type 2 diabetes mellitus without complications: Secondary | ICD-10-CM | POA: Diagnosis not present

## 2020-03-01 NOTE — Assessment & Plan Note (Addendum)
1.  Normocytic anemia: - Thought to be from GI blood loss. -He had a hemoglobin of 9.7 in September 2019 followed by a GI work-up. -He had an EGD on 05/18/2018 showed erosive reflux esophagitis, erosive gastropathy.  Biopsy was consistent with chronic active gastritis with H. pylori.  It was thought that he could have been bleeding from upper GI. -Colonoscopy on 05/18/2018 showed diverticulosis of the sigmoid colon, descending colon and transverse colon.  Tubular adenoma from hepatic flexure was resected. - He was admitted to the hospital on 09/02/2017 and diagnosed with a possible nonocclusive thrombus of the determinate acuity within 1 of the paired posterior tibial veins in the right calf.  He was started on Eliquis.  It was also found that he had a hemoglobin of 7.7.  Ferritin was 75 and percent saturation was 9. -He denies any history of blood transfusions.  Denies any B symptoms. - For his work-up his CBC was repeated hemoglobin showed 10.3.  Stool for occult blood x3 was negative.  Direct Coombs test and LDH were normal.  A12 and folic acid were normal. - His last Feraheme infusion was on 10/06/2019 and 10/13/2019 - Labs on 02/28/2020 showed hemoglobin 11.2, ferritin 345, percent saturation 14, platelets 180 -He does not need any iron transfusions at this time. - He will follow-up in 4 months with repeat labs.  2.  Distal DVT: - He had an ultrasound on 09/03/2018 showed possible nonocclusive thrombus of the intermediate acuity within 1 of the posterior tibial veins in the right calf. - He is currently on Eliquis 5 mg twice daily and tolerating well.  That was started in February 2019. - A CT scan of the chest PE protocol could not be done as he is unable to lie flat at that time. -He reports he is now able to lie flat and take deep breaths. -He is no longer taking Eliquis.  He reports it was stopped after 6 months.

## 2020-03-01 NOTE — Progress Notes (Signed)
[place], [place] [address]   CLINIC:  Medical Oncology/Hematology  PCP:  Kathyrn Drown, MD [address] [place] [address] [phone]   REASON FOR VISIT: Follow-up for normocytic anemia   CURRENT THERAPY: Intermittent iron infusions   INTERVAL HISTORY:  Jerry Burns 86 y.o. male returns for routine follow-up for normocytic anemia.  Patient reports he is doing well since his last visit.  He denies any bright red bleeding per rectum or melena.  Denies any easy bruising or bleeding. Denies any nausea, vomiting, or diarrhea. Denies any new pains. Had not noticed any recent bleeding such as epistaxis, hematuria or hematochezia. Denies recent chest pain on exertion, shortness of breath on minimal exertion, pre-syncopal episodes, or palpitations. Denies any numbness or tingling in hands or feet. Denies any recent fevers, infections, or recent hospitalizations. Patient reports appetite at 100% and energy level at 75%.  He is eating well maintain his weight this time.    REVIEW OF SYSTEMS:  Review of Systems  Gastrointestinal: Positive for constipation.  Psychiatric/Behavioral: Positive for sleep disturbance.  All other systems reviewed and are negative.    PAST MEDICAL/SURGICAL HISTORY:  Past Medical History:  Diagnosis Date  . Anginal pain ([place])   . Arthritis    Bilateral knee  . CHF (congestive heart failure) ([place])   . Coronary artery disease   . Diabetes mellitus    insulin dependent   . DVT, lower extremity, distal, acute ([place]) 09/09/2018  . Hyperlipidemia   . Hypertension   . Progressive angina ([place]) 03/2015   a. s/p DES to proximal LAD and balloon angioplasty to distal LAD in 2016 b. patent stent by repeat cath in 01/2017 with mild residual CAD)  . Stroke [facility])    Past Surgical History:  Procedure Laterality Date  . APPENDECTOMY    . CARDIAC CATHETERIZATION N/A 04/12/2015   Procedure: Left Heart Cath and Coronary  Angiography;  Surgeon: Sherren Mocha, MD; pLAD 75>0% w/ 3.0x12 mm Resolute DES, dLAD 80>0% w/ Angiosculpt scoring balloon, CFX luminal irreg, RCA mild dz, EF 55-65%   . COLONOSCOPY  2011   RMR: 1. Normal rectum 2. Left sided diverticula , 2 ascending colon polyps, status post snare polypectomy. Remainder of colonic mucosa appeared unremarkable.   . COLONOSCOPY N/A 05/13/2016   Procedure: COLONOSCOPY;  Surgeon: Daneil Dolin, MD;  Location: [facility];  Service: Endoscopy;  Laterality: N/A;  7:30 AM  . COLONOSCOPY N/A 05/18/2018   Rourk: Diverticulosis, single tubular adenoma removed.  No plans for future screening or surveillance colonoscopy due to age.  . CORONARY ANGIOGRAPHY N/A 02/04/2017   Procedure: CORONARY ANGIOGRAPHY (CATH LAB);  Surgeon: [place] Sine, MD;  Location: [facility] CV LAB;  Service: Cardiovascular;  Laterality: N/A;  . CORONARY ANGIOPLASTY WITH STENT PLACEMENT  08/22/2014   PTCA/DES X mLAD with 2.5 x 16 mm Promus Premier DES by Dr Julianne Handler  . ESOPHAGOGASTRODUODENOSCOPY N/A 05/18/2018   [place]k: Ulcerative reflux esophagitis, hiatal hernia, erosive gastropathy with H. pylori on biopsy, also in the setting of NSAID use.  Marland Kitchen FOOT SURGERY Right   . LEFT HEART CATHETERIZATION WITH CORONARY ANGIOGRAM N/A 08/22/2014   Procedure: LEFT HEART CATHETERIZATION WITH CORONARY ANGIOGRAM;  Surgeon: Burnell Blanks, MD;  Location: [facility] CATH LAB;  Service: Cardiovascular;  Laterality: N/A;  . PERCUTANEOUS CORONARY STENT INTERVENTION (PCI-S)  08/22/2014   Procedure: PERCUTANEOUS CORONARY STENT INTERVENTION (PCI-S);  Surgeon: Burnell Blanks, MD;   .  SHOULDER SURGERY    . TONSILLECTOMY       SOCIAL HISTORY:  Social History   Socioeconomic History  . Marital status: Married    Spouse name: Not on file  . Number of children: Not on file  . Years of education: 72  . Highest education level: Not on file  Occupational History  . Occupation: retired  Tobacco Use  .  Smoking status: Former Smoker    Packs/day: 1.00    Years: 20.00    Pack years: 20.00    Types: Cigarettes, Cigars    Start date: 08/10/1955    Quit date: 07/28/2007    Years since quitting: 12.6  . Smokeless tobacco: Never Used  Vaping Use  . Vaping Use: Never used  Substance and Sexual Activity  . Alcohol use: Not Currently    Alcohol/week: 0.0 standard drinks    Comment: Daily about 1 glass of wine  . Drug use: No  . Sexual activity: Not Currently  Other Topics Concern  . Not on file  Social History Narrative  . Not on file   Social Determinants of Health   Financial Resource Strain:   . Difficulty of Paying Living Expenses:   Food Insecurity:   . Worried About Charity fundraiser in the Last Year:   . Arboriculturist in the Last Year:   Transportation Needs:   . Film/video editor (Medical):   Marland Kitchen Lack of Transportation (Non-Medical):   Physical Activity:   . Days of Exercise per Week:   . Minutes of Exercise per Session:   Stress:   . Feeling of Stress :   Social Connections:   . Frequency of Communication with Friends and Family:   . Frequency of Social Gatherings with Friends and Family:   . Attends Religious Services:   . Active Member of Clubs or Organizations:   . Attends Archivist Meetings:   Marland Kitchen Marital Status:   Intimate Partner Violence:   . Fear of Current or Ex-Partner:   . Emotionally Abused:   Marland Kitchen Physically Abused:   . Sexually Abused:     FAMILY HISTORY:  Family History  Problem Relation Age of Onset  . Diabetes Other   . Diabetes Brother   . Colon cancer Maternal Uncle   . Skin cancer Mother   . Diabetes Father   . Hypertension Sister   . Skin cancer Sister     CURRENT MEDICATIONS:  Outpatient Encounter Medications as of 03/01/2020  Medication Sig Note  . amLODipine (NORVASC) 2.5 MG tablet Take 1 tablet (2.5 mg total) by mouth daily.   . Ascorbic Acid (VITAMIN C) 1000 MG tablet Take 500 mg by mouth in the morning and at  bedtime.    Marland Kitchen aspirin EC 81 MG tablet Take 81 mg by mouth daily.   Marland Kitchen atorvastatin (LIPITOR) 80 MG tablet TAKE (1) TABLET BY MOUTH ONCE DAILY.   . carvedilol (COREG) 12.5 MG tablet Take 1 tablet (12.5 mg total) by mouth 2 (two) times daily.   . COMBIGAN 0.2-0.5 % ophthalmic solution Place 1 drop into the left eye 2 (two) times daily.    . Cyanocobalamin (B-12) 2500 MCG TABS Take 1 tablet by mouth 3 (three) times a week. Three times a week on Monday Wednesday and Friday    . Emollient (CERAVE) CREA Apply 1 application topically daily at 6 (six) AM.    . empagliflozin (JARDIANCE) 10 MG TABS tablet Take 10 mg by mouth daily.   Marland Kitchen  erythromycin ophthalmic ointment Place 1 application into the left eye 3 (three) times daily as needed (infection).    . famotidine (PEPCID) 20 MG tablet Take one tablet po daily   . Flaxseed, Linseed, (FLAXSEED OIL PO) Take 1 capsule by mouth daily.    Marland Kitchen glucose blood (CONTOUR NEXT TEST) test strip USE 1 STRIP TO CHECK GLUCOSE THREE TIMES DAILY. FOR ICD 10 E11.9   . insulin glargine (LANTUS) 100 unit/mL SOPN Inject 20 Units into the skin daily. 02/01/2020: 14 units daily  . insulin lispro (HUMALOG KWIKPEN) 100 UNIT/ML KwikPen Inject twice daily per following: Breakfast 2 units; if 150-250 then 3 units; if greater than 250 5 units. Supper-3 units but if 150-250 do 4 units; if greater that 250 6 units (Patient taking differently: Inject 2-4 Units into the skin See admin instructions. Inject 2 units at breakfast and 4 units at supper daily) 11/26/2019: Patient took 2 units on 11/26/19 at 0500  . Lactobacillus (PROBIOTIC ACIDOPHILUS) CAPS Take 1 capsule by mouth daily at 6 (six) AM.   . lansoprazole (PREVACID) 30 MG capsule Take 30 mg by mouth 2 (two) times daily.   Marland Kitchen linaclotide (LINZESS) 72 MCG capsule Take 1 capsule (72 mcg total) by mouth daily before breakfast.   . Multiple Vitamin (MULTIVITAMIN) tablet Take 1 tablet by mouth daily.   . Polyvinyl Alcohol-Povidone (REFRESH OP)  Place 1 drop into both ears 2 (two) times daily as needed (dryness).    . sitaGLIPtin (JANUVIA) 100 MG tablet TAKE (1) TABLET BY MOUTH ONCE DAILY.   . tamsulosin (FLOMAX) 0.4 MG CAPS capsule TAKE (1) CAPSULE BY MOUTH ONCE DAILY. (Patient taking differently: Take 0.4 mg by mouth daily. )   . torsemide (DEMADEX) 20 MG tablet 1 to 2 qam as directed for swelling in the legs (Patient taking differently: 1 to 2 qam as directed for swelling in the legs as needed)   . Turmeric 500 MG CAPS Take 500 mg by mouth daily.   . mometasone (ELOCON) 0.1 % cream APPLY TO AFFECTED AREAS ONCE DAILY. (Patient not taking: Reported on 03/01/2020) 11/26/2019: Patient has if needed but has not had to take recently  . nitroGLYCERIN (NITROSTAT) 0.4 MG SL tablet PLACE 1 TAB UNDER TONGUE EVERY 5 MIN IF NEEDED FOR CHEST PAIN. MAY USE 3 TIMES.NO RELIEF CALL 911. (Patient not taking: Reported on 03/01/2020) 11/26/2019: Patient has if needed but has not had to take recently   No facility-administered encounter medications on file as of 03/01/2020.    ALLERGIES:  Allergies  Allergen Reactions  . Adhesive [Tape]     bandaids cause blisters after leaving on for 24 hours   . Sulfa Antibiotics Other (See Comments)    "was terrible" "eye went crazy"  . Latex Other (See Comments)    blisters     PHYSICAL EXAM:  ECOG Performance status: 1  Vitals:   03/01/20 0840  BP: 125/66  Resp: 18  Temp: (!) 97.2 F (36.2 C)  SpO2: 97%   Filed Weights   03/01/20 0840  Weight: 204 lb 3.2 oz (92.6 kg)   Physical Exam Constitutional:      Appearance: Normal appearance. He is normal weight.  Cardiovascular:     Rate and Rhythm: Normal rate and regular rhythm.     Heart sounds: Normal heart sounds.  Pulmonary:     Effort: Pulmonary effort is normal.     Breath sounds: Normal breath sounds.  Abdominal:     General: Bowel sounds are normal.  Palpations: Abdomen is soft.  Musculoskeletal:        General: Normal range of motion.    Skin:    General: Skin is warm.  Neurological:     Mental Status: He is alert and oriented to person, place, and time. Mental status is at baseline.  Psychiatric:        Mood and Affect: Mood normal.        Behavior: Behavior normal.        Thought Content: Thought content normal.        Judgment: Judgment normal.      LABORATORY DATA:  I have reviewed the labs as listed.  CBC    Component Value Date/Time   WBC 6.8 02/28/2020 1019   RBC 3.85 (L) 02/28/2020 1019   HGB 11.2 (L) 02/28/2020 1019   HGB 10.2 (L) 12/11/2019 1037   HCT 35.8 (L) 02/28/2020 1019   HCT 30.7 (L) 12/11/2019 1037   PLT 180 02/28/2020 1019   PLT 264 12/11/2019 1037   MCV 93.0 02/28/2020 1019   MCV 94 12/11/2019 1037   MCH 29.1 02/28/2020 1019   MCHC 31.3 02/28/2020 1019   RDW 16.8 (H) 02/28/2020 1019   RDW 12.4 12/11/2019 1037   LYMPHSABS 0.7 02/28/2020 1019   LYMPHSABS 0.8 12/11/2019 1037   MONOABS 0.8 02/28/2020 1019   EOSABS 0.3 02/28/2020 1019   EOSABS 0.3 12/11/2019 1037   BASOSABS 0.1 02/28/2020 1019   BASOSABS 0.1 12/11/2019 1037   CMP Latest Ref Rng & Units 02/28/2020 02/13/2020 01/22/2020  Glucose 70 - 99 mg/dL 126(H) 67 139(H)  BUN 8 - 23 mg/dL 40(H) 46(H) 36(H)  Creatinine 0.61 - 1.24 mg/dL 1.52(H) 1.89(H) 1.65(H)  Sodium 135 - 145 mmol/L 139 141 138  Potassium 3.5 - 5.1 mmol/L 5.3(H) 5.2 4.7  Chloride 98 - 111 mmol/L 105 103 101  CO2 22 - 32 mmol/L 22 24 23   Calcium 8.9 - 10.3 mg/dL 9.7 9.6 9.2  Total Protein 6.5 - 8.1 g/dL 7.8 - -  Total Bilirubin 0.3 - 1.2 mg/dL 0.9 - -  Alkaline Phos 38 - 126 U/L 132(H) - -  AST 15 - 41 U/L 27 - -  ALT 0 - 44 U/L 29 - -    All questions were answered to patient's stated satisfaction. Encouraged patient to call with any new concerns or questions before his next visit to the cancer center and we can certain see him sooner, if needed.     ASSESSMENT & PLAN:  Normochromic normocytic anemia 1.  Normocytic anemia: - Thought to be from GI blood  loss. -He had a hemoglobin of 9.7 in September 2019 followed by a GI work-up. -He had an EGD on 05/18/2018 showed erosive reflux esophagitis, erosive gastropathy.  Biopsy was consistent with chronic active gastritis with H. pylori.  It was thought that he could have been bleeding from upper GI. -Colonoscopy on 05/18/2018 showed diverticulosis of the sigmoid colon, descending colon and transverse colon.  Tubular adenoma from hepatic flexure was resected. - He was admitted to the hospital on 09/02/2017 and diagnosed with a possible nonocclusive thrombus of the determinate acuity within 1 of the paired posterior tibial veins in the right calf.  He was started on Eliquis.  It was also found that he had a hemoglobin of 7.7.  Ferritin was 75 and percent saturation was 9. -He denies any history of blood transfusions.  Denies any B symptoms. - For his work-up his CBC was repeated hemoglobin showed 10.3.  Stool for occult blood x3 was negative.  Direct Coombs test and LDH were normal.  A12 and folic acid were normal. - His last Feraheme infusion was on 10/06/2019 and 10/13/2019 - Labs on 02/28/2020 showed hemoglobin 11.2, ferritin 345, percent saturation 14, platelets 180 -He does not need any iron transfusions at this time. - He will follow-up in 4 months with repeat labs.  2.  Distal DVT: - He had an ultrasound on 09/03/2018 showed possible nonocclusive thrombus of the intermediate acuity within 1 of the posterior tibial veins in the right calf. - He is currently on Eliquis 5 mg twice daily and tolerating well.  That was started in February 2019. - A CT scan of the chest PE protocol could not be done as he is unable to lie flat at that time. -He reports he is now able to lie flat and take deep breaths. -He is no longer taking Eliquis.  He reports it was stopped after 6 months.     Orders placed this encounter:  Orders Placed This Encounter  Procedures  . Lactate dehydrogenase  . CBC with  Differential/Platelet  . Comprehensive metabolic panel  . Ferritin  . Iron and TIBC  . Vitamin B12  . VITAMIN D 25 Hydroxy (Vit-D Deficiency, Fractures)  . Folate      Francene Finders, FNP-C [place] [phone]

## 2020-03-05 DIAGNOSIS — D631 Anemia in chronic kidney disease: Secondary | ICD-10-CM | POA: Diagnosis not present

## 2020-03-05 DIAGNOSIS — E785 Hyperlipidemia, unspecified: Secondary | ICD-10-CM | POA: Diagnosis not present

## 2020-03-05 DIAGNOSIS — I5030 Unspecified diastolic (congestive) heart failure: Secondary | ICD-10-CM | POA: Diagnosis not present

## 2020-03-05 DIAGNOSIS — E1122 Type 2 diabetes mellitus with diabetic chronic kidney disease: Secondary | ICD-10-CM | POA: Diagnosis not present

## 2020-03-05 DIAGNOSIS — Z86718 Personal history of other venous thrombosis and embolism: Secondary | ICD-10-CM | POA: Diagnosis not present

## 2020-03-05 DIAGNOSIS — N1832 Chronic kidney disease, stage 3b: Secondary | ICD-10-CM | POA: Diagnosis not present

## 2020-03-05 DIAGNOSIS — N400 Enlarged prostate without lower urinary tract symptoms: Secondary | ICD-10-CM | POA: Diagnosis not present

## 2020-03-05 DIAGNOSIS — I129 Hypertensive chronic kidney disease with stage 1 through stage 4 chronic kidney disease, or unspecified chronic kidney disease: Secondary | ICD-10-CM | POA: Diagnosis not present

## 2020-03-05 DIAGNOSIS — I2510 Atherosclerotic heart disease of native coronary artery without angina pectoris: Secondary | ICD-10-CM | POA: Diagnosis not present

## 2020-03-06 ENCOUNTER — Ambulatory Visit (HOSPITAL_COMMUNITY): Payer: Medicare Other | Admitting: Nurse Practitioner

## 2020-03-12 ENCOUNTER — Other Ambulatory Visit: Payer: Self-pay | Admitting: Family Medicine

## 2020-03-28 ENCOUNTER — Other Ambulatory Visit: Payer: Self-pay | Admitting: Student

## 2020-03-29 NOTE — Telephone Encounter (Signed)
This is a  pt.  °

## 2020-04-04 ENCOUNTER — Ambulatory Visit: Payer: Medicare Other | Admitting: Cardiovascular Disease

## 2020-04-04 IMAGING — DX DG CHEST 2V
2 series · 2 of 2 positions shown · non-contrast
Comparison: 02/02/2017

CLINICAL DATA: Shortness of breath and cough

EXAM:
CHEST - 2 VIEW

[chest pa]
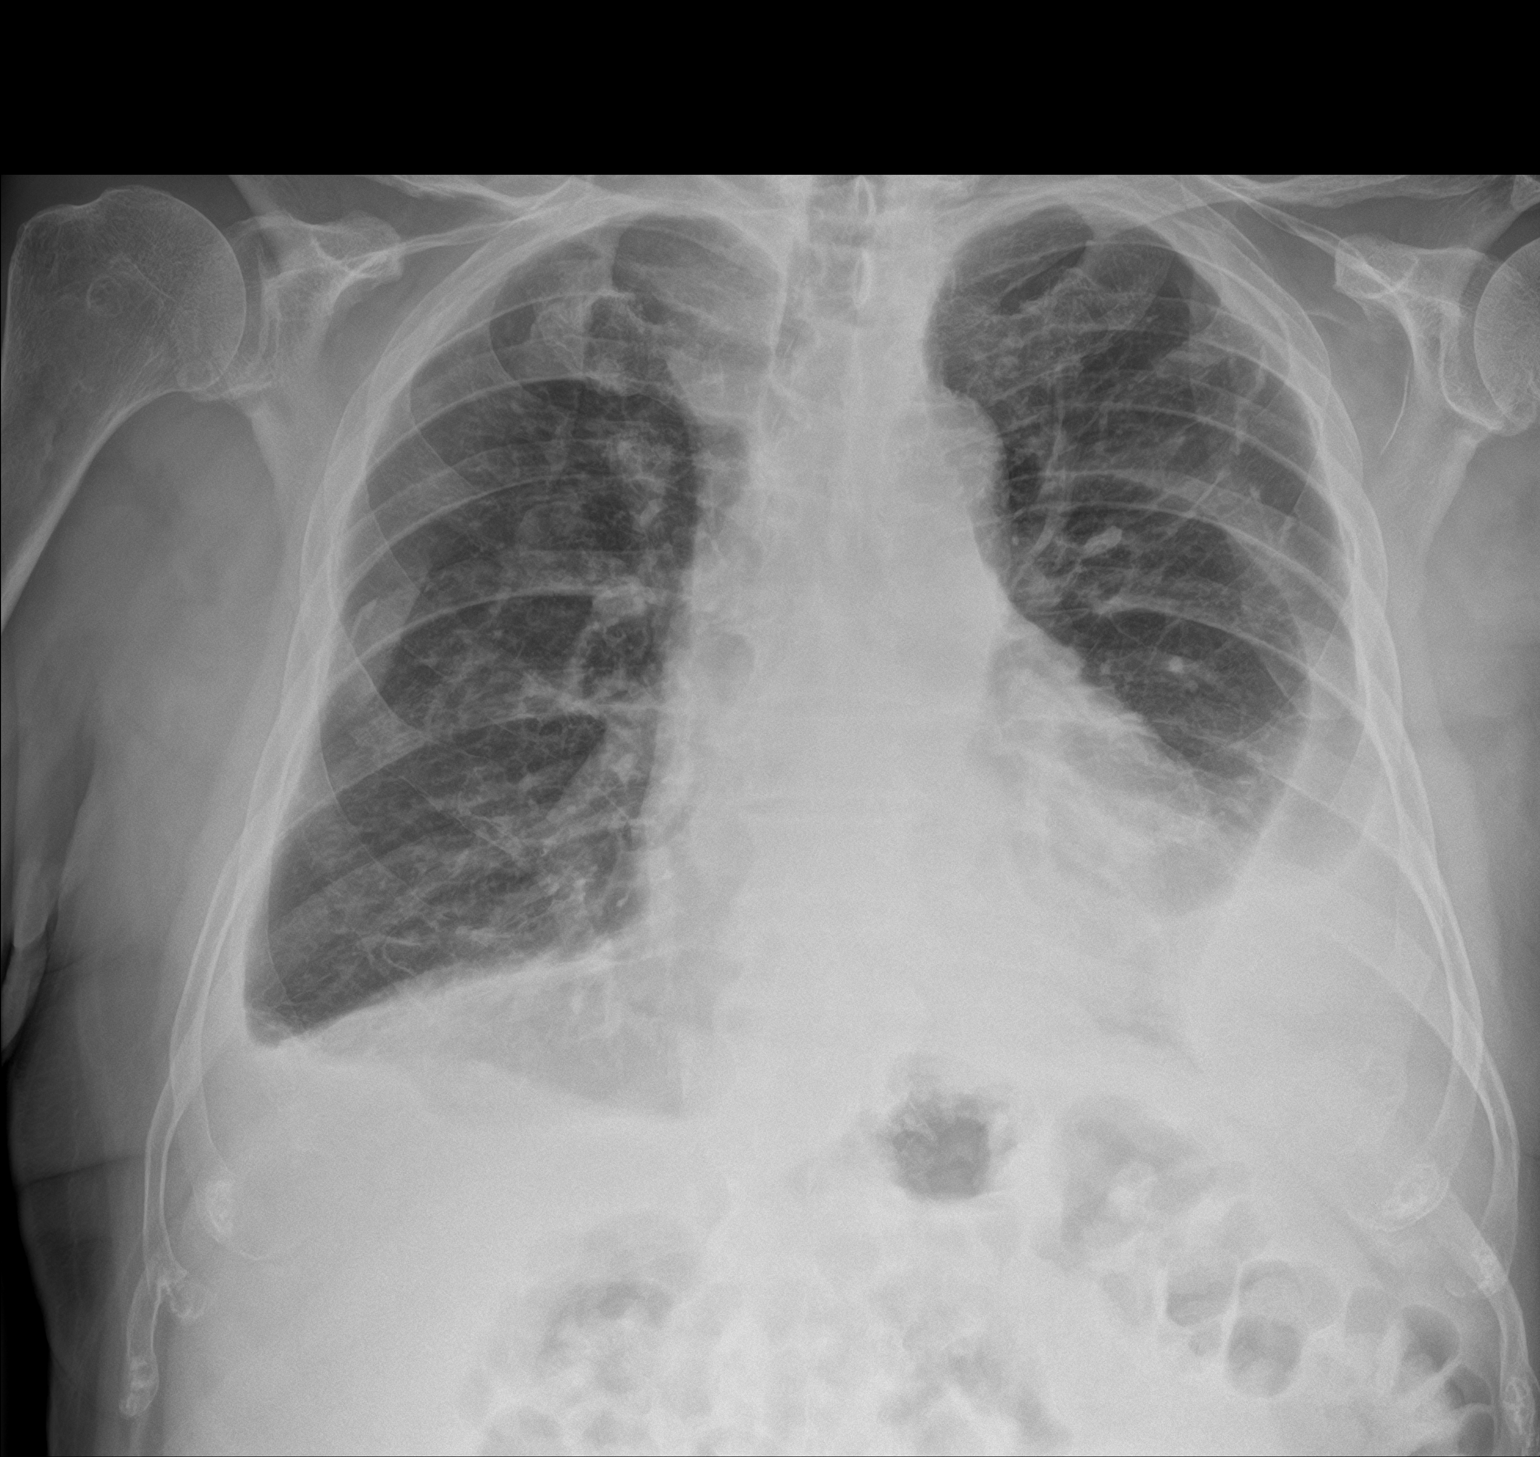

[chest lat]
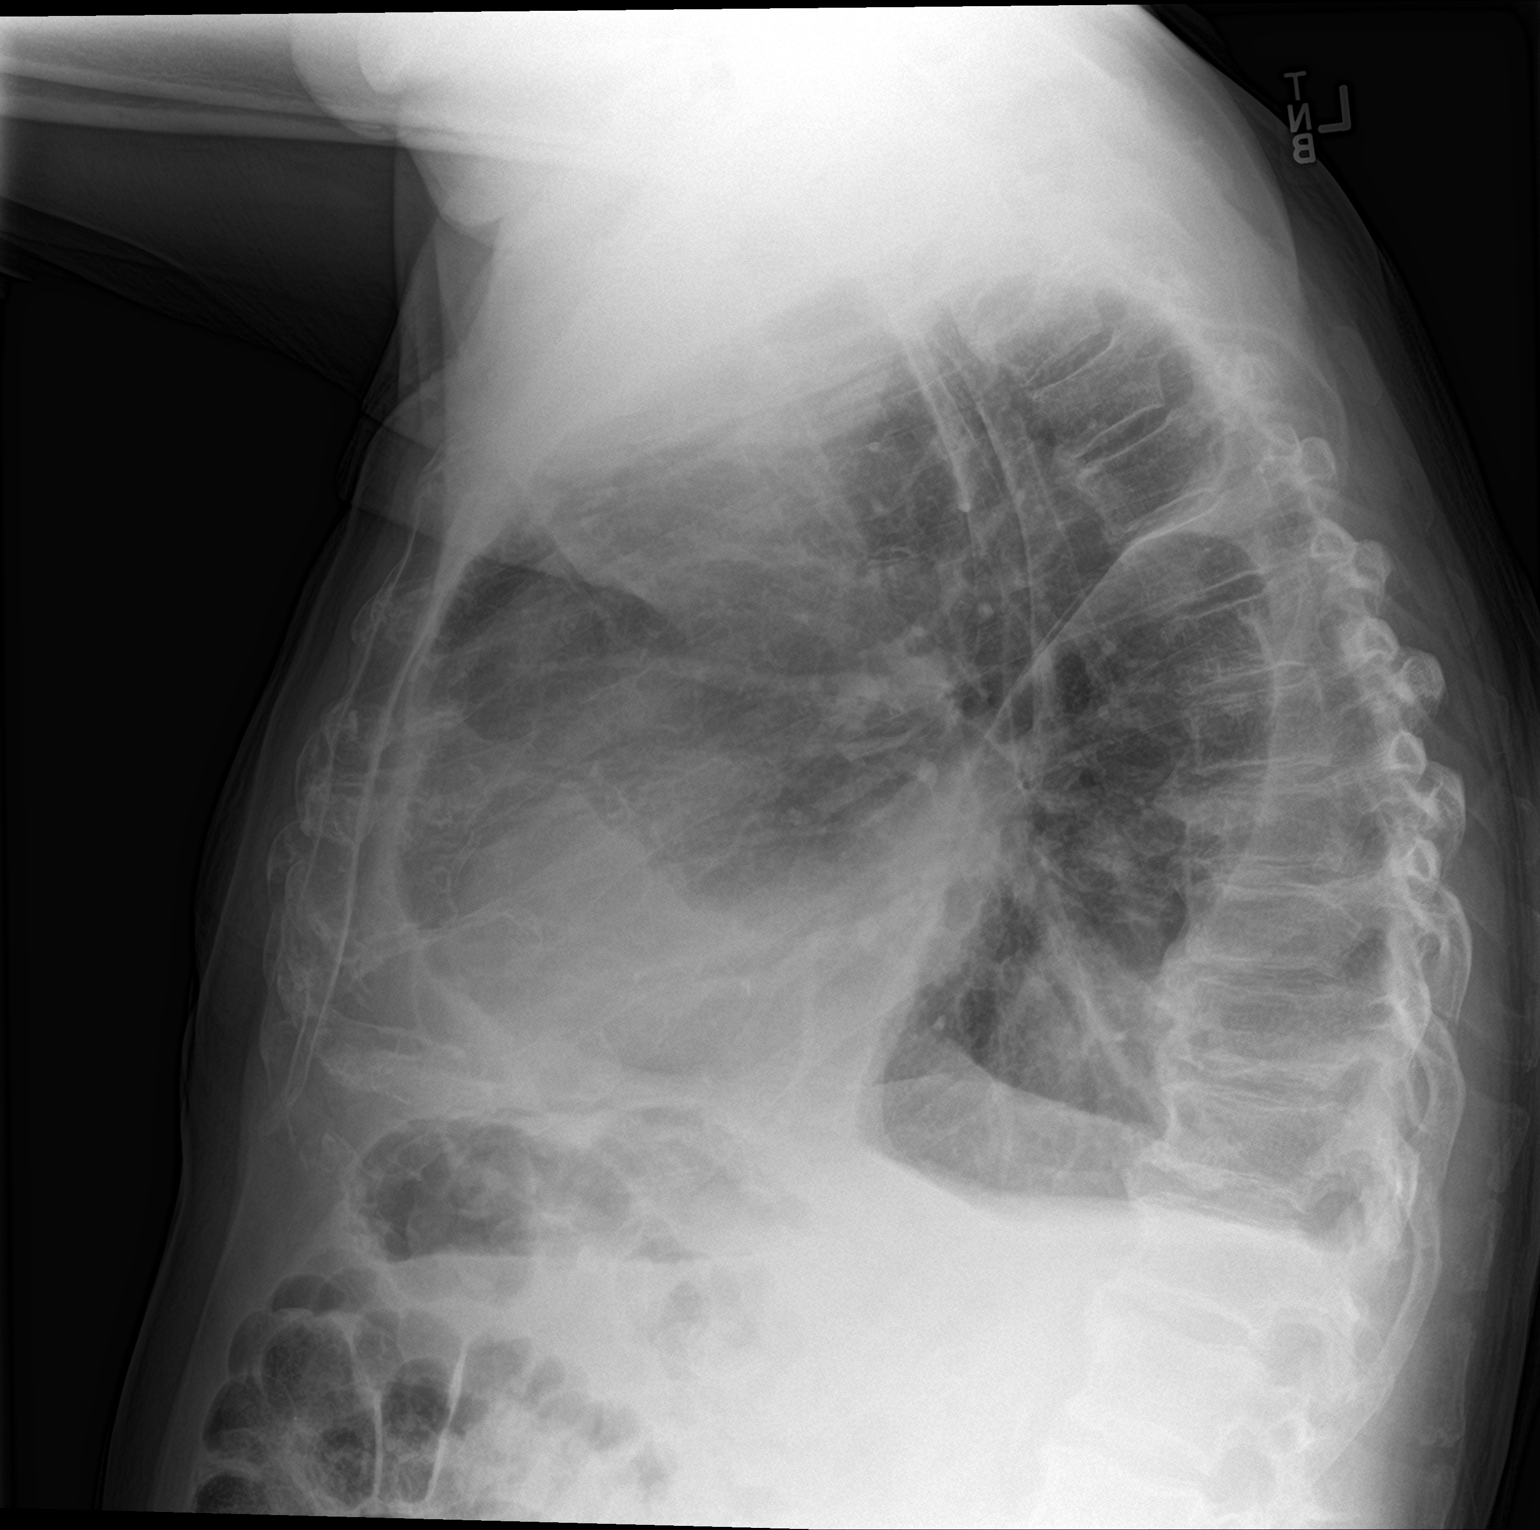

[2 of 2 positions shown; findings below may reference images not displayed]

FINDINGS: Cardiac shadow is stable. The lungs are well aerated bilaterally.
Bilateral pleural effusions are noted left greater than right.
Degenerative changes of the thoracic spine are noted.
IMPRESSION: Bilateral pleural effusions left greater than right.

## 2020-04-05 ENCOUNTER — Other Ambulatory Visit: Payer: Self-pay | Admitting: Family Medicine

## 2020-04-09 ENCOUNTER — Ambulatory Visit (INDEPENDENT_AMBULATORY_CARE_PROVIDER_SITE_OTHER): Payer: Medicare Other | Admitting: Cardiology

## 2020-04-09 ENCOUNTER — Encounter: Payer: Self-pay | Admitting: Cardiology

## 2020-04-09 ENCOUNTER — Other Ambulatory Visit: Payer: Self-pay

## 2020-04-09 VITALS — BP 132/76 | HR 70 | Ht 72.0 in | Wt 205.0 lb

## 2020-04-09 DIAGNOSIS — I3139 Other pericardial effusion (noninflammatory): Secondary | ICD-10-CM

## 2020-04-09 DIAGNOSIS — I313 Pericardial effusion (noninflammatory): Secondary | ICD-10-CM

## 2020-04-09 DIAGNOSIS — I2510 Atherosclerotic heart disease of native coronary artery without angina pectoris: Secondary | ICD-10-CM

## 2020-04-09 DIAGNOSIS — Z23 Encounter for immunization: Secondary | ICD-10-CM | POA: Diagnosis not present

## 2020-04-09 DIAGNOSIS — I5032 Chronic diastolic (congestive) heart failure: Secondary | ICD-10-CM | POA: Diagnosis not present

## 2020-04-09 NOTE — Patient Instructions (Signed)
Medication Instructions:  Your physician recommends that you continue on your current medications as directed. Please refer to the Current Medication list given to you today.  *If you need a refill on your cardiac medications before your next appointment, please call your pharmacy*   Lab Work: None today If you have labs (blood work) drawn today and your tests are completely normal, you will receive your results only by: . MyChart Message (if you have MyChart) OR . A paper copy in the mail If you have any lab test that is abnormal or we need to change your treatment, we will call you to review the results.   Testing/Procedures: None today   Follow-Up: At [facility], you and your health needs are our priority.  As part of our continuing mission to provide you with exceptional heart care, we have created designated Provider Care Teams.  These Care Teams include your primary Cardiologist (physician) and Advanced Practice Providers (APPs -  Physician Assistants and Nurse Practitioners) who all work together to provide you with the care you need, when you need it.  We recommend signing up for the patient portal called "MyChart".  Sign up information is provided on this After Visit Summary.  MyChart is used to connect with patients for Virtual Visits (Telemedicine).  Patients are able to view lab/test results, encounter notes, upcoming appointments, etc.  Non-urgent messages can be sent to your provider as well.   To learn more about what you can do with MyChart, go to [email or web address].    Your next appointment:   6 month(s)  The format for your next appointment:   In Person  Provider:   Jonathan Branch, MD   Other Instructions None       Thank you for choosing [facility] !         

## 2020-04-09 NOTE — Progress Notes (Signed)
Clinical Summary Mr. Frith is a 86 y.o.male seen today for follow up of the following medical problems.   1. CAD  - prior DES to prox LAD in 2016, repeat cath 2018 with patent stent - no recent chest pain - compliant with meds   2. Chronic diastolic HF - 02/9380 echo LVEF 55-60%, normal RV function, indet DDx. Small to mod pericardial effusion - no recent edema.   - was to take torsemide 20mg  daily - daily weights 200 and stable.  3. Pericardial effusion - small to moderate, no signs of tamponade  4. CKD 3 - renal had stopped diuretic   Has had covid vaccine x2.   Past Medical History:  Diagnosis Date   Anginal pain ([place])    Arthritis    Bilateral knee   CHF (congestive heart failure) ([place])    Coronary artery disease    Diabetes mellitus    insulin dependent    DVT, lower extremity, distal, acute ([place]) 09/09/2018   Hyperlipidemia    Hypertension    Progressive angina ([place]) 03/2015   a. s/p DES to proximal LAD and balloon angioplasty to distal LAD in 2016 b. patent stent by repeat cath in 01/2017 with mild residual CAD)   Stroke [facility])      Allergies  Allergen Reactions   Adhesive [Tape]     bandaids cause blisters after leaving on for 24 hours    Sulfa Antibiotics Other (See Comments)    "was terrible" "eye went crazy"   Latex Other (See Comments)    blisters     Current Outpatient Medications  Medication Sig Dispense Refill   amLODipine (NORVASC) 2.5 MG tablet TAKE (1) TABLET BY MOUTH ONCE DAILY. 90 tablet 0   Ascorbic Acid (VITAMIN C) 1000 MG tablet Take 500 mg by mouth in the morning and at bedtime.      aspirin EC 81 MG tablet Take 81 mg by mouth daily.     atorvastatin (LIPITOR) 80 MG tablet TAKE (1) TABLET BY MOUTH ONCE DAILY. 90 tablet 1   carvedilol (COREG) 12.5 MG tablet Take 1 tablet (12.5 mg total) by mouth 2 (two) times daily. 180 tablet 3   COMBIGAN 0.2-0.5 % ophthalmic solution Place 1 drop into the left eye 2 (two)  times daily.   2   Cyanocobalamin (B-12) 2500 MCG TABS Take 1 tablet by mouth 3 (three) times a week. Three times a week on Monday Wednesday and Friday      Emollient (CERAVE) CREA Apply 1 application topically daily at 6 (six) AM.      empagliflozin (JARDIANCE) 10 MG TABS tablet Take 10 mg by mouth daily.     erythromycin ophthalmic ointment Place 1 application into the left eye 3 (three) times daily as needed (infection).      famotidine (PEPCID) 20 MG tablet Take one tablet po daily 90 tablet 1   Flaxseed, Linseed, (FLAXSEED OIL PO) Take 1 capsule by mouth daily.      glucose blood (CONTOUR NEXT TEST) test strip USE 1 STRIP TO CHECK GLUCOSE THREE TIMES DAILY 300 each 0   insulin glargine (LANTUS) 100 unit/mL SOPN Inject 20 Units into the skin daily.     insulin lispro (HUMALOG KWIKPEN) 100 UNIT/ML KwikPen Inject twice daily per following: Breakfast 2 units; if 150-250 then 3 units; if greater than 250 5 units. Supper-3 units but if 150-250 do 4 units; if greater that 250 6 units (Patient taking differently: Inject 2-4 Units into  the skin See admin instructions. Inject 2 units at breakfast and 4 units at supper daily) 15 mL 5   Lactobacillus (PROBIOTIC ACIDOPHILUS) CAPS Take 1 capsule by mouth daily at 6 (six) AM.     lansoprazole (PREVACID) 30 MG capsule Take 30 mg by mouth 2 (two) times daily.     linaclotide (LINZESS) 72 MCG capsule Take 1 capsule (72 mcg total) by mouth daily before breakfast. 30 capsule 3   mometasone (ELOCON) 0.1 % cream APPLY TO AFFECTED AREAS ONCE DAILY. (Patient not taking: Reported on 03/01/2020) 45 g 3   Multiple Vitamin (MULTIVITAMIN) tablet Take 1 tablet by mouth daily.     nitroGLYCERIN (NITROSTAT) 0.4 MG SL tablet PLACE 1 TAB UNDER TONGUE EVERY 5 MIN IF NEEDED FOR CHEST PAIN. MAY USE 3 TIMES.NO RELIEF CALL 911. 25 tablet 2   Polyvinyl Alcohol-Povidone (REFRESH OP) Place 1 drop into both ears 2 (two) times daily as needed (dryness).      sitaGLIPtin  (JANUVIA) 100 MG tablet TAKE (1) TABLET BY MOUTH ONCE DAILY. 30 tablet 5   tamsulosin (FLOMAX) 0.4 MG CAPS capsule TAKE (1) CAPSULE BY MOUTH ONCE DAILY. (Patient taking differently: Take 0.4 mg by mouth daily. ) 90 capsule 0   torsemide (DEMADEX) 20 MG tablet 1 to 2 qam as directed for swelling in the legs (Patient taking differently: 1 to 2 qam as directed for swelling in the legs as needed) 60 tablet 4   Turmeric 500 MG CAPS Take 500 mg by mouth daily.     No current facility-administered medications for this visit.     Past Surgical History:  Procedure Laterality Date   APPENDECTOMY     CARDIAC CATHETERIZATION N/A 04/12/2015   Procedure: Left Heart Cath and Coronary Angiography;  Surgeon: Sherren Mocha, MD; pLAD 75>0% w/ 3.0x12 mm Resolute DES, dLAD 80>0% w/ Angiosculpt scoring balloon, CFX luminal irreg, RCA mild dz, EF 55-65%    COLONOSCOPY  2011   RMR: 1. Normal rectum 2. Left sided diverticula , 2 ascending colon polyps, status post snare polypectomy. Remainder of colonic mucosa appeared unremarkable.    COLONOSCOPY N/A 05/13/2016   Procedure: COLONOSCOPY;  Surgeon: Daneil Dolin, MD;  Location: [facility];  Service: Endoscopy;  Laterality: N/A;  7:30 AM   COLONOSCOPY N/A 05/18/2018   Rourk: Diverticulosis, single tubular adenoma removed.  No plans for future screening or surveillance colonoscopy due to age.   CORONARY ANGIOGRAPHY N/A 02/04/2017   Procedure: CORONARY ANGIOGRAPHY (CATH LAB);  Surgeon: Troy Sine, MD;  Location: [place] CV LAB;  Service: Cardiovascular;  Laterality: N/A;   CORONARY ANGIOPLASTY WITH STENT PLACEMENT  08/22/2014   PTCA/DES X mLAD with 2.5 x 16 mm Promus Premier DES by Dr Julianne Handler   ESOPHAGOGASTRODUODENOSCOPY N/A 05/18/2018   Rourk: Ulcerative reflux esophagitis, hiatal hernia, erosive gastropathy with H. pylori on biopsy, also in the setting of NSAID use.   FOOT SURGERY Right    LEFT HEART CATHETERIZATION WITH CORONARY ANGIOGRAM  N/A 08/22/2014   Procedure: LEFT HEART CATHETERIZATION WITH CORONARY ANGIOGRAM;  Surgeon: Burnell Blanks, MD;  Location: [facility] CATH LAB;  Service: Cardiovascular;  Laterality: N/A;   PERCUTANEOUS CORONARY STENT INTERVENTION (PCI-S)  08/22/2014   Procedure: PERCUTANEOUS CORONARY STENT INTERVENTION (PCI-S);  Surgeon: Burnell Blanks, MD;    SHOULDER SURGERY     TONSILLECTOMY       Allergies  Allergen Reactions   Adhesive [Tape]     bandaids cause blisters after leaving on for 24 hours  Sulfa Antibiotics Other (See Comments)    "was terrible" "eye went crazy"   Latex Other (See Comments)    blisters      Family History  Problem Relation Age of Onset   Diabetes Other    Diabetes Brother    Colon cancer Maternal Uncle    Skin cancer Mother    Diabetes Father    Hypertension Sister    Skin cancer Sister      Social History Mr. Boehringer reports that he quit smoking about 12 years ago. His smoking use included cigarettes and cigars. He started smoking about 64 years ago. He has a 20.00 pack-year smoking history. He has never used smokeless tobacco. Mr. Boehning reports previous alcohol use.   Review of Systems CONSTITUTIONAL: No weight loss, fever, chills, weakness or fatigue.  HEENT: Eyes: No visual loss, blurred vision, double vision or yellow sclerae.No hearing loss, sneezing, congestion, runny nose or sore throat.  SKIN: No rash or itching.  CARDIOVASCULAR: per hpi RESPIRATORY: No shortness of breath, cough or sputum.  GASTROINTESTINAL: No anorexia, nausea, vomiting or diarrhea. No abdominal pain or blood.  GENITOURINARY: No burning on urination, no polyuria NEUROLOGICAL: No headache, dizziness, syncope, paralysis, ataxia, numbness or tingling in the extremities. No change in bowel or bladder control.  MUSCULOSKELETAL: No muscle, back pain, joint pain or stiffness.  LYMPHATICS: No enlarged nodes. No history of splenectomy.  PSYCHIATRIC: No history of  depression or anxiety.  ENDOCRINOLOGIC: No reports of sweating, cold or heat intolerance. No polyuria or polydipsia.  Marland Kitchen   Physical Examination Today's Vitals   04/09/20 1339  BP: 132/76  Pulse: 70  SpO2: 97%  Weight: 205 lb (93 kg)  Height: 6' (1.829 m)   Body mass index is 27.8 kg/m.  Gen: resting comfortably, no acute distress HEENT: no scleral icterus, pupils equal round and reactive, no palptable cervical adenopathy,  CV: RRR, no m/r/g, no jvd Resp: Clear to auscultation bilaterally GI: abdomen is soft, non-tender, non-distended, normal bowel sounds, no hepatosplenomegaly MSK: extremities are warm, no edema.  Skin: warm, no rash Neuro:  no focal deficits Psych: appropriate affect   Diagnostic Studies  Echocardiogram 12/15/19:  1. Left ventricular ejection fraction, by estimation, is 55%. The left  ventricle has normal function. The left ventricle demonstrates regional  wall motion abnormalities (see scoring diagram/findings for description).  Left ventricular diastolic  parameters are indeterminate.  2. Right ventricular systolic function is normal. The right ventricular  size is normal. There is mildly elevated pulmonary artery systolic  pressure. The estimated right ventricular systolic pressure is 92.4 mmHg.  3. Left atrial size was moderately dilated.  4. Moderate pericardial effusion. The pericardial effusion is  circumferential with anterior predominance. There is evidence of  organization and potential chronicity. No clear right ventricular  compromise or diagnostic variation in mitral outflow to  suggest tamponade physiology.  5. The mitral valve is grossly normal. Mild mitral valve regurgitation.  6. The aortic valve is tricuspid. Aortic valve regurgitation is not  visualized.  7. The inferior vena cava is dilated in size with <50% respiratory  variability, suggesting right atrial pressure of 15 mmHg.   NST: 12/2016  There was no ST segment  deviation noted during stress.  Findings consistent with prior inferior/inferoseptal/inferoapical myocardial infarction with moderate peri-infarct ischemia.  This is an intermediate risk study.  The left ventricular ejection fraction is normal (55-65%).  Cardiac Catheterization: 01/2017  Prox RCA lesion, 15 %stenosed.  Dist RCA lesion, 20 %stenosed.  1st  Diag lesion, 20 %stenosed.  Ost LAD-1 lesion, 40 %stenosed.  Ost LAD-2 lesion, 0 %stenosed.  Dist LAD-1 lesion, 30 %stenosed.  Dist LAD-2 lesion, 15 %stenosed.  Mild residual CAD with a patent proximal LAD stent and 30-40% narrowing immediately proximal to the stented segment, 20% proximal diagonal stenosis, 30% mid LAD stenosis, patent distal LAD stent with 15% intimal hyperplasia; normal left circumflex system; mild 20% proximal and distal stenoses in a dominant RCA.  RECOMMENDATION: Medical therapy. Recommend echo Doppler study for LV function assessment since despite prolonged attempt at crossing the aortic valve with a catheter due to the significant tortuosity and left ventriculography was not able to be performed during this procedure.   Assessment and Plan  1. CAD - no recent symptoms, continue current meds  2. Chronic diastolic HF - doing well, no symptoms or edema - he reports nephrology stopped his torsemide, off diuretic has not had recurrent issues. Continue to monitor  3. Pericardial effusion - chronic, small to moderate. COntinue to monitor  F/u 6 months      Arnoldo Lenis, M.D.

## 2020-04-15 ENCOUNTER — Ambulatory Visit: Payer: Medicare Other | Admitting: Gastroenterology

## 2020-04-16 ENCOUNTER — Other Ambulatory Visit: Payer: Self-pay

## 2020-04-16 ENCOUNTER — Telehealth: Payer: Self-pay | Admitting: Gastroenterology

## 2020-04-16 ENCOUNTER — Ambulatory Visit (INDEPENDENT_AMBULATORY_CARE_PROVIDER_SITE_OTHER): Payer: Medicare Other | Admitting: Gastroenterology

## 2020-04-16 ENCOUNTER — Encounter: Payer: Self-pay | Admitting: Gastroenterology

## 2020-04-16 VITALS — BP 125/73 | HR 68 | Temp 97.2°F | Ht 71.0 in | Wt 206.2 lb

## 2020-04-16 DIAGNOSIS — K5900 Constipation, unspecified: Secondary | ICD-10-CM | POA: Diagnosis not present

## 2020-04-16 DIAGNOSIS — I2510 Atherosclerotic heart disease of native coronary artery without angina pectoris: Secondary | ICD-10-CM | POA: Diagnosis not present

## 2020-04-16 DIAGNOSIS — A048 Other specified bacterial intestinal infections: Secondary | ICD-10-CM

## 2020-04-16 DIAGNOSIS — R933 Abnormal findings on diagnostic imaging of other parts of digestive tract: Secondary | ICD-10-CM

## 2020-04-16 MED ORDER — POLYETHYLENE GLYCOL 3350 17 GM/SCOOP PO POWD: ORAL | 5 refills | Status: AC

## 2020-04-16 NOTE — Addendum Note (Signed)
Addended by: Mahala Menghini on: 04/16/2020 04:12 PM   Modules accepted: Orders

## 2020-04-16 NOTE — Telephone Encounter (Signed)
Lmom for pt to call back. 

## 2020-04-16 NOTE — Progress Notes (Signed)
Primary Care Physician: Kathyrn Drown, MD  Primary Gastroenterologist:  Garfield Cornea, MD   Chief Complaint  Patient presents with  . Constipation    h pylori    HPI: Jerry Burns is a 86 y.o. male here for follow-up.  Last seen in May 2021.  History of anemia, ascites.  Past medical history significant for multifocal pneumonia, diastolic CHF, CAD, CVA, type 2 diabetes, hypertension, chronic kidney disease stage III AAA, chronic anemia followed by hematology (last iron infusions back in March), previous DVT with presumed PE in February 2020 treated with Eliquis.  Last EGD and colonoscopy September 2019 showed rather severe ulcerative reflux esophagitis, H. pylori gastritis.  Colonic diverticulosis, single tubular adenoma.  No plans for future colonoscopy for screening/surveillance.  He had a CT angio chest on Nov 26, 2019.  He had multifocal pneumonia, tiny right pleural effusion, asbestos related pleural disease, new small pericardial effusion. 2-3 right middle lobe nodule requiring one year follow up. No PE. CT A/P with contrast same day showed generalized fat stranding ruq appears centered on the duodenum suspicious for duodenitis, nondistended gb with suggestion of mild wall thickening and pericholecystic edema may be reactive related to duodenal inflammation. Small amount of upper abdomen ascites. Bilateral inguinal hernias contain ascites. Mild bladder wall thickening can be seen with UTI. Liver measuring 20cm.   Abd u/s 11/30/19 with decompressed gb, nonspecific gb wall thickening. Small volume ascites.   At time of last office visit, it was confirmed that patient never received treatment for H. pylori in 2019.  He was treated with Prevpac.  Plans to return to the office in July the patient left before being seen.  Plan to consider EGD to evaluate abnormal duodenum on CT and reassess for H. Pylori.  Patient doing better than at last OV. No longer having edema issues.  Continues to struggle with constipation. Used to go like clockwork. Now has trouble getting stool started. Linzess 50mcg too strong, caused diarrhea and fecal incontinence. Stopped Linzess. MOM twice in two months. Taking Equate fiber powder. No longer on PPI or H2 blocker. Denies heartburn, dysphagia, vomiting. No melena, brbpr. Took prevpac equivalent back in June, "it about killed me but I finished it".  Patient's son-in-law is a Stage manager, nephew who is an Secondary school teacher, granddaughter is a PA.   Current Outpatient Medications  Medication Sig Dispense Refill  . amLODipine (NORVASC) 2.5 MG tablet TAKE (1) TABLET BY MOUTH ONCE DAILY. 90 tablet 0  . Ascorbic Acid (VITAMIN C) 1000 MG tablet Take 1,000 mg by mouth daily.     Marland Kitchen aspirin EC 81 MG tablet Take 81 mg by mouth daily.    Marland Kitchen atorvastatin (LIPITOR) 80 MG tablet TAKE (1) TABLET BY MOUTH ONCE DAILY. 90 tablet 1  . carvedilol (COREG) 12.5 MG tablet Take 1 tablet (12.5 mg total) by mouth 2 (two) times daily. 180 tablet 3  . COMBIGAN 0.2-0.5 % ophthalmic solution Place 1 drop into the left eye 2 (two) times daily.   2  . Cyanocobalamin (B-12) 2500 MCG TABS Take 1 tablet by mouth 2 (two) times a week. Three times a week on Monday Wednesday and Friday     . Emollient (CERAVE) CREA Apply 1 application topically daily at 6 (six) AM.     . empagliflozin (JARDIANCE) 10 MG TABS tablet Take 10 mg by mouth daily.    Marland Kitchen erythromycin ophthalmic ointment Place 1 application into the left eye 3 (three) times daily as needed (  infection).     . Flaxseed, Linseed, (FLAXSEED OIL PO) Take 1 capsule by mouth daily.     Marland Kitchen glucose blood (CONTOUR NEXT TEST) test strip USE 1 STRIP TO CHECK GLUCOSE THREE TIMES DAILY 300 each 0  . insulin glargine (LANTUS) 100 unit/mL SOPN Inject 20 Units into the skin daily.    . insulin lispro (HUMALOG KWIKPEN) 100 UNIT/ML KwikPen Inject twice daily per following: Breakfast 2 units; if 150-250 then 3 units; if greater than 250 5 units.  Supper-3 units but if 150-250 do 4 units; if greater that 250 6 units (Patient taking differently: Inject 2-4 Units into the skin See admin instructions. Inject 2 units at breakfast and 3 units at supper daily) 15 mL 5  . Lactobacillus (PROBIOTIC ACIDOPHILUS) CAPS Take 1 capsule by mouth daily at 6 (six) AM.    . magnesium hydroxide (MILK OF MAGNESIA) 400 MG/5ML suspension Take by mouth as needed for mild constipation.    . mometasone (ELOCON) 0.1 % cream APPLY TO AFFECTED AREAS ONCE DAILY. 45 g 3  . Multiple Vitamin (MULTIVITAMIN) tablet Take 1 tablet by mouth daily.    . nitroGLYCERIN (NITROSTAT) 0.4 MG SL tablet PLACE 1 TAB UNDER TONGUE EVERY 5 MIN IF NEEDED FOR CHEST PAIN. MAY USE 3 TIMES.NO RELIEF CALL 911. 25 tablet 2  . Polyvinyl Alcohol-Povidone (REFRESH OP) Place 1 drop into both ears 2 (two) times daily as needed (dryness).     . sitaGLIPtin (JANUVIA) 100 MG tablet TAKE (1) TABLET BY MOUTH ONCE DAILY. 30 tablet 5  . tamsulosin (FLOMAX) 0.4 MG CAPS capsule TAKE (1) CAPSULE BY MOUTH ONCE DAILY. (Patient taking differently: Take 0.4 mg by mouth daily. ) 90 capsule 0  . Turmeric 500 MG CAPS Take 500 mg by mouth daily.     No current facility-administered medications for this visit.    Allergies as of 04/16/2020 - Review Complete 04/16/2020  Allergen Reaction Noted  . Adhesive [tape]  05/11/2018  . Sulfa antibiotics Other (See Comments) 06/04/2015  . Latex Other (See Comments) 06/29/2012    ROS:  General: Negative for anorexia, weight loss, fever, chills, fatigue, +weakness "to be expected due to age". ENT: Negative for hoarseness, difficulty swallowing , nasal congestion. CV: Negative for chest pain, angina, palpitations, dyspnea on exertion, trace peripheral edema.  Respiratory: Negative for dyspnea at rest, dyspnea on exertion, cough, sputum, wheezing.  GI: See history of present illness. GU:  Negative for dysuria, hematuria, urinary incontinence, urinary frequency, nocturnal  urination.  Endo: Negative for unusual weight change.    Physical Examination:   BP 125/73   Pulse 68   Temp (!) 97.2 F (36.2 C) (Oral)   Ht 5\' 11"  (1.803 m)   Wt 206 lb 3.2 oz (93.5 kg)   BMI 28.76 kg/m   General: Well-nourished, well-developed elderly male in no acute distress.  Eyes: No icterus. Mouth: masked Lungs: Clear to auscultation bilaterally.  Heart: Regular rate and rhythm, no murmurs rubs or gallops.  Abdomen: Bowel sounds are normal, nontender, nondistended, no hepatosplenomegaly or masses, no abdominal bruits or hernia , no rebound or guarding.   Extremities: trace lower extremity edema. No clubbing or deformities. Neuro: Alert and oriented x 4   Skin: Warm and dry, no jaundice.   Psych: Alert and cooperative, normal mood and affect.  Labs:  Lab Results  Component Value Date   CREATININE 1.52 (H) 02/28/2020   BUN 40 (H) 02/28/2020   NA 139 02/28/2020   K 5.3 (H) 02/28/2020  CL 105 02/28/2020   CO2 22 02/28/2020   Lab Results  Component Value Date   ALT 29 02/28/2020   AST 27 02/28/2020   ALKPHOS 132 (H) 02/28/2020   BILITOT 0.9 02/28/2020   Lab Results  Component Value Date   WBC 6.8 02/28/2020   HGB 11.2 (L) 02/28/2020   HCT 35.8 (L) 02/28/2020   MCV 93.0 02/28/2020   PLT 180 02/28/2020   Lab Results  Component Value Date   IRON 43 (L) 02/28/2020   TIBC 317 02/28/2020   FERRITIN 345 (H) 02/28/2020   Lab Results  Component Value Date   VITAMINB12 1,950 (H) 02/28/2020   Lab Results  Component Value Date   FOLATE 13.8 02/28/2020     Imaging Studies: No results found.  Impression/plan:  86 year old gentleman with multiple comorbidities as outlined above presenting for follow-up of constipation, history of H. pylori, abnormal duodenal seen on prior CT, anemia.  Chronic anemia in the setting of chronic renal disease.  Last iron infusions back in March. Hemoglobin in the 11 range, improved from 4 months ago.  No overt GI bleeding.   Continue to follow with hematology.  Abnormal duodenum seen on CT in May 2021.  Wall thickening about the second, third, fourth portion of duodenum with adjacent fat stranding. Diagnosed with H. pylori on biopsy in 2019 but had not been treated.  Prescription sent in June 2021 after his last office visit.  Patient completed H. pylori therapy.  There have been plans to consider EGD after last office visit in July but patient left without being seen.  Clinically doing much better.  Will discuss further with Dr. Gala Romney before pursuing EGD for upper GI series.  At minimum we will check H. pylori stool antigen to confirm eradication.  Constipation: Developed diarrhea with Linzess 72 mcg daily.  Symptoms inadequately controlled with daily fiber supplement.  We will add MiraLAX half to 1 capful daily to maintain soft regular bowel movements.

## 2020-04-16 NOTE — Progress Notes (Signed)
CC'ED TO PCP 

## 2020-04-16 NOTE — Patient Instructions (Signed)
1. Start Miralax daily for constipation. I would start 1/2 capful mixed in 4 ounces of liquid daily. You can increase to whole capful daily if needed to achieve soft easy stools.  2. We will be in touch regarding possible upper endoscopy or barium xray to evaluate the abnormal duodenum seen on CT scan in 11/2019.

## 2020-04-16 NOTE — Telephone Encounter (Signed)
Pt called back and was informed that Dr. Gala Romney advises holding off on EGD as long as not abd pain, n/v, appetite issues.  Pt denies having any of these.  He was informed that he needs H pylori stool antigen to make sure we got rid of H pylori infection.  Pt advised not to take any antibiotics or acid reflux medications for 2 weeks before stool collection.    Jerry Crouch, PA:  Pt says he hasn't been on any antibiotics or acid reflux medications and wants to know if he can go to [place] on 04/18/2020.

## 2020-04-16 NOTE — Telephone Encounter (Signed)
Please let pt know that Dr. Gala Romney advises holding off on EGD as long as patient is not having abdominal pain, n/v, appetite issues. Today at office he was doing well.  We do need to have him complete H.pylori stool antigen to make sure we got rid of H.pylori infection. (no antibiotics or acid reflux medications for two weeks before stool collection).

## 2020-04-16 NOTE — Telephone Encounter (Signed)
Yes. I have placed order.

## 2020-04-16 NOTE — Telephone Encounter (Signed)
Noted.  Pt aware that he can go on 04/18/2020.

## 2020-04-18 DIAGNOSIS — A048 Other specified bacterial intestinal infections: Secondary | ICD-10-CM | POA: Diagnosis not present

## 2020-04-19 ENCOUNTER — Other Ambulatory Visit: Payer: Self-pay | Admitting: *Deleted

## 2020-04-22 LAB — H. PYLORI ANTIGEN, STOOL: H pylori Ag, Stl: NEGATIVE

## 2020-04-23 ENCOUNTER — Other Ambulatory Visit: Payer: Self-pay | Admitting: Family Medicine

## 2020-05-09 ENCOUNTER — Ambulatory Visit: Payer: Medicare Other | Attending: Internal Medicine

## 2020-05-09 DIAGNOSIS — Z23 Encounter for immunization: Secondary | ICD-10-CM

## 2020-05-09 NOTE — Progress Notes (Signed)
   Covid-19 Vaccination Clinic  Name:  Jerry Burns    MRN: [id number] DOB: [birth year]  05/09/2020  [name] was observed post Covid-19 immunization for 15 minutes without incident. He was provided with Vaccine Information Sheet and instruction to access the V-Safe system.   [name] was instructed to call 911 with any severe reactions post vaccine: Marland Kitchen Difficulty breathing  . Swelling of face and throat  . A fast heartbeat  . A bad rash all over body  . Dizziness and weakness

## 2020-05-13 ENCOUNTER — Telehealth: Payer: Self-pay

## 2020-05-13 ENCOUNTER — Other Ambulatory Visit: Payer: Self-pay | Admitting: *Deleted

## 2020-05-13 ENCOUNTER — Other Ambulatory Visit: Payer: Self-pay | Admitting: Family Medicine

## 2020-05-13 DIAGNOSIS — E1140 Type 2 diabetes mellitus with diabetic neuropathy, unspecified: Secondary | ICD-10-CM

## 2020-05-13 DIAGNOSIS — Z794 Long term (current) use of insulin: Secondary | ICD-10-CM

## 2020-05-13 DIAGNOSIS — I10 Essential (primary) hypertension: Secondary | ICD-10-CM

## 2020-05-13 NOTE — Telephone Encounter (Signed)
O1L, metabolic 7 wait until 20 October to do these

## 2020-05-13 NOTE — Telephone Encounter (Signed)
Labs ordered, pt notified.

## 2020-05-13 NOTE — Telephone Encounter (Signed)
Lipid, a1c and met 7 in July - please advise.

## 2020-05-13 NOTE — Telephone Encounter (Signed)
Patient wants to go have his labwork done at [facility] this week before his appt next week.  Please let him know when the order is in.

## 2020-05-15 DIAGNOSIS — I10 Essential (primary) hypertension: Secondary | ICD-10-CM | POA: Diagnosis not present

## 2020-05-15 DIAGNOSIS — E1140 Type 2 diabetes mellitus with diabetic neuropathy, unspecified: Secondary | ICD-10-CM | POA: Diagnosis not present

## 2020-05-15 DIAGNOSIS — Z794 Long term (current) use of insulin: Secondary | ICD-10-CM | POA: Diagnosis not present

## 2020-05-16 LAB — BASIC METABOLIC PANEL
BUN/Creatinine Ratio: 27 — ABNORMAL HIGH (ref 10–24)
BUN: 39 mg/dL — ABNORMAL HIGH (ref 8–27)
CO2: 24 mmol/L (ref 20–29)
Calcium: 9.9 mg/dL (ref 8.6–10.2)
Chloride: 105 mmol/L (ref 96–106)
Creatinine, Ser: 1.42 mg/dL — ABNORMAL HIGH (ref 0.76–1.27)
GFR calc Af Amer: 52 mL/min/{1.73_m2} — ABNORMAL LOW (ref >59)
GFR calc non Af Amer: 45 mL/min/{1.73_m2} — ABNORMAL LOW (ref >59)
Glucose: 139 mg/dL — ABNORMAL HIGH (ref 65–99)
Potassium: 5.6 mmol/L — ABNORMAL HIGH (ref 3.5–5.2)
Sodium: 143 mmol/L (ref 134–144)

## 2020-05-16 LAB — HEMOGLOBIN A1C
Est. average glucose Bld gHb Est-mCnc: 180 mg/dL
Hgb A1c MFr Bld: 7.9 % — ABNORMAL HIGH (ref 4.8–5.6)

## 2020-05-23 ENCOUNTER — Other Ambulatory Visit: Payer: Self-pay

## 2020-05-23 ENCOUNTER — Ambulatory Visit (INDEPENDENT_AMBULATORY_CARE_PROVIDER_SITE_OTHER): Payer: Medicare Other | Admitting: Family Medicine

## 2020-05-23 DIAGNOSIS — B349 Viral infection, unspecified: Secondary | ICD-10-CM | POA: Diagnosis not present

## 2020-05-23 DIAGNOSIS — I2510 Atherosclerotic heart disease of native coronary artery without angina pectoris: Secondary | ICD-10-CM

## 2020-05-23 DIAGNOSIS — Z20822 Contact with and (suspected) exposure to covid-19: Secondary | ICD-10-CM | POA: Diagnosis not present

## 2020-05-23 NOTE — Progress Notes (Signed)
   Subjective:    Patient ID: Jerry Burns, male    DOB: 10/09/1933, 86 y.o.   MRN: [id number]  HPI Patient with few days of his throat feeling raspy a little bit of throat congestion denies high fever chills sweats denies wheezing difficulty breathing no nausea vomiting diarrhea   Review of Systems  Constitutional: Negative for activity change, chills and fever.  HENT: Positive for congestion and rhinorrhea. Negative for ear pain.   Eyes: Negative for discharge.  Respiratory: Positive for cough. Negative for wheezing.   Cardiovascular: Negative for chest pain.  Gastrointestinal: Negative for nausea and vomiting.  Musculoskeletal: Negative for arthralgias.       Objective:   Physical Exam Vitals and nursing note reviewed.  Constitutional:      Appearance: He is well-developed.  HENT:     Head: Normocephalic.     Mouth/Throat:     Pharynx: No oropharyngeal exudate.  Cardiovascular:     Rate and Rhythm: Normal rate and regular rhythm.     Heart sounds: Normal heart sounds. No murmur heard.   Pulmonary:     Effort: Pulmonary effort is normal.     Breath sounds: Normal breath sounds. No wheezing.  Musculoskeletal:     Cervical back: Normal range of motion.  Lymphadenopathy:     Cervical: No cervical adenopathy.  Skin:    General: Skin is warm and dry.  Neurological:     Motor: No abnormal muscle tone.           Assessment & Plan:  Viral syndrome No antibiotics indicated No sign of pneumonia Recommend patient to lay low the next few days Covid test taken await results Follow-up if progressive troubles  Patient will do a follow-up office visit within the next 2 weeks for his other chronic health issues

## 2020-05-23 NOTE — Patient Instructions (Signed)
YourNorbert  Currently I believe that you have a viral illness that is causing your phlegm as well as raspy voice.  I do not find any evidence of pneumonia on today's exam.  Should you start running high fever and having shortness of breath please notify us right away or if in a emergency situation go to the ER.  As for the diabetes I will talk with my front staff they will reach out to you in the next few days to schedule a follow-up visit within the next 2 weeks when we will be able to discuss this further and make any adjustments necessary. When you come to that visit please bring some your readings as well as your medications.  Should you get progressively worse please reach out to [place] by call thank you-Dr. Nicki Reaper   Your Covid test will appear on my chart but when I see the results of that we will reach out to you as well thank you

## 2020-05-24 ENCOUNTER — Ambulatory Visit: Payer: Medicare Other | Admitting: Family Medicine

## 2020-05-24 LAB — SPECIMEN STATUS REPORT

## 2020-05-24 LAB — SARS-COV-2, NAA 2 DAY TAT

## 2020-05-24 LAB — NOVEL CORONAVIRUS, NAA: SARS-CoV-2, NAA: NOT DETECTED

## 2020-05-31 DIAGNOSIS — N1832 Chronic kidney disease, stage 3b: Secondary | ICD-10-CM | POA: Diagnosis not present

## 2020-06-04 DIAGNOSIS — I129 Hypertensive chronic kidney disease with stage 1 through stage 4 chronic kidney disease, or unspecified chronic kidney disease: Secondary | ICD-10-CM | POA: Diagnosis not present

## 2020-06-04 DIAGNOSIS — E785 Hyperlipidemia, unspecified: Secondary | ICD-10-CM | POA: Diagnosis not present

## 2020-06-04 DIAGNOSIS — I2510 Atherosclerotic heart disease of native coronary artery without angina pectoris: Secondary | ICD-10-CM | POA: Diagnosis not present

## 2020-06-04 DIAGNOSIS — E1122 Type 2 diabetes mellitus with diabetic chronic kidney disease: Secondary | ICD-10-CM | POA: Diagnosis not present

## 2020-06-04 DIAGNOSIS — N400 Enlarged prostate without lower urinary tract symptoms: Secondary | ICD-10-CM | POA: Diagnosis not present

## 2020-06-04 DIAGNOSIS — I5030 Unspecified diastolic (congestive) heart failure: Secondary | ICD-10-CM | POA: Diagnosis not present

## 2020-06-04 DIAGNOSIS — N1832 Chronic kidney disease, stage 3b: Secondary | ICD-10-CM | POA: Diagnosis not present

## 2020-06-10 ENCOUNTER — Other Ambulatory Visit: Payer: Self-pay | Admitting: Family Medicine

## 2020-06-11 ENCOUNTER — Other Ambulatory Visit: Payer: Self-pay | Admitting: Family Medicine

## 2020-06-12 DIAGNOSIS — E119 Type 2 diabetes mellitus without complications: Secondary | ICD-10-CM | POA: Diagnosis not present

## 2020-06-12 LAB — HM DIABETES EYE EXAM

## 2020-06-14 ENCOUNTER — Ambulatory Visit (INDEPENDENT_AMBULATORY_CARE_PROVIDER_SITE_OTHER): Payer: Medicare Other | Admitting: Family Medicine

## 2020-06-14 ENCOUNTER — Other Ambulatory Visit: Payer: Self-pay

## 2020-06-14 ENCOUNTER — Encounter: Payer: Self-pay | Admitting: Family Medicine

## 2020-06-14 VITALS — BP 136/68 | HR 75 | Temp 97.0°F | Ht 71.0 in | Wt 209.0 lb

## 2020-06-14 DIAGNOSIS — Z794 Long term (current) use of insulin: Secondary | ICD-10-CM | POA: Diagnosis not present

## 2020-06-14 DIAGNOSIS — E785 Hyperlipidemia, unspecified: Secondary | ICD-10-CM | POA: Diagnosis not present

## 2020-06-14 DIAGNOSIS — N1831 Chronic kidney disease, stage 3a: Secondary | ICD-10-CM

## 2020-06-14 DIAGNOSIS — F439 Reaction to severe stress, unspecified: Secondary | ICD-10-CM

## 2020-06-14 DIAGNOSIS — I10 Essential (primary) hypertension: Secondary | ICD-10-CM | POA: Diagnosis not present

## 2020-06-14 DIAGNOSIS — E1122 Type 2 diabetes mellitus with diabetic chronic kidney disease: Secondary | ICD-10-CM

## 2020-06-14 DIAGNOSIS — E1169 Type 2 diabetes mellitus with other specified complication: Secondary | ICD-10-CM | POA: Diagnosis not present

## 2020-06-14 DIAGNOSIS — I2510 Atherosclerotic heart disease of native coronary artery without angina pectoris: Secondary | ICD-10-CM | POA: Diagnosis not present

## 2020-06-14 NOTE — Progress Notes (Signed)
   Subjective:    Patient ID: Jerry Burns, male    DOB: [birth year], 86 y.o.   MRN: [id number]  Diabetes He presents for his follow-up diabetic visit. He has type 2 diabetes mellitus. Pertinent negatives for hypoglycemia include no headaches. Pertinent negatives for diabetes include no chest pain, no fatigue and no weakness. Eye exam is current (this week).  pt states sugar is running better. A1C one month ago 7.9.   States he does feel depressed some. phq9 done.  Patient is not suicidal.  At times he is just stressed out about various things.  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 Anginal pain ([place])   . Arthritis    Bilateral knee  . CHF (congestive heart failure) ([place])   . Coronary artery disease   . Diabetes mellitus    insulin dependent   . DVT, lower extremity, distal, acute ([place]) 09/09/2018  . Hyperlipidemia   . Hypertension   . Progressive angina ([place]) 03/2015   a. s/p DES to proximal LAD and balloon angioplasty to distal LAD in 2016 b. patent stent by repeat cath in 01/2017 with mild residual CAD)  . Stroke [facility])        Review of Systems  Constitutional: Negative for activity change, fatigue and fever.  HENT: Negative for congestion and rhinorrhea.   Respiratory: Negative for cough and shortness of breath.   Cardiovascular: Negative for chest pain and leg swelling.  Gastrointestinal: Negative for abdominal pain, diarrhea and nausea.  Genitourinary: Negative for dysuria and hematuria.  Neurological: Negative for weakness and headaches.  Psychiatric/Behavioral: Negative for agitation and behavioral problems.       Objective:   Physical Exam Vitals reviewed.  Constitutional:      General: He is not in acute distress. HENT:     Head: Normocephalic and atraumatic.  Eyes:     General:        Right eye: No discharge.        Left eye: No discharge.  Neck:     Trachea: No tracheal  deviation.  Cardiovascular:     Rate and Rhythm: Normal rate and regular rhythm.     Heart sounds: Normal heart sounds. No murmur heard.   Pulmonary:     Effort: Pulmonary effort is normal. No respiratory distress.     Breath sounds: Normal breath sounds.  Lymphadenopathy:     Cervical: No cervical adenopathy.  Skin:    General: Skin is warm and dry.  Neurological:     Mental Status: He is alert.     Coordination: Coordination normal.  Psychiatric:        Behavior: Behavior normal.           Assessment & Plan:  1. Essential hypertension Blood pressure good control currently.  Watch diet closely stay active - Basic metabolic panel  2. Hyperlipidemia associated with type 2 diabetes mellitus ([place]) : Cholesterol overall okay needs a recheck lipid profile before next visit continue medication - Lipid panel  3. Type 2 diabetes mellitus with stage 3a chronic kidney disease, with long-term current use of insulin (HCC) Importance of keeping his sugars under good control continue current measures - Hemoglobin A1c  4. 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

## 2020-06-24 ENCOUNTER — Other Ambulatory Visit (HOSPITAL_COMMUNITY): Payer: Self-pay | Admitting: Surgery

## 2020-06-24 DIAGNOSIS — D649 Anemia, unspecified: Secondary | ICD-10-CM

## 2020-06-25 ENCOUNTER — Other Ambulatory Visit: Payer: Self-pay

## 2020-06-25 ENCOUNTER — Inpatient Hospital Stay (HOSPITAL_COMMUNITY): Payer: Medicare Other | Attending: Hematology

## 2020-06-25 DIAGNOSIS — Z79899 Other long term (current) drug therapy: Secondary | ICD-10-CM | POA: Diagnosis not present

## 2020-06-25 DIAGNOSIS — D649 Anemia, unspecified: Secondary | ICD-10-CM | POA: Diagnosis not present

## 2020-06-25 LAB — CBC WITH DIFFERENTIAL/PLATELET
Abs Immature Granulocytes: 0.02 10*3/uL (ref 0.00–0.07)
Basophils Absolute: 0.1 10*3/uL (ref 0.0–0.1)
Basophils Relative: 1 %
Eosinophils Absolute: 0.4 10*3/uL (ref 0.0–0.5)
Eosinophils Relative: 6 %
HCT: 39.1 % (ref 39.0–52.0)
Hemoglobin: 12.3 g/dL — ABNORMAL LOW (ref 13.0–17.0)
Immature Granulocytes: 0 %
Lymphocytes Relative: 14 %
Lymphs Abs: 0.9 10*3/uL (ref 0.7–4.0)
MCH: 30.5 pg (ref 26.0–34.0)
MCHC: 31.5 g/dL (ref 30.0–36.0)
MCV: 97 fL (ref 80.0–100.0)
Monocytes Absolute: 0.8 10*3/uL (ref 0.1–1.0)
Monocytes Relative: 12 %
Neutro Abs: 4.1 10*3/uL (ref 1.7–7.7)
Neutrophils Relative %: 67 %
Platelets: 171 10*3/uL (ref 150–400)
RBC: 4.03 MIL/uL — ABNORMAL LOW (ref 4.22–5.81)
RDW: 15.5 % (ref 11.5–15.5)
WBC: 6.2 10*3/uL (ref 4.0–10.5)
nRBC: 0 % (ref 0.0–0.2)

## 2020-06-25 LAB — COMPREHENSIVE METABOLIC PANEL
ALT: 34 U/L (ref 0–44)
AST: 27 U/L (ref 15–41)
Albumin: 4.3 g/dL (ref 3.5–5.0)
Alkaline Phosphatase: 130 U/L — ABNORMAL HIGH (ref 38–126)
Anion gap: 8 (ref 5–15)
BUN: 44 mg/dL — ABNORMAL HIGH (ref 8–23)
CO2: 24 mmol/L (ref 22–32)
Calcium: 9.7 mg/dL (ref 8.9–10.3)
Chloride: 108 mmol/L (ref 98–111)
Creatinine, Ser: 1.42 mg/dL — ABNORMAL HIGH (ref 0.61–1.24)
GFR, Estimated: 49 mL/min — ABNORMAL LOW (ref >60)
Glucose, Bld: 141 mg/dL — ABNORMAL HIGH (ref 70–99)
Potassium: 5.5 mmol/L — ABNORMAL HIGH (ref 3.5–5.1)
Sodium: 140 mmol/L (ref 135–145)
Total Bilirubin: 0.9 mg/dL (ref 0.3–1.2)
Total Protein: 7.7 g/dL (ref 6.5–8.1)

## 2020-06-25 LAB — VITAMIN B12: Vitamin B-12: 1238 pg/mL — ABNORMAL HIGH (ref 180–914)

## 2020-06-25 LAB — LACTATE DEHYDROGENASE: LDH: 145 U/L (ref 98–192)

## 2020-06-25 LAB — IRON AND TIBC
Iron: 71 ug/dL (ref 45–182)
Saturation Ratios: 21 % (ref 17.9–39.5)
TIBC: 332 ug/dL (ref 250–450)
UIBC: 261 ug/dL

## 2020-06-25 LAB — VITAMIN D 25 HYDROXY (VIT D DEFICIENCY, FRACTURES): Vit D, 25-Hydroxy: 40.73 ng/mL (ref 30–100)

## 2020-06-25 LAB — FERRITIN: Ferritin: 330 ng/mL (ref 24–336)

## 2020-06-25 LAB — FOLATE: Folate: 15.6 ng/mL

## 2020-06-26 ENCOUNTER — Other Ambulatory Visit (HOSPITAL_COMMUNITY): Payer: Medicare Other

## 2020-06-30 ENCOUNTER — Other Ambulatory Visit: Payer: Self-pay | Admitting: Family Medicine

## 2020-07-03 ENCOUNTER — Ambulatory Visit (HOSPITAL_COMMUNITY): Payer: Medicare Other | Admitting: Hematology

## 2020-07-04 DIAGNOSIS — H02115 Cicatricial ectropion of left lower eyelid: Secondary | ICD-10-CM | POA: Diagnosis not present

## 2020-07-05 ENCOUNTER — Telehealth: Payer: Self-pay

## 2020-07-05 ENCOUNTER — Other Ambulatory Visit: Payer: Self-pay

## 2020-07-05 ENCOUNTER — Inpatient Hospital Stay (HOSPITAL_COMMUNITY): Payer: Medicare Other | Attending: Hematology | Admitting: Hematology and Oncology

## 2020-07-05 ENCOUNTER — Encounter (HOSPITAL_COMMUNITY): Payer: Self-pay | Admitting: Hematology and Oncology

## 2020-07-05 VITALS — BP 118/62 | HR 72 | Temp 97.5°F | Resp 17 | Ht 71.0 in | Wt 211.1 lb

## 2020-07-05 DIAGNOSIS — Z800 Family history of malignant neoplasm of digestive organs: Secondary | ICD-10-CM | POA: Insufficient documentation

## 2020-07-05 DIAGNOSIS — Z79899 Other long term (current) drug therapy: Secondary | ICD-10-CM | POA: Insufficient documentation

## 2020-07-05 DIAGNOSIS — Z808 Family history of malignant neoplasm of other organs or systems: Secondary | ICD-10-CM | POA: Insufficient documentation

## 2020-07-05 DIAGNOSIS — Z87891 Personal history of nicotine dependence: Secondary | ICD-10-CM | POA: Insufficient documentation

## 2020-07-05 DIAGNOSIS — D649 Anemia, unspecified: Secondary | ICD-10-CM | POA: Insufficient documentation

## 2020-07-05 DIAGNOSIS — Z86718 Personal history of other venous thrombosis and embolism: Secondary | ICD-10-CM | POA: Insufficient documentation

## 2020-07-05 MED ORDER — CONTOUR NEXT TEST VI STRP
ORAL_STRIP | 0 refills | Status: DC
Start: 2020-07-05 — End: 2020-07-11

## 2020-07-05 NOTE — Telephone Encounter (Signed)
Needing Blood sugar test strips sent to [facility] in [place].  He is getting low but has enough to last through the weekend.

## 2020-07-05 NOTE — Progress Notes (Signed)
[place], [place] [address]   CLINIC:  Medical Oncology/Hematology  PCP:  Kathyrn Drown, MD [address] [place] [address] [phone]  REASON FOR VISIT: Follow-up for iron deficiency anemia  CURRENT THERAPY: Intermittent iron infusions  INTERVAL HISTORY:  Jerry Burns 86 y.o. male returns for routine follow-up for iron deficiency anemia.  He is feeling well, energy is good, appetite is good. He denies any blood in his stool or melena. No hematuria. He denies any recent falls. SOB at baseline  REVIEW OF SYSTEMS:  Review of Systems  Constitutional: Positive for fatigue (improving).  Respiratory: Positive for shortness of breath.   Cardiovascular: Positive for leg swelling.  Psychiatric/Behavioral: Positive for sleep disturbance.  All other systems reviewed and are negative.    PAST MEDICAL/SURGICAL HISTORY:  Past Medical History:  Diagnosis Date  . Anginal pain ([place])   . Arthritis    Bilateral knee  . CHF (congestive heart failure) ([place])   . Coronary artery disease   . Diabetes mellitus    insulin dependent   . DVT, lower extremity, distal, acute ([place]) 09/09/2018  . Hyperlipidemia   . Hypertension   . Progressive angina ([place]) 03/2015   a. s/p DES to proximal LAD and balloon angioplasty to distal LAD in 2016 b. patent stent by repeat cath in 01/2017 with mild residual CAD)  . Stroke [facility])    Past Surgical History:  Procedure Laterality Date  . APPENDECTOMY    . CARDIAC CATHETERIZATION N/A 04/12/2015   Procedure: Left Heart Cath and Coronary Angiography;  Surgeon: Sherren Mocha, MD; pLAD 75>0% w/ 3.0x12 mm Resolute DES, dLAD 80>0% w/ Angiosculpt scoring balloon, CFX luminal irreg, RCA mild dz, EF 55-65%   . COLONOSCOPY  2011   RMR: 1. Normal rectum 2. Left sided diverticula , 2 ascending colon polyps, status post snare polypectomy. Remainder of colonic mucosa appeared unremarkable.   . COLONOSCOPY N/A  05/13/2016   Procedure: COLONOSCOPY;  Surgeon: Daneil Dolin, MD;  Location[facility];  Service: Endoscopy;  Laterality: N/A;  7:30 AM  . COLONOSCOPY N/A 05/18/2018   [place]k: Diverticulosis, single tubular adenoma removed.  No plans for future screening or surveillance colonoscopy due to age.  . CORONARY ANGIOGRAPHY N/A 02/04/2017   Procedure: CORONARY ANGIOGRAPHY (CATH LAB);  Surgeon: Troy Sine, MD;  Location: [facility] CV LAB;  Service: Cardiovascular;  Laterality: N/A;  . CORONARY ANGIOPLASTY WITH STENT PLACEMENT  08/22/2014   PTCA/DES X mLAD with 2.5 x 16 mm Promus Premier DES by Dr Julianne Handler  . ESOPHAGOGASTRODUODENOSCOPY N/A 05/18/2018   [place]k: Ulcerative reflux esophagitis, hiatal hernia, erosive gastropathy with H. pylori on biopsy, also in the setting of NSAID use.  Marland Kitchen FOOT SURGERY Right   . LEFT HEART CATHETERIZATION WITH CORONARY ANGIOGRAM N/A 08/22/2014   Procedure: LEFT HEART CATHETERIZATION WITH CORONARY ANGIOGRAM;  Surgeon: Burnell Blanks, MD;  Location: [facility] CATH LAB;  Service: Cardiovascular;  Laterality: N/A;  . PERCUTANEOUS CORONARY STENT INTERVENTION (PCI-S)  08/22/2014   Procedure: PERCUTANEOUS CORONARY STENT INTERVENTION (PCI-S);  Surgeon: Burnell Blanks, MD;   . SHOULDER SURGERY    . TONSILLECTOMY       SOCIAL HISTORY:  Social History   Socioeconomic History  . Marital status: Married    Spouse name: Not on file  . Number of children: Not on file  . Years of education: 30  . Highest education level: Not on file  Occupational History  .  Occupation: retired  Tobacco Use  . Smoking status: Former Smoker    Packs/day: 1.00    Years: 20.00    Pack years: 20.00    Types: Cigarettes, Cigars    Start date: 08/10/1955    Quit date: 07/28/2007    Years since quitting: 12.9  . Smokeless tobacco: Never Used  Vaping Use  . Vaping Use: Never used  Substance and Sexual Activity  . Alcohol use: Not Currently    Alcohol/week: 0.0 standard drinks     Comment: Daily about 1 glass of wine and gin  . Drug use: No  . Sexual activity: Not Currently  Other Topics Concern  . Not on file  Social History Narrative  . Not on file   Social Determinants of Health   Financial Resource Strain: Low Risk   . Difficulty of Paying Living Expenses: Not very hard  Food Insecurity: No Food Insecurity  . Worried About Charity fundraiser in the Last Year: Never true  . Ran Out of Food in the Last Year: Never true  Transportation Needs: No Transportation Needs  . Lack of Transportation (Medical): No  . Lack of Transportation (Non-Medical): No  Physical Activity: Inactive  . Days of Exercise per Week: 0 days  . Minutes of Exercise per Session: 0 min  Stress: No Stress Concern Present  . Feeling of Stress : Not at all  Social Connections: Moderately Integrated  . Frequency of Communication with Friends and Family: More than three times a week  . Frequency of Social Gatherings with Friends and Family: More than three times a week  . Attends Religious Services: 1 to 4 times per year  . Active Member of Clubs or Organizations: No  . Attends Archivist Meetings: Never  . Marital Status: Married  Human resources officer Violence: Not At Risk  . Fear of Current or Ex-Partner: No  . Emotionally Abused: No  . Physically Abused: No  . Sexually Abused: No    FAMILY HISTORY:  Family History  Problem Relation Age of Onset  . Diabetes Other   . Diabetes Brother   . Colon cancer Maternal Uncle   . Skin cancer Mother   . Diabetes Father   . Hypertension Sister   . Skin cancer Sister     CURRENT MEDICATIONS:  Outpatient Encounter Medications as of 07/05/2020  Medication Sig Note  . Ascorbic Acid (VITAMIN C) 1000 MG tablet Take 1,000 mg by mouth daily.    Marland Kitchen aspirin EC 81 MG tablet Take 81 mg by mouth daily.   Marland Kitchen atorvastatin (LIPITOR) 80 MG tablet TAKE (1) TABLET BY MOUTH ONCE DAILY.   Marland Kitchen B-D UF III MINI PEN NEEDLES 31G X 5 MM MISC SMARTSIG:1  Each SUB-Q Twice Daily   . carvedilol (COREG) 12.5 MG tablet Take 1 tablet (12.5 mg total) by mouth 2 (two) times daily.   . COMBIGAN 0.2-0.5 % ophthalmic solution Place 1 drop into the left eye 2 (two) times daily.    . CONTOUR NEXT TEST test strip USE 1 STRIP TO CHECK GLUCOSE THREE TIMES DAILY   . Cyanocobalamin (B-12) 2500 MCG TABS Take 1 tablet by mouth 2 (two) times a week. Three times a week on Monday Wednesday and Friday   . Emollient (CERAVE) CREA Apply 1 application topically daily at 6 (six) AM.   . empagliflozin (JARDIANCE) 10 MG TABS tablet Take 10 mg by mouth daily.   Marland Kitchen erythromycin ophthalmic ointment Place 1 application into the left eye  3 (three) times daily as needed (infection).    . Flaxseed, Linseed, (FLAXSEED OIL PO) Take 1 capsule by mouth daily.    . insulin lispro (HUMALOG KWIKPEN) 100 UNIT/ML KwikPen Inject twice daily per following: Breakfast 2 units; if 150-250 then 3 units; if greater than 250 5 units. Supper-3 units but if 150-250 do 4 units; if greater that 250 6 units (Patient taking differently: Inject 2-4 Units into the skin See admin instructions. Inject 2 units at breakfast and 3 units at supper daily) 11/26/2019: Patient took 2 units on 11/26/19 at 0500  . Lactobacillus (PROBIOTIC ACIDOPHILUS) CAPS Take 1 capsule by mouth daily at 6 (six) AM.   . LANTUS SOLOSTAR 100 UNIT/ML Solostar Pen INJECT 22 UNITS SUBCUTANEOUSLY ONCE DAILY AT 10PM   . mometasone (ELOCON) 0.1 % cream APPLY TO AFFECTED AREAS ONCE DAILY. 11/26/2019: Patient has if needed but has not had to take recently  . Multiple Vitamin (MULTIVITAMIN) tablet Take 1 tablet by mouth daily.   . nitroGLYCERIN (NITROSTAT) 0.4 MG SL tablet PLACE 1 TAB UNDER TONGUE EVERY 5 MIN IF NEEDED FOR CHEST PAIN. MAY USE 3 TIMES.NO RELIEF CALL 911.   . polyethylene glycol powder (MIRALAX) 17 GM/SCOOP powder Start by taking 1/2 capful mixed in 4 ounces of fluid daily. You can increase to 1 capful daily if needed to maintain easy BMs.    . Polyvinyl Alcohol-Povidone (REFRESH OP) Place 1 drop into both ears 2 (two) times daily as needed (dryness).    . sitaGLIPtin (JANUVIA) 100 MG tablet TAKE ONE TABLET BY MOUTH ONCE DAILY.   . tamsulosin (FLOMAX) 0.4 MG CAPS capsule TAKE (1) CAPSULE BY MOUTH ONCE DAILY.   . Turmeric 500 MG CAPS Take 500 mg by mouth daily.    No facility-administered encounter medications on file as of 07/05/2020.    ALLERGIES:  Allergies  Allergen Reactions  . Adhesive [Tape]     bandaids cause blisters after leaving on for 24 hours   . Sulfa Antibiotics Other (See Comments)    "was terrible" "eye went crazy"  . Latex Other (See Comments)    blisters     PHYSICAL EXAM:  ECOG Performance status: 1  Vitals:   07/05/20 1315  BP: 118/62  Pulse: 72  Resp: 17  Temp: (!) 97.5 F (36.4 C)  SpO2: 98%   Filed Weights   07/05/20 1315  Weight: 211 lb 2 oz (95.8 kg)    Physical Exam Constitutional:      Appearance: Normal appearance. He is normal weight.  Musculoskeletal:        General: Normal range of motion.  Skin:    General: Skin is warm.  Neurological:     Mental Status: He is alert and oriented to person, place, and time. Mental status is at baseline.  Psychiatric:        Mood and Affect: Mood normal.        Behavior: Behavior normal.        Thought Content: Thought content normal.        Judgment: Judgment normal.      LABORATORY DATA:  I have reviewed the labs as listed.  CBC    Component Value Date/Time   WBC 6.2 06/25/2020 1057   RBC 4.03 (L) 06/25/2020 1057   HGB 12.3 (L) 06/25/2020 1057   HGB 10.2 (L) 12/11/2019 1037   HCT 39.1 06/25/2020 1057   HCT 30.7 (L) 12/11/2019 1037   PLT 171 06/25/2020 1057   PLT 264 12/11/2019 1037  MCV 97.0 06/25/2020 1057   MCV 94 12/11/2019 1037   MCH 30.5 06/25/2020 1057   MCHC 31.5 06/25/2020 1057   RDW 15.5 06/25/2020 1057   RDW 12.4 12/11/2019 1037   LYMPHSABS 0.9 06/25/2020 1057   LYMPHSABS 0.8 12/11/2019 1037   MONOABS  0.8 06/25/2020 1057   EOSABS 0.4 06/25/2020 1057   EOSABS 0.3 12/11/2019 1037   BASOSABS 0.1 06/25/2020 1057   BASOSABS 0.1 12/11/2019 1037   CMP Latest Ref Rng & Units 06/25/2020 05/15/2020 02/28/2020  Glucose 70 - 99 mg/dL 141(H) 139(H) 126(H)  BUN 8 - 23 mg/dL 44(H) 39(H) 40(H)  Creatinine 0.61 - 1.24 mg/dL 1.42(H) 1.42(H) 1.52(H)  Sodium 135 - 145 mmol/L 140 143 139  Potassium 3.5 - 5.1 mmol/L 5.5(H) 5.6(H) 5.3(H)  Chloride 98 - 111 mmol/L 108 105 105  CO2 22 - 32 mmol/L 24 24 22   Calcium 8.9 - 10.3 mg/dL 9.7 9.9 9.7  Total Protein 6.5 - 8.1 g/dL 7.7 - 7.8  Total Bilirubin 0.3 - 1.2 mg/dL 0.9 - 0.9  Alkaline Phos 38 - 126 U/L 130(H) - 132(H)  AST 15 - 41 U/L 27 - 27  ALT 0 - 44 U/L 34 - 29     ASSESSMENT & PLAN:   No problem-specific Assessment & Plan notes found for this encounter.  1.Normocytic anemia: -Thought to be from GI blood loss. -He had a hemoglobin of 9.7 in September 2019 followed by a GI work-up. -He had an EGD on 05/18/2018 showed erosive reflux esophagitis, erosive gastropathy. Biopsy was consistent with chronic active gastritis with H. pylori. It was thought that he could have been bleeding from upper GI. -Colonoscopy on 05/18/2018 showed diverticulosis of the sigmoid colon, descending colon and transverse colon. Tubular adenoma from hepatic flexure was resected. - During his initial work up, stool for occult blood x3 was negative. Direct Coombs test and LDH were normal. K80 and folic acid were normal.  NO concerning ROS or PE findings. - Recent labs showed a Hb of 12.3, ferritin of 330. -He does not need any iron transfusions at this time. -He will follow-up in 6 months with repeat labs.  2.Distal DVT: -He had an ultrasound on 09/03/2018 showed possible nonocclusive thrombus of the intermediate acuity within 1 of the posterior tibial veins in the right calf. -He is currently on Eliquis 5 mg twice daily and tolerating well. That was started in  February 2019. -A CT scan of the chest PE protocol could not be done as he is unable to lie flat at that time. -He reports he is now able to lie flat and take deep breaths. -He is no longer taking Eliquis.  He reports it was stopped after 6 months.    Orders placed this encounter:  No orders of the defined types were placed in this encounter.  Benay Pike MD

## 2020-07-05 NOTE — Patient Instructions (Signed)
[facility] at [facility] Discharge Instructions  You were seen today by Dr. Iruku. Follow up as scheduled.   Thank you for choosing [facility] at [facility] to provide your oncology and hematology care.  To afford each patient quality time with our provider, please arrive at least 15 minutes before your scheduled appointment time.   If you have a lab appointment with the Cancer Center please come in thru the Main Entrance and check in at the main information desk.  You need to re-schedule your appointment should you arrive 10 or more minutes late.  We strive to give you quality time with our providers, and arriving late affects you and other patients whose appointments are after yours.  Also, if you no show three or more times for appointments you may be dismissed from the clinic at the providers discretion.     Again, thank you for choosing [facility].  Our hope is that these requests will decrease the amount of time that you wait before being seen by our physicians.       _____________________________________________________________  Should you have questions after your visit to [facility], please contact our office at [phone] and follow the prompts.  Our office hours are 8:00 a.m. and 4:30 p.m. Monday - Friday.  Please note that voicemails left after 4:00 p.m. may not be returned until the following business day.  We are closed weekends and major holidays.  You do have access to a nurse 24-7, just call the main number to the clinic [phone] and do not press any options, hold on the line and a nurse will answer the phone.    For prescription refill requests, have your pharmacy contact our office and allow 72 hours.    Due to Covid, you will need to wear a mask upon entering the hospital. If you do not have a mask, a mask will be given to you at the Main Entrance upon arrival. For doctor visits, patients may  have 1 support person age 18 or older with them. For treatment visits, patients can not have anyone with them due to social distancing guidelines and our immunocompromised population.     

## 2020-07-05 NOTE — Telephone Encounter (Signed)
Glucose strips sent to [facility].

## 2020-07-11 ENCOUNTER — Other Ambulatory Visit: Payer: Self-pay | Admitting: *Deleted

## 2020-07-11 ENCOUNTER — Other Ambulatory Visit: Payer: Self-pay | Admitting: Family Medicine

## 2020-07-11 ENCOUNTER — Telehealth: Payer: Self-pay

## 2020-07-11 ENCOUNTER — Other Ambulatory Visit: Payer: Self-pay

## 2020-07-11 DIAGNOSIS — N1831 Chronic kidney disease, stage 3a: Secondary | ICD-10-CM

## 2020-07-11 DIAGNOSIS — H16212 Exposure keratoconjunctivitis, left eye: Secondary | ICD-10-CM | POA: Insufficient documentation

## 2020-07-11 DIAGNOSIS — H02115 Cicatricial ectropion of left lower eyelid: Secondary | ICD-10-CM | POA: Insufficient documentation

## 2020-07-11 MED ORDER — CONTOUR NEXT TEST VI STRP: ORAL_STRIP | 0 refills | Status: DC

## 2020-07-11 NOTE — Telephone Encounter (Signed)
Called pharm and resent rx.

## 2020-07-11 NOTE — Telephone Encounter (Signed)
Pt is need nurse to call [facility] unable to pick up test strips need clarification from the doctor is what pt was told at the pharmacy.   Pt call back [phone]

## 2020-07-15 ENCOUNTER — Other Ambulatory Visit: Payer: Self-pay | Admitting: Family Medicine

## 2020-08-06 ENCOUNTER — Encounter: Payer: Self-pay | Admitting: Family Medicine

## 2020-08-07 ENCOUNTER — Encounter: Payer: Self-pay | Admitting: Family Medicine

## 2020-08-07 ENCOUNTER — Telehealth: Payer: Self-pay | Admitting: Family Medicine

## 2020-08-07 NOTE — Telephone Encounter (Signed)
Front I realize that everybody is busy Please see my chart message Please make sure that letter is faxed to the attorney as requested this week Thank you so much

## 2020-08-07 NOTE — Telephone Encounter (Signed)
Front I have dictated a letter Please fax it to their attorney You may send Adah Salvage a MyChart message letting him know that this is been completed thank you

## 2020-08-12 ENCOUNTER — Other Ambulatory Visit: Payer: Self-pay | Admitting: Family Medicine

## 2020-08-27 DIAGNOSIS — E1169 Type 2 diabetes mellitus with other specified complication: Secondary | ICD-10-CM | POA: Diagnosis not present

## 2020-08-27 DIAGNOSIS — E1122 Type 2 diabetes mellitus with diabetic chronic kidney disease: Secondary | ICD-10-CM | POA: Diagnosis not present

## 2020-08-27 DIAGNOSIS — N1831 Chronic kidney disease, stage 3a: Secondary | ICD-10-CM | POA: Diagnosis not present

## 2020-08-27 DIAGNOSIS — I10 Essential (primary) hypertension: Secondary | ICD-10-CM | POA: Diagnosis not present

## 2020-08-27 DIAGNOSIS — N1832 Chronic kidney disease, stage 3b: Secondary | ICD-10-CM | POA: Diagnosis not present

## 2020-08-27 DIAGNOSIS — E785 Hyperlipidemia, unspecified: Secondary | ICD-10-CM | POA: Diagnosis not present

## 2020-08-27 DIAGNOSIS — Z794 Long term (current) use of insulin: Secondary | ICD-10-CM | POA: Diagnosis not present

## 2020-08-28 LAB — BASIC METABOLIC PANEL
BUN/Creatinine Ratio: 30 — ABNORMAL HIGH (ref 10–24)
BUN: 51 mg/dL — ABNORMAL HIGH (ref 8–27)
CO2: 24 mmol/L (ref 20–29)
Calcium: 9.8 mg/dL (ref 8.6–10.2)
Chloride: 103 mmol/L (ref 96–106)
Creatinine, Ser: 1.69 mg/dL — ABNORMAL HIGH (ref 0.76–1.27)
GFR calc Af Amer: 42 mL/min/{1.73_m2} — ABNORMAL LOW (ref >59)
GFR calc non Af Amer: 36 mL/min/{1.73_m2} — ABNORMAL LOW (ref >59)
Glucose: 91 mg/dL (ref 65–99)
Potassium: 5.6 mmol/L — ABNORMAL HIGH (ref 3.5–5.2)
Sodium: 140 mmol/L (ref 134–144)

## 2020-08-28 LAB — LIPID PANEL
Chol/HDL Ratio: 3.2 ratio (ref 0.0–5.0)
Cholesterol, Total: 139 mg/dL (ref 100–199)
HDL: 44 mg/dL (ref >39)
LDL Chol Calc (NIH): 78 mg/dL (ref 0–99)
Triglycerides: 90 mg/dL (ref 0–149)
VLDL Cholesterol Cal: 17 mg/dL (ref 5–40)

## 2020-08-28 LAB — HEMOGLOBIN A1C
Est. average glucose Bld gHb Est-mCnc: 169 mg/dL
Hgb A1c MFr Bld: 7.5 % — ABNORMAL HIGH (ref 4.8–5.6)

## 2020-09-03 IMAGING — DX CHEST - 2 VIEW
2 series · 2 of 2 positions shown · non-contrast
Comparison: 09/13/2018

CLINICAL DATA: Pleural effusion

EXAM:
CHEST - 2 VIEW

[chest pa]
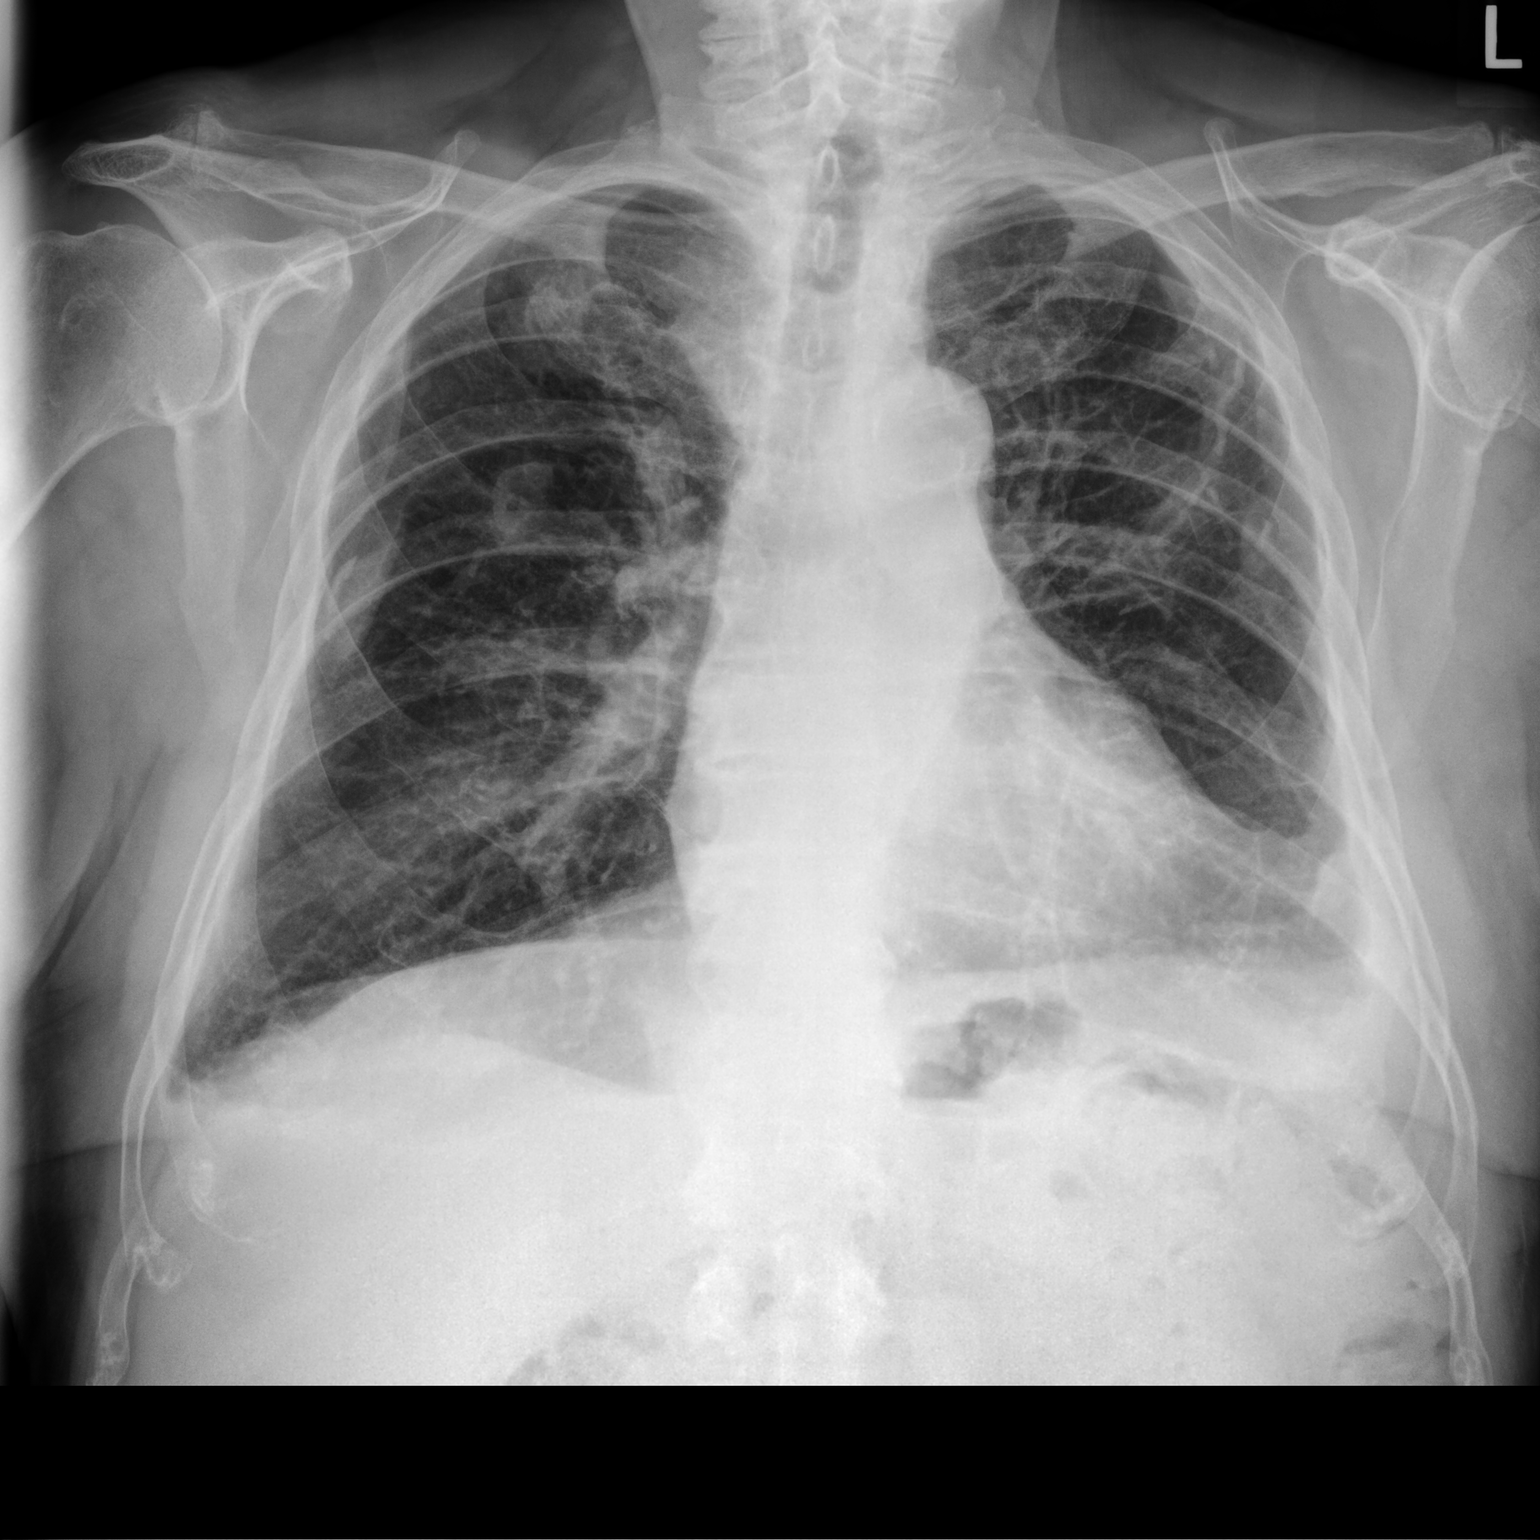

[chest lat]
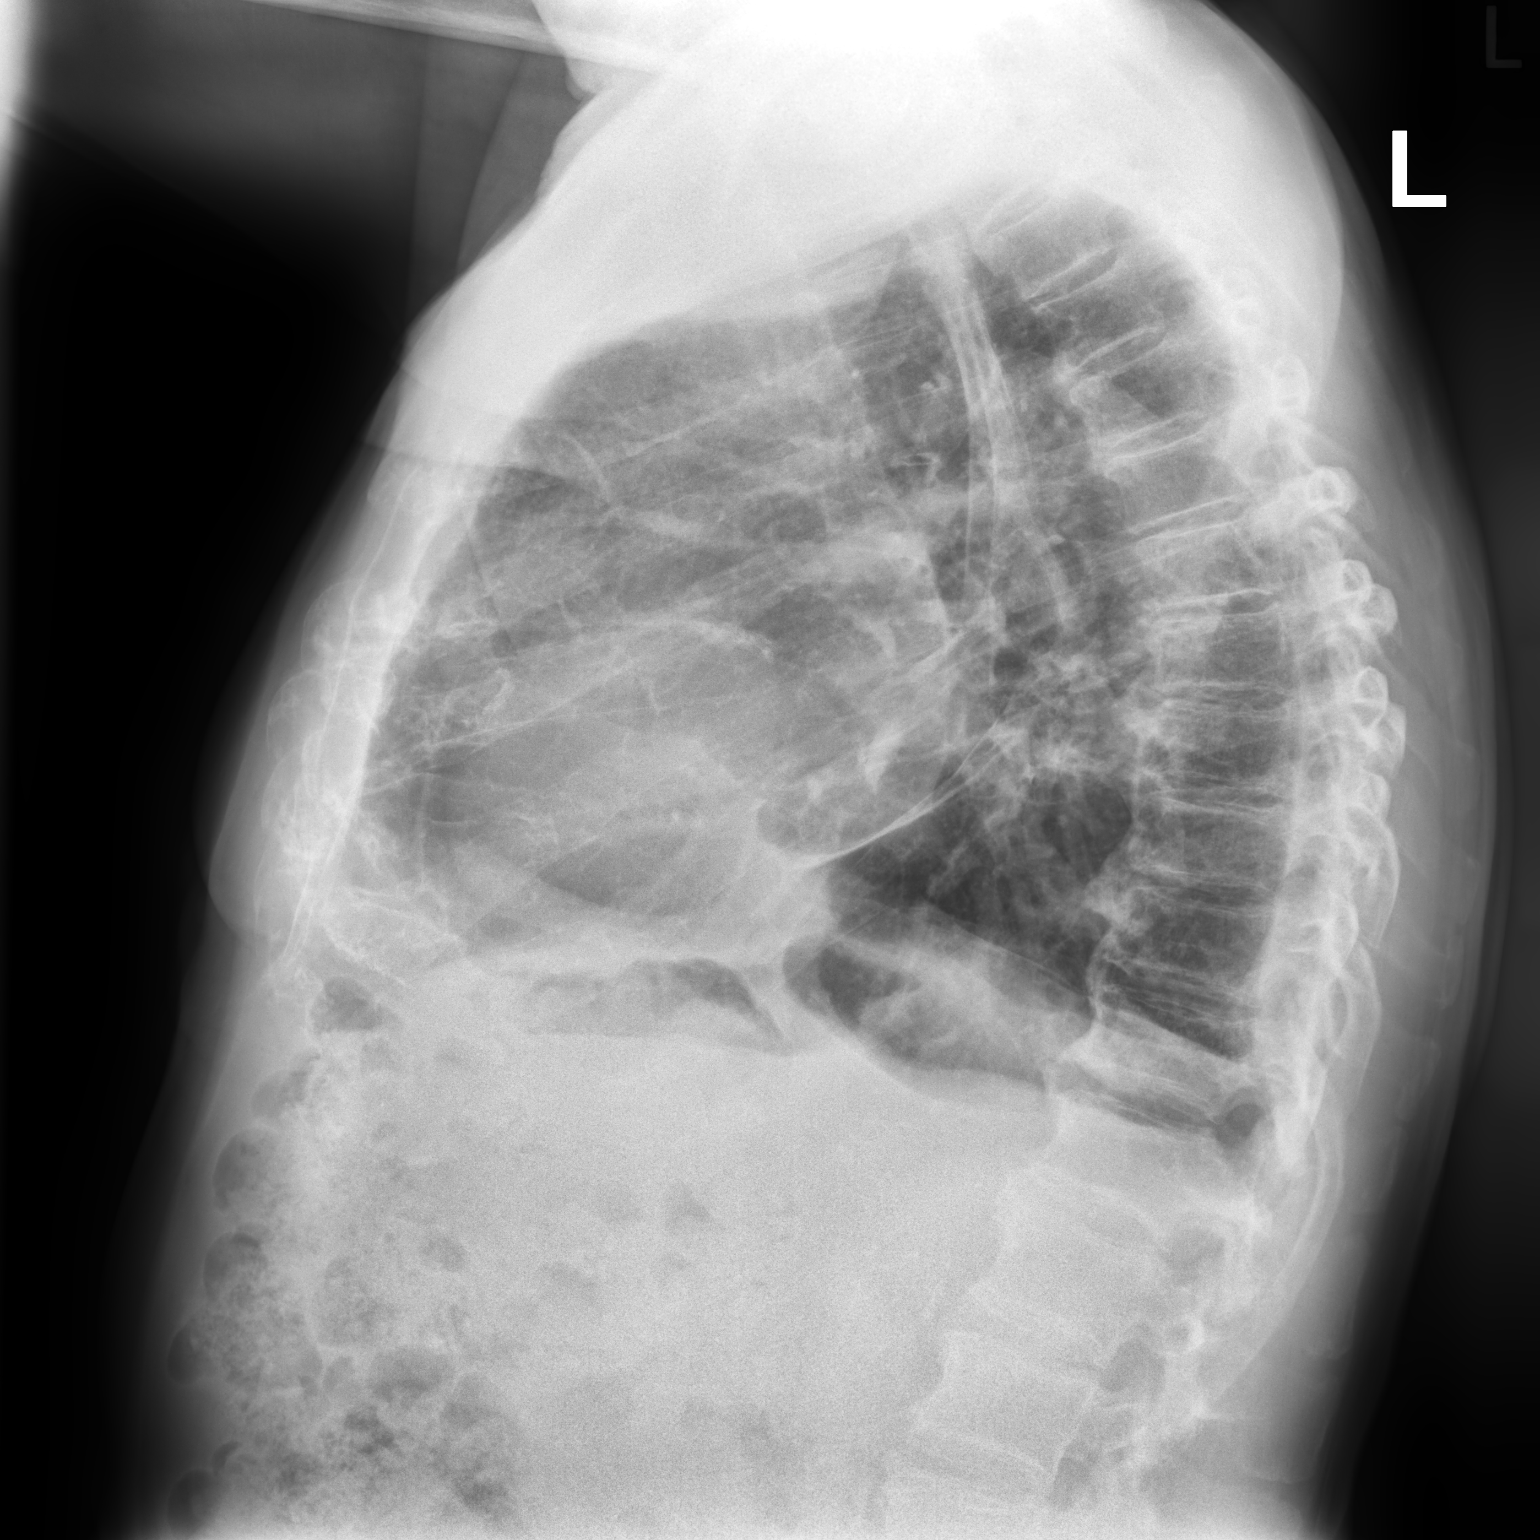

[2 of 2 positions shown; findings below may reference images not displayed]

FINDINGS: Normal heart size, mediastinal contours, and pulmonary vascularity.

Atherosclerotic calcification aorta.

Decreased BILATERAL pleural effusions and basilar atelectasis since
previous study.

Scarring at LEFT upper lobe again seen.

No acute infiltrate or pneumothorax.

Mild degenerative disc disease changes of the thoracic spine.
IMPRESSION: Decreased bibasilar pleural effusions and atelectasis.

Stable LEFT upper lobe scarring.

## 2020-09-04 DIAGNOSIS — N1832 Chronic kidney disease, stage 3b: Secondary | ICD-10-CM | POA: Diagnosis not present

## 2020-09-04 DIAGNOSIS — I5030 Unspecified diastolic (congestive) heart failure: Secondary | ICD-10-CM | POA: Diagnosis not present

## 2020-09-04 DIAGNOSIS — I2510 Atherosclerotic heart disease of native coronary artery without angina pectoris: Secondary | ICD-10-CM | POA: Diagnosis not present

## 2020-09-04 DIAGNOSIS — N400 Enlarged prostate without lower urinary tract symptoms: Secondary | ICD-10-CM | POA: Diagnosis not present

## 2020-09-04 DIAGNOSIS — E875 Hyperkalemia: Secondary | ICD-10-CM | POA: Diagnosis not present

## 2020-09-04 DIAGNOSIS — E785 Hyperlipidemia, unspecified: Secondary | ICD-10-CM | POA: Diagnosis not present

## 2020-09-04 DIAGNOSIS — I129 Hypertensive chronic kidney disease with stage 1 through stage 4 chronic kidney disease, or unspecified chronic kidney disease: Secondary | ICD-10-CM | POA: Diagnosis not present

## 2020-09-04 DIAGNOSIS — E1122 Type 2 diabetes mellitus with diabetic chronic kidney disease: Secondary | ICD-10-CM | POA: Diagnosis not present

## 2020-09-13 ENCOUNTER — Encounter: Payer: Self-pay | Admitting: Family Medicine

## 2020-09-13 ENCOUNTER — Other Ambulatory Visit: Payer: Self-pay | Admitting: *Deleted

## 2020-09-13 ENCOUNTER — Other Ambulatory Visit: Payer: Self-pay

## 2020-09-13 ENCOUNTER — Telehealth (INDEPENDENT_AMBULATORY_CARE_PROVIDER_SITE_OTHER): Payer: Medicare Other | Admitting: Family Medicine

## 2020-09-13 VITALS — BP 126/77

## 2020-09-13 DIAGNOSIS — I10 Essential (primary) hypertension: Secondary | ICD-10-CM

## 2020-09-13 DIAGNOSIS — E1122 Type 2 diabetes mellitus with diabetic chronic kidney disease: Secondary | ICD-10-CM | POA: Diagnosis not present

## 2020-09-13 DIAGNOSIS — N1831 Chronic kidney disease, stage 3a: Secondary | ICD-10-CM

## 2020-09-13 DIAGNOSIS — Z794 Long term (current) use of insulin: Secondary | ICD-10-CM | POA: Diagnosis not present

## 2020-09-13 MED ORDER — LANTUS SOLOSTAR 100 UNIT/ML ~~LOC~~ SOPN: PEN_INJECTOR | SUBCUTANEOUS | 0 refills | Status: DC

## 2020-09-13 MED ORDER — INSULIN LISPRO (1 UNIT DIAL) 100 UNIT/ML (KWIKPEN): PEN_INJECTOR | SUBCUTANEOUS | 5 refills | Status: DC

## 2020-09-13 NOTE — Progress Notes (Signed)
   Subjective:    Patient ID: Jerry Burns, male    DOB: [birth year], 87 y.o.   MRN: [id number]  HPI  Patient calls to discuss recent lab work results. Patient would also like to discuss sliding scale and thinks it may need to be adjusted because his sugar goes up after eating.  Very nice patient Sent [place] his blood pressure readings as well as weight as well as glucose readings.  I was able to review these. Patient is concerned with his evening sugars being significantly elevated therefore he is often adjusting his Lantus to compensate for this which unfortunately can be dangerous Virtual Visit via Telephone Note  I connected with [name] on 09/13/20 at  9:00 AM EST by telephone and verified that I am speaking with the correct person using two identifiers.  Location: Patient: home Provider: office   I discussed the limitations, risks, security and privacy concerns of performing an evaluation and management service by telephone and the availability of in person appointments. I also discussed with the patient that there may be a patient responsible charge related to this service. The patient expressed understanding and agreed to proceed.   History of Present Illness:    Observations/Objective:   Assessment and Plan:   Follow Up Instructions:    I discussed the assessment and treatment plan with the patient. The patient was provided an opportunity to ask questions and all were answered. The patient agreed with the plan and demonstrated an understanding of the instructions.   The patient was advised to call back or seek an in-person evaluation if the symptoms worsen or if the condition fails to improve as anticipated.  I provided 25 minutes of non-face-to-face time during this encounter.      Review of Systems Denies any chest pain shortness of breath    Objective:   Physical Exam   Today's visit was via telephone Physical exam was not possible for this  visit  Lab work was reviewed with the patient Note from his kidney doctor reviewed as well.    Assessment & Plan:  1. Type 2 diabetes mellitus with stage 3a chronic kidney disease, with long-term current use of insulin ([name]) Difficult situation with his kidney functions away they are but patient doing the best he can  Will need to adjust his sliding scale in order to get better control of his readings  Hopefully Lantus can stay more consistent rather than adjusting it on-the-fly from day to day  Patient will send [place] readings every 7 to 14 days for our review    2. Essential hypertension Blood pressure good control

## 2020-09-16 ENCOUNTER — Other Ambulatory Visit: Payer: Self-pay | Admitting: Family Medicine

## 2020-09-22 ENCOUNTER — Telehealth: Payer: Self-pay | Admitting: Family Medicine

## 2020-09-22 NOTE — Telephone Encounter (Signed)
Nurses I reviewed over patient's readings. I also reviewed over his dietary history.  The only change I would recommend currently is increasing long-acting insulin to 25 units.  Please change this within his epic.  Notify patient.  He can send [place] additional readings in 2 weeks time thank you

## 2020-09-23 ENCOUNTER — Other Ambulatory Visit: Payer: Self-pay | Admitting: *Deleted

## 2020-09-23 MED ORDER — LANTUS SOLOSTAR 100 UNIT/ML ~~LOC~~ SOPN: PEN_INJECTOR | SUBCUTANEOUS | 0 refills | Status: DC

## 2020-09-23 NOTE — Telephone Encounter (Signed)
Discussed with pt. Pt verbalized understanding. Med list updated.

## 2020-10-01 ENCOUNTER — Encounter: Payer: Self-pay | Admitting: Internal Medicine

## 2020-10-02 ENCOUNTER — Encounter: Payer: Self-pay | Admitting: Internal Medicine

## 2020-10-03 DIAGNOSIS — E875 Hyperkalemia: Secondary | ICD-10-CM | POA: Diagnosis not present

## 2020-10-03 DIAGNOSIS — N1832 Chronic kidney disease, stage 3b: Secondary | ICD-10-CM | POA: Diagnosis not present

## 2020-10-06 ENCOUNTER — Encounter: Payer: Self-pay | Admitting: Family Medicine

## 2020-10-06 ENCOUNTER — Other Ambulatory Visit: Payer: Self-pay | Admitting: Family Medicine

## 2020-10-06 ENCOUNTER — Telehealth: Payer: Self-pay | Admitting: Family Medicine

## 2020-10-06 DIAGNOSIS — R911 Solitary pulmonary nodule: Secondary | ICD-10-CM | POA: Insufficient documentation

## 2020-10-06 DIAGNOSIS — I700 Atherosclerosis of aorta: Secondary | ICD-10-CM

## 2020-10-06 HISTORY — DX: Atherosclerosis of aorta: I70.0

## 2020-10-06 HISTORY — DX: Solitary pulmonary nodule: R91.1

## 2020-10-06 NOTE — Telephone Encounter (Signed)
Nurses Please let the patient know that I was doing chart review. He had a CT scan in May 2021 through the ER. Showed a very small pulmonary nodule on the right side.  By standards and protocol it is recommended to do a repeat CT scan of the chest in early May to make sure that this is not changing. Please set him up for a CT scan without contrast of the chest attention right pulmonary nodule

## 2020-10-07 NOTE — Telephone Encounter (Signed)
Patient states he had a terrible reaction to contrast when he had that CT scan done and it lock up his kidneys and caused all his kidney problems and is very hesitant to do another CT scan.

## 2020-10-10 ENCOUNTER — Other Ambulatory Visit: Payer: Self-pay | Admitting: Family Medicine

## 2020-10-10 DIAGNOSIS — E1122 Type 2 diabetes mellitus with diabetic chronic kidney disease: Secondary | ICD-10-CM

## 2020-10-10 DIAGNOSIS — N1831 Chronic kidney disease, stage 3a: Secondary | ICD-10-CM

## 2020-10-11 ENCOUNTER — Other Ambulatory Visit: Payer: Self-pay | Admitting: Family Medicine

## 2020-10-14 NOTE — Telephone Encounter (Signed)
This CT scan is without contrast so it should have no effect on his kidneys But in my opinion it is necessary to make sure that the pulmonary nodule has not changed If he agrees please set it up If he disagrees please set him an appointment for April to discuss further

## 2020-10-15 NOTE — Addendum Note (Signed)
Addended by: Vicente Males on: 10/15/2020 08:41 AM   Modules accepted: Orders

## 2020-10-15 NOTE — Telephone Encounter (Signed)
Pt contacted. Pt states he is willing to do CT without contrast. CT without contrast ordered.

## 2020-10-16 ENCOUNTER — Other Ambulatory Visit: Payer: Self-pay

## 2020-10-16 ENCOUNTER — Telehealth (INDEPENDENT_AMBULATORY_CARE_PROVIDER_SITE_OTHER): Payer: Medicare Other | Admitting: Cardiology

## 2020-10-16 ENCOUNTER — Encounter: Payer: Self-pay | Admitting: Cardiology

## 2020-10-16 ENCOUNTER — Telehealth: Payer: Self-pay | Admitting: Cardiology

## 2020-10-16 VITALS — BP 140/75 | Ht 72.0 in | Wt 200.0 lb

## 2020-10-16 DIAGNOSIS — I2510 Atherosclerotic heart disease of native coronary artery without angina pectoris: Secondary | ICD-10-CM

## 2020-10-16 DIAGNOSIS — I5032 Chronic diastolic (congestive) heart failure: Secondary | ICD-10-CM | POA: Diagnosis not present

## 2020-10-16 NOTE — Patient Instructions (Signed)

## 2020-10-16 NOTE — Progress Notes (Signed)
Virtual Visit via Telephone Note   This visit type was conducted due to national recommendations for restrictions regarding the COVID-19 Pandemic (e.g. social distancing) in an effort to limit this patient's exposure and mitigate transmission in our community.  Due to his co-morbid illnesses, this patient is at least at moderate risk for complications without adequate follow up.  This format is felt to be most appropriate for this patient at this time.  The patient did not have access to video technology/had technical difficulties with video requiring transitioning to audio format only (telephone).  All issues noted in this document were discussed and addressed.  No physical exam could be performed with this format.  Please refer to the patient's chart for his  consent to telehealth for [facility].    Date:  10/16/2020   ID:  Jerry Burns, DOB [birth year], MRN [id number] The patient was identified using 2 identifiers.  Patient Location: Home Provider Location: Office/Clinic   PCP:  Kathyrn Drown, MD   [place]  Cardiologist:  Kate Sable, MD (Inactive) Dr Carlyle Dolly MD Advanced Practice Provider:  No care team member to display Electrophysiologist:  None  [phone]   Evaluation Performed:  Follow-Up Visit  Chief Complaint:  Follow up  History of Present Illness:    Jerry Burns is a 87 y.o. male seen today for follow up of the following medical problems.   1. CAD  - prior DES to prox LAD in 2016, repeat cath 2018 with patent stent -no recent chest pain, no SOB/DOE - compliant with meds  2. Chronic diastolic HF - 12/1948 echo LVEF 55-60%, normal RV function, indet DDx. Small to mod pericardial effusion  - occasional edema, improves with torsemide. Taking torsemide about every other day.   3. Pericardial effusion - small to moderate, no signs of tamponade  4. CKD 3 - followed by nephrology    The patient does not  have symptoms concerning for COVID-19 infection (fever, chills, cough, or new shortness of breath).    Past Medical History:  Diagnosis Date  . Anginal pain ([place])   . Aortic atherosclerosis ([place]) 10/06/2020   Seen on CAT scan 2021  . Arthritis    Bilateral knee  . CHF (congestive heart failure) ([place])   . Coronary artery disease   . Diabetes mellitus    insulin dependent   . DVT, lower extremity, distal, acute ([place]) 09/09/2018  . Hyperlipidemia   . Hypertension   . Progressive angina ([place]) 03/2015   a. s/p DES to proximal LAD and balloon angioplasty to distal LAD in 2016 b. patent stent by repeat cath in 01/2017 with mild residual CAD)  . Pulmonary nodule 10/06/2020   Seen on CAT scan 2020 1 repeat CAT scan in 2022  . Stroke [facility])    Past Surgical History:  Procedure Laterality Date  . APPENDECTOMY    . CARDIAC CATHETERIZATION N/A 04/12/2015   Procedure: Left Heart Cath and Coronary Angiography;  Surgeon: Sherren Mocha, MD; pLAD 75>0% w/ 3.0x12 mm Resolute DES, dLAD 80>0% w/ Angiosculpt scoring balloon, CFX luminal irreg, RCA mild dz, EF 55-65%   . COLONOSCOPY  2011   RMR: 1. Normal rectum 2. Left sided diverticula , 2 ascending colon polyps, status post snare polypectomy. Remainder of colonic mucosa appeared unremarkable.   . COLONOSCOPY N/A 05/13/2016   Procedure: COLONOSCOPY;  Surgeon: Daneil Dolin, MD;  Location: [facility];  Service: Endoscopy;  Laterality: N/A;  7:30 AM  .  COLONOSCOPY N/A 05/18/2018   [place]k: Diverticulosis, single tubular adenoma removed.  No plans for future screening or surveillance colonoscopy due to age.  . CORONARY ANGIOGRAPHY N/A 02/04/2017   Procedure: CORONARY ANGIOGRAPHY (CATH LAB);  Surgeon: Troy Sine, MD;  Location: [place] CV LAB;  Service: Cardiovascular;  Laterality: N/A;  . CORONARY ANGIOPLASTY WITH STENT PLACEMENT  08/22/2014   PTCA/DES X mLAD with 2.5 x 16 mm Promus Premier DES by Dr Julianne Handler  . ESOPHAGOGASTRODUODENOSCOPY N/A  05/18/2018   [place]k: Ulcerative reflux esophagitis, hiatal hernia, erosive gastropathy with H. pylori on biopsy, also in the setting of NSAID use.  Marland Kitchen FOOT SURGERY Right   . LEFT HEART CATHETERIZATION WITH CORONARY ANGIOGRAM N/A 08/22/2014   Procedure: LEFT HEART CATHETERIZATION WITH CORONARY ANGIOGRAM;  Surgeon: Burnell Blanks, MD;  Location: [facility] CATH LAB;  Service: Cardiovascular;  Laterality: N/A;  . PERCUTANEOUS CORONARY STENT INTERVENTION (PCI-S)  08/22/2014   Procedure: PERCUTANEOUS CORONARY STENT INTERVENTION (PCI-S);  Surgeon: Burnell Blanks, MD;   . SHOULDER SURGERY    . TONSILLECTOMY       No outpatient medications have been marked as taking for the 10/16/20 encounter (Appointment) with Arnoldo Lenis, MD.     Allergies:   Adhesive [tape], Sulfa antibiotics, and Latex   Social History   Tobacco Use  . Smoking status: Former Smoker    Packs/day: 1.00    Years: 20.00    Pack years: 20.00    Types: Cigarettes, Cigars    Start date: 08/10/1955    Quit date: 07/28/2007    Years since quitting: 13.2  . Smokeless tobacco: Never Used  Vaping Use  . Vaping Use: Never used  Substance Use Topics  . Alcohol use: Not Currently    Alcohol/week: 0.0 standard drinks    Comment: Daily about 1 glass of wine and gin  . Drug use: No     Family Hx: The patient's family history includes Colon cancer in his maternal uncle; Diabetes in his brother, father, and another family member; Hypertension in his sister; Skin cancer in his mother and sister.  ROS:   Please see the history of present illness.     All other systems reviewed and are negative.   Prior CV studies:   The following studies were reviewed today:   Echocardiogram 12/15/19:  1. Left ventricular ejection fraction, by estimation, is55%.The left  ventricle has normal function. The left ventricle demonstrates regional  wall motion abnormalities (see scoring diagram/findings for description).  Left  ventricular diastolic  parameters are indeterminate.  2. Right ventricular systolic function is normal. The right ventricular  size is normal. There is mildly elevated pulmonary artery systolic  pressure. The estimated right ventricular systolic pressure is 43.3 mmHg.  3. Left atrial size was moderately dilated.  4. Moderate pericardial effusion.The pericardial effusion is  circumferential with anterior predominance. There is evidence of  organization and potential chronicity. No clear right ventricular  compromise or diagnostic variation in mitral outflow to  suggest tamponade physiology.  5. The mitral valve is grossly normal. Mild mitral valve regurgitation.  6. The aortic valve is tricuspid. Aortic valve regurgitation is not  visualized.  7. The inferior vena cava is dilated in size with <50% respiratory  variability, suggesting right atrial pressure of 15 mmHg.   NST: 12/2016  There was no ST segment deviation noted during stress.  Findings consistent with prior inferior/inferoseptal/inferoapical myocardial infarction with moderate peri-infarct ischemia.  This is an intermediate risk study.  The  left ventricular ejection fraction is normal (55-65%).  Cardiac Catheterization: 01/2017  Prox RCA lesion, 15 %stenosed.  Dist RCA lesion, 20 %stenosed.  1st Diag lesion, 20 %stenosed.  Ost LAD-1 lesion, 40 %stenosed.  Ost LAD-2 lesion, 0 %stenosed.  Dist LAD-1 lesion, 30 %stenosed.  Dist LAD-2 lesion, 15 %stenosed.  Mild residual CAD with a patent proximal LAD stent and 30-40% narrowing immediately proximal to the stented segment, 20% proximal diagonal stenosis, 30% mid LAD stenosis, patent distal LAD stent with 15% intimal hyperplasia; normal left circumflex system; mild 20% proximal and distal stenoses in a dominant RCA.  RECOMMENDATION: Medical therapy. Recommend echo Doppler study for LV function assessment since despite prolonged attempt at crossing the  aortic valve with a catheter due to the significant tortuosity and left ventriculography was not able to be performed during this procedure.  Labs/Other Tests and Data Reviewed:    EKG:  No ECG reviewed.  Recent Labs: 11/30/2019: B Natriuretic Peptide 119.0; TSH 4.149 01/01/2020: Magnesium 2.2 06/25/2020: ALT 34; Hemoglobin 12.3; Platelets 171 08/27/2020: BUN 51; Creatinine, Ser 1.69; Potassium 5.6; Sodium 140   Recent Lipid Panel Lab Results  Component Value Date/Time   CHOL 139 08/27/2020 07:41 AM   TRIG 90 08/27/2020 07:41 AM   HDL 44 08/27/2020 07:41 AM   CHOLHDL 3.2 08/27/2020 07:41 AM   CHOLHDL 2.9 07/31/2014 07:03 AM   LDLCALC 78 08/27/2020 07:41 AM    Wt Readings from Last 3 Encounters:  07/05/20 211 lb 2 oz (95.8 kg)  06/14/20 209 lb (94.8 kg)  04/16/20 206 lb 3.2 oz (93.5 kg)     Risk Assessment/Calculations:      Objective:    Vital Signs:   Today's Vitals   10/16/20 1221  BP: 140/75  Weight: 200 lb (90.7 kg)  Height: 6' (1.829 m)   Body mass index is 27.12 kg/m. Normal affect. Normal speech pattern and tone. Comfortable, no apparent distress. No Audible signs of sob or wheezing.   ASSESSMENT & PLAN:    1. CAD - denies symptoms, continue current meds  2. Chronic diastolic HF - reports controlled on torsemide, limiting dosing due to coexisting CKD - continue current therapy         COVID-19 Education: The signs and symptoms of COVID-19 were discussed with the patient and how to seek care for testing (follow up with PCP or arrange E-visit).  The importance of social distancing was discussed today.  Time:   Today, I have spent 17 minutes with the patient with telehealth technology discussing the above problems.     Medication Adjustments/Labs and Tests Ordered: Current medicines are reviewed at length with the patient today.  Concerns regarding medicines are outlined above.   Tests Ordered: No orders of the defined types were placed in this  encounter.   Medication Changes: No orders of the defined types were placed in this encounter.   Follow Up:  In Person in 6 month(s)  Signed, Carlyle Dolly, MD  10/16/2020 12:15 PM    [place]

## 2020-10-16 NOTE — Telephone Encounter (Signed)
  Patient Consent for Virtual Visit         Jerry Burns has provided verbal consent on 10/16/2020 for a virtual visit (video or telephone).   CONSENT FOR VIRTUAL VISIT FOR:  Jerry Burns  By participating in this virtual visit I agree to the following:  I hereby voluntarily request, consent and authorize [facility] and its employed or contracted physicians, physician assistants, nurse practitioners or other licensed health care professionals (the Practitioner), to provide me with telemedicine health care services (the "Services") as deemed necessary by the treating Practitioner. I acknowledge and consent to receive the Services by the Practitioner via telemedicine. I understand that the telemedicine visit will involve communicating with the Practitioner through live audiovisual communication technology and the disclosure of certain medical information by electronic transmission. I acknowledge that I have been given the opportunity to request an in-person assessment or other available alternative prior to the telemedicine visit and am voluntarily participating in the telemedicine visit.  I understand that I have the right to withhold or withdraw my consent to the use of telemedicine in the course of my care at any time, without affecting my right to future care or treatment, and that the Practitioner or I may terminate the telemedicine visit at any time. I understand that I have the right to inspect all information obtained and/or recorded in the course of the telemedicine visit and may receive copies of available information for a reasonable fee.  I understand that some of the potential risks of receiving the Services via telemedicine include:  Marland Kitchen Delay or interruption in medical evaluation due to technological equipment failure or disruption; . Information transmitted may not be sufficient (e.g. poor resolution of images) to allow for appropriate medical decision making by the  Practitioner; and/or  . In rare instances, security protocols could fail, causing a breach of personal health information.  Furthermore, I acknowledge that it is my responsibility to provide information about my medical history, conditions and care that is complete and accurate to the best of my ability. I acknowledge that Practitioner's advice, recommendations, and/or decision may be based on factors not within their control, such as incomplete or inaccurate data provided by me or distortions of diagnostic images or specimens that may result from electronic transmissions. I understand that the practice of medicine is not an exact science and that Practitioner makes no warranties or guarantees regarding treatment outcomes. I acknowledge that a copy of this consent can be made available to me via my patient portal ([facility]), or I can request a printed copy by calling the office of [facility].    I understand that my insurance will be billed for this visit.   I have read or had this consent read to me. . I understand the contents of this consent, which adequately explains the benefits and risks of the Services being provided via telemedicine.  . I have been provided ample opportunity to ask questions regarding this consent and the Services and have had my questions answered to my satisfaction. . I give my informed consent for the services to be provided through the use of telemedicine in my medical care

## 2020-10-20 ENCOUNTER — Telehealth: Payer: Self-pay | Admitting: Family Medicine

## 2020-10-20 DIAGNOSIS — N1831 Chronic kidney disease, stage 3a: Secondary | ICD-10-CM

## 2020-10-20 DIAGNOSIS — Z794 Long term (current) use of insulin: Secondary | ICD-10-CM

## 2020-10-20 NOTE — Telephone Encounter (Signed)
Nurses Please connect with patient I reviewed over his glucose readings.  I would keep everything as is right now. First week of May please do A1c, met 7, urine ACR Please follow-up office visit in May

## 2020-10-21 NOTE — Telephone Encounter (Signed)
Blood work ordered in Epic. Patient notified. 

## 2020-10-21 NOTE — Addendum Note (Signed)
Addended by: Dairl Ponder on: 10/21/2020 11:11 AM   Modules accepted: Orders

## 2020-10-22 DIAGNOSIS — N1831 Chronic kidney disease, stage 3a: Secondary | ICD-10-CM | POA: Diagnosis not present

## 2020-10-22 DIAGNOSIS — Z794 Long term (current) use of insulin: Secondary | ICD-10-CM | POA: Diagnosis not present

## 2020-10-22 DIAGNOSIS — E1122 Type 2 diabetes mellitus with diabetic chronic kidney disease: Secondary | ICD-10-CM | POA: Diagnosis not present

## 2020-10-23 LAB — BASIC METABOLIC PANEL
BUN/Creatinine Ratio: 28 — ABNORMAL HIGH (ref 10–24)
BUN: 40 mg/dL — ABNORMAL HIGH (ref 8–27)
CO2: 22 mmol/L (ref 20–29)
Calcium: 9.8 mg/dL (ref 8.6–10.2)
Chloride: 100 mmol/L (ref 96–106)
Creatinine, Ser: 1.41 mg/dL — ABNORMAL HIGH (ref 0.76–1.27)
Glucose: 213 mg/dL — ABNORMAL HIGH (ref 65–99)
Potassium: 5 mmol/L (ref 3.5–5.2)
Sodium: 140 mmol/L (ref 134–144)
eGFR: 49 mL/min/{1.73_m2} — ABNORMAL LOW (ref >59)

## 2020-10-23 LAB — MICROALBUMIN / CREATININE URINE RATIO
Creatinine, Urine: 32.3 mg/dL
Microalb/Creat Ratio: 77 mg/g creat — ABNORMAL HIGH (ref 0–29)
Microalbumin, Urine: 24.8 ug/mL

## 2020-10-23 LAB — HEMOGLOBIN A1C
Est. average glucose Bld gHb Est-mCnc: 189 mg/dL
Hgb A1c MFr Bld: 8.2 % — ABNORMAL HIGH (ref 4.8–5.6)

## 2020-10-24 DIAGNOSIS — N1832 Chronic kidney disease, stage 3b: Secondary | ICD-10-CM | POA: Diagnosis not present

## 2020-10-28 ENCOUNTER — Other Ambulatory Visit: Payer: Self-pay | Admitting: Family Medicine

## 2020-10-29 ENCOUNTER — Ambulatory Visit (INDEPENDENT_AMBULATORY_CARE_PROVIDER_SITE_OTHER): Payer: Medicare Other | Admitting: Family Medicine

## 2020-10-29 ENCOUNTER — Encounter: Payer: Self-pay | Admitting: Family Medicine

## 2020-10-29 ENCOUNTER — Other Ambulatory Visit: Payer: Self-pay

## 2020-10-29 VITALS — BP 133/71 | HR 65 | Temp 97.4°F | Ht 72.0 in | Wt 210.0 lb

## 2020-10-29 DIAGNOSIS — R21 Rash and other nonspecific skin eruption: Secondary | ICD-10-CM

## 2020-10-29 DIAGNOSIS — W57XXXA Bitten or stung by nonvenomous insect and other nonvenomous arthropods, initial encounter: Secondary | ICD-10-CM | POA: Insufficient documentation

## 2020-10-29 DIAGNOSIS — B49 Unspecified mycosis: Secondary | ICD-10-CM | POA: Diagnosis not present

## 2020-10-29 MED ORDER — DOXYCYCLINE HYCLATE 100 MG PO TABS: 100.0000 mg | ORAL_TABLET | Freq: Two times a day (BID) | ORAL | 0 refills | Status: DC

## 2020-10-29 MED ORDER — KETOCONAZOLE 2 % EX CREA: 1.0000 "application " | TOPICAL_CREAM | Freq: Every day | CUTANEOUS | 0 refills | Status: DC

## 2020-10-29 NOTE — Progress Notes (Signed)
Patient ID: Jerry Burns, male    DOB: [birth year], 87 y.o.   MRN: [id number]   No chief complaint on file.  Subjective:  CC: rash in privates area  Symptoms have beenThis is a new problem.  Presents today for an acute visit with rash in the private area.  Is it for 5 to 6 days.  Has tried antibiotic ointment, and Tinactin with some improvement.  Reports that he also pulled a tick off in the groin area prior to symptoms started.  Denies fever, chills, chest pain, shortness of breath.   rash in private area for 5 -6 days. Tried everything in the medicine cabinet on it.    Medical History Dayten has a past medical history of Anginal pain ([place]), Aortic atherosclerosis ([place]) (10/06/2020), Arthritis, CHF (congestive heart failure) ([place]), Coronary artery disease, Diabetes mellitus, DVT, lower extremity, distal, acute ([place]) (09/09/2018), Hyperlipidemia, Hypertension, Progressive angina ([place]) (03/2015), Pulmonary nodule (10/06/2020), and Stroke ([place]).   Outpatient Encounter Medications as of 10/29/2020  Medication Sig  . Ascorbic Acid (VITAMIN C) 1000 MG tablet Take 1,000 mg by mouth daily.   Marland Kitchen aspirin EC 81 MG tablet Take 81 mg by mouth daily.  Marland Kitchen atorvastatin (LIPITOR) 80 MG tablet TAKE (1) TABLET BY MOUTH ONCE DAILY.  Marland Kitchen B-D UF III MINI PEN NEEDLES 31G X 5 MM MISC SMARTSIG:1 Each SUB-Q Twice Daily  . carvedilol (COREG) 12.5 MG tablet Take 1 tablet (12.5 mg total) by mouth 2 (two) times daily.  . COMBIGAN 0.2-0.5 % ophthalmic solution Place 1 drop into the left eye 2 (two) times daily.   . Cyanocobalamin (B-12) 2500 MCG TABS Take 1 tablet by mouth 2 (two) times a week. Three times a week on Monday Wednesday and Friday  . doxycycline (VIBRA-TABS) 100 MG tablet Take 1 tablet (100 mg total) by mouth 2 (two) times daily.  . Emollient (CERAVE) CREA Apply 1 application topically daily at 6 (six) AM.  . empagliflozin (JARDIANCE) 10 MG TABS tablet Take 10 mg by mouth daily.  Marland Kitchen erythromycin ophthalmic  ointment Place 1 application into the left eye 3 (three) times daily as needed (infection).   . Flaxseed, Linseed, (FLAXSEED OIL PO) Take 1 capsule by mouth daily.   Marland Kitchen glucose blood (CONTOUR NEXT TEST) test strip USE 1 STRIP  TO CHECK GLUCOSE 3 TIMES DAILY  . insulin glargine (LANTUS SOLOSTAR) 100 UNIT/ML Solostar Pen INJECT 22 UNITS SUBCUTANEOUSLY ONCE DAILY AT  10  PM  . insulin lispro (HUMALOG KWIKPEN) 100 UNIT/ML KwikPen Inject twice daily per following: Breakfast: <130, 2 units; 130 -160, 3 units: 161-220, 4 units: >220, 6 units. Supper: <110, 3 units, 111-160, 4 units: 161-220, 6 units: >220, 8 units  . ketoconazole (NIZORAL) 2 % cream Apply 1 application topically daily.  . Lactobacillus (PROBIOTIC ACIDOPHILUS) CAPS Take 1 capsule by mouth daily at 6 (six) AM.  . mometasone (ELOCON) 0.1 % cream APPLY TO AFFECTED AREAS ONCE DAILY.  . Multiple Vitamin (MULTIVITAMIN) tablet Take 1 tablet by mouth daily.  . nitroGLYCERIN (NITROSTAT) 0.4 MG SL tablet PLACE 1 TAB UNDER TONGUE EVERY 5 MIN IF NEEDED FOR CHEST PAIN. MAY USE 3 TIMES.NO RELIEF CALL 911.  . polyethylene glycol powder (MIRALAX) 17 GM/SCOOP powder Start by taking 1/2 capful mixed in 4 ounces of fluid daily. You can increase to 1 capful daily if needed to maintain easy BMs.  . Polyvinyl Alcohol-Povidone (REFRESH OP) Place 1 drop into both ears 2 (two) times daily as needed (dryness).   Marland Kitchen  sitaGLIPtin (JANUVIA) 100 MG tablet TAKE ONE TABLET BY MOUTH ONCE DAILY.  . tamsulosin (FLOMAX) 0.4 MG CAPS capsule TAKE (1) CAPSULE BY MOUTH ONCE DAILY.  Marland Kitchen torsemide (DEMADEX) 20 MG tablet Take 20 mg by mouth 2 (two) times daily.  . Turmeric 500 MG CAPS Take 500 mg by mouth daily.  . VELTASSA 8.4 g packet Take 1 packet by mouth daily.   No facility-administered encounter medications on file as of 10/29/2020.     Review of Systems  Constitutional: Negative for chills and fever.  Respiratory: Negative for shortness of breath.   Cardiovascular: Negative  for chest pain.  Skin: Positive for rash.       Privates area  Neurological: Negative for headaches.     Vitals BP 133/71   Pulse 65   Temp (!) 97.4 F (36.3 C)   Ht 6' (1.829 m)   Wt 210 lb (95.3 kg)   SpO2 98%   BMI 28.48 kg/m   Objective:   Physical Exam Vitals reviewed.  Cardiovascular:     Rate and Rhythm: Normal rate and regular rhythm.     Heart sounds: Normal heart sounds.  Pulmonary:     Effort: Pulmonary effort is normal.     Breath sounds: Normal breath sounds.  Skin:    General: Skin is warm and dry.     Comments: Groin rash.   Neurological:     General: No focal deficit present.     Mental Status: He is alert.  Psychiatric:        Behavior: Behavior normal.      Assessment and Plan   1. Tick bite, unspecified site, initial encounter - doxycycline (VIBRA-TABS) 100 MG tablet; Take 1 tablet (100 mg total) by mouth 2 (two) times daily.  Dispense: 20 tablet; Refill: 0  2. Fungal infection - ketoconazole (NIZORAL) 2 % cream; Apply 1 application topically daily.  Dispense: 15 g; Refill: 0   Unsure how long tick present before removal, will treat with doxycycline twice per day for 10 days.  Rash also appears to fungal in nature, will treat with ketoconazole topically daily.   Agrees with plan of care discussed today. Understands warning signs to seek further care: chest pain, shortness of breath, any significant change in health.  Understands to follow-up if symptoms worsen or do not improve.    Pecolia Ades, NP 10/29/2020

## 2020-10-29 NOTE — Patient Instructions (Signed)
Jock Itch  Jock itch is an itchy rash in the groin and upper thigh area. It is a skin infection that is caused by a type of germ that lives in dark, damp places (fungus). The rash usually goes away in 2-3 weeks with treatment. Follow these instructions at home: Skin care  Use skin creams, ointments, or powders exactly as told by your doctor.  Wear loose-fitting clothes. Clothes should not rub against your groin area. Men should wear boxer shorts or loose-fitting underwear.  Keep your groin area clean and dry. ? Change your underwear every day. ? Change out of wet bathing suits as soon as you can. ? After bathing, use a separate towel to dry your groin area. Dry the area gently and completely.  Avoid hot baths and showers. Hot water can make itching worse.  Do not scratch the area. General instructions  Take and apply over-the-counter and prescription medicines only as told by your doctor.  Do not share towels or clothing with other people.  Wash your hands often with soap and water, especially after touching your groin area. If you do not have soap and water, use alcohol-based hand sanitizer. Contact a doctor if:  Your rash: ? Gets worse. ? Does not get better after 2 weeks of treatment. ? Spreads. ? Comes back after treatment is done.  You have any of the following: ? A fever. ? New or worsening redness, swelling, or pain around your rash. ? Fluid, blood, or pus coming from your rash. Summary  Jock itch is an itchy rash. It affects the groin and upper thigh area.  Jock itch usually goes away in 2-3 weeks with treatment.  Keep your groin area clean and dry. This information is not intended to replace advice given to you by your health care provider. Make sure you discuss any questions you have with your health care provider. Document Revised: 05/08/2020 Document Reviewed: 05/08/2020 Elsevier Patient Education  2021 [employer].

## 2020-11-11 ENCOUNTER — Other Ambulatory Visit: Payer: Self-pay | Admitting: Family Medicine

## 2020-11-13 ENCOUNTER — Encounter: Payer: Self-pay | Admitting: Family Medicine

## 2020-11-15 ENCOUNTER — Encounter (HOSPITAL_COMMUNITY): Payer: Self-pay

## 2020-11-15 ENCOUNTER — Other Ambulatory Visit: Payer: Self-pay

## 2020-11-15 ENCOUNTER — Ambulatory Visit (HOSPITAL_COMMUNITY): Admission: RE | Admit: 2020-11-15 | Payer: Medicare Other | Source: Ambulatory Visit

## 2020-11-18 ENCOUNTER — Ambulatory Visit (HOSPITAL_COMMUNITY)
Admission: RE | Admit: 2020-11-18 | Discharge: 2020-11-18 | Disposition: A | Discharge status: Home / Self Care | Payer: Medicare Other | Source: Ambulatory Visit | Attending: Family Medicine | Admitting: Family Medicine

## 2020-11-18 ENCOUNTER — Other Ambulatory Visit: Payer: Self-pay

## 2020-11-18 DIAGNOSIS — R911 Solitary pulmonary nodule: Secondary | ICD-10-CM | POA: Diagnosis present

## 2020-11-19 ENCOUNTER — Telehealth: Payer: Self-pay | Admitting: Family Medicine

## 2020-11-19 ENCOUNTER — Encounter: Payer: Self-pay | Admitting: Gastroenterology

## 2020-11-19 ENCOUNTER — Ambulatory Visit (INDEPENDENT_AMBULATORY_CARE_PROVIDER_SITE_OTHER): Payer: Medicare Other | Admitting: Gastroenterology

## 2020-11-19 VITALS — BP 123/72 | HR 70 | Temp 97.1°F | Ht 72.0 in | Wt 209.2 lb

## 2020-11-19 DIAGNOSIS — D649 Anemia, unspecified: Secondary | ICD-10-CM | POA: Diagnosis not present

## 2020-11-19 DIAGNOSIS — K5900 Constipation, unspecified: Secondary | ICD-10-CM

## 2020-11-19 DIAGNOSIS — I2510 Atherosclerotic heart disease of native coronary artery without angina pectoris: Secondary | ICD-10-CM

## 2020-11-19 NOTE — Progress Notes (Signed)
Primary Care Physician: Kathyrn Drown, MD  Primary Gastroenterologist:  Garfield Cornea, MD   Chief Complaint  Patient presents with  . Constipation    He is taking equate fiber and BM's are at least daily now. No straining no bleeding    HPI: Jerry Burns is a 87 y.o. male here for follow-up.  Last seen in September 2021.History of anemia, ascites, constipation, history of H. pylori status post treatment/proven eradication.  Past medical history significant for multifocal pneumonia, diastolic CHF, CAD, CVA, type 2 diabetes, hypertension, chronic kidney disease stage IIIa, chronic anemia followed by hematology (last iron infusions back in March), previous DVT with presumed PE in February 2020 treated with Eliquis.    From a GI standpoint, patient reports that he is doing fairly well.  Currently using equate fiber supplement daily.  Has a bowel movement every day.  Only "minor" complaint, has to return to the restroom 2-3 times to complete BM.  No melena or rectal bleeding.  No abdominal pain.  No upper GI symptoms.  States he has a follow-up with hematology in about 6 weeks.  Will have updated anemia labs at that time. Denies edema issues.   Previous work-up:  He had a CT angio chest on Nov 26, 2019.He had multifocal pneumonia, tiny right pleural effusion, asbestos related pleural disease, new small pericardial effusion. 2-3 right middle lobe nodule requiring one year follow up. No PE. CT A/P with contrast same day showed generalized fat stranding ruq appears centered on the duodenum suspicious for duodenitis, nondistended gb with suggestion of mild wall thickening and pericholecystic edema may be reactive related to duodenal inflammation. Small amount of upper abdomen ascites. Bilateral inguinal hernias contain ascites. Mild bladder wall thickening can be seen with UTI. Liver measuring 20cm.   Abd u/s 11/30/19 with decompressed gb, nonspecific gb wall thickening. Small volume  ascites.   Last EGD and colonoscopy September 2019 showed rather severe ulcerative reflux esophagitis, H. pylori gastritis.  Colonic diverticulosis, single tubular adenoma.  No plans for future colonoscopy for screening/surveillance.  Current Outpatient Medications  Medication Sig Dispense Refill  . Ascorbic Acid (VITAMIN C) 1000 MG tablet Take 1,000 mg by mouth daily.     Marland Kitchen aspirin EC 81 MG tablet Take 81 mg by mouth daily.    Marland Kitchen atorvastatin (LIPITOR) 80 MG tablet TAKE (1) TABLET BY MOUTH ONCE DAILY. 90 tablet 0  . B-D UF III MINI PEN NEEDLES 31G X 5 MM MISC SMARTSIG:1 Each SUB-Q Twice Daily    . carvedilol (COREG) 12.5 MG tablet Take 1 tablet (12.5 mg total) by mouth 2 (two) times daily. 180 tablet 3  . COMBIGAN 0.2-0.5 % ophthalmic solution Place 1 drop into the left eye 2 (two) times daily.   2  . Cyanocobalamin (B-12) 2500 MCG TABS Take 1 tablet by mouth 2 (two) times a week. Three times a week on Monday Wednesday and Friday    . Emollient (CERAVE) CREA Apply 1 application topically daily at 6 (six) AM.    . empagliflozin (JARDIANCE) 10 MG TABS tablet Take 10 mg by mouth daily.    Marland Kitchen erythromycin ophthalmic ointment Place 1 application into the left eye 3 (three) times daily as needed (infection).     . Flaxseed, Linseed, (FLAXSEED OIL PO) Take 1 capsule by mouth daily.     Marland Kitchen glucose blood (CONTOUR NEXT TEST) test strip USE 1 STRIP  TO CHECK GLUCOSE 3 TIMES DAILY 300 each 1  .  insulin glargine (LANTUS SOLOSTAR) 100 UNIT/ML Solostar Pen INJECT 22 UNITS SUBCUTANEOUSLY ONCE DAILY AT  10  PM (Patient taking differently: INJECT 24-25 UNITS SUBCUTANEOUSLY ONCE DAILY AT  10  PM) 15 mL 0  . insulin lispro (HUMALOG KWIKPEN) 100 UNIT/ML KwikPen Inject twice daily per following: Breakfast: <130, 2 units; 130 -160, 3 units: 161-220, 4 units: >220, 6 units. Supper: <110, 3 units, 111-160, 4 units: 161-220, 6 units: >220, 8 units 15 mL 5  . ketoconazole (NIZORAL) 2 % cream Apply 1 application topically  daily. 15 g 0  . Lactobacillus (PROBIOTIC ACIDOPHILUS) CAPS Take 1 capsule by mouth daily at 6 (six) AM.    . mometasone (ELOCON) 0.1 % cream APPLY TO AFFECTED AREAS ONCE DAILY. 45 g 3  . Multiple Vitamin (MULTIVITAMIN) tablet Take 1 tablet by mouth daily.    . nitroGLYCERIN (NITROSTAT) 0.4 MG SL tablet PLACE 1 TAB UNDER TONGUE EVERY 5 MIN IF NEEDED FOR CHEST PAIN. MAY USE 3 TIMES.NO RELIEF CALL 911. 25 tablet 2  . polyethylene glycol powder (MIRALAX) 17 GM/SCOOP powder Start by taking 1/2 capful mixed in 4 ounces of fluid daily. You can increase to 1 capful daily if needed to maintain easy BMs. 527 g 5  . Polyvinyl Alcohol-Povidone (REFRESH OP) Place 1 drop into both ears 2 (two) times daily as needed (dryness).     . sitaGLIPtin (JANUVIA) 100 MG tablet TAKE ONE TABLET BY MOUTH ONCE DAILY. 30 tablet 0  . tamsulosin (FLOMAX) 0.4 MG CAPS capsule TAKE (1) CAPSULE BY MOUTH ONCE DAILY. 90 capsule 0  . torsemide (DEMADEX) 20 MG tablet Take 20 mg by mouth daily.    . Turmeric 500 MG CAPS Take 500 mg by mouth daily.    . VELTASSA 8.4 g packet Take 1 packet by mouth daily.     No current facility-administered medications for this visit.    Allergies as of 11/19/2020 - Review Complete 11/19/2020  Allergen Reaction Noted  . Adhesive [tape]  05/11/2018  . Sulfa antibiotics Other (See Comments) 06/04/2015  . Latex Other (See Comments) 06/29/2012    ROS:  General: Negative for anorexia, weight loss, fever, chills, fatigue, weakness. ENT: Negative for hoarseness, difficulty swallowing , nasal congestion. CV: Negative for chest pain, angina, palpitations, dyspnea on exertion, peripheral edema.  Respiratory: Negative for dyspnea at rest, dyspnea on exertion, cough, sputum, wheezing.  GI: See history of present illness. GU:  Negative for dysuria, hematuria, urinary incontinence, urinary frequency, nocturnal urination.  Endo: Negative for unusual weight change.    Physical Examination:   BP 123/72    Pulse 70   Temp (!) 97.1 F (36.2 C)   Ht 6' (1.829 m)   Wt 209 lb 3.2 oz (94.9 kg)   BMI 28.37 kg/m   General: Well-nourished, well-developed in no acute distress.  Eyes: No icterus. Mouth: masked Lungs: Clear to auscultation bilaterally.  Heart: Regular rate and rhythm, no murmurs rubs or gallops.  Abdomen: Bowel sounds are normal, nontender, nondistended, no hepatosplenomegaly or masses, no abdominal bruits or hernia , no rebound or guarding.  Rectus diastasis Extremities: No lower extremity edema. No clubbing or deformities. Neuro: Alert and oriented x 4   Skin: Warm and dry, no jaundice.   Psych: Alert and cooperative, normal mood and affect.  Labs:  Lab Results  Component Value Date   HGBA1C 8.2 (H) 10/22/2020   Lab Results  Component Value Date   CREATININE 1.41 (H) 10/22/2020   BUN 40 (H) 10/22/2020  NA 140 10/22/2020   K 5.0 10/22/2020   CL 100 10/22/2020   CO2 22 10/22/2020   Lab Results  Component Value Date   ALT 34 06/25/2020   AST 27 06/25/2020   ALKPHOS 130 (H) 06/25/2020   BILITOT 0.9 06/25/2020   Lab Results  Component Value Date   IRON 71 06/25/2020   TIBC 332 06/25/2020   FERRITIN 330 06/25/2020   Lab Results  Component Value Date   WBC 6.2 06/25/2020   HGB 12.3 (L) 06/25/2020   HCT 39.1 06/25/2020   MCV 97.0 06/25/2020   PLT 171 06/25/2020   Lab Results  Component Value Date   VITAMINB12 1,238 (H) 06/25/2020   Lab Results  Component Value Date   FOLATE 15.6 06/25/2020    Imaging Studies: CT Chest Wo Contrast  Result Date: 11/19/2020 CLINICAL DATA:  Pulmonary nodule, follow-up examination EXAM: CT CHEST WITHOUT CONTRAST TECHNIQUE: Multidetector CT imaging of the chest was performed following the standard protocol without IV contrast. COMPARISON:  11/26/2019 FINDINGS: Cardiovascular: Extensive multi-vessel coronary artery calcification is again seen. Mild global cardiomegaly appears stable. Previously noted pericardial effusion  has resolved. Central pulmonary arterial enlargement is again seen in keeping with changes of pulmonary artery hypertension. Moderate atherosclerotic calcification is seen within the thoracic aorta. No aortic aneurysm. Mediastinum/Nodes: The visualized thyroid is unremarkable. No pathologic thoracic adenopathy. The esophagus is unremarkable. Lungs/Pleura: Bilateral calcified pleural plaques are again identified in keeping with chronic changes related to remote is best disc exposure. Previously noted bibasilar pulmonary consolidation has resolved. Minimal residual bibasilar subpleural ground-glass infiltrate persists, possibly representing post inflammatory scarring subacute inflammatory change, or smoking related lung disease. 4 mm right middle lobe pulmonary nodule, axial image # 93/4, is stable and is safely considered benign. No new focal pulmonary nodules. No pneumothorax or pleural effusion. No central obstructing lesion. Upper Abdomen: 16 mm gastrohepatic ligament demonstrating central calcification likely the sequela of remote granulomatous disease is unchanged. No acute abnormality within the visualized upper abdomen. Musculoskeletal: No acute bone abnormality. No lytic or blastic bone lesion. IMPRESSION: Stable right middle lobe pulmonary nodule, safely considered benign given its stability over time. Bilateral calcified pleural plaques in keeping with remote asbestos exposure. Bibasilar subpleural sparse ground-glass pulmonary infiltrates, most in keeping with mild parenchymal scarring, subacute inflammatory change, or smoking related lung disease. Extensive multi-vessel coronary artery calcification. Central pulmonary arterial enlargement in keeping with changes of pulmonary artery hypertension. Aortic Atherosclerosis (ICD10-I70.0). Electronically Signed   By: Fidela Salisbury MD   On: 11/19/2020 04:08   Assessment/plan:  Pleasant 87 year old male with multiple comorbidities as outlined above,  presenting for follow-up of constipation, anemia.  Also with history of H. pylori status posttreatment, abnormal duodenum seen on prior CT.  Chronic anemia in the setting of chronic renal disease.  Last iron infusions back in March 2021. Hemoglobin in the 12 range, back in December. No overt GI bleeding. Continues to follow with hematology, has an office visit in June with labs planned at that time.  Abnormal duodenum seen on CT in May 2021.  Wall thickening about the second, third, fourth portion of duodenum with adjacent fat stranding. Diagnosed with H. pylori on biopsy in 2019 but had not been treated.  Prescription sent in June 2021 after his last office visit.  Patient completed H. pylori therapy. There have been plans to consider EGD after last office visit in July but patient left without being seen.   At time of follow-up in September he was clinically  much improved, Dr. Gala Romney recommended holding off on EGD but confirming H. pylori eradication.  Patient subsequently had a negative H. pylori stool antigen.  No further work-up needed at this time.  Constipation: Developed diarrhea with Linzess 72 mcg daily.   After last office visit we placed him on MiraLAX which seemed to be a little too effective.  Currently on Equate fiber supplement daily and doing very well.   Plan: 1. OV 1 year. 2. F/U labs from hematology in 12/2020. 3. Continue fiber supplement for constipation.

## 2020-11-19 NOTE — Progress Notes (Signed)
Cc'ed to pcp °

## 2020-11-19 NOTE — Patient Instructions (Signed)
1. I will follow up on labs planned in June 2022 and let you know if we have any additional recommendations from a GI standpoint.  2. Continue fiber supplement daily.  3. We will see you in one year. If you have any questions or concerns, please let me know.

## 2020-11-19 NOTE — Telephone Encounter (Signed)
Nurses Patient sent [place] glucose readings Continue long-acting insulin at 25 units  I would recommend a change in his sliding scale to be done at his mealtimes as follows If less than 90 no insulin If between 90 and 130 I recommend 2 units If 131-1 60 then I recommend 4 units If between 161--220 units then I recommend 6 units If above 220 I recommend 8 units  If he starts having a lot of low sugars he will need to let us know otherwise send [place] more readings in several weeks please see other phone message I recommend for the patient to do a follow-up in May no later than June thank you

## 2020-11-20 NOTE — Telephone Encounter (Signed)
Left message to return call and also sent my chart message 

## 2020-11-20 NOTE — Telephone Encounter (Signed)
Pt returned call and verbalized understanding. My chart message sent to patient.

## 2020-11-22 ENCOUNTER — Other Ambulatory Visit: Payer: Self-pay | Admitting: Family Medicine

## 2020-11-22 DIAGNOSIS — R9389 Abnormal findings on diagnostic imaging of other specified body structures: Secondary | ICD-10-CM

## 2020-11-22 DIAGNOSIS — R0600 Dyspnea, unspecified: Secondary | ICD-10-CM

## 2020-11-22 DIAGNOSIS — R0609 Other forms of dyspnea: Secondary | ICD-10-CM

## 2020-11-25 DIAGNOSIS — N1832 Chronic kidney disease, stage 3b: Secondary | ICD-10-CM | POA: Diagnosis not present

## 2020-11-26 ENCOUNTER — Telehealth: Payer: Self-pay | Admitting: Family Medicine

## 2020-11-26 NOTE — Telephone Encounter (Signed)
Last Labs 10/22/20: Met 7, HgbA1c, Microalbumin urine                   08/27/20: Lipid, Met 7, HgbA1c

## 2020-11-26 NOTE — Telephone Encounter (Signed)
Patient is wanting to know if he need labs done for his appointment on 5/23

## 2020-11-27 NOTE — Telephone Encounter (Signed)
Discussed with pt. Pt verbalized understanding.  °

## 2020-11-27 NOTE — Telephone Encounter (Signed)
I would not recommend additional blood work just yet.  It has been too soon since his last A1c and kidney function.  He should bring his readings with him so we can go over how his glucose readings are doing currently

## 2020-12-02 ENCOUNTER — Other Ambulatory Visit: Payer: Self-pay | Admitting: Family Medicine

## 2020-12-03 DIAGNOSIS — N1832 Chronic kidney disease, stage 3b: Secondary | ICD-10-CM | POA: Diagnosis not present

## 2020-12-03 DIAGNOSIS — E1122 Type 2 diabetes mellitus with diabetic chronic kidney disease: Secondary | ICD-10-CM | POA: Diagnosis not present

## 2020-12-03 DIAGNOSIS — I129 Hypertensive chronic kidney disease with stage 1 through stage 4 chronic kidney disease, or unspecified chronic kidney disease: Secondary | ICD-10-CM | POA: Diagnosis not present

## 2020-12-03 DIAGNOSIS — E875 Hyperkalemia: Secondary | ICD-10-CM | POA: Diagnosis not present

## 2020-12-05 ENCOUNTER — Encounter: Payer: Self-pay | Admitting: Family Medicine

## 2020-12-10 ENCOUNTER — Other Ambulatory Visit: Payer: Self-pay | Admitting: Family Medicine

## 2020-12-16 ENCOUNTER — Encounter: Payer: Self-pay | Admitting: Family Medicine

## 2020-12-16 ENCOUNTER — Other Ambulatory Visit: Payer: Self-pay

## 2020-12-16 ENCOUNTER — Ambulatory Visit (INDEPENDENT_AMBULATORY_CARE_PROVIDER_SITE_OTHER): Payer: Medicare Other | Admitting: Family Medicine

## 2020-12-16 VITALS — BP 130/74 | HR 78 | Temp 97.0°F | Wt 210.4 lb

## 2020-12-16 DIAGNOSIS — E1122 Type 2 diabetes mellitus with diabetic chronic kidney disease: Secondary | ICD-10-CM

## 2020-12-16 DIAGNOSIS — I2510 Atherosclerotic heart disease of native coronary artery without angina pectoris: Secondary | ICD-10-CM | POA: Diagnosis not present

## 2020-12-16 DIAGNOSIS — E785 Hyperlipidemia, unspecified: Secondary | ICD-10-CM

## 2020-12-16 DIAGNOSIS — N1831 Chronic kidney disease, stage 3a: Secondary | ICD-10-CM

## 2020-12-16 DIAGNOSIS — Z794 Long term (current) use of insulin: Secondary | ICD-10-CM | POA: Diagnosis not present

## 2020-12-16 DIAGNOSIS — I10 Essential (primary) hypertension: Secondary | ICD-10-CM | POA: Diagnosis not present

## 2020-12-16 DIAGNOSIS — E1169 Type 2 diabetes mellitus with other specified complication: Secondary | ICD-10-CM | POA: Diagnosis not present

## 2020-12-16 MED ORDER — INSULIN LISPRO (1 UNIT DIAL) 100 UNIT/ML (KWIKPEN): PEN_INJECTOR | SUBCUTANEOUS | 5 refills | Status: DC

## 2020-12-16 NOTE — Progress Notes (Signed)
Subjective:    Patient ID: Jerry Burns, male    DOB: 08-12-1933, 87 y.o.   MRN: [id number]  Diabetes He presents for his follow-up diabetic visit. He has type 2 diabetes mellitus. There are no hypoglycemic associated symptoms. There are no diabetic associated symptoms. There are no hypoglycemic complications. There are no diabetic complications. He does not see a podiatrist.Eye exam is current.  Pt has blood sugar readings Dr.People (nephrology)changed from 3 months to once a year. Hematolologist  next month. 2 weeks from today getting booster. Good BM. Good blood pressure. Bottom of soles tender. Still very weak compared to where he wants to be; decreased stamina.   He brings in his blood pressure readings in addition to this also his glucose readings plus sliding scale results.  Very detailed which was helpful  Review of Systems     Objective:   Physical Exam Vitals reviewed.  Constitutional:      General: He is not in acute distress. HENT:     Head: Normocephalic and atraumatic.  Eyes:     General:        Right eye: No discharge.        Left eye: No discharge.  Neck:     Trachea: No tracheal deviation.  Cardiovascular:     Rate and Rhythm: Normal rate and regular rhythm.     Heart sounds: Normal heart sounds. No murmur heard.   Pulmonary:     Effort: Pulmonary effort is normal. No respiratory distress.     Breath sounds: Normal breath sounds.  Lymphadenopathy:     Cervical: No cervical adenopathy.  Skin:    General: Skin is warm and dry.  Neurological:     Mental Status: He is alert.     Coordination: Coordination normal.  Psychiatric:        Behavior: Behavior normal.    Results for orders placed or performed in visit on 70/34/03  Basic metabolic panel  Result Value Ref Range   Glucose 213 (H) 65 - 99 mg/dL   BUN 40 (H) 8 - 27 mg/dL   Creatinine, Ser 1.41 (H) 0.76 - 1.27 mg/dL   eGFR 49 (L) >59 mL/min/1.73   BUN/Creatinine Ratio 28 (H) 10 - 24   Sodium  140 134 - 144 mmol/L   Potassium 5.0 3.5 - 5.2 mmol/L   Chloride 100 96 - 106 mmol/L   CO2 22 20 - 29 mmol/L   Calcium 9.8 8.6 - 10.2 mg/dL  Hemoglobin A1c  Result Value Ref Range   Hgb A1c MFr Bld 8.2 (H) 4.8 - 5.6 %   Est. average glucose Bld gHb Est-mCnc 189 mg/dL  Microalbumin / creatinine urine ratio  Result Value Ref Range   Creatinine, Urine 32.3 Not Estab. mg/dL   Microalbumin, Urine 24.8 Not Estab. ug/mL   Microalb/Creat Ratio 77 (H) 0 - 29 mg/g creat    No evidence of CHF on today's exam      Assessment & Plan:  1. Hyperlipidemia associated with type 2 diabetes mellitus ([place]) Cholesterol good control, continue medication watch diet - Hemoglobin T2Y - Basic Metabolic Panel (BMET)  2. Type 2 diabetes mellitus with stage 3a chronic kidney disease, with long-term current use of insulin ([place]) I reviewed over his readings today we adjusted the sliding scale to try to get better control if his numbers are below 85 or more than 350 he is to let us know in addition as he is a send [place] more readings in  a few weeks time  Patient will follow-up in several months time sooner if any problems - Hemoglobin V3X - Basic Metabolic Panel (BMET)  3. Essential hypertension Blood pressure good control continue current measures. - Hemoglobin L2Z - Basic Metabolic Panel (BMET)  Patient does relate some fatigue and tiredness related to his ongoing diabetes and age denies any chest tightness pressure pain shortness of breath  Patient to do follow-up lab work later in June we will follow-up in about 3 to 4 months

## 2020-12-30 ENCOUNTER — Ambulatory Visit: Payer: Self-pay

## 2020-12-30 ENCOUNTER — Other Ambulatory Visit (HOSPITAL_BASED_OUTPATIENT_CLINIC_OR_DEPARTMENT_OTHER): Payer: Self-pay

## 2020-12-30 ENCOUNTER — Ambulatory Visit: Payer: Medicare Other

## 2020-12-30 ENCOUNTER — Encounter (HOSPITAL_COMMUNITY): Payer: Self-pay | Admitting: Hematology

## 2021-01-02 ENCOUNTER — Other Ambulatory Visit: Payer: Self-pay

## 2021-01-02 ENCOUNTER — Inpatient Hospital Stay (HOSPITAL_COMMUNITY): Payer: Medicare Other | Attending: Hematology

## 2021-01-02 DIAGNOSIS — D649 Anemia, unspecified: Secondary | ICD-10-CM | POA: Diagnosis not present

## 2021-01-02 DIAGNOSIS — Z7901 Long term (current) use of anticoagulants: Secondary | ICD-10-CM | POA: Insufficient documentation

## 2021-01-02 DIAGNOSIS — Z87891 Personal history of nicotine dependence: Secondary | ICD-10-CM | POA: Insufficient documentation

## 2021-01-02 DIAGNOSIS — Z800 Family history of malignant neoplasm of digestive organs: Secondary | ICD-10-CM | POA: Insufficient documentation

## 2021-01-02 DIAGNOSIS — I82441 Acute embolism and thrombosis of right tibial vein: Secondary | ICD-10-CM | POA: Insufficient documentation

## 2021-01-02 DIAGNOSIS — Z808 Family history of malignant neoplasm of other organs or systems: Secondary | ICD-10-CM | POA: Diagnosis not present

## 2021-01-02 LAB — IRON AND TIBC
Iron: 85 ug/dL (ref 45–182)
Saturation Ratios: 26 % (ref 17.9–39.5)
TIBC: 322 ug/dL (ref 250–450)
UIBC: 237 ug/dL

## 2021-01-02 LAB — CBC WITH DIFFERENTIAL/PLATELET
Abs Immature Granulocytes: 0.01 10*3/uL (ref 0.00–0.07)
Basophils Absolute: 0.1 10*3/uL (ref 0.0–0.1)
Basophils Relative: 1 %
Eosinophils Absolute: 0.3 10*3/uL (ref 0.0–0.5)
Eosinophils Relative: 5 %
HCT: 37.1 % — ABNORMAL LOW (ref 39.0–52.0)
Hemoglobin: 12 g/dL — ABNORMAL LOW (ref 13.0–17.0)
Immature Granulocytes: 0 %
Lymphocytes Relative: 15 %
Lymphs Abs: 0.8 10*3/uL (ref 0.7–4.0)
MCH: 31.7 pg (ref 26.0–34.0)
MCHC: 32.3 g/dL (ref 30.0–36.0)
MCV: 98.1 fL (ref 80.0–100.0)
Monocytes Absolute: 0.9 10*3/uL (ref 0.1–1.0)
Monocytes Relative: 17 %
Neutro Abs: 3.2 10*3/uL (ref 1.7–7.7)
Neutrophils Relative %: 62 %
Platelets: 130 10*3/uL — ABNORMAL LOW (ref 150–400)
RBC: 3.78 MIL/uL — ABNORMAL LOW (ref 4.22–5.81)
RDW: 14.4 % (ref 11.5–15.5)
WBC: 5.1 10*3/uL (ref 4.0–10.5)
nRBC: 0 % (ref 0.0–0.2)

## 2021-01-02 LAB — FERRITIN: Ferritin: 266 ng/mL (ref 24–336)

## 2021-01-03 ENCOUNTER — Ambulatory Visit (HOSPITAL_COMMUNITY)
Admission: RE | Admit: 2021-01-03 | Discharge: 2021-01-03 | Disposition: A | Discharge status: Home / Self Care | Payer: Medicare Other | Source: Ambulatory Visit | Attending: Family Medicine | Admitting: Family Medicine

## 2021-01-03 DIAGNOSIS — R0600 Dyspnea, unspecified: Secondary | ICD-10-CM | POA: Diagnosis not present

## 2021-01-03 DIAGNOSIS — R9389 Abnormal findings on diagnostic imaging of other specified body structures: Secondary | ICD-10-CM | POA: Insufficient documentation

## 2021-01-03 LAB — ECHOCARDIOGRAM COMPLETE
Area-P 1/2: 3.72 cm2
S' Lateral: 3.2 cm

## 2021-01-03 NOTE — Progress Notes (Signed)
*  PRELIMINARY RESULTS* Echocardiogram 2D Echocardiogram has been performed.  Samuel [place] 01/03/2021, 9:09 AM

## 2021-01-08 NOTE — Progress Notes (Signed)
[place], [place] [address]   CLINIC:  Medical Oncology/Hematology  PCP:  Jerry Drown, MD [address] [place] / [place] [address]  [phone]  REASON FOR VISIT:  Follow-up for IDA  PRIOR THERAPY: none  CURRENT THERAPY:  Intermittent feraheme  INTERVAL HISTORY:  [name], a 87 y.o. male, returns for routine follow-up for his IDA. Jerry Burns was last seen on 07/05/20.  Today he reports feeling is okay. He reports that this energy levels ara 60% from baseline, and he denies any black stools. He is not currently taking any blood thinners or iron supplements. He has never had iron supplements.   REVIEW OF SYSTEMS:  Review of Systems  Constitutional:  Positive for fatigue (60%). Negative for appetite change.  Gastrointestinal:  Positive for constipation.  Psychiatric/Behavioral:  Positive for depression.   All other systems reviewed and are negative.  PAST MEDICAL/SURGICAL HISTORY:  Past Medical History:  Diagnosis Date   Anginal pain ([place])    Aortic atherosclerosis ([place]) 10/06/2020   Seen on CAT scan 2021   Arthritis    Bilateral knee   CHF (congestive heart failure) ([place])    Coronary artery disease    Diabetes mellitus    insulin dependent    DVT, lower extremity, distal, acute ([place]) 09/09/2018   Hyperlipidemia    Hypertension    Progressive angina ([place]) 03/2015   a. s/p DES to proximal LAD and balloon angioplasty to distal LAD in 2016 b. patent stent by repeat cath in 01/2017 with mild residual CAD)   Pulmonary nodule 10/06/2020   Seen on CAT scan 2020 1 repeat CAT scan in 2022   Stroke [facility])    Past Surgical History:  Procedure Laterality Date   APPENDECTOMY     CARDIAC CATHETERIZATION N/A 04/12/2015   Procedure: Left Heart Cath and Coronary Angiography;  Surgeon: Jerry Mocha, MD; pLAD 75>0% w/ 3.0x12 mm Resolute DES, dLAD 80>0% w/ Angiosculpt scoring balloon, CFX luminal irreg, RCA mild dz, EF 55-65%     COLONOSCOPY  2011   RMR: 1. Normal rectum 2. Left sided diverticula , 2 ascending colon polyps, status post snare polypectomy. Remainder of colonic mucosa appeared unremarkable.    COLONOSCOPY N/A 05/13/2016   Procedure: COLONOSCOPY;  Surgeon: Jerry Dolin, MD;  Location: [facility];  Service: Endoscopy;  Laterality: N/A;  7:30 AM   COLONOSCOPY N/A 05/18/2018   Rourk: Diverticulosis, single tubular adenoma removed.  No plans for future screening or surveillance colonoscopy due to age.   CORONARY ANGIOGRAPHY N/A 02/04/2017   Procedure: CORONARY ANGIOGRAPHY (CATH LAB);  Surgeon: Jerry Sine, MD;  Location: [facility] CV LAB;  Service: Cardiovascular;  Laterality: N/A;   CORONARY ANGIOPLASTY WITH STENT PLACEMENT  08/22/2014   PTCA/DES X mLAD with 2.5 x 16 mm Promus Premier DES by Dr Jerry Burns   ESOPHAGOGASTRODUODENOSCOPY N/A 05/18/2018   [place]ourk: Ulcerative reflux esophagitis, hiatal hernia, erosive gastropathy with H. pylori on biopsy, also in the setting of NSAID use.   FOOT SURGERY Right    LEFT HEART CATHETERIZATION WITH CORONARY ANGIOGRAM N/A 08/22/2014   Procedure: LEFT HEART CATHETERIZATION WITH CORONARY ANGIOGRAM;  Surgeon: Jerry Blanks, MD;  Location: [facility] CATH LAB;  Service: Cardiovascular;  Laterality: N/A;   PERCUTANEOUS CORONARY STENT INTERVENTION (PCI-S)  08/22/2014   Procedure: PERCUTANEOUS CORONARY STENT INTERVENTION (PCI-S);  Surgeon: Jerry Blanks, MD;    SHOULDER SURGERY     TONSILLECTOMY  SOCIAL HISTORY:  Social History   Socioeconomic History   Marital status: Married    Spouse name: Not on file   Number of children: Not on file   Years of education: 14   Highest education level: Not on file  Occupational History   Occupation: retired  Tobacco Use   Smoking status: Former    Packs/day: 1.00    Years: 20.00    Pack years: 20.00    Types: Cigarettes, Cigars    Start date: 08/10/1955    Quit date: 07/28/2007    Years since quitting:  13.4   Smokeless tobacco: Never  Vaping Use   Vaping Use: Never used  Substance and Sexual Activity   Alcohol use: Not Currently    Alcohol/week: 0.0 standard drinks    Comment: Daily about 1 glass of wine and gin   Drug use: No   Sexual activity: Not Currently  Other Topics Concern   Not on file  Social History Narrative   Not on file   Social Determinants of Health   Financial Resource Strain: Low Risk    Difficulty of Paying Living Expenses: Not very hard  Food Insecurity: No Food Insecurity   Worried About Charity fundraiser in the Last Year: Never true   [place] in the Last Year: Never true  Transportation Needs: No Transportation Needs   Lack of Transportation (Medical): No   Lack of Transportation (Non-Medical): No  Physical Activity: Inactive   Days of Exercise per Week: 0 days   Minutes of Exercise per Session: 0 min  Stress: No Stress Concern Present   Feeling of Stress : Not at all  Social Connections: Moderately Integrated   Frequency of Communication with Friends and Family: More than three times a week   Frequency of Social Gatherings with Friends and Family: More than three times a week   Attends Religious Services: 1 to 4 times per year   Active Member of [employer] or Organizations: No   Attends Music therapist: Never   Marital Status: Married  Human resources officer Violence: Not At Risk   Fear of Current or Ex-Partner: No   Emotionally Abused: No   Physically Abused: No   Sexually Abused: No    FAMILY HISTORY:  Family History  Problem Relation Age of Onset   Diabetes Other    Diabetes Brother    Colon cancer Maternal Uncle    Skin cancer Mother    Diabetes Father    Hypertension Sister    Skin cancer Sister     CURRENT MEDICATIONS:  Current Outpatient Medications  Medication Sig Dispense Refill   Ascorbic Acid (VITAMIN C) 1000 MG tablet Take 1,000 mg by mouth daily.      aspirin EC 81 MG tablet Take 81 mg by mouth daily.      atorvastatin (LIPITOR) 80 MG tablet TAKE (1) TABLET BY MOUTH ONCE DAILY. 90 tablet 0   B-D UF III MINI PEN NEEDLES 31G X 5 MM MISC SMARTSIG:1 Each SUB-Q Twice Daily     carvedilol (COREG) 12.5 MG tablet Take 1 tablet (12.5 mg total) by mouth 2 (two) times daily. 180 tablet 3   COMBIGAN 0.2-0.5 % ophthalmic solution Place 1 drop into the left eye 2 (two) times daily.   2   Cyanocobalamin (B-12) 2500 MCG TABS Take 1 tablet by mouth 2 (two) times a week. Three times a week on Monday Wednesday and Friday     Emollient (CERAVE) CREA  Apply 1 application topically daily at 6 (six) AM.     empagliflozin (JARDIANCE) 10 MG TABS tablet Take 10 mg by mouth daily.     erythromycin ophthalmic ointment Place 1 application into the left eye 3 (three) times daily as needed (infection).      Flaxseed, Linseed, (FLAXSEED OIL PO) Take 1 capsule by mouth daily.      glucose blood (CONTOUR NEXT TEST) test strip USE 1 STRIP  TO CHECK GLUCOSE 3 TIMES DAILY 300 each 1   insulin glargine (LANTUS SOLOSTAR) 100 UNIT/ML Solostar Pen INJECT 24-25 UNITS SUBCUTANEOUSLY ONCE DAILY AT  10  PM 15 mL 0   insulin lispro (HUMALOG KWIKPEN) 100 UNIT/ML KwikPen Inject twice daily per following: Breakfast and supper <85 none; 85-110 3 units; 111-160 5 units; 161-220 7 units; 221 or higher 9 units 15 mL 5   ketoconazole (NIZORAL) 2 % cream Apply 1 application topically daily. 15 g 0   Lactobacillus (PROBIOTIC ACIDOPHILUS) CAPS Take 1 capsule by mouth daily at 6 (six) AM.     mometasone (ELOCON) 0.1 % cream APPLY TO AFFECTED AREAS ONCE DAILY. 45 g 3   Multiple Vitamin (MULTIVITAMIN) tablet Take 1 tablet by mouth daily.     nitroGLYCERIN (NITROSTAT) 0.4 MG SL tablet PLACE 1 TAB UNDER TONGUE EVERY 5 MIN IF NEEDED FOR CHEST PAIN. MAY USE 3 TIMES.NO RELIEF CALL 911. 25 tablet 2   polyethylene glycol powder (MIRALAX) 17 GM/SCOOP powder Start by taking 1/2 capful mixed in 4 ounces of fluid daily. You can increase to 1 capful daily if needed to  maintain easy BMs. 527 g 5   Polyvinyl Alcohol-Povidone (REFRESH OP) Place 1 drop into both ears 2 (two) times daily as needed (dryness).      sitaGLIPtin (JANUVIA) 100 MG tablet TAKE ONE TABLET BY MOUTH ONCE DAILY. 30 tablet 0   tamsulosin (FLOMAX) 0.4 MG CAPS capsule TAKE (1) CAPSULE BY MOUTH ONCE DAILY. 90 capsule 0   torsemide (DEMADEX) 20 MG tablet Take 20 mg by mouth daily.     Turmeric 500 MG CAPS Take 500 mg by mouth daily.     VELTASSA 8.4 g packet Take 1 packet by mouth daily.     No current facility-administered medications for this visit.    ALLERGIES:  Allergies  Allergen Reactions   Adhesive [Tape]     bandaids cause blisters after leaving on for 24 hours    Sulfa Antibiotics Other (See Comments)    "was terrible" "eye went crazy"   Latex Other (See Comments)    blisters    PHYSICAL EXAM:  Performance status (ECOG): 1 - Symptomatic but completely ambulatory  There were no vitals filed for this visit. Wt Readings from Last 3 Encounters:  12/16/20 210 lb 6.4 oz (95.4 kg)  11/19/20 209 lb 3.2 oz (94.9 kg)  10/29/20 210 lb (95.3 kg)   Physical Exam Vitals reviewed.  Constitutional:      Appearance: Normal appearance.  Cardiovascular:     Rate and Rhythm: Normal rate and regular rhythm.     Pulses: Normal pulses.     Heart sounds: Normal heart sounds.  Pulmonary:     Effort: Pulmonary effort is normal.     Breath sounds: Normal breath sounds.  Neurological:     General: No focal deficit present.     Mental Status: He is alert and oriented to person, place, and time.  Psychiatric:        Mood and Affect: Mood normal.  Behavior: Behavior normal.    LABORATORY DATA:  I have reviewed the labs as listed.  CBC Latest Ref Rng & Units 01/02/2021 06/25/2020 02/28/2020  WBC 4.0 - 10.5 K/uL 5.1 6.2 6.8  Hemoglobin 13.0 - 17.0 g/dL 12.0(L) 12.3(L) 11.2(L)  Hematocrit 39.0 - 52.0 % 37.1(L) 39.1 35.8(L)  Platelets 150 - 400 K/uL 130(L) 171 180   CMP Latest Ref  Rng & Units 10/22/2020 08/27/2020 06/25/2020  Glucose 65 - 99 mg/dL 213(H) 91 141(H)  BUN 8 - 27 mg/dL 40(H) 51(H) 44(H)  Creatinine 0.76 - 1.27 mg/dL 1.41(H) 1.69(H) 1.42(H)  Sodium 134 - 144 mmol/L 140 140 140  Potassium 3.5 - 5.2 mmol/L 5.0 5.6(H) 5.5(H)  Chloride 96 - 106 mmol/L 100 103 108  CO2 20 - 29 mmol/L 22 24 24   Calcium 8.6 - 10.2 mg/dL 9.8 9.8 9.7  Total Protein 6.5 - 8.1 g/dL - - 7.7  Total Bilirubin 0.3 - 1.2 mg/dL - - 0.9  Alkaline Phos 38 - 126 U/L - - 130(H)  AST 15 - 41 U/L - - 27  ALT 0 - 44 U/L - - 34      Component Value Date/Time   RBC 3.78 (L) 01/02/2021 0837   MCV 98.1 01/02/2021 0837   MCV 94 12/11/2019 1037   MCH 31.7 01/02/2021 0837   MCHC 32.3 01/02/2021 0837   RDW 14.4 01/02/2021 0837   RDW 12.4 12/11/2019 1037   LYMPHSABS 0.8 01/02/2021 0837   LYMPHSABS 0.8 12/11/2019 1037   MONOABS 0.9 01/02/2021 0837   EOSABS 0.3 01/02/2021 0837   EOSABS 0.3 12/11/2019 1037   BASOSABS 0.1 01/02/2021 0837   BASOSABS 0.1 12/11/2019 1037    DIAGNOSTIC IMAGING:  I have independently reviewed the scans and discussed with the patient. ECHOCARDIOGRAM COMPLETE  Result Date: 01/03/2021    ECHOCARDIOGRAM REPORT   Patient Name:   Jerry Burns Date of Exam: 01/03/2021 Medical Rec #:  [id number]        Height:       72.0 in Accession #:    [id number]       Weight:       210.4 lb Date of Birth:  05-Nov-1933        BSA:          2.177 m Patient Age:    80 years         BP:           127/75 mmHg Patient Gender: M                HR:           70 bpm. Exam Location:  [facility] Procedure: 2D Echo, Cardiac Doppler and Color Doppler Indications:    R06.00 (ICD-10-CM) - DOE (dyspnea on exertion)                 R93.89 (ICD-10-CM) - Abnormal CT scan  History:        Patient has prior history of Echocardiogram examinations, most                 recent 12/27/2019. CHF, Stroke; Risk Factors:Hypertension,                 Diabetes and Dyslipidemia. ASCVD (arteriosclerotic                  cardiovascular disease).  Sonographer:    Alvino Chapel RCS Referring Phys: [phone] SC[place] A [place]  1. Left ventricular ejection fraction, by  estimation, is 55 to 60%. The left ventricle has normal function. The left ventricle has no regional wall motion abnormalities. Left ventricular diastolic parameters are indeterminate in the setting of apparent atrial fibrillation.  2. Right ventricular systolic function is normal. The right ventricular size is normal. Tricuspid regurgitation signal is inadequate for assessing PA pressure.  3. Left atrial size was moderately dilated.  4. The mitral valve is grossly normal. Mild mitral valve regurgitation.  5. The aortic valve is tricuspid. Aortic valve regurgitation is not visualized.  6. The inferior vena cava is dilated in size with >50% respiratory variability, suggesting right atrial pressure of 8 mmHg.  7. Pericardial effusion has resolved. FINDINGS  Left Ventricle: Left ventricular ejection fraction, by estimation, is 55 to 60%. The left ventricle has normal function. The left ventricle has no regional wall motion abnormalities. The left ventricular internal cavity size was normal in size. There is  borderline left ventricular hypertrophy. Left ventricular diastolic parameters are indeterminate. Right Ventricle: The right ventricular size is normal. No increase in right ventricular wall thickness. Right ventricular systolic function is normal. Tricuspid regurgitation signal is inadequate for assessing PA pressure. Left Atrium: Left atrial size was moderately dilated. Right Atrium: Right atrial size was normal in size. Pericardium: There is no evidence of pericardial effusion. Presence of pericardial fat pad. Mitral Valve: The mitral valve is grossly normal. Mild mitral valve regurgitation. Tricuspid Valve: The tricuspid valve is grossly normal. Tricuspid valve regurgitation is trivial. Aortic Valve: The aortic valve is tricuspid. There is mild aortic valve  annular calcification. Aortic valve regurgitation is not visualized. Pulmonic Valve: The pulmonic valve was grossly normal. Pulmonic valve regurgitation is trivial. Aorta: The aortic root is normal in size and structure. Venous: The inferior vena cava is dilated in size with greater than 50% respiratory variability, suggesting right atrial pressure of 8 mmHg. IAS/Shunts: No atrial level shunt detected by color flow Doppler.  LEFT VENTRICLE PLAX 2D LVIDd:         4.50 cm  Diastology LVIDs:         3.20 cm  LV e' medial:    9.57 cm/s LV PW:         1.00 cm  LV E/e' medial:  10.4 LV IVS:        1.00 cm  LV e' lateral:   9.79 cm/s LVOT diam:     2.20 cm  LV E/e' lateral: 10.2 LV SV:         69 LV SV Index:   32 LVOT Area:     3.80 cm  RIGHT VENTRICLE RV S prime:     8.27 cm/s TAPSE (M-mode): 1.2 cm LEFT ATRIUM              Index       RIGHT ATRIUM           Index LA diam:        4.30 cm  1.98 cm/m  RA Area:     18.20 cm LA Vol (A2C):   101.0 ml 46.39 ml/m RA Volume:   41.40 ml  19.02 ml/m LA Vol (A4C):   95.2 ml  43.73 ml/m LA Biplane Vol: 106.0 ml 48.69 ml/m  AORTIC VALVE LVOT Vmax:   87.70 cm/s LVOT Vmean:  61.600 cm/s LVOT VTI:    0.181 m  AORTA Ao Root diam: 3.60 cm MITRAL VALVE MV Area (PHT): 3.72 cm    SHUNTS MV Decel Time: 204 msec    Systemic VTI:  0.18 m MV E velocity: 99.70 cm/s  Systemic Diam: 2.20 cm MV A velocity: 34.60 cm/s MV E/A ratio:  2.88 Rozann Lesches MD Electronically signed by Rozann Lesches MD Signature Date/Time: 01/03/2021/10:53:00 AM    Final      ASSESSMENT:  1.  Normocytic anemia: - Thought to be from GI blood loss. -He had a hemoglobin of 9.7 in September 2019 followed by a GI work-up. -He had an EGD on 05/18/2018 showed erosive reflux esophagitis, erosive gastropathy.  Biopsy was consistent with chronic active gastritis with H. pylori.  It was thought that he could have been bleeding from upper GI. -Colonoscopy on 05/18/2018 showed diverticulosis of the sigmoid colon,  descending colon and transverse colon.  Tubular adenoma from hepatic flexure was resected. - During his initial work up, stool for occult blood x3 was negative.  Direct Coombs test and LDH were normal.  E26 and folic acid were normal.  NO concerning ROS or PE findings. -last Feraheme on 10/06/2019 and 10/13/2019.   2.  Distal DVT: - He had an ultrasound on 09/03/2018 showed possible nonocclusive thrombus of the intermediate acuity within 1 of the posterior tibial veins in the right calf. - He is currently on Eliquis 5 mg twice daily and tolerating well.  That was started in February 2019. - A CT scan of the chest PE protocol could not be done as he is unable to lie flat at that time. - Eliquis discontinued after 6 months.   PLAN:  1.  Normocytic anemia: - He denies any bleeding per rectum or melena. - I reviewed his labs today which showed ferritin is 266, slightly decreased from before.  Hemoglobin is 12.0. - Recommend starting iron tablet every other day along with stool softener. - RTC 6 months for follow-up.   Orders placed this encounter:  No orders of the defined types were placed in this encounter.    Derek Jack, MD [place] [phone]   I, Thana Ates, am acting as a scribe for Dr. Derek Jack.  I, Derek Jack MD, have reviewed the above documentation for accuracy and completeness, and I agree with the above.

## 2021-01-09 ENCOUNTER — Other Ambulatory Visit: Payer: Self-pay

## 2021-01-09 ENCOUNTER — Inpatient Hospital Stay (HOSPITAL_BASED_OUTPATIENT_CLINIC_OR_DEPARTMENT_OTHER): Payer: Medicare Other | Admitting: Hematology

## 2021-01-09 VITALS — BP 124/63 | HR 72 | Temp 96.8°F | Resp 18 | Wt 211.2 lb

## 2021-01-09 DIAGNOSIS — Z7901 Long term (current) use of anticoagulants: Secondary | ICD-10-CM | POA: Diagnosis not present

## 2021-01-09 DIAGNOSIS — Z808 Family history of malignant neoplasm of other organs or systems: Secondary | ICD-10-CM | POA: Diagnosis not present

## 2021-01-09 DIAGNOSIS — I82441 Acute embolism and thrombosis of right tibial vein: Secondary | ICD-10-CM | POA: Diagnosis not present

## 2021-01-09 DIAGNOSIS — Z87891 Personal history of nicotine dependence: Secondary | ICD-10-CM | POA: Diagnosis not present

## 2021-01-09 DIAGNOSIS — D649 Anemia, unspecified: Secondary | ICD-10-CM

## 2021-01-09 DIAGNOSIS — Z800 Family history of malignant neoplasm of digestive organs: Secondary | ICD-10-CM | POA: Diagnosis not present

## 2021-01-09 NOTE — Patient Instructions (Addendum)
[address] at [facility] Discharge Instructions  You were seen today by Dr. Delton Coombes. He went over your recent results. Purchase iron tablets (325 mg) over the counter and take one every other day. Iron can cause constipation, so purchase a stool softener to be taken as needed for constipation. Dr. Delton Coombes will see you back in 6 months for labs and follow up.   Thank you for choosing [place] at [facility] to provide your oncology and hematology care.  To afford each patient quality time with our provider, please arrive at least 15 minutes before your scheduled appointment time.   If you have a lab appointment with the [facility] please come in thru the Main Entrance and check in at the main information desk  You need to re-schedule your appointment should you arrive 10 or more minutes late.  We strive to give you quality time with our providers, and arriving late affects you and other patients whose appointments are after yours.  Also, if you no show three or more times for appointments you may be dismissed from the clinic at the providers discretion.     Again, thank you for choosing [facility].  Our hope is that these requests will decrease the amount of time that you wait before being seen by our physicians.       _____________________________________________________________  Should you have questions after your visit to [facility], please contact our office at [phone] between the hours of 8:00 a.m. and 4:30 p.m.  Voicemails left after 4:00 p.m. will not be returned until the following business day.  For prescription refill requests, have your pharmacy contact our office and allow 72 hours.    Cancer Center Support Programs:   > Cancer Support Group  2nd Tuesday of the month 1pm-2pm, Journey Room

## 2021-01-10 ENCOUNTER — Other Ambulatory Visit: Payer: Self-pay | Admitting: Family Medicine

## 2021-01-22 ENCOUNTER — Other Ambulatory Visit: Payer: Self-pay | Admitting: Family Medicine

## 2021-01-24 ENCOUNTER — Other Ambulatory Visit: Payer: Self-pay | Admitting: Family Medicine

## 2021-01-28 DIAGNOSIS — I10 Essential (primary) hypertension: Secondary | ICD-10-CM | POA: Diagnosis not present

## 2021-01-28 DIAGNOSIS — E1122 Type 2 diabetes mellitus with diabetic chronic kidney disease: Secondary | ICD-10-CM | POA: Diagnosis not present

## 2021-01-28 DIAGNOSIS — E1169 Type 2 diabetes mellitus with other specified complication: Secondary | ICD-10-CM | POA: Diagnosis not present

## 2021-01-28 DIAGNOSIS — N1831 Chronic kidney disease, stage 3a: Secondary | ICD-10-CM | POA: Diagnosis not present

## 2021-01-28 DIAGNOSIS — E785 Hyperlipidemia, unspecified: Secondary | ICD-10-CM | POA: Diagnosis not present

## 2021-01-28 DIAGNOSIS — Z794 Long term (current) use of insulin: Secondary | ICD-10-CM | POA: Diagnosis not present

## 2021-01-29 LAB — HEMOGLOBIN A1C
Est. average glucose Bld gHb Est-mCnc: 171 mg/dL
Hgb A1c MFr Bld: 7.6 % — ABNORMAL HIGH (ref 4.8–5.6)

## 2021-01-29 LAB — BASIC METABOLIC PANEL
BUN/Creatinine Ratio: 23 (ref 10–24)
BUN: 40 mg/dL — ABNORMAL HIGH (ref 8–27)
CO2: 27 mmol/L (ref 20–29)
Calcium: 9.7 mg/dL (ref 8.6–10.2)
Chloride: 97 mmol/L (ref 96–106)
Creatinine, Ser: 1.72 mg/dL — ABNORMAL HIGH (ref 0.76–1.27)
Glucose: 107 mg/dL — ABNORMAL HIGH (ref 65–99)
Potassium: 4.6 mmol/L (ref 3.5–5.2)
Sodium: 139 mmol/L (ref 134–144)
eGFR: 38 mL/min/{1.73_m2} — ABNORMAL LOW (ref >59)

## 2021-02-06 ENCOUNTER — Other Ambulatory Visit: Payer: Self-pay | Admitting: Family Medicine

## 2021-02-24 ENCOUNTER — Ambulatory Visit (INDEPENDENT_AMBULATORY_CARE_PROVIDER_SITE_OTHER): Payer: Medicare Other | Admitting: Family Medicine

## 2021-02-24 ENCOUNTER — Other Ambulatory Visit: Payer: Self-pay

## 2021-02-24 VITALS — BP 128/77 | HR 70 | Temp 97.5°F | Ht 72.0 in | Wt 213.0 lb

## 2021-02-24 DIAGNOSIS — Z794 Long term (current) use of insulin: Secondary | ICD-10-CM | POA: Diagnosis not present

## 2021-02-24 DIAGNOSIS — E538 Deficiency of other specified B group vitamins: Secondary | ICD-10-CM

## 2021-02-24 DIAGNOSIS — R3912 Poor urinary stream: Secondary | ICD-10-CM | POA: Diagnosis not present

## 2021-02-24 DIAGNOSIS — E875 Hyperkalemia: Secondary | ICD-10-CM

## 2021-02-24 DIAGNOSIS — E1169 Type 2 diabetes mellitus with other specified complication: Secondary | ICD-10-CM

## 2021-02-24 DIAGNOSIS — I10 Essential (primary) hypertension: Secondary | ICD-10-CM

## 2021-02-24 DIAGNOSIS — N401 Enlarged prostate with lower urinary tract symptoms: Secondary | ICD-10-CM | POA: Diagnosis not present

## 2021-02-24 DIAGNOSIS — E785 Hyperlipidemia, unspecified: Secondary | ICD-10-CM | POA: Diagnosis not present

## 2021-02-24 DIAGNOSIS — E1140 Type 2 diabetes mellitus with diabetic neuropathy, unspecified: Secondary | ICD-10-CM | POA: Diagnosis not present

## 2021-02-24 DIAGNOSIS — N184 Chronic kidney disease, stage 4 (severe): Secondary | ICD-10-CM

## 2021-02-24 DIAGNOSIS — R600 Localized edema: Secondary | ICD-10-CM

## 2021-02-24 DIAGNOSIS — I2510 Atherosclerotic heart disease of native coronary artery without angina pectoris: Secondary | ICD-10-CM

## 2021-02-24 DIAGNOSIS — E1122 Type 2 diabetes mellitus with diabetic chronic kidney disease: Secondary | ICD-10-CM | POA: Diagnosis not present

## 2021-02-24 NOTE — Progress Notes (Signed)
Subjective:    Patient ID: Jerry Burns, male    DOB: 03/04/1934, 87 y.o.   MRN: [id number]  Diabetes  Follow up   Essential hypertension - Plan: Basic metabolic panel, PSA, Uric acid, Vitamin B12  Hyperlipidemia associated with type 2 diabetes mellitus ([place]) - Plan: Basic metabolic panel, PSA, Uric acid, Vitamin B12  Benign prostatic hyperplasia with weak urinary stream - Plan: Basic metabolic panel, PSA, Uric acid, Vitamin B12  HYPERKALEMIA - Plan: Basic metabolic panel, PSA, Uric acid, Vitamin B12  Pedal edema - Plan: Basic metabolic panel, PSA, Uric acid, Vitamin B12  CKD stage 4 due to type 2 diabetes mellitus ([place]) - Plan: Basic metabolic panel, PSA, Uric acid, Vitamin B12  Vitamin B12 deficiency - Plan: Basic metabolic panel, PSA, Uric acid, Vitamin B12  Type 2 diabetes mellitus with diabetic neuropathy, with long-term current use of insulin (H[facility]) - Plan: Basic metabolic panel, PSA, Uric acid, Vitamin B12  Review of Systems     Objective:   Physical Exam General-in no acute distress Eyes-no discharge Lungs-respiratory rate normal, CTA CV-no murmurs,RRR Extremities skin warm dry no edema Neuro grossly normal Behavior normal, alert        Assessment & Plan:  1. Essential hypertension Blood pressure good control continue current measures watch diet - Basic metabolic panel - PSA - Uric acid - Vitamin B12  2. Hyperlipidemia associated with type 2 diabetes mellitus ([place]) Patient tolerating statin currently continue current medication - Basic metabolic panel - PSA - Uric acid - Vitamin B12  3. Benign prostatic hyperplasia with weak urinary stream Bump up the dose of the Flomax use to each evening hopefully this will help if he has dizziness with standing he should let us know his blood pressure was checked sitting and standing no appreciable change - Basic metabolic panel - PSA - Uric acid - Vitamin B12  4. HYPERKALEMIA Minimize sources of  potassium in the diet patient does see nephrologist - Basic metabolic panel - PSA - Uric acid - Vitamin B12  5. Pedal edema Minimal currently but he may go back to using the torsemide every other day - Basic metabolic panel - PSA - Uric acid - Vitamin B12  6. CKD stage 4 due to type 2 diabetes mellitus [facility]) He is followed by [facility].  It is monitored on a regular basis - Basic metabolic panel - PSA - Uric acid - Vitamin B12  7. Vitamin B12 deficiency Vitamin B12 deficiency we will check that level - Basic metabolic panel - PSA - Uric acid - Vitamin B12  8. Type 2 diabetes mellitus with diabetic neuropathy, with long-term current use of insulin (HCC) Diabetes A1c appreciable.  Continue current measures - Basic metabolic panel - PSA - Uric acid - Vitamin B12  Patient also has skin nodule on the left nostril.  Does not appear to be cancerous and subcutaneous I would watch it for now  Patient was informed to utilize just 1 melatonin in the evening Patient was encouraged to use fiber powder only once daily but may use MiraLAX once daily if necessary to help bowel movements Encourage patient to practice balance exercises Patient was instructed if his sugars are low to adjust downward on Lantus but do not adjust upward unless touching base with [place] As for decreased urinary flow May use 2 Flomax each evening but if that causes dizziness go back to 1 and notify us if it medicine does not help to notify us May safely use  loratadine daily Hold off on any type of neuropathic pain medicine because he only gets sharp pains in his ankle once a month Toenail fungus recommend topical

## 2021-02-28 ENCOUNTER — Other Ambulatory Visit: Payer: Self-pay

## 2021-02-28 DIAGNOSIS — Z794 Long term (current) use of insulin: Secondary | ICD-10-CM

## 2021-02-28 MED ORDER — CONTOUR NEXT TEST VI STRP: ORAL_STRIP | 1 refills | Status: DC

## 2021-03-07 NOTE — Progress Notes (Signed)
Reviewed 12/2020 labs by hematology. Anemia stable. Will continue to follow with them for anemia.

## 2021-03-10 ENCOUNTER — Other Ambulatory Visit: Payer: Self-pay | Admitting: Family Medicine

## 2021-03-10 ENCOUNTER — Other Ambulatory Visit: Payer: Self-pay | Admitting: Student

## 2021-03-25 ENCOUNTER — Other Ambulatory Visit: Payer: Self-pay | Admitting: Family Medicine

## 2021-03-28 DIAGNOSIS — E1169 Type 2 diabetes mellitus with other specified complication: Secondary | ICD-10-CM | POA: Diagnosis not present

## 2021-03-28 DIAGNOSIS — R600 Localized edema: Secondary | ICD-10-CM | POA: Diagnosis not present

## 2021-03-28 DIAGNOSIS — R3912 Poor urinary stream: Secondary | ICD-10-CM | POA: Diagnosis not present

## 2021-03-28 DIAGNOSIS — E1140 Type 2 diabetes mellitus with diabetic neuropathy, unspecified: Secondary | ICD-10-CM | POA: Diagnosis not present

## 2021-03-28 DIAGNOSIS — E1122 Type 2 diabetes mellitus with diabetic chronic kidney disease: Secondary | ICD-10-CM | POA: Diagnosis not present

## 2021-03-28 DIAGNOSIS — E875 Hyperkalemia: Secondary | ICD-10-CM | POA: Diagnosis not present

## 2021-03-28 DIAGNOSIS — N401 Enlarged prostate with lower urinary tract symptoms: Secondary | ICD-10-CM | POA: Diagnosis not present

## 2021-03-28 DIAGNOSIS — I10 Essential (primary) hypertension: Secondary | ICD-10-CM | POA: Diagnosis not present

## 2021-03-28 DIAGNOSIS — Z794 Long term (current) use of insulin: Secondary | ICD-10-CM | POA: Diagnosis not present

## 2021-03-28 DIAGNOSIS — N184 Chronic kidney disease, stage 4 (severe): Secondary | ICD-10-CM | POA: Diagnosis not present

## 2021-03-28 DIAGNOSIS — E538 Deficiency of other specified B group vitamins: Secondary | ICD-10-CM | POA: Diagnosis not present

## 2021-03-28 DIAGNOSIS — E785 Hyperlipidemia, unspecified: Secondary | ICD-10-CM | POA: Diagnosis not present

## 2021-03-29 LAB — HGB A1C W/O EAG: Hgb A1c MFr Bld: 7.7 % — ABNORMAL HIGH (ref 4.8–5.6)

## 2021-03-29 LAB — BASIC METABOLIC PANEL
BUN/Creatinine Ratio: 25 — ABNORMAL HIGH (ref 10–24)
BUN: 40 mg/dL — ABNORMAL HIGH (ref 8–27)
CO2: 21 mmol/L (ref 20–29)
Calcium: 9.7 mg/dL (ref 8.6–10.2)
Chloride: 102 mmol/L (ref 96–106)
Creatinine, Ser: 1.57 mg/dL — ABNORMAL HIGH (ref 0.76–1.27)
Glucose: 97 mg/dL (ref 65–99)
Potassium: 4.7 mmol/L (ref 3.5–5.2)
Sodium: 139 mmol/L (ref 134–144)
eGFR: 43 mL/min/{1.73_m2} — ABNORMAL LOW (ref >59)

## 2021-03-29 LAB — VITAMIN B12: Vitamin B-12: 1742 pg/mL — ABNORMAL HIGH (ref 232–1245)

## 2021-03-29 LAB — PSA: Prostate Specific Ag, Serum: 0.5 ng/mL (ref 0.0–4.0)

## 2021-03-29 LAB — URIC ACID: Uric Acid: 6.8 mg/dL (ref 3.8–8.4)

## 2021-04-01 ENCOUNTER — Telehealth: Payer: Self-pay | Admitting: Family Medicine

## 2021-04-01 MED ORDER — TAMSULOSIN HCL 0.4 MG PO CAPS
ORAL_CAPSULE | ORAL | 0 refills | Status: DC
Start: 2021-04-01 — End: 2021-05-27

## 2021-04-01 NOTE — Telephone Encounter (Signed)
Please confirm with patient that he is using 2 of them each evening rather than one? (When using using 2/day it is recommended to take both of them at bedtime) If that be the case we will give him a prescription for Flomax 0.4 mg 180 tablets each evening with 1 refill Please make him aware that if this causes drowsiness or dizziness to reduce it to just 1/day

## 2021-04-01 NOTE — Telephone Encounter (Signed)
Patient is requesting new prescription for tamsulosin  0.4 because he ran out at his last visit he was told to take twice a day.[place]

## 2021-04-01 NOTE — Telephone Encounter (Signed)
Pt contacted. Pt states he is taking 2 at bedtime. Updated script sent to [facility]. Pt verbalized understanding.

## 2021-04-01 NOTE — Telephone Encounter (Signed)
Please advise. Thank you

## 2021-04-02 ENCOUNTER — Telehealth: Payer: Self-pay | Admitting: Family Medicine

## 2021-04-02 NOTE — Telephone Encounter (Signed)
Estill Bamberg calling from [facility] in [place]. Semglee is not covered by insurance (can do PA); Lantus is covered by insurance and Jerry Burns is wanting to know if it is ok to changed Semglee to Lantus. Please advise. Thank you.

## 2021-04-02 NOTE — Telephone Encounter (Signed)
It would be fine to change to the Lantus The dosing would be the same It is important that the patient understands that this switch was made because of his insurance company covering 1 and not covering the other But the Lantus should work very similar to how his current insulin is working If any problems to let us know

## 2021-04-02 NOTE — Telephone Encounter (Signed)
Pharmacy contacted and gave verbal ok to change to Lantus. Pt contacted and verbalized understanding.

## 2021-04-14 ENCOUNTER — Other Ambulatory Visit: Payer: Self-pay | Admitting: Family Medicine

## 2021-04-19 ENCOUNTER — Ambulatory Visit (INDEPENDENT_AMBULATORY_CARE_PROVIDER_SITE_OTHER): Payer: Medicare Other | Admitting: *Deleted

## 2021-04-19 ENCOUNTER — Other Ambulatory Visit: Payer: Self-pay

## 2021-04-19 DIAGNOSIS — Z0000 Encounter for general adult medical examination without abnormal findings: Secondary | ICD-10-CM

## 2021-04-19 NOTE — Progress Notes (Signed)
Subjective:   Jerry Burns is a 87 y.o. male who presents for an Initial Medicare Annual Wellness Visit.  I connected with  [name] on 04/19/21 by a telephone  and verified that I am speaking with the correct person using two identifiers.   I discussed the limitations, risks, security and privacy concerns of performing an evaluation and management service by telephone and the availability of in person appointments. The patient expressed understanding and agreed to proceed  Location of Patient: Home Location of Provider: telephone Person participating in Virtual Visit: Jerry Burns (patient). Anyelin Mogle (RMA)   Review of Systems    N/A Cardiac Risk Factors include: diabetes mellitus;male gender     Objective:    There were no vitals filed for this visit. There is no height or weight on file to calculate BMI.  Advanced Directives 04/19/2021 01/09/2021 07/05/2020 03/01/2020 11/30/2019 11/30/2019 10/13/2019  Does Patient Have a Medical Advance Directive? Yes Yes Yes Yes Yes Yes Yes  Type of Paramedic of [place];Living will [place];Living will [place];Living will [place];Living will Living will;Healthcare Power of Attorney Living will [place];Living will  Does patient want to make changes to medical advance directive? - No - Patient declined No - Patient declined No - Patient declined No - Patient declined - No - Patient declined  Copy of Healthcare Power of Attorney in Chart? - No - copy requested No - copy requested No - copy requested No - copy requested - No - copy requested  Would patient like information on creating a medical advance directive? - - - - No - Patient declined No - Patient declined No - Patient declined    Current Medications (verified) Outpatient Encounter Medications as of 04/19/2021  Medication Sig   Ascorbic Acid (VITAMIN C) 1000 MG tablet  Take 1,000 mg by mouth daily.    aspirin EC 81 MG tablet Take 81 mg by mouth daily.   atorvastatin (LIPITOR) 80 MG tablet TAKE (1) TABLET BY MOUTH ONCE DAILY.   B-D UF III MINI PEN NEEDLES 31G X 5 MM MISC SMARTSIG:1 Each SUB-Q Twice Daily   carvedilol (COREG) 6.25 MG tablet TAKE (1) TABLET BY MOUTHTTWICE DAILY.   COMBIGAN 0.2-0.5 % ophthalmic solution Place 1 drop into the left eye 2 (two) times daily.    Cyanocobalamin (B-12) 2500 MCG TABS Take 1 tablet by mouth 2 (two) times a week. Three times a week on Monday Wednesday and Friday   Emollient (CERAVE) CREA Apply 1 application topically daily at 6 (six) AM.   empagliflozin (JARDIANCE) 10 MG TABS tablet Take 10 mg by mouth daily.   erythromycin ophthalmic ointment Place 1 application into the left eye 3 (three) times daily as needed (infection).    ferrous sulfate 325 (65 FE) MG EC tablet Take 325 mg by mouth every other day.   Flaxseed, Linseed, (FLAXSEED OIL PO) Take 1 capsule by mouth daily.    glucose blood (CONTOUR NEXT TEST) test strip USE 1 STRIP  TO CHECK GLUCOSE 3 TIMES DAILY   insulin glargine-yfgn (SEMGLEE, YFGN,) 100 UNIT/ML Pen INJECT 24-25 UNITS SUBCUTANEOUSLY ONCE DAILY AT  10:00  PM   insulin lispro (HUMALOG KWIKPEN) 100 UNIT/ML KwikPen Inject twice daily per following: Breakfast and supper <85 none; 85-110 3 units; 111-160 5 units; 161-220 7 units; 221 or higher 9 units   ketoconazole (NIZORAL) 2 % cream Apply 1 application topically daily.  Lactobacillus (PROBIOTIC ACIDOPHILUS) CAPS Take 1 capsule by mouth daily at 6 (six) AM.   LOKELMA 5 g packet    mometasone (ELOCON) 0.1 % cream APPLY TO AFFECTED AREAS ONCE DAILY.   Multiple Vitamin (MULTIVITAMIN) tablet Take 1 tablet by mouth daily.   nitroGLYCERIN (NITROSTAT) 0.4 MG SL tablet PLACE 1 TAB UNDER TONGUE EVERY 5 MIN IF NEEDED FOR CHEST PAIN. MAY USE 3 TIMES.NO RELIEF CALL 911.   polyethylene glycol powder (MIRALAX) 17 GM/SCOOP powder Start by taking 1/2 capful mixed in 4  ounces of fluid daily. You can increase to 1 capful daily if needed to maintain easy BMs.   Polyvinyl Alcohol-Povidone (REFRESH OP) Place 1 drop into both ears 2 (two) times daily as needed (dryness).    sitaGLIPtin (JANUVIA) 100 MG tablet TAKE ONE TABLET BY MOUTH ONCE DAILY.   tamsulosin (FLOMAX) 0.4 MG CAPS capsule Take 2 capsules po at bedtime. if this causes drowsiness or dizziness to reduce it to just 1/day   torsemide (DEMADEX) 20 MG tablet TAKE 1 TO 2 TABLETS BY MOUTH EACH MORNING AS DIRECTED FOR SWELLING IN THE LEGS.   Turmeric 500 MG CAPS Take 500 mg by mouth daily.   VELTASSA 8.4 g packet Take 1 packet by mouth daily.   No facility-administered encounter medications on file as of 04/19/2021.    Allergies (verified) Adhesive [tape], Sulfa antibiotics, and Latex   History: Past Medical History:  Diagnosis Date   Anginal pain ([place])    Aortic atherosclerosis ([place]) 10/06/2020   Seen on CAT scan 2021   Arthritis    Bilateral knee   CHF (congestive heart failure) ([place])    Coronary artery disease    Diabetes mellitus    insulin dependent    DVT, lower extremity, distal, acute ([place]) 09/09/2018   Hyperlipidemia    Hypertension    Progressive angina ([place]) 03/2015   a. s/p DES to proximal LAD and balloon angioplasty to distal LAD in 2016 b. patent stent by repeat cath in 01/2017 with mild residual CAD)   Pulmonary nodule 10/06/2020   Seen on CAT scan 2020 1 repeat CAT scan in 2022   Stroke [facility])    Past Surgical History:  Procedure Laterality Date   APPENDECTOMY     CARDIAC CATHETERIZATION N/A 04/12/2015   Procedure: Left Heart Cath and Coronary Angiography;  Surgeon: Sherren Mocha, MD; pLAD 75>0% w/ 3.0x12 mm Resolute DES, dLAD 80>0% w/ Angiosculpt scoring balloon, CFX luminal irreg, RCA mild dz, EF 55-65%    COLONOSCOPY  2011   RMR: 1. Normal rectum 2. Left sided diverticula , 2 ascending colon polyps, status post snare polypectomy. Remainder of colonic mucosa appeared  unremarkable.    COLONOSCOPY N/A 05/13/2016   Procedure: COLONOSCOPY;  Surgeon: Daneil Dolin, MD;  Location: [facility];  Service: Endoscopy;  Laterality: N/A;  7:30 AM   COLONOSCOPY N/A 05/18/2018   [place]: Diverticulosis, single tubular adenoma removed.  No plans for future screening or surveillance colonoscopy due to age.   CORONARY ANGIOGRAPHY N/A 02/04/2017   Procedure: CORONARY ANGIOGRAPHY (CATH LAB);  Surgeon: Troy Sine, MD;  Location: [facility] CV LAB;  Service: Cardiovascular;  Laterality: N/A;   CORONARY ANGIOPLASTY WITH STENT PLACEMENT  08/22/2014   PTCA/DES X mLAD with 2.5 x 16 mm Promus Premier DES by Dr Julianne Handler   ESOPHAGOGASTRODUODENOSCOPY N/A 05/18/2018   [place]: Ulcerative reflux esophagitis, hiatal hernia, erosive gastropathy with H. pylori on biopsy, also in the setting of NSAID use.   FOOT  SURGERY Right    LEFT HEART CATHETERIZATION WITH CORONARY ANGIOGRAM N/A 08/22/2014   Procedure: LEFT HEART CATHETERIZATION WITH CORONARY ANGIOGRAM;  Surgeon: Burnell Blanks, MD;  Location: [facility] CATH LAB;  Service: Cardiovascular;  Laterality: N/A;   PERCUTANEOUS CORONARY STENT INTERVENTION (PCI-S)  08/22/2014   Procedure: PERCUTANEOUS CORONARY STENT INTERVENTION (PCI-S);  Surgeon: Burnell Blanks, MD;    SHOULDER SURGERY     TONSILLECTOMY     Family History  Problem Relation Age of Onset   Diabetes Other    Diabetes Brother    Colon cancer Maternal Uncle    Skin cancer Mother    Diabetes Father    Hypertension Sister    Skin cancer Sister    Social History   Socioeconomic History   Marital status: Married    Spouse name: Not on file   Number of children: Not on file   Years of education: 14   Highest education level: Not on file  Occupational History   Occupation: retired  Tobacco Use   Smoking status: Former    Packs/day: 1.00    Years: 20.00    Pack years: 20.00    Types: Cigarettes, Cigars    Start date: 08/10/1955    Quit date: 07/28/2007     Years since quitting: 13.7   Smokeless tobacco: Never  Vaping Use   Vaping Use: Never used  Substance and Sexual Activity   Alcohol use: Not Currently    Alcohol/week: 0.0 standard drinks    Comment: Daily about 1 glass of wine and gin   Drug use: No   Sexual activity: Not Currently  Other Topics Concern   Not on file  Social History Narrative   Not on file   Social Determinants of Health   Financial Resource Strain: Low Risk    Difficulty of Paying Living Expenses: Not hard at all  Food Insecurity: No Food Insecurity   Worried About Charity fundraiser in the Last Year: Never true   [place] in the Last Year: Never true  Transportation Needs: No Transportation Needs   Lack of Transportation (Medical): No   Lack of Transportation (Non-Medical): No  Physical Activity: Insufficiently Active   Days of Exercise per Week: 1 day   Minutes of Exercise per Session: 20 min  Stress: No Stress Concern Present   Feeling of Stress : Not at all  Social Connections: Moderately Integrated   Frequency of Communication with Friends and Family: More than three times a week   Frequency of Social Gatherings with Friends and Family: More than three times a week   Attends Religious Services: 1 to 4 times per year   Active Member of [employer] or Organizations: No   Attends Music therapist: Never   Marital Status: Married    Tobacco Counseling Counseling given: Not Answered   Clinical Intake:  Pre-visit preparation completed: Yes  Pain : No/denies pain     Diabetes: Yes  How often do you need to have someone help you when you read instructions, pamphlets, or other written materials from your doctor or pharmacy?: 1 - Never  Diabetic? YES Interpreter Needed?: No      Activities of Daily Living In your present state of health, do you have any difficulty performing the following activities: 04/19/2021  Hearing? N  Vision? Y  Difficulty concentrating or making  decisions? N  Walking or climbing stairs? Y  Dressing or bathing? N  Doing errands, shopping? Y  Preparing  Food and eating ? Y  Using the Toilet? Y  In the past six months, have you accidently leaked urine? N  Do you have problems with loss of bowel control? N  Managing your Medications? Y  Managing your Finances? Y  Housekeeping or managing your Housekeeping? Y  Some recent data might be hidden    Patient Care Team: Kathyrn Drown, MD as PCP - General (Family Medicine) Herminio Commons, MD (Inactive) as PCP - Cardiology (Cardiology) Gala Romney Cristopher Estimable, MD as Consulting Physician (Gastroenterology)  Indicate any recent Medical Services you may have received from other than [facility] providers in the past year (date may be approximate).     Assessment:   This is a routine wellness examination for [place].  Hearing/Vision screen No results found.  Dietary issues and exercise activities discussed: Current Exercise Habits: Home exercise routine, Time (Minutes): 20, Frequency (Times/Week): 1, Weekly Exercise (Minutes/Week): 20, Intensity: Mild, Exercise limited by: None identified   Goals Addressed   None    Depression Screen PHQ 2/9 Scores 04/19/2021 04/19/2021 09/13/2020 06/14/2020 12/20/2019 01/11/2018 12/24/2016  PHQ - 2 Score 0 0 0 1 4 0 0  PHQ- 9 Score - - - 7 19 2  -  Exception Documentation - - - - - - -    Fall Risk Fall Risk  04/19/2021 12/16/2020 10/29/2020 09/13/2020 06/14/2020  Falls in the past year? 0 0 0 0 0  Number falls in past yr: 0 0 - - -  Injury with Fall? 0 0 - - -  Risk for fall due to : Impaired balance/gait No Fall Risks - - -  Risk for fall due to: Comment - - - - -  Follow up Falls evaluation completed Falls evaluation completed Falls evaluation completed Falls evaluation completed Falls evaluation completed    [place]:  Any stairs in or around the home? Yes  If so, are there any without handrails? Yes  Home free of  loose throw rugs in walkways, pet beds, electrical cords, etc? No  Adequate lighting in your home to reduce risk of falls? Yes   ASSISTIVE DEVICES UTILIZED TO PREVENT FALLS:  Life alert? No  Use of a cane, walker or w/c? No  Grab bars in the bathroom? Yes  Shower chair or bench in shower? No  Elevated toilet seat or a handicapped toilet? No   TIMED UP AND GO:  Was the test performed? n/a Length of time to ambulate 10 feet: n/asec.     Cognitive Function:     6CIT Screen 04/19/2021  What Year? 0 points  What month? 0 points  What time? 0 points  Count back from 20 0 points  Months in reverse 0 points  Repeat phrase 0 points  Total Score 0    Immunizations Immunization History  Administered Date(s) Administered   Fluad Quad(high Dose 65+) 04/09/2020   Influenza Whole 05/04/2007, 05/01/2008, 05/04/2008   Influenza,inj,Quad PF,6+ Mos 05/07/2014, 06/06/2015, 04/13/2016, 04/26/2017, 05/19/2018, 04/11/2019   Influenza-Unspecified 04/26/2012, 05/17/2013   PFIZER(Purple Top)SARS-COV-2 Vaccination 08/07/2019, 08/26/2019, 05/09/2020   Pneumococcal Conjugate-13 01/30/2014   Pneumococcal Polysaccharide-23 07/27/2006   Td 05/01/2008, 05/04/2008, 08/30/2017   Tdap 02/12/2019   Zoster Recombinat (Shingrix) 08/28/2017, 02/11/2018   Zoster, Live 07/27/2008    TDAP status: Up to date  Flu Vaccine status: Up to date  Pneumococcal vaccine status: Up to date  Covid-19 vaccine status: Completed vaccines  Qualifies for Shingles Vaccine? Yes   Zostavax completed Yes  Shingrix Completed?: Yes  Screening Tests Health Maintenance  Topic Date Due   COVID-19 Vaccine (4 - Booster for Pfizer series) 08/01/2020   INFLUENZA VACCINE  02/24/2021   OPHTHALMOLOGY EXAM  06/12/2021   HEMOGLOBIN A1C  09/25/2021   URINE MICROALBUMIN  10/22/2021   FOOT EXAM  02/24/2022   COLONOSCOPY (Pts 45-46yrs Insurance coverage will need to be confirmed)  05/19/2023   TETANUS/TDAP  02/11/2029   Zoster  Vaccines- Shingrix  Completed   HPV VACCINES  Aged Out    Health Maintenance  Health Maintenance Due  Topic Date Due   COVID-19 Vaccine (4 - Booster for [place] series) 08/01/2020   INFLUENZA VACCINE  02/24/2021    Colorectal cancer screening: Type of screening: Colonoscopy. Completed 05/18/2018. Repeat every n/a years  Lung Cancer Screening: (Low Dose CT Chest recommended if Age 40-80 years, 30 pack-year currently smoking OR have quit w/in 15years.) does not qualify.   Lung Cancer Screening Referral: n/a  Additional Screening:  Hepatitis C Screening: does not qualify; Completed aged out  Vision Screening: Recommended annual ophthalmology exams for early detection of glaucoma and other disorders of the eye. Is the patient up to date with their annual eye exam?  Yes  Who is the provider or what is the name of the office in which the patient attends annual eye exams? yes If pt is not established with a provider, would they like to be referred to a provider to establish care? No .   Dental Screening: Recommended annual dental exams for proper oral hygiene  Community Resource Referral / Chronic Care Management: CRR required this visit?  No   CCM required this visit?  No      Plan:     I have personally reviewed and noted the following in the patient's chart:   Medical and social history Use of alcohol, tobacco or illicit drugs  Current medications and supplements including opioid prescriptions. Patient is not currently taking opioid prescriptions. Functional ability and status Nutritional status Physical activity Advanced directives List of other physicians Hospitalizations, surgeries, and ER visits in previous 12 months Vitals Screenings to include cognitive, depression, and falls Referrals and appointments  In addition, I have reviewed and discussed with patient certain preventive protocols, quality metrics, and best practice recommendations. A written personalized  care plan for preventive services as well as general preventive health recommendations were provided to patient.     [place], [place]   04/19/2021   Nurse Notes: None face to face 1 hr

## 2021-04-19 NOTE — Patient Instructions (Signed)
Health Maintenance, Male Adopting a healthy lifestyle and getting preventive care are important in promoting health and wellness. Ask your health care provider about: The right schedule for you to have regular tests and exams. Things you can do on your own to prevent diseases and keep yourself healthy. What should I know about diet, weight, and exercise? Eat a healthy diet  Eat a diet that includes plenty of vegetables, fruits, low-fat dairy products, and lean protein. Do not eat a lot of foods that are high in solid fats, added sugars, or sodium. Maintain a healthy weight Body mass index (BMI) is a measurement that can be used to identify possible weight problems. It estimates body fat based on height and weight. Your health care provider can help determine your BMI and help you achieve or maintain a healthy weight. Get regular exercise Get regular exercise. This is one of the most important things you can do for your health. Most adults should: Exercise for at least 150 minutes each week. The exercise should increase your heart rate and make you sweat (moderate-intensity exercise). Do strengthening exercises at least twice a week. This is in addition to the moderate-intensity exercise. Spend less time sitting. Even light physical activity can be beneficial. Watch cholesterol and blood lipids Have your blood tested for lipids and cholesterol at 85 years of age, then have this test every 5 years. You may need to have your cholesterol levels checked more often if: Your lipid or cholesterol levels are high. You are older than 85 years of age. You are at high risk for heart disease. What should I know about cancer screening? Many types of cancers can be detected early and may often be prevented. Depending on your health history and family history, you may need to have cancer screening at various ages. This may include screening for: Colorectal cancer. Prostate cancer. Skin cancer. Lung  cancer. What should I know about heart disease, diabetes, and high blood pressure? Blood pressure and heart disease High blood pressure causes heart disease and increases the risk of stroke. This is more likely to develop in people who have high blood pressure readings, are of African descent, or are overweight. Talk with your health care provider about your target blood pressure readings. Have your blood pressure checked: Every 3-5 years if you are 18-39 years of age. Every year if you are 40 years old or older. If you are between the ages of 65 and 75 and are a current or former smoker, ask your health care provider if you should have a one-time screening for abdominal aortic aneurysm (AAA). Diabetes Have regular diabetes screenings. This checks your fasting blood sugar level. Have the screening done: Once every three years after age 45 if you are at a normal weight and have a low risk for diabetes. More often and at a younger age if you are overweight or have a high risk for diabetes. What should I know about preventing infection? Hepatitis B If you have a higher risk for hepatitis B, you should be screened for this virus. Talk with your health care provider to find out if you are at risk for hepatitis B infection. Hepatitis C Blood testing is recommended for: Everyone born from 1945 through 1965. Anyone with known risk factors for hepatitis C. Sexually transmitted infections (STIs) You should be screened each year for STIs, including gonorrhea and chlamydia, if: You are sexually active and are younger than 85 years of age. You are older than 85 years   of age and your health care provider tells you that you are at risk for this type of infection. Your sexual activity has changed since you were last screened, and you are at increased risk for chlamydia or gonorrhea. Ask your health care provider if you are at risk. Ask your health care provider about whether you are at high risk for HIV.  Your health care provider may recommend a prescription medicine to help prevent HIV infection. If you choose to take medicine to prevent HIV, you should first get tested for HIV. You should then be tested every 3 months for as long as you are taking the medicine. Follow these instructions at home: Lifestyle Do not use any products that contain nicotine or tobacco, such as cigarettes, e-cigarettes, and chewing tobacco. If you need help quitting, ask your health care provider. Do not use street drugs. Do not share needles. Ask your health care provider for help if you need support or information about quitting drugs. Alcohol use Do not drink alcohol if your health care provider tells you not to drink. If you drink alcohol: Limit how much you have to 0-2 drinks a day. Be aware of how much alcohol is in your drink. In the [place] one drink equals one 12 oz bottle of beer (355 mL), one 5 oz glass of wine (148 mL), or one 1 oz glass of hard liquor (44 mL). General instructions Schedule regular health, dental, and eye exams. Stay current with your vaccines. Tell your health care provider if: You often feel depressed. You have ever been abused or do not feel safe at home. Summary Adopting a healthy lifestyle and getting preventive care are important in promoting health and wellness. Follow your health care provider's instructions about healthy diet, exercising, and getting tested or screened for diseases. Follow your health care provider's instructions on monitoring your cholesterol and blood pressure. This information is not intended to replace advice given to you by your health care provider. Make sure you discuss any questions you have with your health care provider. Document Revised: 09/20/2020 Document Reviewed: 07/06/2018 Elsevier Patient Education  2022 Elsevier Inc.  

## 2021-05-05 ENCOUNTER — Other Ambulatory Visit: Payer: Self-pay | Admitting: Family Medicine

## 2021-05-09 DIAGNOSIS — Z23 Encounter for immunization: Secondary | ICD-10-CM | POA: Diagnosis not present

## 2021-05-21 ENCOUNTER — Other Ambulatory Visit: Payer: Medicare Other

## 2021-05-26 DIAGNOSIS — N1832 Chronic kidney disease, stage 3b: Secondary | ICD-10-CM | POA: Diagnosis not present

## 2021-05-27 ENCOUNTER — Ambulatory Visit (INDEPENDENT_AMBULATORY_CARE_PROVIDER_SITE_OTHER): Payer: Medicare Other | Admitting: Family Medicine

## 2021-05-27 ENCOUNTER — Other Ambulatory Visit: Payer: Self-pay

## 2021-05-27 VITALS — BP 130/81 | HR 69 | Temp 97.2°F | Ht 72.0 in | Wt 211.0 lb

## 2021-05-27 DIAGNOSIS — N1832 Chronic kidney disease, stage 3b: Secondary | ICD-10-CM | POA: Diagnosis not present

## 2021-05-27 DIAGNOSIS — E1169 Type 2 diabetes mellitus with other specified complication: Secondary | ICD-10-CM | POA: Diagnosis not present

## 2021-05-27 DIAGNOSIS — I10 Essential (primary) hypertension: Secondary | ICD-10-CM | POA: Diagnosis not present

## 2021-05-27 DIAGNOSIS — I2510 Atherosclerotic heart disease of native coronary artery without angina pectoris: Secondary | ICD-10-CM | POA: Diagnosis not present

## 2021-05-27 DIAGNOSIS — E1122 Type 2 diabetes mellitus with diabetic chronic kidney disease: Secondary | ICD-10-CM | POA: Diagnosis not present

## 2021-05-27 DIAGNOSIS — Z794 Long term (current) use of insulin: Secondary | ICD-10-CM

## 2021-05-27 DIAGNOSIS — E785 Hyperlipidemia, unspecified: Secondary | ICD-10-CM

## 2021-05-27 MED ORDER — TAMSULOSIN HCL 0.4 MG PO CAPS: ORAL_CAPSULE | ORAL | 3 refills | Status: DC

## 2021-05-27 MED ORDER — ATORVASTATIN CALCIUM 80 MG PO TABS: ORAL_TABLET | ORAL | 2 refills | Status: DC

## 2021-05-27 MED ORDER — TORSEMIDE 20 MG PO TABS: ORAL_TABLET | ORAL | 6 refills | Status: DC

## 2021-05-27 NOTE — Progress Notes (Signed)
05/27/21- lab orders placed in Epic

## 2021-05-27 NOTE — Progress Notes (Signed)
Is  Subjective:    Patient ID: Jerry Burns, male    DOB: 1934-02-27, 87 y.o.   MRN: [id number]  HPI  Diabetes follow up - home readings in hand  Diabetes A1c has been moderately elevated his readings do not look bad we are trying to avoid low sugar readings Trying to also improve his regulation We did talk about dietary measures Lab results - kidney levels Limits: GFR is below 40 currently being followed by kidney specialist Dr. Osborne Casco.  Review of Systems     Objective:   Physical Exam  General-in no acute distress Eyes-no discharge Lungs-respiratory rate normal, CTA CV-no murmurs,RRR Extremities skin warm dry no edema Neuro grossly normal Behavior normal, alert       Assessment & Plan:  1. Essential hypertension Blood pressure good control continue current measures watch diet  2. Hyperlipidemia associated with type 2 diabetes mellitus ([name]) Lab work doing well tolerating medicine well diabetes not under great control currently 7.6 goal is to get 7.0 we did review over his glucose readings and encouraged him with diet  3. Type 2 diabetes mellitus with stage 3b chronic kidney disease, with long-term current use of insulin ([name]) Jardiance currently tolerating at 10 mg he will see kidney doctor next week and discuss with them about bumping up the dose of 25 mg Stop Januvia it is of no help Continue current measures.  Patient will give [place] feedback regarding his glucose readings in the next 3 to 4 weeks follow-up in 3 months labs before that visit  Significant time spent with patient discussing her diabetes treatment options and goals

## 2021-06-04 DIAGNOSIS — I129 Hypertensive chronic kidney disease with stage 1 through stage 4 chronic kidney disease, or unspecified chronic kidney disease: Secondary | ICD-10-CM | POA: Diagnosis not present

## 2021-06-04 DIAGNOSIS — Z8673 Personal history of transient ischemic attack (TIA), and cerebral infarction without residual deficits: Secondary | ICD-10-CM | POA: Diagnosis not present

## 2021-06-04 DIAGNOSIS — N1832 Chronic kidney disease, stage 3b: Secondary | ICD-10-CM | POA: Diagnosis not present

## 2021-06-04 DIAGNOSIS — Z86718 Personal history of other venous thrombosis and embolism: Secondary | ICD-10-CM | POA: Diagnosis not present

## 2021-06-04 DIAGNOSIS — E1122 Type 2 diabetes mellitus with diabetic chronic kidney disease: Secondary | ICD-10-CM | POA: Diagnosis not present

## 2021-06-04 DIAGNOSIS — E875 Hyperkalemia: Secondary | ICD-10-CM | POA: Diagnosis not present

## 2021-06-12 DIAGNOSIS — I69398 Other sequelae of cerebral infarction: Secondary | ICD-10-CM | POA: Diagnosis not present

## 2021-06-12 LAB — HM DIABETES EYE EXAM

## 2021-06-16 DIAGNOSIS — N1832 Chronic kidney disease, stage 3b: Secondary | ICD-10-CM | POA: Diagnosis not present

## 2021-06-20 ENCOUNTER — Other Ambulatory Visit: Payer: Self-pay | Admitting: Family Medicine

## 2021-06-20 DIAGNOSIS — E1122 Type 2 diabetes mellitus with diabetic chronic kidney disease: Secondary | ICD-10-CM

## 2021-06-20 DIAGNOSIS — Z794 Long term (current) use of insulin: Secondary | ICD-10-CM

## 2021-06-20 DIAGNOSIS — N1831 Chronic kidney disease, stage 3a: Secondary | ICD-10-CM

## 2021-06-24 DIAGNOSIS — N1832 Chronic kidney disease, stage 3b: Secondary | ICD-10-CM | POA: Diagnosis not present

## 2021-07-11 ENCOUNTER — Inpatient Hospital Stay (HOSPITAL_COMMUNITY): Payer: Medicare Other | Attending: Hematology

## 2021-07-11 DIAGNOSIS — Z87891 Personal history of nicotine dependence: Secondary | ICD-10-CM | POA: Diagnosis not present

## 2021-07-11 DIAGNOSIS — Z808 Family history of malignant neoplasm of other organs or systems: Secondary | ICD-10-CM | POA: Diagnosis not present

## 2021-07-11 DIAGNOSIS — Z800 Family history of malignant neoplasm of digestive organs: Secondary | ICD-10-CM | POA: Insufficient documentation

## 2021-07-11 DIAGNOSIS — D649 Anemia, unspecified: Secondary | ICD-10-CM | POA: Diagnosis not present

## 2021-07-11 DIAGNOSIS — Z86718 Personal history of other venous thrombosis and embolism: Secondary | ICD-10-CM | POA: Diagnosis not present

## 2021-07-11 DIAGNOSIS — K5900 Constipation, unspecified: Secondary | ICD-10-CM | POA: Insufficient documentation

## 2021-07-11 LAB — CBC WITH DIFFERENTIAL/PLATELET
Abs Immature Granulocytes: 0.02 10*3/uL (ref 0.00–0.07)
Basophils Absolute: 0.1 10*3/uL (ref 0.0–0.1)
Basophils Relative: 1 %
Eosinophils Absolute: 0.3 10*3/uL (ref 0.0–0.5)
Eosinophils Relative: 5 %
HCT: 37.9 % — ABNORMAL LOW (ref 39.0–52.0)
Hemoglobin: 12.5 g/dL — ABNORMAL LOW (ref 13.0–17.0)
Immature Granulocytes: 0 %
Lymphocytes Relative: 15 %
Lymphs Abs: 0.8 10*3/uL (ref 0.7–4.0)
MCH: 32 pg (ref 26.0–34.0)
MCHC: 33 g/dL (ref 30.0–36.0)
MCV: 96.9 fL (ref 80.0–100.0)
Monocytes Absolute: 0.8 10*3/uL (ref 0.1–1.0)
Monocytes Relative: 15 %
Neutro Abs: 3.6 10*3/uL (ref 1.7–7.7)
Neutrophils Relative %: 64 %
Platelets: 149 10*3/uL — ABNORMAL LOW (ref 150–400)
RBC: 3.91 MIL/uL — ABNORMAL LOW (ref 4.22–5.81)
RDW: 14.3 % (ref 11.5–15.5)
WBC: 5.6 10*3/uL (ref 4.0–10.5)
nRBC: 0 % (ref 0.0–0.2)

## 2021-07-11 LAB — IRON AND TIBC
Iron: 79 ug/dL (ref 45–182)
Saturation Ratios: 24 % (ref 17.9–39.5)
TIBC: 324 ug/dL (ref 250–450)
UIBC: 245 ug/dL

## 2021-07-11 LAB — FERRITIN: Ferritin: 259 ng/mL (ref 24–336)

## 2021-07-15 NOTE — Progress Notes (Signed)
Jerry Burns [place], [place] [address]   CLINIC:  Medical Oncology/Hematology  PCP:  Jerry Drown, MD [address] [place] [address] [phone]   REASON FOR VISIT:  Follow-up for iron deficiency anemia  PRIOR THERAPY: None  CURRENT THERAPY: Intermittent Feraheme (last on 10/13/2019); oral iron supplementation  INTERVAL HISTORY:  [name] 87 y.o. male returns for routine follow-up of his iron deficiency anemia.  He was last seen on 01/09/2021 by Dr. Delton Burns.  His last IV iron infusion was on 10/13/2019.  At today's visit, he reports feeling fairly well.  No recent hospitalizations, surgeries, or changes in baseline health status.  He denies any bleeding such as hematemesis, epistaxis, hematochezia, or melena.  He reports that his fatigue is at baseline, with energy about 60%.  He denies any pica, restless legs, headaches, chest pain, dyspnea on exertion, lightheadedness, or syncope.  He is not currently taking any blood thinners.  At his last visit, he was started on ferrous sulfate 325 mg every other day along with stool softener.  He is tolerating his iron supplement well.  He has been having some mild to moderate constipation, but he attributes this to starting Jardiance rather than his iron pill.  He has 60% energy and 100% appetite. He endorses that he is maintaining a stable weight.    REVIEW OF SYSTEMS:  Review of Systems  Constitutional:  Positive for fatigue. Negative for appetite change, chills, diaphoresis, fever and unexpected weight change.  HENT:   Negative for lump/mass and nosebleeds.   Eyes:  Negative for eye problems.  Respiratory:  Negative for cough, hemoptysis and shortness of breath.   Cardiovascular:  Negative for chest pain, leg swelling and palpitations.  Gastrointestinal:  Positive for constipation. Negative for abdominal pain, blood in stool, diarrhea, nausea and vomiting.  Genitourinary:  Negative  for hematuria.   Skin: Negative.   Neurological:  Negative for dizziness, headaches and light-headedness.  Hematological:  Does not bruise/bleed easily.     PAST MEDICAL/SURGICAL HISTORY:  Past Medical History:  Diagnosis Date   Anginal pain ([place])    Aortic atherosclerosis ([place]) 10/06/2020   Seen on CAT scan 2021   Arthritis    Bilateral knee   CHF (congestive heart failure) ([place])    Coronary artery disease    Diabetes mellitus    insulin dependent    DVT, lower extremity, distal, acute ([place]) 09/09/2018   Hyperlipidemia    Hypertension    Progressive angina ([place]) 03/2015   a. s/p DES to proximal LAD and balloon angioplasty to distal LAD in 2016 b. patent stent by repeat cath in 01/2017 with mild residual CAD)   Pulmonary nodule 10/06/2020   Seen on CAT scan 2020 1 repeat CAT scan in 2022   Stroke [facility])    Past Surgical History:  Procedure Laterality Date   APPENDECTOMY     CARDIAC CATHETERIZATION N/A 04/12/2015   Procedure: Left Heart Cath and Coronary Angiography;  Surgeon: Jerry Mocha, MD; pLAD 75>0% w/ 3.0x12 mm Resolute DES, dLAD 80>0% w/ Angiosculpt scoring balloon, CFX luminal irreg, RCA mild dz, EF 55-65%    COLONOSCOPY  2011   RMR: 1. Normal rectum 2. Left sided diverticula , 2 ascending colon polyps, status post snare polypectomy. Remainder of colonic mucosa appeared unremarkable.    COLONOSCOPY N/A 05/13/2016   Procedure: COLONOSCOPY;  Surgeon: Jerry Dolin, MD;  Location: [facility];  Service: Endoscopy;  Laterality: N/A;  7:30  AM   COLONOSCOPY N/A 05/18/2018   Jerry Burns: Diverticulosis, single tubular adenoma removed.  No plans for future screening or surveillance colonoscopy due to age.   CORONARY ANGIOGRAPHY N/A 02/04/2017   Procedure: CORONARY ANGIOGRAPHY (CATH LAB);  Surgeon: Jerry Sine, MD;  Location: [facility] LAB;  Service: Cardiovascular;  Laterality: N/A;   CORONARY ANGIOPLASTY WITH STENT PLACEMENT  08/22/2014   PTCA/DES X mLAD with 2.5 x 16 mm  Promus Premier DES by Dr Jerry Burns   ESOPHAGOGASTRODUODENOSCOPY N/A 05/18/2018   Jerry Burns: Ulcerative reflux esophagitis, hiatal hernia, erosive gastropathy with H. pylori on biopsy, also in the setting of NSAID use.   FOOT SURGERY Right    LEFT HEART CATHETERIZATION WITH CORONARY ANGIOGRAM N/A 08/22/2014   Procedure: LEFT HEART CATHETERIZATION WITH CORONARY ANGIOGRAM;  Surgeon: Jerry Blanks, MD;  Location: [facility] CATH LAB;  Service: Cardiovascular;  Laterality: N/A;   PERCUTANEOUS CORONARY STENT INTERVENTION (PCI-S)  08/22/2014   Procedure: PERCUTANEOUS CORONARY STENT INTERVENTION (PCI-S);  Surgeon: Jerry Blanks, MD;    SHOULDER SURGERY     TONSILLECTOMY       SOCIAL HISTORY:  Social History   Socioeconomic History   Marital status: Married    Spouse name: Not on file   Number of children: Not on file   Years of education: 14   Highest education level: Not on file  Occupational History   Occupation: retired  Tobacco Use   Smoking status: Former    Packs/day: 1.00    Years: 20.00    Pack years: 20.00    Types: Cigarettes, Cigars    Start date: 08/10/1955    Quit date: 07/28/2007    Years since quitting: 13.9   Smokeless tobacco: Never  Vaping Use   Vaping Use: Never used  Substance and Sexual Activity   Alcohol use: Not Currently    Alcohol/week: 0.0 standard drinks    Comment: Daily about 1 glass of wine and gin   Drug use: No   Sexual activity: Not Currently  Other Topics Concern   Not on file  Social History Narrative   Not on file   Social Determinants of Health   Financial Resource Strain: Low Risk    Difficulty of Paying Living Expenses: Not hard at all  Food Insecurity: No Food Insecurity   Worried About Charity fundraiser in the Last Year: Never true   [place] in the Last Year: Never true  Transportation Needs: No Transportation Needs   Lack of Transportation (Medical): No   Lack of Transportation (Non-Medical): No  Physical Activity:  Insufficiently Active   Days of Exercise per Week: 1 day   Minutes of Exercise per Session: 20 min  Stress: No Stress Concern Present   Feeling of Stress : Not at all  Social Connections: Not on file  Intimate Partner Violence: Not At Risk   Fear of Current or Ex-Partner: No   Emotionally Abused: No   Physically Abused: No   Sexually Abused: No    FAMILY HISTORY:  Family History  Problem Relation Age of Onset   Diabetes Other    Diabetes Brother    Colon cancer Maternal Uncle    Skin cancer Mother    Diabetes Father    Hypertension Sister    Skin cancer Sister     CURRENT MEDICATIONS:  Outpatient Encounter Medications as of 07/16/2021  Medication Sig   Ascorbic Acid (VITAMIN C) 1000 MG tablet Take 1,000 mg by mouth daily.  aspirin EC 81 MG tablet Take 81 mg by mouth daily.   atorvastatin (LIPITOR) 80 MG tablet 1 qd   B-D UF III MINI PEN NEEDLES 31G X 5 MM MISC SMARTSIG:1 Each SUB-Q Twice Daily   carvedilol (COREG) 6.25 MG tablet TAKE (1) TABLET BY MOUTHTTWICE DAILY.   COMBIGAN 0.2-0.5 % ophthalmic solution Place 1 drop into the left eye 2 (two) times daily.    Cyanocobalamin (B-12) 2500 MCG TABS Take 1 tablet by mouth 2 (two) times a week. Three times a week on Monday Wednesday and Friday   Emollient (CERAVE) CREA Apply 1 application topically daily at 6 (six) AM.   empagliflozin (JARDIANCE) 10 MG TABS tablet Take 10 mg by mouth daily.   erythromycin ophthalmic ointment Place 1 application into the left eye 3 (three) times daily as needed (infection).    ferrous sulfate 325 (65 FE) MG EC tablet Take 325 mg by mouth every other day.   Flaxseed, Linseed, (FLAXSEED OIL PO) Take 1 capsule by mouth daily.    glucose blood (CONTOUR NEXT TEST) test strip USE 1 STRIP TO CHECK GLUCOSE THREE TIMES DAILY   insulin glargine-yfgn (SEMGLEE, YFGN,) 100 UNIT/ML Pen INJECT 24-25 UNITS SUBCUTANEOUSLY ONCE DAILY AT  10:00  PM   insulin lispro (HUMALOG KWIKPEN) 100 UNIT/ML KwikPen Inject  twice daily per following: Breakfast and supper <85 none; 85-110 3 units; 111-160 5 units; 161-220 7 units; 221 or higher 9 units   ketoconazole (NIZORAL) 2 % cream Apply 1 application topically daily.   Lactobacillus (PROBIOTIC ACIDOPHILUS) CAPS Take 1 capsule by mouth daily at 6 (six) AM.   LOKELMA 5 g packet    mometasone (ELOCON) 0.1 % cream APPLY TO AFFECTED AREAS ONCE DAILY.   Multiple Vitamin (MULTIVITAMIN) tablet Take 1 tablet by mouth daily.   nitroGLYCERIN (NITROSTAT) 0.4 MG SL tablet PLACE 1 TAB UNDER TONGUE EVERY 5 MIN IF NEEDED FOR CHEST PAIN. MAY USE 3 TIMES.NO RELIEF CALL 911.   polyethylene glycol powder (MIRALAX) 17 GM/SCOOP powder Start by taking 1/2 capful mixed in 4 ounces of fluid daily. You can increase to 1 capful daily if needed to maintain easy BMs.   Polyvinyl Alcohol-Povidone (REFRESH OP) Place 1 drop into both ears 2 (two) times daily as needed (dryness).    tamsulosin (FLOMAX) 0.4 MG CAPS capsule Take 2 capsules po at bedtime. if this causes drowsiness or dizziness to reduce it to just 1/day   torsemide (DEMADEX) 20 MG tablet TAKE 1 TO 2 TABLETS BY MOUTH EACH MORNING AS DIRECTED FOR SWELLING IN THE LEGS.   Turmeric 500 MG CAPS Take 500 mg by mouth daily.   VELTASSA 8.4 g packet Take 1 packet by mouth daily.   No facility-administered encounter medications on file as of 07/16/2021.    ALLERGIES:  Allergies  Allergen Reactions   Adhesive [Tape]     bandaids cause blisters after leaving on for 24 hours    Sulfa Antibiotics Other (See Comments)    "was terrible" "eye went crazy"   Latex Other (See Comments)    blisters     PHYSICAL EXAM:  ECOG PERFORMANCE STATUS: 1 - Symptomatic but completely ambulatory  There were no vitals filed for this visit. There were no vitals filed for this visit. Physical Exam Constitutional:      Appearance: Normal appearance. He is obese.  HENT:     Head: Normocephalic and atraumatic.     Mouth/Throat:     Mouth: Mucous  membranes are moist.  Eyes:  Extraocular Movements: Extraocular movements intact.     Pupils: Pupils are equal, round, and reactive to light.  Cardiovascular:     Rate and Rhythm: Normal rate and regular rhythm.     Pulses: Normal pulses.     Heart sounds: Normal heart sounds.  Pulmonary:     Effort: Pulmonary effort is normal.     Breath sounds: Normal breath sounds.  Abdominal:     General: Bowel sounds are normal.     Palpations: Abdomen is soft.     Tenderness: There is no abdominal tenderness.  Musculoskeletal:        General: No swelling.     Right lower leg: No edema.     Left lower leg: Edema (trace) present.  Lymphadenopathy:     Cervical: No cervical adenopathy.  Skin:    General: Skin is warm and dry.  Neurological:     General: No focal deficit present.     Mental Status: He is alert and oriented to person, place, and time.  Psychiatric:        Mood and Affect: Mood normal.        Behavior: Behavior normal.     LABORATORY DATA:  I have reviewed the labs as listed.  CBC    Component Value Date/Time   WBC 5.6 07/11/2021 0815   RBC 3.91 (L) 07/11/2021 0815   HGB 12.5 (L) 07/11/2021 0815   HGB 10.2 (L) 12/11/2019 1037   HCT 37.9 (L) 07/11/2021 0815   HCT 30.7 (L) 12/11/2019 1037   PLT 149 (L) 07/11/2021 0815   PLT 264 12/11/2019 1037   MCV 96.9 07/11/2021 0815   MCV 94 12/11/2019 1037   MCH 32.0 07/11/2021 0815   MCHC 33.0 07/11/2021 0815   RDW 14.3 07/11/2021 0815   RDW 12.4 12/11/2019 1037   LYMPHSABS 0.8 07/11/2021 0815   LYMPHSABS 0.8 12/11/2019 1037   MONOABS 0.8 07/11/2021 0815   EOSABS 0.3 07/11/2021 0815   EOSABS 0.3 12/11/2019 1037   BASOSABS 0.1 07/11/2021 0815   BASOSABS 0.1 12/11/2019 1037   CMP Latest Ref Rng & Units 03/28/2021 01/28/2021 10/22/2020  Glucose 65 - 99 mg/dL 97 107(H) 213(H)  BUN 8 - 27 mg/dL 40(H) 40(H) 40(H)  Creatinine 0.76 - 1.27 mg/dL 1.57(H) 1.72(H) 1.41(H)  Sodium 134 - 144 mmol/L 139 139 140  Potassium 3.5 - 5.2  mmol/L 4.7 4.6 5.0  Chloride 96 - 106 mmol/L 102 97 100  CO2 20 - 29 mmol/L 21 27 22   Calcium 8.6 - 10.2 mg/dL 9.7 9.7 9.8  Total Protein 6.5 - 8.1 g/dL - - -  Total Bilirubin 0.3 - 1.2 mg/dL - - -  Alkaline Phos 38 - 126 U/L - - -  AST 15 - 41 U/L - - -  ALT 0 - 44 U/L - - -    DIAGNOSTIC IMAGING:  I have independently reviewed the relevant imaging and discussed with the patient.  ASSESSMENT & PLAN: 1.   Normocytic anemia: - Thought to be from GI blood loss. - He had a hemoglobin of 9.7 in September 2019 followed by a GI work-up. - He had an EGD on 05/18/2018 showed erosive reflux esophagitis, erosive gastropathy.  Biopsy was consistent with chronic active gastritis with H. pylori.  It was thought that he could have been bleeding from upper GI. - Colonoscopy on 05/18/2018 showed diverticulosis of the sigmoid colon, descending colon and transverse colon.  Tubular adenoma from hepatic flexure was resected. - During his initial  work up, stool for occult blood x3 was negative.  Direct Coombs test and LDH were normal.  O59 and folic acid were normal.  - Last Feraheme on 10/06/2019 and 10/13/2019. - He is taking oral ferrous sulfate 3 times per week and is tolerating it well -No bright red blood per rectum or melena -He has chronic fatigue, which he reports is slightly improved from previous - Most recent labs (07/11/2021): Hgb 12.5/MCV 96.9, platelets 149, ferritin 259, iron saturation 24% - PLAN: No indication for IV iron at this time. - Continue oral iron supplementation. - Repeat labs and RTC in 12 months. - Patient educated on alarm symptoms that would warrant more immediate medical attention or return visit.  2.  Distal DVT: - He had an ultrasound on 09/03/2018 showed possible nonocclusive thrombus of the intermediate acuity within 1 of the posterior tibial veins in the right calf. - A CT scan of the chest PE protocol could not be done as he is unable to lie flat at that time - He  completed Eliquis x6 months - No current signs or symptoms of recurrent DVT    PLAN SUMMARY & DISPOSITION: Continue oral iron supplementation 3 days/week Labs in one 1 year RTC after labs Patient educated on alarm symptoms that would warrant more immediate medical attention or return visit.  All questions were answered. The patient knows to call the clinic with any problems, questions or concerns.  Medical decision making: Low  Time spent on visit: I spent 15 minutes counseling the patient face to face. The total time spent in the appointment was 20 minutes and more than 50% was on counseling.   Harriett Rush, PA-C  07/16/2021 10:53 AM

## 2021-07-16 ENCOUNTER — Other Ambulatory Visit: Payer: Self-pay

## 2021-07-16 ENCOUNTER — Inpatient Hospital Stay (HOSPITAL_BASED_OUTPATIENT_CLINIC_OR_DEPARTMENT_OTHER): Payer: Medicare Other | Admitting: Physician Assistant

## 2021-07-16 VITALS — BP 128/74 | HR 69 | Temp 96.9°F | Resp 18 | Wt 211.6 lb

## 2021-07-16 DIAGNOSIS — D649 Anemia, unspecified: Secondary | ICD-10-CM | POA: Diagnosis not present

## 2021-07-16 DIAGNOSIS — K5900 Constipation, unspecified: Secondary | ICD-10-CM | POA: Diagnosis not present

## 2021-07-16 DIAGNOSIS — Z87891 Personal history of nicotine dependence: Secondary | ICD-10-CM | POA: Diagnosis not present

## 2021-07-16 DIAGNOSIS — Z800 Family history of malignant neoplasm of digestive organs: Secondary | ICD-10-CM | POA: Diagnosis not present

## 2021-07-16 DIAGNOSIS — Z808 Family history of malignant neoplasm of other organs or systems: Secondary | ICD-10-CM | POA: Diagnosis not present

## 2021-07-16 DIAGNOSIS — Z86718 Personal history of other venous thrombosis and embolism: Secondary | ICD-10-CM | POA: Diagnosis not present

## 2021-07-16 NOTE — Patient Instructions (Signed)
[facility] at [facility] Discharge Instructions  You were seen today by Katarzyna Wolven PA-C for your iron deficiency anemia.  Your blood and iron levels looks great today!  We will check labs and see you for an office visit in 1 year.  In the meantime, continue to take your iron supplement 3 times per week.   Thank you for choosing [facility] at [facility] to provide your oncology and hematology care.  To afford each patient quality time with our provider, please arrive at least 15 minutes before your scheduled appointment time.   If you have a lab appointment with the Cancer Center please come in thru the Main Entrance and check in at the main information desk.  You need to re-schedule your appointment should you arrive 10 or more minutes late.  We strive to give you quality time with our providers, and arriving late affects you and other patients whose appointments are after yours.  Also, if you no show three or more times for appointments you may be dismissed from the clinic at the providers discretion.     Again, thank you for choosing [facility].  Our hope is that these requests will decrease the amount of time that you wait before being seen by our physicians.       _____________________________________________________________  Should you have questions after your visit to [facility], please contact our office at [phone] and follow the prompts.  Our office hours are 8:00 a.m. and 4:30 p.m. Monday - Friday.  Please note that voicemails left after 4:00 p.m. may not be returned until the following business day.  We are closed weekends and major holidays.  You do have access to a nurse 24-7, just call the main number to the clinic [phone] and do not press any options, hold on the line and a nurse will answer the phone.    For prescription refill requests, have your pharmacy contact our office and allow 72  hours.    Due to Covid, you will need to wear a mask upon entering the hospital. If you do not have a mask, a mask will be given to you at the Main Entrance upon arrival. For doctor visits, patients may have 1 support person age 18 or older with them. For treatment visits, patients can not have anyone with them due to social distancing guidelines and our immunocompromised population.    

## 2021-07-17 ENCOUNTER — Encounter: Payer: Self-pay | Admitting: Family Medicine

## 2021-07-18 ENCOUNTER — Ambulatory Visit (HOSPITAL_COMMUNITY): Payer: Medicare Other | Admitting: Physician Assistant

## 2021-08-19 DIAGNOSIS — E1169 Type 2 diabetes mellitus with other specified complication: Secondary | ICD-10-CM | POA: Diagnosis not present

## 2021-08-19 DIAGNOSIS — N1832 Chronic kidney disease, stage 3b: Secondary | ICD-10-CM | POA: Diagnosis not present

## 2021-08-19 DIAGNOSIS — E1122 Type 2 diabetes mellitus with diabetic chronic kidney disease: Secondary | ICD-10-CM | POA: Diagnosis not present

## 2021-08-19 DIAGNOSIS — I10 Essential (primary) hypertension: Secondary | ICD-10-CM | POA: Diagnosis not present

## 2021-08-19 DIAGNOSIS — E785 Hyperlipidemia, unspecified: Secondary | ICD-10-CM | POA: Diagnosis not present

## 2021-08-19 DIAGNOSIS — Z794 Long term (current) use of insulin: Secondary | ICD-10-CM | POA: Diagnosis not present

## 2021-08-20 LAB — MICROALBUMIN / CREATININE URINE RATIO
Creatinine, Urine: 35.3 mg/dL
Microalb/Creat Ratio: 139 mg/g creat — ABNORMAL HIGH (ref 0–29)
Microalbumin, Urine: 49 ug/mL

## 2021-08-20 LAB — LIPID PANEL
Chol/HDL Ratio: 2.8 ratio (ref 0.0–5.0)
Cholesterol, Total: 148 mg/dL (ref 100–199)
HDL: 52 mg/dL (ref >39)
LDL Chol Calc (NIH): 83 mg/dL (ref 0–99)
Triglycerides: 66 mg/dL (ref 0–149)
VLDL Cholesterol Cal: 13 mg/dL (ref 5–40)

## 2021-08-20 LAB — BASIC METABOLIC PANEL
BUN/Creatinine Ratio: 29 — ABNORMAL HIGH (ref 10–24)
BUN: 49 mg/dL — ABNORMAL HIGH (ref 8–27)
CO2: 25 mmol/L (ref 20–29)
Calcium: 10.2 mg/dL (ref 8.6–10.2)
Chloride: 104 mmol/L (ref 96–106)
Creatinine, Ser: 1.7 mg/dL — ABNORMAL HIGH (ref 0.76–1.27)
Glucose: 97 mg/dL (ref 70–99)
Potassium: 4.8 mmol/L (ref 3.5–5.2)
Sodium: 143 mmol/L (ref 134–144)
eGFR: 39 mL/min/{1.73_m2} — ABNORMAL LOW (ref >59)

## 2021-08-20 LAB — HEMOGLOBIN A1C
Est. average glucose Bld gHb Est-mCnc: 186 mg/dL
Hgb A1c MFr Bld: 8.1 % — ABNORMAL HIGH (ref 4.8–5.6)

## 2021-08-27 ENCOUNTER — Ambulatory Visit: Payer: Medicare Other | Admitting: Family Medicine

## 2021-09-05 ENCOUNTER — Ambulatory Visit (INDEPENDENT_AMBULATORY_CARE_PROVIDER_SITE_OTHER): Payer: Medicare Other | Admitting: Family Medicine

## 2021-09-05 ENCOUNTER — Other Ambulatory Visit: Payer: Self-pay

## 2021-09-05 VITALS — BP 117/72 | HR 70 | Temp 98.1°F | Wt 214.0 lb

## 2021-09-05 DIAGNOSIS — R600 Localized edema: Secondary | ICD-10-CM | POA: Diagnosis not present

## 2021-09-05 DIAGNOSIS — I5032 Chronic diastolic (congestive) heart failure: Secondary | ICD-10-CM

## 2021-09-05 DIAGNOSIS — Z794 Long term (current) use of insulin: Secondary | ICD-10-CM

## 2021-09-05 DIAGNOSIS — E1122 Type 2 diabetes mellitus with diabetic chronic kidney disease: Secondary | ICD-10-CM

## 2021-09-05 DIAGNOSIS — N1832 Chronic kidney disease, stage 3b: Secondary | ICD-10-CM | POA: Diagnosis not present

## 2021-09-05 DIAGNOSIS — I10 Essential (primary) hypertension: Secondary | ICD-10-CM

## 2021-09-05 NOTE — Progress Notes (Signed)
° °  Subjective:    Patient ID: Jerry Burns, male    DOB: 1934-07-27, 88 y.o.   MRN: [id number]  Diabetes He presents for his follow-up diabetic visit. He has type 2 diabetes mellitus. Current diabetic treatments: jardiance, semglee, semglee, humalog.  Hypertension  Essential hypertension - Plan: Hemoglobin T4H, Basic Metabolic Panel (BMET)  Type 2 diabetes mellitus with stage 3b chronic kidney disease, with long-term current use of insulin (Jerry Burns) - Plan: Hemoglobin D6Q, Basic Metabolic Panel (BMET)  Pedal edema  Chronic diastolic heart failure (Jerry Burns), Chronic Patient denies significant shortness of breath or PND On multiple medicines These were reviewed Also history of renal CKD associated with blood pressure and diabetes Significant edema in the lower legs although has backed off on torsemide only use utilizing it perhaps every other day   Review of Systems     Objective:   Physical Exam General-in no acute distress Eyes-no discharge Lungs-respiratory rate normal, CTA CV-no murmurs,RRR Extremities skin warm dry mild lower leg edema Neuro grossly normal Behavior normal, alert No crackles in the lungs I reviewed overall his glucose readings pain particular attention to the most recent ones on intensive regimen checks glucose multiple times a day      Assessment & Plan:  1. Essential hypertension Blood pressure good control continue current measures watch diet closely - Hemoglobin I2L - Basic Metabolic Panel (BMET)  2. Type 2 diabetes mellitus with stage 3b chronic kidney disease, with long-term current use of insulin (Jerry Burns) A1c higher than what we would like recently patient adjusted his insulin numbers look much better recheck A1c in 3 months with follow-up labs - Hemoglobin N9G - Basic Metabolic Panel (BMET)  3. Pedal edema He may use of torsemide every other day but try to avoid using it every single day because of potential negative effects on kidney  4.  Chronic diastolic heart failure (Jerry Burns) No sign of overt failure currently  5.  CKD stage III Will need to be followed closely.  He vacillates between stage III and stage IV  Follow-up 3 months

## 2021-09-08 ENCOUNTER — Encounter: Payer: Self-pay | Admitting: Cardiology

## 2021-09-10 ENCOUNTER — Other Ambulatory Visit: Payer: Self-pay | Admitting: Family Medicine

## 2021-09-10 DIAGNOSIS — E1122 Type 2 diabetes mellitus with diabetic chronic kidney disease: Secondary | ICD-10-CM

## 2021-09-16 ENCOUNTER — Other Ambulatory Visit: Payer: Self-pay | Admitting: Family Medicine

## 2021-09-16 DIAGNOSIS — N1831 Chronic kidney disease, stage 3a: Secondary | ICD-10-CM

## 2021-09-16 DIAGNOSIS — Z794 Long term (current) use of insulin: Secondary | ICD-10-CM

## 2021-11-05 NOTE — Progress Notes (Signed)
? ?Cardiology Office Note   ? ?Date:  11/17/2021  ? ?ID:  Jerry Burns, DOB 01-18-1934, MRN [id number] ? ? ?PCP:  Kathyrn Drown, MD ?  ?[place]  ?Cardiologist:  Carlyle Dolly, MD   ?Advanced Practice Provider:  No care team member to display ?Electrophysiologist:  None  ? ?[id number]}  ? ?Chief Complaint  ?Patient presents with  ? Follow-up  ? ? ?History of Present Illness:  ?Jerry Burns is a 88 y.o. male  with history of CAD status post DES to the LAD 2016, repeat cath 2018 patent stent, chronic diastolic CHF Echo 0/9735 LVEF 55 to 60%, small to moderate pericardial effusion, CKD. ? ?Patient last saw Dr. Harl Bowie 09/2020 via telemedicine and was doing well.  No changes made.  ? ?Denies chest pain. Has chronic DOE that has probably improved over time. Walks 1/2 mile and gets short of breath. Chronic LE edema. Disappointed he can't drive a tractor, run 2 miles, cut wood. ? ? ?Past Medical History:  ?Diagnosis Date  ? Anginal pain ([place])   ? Aortic atherosclerosis ([place]) 10/06/2020  ? Seen on CAT scan 2021  ? Arthritis   ? Bilateral knee  ? CHF (congestive heart failure) ([place])   ? Coronary artery disease   ? 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 DVT, lower extremity, distal, acute ([place]) 09/09/2018  ? Hyperlipidemia   ? Hypertension   ? Progressive angina ([place]) 03/2015  ? a. s/p DES to proximal LAD and balloon angioplasty to distal LAD in 2016 b. patent stent by repeat cath in 01/2017 with mild residual CAD)  ? Pulmonary nodule 10/06/2020  ? Seen on CAT scan 2020 1 repeat CAT scan in 2022  ? Stroke [facility])   ? ? ?Past Surgical History:  ?Procedure Laterality Date  ? APPENDECTOMY    ? CARDIAC CATHETERIZATION N/A 04/12/2015  ? Procedure: Left Heart Cath and Coronary Angiography;  Surgeon: Sherren Mocha, MD; pLAD 75>0% w/ 3.0x12 mm Resolute DES, dLAD 80>0% w/ Angiosculpt scoring balloon, CFX luminal irreg, RCA mild dz, EF 55-65%   ? COLONOSCOPY  2011  ? RMR: 1. Normal rectum 2. Left  sided diverticula , 2 ascending colon polyps, status post snare polypectomy. Remainder of colonic mucosa appeared unremarkable.   ? COLONOSCOPY N/A 05/13/2016  ? Procedure: COLONOSCOPY;  Surgeon: Daneil Dolin, MD;  [facility];  Service: Endoscopy;  Laterality: N/A;  7:30 AM  ? COLONOSCOPY N/A 05/18/2018  ? Rourk: Diverticulosis, single tubular adenoma removed.  No plans for future screening or surveillance colonoscopy due to age.  ? CORONARY ANGIOGRAPHY N/A 02/04/2017  ? Procedure: CORONARY ANGIOGRAPHY (CATH LAB);  Surgeon: Troy Sine, MD;  Location: [place] [facility] LAB;  Service: Cardiovascular;  Laterality: N/A;  ? CORONARY ANGIOPLASTY WITH STENT PLACEMENT  08/22/2014  ? PTCA/DES X mLAD with 2.5 x 16 mm Promus Premier DES by Dr Julianne Handler  ? ESOPHAGOGASTRODUODENOSCOPY N/A 05/18/2018  ? Rourk: Ulcerative reflux esophagitis, hiatal hernia, erosive gastropathy with H. pylori on biopsy, also in the setting of NSAID use.  ? FOOT SURGERY Right   ? LEFT HEART CATHETERIZATION WITH CORONARY ANGIOGRAM N/A 08/22/2014  ? Procedure: LEFT HEART CATHETERIZATION WITH CORONARY ANGIOGRAM;  Surgeon: Burnell Blanks, MD;  Location: [facility] CATH LAB;  Service: Cardiovascular;  Laterality: N/A;  ? PERCUTANEOUS CORONARY STENT INTERVENTION (PCI-S)  08/22/2014  ? Procedure: PERCUTANEOUS CORONARY STENT INTERVENTION (PCI-S);  Surgeon: Burnell Blanks, MD;   ? [facility]  SUR[facility]    ? TONSILLECTOMY    ? ? ?Current Medications: ?Current Meds  ?Medication Sig  ? Ascorbic Acid (VITAMIN C) 1000 MG tablet Take 1,000 mg by mouth daily.   ? atorvastatin (LIPITOR) 80 MG tablet 1 qd  ? B-D UF III MINI PEN NEEDLES 31G X 5 MM MISC SMARTSIG:1 Each SUB-Q Twice Daily  ? carvedilol (COREG) 6.25 MG tablet TAKE (1) TABLET BY MOUTHTTWICE DAILY.  ? COMBIGAN 0.2-0.5 % ophthalmic solution Place 1 drop into the left eye 2 (two) times daily.   ? Cyanocobalamin (B-12) 2500 MCG TABS Take 1 tablet by mouth 2 (two) times a week. Three times a week  on Monday Wednesday and Friday  ? Emollient (CERAVE) CREA Apply 1 application topically daily at 6 (six) AM.  ? empagliflozin (JARDIANCE) 10 MG TABS tablet Take 10 mg by mouth daily.  ? erythromycin ophthalmic ointment Place 1 application into the left eye 3 (three) times daily as needed (infection).   ? ferrous sulfate 325 (65 FE) MG EC tablet Take 325 mg by mouth every other day.  ? Flaxseed, Linseed, (FLAXSEED OIL PO) Take 1 capsule by mouth daily.   ? glucose blood (CONTOUR NEXT TEST) test strip USE 1 STRIP TO CHECK GLUCOSE THREE TIMES DAILY dx code E11.22, N18.31, Z79.4  ? insulin glargine-yfgn (SEMGLEE, YFGN,) 100 UNIT/ML Pen INJECT 24-25 UNITS SUBCUTANEOUSLY ONCE DAILY AT  10:00  PM  ? insulin lispro (HUMALOG KWIKPEN) 100 UNIT/ML KwikPen Inject twice daily per following: Breakfast and supper <85 none; 85-110 3 units; 111-160 5 units; 161-220 7 units; 221 or higher 9 units  ? JARDIANCE 25 MG TABS tablet Take 25 mg by mouth daily.  ? ketoconazole (NIZORAL) 2 % cream Apply 1 application topically daily.  ? Lactobacillus (PROBIOTIC ACIDOPHILUS) CAPS Take 1 capsule by mouth daily at 6 (six) AM.  ? LANTUS SOLOSTAR 100 UNIT/ML Solostar Pen SMARTSIG:24-25 Unit(s) SUB-Q Every Night  ? LOKELMA 5 g packet   ? mometasone (ELOCON) 0.1 % cream APPLY TO AFFECTED AREAS ONCE DAILY.  ? Multiple Vitamin (MULTIVITAMIN) tablet Take 1 tablet by mouth daily.  ? nitroGLYCERIN (NITROSTAT) 0.4 MG SL tablet PLACE 1 TAB UNDER TONGUE EVERY 5 MIN IF NEEDED FOR CHEST PAIN. MAY USE 3 TIMES.NO RELIEF CALL 911.  ? polyethylene glycol powder (MIRALAX) 17 GM/SCOOP powder Start by taking 1/2 capful mixed in 4 ounces of fluid daily. You can increase to 1 capful daily if needed to maintain easy BMs.  ? Polyvinyl Alcohol-Povidone (REFRESH OP) Place 1 drop into both ears 2 (two) times daily as needed (dryness).  ? tamsulosin (FLOMAX) 0.4 MG CAPS capsule Take 2 capsules po at bedtime. if this causes drowsiness or dizziness to reduce it to just 1/day   ? torsemide (DEMADEX) 20 MG tablet TAKE 1 TO 2 TABLETS BY MOUTH EACH MORNING AS DIRECTED FOR SWELLING IN THE LEGS.  ? Turmeric 500 MG CAPS Take 500 mg by mouth daily.  ? VELTASSA 8.4 g packet Take 1 packet by mouth daily.  ?  ? ?Allergies:   Adhesive [tape], Sulfa antibiotics, and Latex  ? ?Social History  ? ?Socioeconomic History  ? Marital status: Married  ?  Spouse name: Not on file  ? Number of children: Not on file  ? Years of education: 66  ? Highest education level: Not on file  ?Occupational History  ? Occupation: retired  ?Tobacco Use  ? Smoking status: Former  ?  Packs/day: 1.00  ?  Years: 20.00  ?  Pack years: 20.00  ?  Types: Cigarettes, Cigars  ?  Start date: 08/10/1955  ?  Quit date: 07/28/2007  ?  Years since quitting: 14.3  ? Smokeless tobacco: Never  ?Vaping Use  ? Vaping Use: Never used  ?Substance and Sexual Activity  ? Alcohol use: Not Currently  ?  Alcohol/week: 0.0 standard drinks  ?  Comment: Daily about 1 glass of wine and gin  ? Drug use: No  ? Sexual activity: Not Currently  ?Other Topics Concern  ? Not on file  ?Social History Narrative  ? Not on file  ? ?Social Determinants of Health  ? ?Financial Resource Strain: Low Risk   ? Difficulty of Paying Living Expenses: Not hard at all  ?Food Insecurity: No Food Insecurity  ? Worried About Charity fundraiser in the Last Year: Never true  ? Ran Out of Food in the Last Year: Never true  ?Transportation Needs: No Transportation Needs  ? Lack of Transportation (Medical): No  ? Lack of Transportation (Non-Medical): No  ?Physical Activity: Insufficiently Active  ? Days of Exercise per Week: 1 day  ? Minutes of Exercise per Session: 20 min  ?Stress: No Stress Concern Present  ? Feeling of Stress : Not at all  ?Social Connections: Not on file  ?  ? ?Family History:  The patient's  family history includes Colon cancer in his maternal uncle; Diabetes in his brother, father, and another family member; Hypertension in his sister; Skin cancer in his mother  and sister.  ? ?ROS:   ?Please see the history of present illness.    ?ROS All other systems reviewed and are negative. ? ? ?PHYSICAL EXAM:   ?VS:  BP 112/68   Pulse 86   Ht 6' (1.829 m)   Wt 215 lb (97.5

## 2021-11-10 ENCOUNTER — Encounter: Payer: Self-pay | Admitting: Internal Medicine

## 2021-11-17 ENCOUNTER — Ambulatory Visit (INDEPENDENT_AMBULATORY_CARE_PROVIDER_SITE_OTHER): Payer: Medicare Other

## 2021-11-17 ENCOUNTER — Ambulatory Visit (INDEPENDENT_AMBULATORY_CARE_PROVIDER_SITE_OTHER): Payer: Medicare Other | Admitting: Physician Assistant

## 2021-11-17 ENCOUNTER — Encounter: Payer: Self-pay | Admitting: Physician Assistant

## 2021-11-17 ENCOUNTER — Telehealth: Payer: Self-pay | Admitting: Internal Medicine

## 2021-11-17 VITALS — BP 112/68 | HR 86 | Ht 72.0 in | Wt 215.0 lb

## 2021-11-17 DIAGNOSIS — I5032 Chronic diastolic (congestive) heart failure: Secondary | ICD-10-CM

## 2021-11-17 DIAGNOSIS — N183 Chronic kidney disease, stage 3 unspecified: Secondary | ICD-10-CM | POA: Diagnosis not present

## 2021-11-17 DIAGNOSIS — I4891 Unspecified atrial fibrillation: Secondary | ICD-10-CM | POA: Diagnosis not present

## 2021-11-17 DIAGNOSIS — I2510 Atherosclerotic heart disease of native coronary artery without angina pectoris: Secondary | ICD-10-CM

## 2021-11-17 DIAGNOSIS — N1832 Chronic kidney disease, stage 3b: Secondary | ICD-10-CM | POA: Diagnosis not present

## 2021-11-17 NOTE — Patient Instructions (Addendum)
Medication Instructions:  ?Your physician recommends that you continue on your current medications as directed. Please refer to the Current Medication list given to you today. ? ? ?Labwork: ?None today ? ?Testing/Procedures: ?14 day Zio monitor ? ? ?ZIO XT- Long Term Monitor Instructions  ? ?Your physician has requested you wear your ZIO patch monitor_____14__days.  ? ?This is a single patch monitor.  [employer] supplies one patch monitor per enrollment.  Additional stickers are not available. ?  ?Please do not apply patch if you will be having a Nuclear Stress Test, Echocardiogram, Cardiac CT, MRI, or Chest Xray during the time frame you would be wearing the monitor. The patch cannot be worn during these tests.  You cannot remove and re-apply the ZIO XT patch monitor. ?  ?Your ZIO patch monitor will be sent USPS Priority mail from [facility] directly to your home address. The monitor may also be mailed to a PO BOX if home delivery is not available.   It may take 3-5 days to receive your monitor after you have been enrolled. ?  ?Once you have received you monitor, please review enclosed instructions.  Your monitor has already been registered assigning a specific monitor serial # to you. ?  ?    ?Do not shower for the first 24 hours.  You may shower after the first 24 hours. ?  ?Press button if you feel a symptom. You will hear a small click.  Record Date, Time and Symptom in the Patient Log Book. ?  ?When you are ready to remove patch, follow instructions on last 2 pages of Patient Log Book.  Stick patch monitor onto last page of Patient Log Book. ?  ?Place Patient Log Book in [facility] box.  Use locking tab on box and tape box closed securely.  The [employer] has [employer] on it.  Please place in mailbox as soon as possible.  Your physician should have your test results approximately 7 days after the monitor has been mailed back to [facility]. ?  ?Call [facility] at  [phone] if you have questions regarding your ZIO XT patch monitor.  Call them immediately if you see an orange light blinking on your monitor. ?  ?If your monitor falls off in less than 4 days contact our Monitor department at [phone].  If your monitor becomes loose or falls off after 4 days call [employer] at [phone] for suggestions on securing your monitor.  ? ? ?Follow-Up: ?6 months ? ?Any Other Special Instructions Will Be Listed Below (If Applicable). ? ?If you need a refill on your cardiac medications before your next appointment, please call your pharmacy. ? ? ?Two Gram Sodium Diet 2000 mg ? ?What is Sodium? Sodium is a mineral found naturally in many foods. The most significant source of sodium in the diet is table salt, which is about 40% sodium.  Processed, convenience, and preserved foods also contain a large amount of sodium.  The body needs only 500 mg of sodium daily to function,  A normal diet provides more than enough sodium even if you do not use salt. ? ?Why Limit Sodium? A build up of sodium in the body can cause thirst, increased blood pressure, shortness of breath, and water retention.  Decreasing sodium in the diet can reduce edema and risk of heart attack or stroke associated with high blood pressure.  Keep in mind that there are many other factors involved in these health problems.  Heredity, obesity,  lack of exercise, cigarette smoking, stress and what you eat all play a role. ? ?General Guidelines: ?Do not add salt at the table or in cooking.  One teaspoon of salt contains over 2 grams of sodium. ?Read food labels ?Avoid processed and convenience foods ?Ask your dietitian before eating any foods not dicussed in the menu planning guidelines ?Consult your physician if you wish to use a salt substitute or a sodium containing medication such as antacids.  Limit milk and milk products to 16 oz (2 cups) per day. ? ?Shopping Hints: ?READ LABELS!! "Dietetic" does not necessarily  mean low sodium. ?Salt and other sodium ingredients are often added to foods during processing. ? ? ? ?Menu Planning Guidelines ?Food Group Choose More Often Avoid  ?Beverages (see also the milk group All fruit juices, low-sodium, salt-free vegetables juices, low-sodium carbonated beverages Regular vegetable or tomato juices, commercially softened water used for drinking or cooking  ?Breads and Cereals Enriched white, wheat, rye and pumpernickel bread, hard rolls and dinner rolls; muffins, cornbread and waffles; most dry cereals, cooked cereal without added salt; unsalted crackers and breadsticks; low sodium or homemade bread crumbs Bread, rolls and crackers with salted tops; quick breads; instant hot cereals; pancakes; commercial bread stuffing; self-rising flower and biscuit mixes; regular bread crumbs or cracker crumbs  ?Desserts and Sweets Desserts and sweets mad with mild should be within allowance Instant pudding mixes and cake mixes  ?Fats Butter or margarine; vegetable oils; unsalted salad dressings, regular salad dressings limited to 1 Tbs; light, sour and heavy cream Regular salad dressings containing bacon fat, bacon bits, and salt pork; snack dips made with instant soup mixes or processed cheese; salted nuts  ?Fruits Most fresh, frozen and canned fruits Fruits processed with salt or sodium-containing ingredient (some dried fruits are processed with sodium sulfites  ? ? ? ? ? ? ?Vegetables Fresh, frozen vegetables and low- sodium canned vegetables Regular canned vegetables, sauerkraut, pickled vegetables, and others prepared in brine; frozen vegetables in sauces; vegetables seasoned with ham, bacon or salt pork  ?Condiments, Sauces, Miscellaneous ? Salt substitute with physician's approval; pepper, herbs, spices; vinegar, lemon or lime juice; hot pepper sauce; garlic powder, onion powder, low sodium soy sauce (1 Tbs.); low sodium condiments (ketchup, chili sauce, mustard) in limited amounts (1 tsp.) fresh  ground horseradish; unsalted tortilla chips, pretzels, potato chips, popcorn, salsa (1/4 cup) Any seasoning made with salt including garlic salt, celery salt, onion salt, and seasoned salt; sea salt, rock salt, kosher salt; meat tenderizers; monosodium glutamate; mustard, regular soy sauce, barbecue, sauce, chili sauce, teriyaki sauce, steak sauce, Worcestershire sauce, and most flavored vinegars; canned gravy and mixes; regular condiments; salted snack foods, olives, picles, relish, horseradish sauce, catsup  ? ?Food preparation: Try these seasonings ?Meats:    ?Pork Sage, onion Serve with applesauce  ?Chicken Poultry seasoning, thyme, parsley Serve with cranberry sauce  ?Lamb Curry powder, rosemary, garlic, thyme Serve with mint sauce or jelly  ?Veal Marjoram, basil Serve with current jelly, cranberry sauce  ?Beef Pepper, bay leaf Serve with dry mustard, unsalted chive butter  ?Fish [place] leaf, dill Serve with unsalted lemon butter, unsalted parsley butter  ?Vegetables:    ?Asparagus Lemon juice   ?Broccoli Lemon juice   ?Carrots Mustard dressing parsley, mint, nutmeg, glazed with unsalted butter and sugar   ?Green beans Marjoram, lemon juice, nutmeg,dill seed   ?Tomatoes Basil, marjoram, onion   ?Spice /blend for "Salt Shaker" 4 tsp ground thyme ?1 tsp ground sage 3 tsp  ground rosemary ?4 tsp ground marjoram  ? ?Test your knowledge ?A product that says "Salt Free" may still contain sodium. True or False ?Garlic Powder and Hot Pepper Sauce an be used as alternative seasonings.True or False ?Processed foods have more sodium than fresh foods.  True or False ?Canned Vegetables have less sodium than froze True or False ? ? ?WAYS TO DECREASE YOUR SODIUM INTAKE ?Avoid the use of added salt in cooking and at the table.  Table salt (and other prepared seasonings which contain salt) is probably one of the greatest sources of sodium in the diet.  Unsalted foods can gain flavor from the sweet, sour, and butter taste sensations  of herbs and spices.  Instead of using salt for seasoning, try the following seasonings with the foods listed.  Remember: how you use them to enhance natural food flavors is limited only by your creativity.Marland KitchenMarland Kitchen ?A

## 2021-11-17 NOTE — Telephone Encounter (Signed)
PERCERT: ? ?LONG TERM MONITOR  ?

## 2021-11-18 ENCOUNTER — Encounter: Payer: Self-pay | Admitting: Gastroenterology

## 2021-11-18 ENCOUNTER — Ambulatory Visit (INDEPENDENT_AMBULATORY_CARE_PROVIDER_SITE_OTHER): Payer: Medicare Other | Admitting: Gastroenterology

## 2021-11-18 VITALS — BP 124/82 | HR 69 | Temp 97.7°F | Ht 72.0 in | Wt 215.4 lb

## 2021-11-18 DIAGNOSIS — D649 Anemia, unspecified: Secondary | ICD-10-CM | POA: Diagnosis not present

## 2021-11-18 DIAGNOSIS — K5900 Constipation, unspecified: Secondary | ICD-10-CM

## 2021-11-18 DIAGNOSIS — I2510 Atherosclerotic heart disease of native coronary artery without angina pectoris: Secondary | ICD-10-CM

## 2021-11-18 NOTE — Progress Notes (Signed)
? ? ? ?GI Office Note   ? ?Referring Provider: Kathyrn Drown, MD ?Primary Care Physician:  Kathyrn Drown, MD  ?Primary Gastroenterologist: Garfield Cornea, MD ? ? ?Chief Complaint  ? ?Chief Complaint  ?Patient presents with  ? Follow-up  ?  No current issues to discuss  ? ? ?History of Present Illness  ? ?Jerry Burns is a 88 y.o. male presenting today for follow-up.  Last seen April 2022.  Patient has multiple comorbidities including prior history of multifocal pneumonia, diastolic CHF, CAD, CVA, type 2 diabetes, hypertension, chronic kidney disease stage IIIa, chronic anemia followed by hematology with last iron infusions 2021, previous DVT with presumed PE in February 2020 treated with Eliquis.  From a GI standpoint he also has a history of constipation, anemia, H. pylori which was eradicated. ? ?Patient without complaints today. Appetite fair. No N/V. No heartburn or dysphagia. Some LE swelling. BM daily. No melena, brbpr. Takes Equate fiber daily. Equate Miralax once per week.  ? ?H/o anemia. Take B12 three times per week. Takes ferrous sulfate '325mg'$  QOD. Last iron infusion in 2021. He reports his last Hgb 12.9 yesterday with Nephrologist.   ? ?Previous work-up: ?  ?He had a CT angio chest on Nov 26, 2019.  He had multifocal pneumonia, tiny right pleural effusion, asbestos related pleural disease, new small pericardial effusion. 2-3 right middle lobe nodule requiring one year follow up. No PE. CT A/P with contrast same day showed generalized fat stranding ruq appears centered on the duodenum suspicious for duodenitis, nondistended gb with suggestion of mild wall thickening and pericholecystic edema may be reactive related to duodenal inflammation. Small amount of upper abdomen ascites. Bilateral inguinal hernias contain ascites. Mild bladder wall thickening can be seen with UTI. Liver measuring 20cm.  ?  ?Abd u/s 11/30/19 with decompressed gb, nonspecific gb wall thickening. Small volume ascites.  ?  ?Last  EGD and colonoscopy September 2019 showed rather severe ulcerative reflux esophagitis, H. pylori gastritis.  Colonic diverticulosis, single tubular adenoma.  No plans for future colonoscopy for screening/surveillance. ? ? ? ?Medications  ? ?Current Outpatient Medications  ?Medication Sig Dispense Refill  ? Ascorbic Acid (VITAMIN C) 1000 MG tablet Take 1,000 mg by mouth daily.     ? atorvastatin (LIPITOR) 80 MG tablet 1 qd 90 tablet 2  ? B-D UF III MINI PEN NEEDLES 31G X 5 MM MISC SMARTSIG:1 Each SUB-Q Twice Daily    ? carvedilol (COREG) 6.25 MG tablet TAKE (1) TABLET BY MOUTHTTWICE DAILY.    ? COMBIGAN 0.2-0.5 % ophthalmic solution Place 1 drop into the left eye 2 (two) times daily.   2  ? Cyanocobalamin (B-12) 2500 MCG TABS Take 1 tablet by mouth 2 (two) times a week. Three times a week on Monday Wednesday and Friday    ? Emollient (CERAVE) CREA Apply 1 application topically daily at 6 (six) AM.    ? empagliflozin (JARDIANCE) 10 MG TABS tablet Take 10 mg by mouth daily.    ? erythromycin ophthalmic ointment Place 1 application into the left eye 3 (three) times daily as needed (infection).     ? ferrous sulfate 325 (65 FE) MG EC tablet Take 325 mg by mouth every other day.    ? Flaxseed, Linseed, (FLAXSEED OIL PO) Take 1 capsule by mouth daily.     ? glucose blood (CONTOUR NEXT TEST) test strip USE 1 STRIP TO CHECK GLUCOSE THREE TIMES DAILY dx code E11.22, N18.31, Z79.4 300 each 0  ?  insulin glargine-yfgn (SEMGLEE, YFGN,) 100 UNIT/ML Pen INJECT 24-25 UNITS SUBCUTANEOUSLY ONCE DAILY AT  10:00  PM 15 mL 1  ? insulin lispro (HUMALOG KWIKPEN) 100 UNIT/ML KwikPen Inject twice daily per following: Breakfast and supper <85 none; 85-110 3 units; 111-160 5 units; 161-220 7 units; 221 or higher 9 units 15 mL 5  ? JARDIANCE 25 MG TABS tablet Take 25 mg by mouth daily.    ? Lactobacillus (PROBIOTIC ACIDOPHILUS) CAPS Take 1 capsule by mouth daily at 6 (six) AM.    ? LANTUS SOLOSTAR 100 UNIT/ML Solostar Pen SMARTSIG:24-25 Unit(s)  SUB-Q Every Night    ? Multiple Vitamin (MULTIVITAMIN) tablet Take 1 tablet by mouth daily.    ? nitroGLYCERIN (NITROSTAT) 0.4 MG SL tablet PLACE 1 TAB UNDER TONGUE EVERY 5 MIN IF NEEDED FOR CHEST PAIN. MAY USE 3 TIMES.NO RELIEF CALL 911. 25 tablet 3  ? polyethylene glycol powder (MIRALAX) 17 GM/SCOOP powder Start by taking 1/2 capful mixed in 4 ounces of fluid daily. You can increase to 1 capful daily if needed to maintain easy BMs. 527 g 5  ? Polyvinyl Alcohol-Povidone (REFRESH OP) Place 1 drop into both ears 2 (two) times daily as needed (dryness).    ? tamsulosin (FLOMAX) 0.4 MG CAPS capsule Take 2 capsules po at bedtime. if this causes drowsiness or dizziness to reduce it to just 1/day 180 capsule 3  ? torsemide (DEMADEX) 20 MG tablet TAKE 1 TO 2 TABLETS BY MOUTH EACH MORNING AS DIRECTED FOR SWELLING IN THE LEGS. 60 tablet 6  ? Turmeric 500 MG CAPS Take 500 mg by mouth daily.    ? VELTASSA 8.4 g packet Take 1 packet by mouth daily.    ? ?No current facility-administered medications for this visit.  ? ? ?Allergies  ? ?Allergies as of 11/18/2021 - Review Complete 11/18/2021  ?Allergen Reaction Noted  ? Adhesive [tape]  05/11/2018  ? Sulfa antibiotics Other (See Comments) 06/04/2015  ? Latex Other (See Comments) 06/29/2012  ? ?  ?Past Medical History  ? ?Past Medical History:  ?Diagnosis Date  ? Anginal pain ([place])   ? Aortic atherosclerosis ([place]) 10/06/2020  ? Seen on CAT scan 2021  ? Arthritis   ? Bilateral knee  ? CHF (congestive heart failure) ([place])   ? Coronary artery disease   ? 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 DVT, lower extremity, distal, acute ([place]) 09/09/2018  ? Hyperlipidemia   ? Hypertension   ? Progressive angina ([place]) 03/2015  ? a. s/p DES to proximal LAD and balloon angioplasty to distal LAD in 2016 b. patent stent by repeat cath in 01/2017 with mild residual CAD)  ? Pulmonary nodule 10/06/2020  ? Seen on CAT scan 2020 1 repeat CAT scan in 2022  ? Stroke [facility])   ? ? ?Past Surgical History   ? ?Past Surgical History:  ?Procedure Laterality Date  ? APPENDECTOMY    ? CARDIAC CATHETERIZATION N/A 04/12/2015  ? Procedure: Left Heart Cath and Coronary Angiography;  Surgeon: Sherren Mocha, MD; pLAD 75>0% w/ 3.0x12 mm Resolute DES, dLAD 80>0% w/ Angiosculpt scoring balloon, CFX luminal irreg, RCA mild dz, EF 55-65%   ? COLONOSCOPY  2011  ? RMR: 1. Normal rectum 2. Left sided diverticula , 2 ascending colon polyps, status post snare polypectomy. Remainder of colonic mucosa appeared unremarkable.   ? COLONOSCOPY N/A 05/13/2016  ? Procedure: COLONOSCOPY;  Surgeon: Daneil Dolin, MD;  [facility];  Service: Endoscopy;  Laterality: N/A;  7:30 AM  ? COLONOSCOPY N/A 05/18/2018  ? Rourk: Diverticulosis, single tubular adenoma removed.  No plans for future screening or surveillance colonoscopy due to age.  ? CORONARY ANGIOGRAPHY N/A 02/04/2017  ? Procedure: CORONARY ANGIOGRAPHY (CATH LAB);  Surgeon: Troy Sine, MD;  Location: [place] CV LAB;  Service: Cardiovascular;  Laterality: N/A;  ? CORONARY ANGIOPLASTY WITH STENT PLACEMENT  08/22/2014  ? PTCA/DES X mLAD with 2.5 x 16 mm Promus Premier DES by Dr Julianne Handler  ? ESOPHAGOGASTRODUODENOSCOPY N/A 05/18/2018  ? Rourk: Ulcerative reflux esophagitis, hiatal hernia, erosive gastropathy with H. pylori on biopsy, also in the setting of NSAID use.  ? FOOT SURGERY Right   ? LEFT HEART CATHETERIZATION WITH CORONARY ANGIOGRAM N/A 08/22/2014  ? Procedure: LEFT HEART CATHETERIZATION WITH CORONARY ANGIOGRAM;  Surgeon: Burnell Blanks, MD;  Location: [facility] CATH LAB;  Service: Cardiovascular;  Laterality: N/A;  ? PERCUTANEOUS CORONARY STENT INTERVENTION (PCI-S)  08/22/2014  ? Procedure: PERCUTANEOUS CORONARY STENT INTERVENTION (PCI-S);  Surgeon: Burnell Blanks, MD;   ? SHOULDER SURGERY    ? TONSILLECTOMY    ? ? ?Past Family History  ? ?Family History  ?Problem Relation Age of Onset  ? Diabetes Other   ? Diabetes Brother   ? Colon cancer Maternal Uncle   ?  Skin cancer Mother   ? Diabetes Father   ? Hypertension Sister   ? Skin cancer Sister   ? ? ?Past Social History  ? ?Social History  ? ?Socioeconomic History  ? Marital status: Married  ?  Spouse name:

## 2021-11-18 NOTE — Patient Instructions (Addendum)
Continue daily fiber supplement and miralax as needed.  ?Continue to follow your anemia (hemoglobin) with your nephrologist, hematologist. If decline in hemoglobin, please let us know.  ?We will send out a letter in one year for follow up appointment. If you have any questions or concerns in the meantime, please call us.  ?

## 2021-11-18 NOTE — Progress Notes (Signed)
Opened in error

## 2021-11-24 DIAGNOSIS — I10 Essential (primary) hypertension: Secondary | ICD-10-CM | POA: Diagnosis not present

## 2021-11-24 DIAGNOSIS — Z794 Long term (current) use of insulin: Secondary | ICD-10-CM | POA: Diagnosis not present

## 2021-11-24 DIAGNOSIS — N1832 Chronic kidney disease, stage 3b: Secondary | ICD-10-CM | POA: Diagnosis not present

## 2021-11-24 DIAGNOSIS — E1122 Type 2 diabetes mellitus with diabetic chronic kidney disease: Secondary | ICD-10-CM | POA: Diagnosis not present

## 2021-11-25 LAB — BASIC METABOLIC PANEL
BUN/Creatinine Ratio: 26 — ABNORMAL HIGH (ref 10–24)
BUN: 40 mg/dL — ABNORMAL HIGH (ref 8–27)
CO2: 22 mmol/L (ref 20–29)
Calcium: 9.8 mg/dL (ref 8.6–10.2)
Chloride: 107 mmol/L — ABNORMAL HIGH (ref 96–106)
Creatinine, Ser: 1.54 mg/dL — ABNORMAL HIGH (ref 0.76–1.27)
Glucose: 93 mg/dL (ref 70–99)
Potassium: 4.8 mmol/L (ref 3.5–5.2)
Sodium: 143 mmol/L (ref 134–144)
eGFR: 44 mL/min/{1.73_m2} — ABNORMAL LOW (ref >59)

## 2021-11-25 LAB — HEMOGLOBIN A1C
Est. average glucose Bld gHb Est-mCnc: 171 mg/dL
Hgb A1c MFr Bld: 7.6 % — ABNORMAL HIGH (ref 4.8–5.6)

## 2021-11-27 DIAGNOSIS — N1832 Chronic kidney disease, stage 3b: Secondary | ICD-10-CM | POA: Diagnosis not present

## 2021-11-27 DIAGNOSIS — E1122 Type 2 diabetes mellitus with diabetic chronic kidney disease: Secondary | ICD-10-CM | POA: Diagnosis not present

## 2021-11-27 DIAGNOSIS — E875 Hyperkalemia: Secondary | ICD-10-CM | POA: Diagnosis not present

## 2021-11-27 DIAGNOSIS — I129 Hypertensive chronic kidney disease with stage 1 through stage 4 chronic kidney disease, or unspecified chronic kidney disease: Secondary | ICD-10-CM | POA: Diagnosis not present

## 2021-11-27 DIAGNOSIS — N400 Enlarged prostate without lower urinary tract symptoms: Secondary | ICD-10-CM | POA: Diagnosis not present

## 2021-11-29 ENCOUNTER — Other Ambulatory Visit: Payer: Self-pay | Admitting: Family Medicine

## 2021-11-29 ENCOUNTER — Encounter: Payer: Self-pay | Admitting: Gastroenterology

## 2021-11-29 DIAGNOSIS — Z794 Long term (current) use of insulin: Secondary | ICD-10-CM

## 2021-11-29 DIAGNOSIS — N1831 Chronic kidney disease, stage 3a: Secondary | ICD-10-CM

## 2021-12-03 ENCOUNTER — Ambulatory Visit (INDEPENDENT_AMBULATORY_CARE_PROVIDER_SITE_OTHER): Payer: Medicare Other | Admitting: Family Medicine

## 2021-12-03 VITALS — BP 129/80 | HR 68 | Temp 97.2°F | Ht 72.0 in | Wt 216.4 lb

## 2021-12-03 DIAGNOSIS — I2510 Atherosclerotic heart disease of native coronary artery without angina pectoris: Secondary | ICD-10-CM

## 2021-12-03 DIAGNOSIS — E1122 Type 2 diabetes mellitus with diabetic chronic kidney disease: Secondary | ICD-10-CM

## 2021-12-03 DIAGNOSIS — N184 Chronic kidney disease, stage 4 (severe): Secondary | ICD-10-CM

## 2021-12-03 DIAGNOSIS — R600 Localized edema: Secondary | ICD-10-CM | POA: Diagnosis not present

## 2021-12-03 MED ORDER — KETOCONAZOLE 2 % EX CREA: 1.0000 "application " | TOPICAL_CREAM | Freq: Two times a day (BID) | CUTANEOUS | 4 refills | Status: DC

## 2021-12-03 NOTE — Patient Instructions (Addendum)
Hi [place] ? ?In regards to the torsemide 20 mg tablet ?You may take 1 on Monday mornings, Wednesday morning, Friday morning, and Saturday ?Recheck your metabolic 7 in approximately 2 weeks ? ?You may set up an office visit to discuss end-of-life issues at your convenience ? ?Regular office visit in 3 months ? ?Ketoconazole cream was sent into the pharmacy-[facility]-apply thin amount twice a day to the rash on your feet as needed ? ?We will look into the continuous glucose monitoring and get back with you soon ? ?Should you have any additional questions or problems please let us know ? ?TakeCare-Dr. Nicki Reaper ? ? ? ?

## 2021-12-03 NOTE — Progress Notes (Signed)
? ?  Subjective:  ? ? Patient ID: Jerry Burns, male    DOB: 31-Jul-1933, 88 y.o.   MRN: [id number] ? ?HPI ? ?Patient here for 3 month follow up. ? ?Cardiology wants to know if pt's torsemide can be increased because he has fluid around his heart. ? ?A1C at home was 7.5. Wants to know if he has the correct meter or does he need something different ? ?Rash on feet and ankles. ? ?Review of Systems ? ?   ?Objective:  ? Physical Exam ? ?General-in no acute distress ?Eyes-no discharge ?Lungs-respiratory rate normal, CTA ?CV-no murmurs,RRR ?Extremities skin warm dry mild edema in the lower legs ?Neuro grossly normal ?Behavior normal, alert ? ? ? ?   ?Assessment & Plan:  ? ?Somewhat challenging issues ?Patient with pedal edema significant swelling in the ankles and feet ?Adjust torsemide to 1 on Monday Wednesday Friday and Saturdays ? ?Patient on multiple shots of insulin per day would benefit from a continuous glucose monitor. ? ?Patient checks his sugars multiple times per day because of fluctuating sugars and is on 4 shots of insulin we will look into a continuous glucose monitor ? ?Patient is thinking about end-of-life issues and would like to schedule an office visit to discuss these we will be doing that in the near future ? ?Diabetes fair control but we are not trying to get A1c below 7 with his age want to try to avoid low sugar spells ? ?We will follow-up again in 3 months time lab work before that visit ? ?

## 2021-12-04 NOTE — Progress Notes (Signed)
With the CGM, we put the order in on the [facility] website which is [employer]. We put in the information such as insurance and demographics. After that, the people at [facility] will do the rest. We do get updates about the order and we can email/text/call Desiree Hane if need be.  ?

## 2021-12-05 DIAGNOSIS — I4891 Unspecified atrial fibrillation: Secondary | ICD-10-CM | POA: Diagnosis not present

## 2021-12-16 ENCOUNTER — Telehealth: Payer: Self-pay | Admitting: *Deleted

## 2021-12-16 DIAGNOSIS — N184 Chronic kidney disease, stage 4 (severe): Secondary | ICD-10-CM | POA: Diagnosis not present

## 2021-12-16 DIAGNOSIS — I4891 Unspecified atrial fibrillation: Secondary | ICD-10-CM

## 2021-12-16 DIAGNOSIS — I10 Essential (primary) hypertension: Secondary | ICD-10-CM

## 2021-12-16 DIAGNOSIS — R600 Localized edema: Secondary | ICD-10-CM | POA: Diagnosis not present

## 2021-12-16 DIAGNOSIS — E1122 Type 2 diabetes mellitus with diabetic chronic kidney disease: Secondary | ICD-10-CM | POA: Diagnosis not present

## 2021-12-16 MED ORDER — APIXABAN 2.5 MG PO TABS: 2.5000 mg | ORAL_TABLET | Freq: Two times a day (BID) | ORAL | 11 refills | Status: DC

## 2021-12-16 NOTE — Telephone Encounter (Signed)
-----   Message from Imogene Burn, PA-C sent at 12/16/2021  1:57 PM EDT ----- Monitor show a rhythm called atrial fibrillation which puts him at risk of stroke. Crt 1.54 and age >60 so we will start eliquis 2.5 mg bid. I spoke with patient and he understands and is grateful for the call. He denies palpitations. Also had PVC's 8.9%. because he is asymptomatic will hold off on increasing coreg and have him f/u with Dr. Harl Bowie next available preferably in the next 4-6 weeks. Repeat bmet and cbc in 2 weeks thanks

## 2021-12-16 NOTE — Addendum Note (Signed)
Addended by: Levonne Hubert on: 12/16/2021 02:34 PM   Modules accepted: Orders

## 2021-12-16 NOTE — Telephone Encounter (Signed)
Pt walked in office today requesting results of long term monitor. Please advise.

## 2021-12-16 NOTE — Telephone Encounter (Signed)
error 

## 2021-12-16 NOTE — Telephone Encounter (Signed)
Pt notified of labs in 2 weeks. Will place order for [facility] at pt's request.

## 2021-12-17 LAB — BASIC METABOLIC PANEL
BUN/Creatinine Ratio: 21 (ref 10–24)
BUN: 35 mg/dL — ABNORMAL HIGH (ref 8–27)
CO2: 24 mmol/L (ref 20–29)
Calcium: 9.8 mg/dL (ref 8.6–10.2)
Chloride: 100 mmol/L (ref 96–106)
Creatinine, Ser: 1.69 mg/dL — ABNORMAL HIGH (ref 0.76–1.27)
Glucose: 128 mg/dL — ABNORMAL HIGH (ref 70–99)
Potassium: 5.3 mmol/L — ABNORMAL HIGH (ref 3.5–5.2)
Sodium: 139 mmol/L (ref 134–144)
eGFR: 39 mL/min/{1.73_m2} — ABNORMAL LOW (ref >59)

## 2021-12-18 ENCOUNTER — Other Ambulatory Visit: Payer: Self-pay | Admitting: Family Medicine

## 2021-12-18 DIAGNOSIS — E875 Hyperkalemia: Secondary | ICD-10-CM

## 2021-12-23 ENCOUNTER — Other Ambulatory Visit: Payer: Self-pay | Admitting: Family Medicine

## 2021-12-23 NOTE — Telephone Encounter (Signed)
6 months on each please

## 2021-12-28 ENCOUNTER — Encounter: Payer: Self-pay | Admitting: Family Medicine

## 2021-12-28 DIAGNOSIS — I4891 Unspecified atrial fibrillation: Secondary | ICD-10-CM

## 2021-12-28 HISTORY — DX: Unspecified atrial fibrillation: I48.91

## 2021-12-31 DIAGNOSIS — I10 Essential (primary) hypertension: Secondary | ICD-10-CM | POA: Diagnosis not present

## 2021-12-31 DIAGNOSIS — I4891 Unspecified atrial fibrillation: Secondary | ICD-10-CM | POA: Diagnosis not present

## 2022-01-01 LAB — BASIC METABOLIC PANEL
BUN/Creatinine Ratio: 25 — ABNORMAL HIGH (ref 10–24)
BUN: 41 mg/dL — ABNORMAL HIGH (ref 8–27)
CO2: 24 mmol/L (ref 20–29)
Calcium: 9.9 mg/dL (ref 8.6–10.2)
Chloride: 102 mmol/L (ref 96–106)
Creatinine, Ser: 1.66 mg/dL — ABNORMAL HIGH (ref 0.76–1.27)
Glucose: 95 mg/dL (ref 70–99)
Potassium: 4.7 mmol/L (ref 3.5–5.2)
Sodium: 140 mmol/L (ref 134–144)
eGFR: 40 mL/min/{1.73_m2} — ABNORMAL LOW (ref >59)

## 2022-01-01 LAB — CBC
Hematocrit: 39.4 % (ref 37.5–51.0)
Hemoglobin: 12.9 g/dL — ABNORMAL LOW (ref 13.0–17.7)
MCH: 30.9 pg (ref 26.6–33.0)
MCHC: 32.7 g/dL (ref 31.5–35.7)
MCV: 95 fL (ref 79–97)
Platelets: 145 10*3/uL — ABNORMAL LOW (ref 150–450)
RBC: 4.17 x10E6/uL (ref 4.14–5.80)
RDW: 12.4 % (ref 11.6–15.4)
WBC: 5.1 10*3/uL (ref 3.4–10.8)

## 2022-01-19 ENCOUNTER — Other Ambulatory Visit: Payer: Self-pay

## 2022-01-19 ENCOUNTER — Ambulatory Visit (INDEPENDENT_AMBULATORY_CARE_PROVIDER_SITE_OTHER): Payer: Medicare Other | Admitting: Family Medicine

## 2022-01-19 ENCOUNTER — Encounter: Payer: Self-pay | Admitting: Family Medicine

## 2022-01-19 VITALS — BP 125/72 | HR 95 | Temp 97.9°F | Wt 218.2 lb

## 2022-01-19 DIAGNOSIS — I2510 Atherosclerotic heart disease of native coronary artery without angina pectoris: Secondary | ICD-10-CM

## 2022-01-19 DIAGNOSIS — E1169 Type 2 diabetes mellitus with other specified complication: Secondary | ICD-10-CM

## 2022-01-19 DIAGNOSIS — E785 Hyperlipidemia, unspecified: Secondary | ICD-10-CM

## 2022-01-19 DIAGNOSIS — L309 Dermatitis, unspecified: Secondary | ICD-10-CM

## 2022-01-19 DIAGNOSIS — Z7189 Other specified counseling: Secondary | ICD-10-CM | POA: Diagnosis not present

## 2022-01-19 DIAGNOSIS — N184 Chronic kidney disease, stage 4 (severe): Secondary | ICD-10-CM

## 2022-01-19 DIAGNOSIS — E1122 Type 2 diabetes mellitus with diabetic chronic kidney disease: Secondary | ICD-10-CM | POA: Diagnosis not present

## 2022-01-19 DIAGNOSIS — Z794 Long term (current) use of insulin: Secondary | ICD-10-CM

## 2022-01-19 DIAGNOSIS — I10 Essential (primary) hypertension: Secondary | ICD-10-CM

## 2022-01-19 MED ORDER — KETOCONAZOLE 2 % EX CREA: 1.0000 | TOPICAL_CREAM | Freq: Two times a day (BID) | CUTANEOUS | 4 refills | Status: AC

## 2022-01-19 NOTE — Progress Notes (Signed)
   Subjective:    Patient ID: Jerry Burns, male    DOB: 02-19-1934, 88 y.o.   MRN: [id number]  HPI Pt arrives today for end of life meeting with PCP.  We had a good discussion today what he values.  What he finds he wants to live for.  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  But at the same time he realizes he is coming to the end of his years.  He does not want to have anything that would prolong his life unnecessarily.  If he was incapacitated or had a poor prognosis long-term or had dementia he does not want to have any intervention.  Otherwise he is in favor of limited intervention if it can get him back to his decent quality of life cannot do so he would want to discontinue  Pt states pain if both feet have worsened.  He utilizes the medication as we have prescribed.  But he has intermittent burning in the feet with a rash that is getting worse despite the creams  Pt states blood pressure was low yesterday (105/60).  Blood pressure was low yesterday but it is good today  Driving going well his quality of life overall is good he is able to walk around able to do his own housework for the most part and go to the grocery by and fixes food  Buys and fixes food   Review of Systems     Objective:   Physical Exam General-in no acute distress Eyes-no discharge Lungs-respiratory rate normal, CTA CV-no murmurs,RRR Extremities skin warm dry mild edema Neuro grossly normal Behavior normal, alert        Assessment & Plan:  1. Hyperlipidemia associated with type 2 diabetes mellitus (HCC) Continue current medication watching diet  2. CKD stage 4 due to type 2 diabetes mellitus (HCC) Recent GFR stable no other particular findings on recent lab work potassium looks good  3. Foot dermatitis Referral to dermatology Dr. Margo Aye  4. Counseling regarding advance directives and goals of care Significant time spent discussing advance care directives  information given to the patient he will bring in his current advance care directive for me to review and then we can file that if he makes any revisions with the standard [facility] form he will fill that in and bring that to [place]  35 minutes spent with patient today The patient also will bring a copy of his advance care planning to review over I gave him a copy of the [facility] Department of Health guidelines he may need to have some mild adjustment depending on what his shows

## 2022-01-21 ENCOUNTER — Ambulatory Visit: Payer: Medicare Other | Admitting: Student

## 2022-01-28 ENCOUNTER — Other Ambulatory Visit: Payer: Self-pay | Admitting: Student

## 2022-01-29 DIAGNOSIS — L258 Unspecified contact dermatitis due to other agents: Secondary | ICD-10-CM | POA: Diagnosis not present

## 2022-01-29 DIAGNOSIS — B353 Tinea pedis: Secondary | ICD-10-CM | POA: Diagnosis not present

## 2022-02-25 ENCOUNTER — Ambulatory Visit: Payer: Medicare Other | Admitting: Cardiology

## 2022-03-02 DIAGNOSIS — N1832 Chronic kidney disease, stage 3b: Secondary | ICD-10-CM | POA: Diagnosis not present

## 2022-03-02 DIAGNOSIS — E1169 Type 2 diabetes mellitus with other specified complication: Secondary | ICD-10-CM | POA: Diagnosis not present

## 2022-03-02 DIAGNOSIS — Z794 Long term (current) use of insulin: Secondary | ICD-10-CM | POA: Diagnosis not present

## 2022-03-02 DIAGNOSIS — I10 Essential (primary) hypertension: Secondary | ICD-10-CM | POA: Diagnosis not present

## 2022-03-02 DIAGNOSIS — E785 Hyperlipidemia, unspecified: Secondary | ICD-10-CM | POA: Diagnosis not present

## 2022-03-02 DIAGNOSIS — E1122 Type 2 diabetes mellitus with diabetic chronic kidney disease: Secondary | ICD-10-CM | POA: Diagnosis not present

## 2022-03-02 DIAGNOSIS — N184 Chronic kidney disease, stage 4 (severe): Secondary | ICD-10-CM | POA: Diagnosis not present

## 2022-03-03 LAB — BASIC METABOLIC PANEL
BUN/Creatinine Ratio: 23 (ref 10–24)
BUN: 38 mg/dL — ABNORMAL HIGH (ref 8–27)
CO2: 24 mmol/L (ref 20–29)
Calcium: 10 mg/dL (ref 8.6–10.2)
Chloride: 101 mmol/L (ref 96–106)
Creatinine, Ser: 1.68 mg/dL — ABNORMAL HIGH (ref 0.76–1.27)
Glucose: 128 mg/dL — ABNORMAL HIGH (ref 70–99)
Potassium: 5.1 mmol/L (ref 3.5–5.2)
Sodium: 141 mmol/L (ref 134–144)
eGFR: 39 mL/min/{1.73_m2} — ABNORMAL LOW (ref >59)

## 2022-03-03 LAB — LIPID PANEL
Chol/HDL Ratio: 2.8 ratio (ref 0.0–5.0)
Cholesterol, Total: 132 mg/dL (ref 100–199)
HDL: 48 mg/dL (ref >39)
LDL Chol Calc (NIH): 65 mg/dL (ref 0–99)
Triglycerides: 104 mg/dL (ref 0–149)
VLDL Cholesterol Cal: 19 mg/dL (ref 5–40)

## 2022-03-03 LAB — HEPATIC FUNCTION PANEL
ALT: 27 IU/L (ref 0–44)
AST: 30 IU/L (ref 0–40)
Albumin: 4.7 g/dL (ref 3.7–4.7)
Alkaline Phosphatase: 238 IU/L — ABNORMAL HIGH (ref 44–121)
Bilirubin Total: 0.9 mg/dL (ref 0.0–1.2)
Bilirubin, Direct: 0.47 mg/dL — ABNORMAL HIGH (ref 0.00–0.40)
Total Protein: 7.4 g/dL (ref 6.0–8.5)

## 2022-03-03 LAB — HEMOGLOBIN A1C
Est. average glucose Bld gHb Est-mCnc: 171 mg/dL
Hgb A1c MFr Bld: 7.6 % — ABNORMAL HIGH (ref 4.8–5.6)

## 2022-03-05 ENCOUNTER — Ambulatory Visit: Payer: Self-pay | Admitting: Family Medicine

## 2022-03-10 ENCOUNTER — Encounter: Payer: Self-pay | Admitting: Family Medicine

## 2022-03-10 ENCOUNTER — Ambulatory Visit (INDEPENDENT_AMBULATORY_CARE_PROVIDER_SITE_OTHER): Payer: Medicare Other | Admitting: Family Medicine

## 2022-03-10 VITALS — BP 112/68 | HR 82 | Temp 97.9°F | Wt 215.0 lb

## 2022-03-10 DIAGNOSIS — E785 Hyperlipidemia, unspecified: Secondary | ICD-10-CM | POA: Diagnosis not present

## 2022-03-10 DIAGNOSIS — R748 Abnormal levels of other serum enzymes: Secondary | ICD-10-CM | POA: Diagnosis not present

## 2022-03-10 DIAGNOSIS — I10 Essential (primary) hypertension: Secondary | ICD-10-CM

## 2022-03-10 DIAGNOSIS — E1169 Type 2 diabetes mellitus with other specified complication: Secondary | ICD-10-CM | POA: Diagnosis not present

## 2022-03-10 DIAGNOSIS — E1122 Type 2 diabetes mellitus with diabetic chronic kidney disease: Secondary | ICD-10-CM | POA: Diagnosis not present

## 2022-03-10 DIAGNOSIS — N184 Chronic kidney disease, stage 4 (severe): Secondary | ICD-10-CM

## 2022-03-10 DIAGNOSIS — I2510 Atherosclerotic heart disease of native coronary artery without angina pectoris: Secondary | ICD-10-CM

## 2022-03-10 NOTE — Progress Notes (Addendum)
   Subjective:    Patient ID: Jerry Burns, male    DOB: [birth year], 88 y.o.   MRN: [id number]  Diabetes He presents for his follow-up diabetic visit. He has type 2 diabetes mellitus. There are no hypoglycemic associated symptoms. There are no diabetic associated symptoms. There are no hypoglycemic complications. Diabetic meal planning: pt states has a "crazy eating schedule" He sees a podiatrist.Eye exam is current.  Pt uses Debrox and now left ear is stopped up from that.   Essential hypertension  Alkaline phosphatase elevation - Plan: US Abdomen Limited RUQ (LIVER/GB)  CKD stage 4 due to type 2 diabetes mellitus ([place])  Hyperlipidemia associated with type 2 diabetes mellitus ([place]) Patient does have type 2 diabetes he is on insulin he takes multiple shots a day his blood sugars are all over the place he would benefit from having a Dexcom for his wellbeing and health  Review of Systems     Objective:   Physical Exam General-in no acute distress Eyes-no discharge Lungs-respiratory rate normal, CTA CV-no murmurs,RRR Extremities skin warm dry no edema Neuro grossly normal Behavior normal, alert  We will reviewed over the lab work today including the trend of increased alkaline phosphatase level.  Patient does have scaling on the feet dermatologist treated him with betamethasone that seems to be helping    Assessment & Plan:  1. Essential hypertension Blood pressure good control continue current measures.  Watch diet closely.  2. Alkaline phosphatase elevation Trend upward of alkaline phosphatase elevation we will look at liver to see if there is any issue with the liver if this comes back normal also recommend GGT - US Abdomen Limited RUQ (LIVER/GB)  3. CKD stage 4 due to type 2 diabetes mellitus ([place]) His kidney function is such to where it is very important for him to watch his diet continuing his medicines and take an adequate amount of liquids but at the same time we  need to watch very closely because of progressively has deterioration this could head toward dialysis but currently right now it is not in that area  4. Hyperlipidemia associated with type 2 diabetes mellitus ([place]) Cholesterol issues continue to take medication watch diet lab work looks good  Patient to follow-up in 3 to 4 months I clearly state the following: With his diabetes it is important to get this under good control I believe a Dexcom monitor would help him greatly  This patient is on insulin.  He takes several shots per day.  He also picks his fingers several times a day to monitor his sugars.  It is necessary to get his diabetes under good control and it is felt that the Dexcom would allow them to monitor his glucose readings closely

## 2022-03-13 ENCOUNTER — Other Ambulatory Visit: Payer: Self-pay | Admitting: *Deleted

## 2022-03-13 DIAGNOSIS — R748 Abnormal levels of other serum enzymes: Secondary | ICD-10-CM

## 2022-03-13 MED ORDER — DEXCOM G6 TRANSMITTER MISC: 0 refills | Status: DC

## 2022-03-13 MED ORDER — DEXCOM G6 SENSOR MISC: 1 refills | Status: DC

## 2022-03-13 MED ORDER — DEXCOM G6 RECEIVER DEVI: 0 refills | Status: DC

## 2022-03-13 NOTE — Progress Notes (Signed)
Blood work ordered in Epic. Patient aware  ?

## 2022-03-13 NOTE — Progress Notes (Signed)
Prescription sent electronically to pharmacy. Patient aware. 

## 2022-03-16 ENCOUNTER — Other Ambulatory Visit: Payer: Self-pay | Admitting: Family Medicine

## 2022-03-19 DIAGNOSIS — B351 Tinea unguium: Secondary | ICD-10-CM | POA: Diagnosis not present

## 2022-03-19 DIAGNOSIS — L308 Other specified dermatitis: Secondary | ICD-10-CM | POA: Diagnosis not present

## 2022-03-19 DIAGNOSIS — B353 Tinea pedis: Secondary | ICD-10-CM | POA: Diagnosis not present

## 2022-03-24 ENCOUNTER — Ambulatory Visit (HOSPITAL_COMMUNITY)
Admission: RE | Admit: 2022-03-24 | Discharge: 2022-03-24 | Disposition: A | Discharge status: Home / Self Care | Payer: Medicare Other | Source: Ambulatory Visit | Attending: Family Medicine | Admitting: Family Medicine

## 2022-03-24 DIAGNOSIS — R945 Abnormal results of liver function studies: Secondary | ICD-10-CM | POA: Diagnosis not present

## 2022-03-24 DIAGNOSIS — K7460 Unspecified cirrhosis of liver: Secondary | ICD-10-CM | POA: Diagnosis not present

## 2022-03-24 DIAGNOSIS — R748 Abnormal levels of other serum enzymes: Secondary | ICD-10-CM | POA: Diagnosis not present

## 2022-03-25 ENCOUNTER — Ambulatory Visit (INDEPENDENT_AMBULATORY_CARE_PROVIDER_SITE_OTHER): Payer: Medicare Other | Admitting: Nurse Practitioner

## 2022-03-25 ENCOUNTER — Encounter: Payer: Self-pay | Admitting: Nurse Practitioner

## 2022-03-25 VITALS — BP 134/74 | HR 67 | Temp 97.5°F | Ht 72.0 in | Wt 215.0 lb

## 2022-03-25 DIAGNOSIS — S90812A Abrasion, left foot, initial encounter: Secondary | ICD-10-CM | POA: Diagnosis not present

## 2022-03-25 DIAGNOSIS — B49 Unspecified mycosis: Secondary | ICD-10-CM | POA: Diagnosis not present

## 2022-03-25 DIAGNOSIS — R748 Abnormal levels of other serum enzymes: Secondary | ICD-10-CM | POA: Diagnosis not present

## 2022-03-25 MED ORDER — MUPIROCIN 2 % EX OINT: 1.0000 | TOPICAL_OINTMENT | Freq: Two times a day (BID) | CUTANEOUS | 0 refills | Status: DC

## 2022-03-25 MED ORDER — CEPHALEXIN 250 MG PO CAPS: 250.0000 mg | ORAL_CAPSULE | Freq: Three times a day (TID) | ORAL | 0 refills | Status: AC

## 2022-03-25 MED ORDER — DOXYCYCLINE HYCLATE 100 MG PO TABS: 100.0000 mg | ORAL_TABLET | Freq: Two times a day (BID) | ORAL | 0 refills | Status: AC

## 2022-03-25 NOTE — Progress Notes (Signed)
Scheduled for 04/01/22

## 2022-03-25 NOTE — Progress Notes (Signed)
   Subjective:    Patient ID: Jerry Burns, male    DOB: 08-Mar-1934, 88 y.o.   MRN: [id number]  HPI  88 year old male patient with history of diabetes and fungal infection to his left foot presents to clinic today with concerns about an abrasion to his left heel.  Patient states that he noticed the area approximately 2 nights ago.  Patient states that the heel is very sore and tender to touch.    Patient denies any drainage coming from the area, fever, body aches, chills.  Review of Systems  Skin:  Positive for wound.       Wound to the back of his left heel  All other systems reviewed and are negative.      Objective:   Physical Exam Constitutional:      General: He is not in acute distress.    Appearance: Normal appearance. He is normal weight. He is not ill-appearing or toxic-appearing.  HENT:     Head: Normocephalic and atraumatic.  Skin:    General: Skin is warm.     Capillary Refill: Capillary refill takes less than 2 seconds.     Comments: Open wound to posterior heel near calcaneus bony prominence.  Mild to moderate redness and swelling.  No purulent drainage noted.  No other drainage noted.  Plantar side of left foot largely covered by thickened yellow to brown skin.  Mild fissures noted  Neurological:     Mental Status: He is alert.  Psychiatric:        Mood and Affect: Mood normal.        Behavior: Behavior normal.        Assessment & Plan:   1. Abrasion of left foot, initial encounter -Due to patient's history of diabetes and current fungal foot infection we will treat patient for infection of the left foot. -We will also get x-ray to rule out osteomyelitis or other complications -Last X5Q 7.6. - mupirocin ointment (BACTROBAN) 2 %; Apply 1 Application topically 2 (two) times daily.  Dispense: 22 g; Refill: 0 - DG Foot Complete Left - doxycycline (VIBRA-TABS) 100 MG tablet; Take 1 tablet (100 mg total) by mouth 2 (two) times daily for 5 days.  Dispense: 10  tablet; Refill: 0 - cephALEXin (KEFLEX) 250 MG capsule; Take 1 capsule (250 mg total) by mouth 3 (three) times daily for 5 days.  Dispense: 15 capsule; Refill: 0 -Last GFR was 39.  Lower dose of Keflex provided to protect kidneys. -Return to clinic in 7 days or sooner if needed  2. Fungal infection -Continue to follow-up with dermatology as scheduled    Note:  This document was prepared using Dragon voice recognition software and may include unintentional dictation errors. Note - This record has been created using [employer].  Chart creation errors have been sought, but may not always  have been located. Such creation errors do not reflect on  the standard of medical care.

## 2022-03-26 LAB — GAMMA GT: GGT: 293 IU/L — ABNORMAL HIGH (ref 0–65)

## 2022-04-01 ENCOUNTER — Ambulatory Visit (INDEPENDENT_AMBULATORY_CARE_PROVIDER_SITE_OTHER): Payer: Medicare Other | Admitting: Family Medicine

## 2022-04-01 VITALS — BP 134/75 | HR 66 | Temp 97.2°F | Ht 72.0 in | Wt 214.0 lb

## 2022-04-01 DIAGNOSIS — I2510 Atherosclerotic heart disease of native coronary artery without angina pectoris: Secondary | ICD-10-CM | POA: Diagnosis not present

## 2022-04-01 DIAGNOSIS — R748 Abnormal levels of other serum enzymes: Secondary | ICD-10-CM

## 2022-04-01 DIAGNOSIS — Z23 Encounter for immunization: Secondary | ICD-10-CM

## 2022-04-01 NOTE — Patient Instructions (Signed)
Get  Senior dose flu shot and RSV at [employer]

## 2022-04-01 NOTE — Progress Notes (Addendum)
   Subjective:    Patient ID: Jerry Burns, male    DOB: 1933/09/09, 88 y.o.   MRN: [id number]  HPI Follow up for l foot abrasion  Lab results : The area on the left foot is doing better.  No sign of infection with it. Recent lab work shows gamma glutamyl ACE elevated which indicates liver involvement.  He denies any abdominal pain no jaundice no abdominal swelling  Review of Systems     Objective:   Physical Exam General-in no acute distress Eyes-no discharge Lungs-respiratory rate normal, CTA CV-no murmurs,RRR Extremities skin warm dry no edema Neuro grossly normal Behavior normal, alert        Assessment & Plan:  Foot abrasion doing much better should heal up without trouble  Diabetes-he is trying to get Dexcom approved but running into some trouble at his pharmacy He does have diabetes which requires several shots of insulin per day 3 short acting 1 long-acting along with checking his sugars 4 times daily He would benefit from having a Dexcom  Elevated alkaline phosphatase-appears to be from the liver will connect with gastroenterology about how to proceed forward with testing of the liver or if they want to manage this

## 2022-04-13 ENCOUNTER — Telehealth: Payer: Self-pay

## 2022-04-13 NOTE — Telephone Encounter (Signed)
Nurses Please inform patient communication was sent to gastroenterology They will need to see him in order to give further opinion Recommend going ahead with referral to set up the appointment  They will see him this fall

## 2022-04-13 NOTE — Telephone Encounter (Signed)
Caller name:Omaree Cori Razor   On DPR? :Yes  Call back number:[phone]  Provider they see: [place]   Reason for call:Pt said she was referred to Dr Gala Romney and has never heard anything and nothing shows for referral?

## 2022-04-13 NOTE — Telephone Encounter (Signed)
Note was sent to Neil Crouch on 04/01/22; please advise. Thank you

## 2022-04-13 NOTE — Telephone Encounter (Signed)
Left message to return call 

## 2022-04-14 NOTE — Telephone Encounter (Signed)
Patient notified

## 2022-04-28 ENCOUNTER — Telehealth: Payer: Self-pay | Admitting: Family Medicine

## 2022-04-28 NOTE — Telephone Encounter (Signed)
Left message for patient to call back and schedule Medicare Annual Wellness Visit (AWV).  Please offer to do virtually or by telephone.  Last AWV: 04/19/2021  Please schedule at any time with [facility]-Nurse Health Advisor.  30 minute appointment for Virtual or phone  45 minute appointment for  Initial virtual/phone  Any questions, please contact me at [phone]

## 2022-05-07 ENCOUNTER — Ambulatory Visit (INDEPENDENT_AMBULATORY_CARE_PROVIDER_SITE_OTHER): Payer: Medicare Other

## 2022-05-07 VITALS — Wt 214.0 lb

## 2022-05-07 DIAGNOSIS — Z0000 Encounter for general adult medical examination without abnormal findings: Secondary | ICD-10-CM | POA: Diagnosis not present

## 2022-05-07 NOTE — Patient Instructions (Signed)
Jerry Burns , Thank you for taking time to come for your Medicare Wellness Visit. I appreciate your ongoing commitment to your health goals. Please review the following plan we discussed and let me know if I can assist you in the future.   Screening recommendations/referrals: Colonoscopy: aged out Recommended yearly ophthalmology/optometry visit for glaucoma screening and checkup Recommended yearly dental visit for hygiene and checkup  Vaccinations: Influenza vaccine: 05/09/21 Pneumococcal vaccine: 01/30/14 Tdap vaccine: 02/12/19 Shingles vaccine: Zostavax 07/27/08   Shingrix 08/28/17, 02/11/18   Covid-19: 08/07/19, 08/26/19, 05/09/20  Advanced directives: yes  Conditions/risks identified: no  Next appointment: Follow up in one year for your annual wellness visit.   Preventive Care 22 Years and Older, Male Preventive care refers to lifestyle choices and visits with your health care provider that can promote health and wellness. What does preventive care include? A yearly physical exam. This is also called an annual well check. Dental exams once or twice a year. Routine eye exams. Ask your health care provider how often you should have your eyes checked. Personal lifestyle choices, including: Daily care of your teeth and gums. Regular physical activity. Eating a healthy diet. Avoiding tobacco and drug use. Limiting alcohol use. Practicing safe sex. Taking low doses of aspirin every day. Taking vitamin and mineral supplements as recommended by your health care provider. What happens during an annual well check? The services and screenings done by your health care provider during your annual well check will depend on your age, overall health, lifestyle risk factors, and family history of disease. Counseling  Your health care provider may ask you questions about your: Alcohol use. Tobacco use. Drug use. Emotional well-being. Home and relationship well-being. Sexual activity. Eating  habits. History of falls. Memory and ability to understand (cognition). Work and work Statistician. Screening  You may have the following tests or measurements: Height, weight, and BMI. Blood pressure. Lipid and cholesterol levels. These may be checked every 5 years, or more frequently if you are over 64 years old. Skin check. Lung cancer screening. You may have this screening every year starting at age 69 if you have a 30-pack-year history of smoking and currently smoke or have quit within the past 15 years. Fecal occult blood test (FOBT) of the stool. You may have this test every year starting at age 71. Flexible sigmoidoscopy or colonoscopy. You may have a sigmoidoscopy every 5 years or a colonoscopy every 10 years starting at age 33. Prostate cancer screening. Recommendations will vary depending on your family history and other risks. Hepatitis C blood test. Hepatitis B blood test. Sexually transmitted disease (STD) testing. Diabetes screening. This is done by checking your blood sugar (glucose) after you have not eaten for a while (fasting). You may have this done every 1-3 years. Abdominal aortic aneurysm (AAA) screening. You may need this if you are a current or former smoker. Osteoporosis. You may be screened starting at age 4 if you are at high risk. Talk with your health care provider about your test results, treatment options, and if necessary, the need for more tests. Vaccines  Your health care provider may recommend certain vaccines, such as: Influenza vaccine. This is recommended every year. Tetanus, diphtheria, and acellular pertussis (Tdap, Td) vaccine. You may need a Td booster every 10 years. Zoster vaccine. You may need this after age 59. Pneumococcal 13-valent conjugate (PCV13) vaccine. One dose is recommended after age 92. Pneumococcal polysaccharide (PPSV23) vaccine. One dose is recommended after age 39. Talk to your  health care provider about which screenings and  vaccines you need and how often you need them. This information is not intended to replace advice given to you by your health care provider. Make sure you discuss any questions you have with your health care provider. Document Released: 08/09/2015 Document Revised: 04/01/2016 Document Reviewed: 05/14/2015 Elsevier Interactive Patient Education  2017 [employer] Prevention in the Home Falls can cause injuries. They can happen to people of all ages. There are many things you can do to make your home safe and to help prevent falls. What can I do on the outside of my home? Regularly fix the edges of walkways and driveways and fix any cracks. Remove anything that might make you trip as you walk through a door, such as a raised step or threshold. Trim any bushes or trees on the path to your home. Use bright outdoor lighting. Clear any walking paths of anything that might make someone trip, such as rocks or tools. Regularly check to see if handrails are loose or broken. Make sure that both sides of any steps have handrails. Any raised decks and porches should have guardrails on the edges. Have any leaves, snow, or ice cleared regularly. Use sand or salt on walking paths during winter. Clean up any spills in your garage right away. This includes oil or grease spills. What can I do in the bathroom? Use night lights. Install grab bars by the toilet and in the tub and shower. Do not use towel bars as grab bars. Use non-skid mats or decals in the tub or shower. If you need to sit down in the shower, use a plastic, non-slip stool. Keep the floor dry. Clean up any water that spills on the floor as soon as it happens. Remove soap buildup in the tub or shower regularly. Attach bath mats securely with double-sided non-slip rug tape. Do not have throw rugs and other things on the floor that can make you trip. What can I do in the bedroom? Use night lights. Make sure that you have a light by your  bed that is easy to reach. Do not use any sheets or blankets that are too big for your bed. They should not hang down onto the floor. Have a firm chair that has side arms. You can use this for support while you get dressed. Do not have throw rugs and other things on the floor that can make you trip. What can I do in the kitchen? Clean up any spills right away. Avoid walking on wet floors. Keep items that you use a lot in easy-to-reach places. If you need to reach something above you, use a strong step stool that has a grab bar. Keep electrical cords out of the way. Do not use floor polish or wax that makes floors slippery. If you must use wax, use non-skid floor wax. Do not have throw rugs and other things on the floor that can make you trip. What can I do with my stairs? Do not leave any items on the stairs. Make sure that there are handrails on both sides of the stairs and use them. Fix handrails that are broken or loose. Make sure that handrails are as long as the stairways. Check any carpeting to make sure that it is firmly attached to the stairs. Fix any carpet that is loose or worn. Avoid having throw rugs at the top or bottom of the stairs. If you do have throw rugs, attach them  to the floor with carpet tape. Make sure that you have a light switch at the top of the stairs and the bottom of the stairs. If you do not have them, ask someone to add them for you. What else can I do to help prevent falls? Wear shoes that: Do not have high heels. Have rubber bottoms. Are comfortable and fit you well. Are closed at the toe. Do not wear sandals. If you use a stepladder: Make sure that it is fully opened. Do not climb a closed stepladder. Make sure that both sides of the stepladder are locked into place. Ask someone to hold it for you, if possible. Clearly mark and make sure that you can see: Any grab bars or handrails. First and last steps. Where the edge of each step is. Use tools that  help you move around (mobility aids) if they are needed. These include: Canes. Walkers. Scooters. Crutches. Turn on the lights when you go into a dark area. Replace any light bulbs as soon as they burn out. Set up your furniture so you have a clear path. Avoid moving your furniture around. If any of your floors are uneven, fix them. If there are any pets around you, be aware of where they are. Review your medicines with your doctor. Some medicines can make you feel dizzy. This can increase your chance of falling. Ask your doctor what other things that you can do to help prevent falls. This information is not intended to replace advice given to you by your health care provider. Make sure you discuss any questions you have with your health care provider. Document Released: 05/09/2009 Document Revised: 12/19/2015 Document Reviewed: 08/17/2014 Elsevier Interactive Patient Education  2017 [employer].

## 2022-05-07 NOTE — Progress Notes (Signed)
Virtual Visit via Telephone Note  I connected with  [name] on 05/07/22 at 10:30 AM EDT by telephone and verified that I am speaking with the correct person using two identifiers.  Location: Patient: home Provider: [facility] Persons participating in the virtual visit: patient/Nurse Health Advisor   I discussed the limitations, risks, security and privacy concerns of performing an evaluation and management service by telephone and the availability of in person appointments. The patient expressed understanding and agreed to proceed.  Interactive audio and video telecommunications were attempted between this nurse and patient, however failed, due to patient having technical difficulties OR patient did not have access to video capability.  We continued and completed visit with audio only.  Some vital signs may be absent or patient reported.   Dionisio David, LPN  Subjective:   DAYRON ODLAND is a 88 y.o. male who presents for Medicare Annual/Subsequent preventive examination.  Review of Systems     Cardiac Risk Factors include: advanced age (>38mn, >>60women);diabetes mellitus;male gender     Objective:    There were no vitals filed for this visit. There is no height or weight on file to calculate BMI.     05/07/2022   10:29 AM 07/16/2021    9:16 AM 04/19/2021    9:17 AM 01/09/2021    8:03 AM 07/05/2020    1:21 PM 03/01/2020    8:48 AM 11/30/2019    5:05 PM  Advanced Directives  Does Patient Have a Medical Advance Directive? Yes Yes Yes Yes Yes Yes Yes  Type of AParamedicof ABufordLiving will HPine GroveLiving will HCorinthLiving will HAquia HarbourLiving will HMarshvilleLiving will HMcRae-HelenaLiving will Living will;Healthcare Power of Attorney  Does patient want to make changes to medical advance directive? No - Patient declined No - Patient declined  No -  Patient declined No - Patient declined No - Patient declined No - Patient declined  Copy of HBelleviewin Chart? Yes - validated most recent copy scanned in chart (See row information) No - copy requested  No - copy requested No - copy requested No - copy requested No - copy requested  Would patient like information on creating a medical advance directive? No - Patient declined Yes (MAU/Ambulatory/Procedural Areas - Information given)     No - Patient declined    Current Medications (verified) Outpatient Encounter Medications as of 05/07/2022  Medication Sig   apixaban (ELIQUIS) 2.5 MG TABS tablet Take 1 tablet (2.5 mg total) by mouth 2 (two) times daily.   Ascorbic Acid (VITAMIN C) 1000 MG tablet Take 1,000 mg by mouth daily.    atorvastatin (LIPITOR) 80 MG tablet 1 qd   augmented betamethasone dipropionate (DIPROLENE-AF) 0.05 % ointment Apply topically 2 (two) times daily as needed.   B-D UF III MINI PEN NEEDLES 31G X 5 MM MISC SMARTSIG:1 Each SUB-Q Twice Daily   BD PEN NEEDLE NANO 2ND GEN 32G X 4 MM MISC USE 1 PEN NEEDLE FIVE TIMES DAILY   betamethasone dipropionate 0.05 % cream Apply topically 2 (two) times daily.   carvedilol (COREG) 6.25 MG tablet TAKE (1) TABLET BY MOUTHTTWICE DAILY.   COMBIGAN 0.2-0.5 % ophthalmic solution Place 1 drop into the left eye 2 (two) times daily.    Continuous Blood Gluc Receiver (DEXCOM G6 RECEIVER) DEVI Use as directed with Dexacom system- Dx E11.9- tests sugars 4 times a day  Continuous Blood Gluc Sensor (DEXCOM G6 SENSOR) MISC Apply as directed to use with Dexacom system Dx E11.9- tests sugars 4 times a day   Continuous Blood Gluc Transmit (DEXCOM G6 TRANSMITTER) MISC Use as directed with dexacom system dx E11.9  tests sugars 4 times a day   Cyanocobalamin (B-12) 2500 MCG TABS Take 1 tablet by mouth 2 (two) times a week. Three times a week on Monday Wednesday and Friday   Emollient (CERAVE) CREA Apply 1 application topically daily at 6  (six) AM.   empagliflozin (JARDIANCE) 10 MG TABS tablet Take 10 mg by mouth daily.   ferrous sulfate 325 (65 FE) MG EC tablet Take 325 mg by mouth every other day.   Flaxseed, Linseed, (FLAXSEED OIL PO) Take 1 capsule by mouth daily.    glucose blood (CONTOUR NEXT TEST) test strip USE 1 STRIP TO CHECK GLUCOSE THREE TIMES DAILY   insulin glargine (LANTUS SOLOSTAR) 100 UNIT/ML Solostar Pen INJECT 24 TO 25 UNITS SUBCUTANEOUSLY ONCE DAILY AT 10PM.   insulin glargine-yfgn (SEMGLEE, YFGN,) 100 UNIT/ML Pen INJECT 24-25 UNITS SUBCUTANEOUSLY ONCE DAILY AT  10:00  PM   insulin lispro (HUMALOG KWIKPEN) 100 UNIT/ML KwikPen INJECT TWICE DAILY PER SLIDING SCALE FOLLOWING BREAKFAST AND DINNER: <85=0UNITS, 85-110=3 UNITS, 111-160 = 5, 161-220 = 7, >221 = 9 UNITS   JARDIANCE 25 MG TABS tablet Take 25 mg by mouth daily.   ketoconazole (NIZORAL) 2 % cream Apply 1 Application topically 2 (two) times daily.   Lactobacillus (PROBIOTIC ACIDOPHILUS) CAPS Take 1 capsule by mouth daily at 6 (six) AM.   Multiple Vitamin (MULTIVITAMIN) tablet Take 1 tablet by mouth daily.   mupirocin ointment (BACTROBAN) 2 % Apply 1 Application topically 2 (two) times daily.   nitroGLYCERIN (NITROSTAT) 0.4 MG SL tablet PLACE 1 TAB UNDER TONGUE EVERY 5 MIN IF NEEDED FOR CHEST PAIN. MAY USE 3 TIMES.NO RELIEF CALL 911.   polyethylene glycol powder (MIRALAX) 17 GM/SCOOP powder Start by taking 1/2 capful mixed in 4 ounces of fluid daily. You can increase to 1 capful daily if needed to maintain easy BMs.   Polyvinyl Alcohol-Povidone (REFRESH OP) Place 1 drop into both ears 2 (two) times daily as needed (dryness).   tamsulosin (FLOMAX) 0.4 MG CAPS capsule Take 2 capsules po at bedtime. if this causes drowsiness or dizziness to reduce it to just 1/day   torsemide (DEMADEX) 20 MG tablet TAKE 1 TO 2 TABLETS BY MOUTH EACH MORNING AS DIRECTED FOR SWELLING IN THE LEGS. (Patient taking differently: 20 mg. Take 1 tablet on Monday, Wednesday, Friday and  Saturday for swelling in the legs)   Turmeric 500 MG CAPS Take 500 mg by mouth daily.   VELTASSA 8.4 g packet Take 1 packet by mouth daily.   erythromycin ophthalmic ointment Place 1 application into the left eye 3 (three) times daily as needed (infection).  (Patient not taking: Reported on 05/07/2022)   No facility-administered encounter medications on file as of 05/07/2022.    Allergies (verified) Adhesive [tape], Sulfa antibiotics, and Latex   History: Past Medical History:  Diagnosis Date   Anginal pain ([place])    Aortic atherosclerosis ([place]) 10/06/2020   Seen on CAT scan 2021   Arthritis    Bilateral knee   Atrial fibrillation ([place]) 12/28/2021   On Eliquis   CHF (congestive heart failure) ([facility])    Coronary artery disease    Diabetes mellitus    insulin dependent    DVT, lower extremity, distal, acute ([place]) 09/09/2018  Hyperlipidemia    Hypertension    Progressive angina ([place]) 03/2015   a. s/p DES to proximal LAD and balloon angioplasty to distal LAD in 2016 b. patent stent by repeat cath in 01/2017 with mild residual CAD)   Pulmonary nodule 10/06/2020   Seen on CAT scan 2020 1 repeat CAT scan in 2022   Stroke [facility])    Past Surgical History:  Procedure Laterality Date   APPENDECTOMY     CARDIAC CATHETERIZATION N/A 04/12/2015   Procedure: Left Heart Cath and Coronary Angiography;  Surgeon: Sherren Mocha, MD; pLAD 75>0% w/ 3.0x12 mm Resolute DES, dLAD 80>0% w/ Angiosculpt scoring balloon, CFX luminal irreg, RCA mild dz, EF 55-65%    COLONOSCOPY  2011   RMR: 1. Normal rectum 2. Left sided diverticula , 2 ascending colon polyps, status post snare polypectomy. Remainder of colonic mucosa appeared unremarkable.    COLONOSCOPY N/A 05/13/2016   Procedure: COLONOSCOPY;  Surgeon: Daneil Dolin, MD;  [facility];  Service: Endoscopy;  Laterality: N/A;  7:30 AM   COLONOSCOPY N/A 05/18/2018   Rourk: Diverticulosis, single tubular adenoma removed.  No plans for future  screening or surveillance colonoscopy due to age.   CORONARY ANGIOGRAPHY N/A 02/04/2017   Procedure: CORONARY ANGIOGRAPHY (CATH LAB);  Surgeon: Troy Sine, MD;  Location: [place] CV LAB;  Service: Cardiovascular;  Laterality: N/A;   CORONARY ANGIOPLASTY WITH STENT PLACEMENT  08/22/2014   PTCA/DES X mLAD with 2.5 x 16 mm Promus Premier DES by Dr Julianne Handler   ESOPHAGOGASTRODUODENOSCOPY N/A 05/18/2018   Rourk: Ulcerative reflux esophagitis, hiatal hernia, erosive gastropathy with H. pylori on biopsy, also in the setting of NSAID use.   FOOT SURGERY Right    LEFT HEART CATHETERIZATION WITH CORONARY ANGIOGRAM N/A 08/22/2014   Procedure: LEFT HEART CATHETERIZATION WITH CORONARY ANGIOGRAM;  Surgeon: Burnell Blanks, MD;  Location: [facility] CATH LAB;  Service: Cardiovascular;  Laterality: N/A;   PERCUTANEOUS CORONARY STENT INTERVENTION (PCI-S)  08/22/2014   Procedure: PERCUTANEOUS CORONARY STENT INTERVENTION (PCI-S);  Surgeon: Burnell Blanks, MD;    SHOULDER SURGERY     TONSILLECTOMY     Family History  Problem Relation Age of Onset   Diabetes Other    Diabetes Brother    Colon cancer Maternal Uncle    Skin cancer Mother    Diabetes Father    Hypertension Sister    Skin cancer Sister    Social History   Socioeconomic History   Marital status: Married    Spouse name: Not on file   Number of children: Not on file   Years of education: 14   Highest education level: Not on file  Occupational History   Occupation: retired  Tobacco Use   Smoking status: Former    Packs/day: 1.00    Years: 20.00    Total pack years: 20.00    Types: Cigarettes, Cigars    Start date: 08/10/1955    Quit date: 07/28/2007    Years since quitting: 14.7   Smokeless tobacco: Never  Vaping Use   Vaping Use: Never used  Substance and Sexual Activity   Alcohol use: Not Currently    Alcohol/week: 0.0 standard drinks of alcohol    Comment: Daily about 1 glass of wine and gin   Drug use: No   Sexual  activity: Not Currently  Other Topics Concern   Not on file  Social History Narrative   Not on file   Social Determinants of Radio broadcast assistant  Strain: Low Risk  (05/07/2022)   Overall Financial Resource Strain (CARDIA)    Difficulty of Paying Living Expenses: Not hard at all  Food Insecurity: No Food Insecurity (05/07/2022)   Hunger Vital Sign    Worried About Running Out of Food in the Last Year: Never true    Ran Out of Food in the Last Year: Never true  Transportation Needs: No Transportation Needs (05/07/2022)   PRAPARE - Hydrologist (Medical): No    Lack of Transportation (Non-Medical): No  Physical Activity: Insufficiently Active (05/07/2022)   Exercise Vital Sign    Days of Exercise per Week: 3 days    Minutes of Exercise per Session: 20 min  Stress: No Stress Concern Present (05/07/2022)   [place]    Feeling of Stress : Only a little  Social Connections: Moderately Isolated (05/07/2022)   Social Connection and Isolation Panel [NHANES]    Frequency of Communication with Friends and Family: More than three times a week    Frequency of Social Gatherings with Friends and Family: Once a week    Attends Religious Services: Never    Marine scientist or Organizations: No    Attends Music therapist: Never    Marital Status: Married    Tobacco Counseling Counseling given: Not Answered   Clinical Intake:  Pre-visit preparation completed: Yes        Diabetes: Yes CBG done?: No Did pt. bring in CBG monitor from home?: No  How often do you need to have someone help you when you read instructions, pamphlets, or other written materials from your doctor or pharmacy?: 1 - Never  Diabetic?yes Nutrition Risk Assessment:  Has the patient had any N/V/D within the last 2 months?  No  Does the patient have any non-healing wounds?  No  Has the  patient had any unintentional weight loss or weight gain?  No   Diabetes:  Is the patient diabetic?  Yes  If diabetic, was a CBG obtained today?  No  Did the patient bring in their glucometer from home?  No  How often do you monitor your CBG's? 5 times/day.   Financial Strains and Diabetes Management:  Are you having any financial strains with the device, your supplies or your medication? No .  Does the patient want to be seen by Chronic Care Management for management of their diabetes?  No  Would the patient like to be referred to a Nutritionist or for Diabetic Management?  No   Diabetic Exams:  Diabetic Eye Exam: Completed 06/12/21.  Pt has been advised about the importance in completing this exam.   Diabetic Foot Exam: Completed 03/10/22. Pt has been advised about the importance in completing this exam.   Interpreter Needed?: No  Information entered by :: Kirke Shaggy, LPN   Activities of Daily Living    05/07/2022   10:31 AM  In your present state of health, do you have any difficulty performing the following activities:  Hearing? 0  Vision? 1  Difficulty concentrating or making decisions? 0  Walking or climbing stairs? 1  Dressing or bathing? 0  Doing errands, shopping? 0  Preparing Food and eating ? N  Using the Toilet? N  In the past six months, have you accidently leaked urine? N  Do you have problems with loss of bowel control? N  Managing your Medications? N  Managing your Finances? N  Housekeeping or  managing your Housekeeping? N    Patient Care Team: Kathyrn Drown, MD as PCP - General (Family Medicine) Harl Bowie Alphonse Guild, MD as PCP - Cardiology (Cardiology) Gala Romney Cristopher Estimable, MD as Consulting Physician (Gastroenterology)  Indicate any recent Medical Services you may have received from other than [facility] providers in the past year (date may be approximate).     Assessment:   This is a routine wellness examination for [place].  Hearing/Vision  screen Hearing Screening - Comments:: No aids Vision Screening - Comments:: No glasses- [facility]  Dietary issues and exercise activities discussed: Current Exercise Habits: Home exercise routine, Type of exercise: walking;stretching;calisthenics;strength training/weights, Time (Minutes): 60, Frequency (Times/Week): 5, Weekly Exercise (Minutes/Week): 300, Intensity: Mild   Goals Addressed             This Visit's Progress    DIET - EAT MORE FRUITS AND VEGETABLES         Depression Screen    05/07/2022   10:27 AM 04/19/2021    9:19 AM 04/19/2021    9:14 AM 09/13/2020    8:51 AM 06/14/2020    9:14 AM 12/20/2019   10:04 AM 01/11/2018    8:09 AM  PHQ 2/9 Scores  PHQ - 2 Score 1 0 0 0 1 4 0  PHQ- 9 Score '5    7 19 2    '$ Fall Risk    05/07/2022   10:30 AM 04/19/2021    9:17 AM 12/16/2020    9:24 AM 10/29/2020    2:26 PM 09/13/2020    8:51 AM  Fall Risk   Falls in the past year? 1 0 0 0 0  Number falls in past yr: 0 0 0    Injury with Fall? 1 0 0    Risk for fall due to : History of fall(s);Impaired balance/gait Impaired balance/gait No Fall Risks    Follow up Falls evaluation completed;Falls prevention discussed Falls evaluation completed Falls evaluation completed Falls evaluation completed Falls evaluation completed    FALL RISK PREVENTION PERTAINING TO THE HOME:  Any stairs in or around the home? Yes  If so, are there any without handrails? No  Home free of loose throw rugs in walkways, pet beds, electrical cords, etc? Yes  Adequate lighting in your home to reduce risk of falls? Yes   ASSISTIVE DEVICES UTILIZED TO PREVENT FALLS:  Life alert? No  Use of a cane, walker or w/c? No  Grab bars in the bathroom? Yes  Shower chair or bench in shower? Yes  Elevated toilet seat or a handicapped toilet? Yes    Cognitive Function:        05/07/2022   10:34 AM 04/19/2021    9:22 AM  6CIT Screen  What Year? 0 points 0 points  What month? 0 points 0 points  What time? 0  points 0 points  Count back from 20 0 points 0 points  Months in reverse 0 points 0 points  Repeat phrase 0 points 0 points  Total Score 0 points 0 points    Immunizations Immunization History  Administered Date(s) Administered   Fluad Quad(high Dose 65+) 04/09/2020, 05/06/2021   Influenza Whole 05/04/2007, 05/01/2008, 05/04/2008   Influenza,inj,Quad PF,6+ Mos 05/07/2014, 06/06/2015, 04/13/2016, 04/26/2017, 05/19/2018, 04/11/2019   Influenza-Unspecified 04/26/2012, 05/17/2013   PFIZER(Purple Top)SARS-COV-2 Vaccination 08/07/2019, 08/26/2019, 05/09/2020   PNEUMOCOCCAL CONJUGATE-20 04/01/2022   Pneumococcal Conjugate-13 01/30/2014   Pneumococcal Polysaccharide-23 07/27/2006   Td 05/01/2008, 05/04/2008, 08/30/2017   Tdap 02/12/2019   Zoster Recombinat (Shingrix)  08/28/2017, 02/11/2018   Zoster, Live 07/27/2008    TDAP status: Up to date  Flu Vaccine status: Up to date  Pneumococcal vaccine status: Up to date  Covid-19 vaccine status: Completed vaccines  Qualifies for Shingles Vaccine? Yes   Zostavax completed Yes   Shingrix Completed?: Yes  Screening Tests Health Maintenance  Topic Date Due   COVID-19 Vaccine (4 - Pfizer risk series) 07/04/2020   INFLUENZA VACCINE  02/24/2022   OPHTHALMOLOGY EXAM  06/12/2022   HEMOGLOBIN A1C  09/02/2022   FOOT EXAM  03/11/2023   COLONOSCOPY (Pts 45-58yr Insurance coverage will need to be confirmed)  05/19/2023   TETANUS/TDAP  02/11/2029   Pneumonia Vaccine 88 Years old  Completed   Zoster Vaccines- Shingrix  Completed   HPV VACCINES  Aged Out    Health Maintenance  Health Maintenance Due  Topic Date Due   COVID-19 Vaccine (4 - Pfizer risk series) 07/04/2020   INFLUENZA VACCINE  02/24/2022    Colorectal cancer screening: No longer required.   Lung Cancer Screening: (Low Dose CT Chest recommended if Age 86-80years, 30 pack-year currently smoking OR have quit w/in 15years.) does not qualify.   Additional  Screening:  Hepatitis C Screening: does not qualify; Completed no  Vision Screening: Recommended annual ophthalmology exams for early detection of glaucoma and other disorders of the eye. Is the patient up to date with their annual eye exam?  Yes  Who is the provider or what is the name of the office in which the patient attends annual eye exams? [facility] is not established with a provider, would they like to be referred to a provider to establish care? No .   Dental Screening: Recommended annual dental exams for proper oral hygiene  Community Resource Referral / Chronic Care Management: CRR required this visit?  No   CCM required this visit?  No      Plan:     I have personally reviewed and noted the following in the patient's chart:   Medical and social history Use of alcohol, tobacco or illicit drugs  Current medications and supplements including opioid prescriptions. Patient is not currently taking opioid prescriptions. Functional ability and status Nutritional status Physical activity Advanced directives List of other physicians Hospitalizations, surgeries, and ER visits in previous 12 months Vitals Screenings to include cognitive, depression, and falls Referrals and appointments  In addition, I have reviewed and discussed with patient certain preventive protocols, quality metrics, and best practice recommendations. A written personalized care plan for preventive services as well as general preventive health recommendations were provided to patient.     LDionisio David LPN   [id number]  Nurse Notes: none

## 2022-05-10 ENCOUNTER — Telehealth: Payer: Self-pay | Admitting: Family Medicine

## 2022-05-10 NOTE — Telephone Encounter (Signed)
Nurses Please connect with patient.  Let him know that you are calling on behalf of myself to find out what is going on with his wife we may need to see her in the near future  First things first please talk with him to see what concerns he has thank you  See message below from annual wellness visit nurse  Jerry David, LPN  Kathyrn Drown, MD Hi Dr.Elpidio Thielen, I did him and his wife's AWVs this morning. He asked if you could give him a call about his wife. He is desperate for help. Would you please call him? Thanks!

## 2022-05-11 ENCOUNTER — Other Ambulatory Visit: Payer: Self-pay | Admitting: Family Medicine

## 2022-05-11 NOTE — Telephone Encounter (Signed)
Please see message in [facility] chart.

## 2022-05-16 ENCOUNTER — Other Ambulatory Visit: Payer: Self-pay | Admitting: Family Medicine

## 2022-05-16 DIAGNOSIS — E1122 Type 2 diabetes mellitus with diabetic chronic kidney disease: Secondary | ICD-10-CM

## 2022-05-21 ENCOUNTER — Other Ambulatory Visit: Payer: Self-pay | Admitting: Family Medicine

## 2022-05-21 ENCOUNTER — Telehealth: Payer: Self-pay | Admitting: Family Medicine

## 2022-05-21 DIAGNOSIS — R748 Abnormal levels of other serum enzymes: Secondary | ICD-10-CM

## 2022-05-21 DIAGNOSIS — N1831 Chronic kidney disease, stage 3a: Secondary | ICD-10-CM

## 2022-05-21 NOTE — Telephone Encounter (Signed)
No referral to GI has been placed per courtney. Will go ahead and place referral

## 2022-05-21 NOTE — Telephone Encounter (Signed)
Is pt to check sugars 4 or 5 times per day? Please advise. Thank you  Checking with courtney on referral

## 2022-05-21 NOTE — Telephone Encounter (Signed)
Patient is requesting more test strips from [facility] only gave him 3 and he needs a prescription for 5 aday. Also checking on referral to Dr, Sydell Axon

## 2022-05-21 NOTE — Telephone Encounter (Signed)
In regards to the Accu-Cheks I would recommend 4 times daily I do not believe he necessarily needs 5 times per day He can get his finger a rest but this utilizing a fingerstick glucose 4 times daily

## 2022-05-22 MED ORDER — CONTOUR NEXT TEST VI STRP: ORAL_STRIP | 0 refills | Status: DC

## 2022-05-22 NOTE — Telephone Encounter (Signed)
Pt contacted and verbalized understanding. Test strips sent to pharmacy

## 2022-05-23 ENCOUNTER — Telehealth: Payer: Self-pay | Admitting: Family Medicine

## 2022-05-23 NOTE — Telephone Encounter (Signed)
Nurses Medicare allows for fingerstick glucose checks maximum 4 times per day for individuals who are on multiple shots of insulin per day Please make sure patient is aware he should check his sugars on average no greater than 4 times per day because of these regulations Also he was in the process of trying to get a continuous glucose monitor-wonder whether that is currently in the process?  Please see form that was sent by [facility] His most recent office visits document his diabetes if we need to send the documentation to [facility] with this filled out form thank you

## 2022-05-25 ENCOUNTER — Encounter: Payer: Self-pay | Admitting: Internal Medicine

## 2022-05-25 NOTE — Telephone Encounter (Signed)
Pt is aware that he should test 4 times per day. My chart message sent to patient regarding CGM

## 2022-05-28 ENCOUNTER — Ambulatory Visit: Payer: Medicare Other | Attending: Cardiology | Admitting: Cardiology

## 2022-05-28 ENCOUNTER — Encounter: Payer: Self-pay | Admitting: Cardiology

## 2022-05-28 VITALS — BP 104/70 | HR 79 | Ht 72.0 in | Wt 213.2 lb

## 2022-05-28 DIAGNOSIS — I2510 Atherosclerotic heart disease of native coronary artery without angina pectoris: Secondary | ICD-10-CM | POA: Diagnosis not present

## 2022-05-28 DIAGNOSIS — I4891 Unspecified atrial fibrillation: Secondary | ICD-10-CM | POA: Diagnosis not present

## 2022-05-28 DIAGNOSIS — D6869 Other thrombophilia: Secondary | ICD-10-CM | POA: Diagnosis not present

## 2022-05-28 DIAGNOSIS — R079 Chest pain, unspecified: Secondary | ICD-10-CM | POA: Insufficient documentation

## 2022-05-28 NOTE — Telephone Encounter (Signed)
Order for CGM sent via [place]; will keep following

## 2022-05-28 NOTE — Progress Notes (Signed)
Clinical Summary Mr. Seats is a 88 y.o.male seen today for follow up of the following medical problems.    1. CAD  - prior DES to prox LAD in 2016, repeat cath 2018 with patent stent -no recent chest pain, no SOB/DOE - compliant with meds  - dull pain left sided, can occur at rest or with activity. 3/10 in severity, some SOB/DOE that is progressing. Lasts about 10 minutes, occurs 3 times a day. No relation to food or eating.       2. Chronic diastolic HF - 11/5730 echo LVEF 55-60%, normal RV function, indet DDx. Small to mod pericardial effusion    - taking torsemide, controls swelling.    3. Pericardial effusion - resolved   4. CKD 3 - followed by nephrology  5. Afib -noted on recent monitor -no recent palpitaitons.   - compliant with eliquis, no signs of bleeding.   6. Hyperlipidemia - 02/2022 TC 132 TG 104 HDL 48 LDL 65 - he is on atorvastatin '80mg'$  daily.      Past Medical History:  Diagnosis Date   Anginal pain ([place])    Aortic atherosclerosis ([place]) 10/06/2020   Seen on CAT scan 2021   Arthritis    Bilateral knee   Atrial fibrillation ([place]) 12/28/2021   On Eliquis   CHF (congestive heart failure) ([place])    Coronary artery disease    Diabetes mellitus    insulin dependent    DVT, lower extremity, distal, acute ([place]) 09/09/2018   Hyperlipidemia    Hypertension    Progressive angina ([place]) 03/2015   a. s/p DES to proximal LAD and balloon angioplasty to distal LAD in 2016 b. patent stent by repeat cath in 01/2017 with mild residual CAD)   Pulmonary nodule 10/06/2020   Seen on CAT scan 2020 1 repeat CAT scan in 2022   [facility]roke [facility])      Allergies  Allergen Reactions   Adhesive [Tape]     bandaids cause blisters after leaving on for 24 hours    Sulfa Antibiotics Other (See Comments)    "was terrible" "eye went crazy"   Latex Other (See Comments)    blisters     Current Outpatient Medications  Medication Sig Dispense Refill   apixaban (ELIQUIS) 2.5  MG TABS tablet Take 1 tablet (2.5 mg total) by mouth 2 (two) times daily. 60 tablet 11   Ascorbic Acid (VITAMIN C) 1000 MG tablet Take 1,000 mg by mouth daily.      atorvastatin (LIPITOR) 80 MG tablet TAKE (1) TABLET BY MOUTH ONCE DAILY. 90 tablet 0   augmented betamethasone dipropionate (DIPROLENE-AF) 0.05 % ointment Apply topically 2 (two) times daily as needed.     B-D UF III MINI PEN NEEDLES 31G X 5 MM MISC SMARTSIG:1 Each SUB-Q Twice Daily     BD PEN NEEDLE NANO 2ND GEN 32G X 4 MM MISC USE 1 PEN NEEDLE FIVE TIMES DAILY     betamethasone dipropionate 0.05 % cream Apply topically 2 (two) times daily.     carvedilol (COREG) 6.25 MG tablet TAKE (1) TABLET BY MOUTHTTWICE DAILY.     COMBIGAN 0.2-0.5 % ophthalmic solution Place 1 drop into the left eye 2 (two) times daily.   2   Continuous Blood Gluc Receiver (DEXCOM G6 RECEIVER) DEVI Use as directed with Dexacom system- Dx E11.9- tests sugars 4 times a day 1 each 0   Continuous Blood Gluc Sensor (DEXCOM G6 SENSOR) MISC Apply as directed to  use with Dexacom system Dx E11.9- tests sugars 4 times a day 3 each 1   Continuous Blood Gluc Transmit (DEXCOM G6 TRANSMITTER) MISC Use as directed with dexacom system dx E11.9  tests sugars 4 times a day 1 each 0   Cyanocobalamin (B-12) 2500 MCG TABS Take 1 tablet by mouth 2 (two) times a week. Three times a week on Monday Wednesday and Friday     Emollient (CERAVE) CREA Apply 1 application topically daily at 6 (six) AM.     empagliflozin (JARDIANCE) 10 MG TABS tablet Take 10 mg by mouth daily.     erythromycin ophthalmic ointment Place 1 application into the left eye 3 (three) times daily as needed (infection).  (Patient not taking: Reported on 05/07/2022)     ferrous sulfate 325 (65 FE) MG EC tablet Take 325 mg by mouth every other day.     Flaxseed, Linseed, (FLAXSEED OIL PO) Take 1 capsule by mouth daily.      glucose blood (CONTOUR NEXT TEST) test strip Use to check blood sugars four time daily 400 each 0    insulin glargine (LANTUS SOLOSTAR) 100 UNIT/ML Solostar Pen INJECT 24 TO 25 UNITS SUBCUTANEOUSLY ONCE DAILY AT 10PM. 15 mL 5   insulin glargine-yfgn (SEMGLEE, YFGN,) 100 UNIT/ML Pen INJECT 24-25 UNITS SUBCUTANEOUSLY ONCE DAILY AT  10:00  PM 15 mL 1   insulin lispro (HUMALOG KWIKPEN) 100 UNIT/ML KwikPen INJECT TWICE DAILY PER SLIDING SCALE FOLLOWING BREAKFAST AND DINNER: <85=0UNITS, 85-110=3 UNITS, 111-160 = 5, 161-220 = 7, >221 = 9 UNITS 15 mL 5   JARDIANCE 25 MG TABS tablet Take 25 mg by mouth daily.     ketoconazole (NIZORAL) 2 % cream Apply 1 Application topically 2 (two) times daily. 120 g 4   Lactobacillus (PROBIOTIC ACIDOPHILUS) CAPS Take 1 capsule by mouth daily at 6 (six) AM.     Multiple Vitamin (MULTIVITAMIN) tablet Take 1 tablet by mouth daily.     mupirocin ointment (BACTROBAN) 2 % Apply 1 Application topically 2 (two) times daily. 22 g 0   nitroGLYCERIN (NITROSTAT) 0.4 MG SL tablet PLACE 1 TAB UNDER TONGUE EVERY 5 MIN IF NEEDED FOR CHEST PAIN. MAY USE 3 TIMES.NO RELIEF CALL 911. 25 tablet 0   polyethylene glycol powder (MIRALAX) 17 GM/SCOOP powder Start by taking 1/2 capful mixed in 4 ounces of fluid daily. You can increase to 1 capful daily if needed to maintain easy BMs. 527 g 5   Polyvinyl Alcohol-Povidone (REFRESH OP) Place 1 drop into both ears 2 (two) times daily as needed (dryness).     tamsulosin (FLOMAX) 0.4 MG CAPS capsule Take 2 capsules po at bedtime. if this causes drowsiness or dizziness to reduce it to just 1/day 180 capsule 0   torsemide (DEMADEX) 20 MG tablet TAKE 1 TO 2 TABLETS BY MOUTH EACH MORNING AS DIRECTED FOR SWELLING IN THE LEGS. (Patient taking differently: 20 mg. Take 1 tablet on Monday, Wednesday, Friday and Saturday for swelling in the legs) 60 tablet 6   Turmeric 500 MG CAPS Take 500 mg by mouth daily.     VELTASSA 8.4 g packet Take 1 packet by mouth daily.     No current facility-administered medications for this visit.     Past Surgical History:   Procedure Laterality Date   APPENDECTOMY     CARDIAC CATHETERIZATION N/A 04/12/2015   Procedure: Left Heart Cath and Coronary Angiography;  Surgeon: Sherren Mocha, MD; pLAD 75>0% w/ 3.0x12 mm Resolute DES, dLAD 80>0% w/ Angiosculpt  scoring balloon, CFX luminal irreg, RCA mild dz, EF 55-65%    COLONOSCOPY  2011   RMR: 1. Normal rectum 2. Left sided diverticula , 2 ascending colon polyps, status post snare polypectomy. Remainder of colonic mucosa appeared unremarkable.    COLONOSCOPY N/A 05/13/2016   Procedure: COLONOSCOPY;  Surgeon: Daneil Dolin, MD;  Location: [facility];  Service: Endoscopy;  Laterality: N/A;  7:30 AM   COLONOSCOPY N/A 05/18/2018   Rourk: Diverticulosis, single tubular adenoma removed.  No plans for future screening or surveillance colonoscopy due to age.   CORONARY ANGIOGRAPHY N/A 02/04/2017   Procedure: CORONARY ANGIOGRAPHY (CATH LAB);  Surgeon: Troy Sine, MD;  Location: [facility] CV LAB;  Service: Cardiovascular;  Laterality: N/A;   CORONARY ANGIOPLASTY WITH STENT PLACEMENT  08/22/2014   PTCA/DES X mLAD with 2.5 x 16 mm Promus Premier DES by Dr Julianne Handler   ESOPHAGOGASTRODUODENOSCOPY N/A 05/18/2018   Rourk: Ulcerative reflux esophagitis, hiatal hernia, erosive gastropathy with H. pylori on biopsy, also in the setting of NSAID use.   FOOT SURGERY Right    LEFT HEART CATHETERIZATION WITH CORONARY ANGIOGRAM N/A 08/22/2014   Procedure: LEFT HEART CATHETERIZATION WITH CORONARY ANGIOGRAM;  Surgeon: Burnell Blanks, MD;  Location: [facility] CATH LAB;  Service: Cardiovascular;  Laterality: N/A;   PERCUTANEOUS CORONARY STENT INTERVENTION (PCI-S)  08/22/2014   Procedure: PERCUTANEOUS CORONARY STENT INTERVENTION (PCI-S);  Surgeon: Burnell Blanks, MD;    SHOULDER SURGERY     TONSILLECTOMY       Allergies  Allergen Reactions   Adhesive [Tape]     bandaids cause blisters after leaving on for 24 hours    Sulfa Antibiotics Other (See Comments)    "was terrible"  "eye went crazy"   Latex Other (See Comments)    blisters      Family History  Problem Relation Age of Onset   Diabetes Other    Diabetes Brother    Colon cancer Maternal Uncle    Skin cancer Mother    Diabetes Father    Hypertension Sister    Skin cancer Sister      Social History Mr. Gildersleeve reports that he quit smoking about 14 years ago. His smoking use included cigarettes and cigars. He started smoking about 66 years ago. He has a 20.00 pack-year smoking history. He has never used smokeless tobacco. Mr. Haq reports that he does not currently use alcohol.   Review of Systems CONSTITUTIONAL: No weight loss, fever, chills, weakness or fatigue.  HEENT: Eyes: No visual loss, blurred vision, double vision or yellow sclerae.No hearing loss, sneezing, congestion, runny nose or sore throat.  SKIN: No rash or itching.  CARDIOVASCULAR: per hpi RESPIRATORY: No shortness of breath, cough or sputum.  GASTROINTESTINAL: No anorexia, nausea, vomiting or diarrhea. No abdominal pain or blood.  GENITOURINARY: No burning on urination, no polyuria NEUROLOGICAL: No headache, dizziness, syncope, paralysis, ataxia, numbness or tingling in the extremities. No change in bowel or bladder control.  MUSCULOSKELETAL: No muscle, back pain, joint pain or stiffness.  LYMPHATICS: No enlarged nodes. No history of splenectomy.  PSYCHIATRIC: No history of depression or anxiety.  ENDOCRINOLOGIC: No reports of sweating, cold or heat intolerance. No polyuria or polydipsia.  Marland Kitchen   Physical Examination Today's Vitals   05/28/22 0827  BP: 104/70  Pulse: 79  SpO2: 97%  Weight: 213 lb 3.2 oz (96.7 kg)  Height: 6' (1.829 m)   Body mass index is 28.92 kg/m.  Gen: resting comfortably, no acute distress HEENT:  no scleral icterus, pupils equal round and reactive, no palptable cervical adenopathy,  CV: irreg, no m/r/g no jvd Resp: Clear to auscultation bilaterally GI: abdomen is soft, non-tender,  non-distended, normal bowel sounds, no hepatosplenomegaly MSK: extremities are warm, no edema.  Skin: warm, no rash Neuro:  no focal deficits Psych: appropriate affect   Diagnostic Studies Echocardiogram 12/15/19:   1. Left ventricular ejection fraction, by estimation, is 55%. The left  ventricle has normal function. The left ventricle demonstrates regional  wall motion abnormalities (see scoring diagram/findings for description).  Left ventricular diastolic  parameters are indeterminate.   2. Right ventricular systolic function is normal. The right ventricular  size is normal. There is mildly elevated pulmonary artery systolic  pressure. The estimated right ventricular systolic pressure is 78.4 mmHg.   3. Left atrial size was moderately dilated.   4. Moderate pericardial effusion. The pericardial effusion is  circumferential with anterior predominance. There is evidence of  organization and potential chronicity. No clear right ventricular  compromise or diagnostic variation in mitral outflow to  suggest tamponade physiology.   5. The mitral valve is grossly normal. Mild mitral valve regurgitation.   6. The aortic valve is tricuspid. Aortic valve regurgitation is not  visualized.   7. The inferior vena cava is dilated in size with <50% respiratory  variability, suggesting right atrial pressure of 15 mmHg.    NST: 12/2016 There was no ST segment deviation noted during stress. Findings consistent with prior inferior/inferoseptal/inferoapical myocardial infarction with moderate peri-infarct ischemia. This is an intermediate risk study. The left ventricular ejection fraction is normal (55-65%).   Cardiac Catheterization: 01/2017 Prox RCA lesion, 15 %stenosed. Dist RCA lesion, 20 %stenosed. 1st Diag lesion, 20 %stenosed. Ost LAD-1 lesion, 40 %stenosed. Ost LAD-2 lesion, 0 %stenosed. Dist LAD-1 lesion, 30 %stenosed. Dist LAD-2 lesion, 15 %stenosed.   Mild residual CAD with a  patent proximal LAD stent and 30-40% narrowing immediately proximal to the stented segment, 20% proximal diagonal stenosis, 30% mid LAD stenosis, patent distal LAD stent with 15% intimal hyperplasia; normal left circumflex system; mild 20% proximal and distal stenoses in a dominant RCA.   RECOMMENDATION: Medical therapy.  Recommend echo Doppler study for LV function assessment since despite prolonged attempt at crossing the aortic valve with a catheter due to the significant tortuosity and left ventriculography was not able to be performed during this procedure.  11/2021 monitor 14 day monitor Afib throughout the study with min HR 46, Max HR 119, Avg HR 73 Frequent ventricular ectopy as PVCs (8.9%). Rare couplets and triplets. Two runs of NSVT longest 4 beats No symptoms reported     Patch Wear Time:  13 days and 16 hours (2023-04-24T14:06:58-0400 to 2023-05-08T06:35:47-0400)   2 Ventricular Tachycardia runs occurred, the run with the fastest interval lasting 4 beats with a max rate of 193 bpm, the longest lasting 4 beats with an avg rate of 125 bpm. Atrial Flutter occurred continuously (100% burden), ranging from 46-119 bpm  (avg of 73 bpm). Isolated VEs were frequent (8.9%, [id number]), VE Couplets were rare (<1.0%, 3002), and VE Triplets were rare (<1.0%, 275). Ventricular Bigeminy and Trigeminy were present.     Assessment and Plan  1. CAD - some recent chest pain unclear etiology, will obtain lexiscan to further evaluate   2. Chronic diastolic HF - doing well on torsemide, continue  3. Afib/acquired thrombophilia - persistent afib, has done very well with just rate control. Have not attempted to restore SR as he has been  asymptomatic and easilty rate contorlled - continue eliquis for stroke prevention      Arnoldo Lenis, M.D.

## 2022-05-28 NOTE — Patient Instructions (Signed)
Medication Instructions:  Your physician recommends that you continue on your current medications as directed. Please refer to the Current Medication list given to you today.   Labwork: None  Testing/Procedures: Your physician has requested that you have a lexiscan myoview. For further information please visit [email or web address]. Please follow instruction sheet, as given.   Follow-Up: Follow up with Dr. Branch in 6 months.   Any Other Special Instructions Will Be Listed Below (If Applicable).     If you need a refill on your cardiac medications before your next appointment, please call your pharmacy.  

## 2022-05-29 ENCOUNTER — Encounter (HOSPITAL_COMMUNITY)
Admission: RE | Admit: 2022-05-29 | Discharge: 2022-05-29 | Disposition: A | Discharge status: Home / Self Care | Payer: Medicare Other | Source: Ambulatory Visit | Attending: Cardiology | Admitting: Cardiology

## 2022-05-29 ENCOUNTER — Ambulatory Visit (HOSPITAL_COMMUNITY)
Admission: RE | Admit: 2022-05-29 | Discharge: 2022-05-29 | Disposition: A | Discharge status: Home / Self Care | Payer: Medicare Other | Source: Ambulatory Visit | Attending: Cardiology | Admitting: Cardiology

## 2022-05-29 DIAGNOSIS — R079 Chest pain, unspecified: Secondary | ICD-10-CM | POA: Insufficient documentation

## 2022-05-29 LAB — NM MYOCAR MULTI W/SPECT W/WALL MOTION / EF
LV dias vol: 89 mL (ref 62–150)
LV sys vol: 46 mL
Nuc Stress EF: 48 %
Peak HR: 115 {beats}/min
RATE: 0.4
Rest HR: 77 {beats}/min
Rest Nuclear Isotope Dose: 10 mCi
SDS: 3
SRS: 4
SSS: 7
ST Depression (mm): 0 mm
Stress Nuclear Isotope Dose: 30 mCi
TID: 1

## 2022-05-29 MED ORDER — SODIUM CHLORIDE FLUSH 0.9 % IV SOLN
INTRAVENOUS | Status: AC
  Administered 2022-05-29: 10 mL via INTRAVENOUS
  Filled 2022-05-29: qty 10

## 2022-05-29 MED ORDER — TECHNETIUM TC 99M TETROFOSMIN IV KIT
30.0000 | PACK | Freq: Once | INTRAVENOUS | Status: AC | PRN
  Administered 2022-05-29: 30 via INTRAVENOUS

## 2022-05-29 MED ORDER — TECHNETIUM TC 99M TETROFOSMIN IV KIT
10.0000 | PACK | Freq: Once | INTRAVENOUS | Status: AC | PRN
  Administered 2022-05-29: 10 via INTRAVENOUS

## 2022-05-29 MED ORDER — REGADENOSON 0.4 MG/5ML IV SOLN
INTRAVENOUS | Status: AC
  Administered 2022-05-29: 0.4 mg via INTRAVENOUS
  Filled 2022-05-29: qty 5

## 2022-06-02 ENCOUNTER — Encounter: Payer: Self-pay | Admitting: Cardiology

## 2022-06-02 ENCOUNTER — Other Ambulatory Visit: Payer: Self-pay | Admitting: Family Medicine

## 2022-06-02 MED ORDER — DEXCOM G6 TRANSMITTER MISC: 0 refills | Status: DC

## 2022-06-02 MED ORDER — DEXCOM G6 RECEIVER DEVI: 0 refills | Status: DC

## 2022-06-02 MED ORDER — DEXCOM G6 SENSOR MISC: 1 refills | Status: DC

## 2022-06-02 NOTE — Telephone Encounter (Signed)
Just resulted   J Xiao Graul MD 

## 2022-06-03 ENCOUNTER — Telehealth: Payer: Self-pay

## 2022-06-03 MED ORDER — ISOSORBIDE MONONITRATE ER 30 MG PO TB24: 15.0000 mg | ORAL_TABLET | Freq: Every day | ORAL | 3 refills | Status: DC

## 2022-06-03 NOTE — Telephone Encounter (Signed)
-----   Message from Laurine Blazer, LPN sent at 83/07/5174 11:15 AM EST -----  ----- Message ----- From: Arnoldo Lenis, MD Sent: 06/02/2022   9:57 AM EST To: Laurine Blazer, LPN  Stress test suggests perhaps a small abnormal area on the heart representing a small blockage. Its small enough where we would initially try to treat it with medications, particularly given his renal dysfunction and risk for cath. Can he start imdur '15mg'$  daily, f/u with me 1 month could use a noon slot.  Zandra Abts MD

## 2022-06-03 NOTE — Telephone Encounter (Signed)
Patient notified and verbalized understanding. Patient is scheduled to see provider on 07/01/22 @ 12 pm in the [place] office.

## 2022-06-05 ENCOUNTER — Other Ambulatory Visit: Payer: Self-pay

## 2022-06-08 DIAGNOSIS — E875 Hyperkalemia: Secondary | ICD-10-CM | POA: Diagnosis not present

## 2022-06-08 DIAGNOSIS — N1832 Chronic kidney disease, stage 3b: Secondary | ICD-10-CM | POA: Diagnosis not present

## 2022-06-09 LAB — BASIC METABOLIC PANEL
BUN/Creatinine Ratio: 28 — ABNORMAL HIGH (ref 10–24)
BUN: 45 mg/dL — ABNORMAL HIGH (ref 8–27)
CO2: 21 mmol/L (ref 20–29)
Calcium: 10 mg/dL (ref 8.6–10.2)
Chloride: 104 mmol/L (ref 96–106)
Creatinine, Ser: 1.59 mg/dL — ABNORMAL HIGH (ref 0.76–1.27)
Glucose: 138 mg/dL — ABNORMAL HIGH (ref 70–99)
Potassium: 4.7 mmol/L (ref 3.5–5.2)
Sodium: 142 mmol/L (ref 134–144)
eGFR: 42 mL/min/{1.73_m2} — ABNORMAL LOW (ref >59)

## 2022-06-12 ENCOUNTER — Ambulatory Visit: Payer: Medicare Other | Admitting: Family Medicine

## 2022-06-15 ENCOUNTER — Ambulatory Visit (INDEPENDENT_AMBULATORY_CARE_PROVIDER_SITE_OTHER): Payer: Medicare Other | Admitting: Family Medicine

## 2022-06-15 ENCOUNTER — Encounter: Payer: Self-pay | Admitting: Family Medicine

## 2022-06-15 VITALS — BP 122/64 | Wt 211.2 lb

## 2022-06-15 DIAGNOSIS — E1122 Type 2 diabetes mellitus with diabetic chronic kidney disease: Secondary | ICD-10-CM

## 2022-06-15 DIAGNOSIS — E119 Type 2 diabetes mellitus without complications: Secondary | ICD-10-CM | POA: Diagnosis not present

## 2022-06-15 DIAGNOSIS — I2510 Atherosclerotic heart disease of native coronary artery without angina pectoris: Secondary | ICD-10-CM | POA: Diagnosis not present

## 2022-06-15 DIAGNOSIS — N1831 Chronic kidney disease, stage 3a: Secondary | ICD-10-CM

## 2022-06-15 DIAGNOSIS — H40053 Ocular hypertension, bilateral: Secondary | ICD-10-CM | POA: Diagnosis not present

## 2022-06-15 DIAGNOSIS — Z23 Encounter for immunization: Secondary | ICD-10-CM | POA: Diagnosis not present

## 2022-06-15 DIAGNOSIS — I10 Essential (primary) hypertension: Secondary | ICD-10-CM

## 2022-06-15 DIAGNOSIS — R748 Abnormal levels of other serum enzymes: Secondary | ICD-10-CM

## 2022-06-15 DIAGNOSIS — E1169 Type 2 diabetes mellitus with other specified complication: Secondary | ICD-10-CM

## 2022-06-15 DIAGNOSIS — E785 Hyperlipidemia, unspecified: Secondary | ICD-10-CM | POA: Diagnosis not present

## 2022-06-15 DIAGNOSIS — Z794 Long term (current) use of insulin: Secondary | ICD-10-CM | POA: Diagnosis not present

## 2022-06-15 DIAGNOSIS — I69398 Other sequelae of cerebral infarction: Secondary | ICD-10-CM | POA: Diagnosis not present

## 2022-06-15 LAB — HM DIABETES EYE EXAM

## 2022-06-15 NOTE — Progress Notes (Signed)
   Subjective:    Patient ID: Jerry Burns, male    DOB: 02/20/1934, 88 y.o.   MRN: [id number]  Diabetes He presents for his follow-up diabetic visit. He has type 2 diabetes mellitus. There are no hypoglycemic associated symptoms. There are no diabetic associated symptoms. There are no hypoglycemic complications. There are no diabetic complications. He is following a generally healthy diet. Eye exam is current (had eye exam completed this morning).   Pt recently seen Dr.Branch; had stress test done and goes back in December for follow up.  Cardiac test shows possible reversible ischemia he started him on a low-dose Imdur he denies any shortness of breath or chest pressure currently  Essential hypertension - Plan: Hemoglobin A1c, Hepatic function panel  Hyperlipidemia associated with type 2 diabetes mellitus ([name])  Need for vaccination - Plan: Flu Vaccine QUAD High Dose(Fluad)  Elevated liver enzymes - Plan: Hemoglobin A1c, Hepatic function panel  Type 2 diabetes mellitus with stage 3a chronic kidney disease, with long-term current use of insulin ([name]) - Plan: Hemoglobin A1c, Hepatic function panel   Review of Systems     Objective:   Physical Exam General-in no acute distress Eyes-no discharge Lungs-respiratory rate normal, CTA CV-no murmurs,RRR Extremities skin warm dry no edema Neuro grossly normal Behavior normal, alert        Assessment & Plan:  1. Essential hypertension HTN- patient seen for follow-up regarding HTN.   Diet, medication compliance, appropriate labs and refills were completed.   Importance of keeping blood pressure under good control to lessen the risk of complications discussed Regular follow-up visits discussed  Blood pressure sitting and standing no appreciable change  2. Hyperlipidemia associated with type 2 diabetes mellitus (HCC) Hyperlipidemia-importance of diet, weight control, activity, compliance with medications discussed.   Recent  labs reviewed.   Any additional labs or refills ordered.   Importance of keeping under good control discussed. Regular follow-up visits discussed  Patient has chronic kidney disease We did discuss ways to delay progression of chronic kidney disease including #1 keeping blood pressure under good control with a goal being less than 130/80 #2 keeping diabetes under good control with a goal A1c less than or equal to 7 #3 use of ACE or ARB in the appropriate setting #4 avoidance of NSAIDs, Cox 2 inhibitors, and 1 possible IV contrast #5 in situations of volume depletion such as gastroenteritis nausea vomiting diarrhea it is important to hold ACE inhibitors, ARB's, SGLT2's and to seek help for the underlying illness causing volume depletion #6 use of SGLT2 inhibitors and or Karendia  Periodic monitoring of metabolic 7 with GFR including urine ACR will be done. Inappropriate situations discussion/consultation with nephrology  The patient was seen today as part of a comprehensive visit for diabetes. The importance of keeping her A1c at or below 7 range was discussed.  Discussed diet, activity, and medication compliance Emphasized healthy eating primarily with vegetables fruits and if utilizing meats lean meats such as chicken or fish grilled baked broiled Avoid sugary drinks Minimize and avoid processed foods Fit in regular physical activity preferably 25 to 30 minutes 4 times per week Standard follow-up visit recommended.  Patient aware lack of control and follow-up increases risk of diabetic complications. Regular follow-up visits Yearly ophthalmology Yearly foot exam  He will do lab work in early December he will follow-up in approximately 3 to 4 months

## 2022-06-24 ENCOUNTER — Encounter: Payer: Self-pay | Admitting: Internal Medicine

## 2022-06-24 ENCOUNTER — Ambulatory Visit (INDEPENDENT_AMBULATORY_CARE_PROVIDER_SITE_OTHER): Payer: Medicare Other | Admitting: Internal Medicine

## 2022-06-24 VITALS — BP 116/68 | HR 68 | Temp 97.1°F | Ht 72.0 in | Wt 211.8 lb

## 2022-06-24 DIAGNOSIS — R932 Abnormal findings on diagnostic imaging of liver and biliary tract: Secondary | ICD-10-CM

## 2022-06-24 DIAGNOSIS — I129 Hypertensive chronic kidney disease with stage 1 through stage 4 chronic kidney disease, or unspecified chronic kidney disease: Secondary | ICD-10-CM | POA: Diagnosis not present

## 2022-06-24 DIAGNOSIS — K769 Liver disease, unspecified: Secondary | ICD-10-CM | POA: Diagnosis not present

## 2022-06-24 DIAGNOSIS — N400 Enlarged prostate without lower urinary tract symptoms: Secondary | ICD-10-CM | POA: Diagnosis not present

## 2022-06-24 DIAGNOSIS — N1832 Chronic kidney disease, stage 3b: Secondary | ICD-10-CM | POA: Diagnosis not present

## 2022-06-24 DIAGNOSIS — E1122 Type 2 diabetes mellitus with diabetic chronic kidney disease: Secondary | ICD-10-CM | POA: Diagnosis not present

## 2022-06-24 DIAGNOSIS — R748 Abnormal levels of other serum enzymes: Secondary | ICD-10-CM | POA: Diagnosis not present

## 2022-06-24 DIAGNOSIS — E875 Hyperkalemia: Secondary | ICD-10-CM | POA: Diagnosis not present

## 2022-06-24 NOTE — Patient Instructions (Signed)
I am going to order blood work to be performed at [facility] to further evaluate your abnormal liver tests.  We will also recheck your liver tests as well.  We will call with these results.  We may need to proceed with MRI of your liver depending on above blood work.  We would consult with your kidney doctor prior to ordering this.  It was very nice meeting you today.  Dr. Abbey Chatters

## 2022-06-24 NOTE — Progress Notes (Signed)
Primary Care Physician:  Kathyrn Drown, MD Primary Gastroenterologist:  Dr. Abbey Chatters  Chief Complaint  Patient presents with   Elevated Alkaline phosphatase    Patient here today due to an elevated Alkaline phosphatase. Per patient he has mild constipation at times,patient reports he usually takes miralax once per week, and bene fiber bid.    HPI:   Jerry Burns is a 88 y.o. male who presents to the clinic today by referral from his PCP Dr. Wolfgang Phoenix for evaluation for abnormal liver function test.  Patient has historically had mildly elevated alk phos for 2 years though this recently increased significantly.  Most recent alk phos 03/02/2022 238.  AST, ALT, T. bili all WNL.  Subsequent GGT also elevated at 293.  Underwent ultrasound of his liver 03/24/2022 which showed mildly nodular contour, nonspecific raising possibility of cirrhosis.  Also noted nonspecific wall thickening of the gallbladder.  Previously had similar nonspecific gallbladder wall thickening on ultrasound back in May 2021.  Patient denies any abdominal pain.  No itching.  No family history of liver disease.  No chronic alcohol use.  Only new recent medication prior to elevation in alk phos with Eliquis.  Also recently started on Imdur over the last couple weeks.  No recent antibiotic use.  Does note using topical betamethasone for a fungal infection on his foot.  No herbal supplements.    Past Medical History:  Diagnosis Date   Anginal pain ([place])    Aortic atherosclerosis ([place]) 10/06/2020   Seen on CAT scan 2021   Arthritis    Bilateral knee   Atrial fibrillation ([place]) 12/28/2021   On Eliquis   CHF (congestive heart failure) ([place])    Coronary artery disease    Diabetes mellitus    insulin dependent    DVT, lower extremity, distal, acute ([place]) 09/09/2018   Hyperlipidemia    Hypertension    Progressive angina ([name]) 03/2015   a. s/p DES to proximal LAD and balloon angioplasty to distal LAD in 2016 b. patent stent  by repeat cath in 01/2017 with mild residual CAD)   Pulmonary nodule 10/06/2020   Seen on CAT scan 2020 1 repeat CAT scan in 2022   Stroke [facility])     Past Surgical History:  Procedure Laterality Date   APPENDECTOMY     CARDIAC CATHETERIZATION N/A 04/12/2015   Procedure: Left Heart Cath and Coronary Angiography;  Surgeon: Sherren Mocha, MD; pLAD 75>0% w/ 3.0x12 mm Resolute DES, dLAD 80>0% w/ Angiosculpt scoring balloon, CFX luminal irreg, RCA mild dz, EF 55-65%    COLONOSCOPY  2011   RMR: 1. Normal rectum 2. Left sided diverticula , 2 ascending colon polyps, status post snare polypectomy. Remainder of colonic mucosa appeared unremarkable.    COLONOSCOPY N/A 05/13/2016   Procedure: COLONOSCOPY;  Surgeon: Daneil Dolin, MD;  Location: [facility];  Service: Endoscopy;  Laterality: N/A;  7:30 AM   COLONOSCOPY N/A 05/18/2018   R[place]k: Diverticulosis, single tubular adenoma removed.  No plans for future screening or surveillance colonoscopy due to age.   CORONARY ANGIOGRAPHY N/A 02/04/2017   Procedure: CORONARY ANGIOGRAPHY (CATH LAB);  Surgeon: Troy Sine, MD;  Location: [place] [facility] LAB;  Service: Cardiovascular;  Laterality: N/A;   CORONARY ANGIOPLASTY WITH STENT PLACEMENT  08/22/2014   PTCA/DES X mLAD with 2.5 x 16 mm Promus Premier DES by Dr Julianne Handler   ESOPHAGOGASTRODUODENOSCOPY N/A 05/18/2018   [place]: Ulcerative reflux esophagitis, hiatal hernia, erosive gastropathy with H. pylori on  biopsy, also in the setting of NSAID use.   FOOT SURGERY Right    LEFT HEART CATHETERIZATION WITH CORONARY ANGIOGRAM N/A 08/22/2014   Procedure: LEFT HEART CATHETERIZATION WITH CORONARY ANGIOGRAM;  Surgeon: Burnell Blanks, MD;  Location: [facility] CATH LAB;  Service: Cardiovascular;  Laterality: N/A;   PERCUTANEOUS CORONARY STENT INTERVENTION (PCI-S)  08/22/2014   Procedure: PERCUTANEOUS CORONARY STENT INTERVENTION (PCI-S);  Surgeon: Burnell Blanks, MD;    SHOULDER SURGERY     TONSILLECTOMY       Current Outpatient Medications  Medication Sig Dispense Refill   apixaban (ELIQUIS) 2.5 MG TABS tablet Take 1 tablet (2.5 mg total) by mouth 2 (two) times daily. 60 tablet 11   Ascorbic Acid (VITAMIN C) 1000 MG tablet Take 1,000 mg by mouth daily.      atorvastatin (LIPITOR) 80 MG tablet TAKE (1) TABLET BY MOUTH ONCE DAILY. 90 tablet 0   augmented betamethasone dipropionate (DIPROLENE-AF) 0.05 % ointment Apply topically 2 (two) times daily as needed.     B-D UF III MINI PEN NEEDLES 31G X 5 MM MISC SMARTSIG:1 Each SUB-Q Twice Daily     BD PEN NEEDLE NANO 2ND GEN 32G X 4 MM MISC USE 1 PEN NEEDLE FIVE TIMES DAILY     betamethasone dipropionate 0.05 % cream Apply topically 2 (two) times daily.     carvedilol (COREG) 6.25 MG tablet TAKE (1) TABLET BY MOUTHTTWICE DAILY.     COMBIGAN 0.2-0.5 % ophthalmic solution Place 1 drop into the left eye 2 (two) times daily.   2   Continuous Blood Gluc Receiver (DEXCOM G6 RECEIVER) DEVI Use as directed with Dexacom system- Dx E11.9- tests sugars 4 times a day 1 each 0   Continuous Blood Gluc Sensor (DEXCOM G6 SENSOR) MISC Apply as directed to use with Dexacom system Dx E11.9- tests sugars 4 times a day 3 each 1   Continuous Blood Gluc Transmit (DEXCOM G6 TRANSMITTER) MISC Use as directed with dexacom system dx E11.9  tests sugars 4 times a day 1 each 0   Cyanocobalamin (B-12) 2500 MCG TABS Take 1 tablet by mouth 2 (two) times a week. Three times a week on Monday Wednesday and Friday     empagliflozin (JARDIANCE) 10 MG TABS tablet Take 10 mg by mouth daily.     erythromycin ophthalmic ointment Place 1 application  into the left eye 3 (three) times daily as needed (infection).     Flaxseed, Linseed, (FLAXSEED OIL PO) Take 1 capsule by mouth daily.      glucose blood (CONTOUR NEXT TEST) test strip Use to check blood sugars four time daily 400 each 0   insulin glargine (LANTUS SOLOSTAR) 100 UNIT/ML Solostar Pen INJECT 24 TO 25 UNITS SUBCUTANEOUSLY ONCE DAILY AT  10PM. 15 mL 5   insulin lispro (HUMALOG KWIKPEN) 100 UNIT/ML KwikPen INJECT TWICE DAILY PER SLIDING SCALE FOLLOWING BREAKFAST AND DINNER: <85=0UNITS, 85-110=3 UNITS, 111-160 = 5, 161-220 = 7, >221 = 9 UNITS 15 mL 5   isosorbide mononitrate (IMDUR) 30 MG 24 hr tablet Take 0.5 tablets (15 mg total) by mouth daily. 45 tablet 3   JARDIANCE 25 MG TABS tablet Take 25 mg by mouth daily.     ketoconazole (NIZORAL) 2 % cream Apply 1 Application topically 2 (two) times daily. 120 g 4   Lactobacillus (PROBIOTIC ACIDOPHILUS) CAPS Take 1 capsule by mouth daily at 6 (six) AM.     Multiple Vitamin (MULTIVITAMIN) tablet Take 1 tablet by mouth daily.  nitroGLYCERIN (NITROSTAT) 0.4 MG SL tablet PLACE 1 TAB UNDER TONGUE EVERY 5 MIN IF NEEDED FOR CHEST PAIN. MAY USE 3 TIMES.NO RELIEF CALL 911. 25 tablet 0   polyethylene glycol powder (MIRALAX) 17 GM/SCOOP powder Start by taking 1/2 capful mixed in 4 ounces of fluid daily. You can increase to 1 capful daily if needed to maintain easy BMs. 527 g 5   Polyvinyl Alcohol-Povidone (REFRESH OP) Place 1 drop into both ears 2 (two) times daily as needed (dryness).     tamsulosin (FLOMAX) 0.4 MG CAPS capsule Take 2 capsules po at bedtime. if this causes drowsiness or dizziness to reduce it to just 1/day 180 capsule 0   torsemide (DEMADEX) 20 MG tablet TAKE 1 TO 2 TABLETS BY MOUTH EACH MORNING AS DIRECTED FOR SWELLING IN THE LEGS. (Patient taking differently: 20 mg. Take 1 tablet on Monday, Wednesday, Friday and Saturday for swelling in the legs) 60 tablet 6   Turmeric 500 MG CAPS Take 500 mg by mouth daily.     VELTASSA 8.4 g packet Take 1 packet by mouth daily.     No current facility-administered medications for this visit.    Allergies as of 06/24/2022 - Review Complete 06/24/2022  Allergen Reaction Noted   Adhesive [tape]  05/11/2018   Sulfa antibiotics Other (See Comments) 06/04/2015   Latex Other (See Comments) 06/29/2012    Family History  Problem Relation Age  of Onset   Diabetes Other    Diabetes Brother    Colon cancer Maternal Uncle    Skin cancer Mother    Diabetes Father    Hypertension Sister    Skin cancer Sister     Social History   Socioeconomic History   Marital status: Married    Spouse name: Not on file   Number of children: Not on file   Years of education: 14   Highest education level: Not on file  Occupational History   Occupation: retired  Tobacco Use   Smoking status: Former    Packs/day: 1.00    Years: 20.00    Total pack years: 20.00    Types: Cigarettes, Cigars    Start date: 08/10/1955    Quit date: 07/28/2007    Years since quitting: 14.9    Passive exposure: Never   Smokeless tobacco: Never  Vaping Use   Vaping Use: Never used  Substance and Sexual Activity   Alcohol use: Yes    Comment: Daily about 1 glass of wine and gin   Drug use: No   Sexual activity: Not Currently  Other Topics Concern   Not on file  Social History Narrative   Not on file   Social Determinants of Health   Financial Resource Strain: Low Risk  (05/07/2022)   Overall Financial Resource Strain (CARDIA)    Difficulty of Paying Living Expenses: Not hard at all  Food Insecurity: No Food Insecurity (05/07/2022)   Hunger Vital Sign    Worried About Running Out of Food in the Last Year: Never true    [place] in the Last Year: Never true  Transportation Needs: No Transportation Needs (05/07/2022)   PRAPARE - Hydrologist (Medical): No    Lack of Transportation (Non-Medical): No  Physical Activity: Insufficiently Active (05/07/2022)   Exercise Vital Sign    Days of Exercise per Week: 3 days    Minutes of Exercise per Session: 20 min  Stress: No Stress Concern Present (05/07/2022)   [place]  [employer] - Occupational Stress Questionnaire    Feeling of Stress : Only a little  Social Connections: Moderately Isolated (05/07/2022)   Social Connection and Isolation Panel  [NHANES]    Frequency of Communication with Friends and Family: More than three times a week    Frequency of Social Gatherings with Friends and Family: Once a week    Attends Religious Services: Never    Marine scientist or Organizations: No    Attends Archivist Meetings: Never    Marital Status: Married  Human resources officer Violence: Not At Risk (05/07/2022)   Humiliation, Afraid, Rape, and Kick questionnaire    Fear of Current or Ex-Partner: No    Emotionally Abused: No    Physically Abused: No    Sexually Abused: No    Subjective: Review of Systems  Constitutional:  Negative for chills and fever.  HENT:  Negative for congestion and hearing loss.   Eyes:  Negative for blurred vision and double vision.  Respiratory:  Negative for cough and shortness of breath.   Cardiovascular:  Negative for chest pain and palpitations.  Gastrointestinal:  Negative for abdominal pain, blood in stool, constipation, diarrhea, heartburn, melena and vomiting.  Genitourinary:  Negative for dysuria and urgency.  Musculoskeletal:  Negative for joint pain and myalgias.  Skin:  Negative for itching and rash.  Neurological:  Negative for dizziness and headaches.  Psychiatric/Behavioral:  Negative for depression. The patient is not nervous/anxious.        Objective: BP 116/68 (BP Location: Left Arm, Patient Position: Sitting, Cuff Size: Large)   Pulse 68   Temp (!) 97.1 F (36.2 C) (Temporal)   Ht 6' (1.829 m)   Wt 211 lb 12.8 oz (96.1 kg)   BMI 28.73 kg/m  Physical Exam Constitutional:      Appearance: Normal appearance.  HENT:     Head: Normocephalic and atraumatic.  Eyes:     Extraocular Movements: Extraocular movements intact.     Conjunctiva/sclera: Conjunctivae normal.  Cardiovascular:     Rate and Rhythm: Normal rate and regular rhythm.  Pulmonary:     Effort: Pulmonary effort is normal.     Breath sounds: Normal breath sounds.  Abdominal:     General: Bowel sounds  are normal.     Palpations: Abdomen is soft.  Musculoskeletal:        General: Normal range of motion.     Cervical back: Normal range of motion and neck supple.  Skin:    General: Skin is warm.  Neurological:     General: No focal deficit present.     Mental Status: He is alert and oriented to person, place, and time.  Psychiatric:        Mood and Affect: Mood normal.        Behavior: Behavior normal.      Assessment: *Elevated alkaline phosphatase/GGT *Abnormal imaging of liver   Plan: Discussed patient's abnormal liver function tests as well as his abnormal ultrasound in depth today.  Will further evaluate with antimicrobial antibody.  Check CMP and INR as well.  Fortunately his other liver tests are normal.  May need to consider MRI to further evaluate.  Patient is quite hesitant about this as he states a contrasted MRI led to his chronic kidney disease.  We would need to consult with his nephrologist first if we proceed this route.  Follow-up in 3-4 months.  Thank you Dr. Wolfgang Phoenix for the kind referral.  06/24/2022 1:49  PM   Disclaimer: This note was dictated with voice recognition software. Similar sounding words can inadvertently be transcribed and may not be corrected upon review.

## 2022-07-01 ENCOUNTER — Ambulatory Visit: Payer: Medicare Other | Attending: Cardiology | Admitting: Cardiology

## 2022-07-01 ENCOUNTER — Encounter: Payer: Self-pay | Admitting: Cardiology

## 2022-07-01 ENCOUNTER — Ambulatory Visit: Payer: Medicare Other | Admitting: Cardiology

## 2022-07-01 VITALS — BP 126/70 | HR 60 | Ht 72.0 in | Wt 212.8 lb

## 2022-07-01 DIAGNOSIS — N1831 Chronic kidney disease, stage 3a: Secondary | ICD-10-CM | POA: Diagnosis not present

## 2022-07-01 DIAGNOSIS — I10 Essential (primary) hypertension: Secondary | ICD-10-CM | POA: Diagnosis not present

## 2022-07-01 DIAGNOSIS — R932 Abnormal findings on diagnostic imaging of liver and biliary tract: Secondary | ICD-10-CM | POA: Diagnosis not present

## 2022-07-01 DIAGNOSIS — I25118 Atherosclerotic heart disease of native coronary artery with other forms of angina pectoris: Secondary | ICD-10-CM | POA: Insufficient documentation

## 2022-07-01 DIAGNOSIS — R748 Abnormal levels of other serum enzymes: Secondary | ICD-10-CM | POA: Diagnosis not present

## 2022-07-01 DIAGNOSIS — I2510 Atherosclerotic heart disease of native coronary artery without angina pectoris: Secondary | ICD-10-CM

## 2022-07-01 DIAGNOSIS — K769 Liver disease, unspecified: Secondary | ICD-10-CM | POA: Diagnosis not present

## 2022-07-01 DIAGNOSIS — Z794 Long term (current) use of insulin: Secondary | ICD-10-CM | POA: Diagnosis not present

## 2022-07-01 DIAGNOSIS — E1122 Type 2 diabetes mellitus with diabetic chronic kidney disease: Secondary | ICD-10-CM | POA: Diagnosis not present

## 2022-07-01 NOTE — Progress Notes (Signed)
Clinical Summary Jerry Burns is a 88 y.o.male seen today for follow up of the following medical problems.    This is a focused visit on history of CAD with recent chest pain, discuss recent stress test.  1. CAD  - prior DES to prox LAD in 2016, repeat cath 2018 with patent stent    - dull pain left sided, can occur at rest or with activity. 3/10 in severity, some SOB/DOE that is progressing. Lasts about 10 minutes, occurs 3 times a day. No relation to food or eating.     05/2022 nuclear stress: inferior infarct with mild peri-ifnarct. LVEF 48%  -started imdur '15mg'$  daily - symptoms have resolved since starting imdur      Other medical issues not addressed this visit   2. Chronic diastolic HF - 07/8561 echo LVEF 55-60%, normal RV function, indet DDx. Small to mod pericardial effusion     - taking torsemide, controls swelling.    3. Pericardial effusion - resolved   4. CKD 3 - followed by nephrology   5. Afib -noted on recent monitor -no recent palpitaitons.   - compliant with eliquis, no signs of bleeding.    6. Hyperlipidemia - 02/2022 TC 132 TG 104 HDL 48 LDL 65 - he is on atorvastatin '80mg'$  daily.  Past Medical History:  Diagnosis Date   Anginal pain ([place])    Aortic atherosclerosis ([place]) 10/06/2020   Seen on CAT scan 2021   Arthritis    Bilateral knee   Atrial fibrillation ([place]) 12/28/2021   On Eliquis   CHF (congestive heart failure) ([place])    Coronary artery disease    Diabetes mellitus    insulin dependent    DVT, lower extremity, distal, acute ([place]) 09/09/2018   Hyperlipidemia    Hypertension    Progressive angina ([place]) 03/2015   a. s/p DES to proximal LAD and balloon angioplasty to distal LAD in 2016 b. patent stent by repeat cath in 01/2017 with mild residual CAD)   Pulmonary nodule 10/06/2020   Seen on CAT scan 2020 1 repeat CAT scan in 2022   Stroke [facility])      Allergies  Allergen Reactions   Adhesive [Tape]     bandaids cause blisters after  leaving on for 24 hours    Sulfa Antibiotics Other (See Comments)    "was terrible" "eye went crazy"   Latex Other (See Comments)    blisters     Current Outpatient Medications  Medication Sig Dispense Refill   apixaban (ELIQUIS) 2.5 MG TABS tablet Take 1 tablet (2.5 mg total) by mouth 2 (two) times daily. 60 tablet 11   Ascorbic Acid (VITAMIN C) 1000 MG tablet Take 1,000 mg by mouth daily.      atorvastatin (LIPITOR) 80 MG tablet TAKE (1) TABLET BY MOUTH ONCE DAILY. 90 tablet 0   augmented betamethasone dipropionate (DIPROLENE-AF) 0.05 % ointment Apply topically 2 (two) times daily as needed.     B-D UF III MINI PEN NEEDLES 31G X 5 MM MISC SMARTSIG:1 Each SUB-Q Twice Daily     BD PEN NEEDLE NANO 2ND GEN 32G X 4 MM MISC USE 1 PEN NEEDLE FIVE TIMES DAILY     betamethasone dipropionate 0.05 % cream Apply topically 2 (two) times daily.     carvedilol (COREG) 6.25 MG tablet TAKE (1) TABLET BY MOUTHTTWICE DAILY.     COMBIGAN 0.2-0.5 % ophthalmic solution Place 1 drop into the left eye 2 (two) times daily.  2   Continuous Blood Gluc Receiver (DEXCOM G6 RECEIVER) DEVI Use as directed with Dexacom system- Dx E11.9- tests sugars 4 times a day 1 each 0   Continuous Blood Gluc Sensor (DEXCOM G6 SENSOR) MISC Apply as directed to use with Dexacom system Dx E11.9- tests sugars 4 times a day 3 each 1   Continuous Blood Gluc Transmit (DEXCOM G6 TRANSMITTER) MISC Use as directed with dexacom system dx E11.9  tests sugars 4 times a day 1 each 0   Cyanocobalamin (B-12) 2500 MCG TABS Take 1 tablet by mouth 2 (two) times a week. Three times a week on Monday Wednesday and Friday     empagliflozin (JARDIANCE) 10 MG TABS tablet Take 10 mg by mouth daily.     erythromycin ophthalmic ointment Place 1 application  into the left eye 3 (three) times daily as needed (infection).     Flaxseed, Linseed, (FLAXSEED OIL PO) Take 1 capsule by mouth daily.      glucose blood (CONTOUR NEXT TEST) test strip Use to check blood  sugars four time daily 400 each 0   insulin glargine (LANTUS SOLOSTAR) 100 UNIT/ML Solostar Pen INJECT 24 TO 25 UNITS SUBCUTANEOUSLY ONCE DAILY AT 10PM. 15 mL 5   insulin lispro (HUMALOG KWIKPEN) 100 UNIT/ML KwikPen INJECT TWICE DAILY PER SLIDING SCALE FOLLOWING BREAKFAST AND DINNER: <85=0UNITS, 85-110=3 UNITS, 111-160 = 5, 161-220 = 7, >221 = 9 UNITS 15 mL 5   isosorbide mononitrate (IMDUR) 30 MG 24 hr tablet Take 0.5 tablets (15 mg total) by mouth daily. 45 tablet 3   JARDIANCE 25 MG TABS tablet Take 25 mg by mouth daily.     ketoconazole (NIZORAL) 2 % cream Apply 1 Application topically 2 (two) times daily. 120 g 4   Lactobacillus (PROBIOTIC ACIDOPHILUS) CAPS Take 1 capsule by mouth daily at 6 (six) AM.     Multiple Vitamin (MULTIVITAMIN) tablet Take 1 tablet by mouth daily.     nitroGLYCERIN (NITROSTAT) 0.4 MG SL tablet PLACE 1 TAB UNDER TONGUE EVERY 5 MIN IF NEEDED FOR CHEST PAIN. MAY USE 3 TIMES.NO RELIEF CALL 911. 25 tablet 0   polyethylene glycol powder (MIRALAX) 17 GM/SCOOP powder Start by taking 1/2 capful mixed in 4 ounces of fluid daily. You can increase to 1 capful daily if needed to maintain easy BMs. 527 g 5   Polyvinyl Alcohol-Povidone (REFRESH OP) Place 1 drop into both ears 2 (two) times daily as needed (dryness).     tamsulosin (FLOMAX) 0.4 MG CAPS capsule Take 2 capsules po at bedtime. if this causes drowsiness or dizziness to reduce it to just 1/day 180 capsule 0   torsemide (DEMADEX) 20 MG tablet TAKE 1 TO 2 TABLETS BY MOUTH EACH MORNING AS DIRECTED FOR SWELLING IN THE LEGS. (Patient taking differently: 20 mg. Take 1 tablet on Monday, Wednesday, Friday and Saturday for swelling in the legs) 60 tablet 6   Turmeric 500 MG CAPS Take 500 mg by mouth daily.     VELTASSA 8.4 g packet Take 1 packet by mouth daily.     No current facility-administered medications for this visit.     Past Surgical History:  Procedure Laterality Date   APPENDECTOMY     CARDIAC CATHETERIZATION N/A  04/12/2015   Procedure: Left Heart Cath and Coronary Angiography;  Surgeon: Sherren Mocha, MD; pLAD 75>0% w/ 3.0x12 mm Resolute DES, dLAD 80>0% w/ Angiosculpt scoring balloon, CFX luminal irreg, RCA mild dz, EF 55-65%    COLONOSCOPY  2011   RMR:  1. Normal rectum 2. Left sided diverticula , 2 ascending colon polyps, status post snare polypectomy. Remainder of colonic mucosa appeared unremarkable.    COLONOSCOPY N/A 05/13/2016   Procedure: COLONOSCOPY;  Surgeon: Daneil Dolin, MD;  Location: [facility];  Service: Endoscopy;  Laterality: N/A;  7:30 AM   COLONOSCOPY N/A 05/18/2018   [place]k: Diverticulosis, single tubular adenoma removed.  No plans for future screening or surveillance colonoscopy due to age.   CORONARY ANGIOGRAPHY N/A 02/04/2017   Procedure: CORONARY ANGIOGRAPHY (CATH LAB);  Surgeon: Troy Sine, MD;  Location: [facility];  Service: Cardiovascular;  Laterality: N/A;   CORONARY ANGIOPLASTY WITH STENT PLACEMENT  08/22/2014   PTCA/DES X mLAD with 2.5 x 16 mm Promus Premier DES by Dr Julianne Handler   ESOPHAGOGASTRODUODENOSCOPY N/A 05/18/2018   [place]k: Ulcerative reflux esophagitis, hiatal hernia, erosive gastropathy with H. pylori on biopsy, also in the setting of NSAID use.   FOOT SURGERY Right    LEFT HEART CATHETERIZATION WITH CORONARY ANGIOGRAM N/A 08/22/2014   Procedure: LEFT HEART CATHETERIZATION WITH CORONARY ANGIOGRAM;  Surgeon: Burnell Blanks, MD;  Location: [facility] CATH LAB;  Service: Cardiovascular;  Laterality: N/A;   PERCUTANEOUS CORONARY STENT INTERVENTION (PCI-S)  08/22/2014   Procedure: PERCUTANEOUS CORONARY STENT INTERVENTION (PCI-S);  Surgeon: Burnell Blanks, MD;    SHOULDER SURGERY     TONSILLECTOMY       Allergies  Allergen Reactions   Adhesive [Tape]     bandaids cause blisters after leaving on for 24 hours    Sulfa Antibiotics Other (See Comments)    "was terrible" "eye went crazy"   Latex Other (See Comments)    blisters      Family  History  Problem Relation Age of Onset   Diabetes Other    Diabetes Brother    Colon cancer Maternal Uncle    Skin cancer Mother    Diabetes Father    Hypertension Sister    Skin cancer Sister      Social History Jerry Burns reports that he quit smoking about 14 years ago. His smoking use included cigarettes and cigars. He started smoking about 66 years ago. He has a 20.00 pack-year smoking history. He has never been exposed to tobacco smoke. He has never used smokeless tobacco. Jerry Burns reports current alcohol use.   Review of Systems CONSTITUTIONAL: No weight loss, fever, chills, weakness or fatigue.  HEENT: Eyes: No visual loss, blurred vision, double vision or yellow sclerae.No hearing loss, sneezing, congestion, runny nose or sore throat.  SKIN: No rash or itching.  CARDIOVASCULAR: per hpi RESPIRATORY: No shortness of breath, cough or sputum.  GASTROINTESTINAL: No anorexia, nausea, vomiting or diarrhea. No abdominal pain or blood.  GENITOURINARY: No burning on urination, no polyuria NEUROLOGICAL: No headache, dizziness, syncope, paralysis, ataxia, numbness or tingling in the extremities. No change in bowel or bladder control.  MUSCULOSKELETAL: No muscle, back pain, joint pain or stiffness.  LYMPHATICS: No enlarged nodes. No history of splenectomy.  PSYCHIATRIC: No history of depression or anxiety.  ENDOCRINOLOGIC: No reports of sweating, cold or heat intolerance. No polyuria or polydipsia.  Marland Kitchen   Physical Examination Vitals:   07/01/22 1143  BP: 126/70  Pulse: 60  SpO2: 97%   Filed Weights   07/01/22 1143  Weight: 212 lb 12.8 oz (96.5 kg)    Gen: resting comfortably, no acute distress HEENT: no scleral icterus, pupils equal round and reactive, no palptable cervical adenopathy,  CV: irreg, no m/r/g no jvd  Resp: Clear to auscultation bilaterally GI: abdomen is soft, non-tender, non-distended, normal bowel sounds, no hepatosplenomegaly MSK: extremities are warm, no  edema.  Skin: warm, no rash Neuro:  no focal deficits Psych: appropriate affect   Diagnostic Studies Echocardiogram 12/15/19:   1. Left ventricular ejection fraction, by estimation, is 55%. The left  ventricle has normal function. The left ventricle demonstrates regional  wall motion abnormalities (see scoring diagram/findings for description).  Left ventricular diastolic  parameters are indeterminate.   2. Right ventricular systolic function is normal. The right ventricular  size is normal. There is mildly elevated pulmonary artery systolic  pressure. The estimated right ventricular systolic pressure is 01.7 mmHg.   3. Left atrial size was moderately dilated.   4. Moderate pericardial effusion. The pericardial effusion is  circumferential with anterior predominance. There is evidence of  organization and potential chronicity. No clear right ventricular  compromise or diagnostic variation in mitral outflow to  suggest tamponade physiology.   5. The mitral valve is grossly normal. Mild mitral valve regurgitation.   6. The aortic valve is tricuspid. Aortic valve regurgitation is not  visualized.   7. The inferior vena cava is dilated in size with <50% respiratory  variability, suggesting right atrial pressure of 15 mmHg.    NST: 12/2016 There was no ST segment deviation noted during stress. Findings consistent with prior inferior/inferoseptal/inferoapical myocardial infarction with moderate peri-infarct ischemia. This is an intermediate risk study. The left ventricular ejection fraction is normal (55-65%).   Cardiac Catheterization: 01/2017 Prox RCA lesion, 15 %stenosed. Dist RCA lesion, 20 %stenosed. 1st Diag lesion, 20 %stenosed. Ost LAD-1 lesion, 40 %stenosed. Ost LAD-2 lesion, 0 %stenosed. Dist LAD-1 lesion, 30 %stenosed. Dist LAD-2 lesion, 15 %stenosed.   Mild residual CAD with a patent proximal LAD stent and 30-40% narrowing immediately proximal to the stented segment,  20% proximal diagonal stenosis, 30% mid LAD stenosis, patent distal LAD stent with 15% intimal hyperplasia; normal left circumflex system; mild 20% proximal and distal stenoses in a dominant RCA.   RECOMMENDATION: Medical therapy.  Recommend echo Doppler study for LV function assessment since despite prolonged attempt at crossing the aortic valve with a catheter due to the significant tortuosity and left ventriculography was not able to be performed during this procedure.   11/2021 monitor 14 day monitor Afib throughout the study with min HR 46, Max HR 119, Avg HR 73 Frequent ventricular ectopy as PVCs (8.9%). Rare couplets and triplets. Two runs of NSVT longest 4 beats No symptoms reported     Patch Wear Time:  13 days and 16 hours (2023-04-24T14:06:58-0400 to 2023-05-08T06:35:47-0400)   2 Ventricular Tachycardia runs occurred, the run with the fastest interval lasting 4 beats with a max rate of 193 bpm, the longest lasting 4 beats with an avg rate of 125 bpm. Atrial Flutter occurred continuously (100% burden), ranging from 46-119 bpm  (avg of 73 bpm). Isolated VEs were frequent (8.9%, [id number]), VE Couplets were rare (<1.0%, 3002), and VE Triplets were rare (<1.0%, 275). Ventricular Bigeminy and Trigeminy were present.    05/2022 nuclear stress:    Findings are consistent with prior inferior myocardial infarction with mild peri-infarct ischemia.   No ST deviation was noted.   LV perfusion is abnormal. There is a moderate size moderate intensity inferior/inferoseptal basal to apical defect with mild reversibility.   Left ventricular function is abnormal. Nuclear stress EF: 48 %. The left ventricular ejection fraction is mildly decreased (45-54%). End diastolic cavity size is normal.  Low to intermediate risk study based on combination of mild peri-infarct ischemia and mild LV dysfunction.   Assessment and Plan  1. CAD with other forms of angina - some recent chest pain unclear etiology,  mixed in characteristics - lexiscan suggesting inferior infarct mild peri-infarct ischemia - started on imdur with resolution of symptoms, continue to monitor at this time. Room to titrate imdur further if needed - with CKD overall high threshold to consider cath. Given low risk nuclear findings and response to antianginal therapy no cath indications at this time   F/u 6 months      Arnoldo Lenis, M.D.

## 2022-07-01 NOTE — Patient Instructions (Signed)
Medication Instructions:  Your physician recommends that you continue on your current medications as directed. Please refer to the Current Medication list given to you today.   Labwork: None  Testing/Procedures: None  Follow-Up: Follow up with Dr. Branch in 6 months.   Any Other Special Instructions Will Be Listed Below (If Applicable).     If you need a refill on your cardiac medications before your next appointment, please call your pharmacy.  

## 2022-07-02 LAB — HEPATIC FUNCTION PANEL
ALT: 25 IU/L (ref 0–44)
AST: 29 IU/L (ref 0–40)
Albumin: 4.5 g/dL (ref 3.7–4.7)
Alkaline Phosphatase: 230 IU/L — ABNORMAL HIGH (ref 44–121)
Bilirubin Total: 1 mg/dL (ref 0.0–1.2)
Bilirubin, Direct: 0.45 mg/dL — ABNORMAL HIGH (ref 0.00–0.40)
Total Protein: 7.5 g/dL (ref 6.0–8.5)

## 2022-07-02 LAB — COMPREHENSIVE METABOLIC PANEL
ALT: 28 IU/L (ref 0–44)
AST: 30 IU/L (ref 0–40)
Albumin/Globulin Ratio: 1.6 (ref 1.2–2.2)
Albumin: 4.6 g/dL (ref 3.7–4.7)
Alkaline Phosphatase: 233 IU/L — ABNORMAL HIGH (ref 44–121)
BUN/Creatinine Ratio: 34 — ABNORMAL HIGH (ref 10–24)
BUN: 54 mg/dL — ABNORMAL HIGH (ref 8–27)
Bilirubin Total: 1 mg/dL (ref 0.0–1.2)
CO2: 23 mmol/L (ref 20–29)
Calcium: 9.9 mg/dL (ref 8.6–10.2)
Chloride: 103 mmol/L (ref 96–106)
Creatinine, Ser: 1.58 mg/dL — ABNORMAL HIGH (ref 0.76–1.27)
Globulin, Total: 2.9 g/dL (ref 1.5–4.5)
Glucose: 179 mg/dL — ABNORMAL HIGH (ref 70–99)
Potassium: 4.6 mmol/L (ref 3.5–5.2)
Sodium: 142 mmol/L (ref 134–144)
Total Protein: 7.5 g/dL (ref 6.0–8.5)
eGFR: 42 mL/min/{1.73_m2} — ABNORMAL LOW (ref >59)

## 2022-07-02 LAB — PROTIME-INR
INR: 1.1 (ref 0.9–1.2)
Prothrombin Time: 11.9 s (ref 9.1–12.0)

## 2022-07-02 LAB — HEMOGLOBIN A1C
Est. average glucose Bld gHb Est-mCnc: 163 mg/dL
Hgb A1c MFr Bld: 7.3 % — ABNORMAL HIGH (ref 4.8–5.6)

## 2022-07-02 LAB — MITOCHONDRIAL ANTIBODIES: Mitochondrial Ab: <20 Units (ref 0.0–20.0)

## 2022-07-06 ENCOUNTER — Telehealth: Payer: Self-pay | Admitting: Family Medicine

## 2022-07-06 NOTE — Telephone Encounter (Signed)
Nurses Please connect with patient Please verify with patient how many times per day he checks his sugar readings?  Also for Medicare purposes we will need him to bring by his glucose readings for [place] to make a copy of so we can scan these into the chart

## 2022-07-07 ENCOUNTER — Telehealth: Payer: Self-pay

## 2022-07-07 ENCOUNTER — Inpatient Hospital Stay: Payer: Medicare Other | Attending: Physician Assistant

## 2022-07-07 DIAGNOSIS — I10 Essential (primary) hypertension: Secondary | ICD-10-CM | POA: Diagnosis not present

## 2022-07-07 DIAGNOSIS — K769 Liver disease, unspecified: Secondary | ICD-10-CM | POA: Insufficient documentation

## 2022-07-07 DIAGNOSIS — Z808 Family history of malignant neoplasm of other organs or systems: Secondary | ICD-10-CM | POA: Diagnosis not present

## 2022-07-07 DIAGNOSIS — Z79899 Other long term (current) drug therapy: Secondary | ICD-10-CM | POA: Insufficient documentation

## 2022-07-07 DIAGNOSIS — D631 Anemia in chronic kidney disease: Secondary | ICD-10-CM | POA: Diagnosis not present

## 2022-07-07 DIAGNOSIS — D509 Iron deficiency anemia, unspecified: Secondary | ICD-10-CM | POA: Insufficient documentation

## 2022-07-07 DIAGNOSIS — I509 Heart failure, unspecified: Secondary | ICD-10-CM | POA: Diagnosis not present

## 2022-07-07 DIAGNOSIS — N189 Chronic kidney disease, unspecified: Secondary | ICD-10-CM | POA: Diagnosis not present

## 2022-07-07 DIAGNOSIS — Z8673 Personal history of transient ischemic attack (TIA), and cerebral infarction without residual deficits: Secondary | ICD-10-CM | POA: Insufficient documentation

## 2022-07-07 DIAGNOSIS — Z87891 Personal history of nicotine dependence: Secondary | ICD-10-CM | POA: Diagnosis not present

## 2022-07-07 DIAGNOSIS — Z800 Family history of malignant neoplasm of digestive organs: Secondary | ICD-10-CM | POA: Diagnosis not present

## 2022-07-07 DIAGNOSIS — I2510 Atherosclerotic heart disease of native coronary artery without angina pectoris: Secondary | ICD-10-CM | POA: Insufficient documentation

## 2022-07-07 DIAGNOSIS — I82409 Acute embolism and thrombosis of unspecified deep veins of unspecified lower extremity: Secondary | ICD-10-CM | POA: Insufficient documentation

## 2022-07-07 DIAGNOSIS — Z7901 Long term (current) use of anticoagulants: Secondary | ICD-10-CM | POA: Diagnosis not present

## 2022-07-07 DIAGNOSIS — E119 Type 2 diabetes mellitus without complications: Secondary | ICD-10-CM | POA: Insufficient documentation

## 2022-07-07 DIAGNOSIS — D649 Anemia, unspecified: Secondary | ICD-10-CM

## 2022-07-07 DIAGNOSIS — I480 Paroxysmal atrial fibrillation: Secondary | ICD-10-CM | POA: Diagnosis not present

## 2022-07-07 LAB — CBC WITH DIFFERENTIAL/PLATELET
Abs Immature Granulocytes: 0.02 10*3/uL (ref 0.00–0.07)
Basophils Absolute: 0.1 10*3/uL (ref 0.0–0.1)
Basophils Relative: 1 %
Eosinophils Absolute: 0.3 10*3/uL (ref 0.0–0.5)
Eosinophils Relative: 5 %
HCT: 38.2 % — ABNORMAL LOW (ref 39.0–52.0)
Hemoglobin: 12.5 g/dL — ABNORMAL LOW (ref 13.0–17.0)
Immature Granulocytes: 0 %
Lymphocytes Relative: 17 %
Lymphs Abs: 1 10*3/uL (ref 0.7–4.0)
MCH: 31.6 pg (ref 26.0–34.0)
MCHC: 32.7 g/dL (ref 30.0–36.0)
MCV: 96.7 fL (ref 80.0–100.0)
Monocytes Absolute: 0.7 10*3/uL (ref 0.1–1.0)
Monocytes Relative: 12 %
Neutro Abs: 3.9 10*3/uL (ref 1.7–7.7)
Neutrophils Relative %: 65 %
Platelets: 150 10*3/uL (ref 150–400)
RBC: 3.95 MIL/uL — ABNORMAL LOW (ref 4.22–5.81)
RDW: 14.3 % (ref 11.5–15.5)
WBC: 6.1 10*3/uL (ref 4.0–10.5)
nRBC: 0 % (ref 0.0–0.2)

## 2022-07-07 LAB — COMPREHENSIVE METABOLIC PANEL
ALT: 31 U/L (ref 0–44)
AST: 29 U/L (ref 15–41)
Albumin: 3.9 g/dL (ref 3.5–5.0)
Alkaline Phosphatase: 176 U/L — ABNORMAL HIGH (ref 38–126)
Anion gap: 11 (ref 5–15)
BUN: 51 mg/dL — ABNORMAL HIGH (ref 8–23)
CO2: 22 mmol/L (ref 22–32)
Calcium: 9.9 mg/dL (ref 8.9–10.3)
Chloride: 109 mmol/L (ref 98–111)
Creatinine, Ser: 1.45 mg/dL — ABNORMAL HIGH (ref 0.61–1.24)
GFR, Estimated: 47 mL/min — ABNORMAL LOW (ref >60)
Glucose, Bld: 70 mg/dL (ref 70–99)
Potassium: 4.1 mmol/L (ref 3.5–5.1)
Sodium: 142 mmol/L (ref 135–145)
Total Bilirubin: 1 mg/dL (ref 0.3–1.2)
Total Protein: 7.7 g/dL (ref 6.5–8.1)

## 2022-07-07 LAB — IRON AND TIBC
Iron: 79 ug/dL (ref 45–182)
Saturation Ratios: 22 % (ref 17.9–39.5)
TIBC: 352 ug/dL (ref 250–450)
UIBC: 273 ug/dL

## 2022-07-07 LAB — FERRITIN: Ferritin: 164 ng/mL (ref 24–336)

## 2022-07-07 NOTE — Telephone Encounter (Signed)
Pt responded via my chart. Please advise. Thank you  I am checking my glucose 5 times each day.  I buy additional as needed. I will bring my readings into your office.  Sorry, I forgot to bring them with me to my last appointment with Dr. Nicki Reaper. Thanks, Adah Salvage

## 2022-07-07 NOTE — Telephone Encounter (Signed)
Mychart message sent to patient.

## 2022-07-07 NOTE — Telephone Encounter (Signed)
I would recommend for the patient that he try to check his sugar 3 times daily-if he feels he needs to check his sugar 4 times per day he can do so but Medicare guidelines typically will pay just for 3 times daily.-5 times daily he is in my opinion excessive.  Also has to consider continuous glucose monitoring?  Dexcom or freestyle libre-we discussed this at 1 time

## 2022-07-07 NOTE — Telephone Encounter (Signed)
Pt called regarding his blood work being completed and he also stated "what could he do in order to get his blood results back on track" with his alkaline phosphatase levels. Please advise.

## 2022-07-08 NOTE — Telephone Encounter (Signed)
Mychart message sent to patient.

## 2022-07-08 NOTE — Telephone Encounter (Signed)
Pt replied via [place] morning, Meira [place].  Thanks.  I have a cgm.  I tried to set it up but could not get the app.  I want to get it correct.  My telephone may be too old.  I expect a new one today and will try again. Thanks, Adah Salvage

## 2022-07-11 ENCOUNTER — Other Ambulatory Visit: Payer: Self-pay | Admitting: Family Medicine

## 2022-07-13 NOTE — Progress Notes (Unsigned)
[place], [place] [address]   CLINIC:  Medical Oncology/Hematology  PCP:  Kathyrn Drown, MD [address] [place] [address] [phone]   REASON FOR VISIT:  Follow-up for iron deficiency anemia  PRIOR THERAPY: None  CURRENT THERAPY: Intermittent Feraheme (last on 10/13/2019); oral iron supplementation  INTERVAL HISTORY:  Jerry Burns 88 y.o. male returns for routine follow-up of his iron deficiency anemia.  He was last seen on 07/16/2021 by Tarri Abernethy PA-C.  His last IV iron infusion was on 10/13/2019.  At today's visit, he reports feeling fair.  He denies any bleeding such as hematemesis, epistaxis, hematochezia, or melena.  He reports significant progressive fatigue over the past 6 months, reports that he now has to stop halfway up his flight of stairs (16 steps) at home, is walking slower, and feels unsteady on his feet.  He reports that he was diagnosed with atrial fibrillation and started on Eliquis during the past year.  He had some intermittent chest pain that resolved after being started on Imdur by his cardiologist.  He denies any ice pica. He remains on vitamin B12 and ferrous sulfate 325 mg every other day along with stool softener.  He does not have any symptoms of recurrent DVT or PE.  He has 75% energy and 90% appetite. He endorses that he is maintaining a stable weight.   REVIEW OF SYSTEMS:  Review of Systems  Constitutional:  Positive for fatigue. Negative for appetite change, chills, diaphoresis, fever and unexpected weight change.  HENT:   Negative for lump/mass and nosebleeds.   Eyes:  Negative for eye problems.  Respiratory:  Negative for cough, hemoptysis and shortness of breath.   Cardiovascular:  Negative for chest pain, leg swelling and palpitations.  Gastrointestinal:  Positive for constipation. Negative for abdominal pain, blood in stool, diarrhea, nausea and vomiting.  Genitourinary:  Negative  for hematuria.   Skin: Negative.   Neurological:  Positive for headaches and light-headedness. Negative for dizziness.  Hematological:  Does not bruise/bleed easily.      PAST MEDICAL/SURGICAL HISTORY:  Past Medical History:  Diagnosis Date   Anginal pain ([place])    Aortic atherosclerosis ([place]) 10/06/2020   Seen on CAT scan 2021   Arthritis    Bilateral knee   Atrial fibrillation ([place]) 12/28/2021   On Eliquis   CHF (congestive heart failure) ([place])    Coronary artery disease    Diabetes mellitus    insulin dependent    DVT, lower extremity, distal, acute ([place]) 09/09/2018   Hyperlipidemia    Hypertension    Progressive angina ([place]) 03/2015   a. s/p DES to proximal LAD and balloon angioplasty to distal LAD in 2016 b. patent stent by repeat cath in 01/2017 with mild residual CAD)   Pulmonary nodule 10/06/2020   Seen on CAT scan 2020 1 repeat CAT scan in 2022   Stroke [facility])    Past Surgical History:  Procedure Laterality Date   APPENDECTOMY     CARDIAC CATHETERIZATION N/A 04/12/2015   Procedure: Left Heart Cath and Coronary Angiography;  Surgeon: Sherren Mocha, MD; pLAD 75>0% w/ 3.0x12 mm Resolute DES, dLAD 80>0% w/ Angiosculpt scoring balloon, CFX luminal irreg, RCA mild dz, EF 55-65%    COLONOSCOPY  2011   RMR: 1. Normal rectum 2. Left sided diverticula , 2 ascending colon polyps, status post snare polypectomy. Remainder of colonic mucosa appeared unremarkable.    COLONOSCOPY N/A 05/13/2016   Procedure:  COLONOSCOPY;  Surgeon: Daneil Dolin, MD;  Location[facility];  Service: Endoscopy;  Laterality: N/A;  7:30 AM   COLONOSCOPY N/A 05/18/2018   [place]k: Diverticulosis, single tubular adenoma removed.  No plans for future screening or surveillance colonoscopy due to age.   CORONARY ANGIOGRAPHY N/A 02/04/2017   Procedure: CORONARY ANGIOGRAPHY (CATH LAB);  Surgeon: Troy Sine, MD;  Location: [facility];  Service: Cardiovascular;  Laterality: N/A;   CORONARY ANGIOPLASTY  WITH STENT PLACEMENT  08/22/2014   PTCA/DES X mLAD with 2.5 x 16 mm Promus Premier DES by Dr Julianne Handler   ESOPHAGOGASTRODUODENOSCOPY N/A 05/18/2018   [place]: Ulcerative reflux esophagitis, hiatal hernia, erosive gastropathy with H. pylori on biopsy, also in the setting of NSAID use.   FOOT SURGERY Right    LEFT HEART CATHETERIZATION WITH CORONARY ANGIOGRAM N/A 08/22/2014   Procedure: LEFT HEART CATHETERIZATION WITH CORONARY ANGIOGRAM;  Surgeon: Burnell Blanks, MD;  Location: [facility] CATH LAB;  Service: Cardiovascular;  Laterality: N/A;   PERCUTANEOUS CORONARY STENT INTERVENTION (PCI-S)  08/22/2014   Procedure: PERCUTANEOUS CORONARY STENT INTERVENTION (PCI-S);  Surgeon: Burnell Blanks, MD;    SHOULDER SURGERY     TONSILLECTOMY       SOCIAL HISTORY:  Social History   Socioeconomic History   Marital status: Married    Spouse name: Not on file   Number of children: Not on file   Years of education: 14   Highest education level: Not on file  Occupational History   Occupation: retired  Tobacco Use   Smoking status: Former    Packs/day: 1.00    Years: 20.00    Total pack years: 20.00    Types: Cigarettes, Cigars    Start date: 08/10/1955    Quit date: 07/28/2007    Years since quitting: 14.9    Passive exposure: Never   Smokeless tobacco: Never  Vaping Use   Vaping Use: Never used  Substance and Sexual Activity   Alcohol use: Yes    Comment: Daily about 1 glass of wine and gin   Drug use: No   Sexual activity: Not Currently  Other Topics Concern   Not on file  Social History Narrative   Not on file   Social Determinants of Health   Financial Resource Strain: Low Risk  (05/07/2022)   Overall Financial Resource Strain (CARDIA)    Difficulty of Paying Living Expenses: Not hard at all  Food Insecurity: No Food Insecurity (05/07/2022)   Hunger Vital Sign    Worried About Running Out of Food in the Last Year: Never true    [place] in the Last Year: Never true   Transportation Needs: No Transportation Needs (05/07/2022)   PRAPARE - Hydrologist (Medical): No    Lack of Transportation (Non-Medical): No  Physical Activity: Insufficiently Active (05/07/2022)   Exercise Vital Sign    Days of Exercise per Week: 3 days    Minutes of Exercise per Session: 20 min  Stress: No Stress Concern Present (05/07/2022)   [place]    Feeling of Stress : Only a little  Social Connections: Moderately Isolated (05/07/2022)   Social Connection and Isolation Panel [NHANES]    Frequency of Communication with Friends and Family: More than three times a week    Frequency of Social Gatherings with Friends and Family: Once a week    Attends Religious Services: Never    Active  Member of Clubs or Organizations: No    Attends Archivist Meetings: Never    Marital Status: Married  Human resources officer Violence: Not At Risk (05/07/2022)   Humiliation, Afraid, Rape, and Kick questionnaire    Fear of Current or Ex-Partner: No    Emotionally Abused: No    Physically Abused: No    Sexually Abused: No    FAMILY HISTORY:  Family History  Problem Relation Age of Onset   Diabetes Other    Diabetes Brother    Colon cancer Maternal Uncle    Skin cancer Mother    Diabetes Father    Hypertension Sister    Skin cancer Sister     CURRENT MEDICATIONS:  Outpatient Encounter Medications as of 07/14/2022  Medication Sig   apixaban (ELIQUIS) 2.5 MG TABS tablet Take 1 tablet (2.5 mg total) by mouth 2 (two) times daily.   Ascorbic Acid (VITAMIN C) 1000 MG tablet Take 1,000 mg by mouth daily.    atorvastatin (LIPITOR) 80 MG tablet TAKE (1) TABLET BY MOUTH ONCE DAILY.   augmented betamethasone dipropionate (DIPROLENE-AF) 0.05 % ointment Apply topically 2 (two) times daily as needed.   B-D UF III MINI PEN NEEDLES 31G X 5 MM MISC SMARTSIG:1 Each SUB-Q Twice Daily   BD PEN NEEDLE  NANO 2ND GEN 32G X 4 MM MISC USE 1 PEN NEEDLE FIVE TIMES DAILY   betamethasone dipropionate 0.05 % cream Apply topically 2 (two) times daily.   carvedilol (COREG) 6.25 MG tablet TAKE (1) TABLET BY MOUTHTTWICE DAILY.   COMBIGAN 0.2-0.5 % ophthalmic solution Place 1 drop into the left eye 2 (two) times daily.    Continuous Blood Gluc Receiver (DEXCOM G6 RECEIVER) DEVI Use as directed with Dexacom system- Dx E11.9- tests sugars 4 times a day   Continuous Blood Gluc Sensor (DEXCOM G6 SENSOR) MISC Apply as directed to use with Dexacom system Dx E11.9- tests sugars 4 times a day   Continuous Blood Gluc Transmit (DEXCOM G6 TRANSMITTER) MISC Use as directed with dexacom system dx E11.9  tests sugars 4 times a day   Cyanocobalamin (B-12) 2500 MCG TABS Take 1 tablet by mouth 2 (two) times a week. Three times a week on Monday Wednesday and Friday   empagliflozin (JARDIANCE) 10 MG TABS tablet Take 10 mg by mouth daily.   erythromycin ophthalmic ointment Place 1 application  into the left eye 3 (three) times daily as needed (infection).   Flaxseed, Linseed, (FLAXSEED OIL PO) Take 1 capsule by mouth daily.    glucose blood (CONTOUR NEXT TEST) test strip Use to check blood sugars four time daily   insulin glargine (LANTUS SOLOSTAR) 100 UNIT/ML Solostar Pen INJECT 24 TO 25 UNITS SUBCUTANEOUSLY ONCE DAILY AT 10PM.   insulin lispro (HUMALOG KWIKPEN) 100 UNIT/ML KwikPen INJECT TWICE DAILY PER SLIDING SCALE FOLLOWING BREAKFAST AND DINNER: <85=0UNITS, 85-110=3 UNITS, 111-160 = 5, 161-220 = 7, >221 = 9 UNITS   isosorbide mononitrate (IMDUR) 30 MG 24 hr tablet Take 0.5 tablets (15 mg total) by mouth daily.   JARDIANCE 25 MG TABS tablet Take 25 mg by mouth daily.   ketoconazole (NIZORAL) 2 % cream Apply 1 Application topically 2 (two) times daily.   Lactobacillus (PROBIOTIC ACIDOPHILUS) CAPS Take 1 capsule by mouth daily at 6 (six) AM.   Multiple Vitamin (MULTIVITAMIN) tablet Take 1 tablet by mouth daily.   nitroGLYCERIN  (NITROSTAT) 0.4 MG SL tablet PLACE 1 TAB UNDER TONGUE EVERY 5 MIN IF NEEDED FOR CHEST PAIN. MAY USE  3 TIMES.NO RELIEF CALL 911.   polyethylene glycol powder (MIRALAX) 17 GM/SCOOP powder Start by taking 1/2 capful mixed in 4 ounces of fluid daily. You can increase to 1 capful daily if needed to maintain easy BMs.   Polyvinyl Alcohol-Povidone (REFRESH OP) Place 1 drop into both ears 2 (two) times daily as needed (dryness).   tamsulosin (FLOMAX) 0.4 MG CAPS capsule Take 2 capsules po at bedtime. if this causes drowsiness or dizziness to reduce it to just 1/day   torsemide (DEMADEX) 20 MG tablet TAKE 1 TO 2 TABLETS BY MOUTH EACH MORNING AS DIRECTED FOR SWELLING IN THE LEGS. (Patient taking differently: 20 mg. Take 1 tablet on Monday, Wednesday, Friday and Saturday for swelling in the legs)   Turmeric 500 MG CAPS Take 500 mg by mouth daily.   VELTASSA 8.4 g packet Take 1 packet by mouth daily.   No facility-administered encounter medications on file as of 07/14/2022.    ALLERGIES:  Allergies  Allergen Reactions   Adhesive [Tape]     bandaids cause blisters after leaving on for 24 hours    Sulfa Antibiotics Other (See Comments)    "was terrible" "eye went crazy"   Latex Other (See Comments)    blisters     PHYSICAL EXAM:  ECOG PERFORMANCE STATUS: 1 - Symptomatic but completely ambulatory  There were no vitals filed for this visit. There were no vitals filed for this visit. Physical Exam Constitutional:      Appearance: Normal appearance. He is obese.  HENT:     Head: Normocephalic and atraumatic.     Mouth/Throat:     Mouth: Mucous membranes are moist.  Eyes:     Extraocular Movements: Extraocular movements intact.     Pupils: Pupils are equal, round, and reactive to light.  Cardiovascular:     Rate and Rhythm: Normal rate and regular rhythm.     Pulses: Normal pulses.     Heart sounds: Normal heart sounds.  Pulmonary:     Effort: Pulmonary effort is normal.     Breath sounds:  Normal breath sounds.  Abdominal:     General: Bowel sounds are normal.     Palpations: Abdomen is soft.     Tenderness: There is no abdominal tenderness.  Musculoskeletal:        General: No swelling.     Right lower leg: No edema.     Left lower leg: Edema (trace) present.  Lymphadenopathy:     Cervical: No cervical adenopathy.  Skin:    General: Skin is warm and dry.  Neurological:     General: No focal deficit present.     Mental Status: He is alert and oriented to person, place, and time.  Psychiatric:        Mood and Affect: Mood normal.        Behavior: Behavior normal.     LABORATORY DATA:  I have reviewed the labs as listed.  CBC    Component Value Date/Time   WBC 6.1 07/07/2022 1030   RBC 3.95 (L) 07/07/2022 1030   HGB 12.5 (L) 07/07/2022 1030   HGB 12.9 (L) 12/31/2021 0849   HCT 38.2 (L) 07/07/2022 1030   HCT 39.4 12/31/2021 0849   PLT 150 07/07/2022 1030   PLT 145 (L) 12/31/2021 0849   MCV 96.7 07/07/2022 1030   MCV 95 12/31/2021 0849   MCH 31.6 07/07/2022 1030   MCHC 32.7 07/07/2022 1030   RDW 14.3 07/07/2022 1030   RDW 12.4  12/31/2021 0849   LYMPHSABS 1.0 07/07/2022 1030   LYMPHSABS 0.8 12/11/2019 1037   MONOABS 0.7 07/07/2022 1030   EOSABS 0.3 07/07/2022 1030   EOSABS 0.3 12/11/2019 1037   BASOSABS 0.1 07/07/2022 1030   BASOSABS 0.1 12/11/2019 1037      Latest Ref Rng & Units 07/07/2022   10:30 AM 07/01/2022   11:18 AM 07/01/2022   11:17 AM  CMP  Glucose 70 - 99 mg/dL 70  179    BUN 8 - 23 mg/dL 51  54    Creatinine 0.61 - 1.24 mg/dL 1.45  1.58    Sodium 135 - 145 mmol/L 142  142    Potassium 3.5 - 5.1 mmol/L 4.1  4.6    Chloride 98 - 111 mmol/L 109  103    CO2 22 - 32 mmol/L 22  23    Calcium 8.9 - 10.3 mg/dL 9.9  9.9    Total Protein 6.5 - 8.1 g/dL 7.7  7.5  7.5   Total Bilirubin 0.3 - 1.2 mg/dL 1.0  1.0  1.0   Alkaline Phos 38 - 126 U/L 176  233  230   AST 15 - 41 U/L '29  30  29   '$ ALT 0 - 44 U/L '31  28  25     '$ DIAGNOSTIC IMAGING:   I have independently reviewed the relevant imaging and discussed with the patient.  ASSESSMENT & PLAN: 1.   Normocytic anemia from CKD and iron deficiency - Thought to be from GI blood loss and anemia of CKD (CKD stage IIIa) - He had a hemoglobin of 9.7 in September 2019 followed by a GI work-up. - He had an EGD on 05/18/2018 showed erosive reflux esophagitis, erosive gastropathy.  Biopsy was consistent with chronic active gastritis with H. pylori.  It was thought that he could have been bleeding from upper GI. - Colonoscopy on 05/18/2018 showed diverticulosis of the sigmoid colon, descending colon and transverse colon.  Tubular adenoma from hepatic flexure was resected. - During his initial work up, stool for occult blood x3 was negative.  Direct Coombs test and LDH were normal.  M46 and folic acid were normal.  SPEP normal. - Last Feraheme on 10/06/2019 and 10/13/2019. - He is taking oral ferrous sulfate 3 times per week and is tolerating it well.  Also takes vitamin B12 supplements at home. - No bright red blood per rectum or melena - He has chronic fatigue, which he reports is slightly improved from previous - Most recent labs (07/07/2022): Hgb 12.5/MCV 96.7, platelets 150, ferritin 164, iron saturation 22%.  CMP shows creatinine 1.45/GFR 47. - PLAN: Patient has been stable without any need for IV iron supplementation for >2 years.  Therefore recommend tentative discharge to PCP, but patient can certainly follow-up as needed if he has any recurrent iron deficiency anemia or other hematologic concerns. - Continue oral iron supplementation. - Patient educated on alarm symptoms that would warrant more immediate medical attention or return visit.  2.  Distal DVT: - He had an ultrasound on 09/03/2018 showed possible nonocclusive thrombus of the intermediate acuity within 1 of the posterior tibial veins in the right calf. - A CT scan of the chest PE protocol could not be done as he is unable to lie  flat at that time - He completed Eliquis x6 months (restarted Eliquis in 2023 due to new diagnosis of A-fib) - No current signs or symptoms of recurrent DVT   3.  Liver abnormalities -  Recently seen for GI consultation due to elevated LFTs and some nodularity of the liver on recent ultrasound that was suspicious for possible cirrhosis.  4.  Other history - PMH includes coronary artery disease, paroxysmal atrial fibrillation ([place]), congestive heart failure, diabetes, hypertension, stroke   PLAN SUMMARY & DISPOSITION: >> Labs in 1 year (CBC/D, CMP, ferritin, iron/TIBC, B12, MMA) >> Office visit 1 week after labs   All questions were answered. The patient knows to call the clinic with any problems, questions or concerns.  Medical decision making: Low  Time spent on visit: I spent 15 minutes counseling the patient face to face. The total time spent in the appointment was 22 minutes and more than 50% was on counseling.   Harriett Rush, PA-C  07/14/22 11:18 AM

## 2022-07-14 ENCOUNTER — Other Ambulatory Visit: Payer: Self-pay

## 2022-07-14 ENCOUNTER — Encounter: Payer: Self-pay | Admitting: Physician Assistant

## 2022-07-14 ENCOUNTER — Inpatient Hospital Stay (HOSPITAL_BASED_OUTPATIENT_CLINIC_OR_DEPARTMENT_OTHER): Payer: Medicare Other | Admitting: Physician Assistant

## 2022-07-14 VITALS — BP 137/71 | HR 52 | Temp 97.9°F | Resp 17 | Ht 72.0 in | Wt 214.9 lb

## 2022-07-14 DIAGNOSIS — D649 Anemia, unspecified: Secondary | ICD-10-CM

## 2022-07-14 DIAGNOSIS — D631 Anemia in chronic kidney disease: Secondary | ICD-10-CM | POA: Diagnosis not present

## 2022-07-14 DIAGNOSIS — I82409 Acute embolism and thrombosis of unspecified deep veins of unspecified lower extremity: Secondary | ICD-10-CM | POA: Diagnosis not present

## 2022-07-14 DIAGNOSIS — N189 Chronic kidney disease, unspecified: Secondary | ICD-10-CM | POA: Diagnosis not present

## 2022-07-14 DIAGNOSIS — D509 Iron deficiency anemia, unspecified: Secondary | ICD-10-CM | POA: Diagnosis not present

## 2022-07-14 DIAGNOSIS — I2510 Atherosclerotic heart disease of native coronary artery without angina pectoris: Secondary | ICD-10-CM | POA: Diagnosis not present

## 2022-07-14 DIAGNOSIS — N1831 Chronic kidney disease, stage 3a: Secondary | ICD-10-CM

## 2022-07-14 DIAGNOSIS — K769 Liver disease, unspecified: Secondary | ICD-10-CM | POA: Diagnosis not present

## 2022-07-14 NOTE — Patient Instructions (Signed)
[place] at [facility] Discharge Instructions  You were seen today by Tarri Abernethy PA-C for your iron deficiency anemia.  Your blood and iron levels looks great today!  We will check labs and see you for an office visit in 1 year.  In the meantime, continue to take your iron supplement 3 times per week.   Thank you for choosing [place] at [facility] to provide your oncology and hematology care.  To afford each patient quality time with our provider, please arrive at least 15 minutes before your scheduled appointment time.   If you have a lab appointment with the [place] please come in thru the Main Entrance and check in at the main information desk.  You need to re-schedule your appointment should you arrive 10 or more minutes late.  We strive to give you quality time with our providers, and arriving late affects you and other patients whose appointments are after yours.  Also, if you no show three or more times for appointments you may be dismissed from the clinic at the providers discretion.     Again, thank you for choosing [facility].  Our hope is that these requests will decrease the amount of time that you wait before being seen by our physicians.       _____________________________________________________________  Should you have questions after your visit to [facility], please contact our office at [phone] and follow the prompts.  Our office hours are 8:00 a.m. and 4:30 p.m. Monday - Friday.  Please note that voicemails left after 4:00 p.m. may not be returned until the following business day.  We are closed weekends and major holidays.  You do have access to a nurse 24-7, just call the main number to the clinic [phone] and do not press any options, hold on the line and a nurse will answer the phone.    For prescription refill requests, have your pharmacy contact our office and allow 72  hours.    Due to Covid, you will need to wear a mask upon entering the hospital. If you do not have a mask, a mask will be given to you at the Main Entrance upon arrival. For doctor visits, patients may have 1 support person age 63 or older with them. For treatment visits, patients can not have anyone with them due to social distancing guidelines and our immunocompromised population.

## 2022-07-31 ENCOUNTER — Encounter: Payer: Self-pay | Admitting: Orthopedic Surgery

## 2022-07-31 ENCOUNTER — Ambulatory Visit (INDEPENDENT_AMBULATORY_CARE_PROVIDER_SITE_OTHER): Payer: Medicare Other

## 2022-07-31 ENCOUNTER — Ambulatory Visit (INDEPENDENT_AMBULATORY_CARE_PROVIDER_SITE_OTHER): Payer: Medicare Other | Admitting: Orthopedic Surgery

## 2022-07-31 VITALS — BP 122/67 | HR 75 | Ht 72.0 in | Wt 217.4 lb

## 2022-07-31 DIAGNOSIS — M1711 Unilateral primary osteoarthritis, right knee: Secondary | ICD-10-CM | POA: Diagnosis not present

## 2022-07-31 DIAGNOSIS — G8929 Other chronic pain: Secondary | ICD-10-CM | POA: Diagnosis not present

## 2022-07-31 DIAGNOSIS — M25561 Pain in right knee: Secondary | ICD-10-CM

## 2022-07-31 MED ORDER — METHYLPREDNISOLONE ACETATE 40 MG/ML IJ SUSP
40.0000 mg | Freq: Once | INTRAMUSCULAR | Status: AC
  Administered 2022-07-31: 40 mg via INTRA_ARTICULAR

## 2022-07-31 NOTE — Addendum Note (Signed)
Addended by: Obie Dredge A on: 07/31/2022 01:41 PM   Modules accepted: Orders

## 2022-07-31 NOTE — Patient Instructions (Signed)
Recommend Tylenol 500 mg every 6 hours as needed  Also topical medication such as capsaicin cream or [place] will help apply as needed  Injection was given to help with pain  Because you are on Eliquis did not use anti-inflammatories on a long-term for frequent basis those include medication such as Aleve and ibuprofen  Helpful hints:

## 2022-07-31 NOTE — Progress Notes (Signed)
Chief Complaint  Patient presents with   New Patient (Initial Visit)   Knee Pain    RT knee pain/ off and on for years but pain started increasing ~6wks ago   This is an 89 year old male last seen in May 2019 for right knee pain.  At that time we injected his knee and he did well until about 6 weeks ago although he had intermittent symptoms on and off over the 6-year.  No catching locking giving way no mechanical symptoms pain is over the medial joint line primarily on the tibia  Review of systems only related to joint pain  Medication list and problem list are long so I will refer you to that for evaluation and review he is diabetic he is on Eliquis  Listed is CKD stage IV hemoglobin A1c 7.3 on 07/01/2022  We also see exertional angina cardiovascular disease peripheral edema history of pleural effusion  Allergies to adhesive sulfa and latex  BP 122/67   Pulse 75   Ht 6' (1.829 m)   Wt 217 lb 6.4 oz (98.6 kg)   BMI 29.48 kg/m   Physical Exam Vitals and nursing note reviewed.  Constitutional:      Appearance: Normal appearance.  HENT:     Head: Normocephalic and atraumatic.  Eyes:     General: No scleral icterus.       Right eye: No discharge.        Left eye: No discharge.     Extraocular Movements: Extraocular movements intact.     Conjunctiva/sclera: Conjunctivae normal.     Pupils: Pupils are equal, round, and reactive to light.  Cardiovascular:     Rate and Rhythm: Normal rate.     Pulses: Normal pulses.  Musculoskeletal:     Right lower leg: Edema present.     Left lower leg: Edema present.     Comments: As far as the knee goes no effusion, full extension full flexion good stability negative McMurray's  Tenderness over the medial joint line at the tibia none on the femur none around the patella strength normal in extension and flexion  Skin:    General: Skin is warm and dry.     Capillary Refill: Capillary refill takes less than 2 seconds.     Comments: Skin  changes noted from probable peripheral edema in addition to some reddened areas which are chronic  Neurological:     General: No focal deficit present.     Mental Status: He is alert and oriented to person, place, and time.  Psychiatric:        Mood and Affect: Mood normal.        Behavior: Behavior normal.        Thought Content: Thought content normal.        Judgment: Judgment normal.     Imaging was obtained of the right knee  X-rays show narrowing of the medial and lateral compartments with normal patellofemoral joint he does not have a lot of secondary bone changes his alignment shows varus   Encounter Diagnoses  Name Primary?   Chronic pain of right knee    Primary osteoarthritis of right knee Yes    I would definitely classify this as a chronic situation worse and uncontrolled at the present time  Our goal is for him to be relatively pain-free and with ability to perform activities of daily living and activities which according to him without loss of function   Based on his age, symptoms, Eliquis  recommend injection right knee  Tylenol 500 every 6 for pain  Topical medications as well  See [place] as needed   Procedure note right knee injection   verbal consent was obtained to inject right knee joint  Timeout was completed to confirm the site of injection  The medications used were depomedrol 40 mg and 1% lidocaine 3 cc Anesthesia was provided by ethyl chloride and the skin was prepped with alcohol.  After cleaning the skin with alcohol a 20-gauge needle was used to inject the right knee joint. There were no complications. A sterile bandage was applied.

## 2022-08-10 ENCOUNTER — Other Ambulatory Visit: Payer: Self-pay | Admitting: Family Medicine

## 2022-08-12 ENCOUNTER — Encounter: Payer: Self-pay | Admitting: *Deleted

## 2022-08-20 ENCOUNTER — Other Ambulatory Visit: Payer: Self-pay | Admitting: Student

## 2022-08-29 ENCOUNTER — Other Ambulatory Visit: Payer: Self-pay | Admitting: Family Medicine

## 2022-08-29 DIAGNOSIS — N1831 Chronic kidney disease, stage 3a: Secondary | ICD-10-CM

## 2022-08-31 ENCOUNTER — Encounter: Payer: Self-pay | Admitting: Gastroenterology

## 2022-09-01 ENCOUNTER — Telehealth: Payer: Self-pay

## 2022-09-01 NOTE — Telephone Encounter (Signed)
Pt will need to be seen. Please contact pt to have him set up appt. Thank you!

## 2022-09-01 NOTE — Telephone Encounter (Signed)
Jerry Burns said he has had sore throat for a month that is not getting better he is taking mucinex and cold and flu med not a lot of congestion but still not improving is there any thing he can do or take he is only complaining for sore throat.   Pt call back [phone] Adah Salvage

## 2022-09-02 ENCOUNTER — Encounter (HOSPITAL_COMMUNITY): Payer: Self-pay | Admitting: Hematology

## 2022-09-07 ENCOUNTER — Ambulatory Visit (INDEPENDENT_AMBULATORY_CARE_PROVIDER_SITE_OTHER): Payer: Medicare Other | Admitting: Family Medicine

## 2022-09-07 VITALS — BP 118/70 | HR 67 | Wt 214.4 lb

## 2022-09-07 DIAGNOSIS — R070 Pain in throat: Secondary | ICD-10-CM

## 2022-09-07 NOTE — Progress Notes (Signed)
   Subjective:    Patient ID: Jerry Burns, male    DOB: 1934/07/15, 89 y.o.   MRN: [id number]  HPI  Patient arrives sore throat. Patient states it has been going on for month. Patient has been treating it with OTC allergy and Cold and flu medication. Patient relates soreness right side of his throat when he swallows.  Denies fever chills sweats   Review of Systems     Objective:   Physical Exam Lungs are Lear neck no masses throat no erythema eardrums normal       Assessment & Plan:  It is possible that his throat pain is related to reflux but it is also possible that it could be something going on within the larynx I do not feel it has anything to do with his thyroid we will have him go ahead and be seen by ENT for a laryngeal evaluation keep all regular follow-up visits with [place]

## 2022-09-17 ENCOUNTER — Telehealth: Payer: Self-pay | Admitting: Family Medicine

## 2022-09-17 NOTE — Telephone Encounter (Signed)
Nurses Please communicate with patient Medicare is now requiring documentation for glucose strips 1.  Please talk with patient and find out how often is he checking his sugars currently? 2.  We will need a copy of his most recent glucose readings over the past few weeks to see Test application to get his strips He needs to bring this by the office we will make a copy of it Not only file a copy in his chart but also send a copy with this along with the most recent office visit  Appointment for the patient hemorrhage times a day see checking If he says 5 times please let him know with the new Medicare guidelines they only cover 3 times per day for individuals who are on insulin we will try to get it approved for 4 times but we will not be able to get it approved for 5 times a day Also 4 times a day or less should be adequate (Essentially checking his sugar with meals and at bedtime is more than adequate)  Notes he provide see additional information please forward back to me so I can complete his form

## 2022-09-18 NOTE — Telephone Encounter (Signed)
Mychart message sent to patient.

## 2022-09-18 NOTE — Telephone Encounter (Signed)
Pt replied via my chart and will bring reading in on Monday.

## 2022-09-18 NOTE — Telephone Encounter (Signed)
Form at nurse station

## 2022-09-23 ENCOUNTER — Other Ambulatory Visit: Payer: Self-pay | Admitting: Family Medicine

## 2022-09-24 ENCOUNTER — Encounter: Payer: Self-pay | Admitting: Radiology

## 2022-09-26 ENCOUNTER — Other Ambulatory Visit: Payer: Self-pay | Admitting: Family Medicine

## 2022-09-27 NOTE — Telephone Encounter (Signed)
Hi, this was forwarded and placed on your desk thank you

## 2022-09-29 NOTE — Telephone Encounter (Signed)
Form faxed to Pharmacy

## 2022-10-09 ENCOUNTER — Telehealth: Payer: Self-pay | Admitting: *Deleted

## 2022-10-09 ENCOUNTER — Other Ambulatory Visit: Payer: Self-pay

## 2022-10-09 DIAGNOSIS — E1122 Type 2 diabetes mellitus with diabetic chronic kidney disease: Secondary | ICD-10-CM

## 2022-10-09 DIAGNOSIS — I10 Essential (primary) hypertension: Secondary | ICD-10-CM

## 2022-10-09 NOTE — Telephone Encounter (Signed)
Mychart message sent to patient.

## 2022-10-09 NOTE — Telephone Encounter (Signed)
Recommend metabolic 7, 123456, urine ACR Diabetes hypertension

## 2022-10-09 NOTE — Telephone Encounter (Signed)
Patient would like blood work orders placed for his appt next week - would like to do labs Monday  Last labs 06/2022: Lipid, CMP, HgbA1c, CBC- also has future labs ordered thru hematology

## 2022-10-12 DIAGNOSIS — E1122 Type 2 diabetes mellitus with diabetic chronic kidney disease: Secondary | ICD-10-CM | POA: Diagnosis not present

## 2022-10-12 DIAGNOSIS — Z794 Long term (current) use of insulin: Secondary | ICD-10-CM | POA: Diagnosis not present

## 2022-10-12 DIAGNOSIS — I10 Essential (primary) hypertension: Secondary | ICD-10-CM | POA: Diagnosis not present

## 2022-10-12 DIAGNOSIS — N1831 Chronic kidney disease, stage 3a: Secondary | ICD-10-CM | POA: Diagnosis not present

## 2022-10-13 LAB — MICROALBUMIN / CREATININE URINE RATIO
Creatinine, Urine: 31.9 mg/dL
Microalb/Creat Ratio: 248 mg/g creat — ABNORMAL HIGH (ref 0–29)
Microalbumin, Urine: 79.2 ug/mL

## 2022-10-13 LAB — HEMOGLOBIN A1C
Est. average glucose Bld gHb Est-mCnc: 174 mg/dL
Hgb A1c MFr Bld: 7.7 % — ABNORMAL HIGH (ref 4.8–5.6)

## 2022-10-13 LAB — BASIC METABOLIC PANEL
BUN/Creatinine Ratio: 24 (ref 10–24)
BUN: 36 mg/dL — ABNORMAL HIGH (ref 8–27)
CO2: 21 mmol/L (ref 20–29)
Calcium: 9.8 mg/dL (ref 8.6–10.2)
Chloride: 102 mmol/L (ref 96–106)
Creatinine, Ser: 1.52 mg/dL — ABNORMAL HIGH (ref 0.76–1.27)
Glucose: 122 mg/dL — ABNORMAL HIGH (ref 70–99)
Potassium: 4.8 mmol/L (ref 3.5–5.2)
Sodium: 137 mmol/L (ref 134–144)
eGFR: 44 mL/min/{1.73_m2} — ABNORMAL LOW (ref >59)

## 2022-10-14 ENCOUNTER — Encounter (HOSPITAL_COMMUNITY): Payer: Self-pay | Admitting: Hematology

## 2022-10-14 ENCOUNTER — Ambulatory Visit (INDEPENDENT_AMBULATORY_CARE_PROVIDER_SITE_OTHER): Payer: Medicare Other | Admitting: Family Medicine

## 2022-10-14 VITALS — BP 96/43 | Ht 72.0 in | Wt 215.0 lb

## 2022-10-14 DIAGNOSIS — D6869 Other thrombophilia: Secondary | ICD-10-CM | POA: Diagnosis not present

## 2022-10-14 DIAGNOSIS — E1122 Type 2 diabetes mellitus with diabetic chronic kidney disease: Secondary | ICD-10-CM | POA: Diagnosis not present

## 2022-10-14 DIAGNOSIS — E1140 Type 2 diabetes mellitus with diabetic neuropathy, unspecified: Secondary | ICD-10-CM | POA: Diagnosis not present

## 2022-10-14 DIAGNOSIS — N1832 Chronic kidney disease, stage 3b: Secondary | ICD-10-CM | POA: Diagnosis not present

## 2022-10-14 DIAGNOSIS — Z794 Long term (current) use of insulin: Secondary | ICD-10-CM

## 2022-10-14 DIAGNOSIS — I5032 Chronic diastolic (congestive) heart failure: Secondary | ICD-10-CM | POA: Diagnosis not present

## 2022-10-14 DIAGNOSIS — I4891 Unspecified atrial fibrillation: Secondary | ICD-10-CM | POA: Diagnosis not present

## 2022-10-14 DIAGNOSIS — I10 Essential (primary) hypertension: Secondary | ICD-10-CM | POA: Diagnosis not present

## 2022-10-14 DIAGNOSIS — I25118 Atherosclerotic heart disease of native coronary artery with other forms of angina pectoris: Secondary | ICD-10-CM | POA: Diagnosis not present

## 2022-10-14 MED ORDER — CARVEDILOL 3.125 MG PO TABS: 3.1250 mg | ORAL_TABLET | Freq: Two times a day (BID) | ORAL | 3 refills | Status: DC

## 2022-10-14 NOTE — Progress Notes (Signed)
   Subjective:    Patient ID: Jerry Burns, male    DOB: [birth year], 89 y.o.   MRN: [id number]  Hypertension This is a chronic problem. The current episode started more than 1 year ago. Risk factors for coronary artery disease include diabetes mellitus, dyslipidemia and male gender. Treatments tried: torsemide, imdur, coreg.   Discuss recent labs Patient stated he has been having problems with dizziness for last 2 weeks PHQ-9 reviewed see Patient finds himself feeling fatigued tired feeling rundown.  Blood pressure on the low end We reviewed over his medicines Carvedilol along with Imdur could be causing some of the issues   Results for orders placed or performed in visit on [id number]  Basic Metabolic Panel  Result Value Ref Range   Glucose 122 (H) 70 - 99 mg/dL   BUN 36 (H) 8 - 27 mg/dL   Creatinine, Ser 1.52 (H) 0.76 - 1.27 mg/dL   eGFR 44 (L) >59 mL/min/1.73   BUN/Creatinine Ratio 24 10 - 24   Sodium 137 134 - 144 mmol/L   Potassium 4.8 3.5 - 5.2 mmol/L   Chloride 102 96 - 106 mmol/L   CO2 21 20 - 29 mmol/L   Calcium 9.8 8.6 - 10.2 mg/dL  Hemoglobin A1c  Result Value Ref Range   Hgb A1c MFr Bld 7.7 (H) 4.8 - 5.6 %   Est. average glucose Bld gHb Est-mCnc 174 mg/dL  Microalbumin/Creatinine Ratio, Urine  Result Value Ref Range   Creatinine, Urine 31.9 Not Estab. mg/dL   Microalbumin, Urine 79.2 Not Estab. ug/mL   Microalb/Creat Ratio 248 (H) 0 - 29 mg/g creat    Review of Systems     Objective:   Physical Exam  General-in no acute distress Eyes-no discharge Lungs-respiratory rate normal, CTA CV-no murmurs,RRR Extremities skin warm dry mild edema Neuro grossly normal Behavior normal, alert  He does have some edema in the ankle region but not severe     Assessment & Plan:  1. Essential hypertension Blood pressure is on the low end.  Reduce the carvedilol 3.125 twice daily follow-up again in 4 weeks Continue Imdur as it is  2. Painful diabetic neuropathy  (HCC) We did discuss potential for adding medication to try to help this but will hold off on this until blood pressure is more stabilized potential Lyrica  3. Type 2 diabetes mellitus with stage 3b chronic kidney disease, with long-term current use of insulin ([place]) Patient was taking her short acting insulin in the middle of the night when he got up to check his wife I told him to stop doing this and only utilize it with breakfast lunch and dinner he will send [place] some readings within a few weeks time how this is doing then we can make reasonable adjustments he will use 20 units of long-acting insulin in the evening  4. Acquired thrombophilia ([place]) No bleeding issues currently.  Previous lab work looks good  5. Chronic diastolic heart failure (HCC) Heart failure under decent control currently the edema of the legs not severe enough to adjust medicine kidney functions overall look good  6. Atrial fibrillation, unspecified type ([place]) Heart rate is controlled no setbacks noted  7. Coronary artery disease involving native coronary artery of native heart with other form of angina pectoris ([place]) Denies any chest pressure pain or discomfort but the medication is causing low blood pressure which is causing dizziness.  See adjustments above

## 2022-10-16 ENCOUNTER — Encounter: Payer: Self-pay | Admitting: Gastroenterology

## 2022-10-16 ENCOUNTER — Ambulatory Visit (INDEPENDENT_AMBULATORY_CARE_PROVIDER_SITE_OTHER): Payer: Medicare Other | Admitting: Gastroenterology

## 2022-10-16 VITALS — BP 133/79 | HR 67 | Temp 97.6°F | Ht 72.0 in | Wt 215.8 lb

## 2022-10-16 DIAGNOSIS — R7989 Other specified abnormal findings of blood chemistry: Secondary | ICD-10-CM | POA: Insufficient documentation

## 2022-10-16 NOTE — Progress Notes (Signed)
Patient's appt was scheduled for 10:00 am. He left at 10:13 am stating he had to get to another appointment.

## 2022-10-23 ENCOUNTER — Ambulatory Visit: Payer: Medicare Other | Admitting: Gastroenterology

## 2022-11-06 ENCOUNTER — Other Ambulatory Visit: Payer: Self-pay | Admitting: Physician Assistant

## 2022-11-06 ENCOUNTER — Other Ambulatory Visit: Payer: Self-pay | Admitting: Family Medicine

## 2022-11-06 DIAGNOSIS — I4891 Unspecified atrial fibrillation: Secondary | ICD-10-CM

## 2022-11-06 NOTE — Telephone Encounter (Signed)
Prescription refill request for Eliquis received. Indication:Afib  Last office visit: 07/01/22 ([place]) Scr: 1.52 (10/12/22) Age: 89 Weight: 97.9kg  Appropriate dose. Refill sent.

## 2022-11-10 ENCOUNTER — Telehealth: Payer: Self-pay | Admitting: Family Medicine

## 2022-11-10 NOTE — Telephone Encounter (Signed)
Lipid liver Hyperlipidemia and high risk med

## 2022-11-10 NOTE — Telephone Encounter (Addendum)
Patient thought he needed blood work before his appt tomorrow. He went yesterday but there was no order. If he needs blood work please send him a Clinical cytogeneticist message when the order has been placed. I did move him from 11/11/2022 to May 1.

## 2022-11-11 ENCOUNTER — Ambulatory Visit: Payer: Medicare Other | Admitting: Family Medicine

## 2022-11-13 ENCOUNTER — Other Ambulatory Visit: Payer: Self-pay

## 2022-11-13 DIAGNOSIS — E1169 Type 2 diabetes mellitus with other specified complication: Secondary | ICD-10-CM

## 2022-11-13 DIAGNOSIS — E785 Hyperlipidemia, unspecified: Secondary | ICD-10-CM

## 2022-11-13 DIAGNOSIS — Z79899 Other long term (current) drug therapy: Secondary | ICD-10-CM

## 2022-11-13 NOTE — Telephone Encounter (Signed)
Orders placed for labs and informed patient .

## 2022-11-16 DIAGNOSIS — E1169 Type 2 diabetes mellitus with other specified complication: Secondary | ICD-10-CM | POA: Diagnosis not present

## 2022-11-16 DIAGNOSIS — E785 Hyperlipidemia, unspecified: Secondary | ICD-10-CM | POA: Diagnosis not present

## 2022-11-16 DIAGNOSIS — Z79899 Other long term (current) drug therapy: Secondary | ICD-10-CM | POA: Diagnosis not present

## 2022-11-17 LAB — LIPID PANEL
Chol/HDL Ratio: 2.7 ratio (ref 0.0–5.0)
Cholesterol, Total: 129 mg/dL (ref 100–199)
HDL: 48 mg/dL (ref >39)
LDL Chol Calc (NIH): 64 mg/dL (ref 0–99)
Triglycerides: 91 mg/dL (ref 0–149)
VLDL Cholesterol Cal: 17 mg/dL (ref 5–40)

## 2022-11-17 LAB — HEPATIC FUNCTION PANEL
ALT: 24 IU/L (ref 0–44)
AST: 32 IU/L (ref 0–40)
Albumin: 4.2 g/dL (ref 3.7–4.7)
Alkaline Phosphatase: 226 IU/L — ABNORMAL HIGH (ref 44–121)
Bilirubin Total: 1 mg/dL (ref 0.0–1.2)
Bilirubin, Direct: 0.45 mg/dL — ABNORMAL HIGH (ref 0.00–0.40)
Total Protein: 7.5 g/dL (ref 6.0–8.5)

## 2022-11-25 ENCOUNTER — Ambulatory Visit (INDEPENDENT_AMBULATORY_CARE_PROVIDER_SITE_OTHER): Payer: Medicare Other | Admitting: Family Medicine

## 2022-11-25 VITALS — BP 114/70 | Ht 72.0 in | Wt 212.6 lb

## 2022-11-25 DIAGNOSIS — Z794 Long term (current) use of insulin: Secondary | ICD-10-CM

## 2022-11-25 DIAGNOSIS — E1122 Type 2 diabetes mellitus with diabetic chronic kidney disease: Secondary | ICD-10-CM | POA: Diagnosis not present

## 2022-11-25 DIAGNOSIS — M79672 Pain in left foot: Secondary | ICD-10-CM | POA: Diagnosis not present

## 2022-11-25 DIAGNOSIS — N1832 Chronic kidney disease, stage 3b: Secondary | ICD-10-CM | POA: Diagnosis not present

## 2022-11-25 DIAGNOSIS — M79671 Pain in right foot: Secondary | ICD-10-CM | POA: Diagnosis not present

## 2022-11-25 NOTE — Patient Instructions (Signed)
21 on the long-acting insulin each evening  Please send [place] an update on the readings in approximately 10 to 14 days  Please do your blood work toward the end of June with a follow-up office visit early July  Our staff will help set up with you consultation with clinical pharmacist we will call you with the appointment

## 2022-11-25 NOTE — Progress Notes (Signed)
   Subjective:    Patient ID: Jerry Burns, male    DOB: 03/16/1934, 89 y.o.   MRN: [id number]  HPI  Patient arrives for a follow up on sugar. Patient states his sugar readings are all over the place and it has him feeling stressed. Very nice patient He has had sugars that have been elevated He checks his sugars 4 times per day Interesting side note-patient relates he eats breakfast around 1 or 2 AM because he gets up every morning in the middle the night to help his wife who at that time states she needs something to eat to help her stomach so he goes ahead and eats then and he states often his sugar is low from his evening Lantus but he is also doing the short acting insulin in the evening as well right at suppertime  Patient states the calloses on his feet are bothering him and preventing him for walking like he wants to.  Review of Systems     Objective:   Physical Exam General-in no acute distress Eyes-no discharge Lungs-respiratory rate normal, CTA CV-no murmurs,RRR Extremities skin warm dry no edema Neuro grossly normal Behavior normal, alert   Previous A1c actually look good He is not having any dangerous low sugars but he does have some drop into the 70s I think is partly because of his unusual eating pattern but we will reduce the long-acting insulin     Assessment & Plan:  1. Type 2 diabetes mellitus with stage 3b chronic kidney disease, with long-term current use of insulin (HCC) Diabetes subpar control He has a Dexcom at home He is currently in the process of getting a new phone so his family can help him download the app onto his new phone he hopes to have this within the next 10 days He is requesting consultation with clinical pharmacist regarding operation of the Dexcom In the meantime we will maintain insulin but at 21 units in the evening long-acting would be what I would recommend not 23.  He may continue to use a sliding scale but I told him breakfast  needs to be around 5 AM not 1 AM.  In addition to this he states he eats lunch somewhere around 10:30 AM and supper around 5 PM. He will send [place] a new reading somewhere within the course of the next several weeks he will keep his follow-up visit within 6 weeks - AMB Referral to [facility] ([facility] Patients)  2. Foot pain, bilateral Referral to podiatry because he has painful calluses - Ambulatory referral to Podiatry

## 2022-11-25 NOTE — Progress Notes (Signed)
Cardiology Office Note    Date:  11/26/2022  ID:  Jerry Burns, DOB 1934-02-29, MRN [id number] Cardiologist: Dina Rich, MD    History of Present Illness:    Jerry Burns is a 89 y.o. male with past medical history of CAD (s/p DES to proximal LAD and balloon angioplasty to distal LAD in 2016, patent stent by repeat cath in 01/2017 with mild residual CAD, NST in 05/2022 showing mild peri-infarct ischemia and medical management pursued), paroxysmal atrial fibrillation, HFpEF, HTN, HLD, IDDM, Stage 3 CKD and prior CVA who presents to the office today for 59-month follow-up.   He was last examined by Dr. Wyline Mood in 06/2022 and had previously been having episodes of chest discomfort with recent NST showing mild peri-infarct ischemia and medical management had been recommended given his age and CKD. Since being started on Imdur 15 mg daily, his symptoms had resolved. It was recommended if he had recurrent symptoms, would titrate Imdur as would have a high threshold to consider cardiac catheterization given his CKD.  In talking with the patient today, he reports overall doing well since his last office visit. He reports that his episodes of chest discomfort did resolve after being started on Imdur 15 mg daily. His activity has been limited over the past several years but he remains the primary caregiver for his wife who is mostly bedbound. He denies any recurrent chest pain or recent palpitations. Reports his breathing has overall been stable with no specific orthopnea or PND. He does take Torsemide approximately every other day for lower extremity edema. Reports intermittent dizziness and he is unsure if this is worse on the days that he takes Torsemide.  Studies Reviewed:   EKG: EKG is not ordered today.  Echocardiogram: 12/2020 IMPRESSIONS     1. Left ventricular ejection fraction, by estimation, is 55 to 60%. The  left ventricle has normal function. The left ventricle has no regional   wall motion abnormalities. Left ventricular diastolic parameters are  indeterminate in the setting of apparent  atrial fibrillation.   2. Right ventricular systolic function is normal. The right ventricular  size is normal. Tricuspid regurgitation signal is inadequate for assessing  PA pressure.   3. Left atrial size was moderately dilated.   4. The mitral valve is grossly normal. Mild mitral valve regurgitation.   5. The aortic valve is tricuspid. Aortic valve regurgitation is not  visualized.   6. The inferior vena cava is dilated in size with >50% respiratory  variability, suggesting right atrial pressure of 8 mmHg.   7. Pericardial effusion has resolved.   Event Monitor: 11/2021 14 day monitor Afib throughout the study with min HR 46, Max HR 119, Avg HR 73 Frequent ventricular ectopy as PVCs (8.9%). Rare couplets and triplets. Two runs of NSVT longest 4 beats No symptoms reported     Patch Wear Time:  13 days and 16 hours (2023-04-24T14:06:58-0400 to 2023-05-08T06:35:47-0400)   2 Ventricular Tachycardia runs occurred, the run with the fastest interval lasting 4 beats with a max rate of 193 bpm, the longest lasting 4 beats with an avg rate of 125 bpm. Atrial Flutter occurred continuously (100% burden), ranging from 46-119 bpm  (avg of 73 bpm). Isolated VEs were frequent (8.9%, [id number]), VE Couplets were rare (<1.0%, 3002), and VE Triplets were rare (<1.0%, 275). Ventricular Bigeminy and Trigeminy were present.   NST: 05/2022   Findings are consistent with prior inferior myocardial infarction with mild peri-infarct ischemia.   No ST  deviation was noted.   LV perfusion is abnormal. There is a moderate size moderate intensity inferior/inferoseptal basal to apical defect with mild reversibility.   Left ventricular function is abnormal. Nuclear stress EF: 48 %. The left ventricular ejection fraction is mildly decreased (45-54%). End diastolic cavity size is normal.   Low to intermediate  risk study based on combination of mild peri-infarct ischemia and mild LV dysfunction.   Physical Exam:   VS:  BP (!) 146/84   Pulse 74   Wt 214 lb (97.1 kg)   SpO2 99%   BMI 29.02 kg/m    Wt Readings from Last 3 Encounters:  11/26/22 214 lb (97.1 kg)  11/25/22 212 lb 9.6 oz (96.4 kg)  10/16/22 215 lb 12.8 oz (97.9 kg)     GEN: Pleasant, elderly male appearing in no acute distress NECK: No JVD; No carotid bruits CARDIAC: RRR, no murmurs, rubs, gallops RESPIRATORY:  Clear to auscultation without rales, wheezing or rhonchi  ABDOMEN: Appears non-distended. No obvious abdominal masses. EXTREMITIES: No clubbing or cyanosis. Trace lower extremity edema.  Distal pedal pulses are 2+ bilaterally.   Assessment and Plan:   1. CAD - He is s/p DES to proximal LAD and balloon angioplasty to distal LAD in 2016 and he did have a patent stent by repeat cath in 01/2017 with mild residual CAD. Recent NST in 05/2022 showed mild peri-infarct ischemia and medical management was pursued with a high threshold for cardiac catheterization given his age and CKD.  - Thankfully, he is overall doing well and denies any recurrent chest pain. - Continue current medical therapy with Atorvastatin 80 mg daily, Coreg 3.125 mg twice daily and Imdur 15 mg daily.  He is not on ASA given the need for anticoagulation.  2. HFpEF - Prior echocardiogram in 2022 showed his EF was normal at 55 to 60%. He does continue to take Torsemide 20 mg every other day and reports his edema has overall been well-controlled with this. His creatinine was stable at 1.52 by recent labs on 10/12/2022.  3. Paroxysmal Atrial Fibrillation - He reports an occasional "vibration sensation" along his chest but denies any specific palpitations. He is in normal sinus rhythm by examination today. Continue Coreg 3.125 mg twice daily for rate control. - Continue Eliquis 2.5 mg twice daily for anticoagulation which is the appropriate dose at this time  given his age of 68 and creatinine at 1.52 by recent labs on 10/12/2022.  4. HTN - His blood pressure is at 146/84 during today's visit and he reports it has been well-controlled when checked at home. Given his episodic dizziness, would not further titrate medical therapy at this time. Continue current therapy with Coreg 3.125 mg twice daily, Imdur 15 mg daily and Torsemide 20 mg every other day.  5. HLD - FLP in 10/2022 showed total cholesterol 129, triglycerides 91, HDL 48 and LDL 64. Continue current medical therapy with Atorvastatin 80 mg daily.  6. Stage 3 CKD - Baseline creatinine 1.5 - 1.6. At 1.52 by recent labs on 10/12/2022.   Signed, Ellsworth Lennox, PA-C

## 2022-11-26 ENCOUNTER — Ambulatory Visit: Payer: Medicare Other | Attending: Student | Admitting: Student

## 2022-11-26 ENCOUNTER — Encounter: Payer: Self-pay | Admitting: Student

## 2022-11-26 VITALS — BP 146/84 | HR 74 | Wt 214.0 lb

## 2022-11-26 DIAGNOSIS — N1832 Chronic kidney disease, stage 3b: Secondary | ICD-10-CM | POA: Diagnosis not present

## 2022-11-26 DIAGNOSIS — I5032 Chronic diastolic (congestive) heart failure: Secondary | ICD-10-CM | POA: Diagnosis not present

## 2022-11-26 DIAGNOSIS — I10 Essential (primary) hypertension: Secondary | ICD-10-CM

## 2022-11-26 DIAGNOSIS — E785 Hyperlipidemia, unspecified: Secondary | ICD-10-CM

## 2022-11-26 DIAGNOSIS — I2510 Atherosclerotic heart disease of native coronary artery without angina pectoris: Secondary | ICD-10-CM | POA: Insufficient documentation

## 2022-11-26 DIAGNOSIS — I480 Paroxysmal atrial fibrillation: Secondary | ICD-10-CM | POA: Diagnosis not present

## 2022-11-26 NOTE — Patient Instructions (Signed)
Medication Instructions:  Your physician recommends that you continue on your current medications as directed. Please refer to the Current Medication list given to you today.   Labwork: None today  Testing/Procedures: None today  Follow-Up: 6 months  Any Other Special Instructions Will Be Listed Below (If Applicable).  If you need a refill on your cardiac medications before your next appointment, please call your pharmacy.  

## 2022-11-30 ENCOUNTER — Encounter (HOSPITAL_COMMUNITY): Payer: Self-pay | Admitting: Hematology

## 2022-12-09 ENCOUNTER — Other Ambulatory Visit: Payer: Medicare Other | Admitting: Pharmacist

## 2022-12-09 ENCOUNTER — Telehealth: Payer: Self-pay | Admitting: Cardiology

## 2022-12-09 MED ORDER — ISOSORBIDE MONONITRATE ER 30 MG PO TB24: 30.0000 mg | ORAL_TABLET | Freq: Every day | ORAL | 3 refills | Status: DC

## 2022-12-09 NOTE — Telephone Encounter (Signed)
Pt c/o of Chest Pain: STAT if CP now or developed within 24 hours  1. Are you having CP right now? yes  2. Are you experiencing any other symptoms (ex. SOB, nausea, vomiting, sweating)? No   3. How long have you been experiencing CP? 2 days  4. Is your CP continuous or coming and going? Coming and going  5. Have you taken Nitroglycerin? No, but has been doubling up on isosorbide. He wants to know if its okay for him to keep doubling up. Please advise.  ?

## 2022-12-09 NOTE — Telephone Encounter (Signed)
Pt notified and verbalized understanding. Imdur 30 mg sent to [facility].

## 2022-12-09 NOTE — Telephone Encounter (Signed)
Pt states that he started having chest pain on yesterday. He reports that he took an extra 1/2 tablet of Imdur and pain was relieved. Denies N/V, and SOB. States that it's in the same place it was 6 months ago. Pt states that he took normal 1/2 tablet this morning and now he is having chest pain. Pt rates pain at 1.5/10. States that nothing makes the pain worse. Please advise.

## 2022-12-09 NOTE — Telephone Encounter (Signed)
Incrase his daily imdur from 15mg  daily to 30mg  daily. If ongoing symptoms can increase to 45mg  daily. Update [place] on symptoms end of the week. If severe progression needs ER evaluation   Dominga Ferry MD

## 2022-12-11 ENCOUNTER — Encounter: Payer: Self-pay | Admitting: Family Medicine

## 2022-12-11 ENCOUNTER — Telehealth: Payer: Self-pay | Admitting: Cardiology

## 2022-12-11 ENCOUNTER — Telehealth: Payer: Self-pay | Admitting: Family Medicine

## 2022-12-11 DIAGNOSIS — N1832 Chronic kidney disease, stage 3b: Secondary | ICD-10-CM

## 2022-12-11 NOTE — Telephone Encounter (Signed)
FYI

## 2022-12-11 NOTE — Telephone Encounter (Signed)
Pt c/o medication issue:  1. Name of Medication:  Isosorbide  2. How are you currently taking this medication (dosage and times per day)?    3. Are you having a reaction (difficulty breathing--STAT)?   4. What is your medication issue? He said to tell Dr Wyline Mood that afterr doubling the dose, like he said to do, he feels so much better

## 2022-12-11 NOTE — Telephone Encounter (Signed)
Nurses Not urgent Metabolic 7, A1c to be ordered under orders only for renal insufficiency or CKD as well as diabetes patient already is aware to do these labs before his office visit in July Thank you

## 2022-12-15 NOTE — Telephone Encounter (Signed)
Blood work ordered in EPIC 

## 2022-12-16 DIAGNOSIS — N1832 Chronic kidney disease, stage 3b: Secondary | ICD-10-CM | POA: Diagnosis not present

## 2022-12-22 ENCOUNTER — Ambulatory Visit (INDEPENDENT_AMBULATORY_CARE_PROVIDER_SITE_OTHER): Payer: Medicare Other | Admitting: Podiatry

## 2022-12-22 DIAGNOSIS — L84 Corns and callosities: Secondary | ICD-10-CM | POA: Diagnosis not present

## 2022-12-22 DIAGNOSIS — B351 Tinea unguium: Secondary | ICD-10-CM

## 2022-12-22 DIAGNOSIS — E1151 Type 2 diabetes mellitus with diabetic peripheral angiopathy without gangrene: Secondary | ICD-10-CM | POA: Diagnosis not present

## 2022-12-22 NOTE — Progress Notes (Signed)
Subjective:  Patient ID: Jerry Burns, male    DOB: 10/27/1933,  MRN: [id number]  Jerry Burns presents to clinic today for:  Chief Complaint  Patient presents with   Callouses    Patient reports thick skin lesions on the bottom of each foot causing discomfort. Dermatologist Rxed betamethasone for the feet.   . Patient notes nails are thick and elongated, causing pain in shoe gear when ambulating.  Also presents for diabetic foot check.  Notes that there are painful calluses underneath the fifth toes bilateral.  He uses Vaseline on them daily but is wondering if there is anything better that he could try on the areas.  He is interested in a diabetic shoe program.  His last pair were from approximately 20 years ago.  He states that he has been trying to trim the nails himself at home.  PCP is Babs Sciara, MD.  Allergies  Allergen Reactions   Adhesive [Tape]     bandaids cause blisters after leaving on for 24 hours    Sulfa Antibiotics Other (See Comments)    "was terrible" "eye went crazy"   Latex Other (See Comments)    blisters    Review of Systems: Negative except as noted in the HPI.  Objective:  There were no vitals filed for this visit.  Jerry Burns is a pleasant 89 y.o. male in NAD. AAO x 3.  Vascular Examination: Patient has palpable DP pulse, absent PT pulse bilateral.  Delayed capillary refill bilateral toes.  Sparse digital hair bilateral.  Proximal to distal cooling WNL bilateral.    Dermatological Examination: Interspaces are clear with no open lesions noted bilateral.  Nails are 3-23mm thick, with yellowish/brown discoloration, subungual debris and distal onycholysis x10.  There is pain with compression of nails x10.  There are hyperkeratotic lesions noted submet 5 bilateral.  No soft tissue breakdown is seen.  There is a linear type of callus submet 2 right foot.  No soft tissue breakdown is seen.  No surrounding erythema..  Neurological  Examination: Protective sensation intact b/l LE. Vibratory sensation intact b/l LE.  Musculoskeletal Examination: Muscle strength 5/5 to all LE muscle groups b/l.  There are mild semiflexible contractures of the toes, consistent with hammertoe deformities.     Latest Ref Rng & Units 10/12/2022    9:37 AM 07/01/2022   11:17 AM 03/02/2022    9:25 AM  Hemoglobin A1C  Hemoglobin-A1c 4.8 - 5.6 % 7.7  7.3  7.6      Patient qualifies for at-risk foot care because of diabetes with mild PVD.  Current use of anticoagulant therapy..  Assessment/Plan: 1. Type II diabetes mellitus with peripheral circulatory disorder (HCC)   2. Dermatophytosis of nail   3. Corns     Mycotic nails x10 were sharply debrided with sterile nail nippers to decrease bulk and length.  He could use an over-the-counter antifungal solution such as Fungi-Nail on the affected toenails.  Hyperkeratotic lesions were shaved with #312 blade.  Recommended AmLactin cream for the calluses daily.  Discussed the diabetic shoe program with the patient.  We can customize the diabetic insoles to offload the fifth metatarsal heads to make him more comfortable and walking.  He is very interested in proceeding with this.  Return in about 3 months (around 03/24/2023) for [facility].   Clerance Lav, DPM, FACFAS [facility]     [address]  [place], [place] [address]                Office [phone]  Fax [phone]

## 2022-12-23 DIAGNOSIS — E1122 Type 2 diabetes mellitus with diabetic chronic kidney disease: Secondary | ICD-10-CM | POA: Diagnosis not present

## 2022-12-23 DIAGNOSIS — Z8673 Personal history of transient ischemic attack (TIA), and cerebral infarction without residual deficits: Secondary | ICD-10-CM | POA: Diagnosis not present

## 2022-12-23 DIAGNOSIS — E875 Hyperkalemia: Secondary | ICD-10-CM | POA: Diagnosis not present

## 2022-12-23 DIAGNOSIS — I129 Hypertensive chronic kidney disease with stage 1 through stage 4 chronic kidney disease, or unspecified chronic kidney disease: Secondary | ICD-10-CM | POA: Diagnosis not present

## 2022-12-23 DIAGNOSIS — N1832 Chronic kidney disease, stage 3b: Secondary | ICD-10-CM | POA: Diagnosis not present

## 2023-01-04 ENCOUNTER — Other Ambulatory Visit: Payer: Self-pay | Admitting: Family Medicine

## 2023-01-10 ENCOUNTER — Telehealth: Payer: Self-pay | Admitting: Family Medicine

## 2023-01-10 NOTE — Telephone Encounter (Signed)
Nurses I reviewed over his readings that he sent Overall they look good Continue current measures Recommend keeping follow-up visit in July Do lab work a few days before that visit Thank you

## 2023-01-11 NOTE — Telephone Encounter (Signed)
Message sent to patient

## 2023-01-18 ENCOUNTER — Ambulatory Visit (INDEPENDENT_AMBULATORY_CARE_PROVIDER_SITE_OTHER): Payer: Medicare Other | Admitting: Podiatry

## 2023-01-18 DIAGNOSIS — L84 Corns and callosities: Secondary | ICD-10-CM

## 2023-01-18 DIAGNOSIS — E1151 Type 2 diabetes mellitus with diabetic peripheral angiopathy without gangrene: Secondary | ICD-10-CM

## 2023-01-18 DIAGNOSIS — B351 Tinea unguium: Secondary | ICD-10-CM

## 2023-01-18 NOTE — Progress Notes (Signed)
Patient presents today to be measured for  diabetic shoes and insoles.  Patient was measured for  1 pair of diabetic shoes and 3 pairs of foam casted diabetic insoles.  Wt 202.8 Ht 5 11 Shoe size 12w Shoe selection [id number] 598  Treating physician  is Dr Lilyan Punt  Financial paperwork signed.   Re-appointment for regularly scheduled diabetic foot care visits or if they should experience any trouble with the shoes or insoles.

## 2023-02-01 ENCOUNTER — Other Ambulatory Visit: Payer: Self-pay | Admitting: Family Medicine

## 2023-02-03 DIAGNOSIS — N1832 Chronic kidney disease, stage 3b: Secondary | ICD-10-CM | POA: Diagnosis not present

## 2023-02-03 DIAGNOSIS — E1122 Type 2 diabetes mellitus with diabetic chronic kidney disease: Secondary | ICD-10-CM | POA: Diagnosis not present

## 2023-02-03 DIAGNOSIS — Z794 Long term (current) use of insulin: Secondary | ICD-10-CM | POA: Diagnosis not present

## 2023-02-04 LAB — BASIC METABOLIC PANEL
BUN/Creatinine Ratio: 27 — ABNORMAL HIGH (ref 10–24)
BUN: 42 mg/dL — ABNORMAL HIGH (ref 8–27)
CO2: 23 mmol/L (ref 20–29)
Calcium: 9.9 mg/dL (ref 8.6–10.2)
Chloride: 107 mmol/L — ABNORMAL HIGH (ref 96–106)
Creatinine, Ser: 1.55 mg/dL — ABNORMAL HIGH (ref 0.76–1.27)
Glucose: 110 mg/dL — ABNORMAL HIGH (ref 70–99)
Potassium: 4.4 mmol/L (ref 3.5–5.2)
Sodium: 145 mmol/L — ABNORMAL HIGH (ref 134–144)
eGFR: 43 mL/min/{1.73_m2} — ABNORMAL LOW (ref >59)

## 2023-02-04 LAB — HEMOGLOBIN A1C
Est. average glucose Bld gHb Est-mCnc: 169 mg/dL
Hgb A1c MFr Bld: 7.5 % — ABNORMAL HIGH (ref 4.8–5.6)

## 2023-02-08 ENCOUNTER — Other Ambulatory Visit: Payer: Self-pay | Admitting: Family Medicine

## 2023-02-09 ENCOUNTER — Other Ambulatory Visit: Payer: Self-pay | Admitting: Family Medicine

## 2023-02-10 ENCOUNTER — Ambulatory Visit (INDEPENDENT_AMBULATORY_CARE_PROVIDER_SITE_OTHER): Payer: Medicare Other | Admitting: Family Medicine

## 2023-02-10 VITALS — BP 114/66 | HR 73 | Wt 214.4 lb

## 2023-02-10 DIAGNOSIS — E1122 Type 2 diabetes mellitus with diabetic chronic kidney disease: Secondary | ICD-10-CM | POA: Diagnosis not present

## 2023-02-10 DIAGNOSIS — Z794 Long term (current) use of insulin: Secondary | ICD-10-CM

## 2023-02-10 DIAGNOSIS — R42 Dizziness and giddiness: Secondary | ICD-10-CM | POA: Diagnosis not present

## 2023-02-10 DIAGNOSIS — E785 Hyperlipidemia, unspecified: Secondary | ICD-10-CM

## 2023-02-10 DIAGNOSIS — L84 Corns and callosities: Secondary | ICD-10-CM | POA: Diagnosis not present

## 2023-02-10 DIAGNOSIS — Z79899 Other long term (current) drug therapy: Secondary | ICD-10-CM

## 2023-02-10 DIAGNOSIS — N1832 Chronic kidney disease, stage 3b: Secondary | ICD-10-CM

## 2023-02-10 DIAGNOSIS — R070 Pain in throat: Secondary | ICD-10-CM

## 2023-02-10 DIAGNOSIS — I10 Essential (primary) hypertension: Secondary | ICD-10-CM | POA: Diagnosis not present

## 2023-02-10 NOTE — Progress Notes (Signed)
   Subjective:    Patient ID: Jerry Burns, male    DOB: 10-11-1933, 89 y.o.   MRN: [id number]  HPI Patient arrives today for 2 month follow up.   Patient brought list to go over (see list).  Type 2 diabetes mellitus with stage 3b chronic kidney disease, with long-term current use of insulin (HCC) - Plan: Hemoglobin A1c  Hyperlipidemia, unspecified hyperlipidemia type - Plan: Lipid panel  Essential hypertension - Plan: Basic Metabolic Panel  High risk medication use - Plan: Hepatic function panel  Dizziness  Throat pain  Callus of foot  Patient saw podiatry they recommended special shoes for him he does have a callus along with 3 ulcerative callus as well as some mild neuropathy in the feet  He is diabetic he brings his sugars in for review he is not having any low spells occasionally are higher than what we like to see but most of them look pretty good  Blood pressures have been doing all right and are doing well today is taking his medicine regular basis  He states he at times he does find himself getting dizzy when he makes sudden movements such as standing up or turning around quickly but denies any falls  Occasionally has slight reflux issues in the back of his throat saw ENT previously currently right now does not feel he needs to see the  Review of Systems     Objective:   Physical Exam General-in no acute distress Eyes-no discharge Lungs-respiratory rate normal, CTA CV-no murmurs,RRR Extremities skin warm dry no edema Neuro grossly normal Behavior normal, alert  Diabetic foot exam preulcerative callus noted.  Pulses okay.  Minimal neuropathy      Assessment & Plan:  1. Type 2 diabetes mellitus with stage 3b chronic kidney disease, with long-term current use of insulin (HCC) Check A1c continue medication avoid low sugars do labs before next visit previous labs reviewed - Hemoglobin A1c  2. Hyperlipidemia, unspecified hyperlipidemia type Cholesterol  overall look good previously continue medicine check labs for next visit - Lipid panel  3. Essential hypertension Blood pressure decent control does not drop with standing continue current measures - Basic Metabolic Panel  4. High risk medication use Check labs - Hepatic function panel  5. Dizziness Blood pressure good sitting standing we did discuss how medications he is on can sometimes make him feel off balance but recommend for him to follow-up with any progressive troubles or problems  6. Throat pain He is not having any difficulty swallowing has occasional reflux issue has seen ENT he can do a follow-up visit if he has ongoing troubles  7. Callus of foot He would qualify for diabetic shoes forms filled out

## 2023-02-15 ENCOUNTER — Other Ambulatory Visit: Payer: Medicare Other

## 2023-02-16 ENCOUNTER — Other Ambulatory Visit: Payer: Self-pay | Admitting: Family Medicine

## 2023-02-25 ENCOUNTER — Ambulatory Visit (INDEPENDENT_AMBULATORY_CARE_PROVIDER_SITE_OTHER): Payer: Medicare Other

## 2023-02-25 DIAGNOSIS — B351 Tinea unguium: Secondary | ICD-10-CM

## 2023-02-25 DIAGNOSIS — L84 Corns and callosities: Secondary | ICD-10-CM

## 2023-02-25 DIAGNOSIS — E1151 Type 2 diabetes mellitus with diabetic peripheral angiopathy without gangrene: Secondary | ICD-10-CM | POA: Diagnosis not present

## 2023-02-25 NOTE — Progress Notes (Signed)

## 2023-03-04 ENCOUNTER — Other Ambulatory Visit: Payer: Self-pay | Admitting: Family Medicine

## 2023-04-06 ENCOUNTER — Ambulatory Visit (INDEPENDENT_AMBULATORY_CARE_PROVIDER_SITE_OTHER): Payer: Medicare Other | Admitting: Podiatry

## 2023-04-06 DIAGNOSIS — E1151 Type 2 diabetes mellitus with diabetic peripheral angiopathy without gangrene: Secondary | ICD-10-CM | POA: Diagnosis not present

## 2023-04-06 DIAGNOSIS — B351 Tinea unguium: Secondary | ICD-10-CM

## 2023-04-06 DIAGNOSIS — L84 Corns and callosities: Secondary | ICD-10-CM | POA: Diagnosis not present

## 2023-04-06 NOTE — Progress Notes (Unsigned)
Subjective:  Patient ID: Jerry Burns, male    DOB: [birth year],  MRN: [id number]  Jerry Burns presents to clinic today for:  Chief Complaint  Patient presents with   Nail Problem    dfc   Callouses    Right ball of foot under great toe needs attention  . Patient notes nails are thick and elongated, causing pain in shoe gear when ambulating.  He has calluses on both feet that are painful.  PCP is Babs Sciara, MD.  Last seen 02/10/23  Past Medical History:  Diagnosis Date   Anginal pain (HCC)    Aortic atherosclerosis (HCC) 10/06/2020   Seen on CAT scan 2021   Arthritis    Bilateral knee   Atrial fibrillation (HCC) 12/28/2021   On Eliquis   CHF (congestive heart failure) (HCC)    Coronary artery disease    Diabetes mellitus    insulin dependent    DVT, lower extremity, distal, acute (HCC) 09/09/2018   Hyperlipidemia    Hypertension    Progressive angina (HCC) 03/2015   a. s/p DES to proximal LAD and balloon angioplasty to distal LAD in 2016 b. patent stent by repeat cath in 01/2017 with mild residual CAD)   Pulmonary nodule 10/06/2020   Seen on CAT scan 2020 1 repeat CAT scan in 2022   Stroke [facility])     Allergies  Allergen Reactions   Adhesive [Tape]     bandaids cause blisters after leaving on for 24 hours    Sulfa Antibiotics Other (See Comments)    "was terrible" "eye went crazy"   Latex Other (See Comments)    blisters   Objective:  There were no vitals filed for this visit.  ZAM ENT is a pleasant 89 y.o. male in NAD. AAO x 3.  Vascular Examination: Patient has palpable DP pulse, absent PT pulse bilateral.  Delayed capillary refill bilateral toes.  Sparse digital hair bilateral.  Proximal to distal cooling WNL bilateral.    Dermatological Examination: Interspaces are clear with no open lesions noted bilateral.  Skin is shiny and atrophic bilateral.  Nails are 3-90mm thick, with yellowish/brown discoloration, subungual debris and distal  onycholysis x10.  There is pain with compression of nails x10.  There are hyperkeratotic lesions noted bilateral submet 1 & 5. .     Latest Ref Rng & Units 02/03/2023    3:01 PM 10/12/2022    9:37 AM 07/01/2022   11:17 AM  Hemoglobin A1C  Hemoglobin-A1c 4.8 - 5.6 % 7.5  7.7  7.3    Patient qualifies for at-risk foot care because of diabetes with PVD .  Assessment/Plan: 1. Dermatophytosis of nail   2. Corns   3. Type II diabetes mellitus with peripheral circulatory disorder (HCC)    Mycotic nails x10 were sharply debrided with sterile nail nippers and power debriding burr to decrease bulk and length.  Hyperkeratotic lesions were shaved with #312 blade.   Return in about 3 months (around 07/06/2023) for [facility].   Clerance Lav, DPM, FACFAS [facility]     [address] [place] [address]                Office [phone]  Fax [phone]  [phone] 

## 2023-04-09 ENCOUNTER — Encounter: Payer: Self-pay | Admitting: Cardiology

## 2023-04-09 ENCOUNTER — Ambulatory Visit: Payer: Medicare Other | Attending: Cardiology | Admitting: Cardiology

## 2023-04-09 VITALS — BP 124/72 | HR 68 | Ht 72.0 in | Wt 211.0 lb

## 2023-04-09 DIAGNOSIS — I25118 Atherosclerotic heart disease of native coronary artery with other forms of angina pectoris: Secondary | ICD-10-CM

## 2023-04-09 DIAGNOSIS — E785 Hyperlipidemia, unspecified: Secondary | ICD-10-CM

## 2023-04-09 DIAGNOSIS — I4891 Unspecified atrial fibrillation: Secondary | ICD-10-CM

## 2023-04-09 DIAGNOSIS — D6869 Other thrombophilia: Secondary | ICD-10-CM

## 2023-04-09 NOTE — Progress Notes (Addendum)
Clinical Summary Jerry Burns is a 89 y.o.male seen today for follow up of the following medical problems.    This is a focused visit on history of CAD with recent chest pain, discuss recent stress test.  1. CAD  - prior DES to prox LAD in 2016, repeat cath 2018 with patent stent     - dull pain left sided, can occur at rest or with activity. 3/10 in severity, some SOB/DOE that is progressing. Lasts about 10 minutes, occurs 3 times a day. No relation to food or eating.     05/2022 nuclear stress: inferior infarct with mild peri-ifnarct. LVEF 48%  -started imdur 15mg  daily     - no chest pains, no SOB/DOE  - compliant with meds     2. Chronic diastolic HF - 12/2019 echo LVEF 55-60%, normal RV function, indet DDx. Small to mod pericardial effusion     - some swelling at times, improving - takes torsemide about 4 times a week. Can have some orthostatic dizzines at times - no SOB/DOE.    3. Pericardial effusion - resolved   4. CKD 3 - followed by nephrology   5. Afib -noted on recent monitor -no recent palpitaitons.   - compliant with eliquis, no signs of bleeding.   -rare short palpitations - no bleeding on eliquis.    6. Hyperlipidemia - 02/2022 TC 132 TG 104 HDL 48 LDL 65 - he is on atorvastatin 80mg  daily.   10/2022 TC 129 TG 91 HDL 48 LDL 64 Past Medical History:  Diagnosis Date   Anginal pain (HCC)    Aortic atherosclerosis (HCC) 10/06/2020   Seen on CAT scan 2021   Arthritis    Bilateral knee   Atrial fibrillation (HCC) 12/28/2021   On Eliquis   CHF (congestive heart failure) (HCC)    Coronary artery disease    Diabetes mellitus    insulin dependent    DVT, lower extremity, distal, acute (HCC) 09/09/2018   Hyperlipidemia    Hypertension    Progressive angina (HCC) 03/2015   a. s/p DES to proximal LAD and balloon angioplasty to distal LAD in 2016 b. patent stent by repeat cath in 01/2017 with mild residual CAD)   Pulmonary nodule 10/06/2020   Seen on  CAT scan 2020 1 repeat CAT scan in 2022   Stroke [facility])      Allergies  Allergen Reactions   Adhesive [Tape]     bandaids cause blisters after leaving on for 24 hours    Sulfa Antibiotics Other (See Comments)    "was terrible" "eye went crazy"   Latex Other (See Comments)    blisters     Current Outpatient Medications  Medication Sig Dispense Refill   apixaban (ELIQUIS) 2.5 MG TABS tablet TAKE (1) TABLET BY MOUTH TWICE DAILY. 60 tablet 5   Ascorbic Acid (VITAMIN C) 1000 MG tablet Take 1,000 mg by mouth daily.      atorvastatin (LIPITOR) 80 MG tablet TAKE (1) TABLET BY MOUTH ONCE DAILY. 90 tablet 0   augmented betamethasone dipropionate (DIPROLENE-AF) 0.05 % ointment Apply topically 2 (two) times daily as needed.     B-D UF III MINI PEN NEEDLES 31G X 5 MM MISC SMARTSIG:1 Each SUB-Q Twice Daily     BD PEN NEEDLE NANO 2ND GEN 32G X 4 MM MISC USE 1 PEN NEEDLE FIVE TIMES DAILY     carvedilol (COREG) 3.125 MG tablet Take 1 tablet (3.125 mg total) by mouth 2 (  two) times daily with a meal. 60 tablet 3   COMBIGAN 0.2-0.5 % ophthalmic solution Place 1 drop into the left eye 2 (two) times daily.   2   Cyanocobalamin (B-12) 2500 MCG TABS Take 1 tablet by mouth 2 (two) times a week. Three times a week on Monday Wednesday and Friday     erythromycin ophthalmic ointment Place 1 application  into the left eye 3 (three) times daily as needed (infection).     Flaxseed, Linseed, (FLAXSEED OIL PO) Take 1 capsule by mouth daily.      glucose blood (CONTOUR NEXT TEST) test strip USE 1 STRIP TO CHECK GLUCOSE THREE TIMES DAILY 300 each 0   insulin lispro (HUMALOG KWIKPEN) 100 UNIT/ML KwikPen INJECT TWICE DAILY PER SLIDING SCALE FOLLOWING BREAKFAST AND DINNER: <85=0UNITS, 85-110=3 UNITS, 111-160 = 5, 161-220 = 7, >221 = 9 UNITS 15 mL 0   isosorbide mononitrate (IMDUR) 30 MG 24 hr tablet Take 1 tablet (30 mg total) by mouth daily. 90 tablet 3   JARDIANCE 25 MG TABS tablet Take 25 mg by mouth daily.      ketoconazole (NIZORAL) 2 % cream Apply 1 Application topically 2 (two) times daily. 120 g 4   Lactobacillus (PROBIOTIC ACIDOPHILUS) CAPS Take 1 capsule by mouth daily at 6 (six) AM.     LANTUS SOLOSTAR 100 UNIT/ML Solostar Pen INJECT 24 TO 25 UNITS SUBCUTANEOUSLY ONCE DAILY AT 10AM. 15 mL 0   Multiple Vitamin (MULTIVITAMIN) tablet Take 1 tablet by mouth daily.     nitroGLYCERIN (NITROSTAT) 0.4 MG SL tablet PLACE 1 TAB UNDER TONGUE EVERY 5 MIN IF NEEDED FOR CHEST PAIN. MAY USE 3 TIMES.NO RELIEF CALL 911. 25 tablet 3   polyethylene glycol powder (MIRALAX) 17 GM/SCOOP powder Start by taking 1/2 capful mixed in 4 ounces of fluid daily. You can increase to 1 capful daily if needed to maintain easy BMs. 527 g 5   Polyvinyl Alcohol-Povidone (REFRESH OP) Place 1 drop into both ears 2 (two) times daily as needed (dryness).     tamsulosin (FLOMAX) 0.4 MG CAPS capsule Take 2 capsules po at bedtime. if this causes drowsiness or dizziness to reduce it to just 1/day 180 capsule 1   torsemide (DEMADEX) 20 MG tablet TAKE 1 TO 2 TABLETS BY MOUTH EACH MORNING AS DIRECTED FOR SWELLING IN THE LEGS. 60 tablet 0   Turmeric 500 MG CAPS Take 500 mg by mouth daily.     VELTASSA 8.4 g packet Take 1 packet by mouth daily.     No current facility-administered medications for this visit.     Past Surgical History:  Procedure Laterality Date   APPENDECTOMY     CARDIAC CATHETERIZATION N/A 04/12/2015   Procedure: Left Heart Cath and Coronary Angiography;  Surgeon: Tonny Bollman, MD; pLAD 75>0% w/ 3.0x12 mm Resolute DES, dLAD 80>0% w/ Angiosculpt scoring balloon, CFX luminal irreg, RCA mild dz, EF 55-65%    COLONOSCOPY  2011   RMR: 1. Normal rectum 2. Left sided diverticula , 2 ascending colon polyps, status post snare polypectomy. Remainder of colonic mucosa appeared unremarkable.    COLONOSCOPY N/A 05/13/2016   Procedure: COLONOSCOPY;  Surgeon: Corbin Ade, MD;  Location: [facility];  Service: Endoscopy;  Laterality:  N/A;  7:30 AM   COLONOSCOPY N/A 05/18/2018   Rourk: Diverticulosis, single tubular adenoma removed.  No plans for future screening or surveillance colonoscopy due to age.   CORONARY ANGIOGRAPHY N/A 02/04/2017   Procedure: CORONARY ANGIOGRAPHY (CATH LAB);  Surgeon: Lennette Bihari, MD;  Location: [facility] INVASIVE CV LAB;  Service: Cardiovascular;  Laterality: N/A;   CORONARY ANGIOPLASTY WITH STENT PLACEMENT  08/22/2014   PTCA/DES X mLAD with 2.5 x 16 mm Promus Premier DES by Dr Sanjuana Kava   ESOPHAGOGASTRODUODENOSCOPY N/A 05/18/2018   Rourk: Ulcerative reflux esophagitis, hiatal hernia, erosive gastropathy with H. pylori on biopsy, also in the setting of NSAID use.   FOOT SURGERY Right    LEFT HEART CATHETERIZATION WITH CORONARY ANGIOGRAM N/A 08/22/2014   Procedure: LEFT HEART CATHETERIZATION WITH CORONARY ANGIOGRAM;  Surgeon: Kathleene Hazel, MD;  Location: [facility] CATH LAB;  Service: Cardiovascular;  Laterality: N/A;   PERCUTANEOUS CORONARY STENT INTERVENTION (PCI-S)  08/22/2014   Procedure: PERCUTANEOUS CORONARY STENT INTERVENTION (PCI-S);  Surgeon: Kathleene Hazel, MD;    SHOULDER SURGERY     TONSILLECTOMY       Allergies  Allergen Reactions   Adhesive [Tape]     bandaids cause blisters after leaving on for 24 hours    Sulfa Antibiotics Other (See Comments)    "was terrible" "eye went crazy"   Latex Other (See Comments)    blisters      Family History  Problem Relation Age of Onset   Diabetes Other    Diabetes Brother    Colon cancer Maternal Uncle    Skin cancer Mother    Diabetes Father    Hypertension Sister    Skin cancer Sister      Social History Mr. Vandemark reports that he quit smoking about 15 years ago. His smoking use included cigarettes and cigars. He started smoking about 67 years ago. He has a 52 pack-year smoking history. He has never been exposed to tobacco smoke. He has never used smokeless tobacco. Mr. Vitulli reports current alcohol use.   Review of  Systems CONSTITUTIONAL: No weight loss, fever, chills, weakness or fatigue.  HEENT: Eyes: No visual loss, blurred vision, double vision or yellow sclerae.No hearing loss, sneezing, congestion, runny nose or sore throat.  SKIN: No rash or itching.  CARDIOVASCULAR: per hpi RESPIRATORY: No shortness of breath, cough or sputum.  GASTROINTESTINAL: No anorexia, nausea, vomiting or diarrhea. No abdominal pain or blood.  GENITOURINARY: No burning on urination, no polyuria NEUROLOGICAL: No headache, dizziness, syncope, paralysis, ataxia, numbness or tingling in the extremities. No change in bowel or bladder control.  MUSCULOSKELETAL: No muscle, back pain, joint pain or stiffness.  LYMPHATICS: No enlarged nodes. No history of splenectomy.  PSYCHIATRIC: No history of depression or anxiety.  ENDOCRINOLOGIC: No reports of sweating, cold or heat intolerance. No polyuria or polydipsia.  Marland Kitchen   Physical Examination There were no vitals filed for this visit. Filed Weights   04/09/23 0746  Weight: 211 lb (95.7 kg)   Today's Vitals   04/09/23 0746  BP: 124/72  Pulse: 68  SpO2: 97%  Weight: 211 lb (95.7 kg)  Height: 6' (1.829 m)   Body mass index is 28.62 kg/m.  Gen: resting comfortably, no acute distress HEENT: no scleral icterus, pupils equal round and reactive, no palptable cervical adenopathy,  CV: RRR, no m/rg, no jvd Resp: Clear to auscultation bilaterally GI: abdomen is soft, non-tender, non-distended, normal bowel sounds, no hepatosplenomegaly MSK: extremities are warm, no edema.  Skin: warm, no rash Neuro:  no focal deficits Psych: appropriate affect   Diagnostic Studies Echocardiogram 12/15/19:   1. Left ventricular ejection fraction, by estimation, is 55%. The left  ventricle has normal function. The left ventricle demonstrates regional  wall motion  abnormalities (see scoring diagram/findings for description).  Left ventricular diastolic  parameters are indeterminate.   2.  Right ventricular systolic function is normal. The right ventricular  size is normal. There is mildly elevated pulmonary artery systolic  pressure. The estimated right ventricular systolic pressure is 36.2 mmHg.   3. Left atrial size was moderately dilated.   4. Moderate pericardial effusion. The pericardial effusion is  circumferential with anterior predominance. There is evidence of  organization and potential chronicity. No clear right ventricular  compromise or diagnostic variation in mitral outflow to  suggest tamponade physiology.   5. The mitral valve is grossly normal. Mild mitral valve regurgitation.   6. The aortic valve is tricuspid. Aortic valve regurgitation is not  visualized.   7. The inferior vena cava is dilated in size with <50% respiratory  variability, suggesting right atrial pressure of 15 mmHg.    NST: 12/2016 There was no ST segment deviation noted during stress. Findings consistent with prior inferior/inferoseptal/inferoapical myocardial infarction with moderate peri-infarct ischemia. This is an intermediate risk study. The left ventricular ejection fraction is normal (55-65%).   Cardiac Catheterization: 01/2017 Prox RCA lesion, 15 %stenosed. Dist RCA lesion, 20 %stenosed. 1st Diag lesion, 20 %stenosed. Ost LAD-1 lesion, 40 %stenosed. Ost LAD-2 lesion, 0 %stenosed. Dist LAD-1 lesion, 30 %stenosed. Dist LAD-2 lesion, 15 %stenosed.   Mild residual CAD with a patent proximal LAD stent and 30-40% narrowing immediately proximal to the stented segment, 20% proximal diagonal stenosis, 30% mid LAD stenosis, patent distal LAD stent with 15% intimal hyperplasia; normal left circumflex system; mild 20% proximal and distal stenoses in a dominant RCA.   RECOMMENDATION: Medical therapy.  Recommend echo Doppler study for LV function assessment since despite prolonged attempt at crossing the aortic valve with a catheter due to the significant tortuosity and left  ventriculography was not able to be performed during this procedure.   11/2021 monitor 14 day monitor Afib throughout the study with min HR 46, Max HR 119, Avg HR 73 Frequent ventricular ectopy as PVCs (8.9%). Rare couplets and triplets. Two runs of NSVT longest 4 beats No symptoms reported     Patch Wear Time:  13 days and 16 hours (2023-04-24T14:06:58-0400 to 2023-05-08T06:35:47-0400)   2 Ventricular Tachycardia runs occurred, the run with the fastest interval lasting 4 beats with a max rate of 193 bpm, the longest lasting 4 beats with an avg rate of 125 bpm. Atrial Flutter occurred continuously (100% burden), ranging from 46-119 bpm  (avg of 73 bpm). Isolated VEs were frequent (8.9%, 409811), VE Couplets were rare (<1.0%, 3002), and VE Triplets were rare (<1.0%, 275). Ventricular Bigeminy and Trigeminy were present.      05/2022 nuclear stress:    Findings are consistent with prior inferior myocardial infarction with mild peri-infarct ischemia.   No ST deviation was noted.   LV perfusion is abnormal. There is a moderate size moderate intensity inferior/inferoseptal basal to apical defect with mild reversibility.   Left ventricular function is abnormal. Nuclear stress EF: 48 %. The left ventricular ejection fraction is mildly decreased (45-54%). End diastolic cavity size is normal.   Low to intermediate risk study based on combination of mild peri-infarct ischemia and mild LV dysfunction.      Assessment and Plan    1. CAD with other forms of angina - some recent chest pain unclear etiology, mixed in characteristics - lexiscan suggesting inferior infarct mild peri-infarct ischemia - started on imdur with resolution of symptoms, continues to do well on  imdur  - continue current medications  2. Afib/acquired thrombophilia - infrequent symptoms, has some orthostatic symptoms at times would avoid further titration of coreg - continue current meds including eliquis for stroke  prevention - EKG today shows rate controlled afib  3. HLD - at goal, continue current meds   4.Chronic diastolic HF - no SOB/DOE, I suspect some of his LE edema is more related to venous insufficiency. With orthostatic symptoms at times and CKD would not titrate his diuretic, continue current regimen     Antoine Poche, M.D.

## 2023-04-09 NOTE — Patient Instructions (Signed)
Medication Instructions:  Your physician recommends that you continue on your current medications as directed. Please refer to the Current Medication list given to you today.  *If you need a refill on your cardiac medications before your next appointment, please call your pharmacy*   Lab Work: None If you have labs (blood work) drawn today and your tests are completely normal, you will receive your results only by: MyChart Message (if you have MyChart) OR A paper copy in the mail If you have any lab test that is abnormal or we need to change your treatment, we will call you to review the results.   Testing/Procedures: None   Follow-Up: At [facility], you and your health needs are our priority.  As part of our continuing mission to provide you with exceptional heart care, we have created designated Provider Care Teams.  These Care Teams include your primary Cardiologist (physician) and Advanced Practice Providers (APPs -  Physician Assistants and Nurse Practitioners) who all work together to provide you with the care you need, when you need it.  We recommend signing up for the patient portal called "MyChart".  Sign up information is provided on this After Visit Summary.  MyChart is used to connect with patients for Virtual Visits (Telemedicine).  Patients are able to view lab/test results, encounter notes, upcoming appointments, etc.  Non-urgent messages can be sent to your provider as well.   To learn more about what you can do with MyChart, go to [email or web address].    Your next appointment:   6 month(s)  Provider:   You may see Dina Rich, MD or one of the following Advanced Practice Providers on your designated Care Team:   Randall An, PA-C  Jacolyn Reedy, [place]     Other Instructions

## 2023-05-06 ENCOUNTER — Other Ambulatory Visit: Payer: Self-pay | Admitting: Family Medicine

## 2023-05-15 ENCOUNTER — Other Ambulatory Visit: Payer: Self-pay | Admitting: Family Medicine

## 2023-05-25 ENCOUNTER — Ambulatory Visit: Payer: Medicare Other | Admitting: Student

## 2023-06-08 DIAGNOSIS — I10 Essential (primary) hypertension: Secondary | ICD-10-CM | POA: Diagnosis not present

## 2023-06-08 DIAGNOSIS — R748 Abnormal levels of other serum enzymes: Secondary | ICD-10-CM | POA: Diagnosis not present

## 2023-06-08 DIAGNOSIS — E1122 Type 2 diabetes mellitus with diabetic chronic kidney disease: Secondary | ICD-10-CM | POA: Diagnosis not present

## 2023-06-08 DIAGNOSIS — Z79899 Other long term (current) drug therapy: Secondary | ICD-10-CM | POA: Diagnosis not present

## 2023-06-08 DIAGNOSIS — E785 Hyperlipidemia, unspecified: Secondary | ICD-10-CM | POA: Diagnosis not present

## 2023-06-08 DIAGNOSIS — N1832 Chronic kidney disease, stage 3b: Secondary | ICD-10-CM | POA: Diagnosis not present

## 2023-06-08 DIAGNOSIS — Z794 Long term (current) use of insulin: Secondary | ICD-10-CM | POA: Diagnosis not present

## 2023-06-09 LAB — BASIC METABOLIC PANEL
BUN/Creatinine Ratio: 33 — ABNORMAL HIGH (ref 10–24)
BUN: 50 mg/dL — ABNORMAL HIGH (ref 8–27)
CO2: 23 mmol/L (ref 20–29)
Calcium: 9.7 mg/dL (ref 8.6–10.2)
Chloride: 106 mmol/L (ref 96–106)
Creatinine, Ser: 1.5 mg/dL — ABNORMAL HIGH (ref 0.76–1.27)
Glucose: 120 mg/dL — ABNORMAL HIGH (ref 70–99)
Potassium: 4.4 mmol/L (ref 3.5–5.2)
Sodium: 142 mmol/L (ref 134–144)
eGFR: 45 mL/min/{1.73_m2} — ABNORMAL LOW (ref >59)

## 2023-06-09 LAB — LIPID PANEL
Chol/HDL Ratio: 2.6 {ratio} (ref 0.0–5.0)
Cholesterol, Total: 125 mg/dL (ref 100–199)
HDL: 48 mg/dL (ref >39)
LDL Chol Calc (NIH): 62 mg/dL (ref 0–99)
Triglycerides: 74 mg/dL (ref 0–149)
VLDL Cholesterol Cal: 15 mg/dL (ref 5–40)

## 2023-06-09 LAB — HEPATIC FUNCTION PANEL
ALT: 27 [IU]/L (ref 0–44)
AST: 30 [IU]/L (ref 0–40)
Albumin: 4.3 g/dL (ref 3.7–4.7)
Alkaline Phosphatase: 258 [IU]/L — ABNORMAL HIGH (ref 44–121)
Bilirubin Total: 1 mg/dL (ref 0.0–1.2)
Bilirubin, Direct: 0.56 mg/dL — ABNORMAL HIGH (ref 0.00–0.40)
Total Protein: 7.2 g/dL (ref 6.0–8.5)

## 2023-06-09 LAB — HEMOGLOBIN A1C
Est. average glucose Bld gHb Est-mCnc: 160 mg/dL
Hgb A1c MFr Bld: 7.2 % — ABNORMAL HIGH (ref 4.8–5.6)

## 2023-06-10 ENCOUNTER — Telehealth: Payer: Self-pay

## 2023-06-10 ENCOUNTER — Other Ambulatory Visit: Payer: Self-pay

## 2023-06-10 LAB — SPECIMEN STATUS REPORT

## 2023-06-10 NOTE — Telephone Encounter (Signed)
Called [facility] and added on GGT ([id number]) for elevated alkaline phosphatase. Per Dr Lorin Picket.

## 2023-06-14 ENCOUNTER — Ambulatory Visit (INDEPENDENT_AMBULATORY_CARE_PROVIDER_SITE_OTHER): Payer: Medicare Other | Admitting: Family Medicine

## 2023-06-14 VITALS — BP 121/67 | HR 71 | Temp 97.4°F | Ht 72.0 in | Wt 208.4 lb

## 2023-06-14 DIAGNOSIS — L989 Disorder of the skin and subcutaneous tissue, unspecified: Secondary | ICD-10-CM

## 2023-06-14 DIAGNOSIS — Z79899 Other long term (current) drug therapy: Secondary | ICD-10-CM

## 2023-06-14 DIAGNOSIS — E785 Hyperlipidemia, unspecified: Secondary | ICD-10-CM | POA: Diagnosis not present

## 2023-06-14 DIAGNOSIS — R748 Abnormal levels of other serum enzymes: Secondary | ICD-10-CM

## 2023-06-14 DIAGNOSIS — N1832 Chronic kidney disease, stage 3b: Secondary | ICD-10-CM

## 2023-06-14 DIAGNOSIS — Z794 Long term (current) use of insulin: Secondary | ICD-10-CM | POA: Diagnosis not present

## 2023-06-14 DIAGNOSIS — R42 Dizziness and giddiness: Secondary | ICD-10-CM

## 2023-06-14 DIAGNOSIS — I951 Orthostatic hypotension: Secondary | ICD-10-CM | POA: Diagnosis not present

## 2023-06-14 DIAGNOSIS — I10 Essential (primary) hypertension: Secondary | ICD-10-CM

## 2023-06-14 DIAGNOSIS — Z23 Encounter for immunization: Secondary | ICD-10-CM | POA: Diagnosis not present

## 2023-06-14 DIAGNOSIS — E1169 Type 2 diabetes mellitus with other specified complication: Secondary | ICD-10-CM | POA: Diagnosis not present

## 2023-06-14 DIAGNOSIS — E1122 Type 2 diabetes mellitus with diabetic chronic kidney disease: Secondary | ICD-10-CM | POA: Diagnosis not present

## 2023-06-14 MED ORDER — EMPAGLIFLOZIN 10 MG PO TABS: 10.0000 mg | ORAL_TABLET | Freq: Every day | ORAL | 5 refills | Status: DC

## 2023-06-14 NOTE — Progress Notes (Signed)
Subjective:    Patient ID: Jerry Burns, male    DOB: May 14, 1934, 89 y.o.   MRN: [id number]  Discussed the use of AI scribe software for clinical note transcription with the patient, who gave verbal consent to proceed.  History of Present Illness   The patient, with a history of diabetes, heart disease, and liver disease, presents with concerns about his blood sugar control and dizziness. He reports being very active and maintaining a healthy diet, but has noticed a weight loss of four pounds since his last visit. He has been monitoring his blood sugar levels, which are generally well-controlled, but tend to be higher later in the day. He has considered adjusting his insulin dosage to better manage these higher readings.  The patient also reports experiencing dizziness, which he attributes to his current medication regimen, including carvedilol, Jardiance, and isosorbide mononitrate. He has noticed that his heart occasionally "vibrates," a sensation he likens to his phone vibrating.  Additionally, the patient has been dealing with a persistent skin issue on his knee, which has been present for over a year. He expresses concern about potential cancer, but also acknowledges the presence of numerous veins in the area. He also reports some swelling in his lower legs, which he manages with torsemide four times a week.  Lastly, the patient has noticed an increased craving for milk, which he has been indulging in moderation. He expresses some concern about this new development, but also notes that he has researched it and believes it to be harmless.         Review of Systems     Objective:    Physical Exam   VITALS: BP- 118/68, BP- 108/62, BP- 92/60 CHEST: lungs clear to auscultation CARDIOVASCULAR: heart sounds normal EXTREMITIES: bilateral lower leg edema           Assessment & Plan:  Assessment and Plan    Diabetes Mellitus Improved A1c at 7.2. Noted higher blood sugars later  in the day. Patient requested to increase insulin dose at supper time. However, there are concerns about morning hypoglycemia. -Adjust insulin regimen:Increase supper time dose by 1 unit and decrease bedtime long-acting insulin by 2 units. -Continue monitoring blood sugars and adjust insulin as needed.  Orthostatic Hypotension Patient reports dizziness and a recent fall. Blood pressure drops significantly from lying to standing position. Patient is on multiple medications that could contribute to orthostatic hypotension. -Reduce Jardiance dose from 25mg  to 10mg  daily. -Consider further medication adjustments based on follow-up blood pressure checks.  Liver Enzyme Elevation Alkaline phosphatase and GGT elevated. Liver enzymes otherwise normal. -Order liver ultrasound to further evaluate. -Follow-up based on ultrasound results.  Dermatologic Concern Chronic non-healing skin lesions on lower extremity. -Refer to Dermatology for evaluation and possible biopsy.  Lower Extremity Edema Noted on physical exam. Patient is on Torsemide four times a week. -Continue current regimen of Torsemide.  Follow-up in 3-4 months.     1. Immunization due Today - Flu Vaccine Trivalent High Dose (Fluad)  2. Type 2 diabetes mellitus with stage 3b chronic kidney disease, with long-term current use of insulin (HCC) A1c under decent control he will bump up slightly the short acting insulin he uses at suppertime but decrease the evening to minimize the risk of low sugars in the morning his glucose readings were reviewed  3. Essential hypertension Blood pressures when I checked his blood pressure laying sitting and standing he is orthostatic we will need to reduce some of his medicines Given  that he does have underlying coronary artery ischemia we will leave the Imdur as it is Jardiance certainly helps with cardiac events but also could cause hypokalemia We will reduce the Jardiance from 25 mg down to 10 mg We  will notify his cardiologist as well as his nephrologist Patient will follow-up with [place] within 4 months  4. Dizziness Dizziness related to standing up consistent with blood pressure dropping see discussion above and below  5. High risk medication use Labs before next visit  6. Hyperlipidemia associated with type 2 diabetes mellitus (HCC) Continue current medication goal is to keep LDL below 70 when possible  7. Orthostatic hypotension In his particular situation I recommend reducing the Jardiance from 25 mg down to 10 mg Will touch base with his nephrologist and cardiologist with communications Patient will follow-up here in 4 months If not dramatically better in the next few weeks notify us and we will recheck  8. Skin lesion Follow-up with dermatology left side of the knee - Ambulatory referral to Dermatology  9. Elevated alkaline phosphatase level Ultrasound indicated Liver enzymes look good May need to get gastroenterology involved depending on result  - US Abdomen Limited RUQ (LIVER/GB)

## 2023-06-15 ENCOUNTER — Other Ambulatory Visit: Payer: Self-pay | Admitting: Family Medicine

## 2023-06-15 LAB — SPECIMEN STATUS REPORT

## 2023-06-15 LAB — GAMMA GT: GGT: 263 [IU]/L — ABNORMAL HIGH (ref 0–65)

## 2023-06-21 DIAGNOSIS — E875 Hyperkalemia: Secondary | ICD-10-CM | POA: Diagnosis not present

## 2023-06-21 DIAGNOSIS — I5030 Unspecified diastolic (congestive) heart failure: Secondary | ICD-10-CM | POA: Diagnosis not present

## 2023-06-21 DIAGNOSIS — E785 Hyperlipidemia, unspecified: Secondary | ICD-10-CM | POA: Diagnosis not present

## 2023-06-21 DIAGNOSIS — E1122 Type 2 diabetes mellitus with diabetic chronic kidney disease: Secondary | ICD-10-CM | POA: Diagnosis not present

## 2023-06-21 DIAGNOSIS — I129 Hypertensive chronic kidney disease with stage 1 through stage 4 chronic kidney disease, or unspecified chronic kidney disease: Secondary | ICD-10-CM | POA: Diagnosis not present

## 2023-06-21 DIAGNOSIS — N1832 Chronic kidney disease, stage 3b: Secondary | ICD-10-CM | POA: Diagnosis not present

## 2023-06-22 ENCOUNTER — Ambulatory Visit (HOSPITAL_COMMUNITY)
Admission: RE | Admit: 2023-06-22 | Discharge: 2023-06-22 | Disposition: A | Discharge status: Home / Self Care | Payer: Medicare Other | Source: Ambulatory Visit | Attending: Family Medicine | Admitting: Family Medicine

## 2023-06-22 DIAGNOSIS — K7689 Other specified diseases of liver: Secondary | ICD-10-CM | POA: Diagnosis not present

## 2023-06-22 DIAGNOSIS — K828 Other specified diseases of gallbladder: Secondary | ICD-10-CM | POA: Diagnosis not present

## 2023-06-22 DIAGNOSIS — K824 Cholesterolosis of gallbladder: Secondary | ICD-10-CM | POA: Diagnosis not present

## 2023-06-22 DIAGNOSIS — R748 Abnormal levels of other serum enzymes: Secondary | ICD-10-CM | POA: Diagnosis not present

## 2023-06-23 ENCOUNTER — Encounter: Payer: Self-pay | Admitting: Family Medicine

## 2023-06-23 ENCOUNTER — Other Ambulatory Visit: Payer: Self-pay

## 2023-06-23 DIAGNOSIS — H5703 Miosis: Secondary | ICD-10-CM | POA: Diagnosis not present

## 2023-06-23 DIAGNOSIS — I69398 Other sequelae of cerebral infarction: Secondary | ICD-10-CM | POA: Diagnosis not present

## 2023-06-23 DIAGNOSIS — I10 Essential (primary) hypertension: Secondary | ICD-10-CM

## 2023-06-23 DIAGNOSIS — E1169 Type 2 diabetes mellitus with other specified complication: Secondary | ICD-10-CM

## 2023-06-23 DIAGNOSIS — E1122 Type 2 diabetes mellitus with diabetic chronic kidney disease: Secondary | ICD-10-CM

## 2023-06-23 DIAGNOSIS — R748 Abnormal levels of other serum enzymes: Secondary | ICD-10-CM

## 2023-06-23 DIAGNOSIS — E119 Type 2 diabetes mellitus without complications: Secondary | ICD-10-CM | POA: Diagnosis not present

## 2023-06-23 DIAGNOSIS — H40053 Ocular hypertension, bilateral: Secondary | ICD-10-CM | POA: Diagnosis not present

## 2023-06-23 LAB — HM DIABETES EYE EXAM

## 2023-06-28 ENCOUNTER — Telehealth: Payer: Self-pay | Admitting: *Deleted

## 2023-06-28 NOTE — Telephone Encounter (Unsigned)
Copied from CRM [id number]. Topic: General - Other >> Jun 28, 2023  9:02 AM Dondra Prader A wrote: Reason for CRM: Pt called stating [facility] told him to show up for imaging and they did not have an order for imaging. I spoke with CAL to about imaging but stated Pt has labs ordered. Pt disconnected before I could get back to him. Please verify if Pt also need imaging.

## 2023-06-28 NOTE — Telephone Encounter (Signed)
The only ultrasound I am familiar with was the one we have ordered already please see result note I would not recommend additional ultrasounds currently I would recommend that he do his blood work before his next follow-up visit  If any other issues are going on let me know thanks

## 2023-06-30 ENCOUNTER — Inpatient Hospital Stay: Payer: Medicare Other | Attending: Hematology

## 2023-06-30 ENCOUNTER — Other Ambulatory Visit: Payer: Self-pay | Admitting: Family Medicine

## 2023-06-30 ENCOUNTER — Other Ambulatory Visit (HOSPITAL_COMMUNITY)
Admission: RE | Admit: 2023-06-30 | Discharge: 2023-06-30 | Disposition: A | Discharge status: Home / Self Care | Payer: Medicare Other | Source: Ambulatory Visit | Attending: Family Medicine | Admitting: Family Medicine

## 2023-06-30 DIAGNOSIS — Z86718 Personal history of other venous thrombosis and embolism: Secondary | ICD-10-CM | POA: Diagnosis not present

## 2023-06-30 DIAGNOSIS — D631 Anemia in chronic kidney disease: Secondary | ICD-10-CM | POA: Insufficient documentation

## 2023-06-30 DIAGNOSIS — R7989 Other specified abnormal findings of blood chemistry: Secondary | ICD-10-CM | POA: Insufficient documentation

## 2023-06-30 DIAGNOSIS — I10 Essential (primary) hypertension: Secondary | ICD-10-CM | POA: Insufficient documentation

## 2023-06-30 DIAGNOSIS — D649 Anemia, unspecified: Secondary | ICD-10-CM

## 2023-06-30 DIAGNOSIS — Z87891 Personal history of nicotine dependence: Secondary | ICD-10-CM | POA: Diagnosis not present

## 2023-06-30 DIAGNOSIS — I480 Paroxysmal atrial fibrillation: Secondary | ICD-10-CM | POA: Insufficient documentation

## 2023-06-30 DIAGNOSIS — N1831 Chronic kidney disease, stage 3a: Secondary | ICD-10-CM | POA: Diagnosis not present

## 2023-06-30 DIAGNOSIS — Z800 Family history of malignant neoplasm of digestive organs: Secondary | ICD-10-CM | POA: Diagnosis not present

## 2023-06-30 DIAGNOSIS — Z808 Family history of malignant neoplasm of other organs or systems: Secondary | ICD-10-CM | POA: Insufficient documentation

## 2023-06-30 DIAGNOSIS — Z7901 Long term (current) use of anticoagulants: Secondary | ICD-10-CM | POA: Diagnosis not present

## 2023-06-30 DIAGNOSIS — E611 Iron deficiency: Secondary | ICD-10-CM | POA: Insufficient documentation

## 2023-06-30 LAB — IRON AND TIBC
Iron: 74 ug/dL (ref 45–182)
Saturation Ratios: 19 % (ref 17.9–39.5)
TIBC: 388 ug/dL (ref 250–450)
UIBC: 314 ug/dL

## 2023-06-30 LAB — HEPATIC FUNCTION PANEL
ALT: 27 U/L (ref 0–44)
AST: 30 U/L (ref 15–41)
Albumin: 4.1 g/dL (ref 3.5–5.0)
Alkaline Phosphatase: 191 U/L — ABNORMAL HIGH (ref 38–126)
Bilirubin, Direct: 0.3 mg/dL — ABNORMAL HIGH (ref 0.0–0.2)
Indirect Bilirubin: 0.7 mg/dL (ref 0.3–0.9)
Total Bilirubin: 1 mg/dL (ref <1.2)
Total Protein: 8 g/dL (ref 6.5–8.1)

## 2023-06-30 LAB — VITAMIN B12: Vitamin B-12: 2463 pg/mL — ABNORMAL HIGH (ref 180–914)

## 2023-06-30 LAB — CBC WITH DIFFERENTIAL/PLATELET
Abs Immature Granulocytes: 0.01 10*3/uL (ref 0.00–0.07)
Basophils Absolute: 0 10*3/uL (ref 0.0–0.1)
Basophils Relative: 1 %
Eosinophils Absolute: 0.2 10*3/uL (ref 0.0–0.5)
Eosinophils Relative: 3 %
HCT: 39.2 % (ref 39.0–52.0)
Hemoglobin: 12.9 g/dL — ABNORMAL LOW (ref 13.0–17.0)
Immature Granulocytes: 0 %
Lymphocytes Relative: 15 %
Lymphs Abs: 0.9 10*3/uL (ref 0.7–4.0)
MCH: 32.6 pg (ref 26.0–34.0)
MCHC: 32.9 g/dL (ref 30.0–36.0)
MCV: 99 fL (ref 80.0–100.0)
Monocytes Absolute: 0.8 10*3/uL (ref 0.1–1.0)
Monocytes Relative: 14 %
Neutro Abs: 4 10*3/uL (ref 1.7–7.7)
Neutrophils Relative %: 67 %
Platelets: 137 10*3/uL — ABNORMAL LOW (ref 150–400)
RBC: 3.96 MIL/uL — ABNORMAL LOW (ref 4.22–5.81)
RDW: 14.7 % (ref 11.5–15.5)
WBC: 6 10*3/uL (ref 4.0–10.5)
nRBC: 0 % (ref 0.0–0.2)

## 2023-06-30 LAB — COMPREHENSIVE METABOLIC PANEL
ALT: 25 U/L (ref 0–44)
AST: 30 U/L (ref 15–41)
Albumin: 4.2 g/dL (ref 3.5–5.0)
Alkaline Phosphatase: 194 U/L — ABNORMAL HIGH (ref 38–126)
Anion gap: 10 (ref 5–15)
BUN: 60 mg/dL — ABNORMAL HIGH (ref 8–23)
CO2: 24 mmol/L (ref 22–32)
Calcium: 10 mg/dL (ref 8.9–10.3)
Chloride: 106 mmol/L (ref 98–111)
Creatinine, Ser: 1.51 mg/dL — ABNORMAL HIGH (ref 0.61–1.24)
GFR, Estimated: 44 mL/min — ABNORMAL LOW (ref >60)
Glucose, Bld: 181 mg/dL — ABNORMAL HIGH (ref 70–99)
Potassium: 5 mmol/L (ref 3.5–5.1)
Sodium: 140 mmol/L (ref 135–145)
Total Bilirubin: 1.1 mg/dL (ref <1.2)
Total Protein: 7.9 g/dL (ref 6.5–8.1)

## 2023-06-30 LAB — LIPID PANEL
Cholesterol: 117 mg/dL (ref 0–200)
HDL: 49 mg/dL (ref >40)
LDL Cholesterol: 46 mg/dL (ref 0–99)
Total CHOL/HDL Ratio: 2.4 {ratio}
Triglycerides: 112 mg/dL (ref <150)
VLDL: 22 mg/dL (ref 0–40)

## 2023-06-30 LAB — HEMOGLOBIN A1C
Hgb A1c MFr Bld: 7.3 % — ABNORMAL HIGH (ref 4.8–5.6)
Mean Plasma Glucose: 162.81 mg/dL

## 2023-06-30 LAB — BASIC METABOLIC PANEL
Anion gap: 11 (ref 5–15)
BUN: 59 mg/dL — ABNORMAL HIGH (ref 8–23)
CO2: 23 mmol/L (ref 22–32)
Calcium: 10 mg/dL (ref 8.9–10.3)
Chloride: 105 mmol/L (ref 98–111)
Creatinine, Ser: 1.52 mg/dL — ABNORMAL HIGH (ref 0.61–1.24)
GFR, Estimated: 44 mL/min — ABNORMAL LOW (ref >60)
Glucose, Bld: 177 mg/dL — ABNORMAL HIGH (ref 70–99)
Potassium: 4.4 mmol/L (ref 3.5–5.1)
Sodium: 139 mmol/L (ref 135–145)

## 2023-06-30 LAB — FERRITIN: Ferritin: 110 ng/mL (ref 24–336)

## 2023-06-30 NOTE — Telephone Encounter (Signed)
Left message to return call 

## 2023-07-01 ENCOUNTER — Telehealth: Payer: Self-pay | Admitting: *Deleted

## 2023-07-01 DIAGNOSIS — E1122 Type 2 diabetes mellitus with diabetic chronic kidney disease: Secondary | ICD-10-CM | POA: Diagnosis not present

## 2023-07-01 DIAGNOSIS — E1169 Type 2 diabetes mellitus with other specified complication: Secondary | ICD-10-CM | POA: Diagnosis not present

## 2023-07-01 DIAGNOSIS — E785 Hyperlipidemia, unspecified: Secondary | ICD-10-CM | POA: Diagnosis not present

## 2023-07-01 DIAGNOSIS — R748 Abnormal levels of other serum enzymes: Secondary | ICD-10-CM | POA: Diagnosis not present

## 2023-07-01 DIAGNOSIS — Z794 Long term (current) use of insulin: Secondary | ICD-10-CM | POA: Diagnosis not present

## 2023-07-01 DIAGNOSIS — I10 Essential (primary) hypertension: Secondary | ICD-10-CM | POA: Diagnosis not present

## 2023-07-01 DIAGNOSIS — N1832 Chronic kidney disease, stage 3b: Secondary | ICD-10-CM | POA: Diagnosis not present

## 2023-07-01 NOTE — Telephone Encounter (Signed)
Copied from CRM [id number]. Topic: General - Other >> Jul 01, 2023  8:24 AM Prudencio Pair wrote: Reason for CRM: Patient called in returning call from nurse Gabriana Wilmott. While reaching out to her, patient disconnected the call. Please give patient a call.

## 2023-07-01 NOTE — Telephone Encounter (Signed)
Duplicate- see other message

## 2023-07-02 LAB — LIPID PANEL
Chol/HDL Ratio: 2.5 {ratio} (ref 0.0–5.0)
Cholesterol, Total: 116 mg/dL (ref 100–199)
HDL: 47 mg/dL (ref >39)
LDL Chol Calc (NIH): 49 mg/dL (ref 0–99)
Triglycerides: 108 mg/dL (ref 0–149)
VLDL Cholesterol Cal: 20 mg/dL (ref 5–40)

## 2023-07-02 LAB — BASIC METABOLIC PANEL
BUN/Creatinine Ratio: 45 — ABNORMAL HIGH (ref 10–24)
BUN: 62 mg/dL — ABNORMAL HIGH (ref 8–27)
CO2: 20 mmol/L (ref 20–29)
Calcium: 9.7 mg/dL (ref 8.6–10.2)
Chloride: 103 mmol/L (ref 96–106)
Creatinine, Ser: 1.37 mg/dL — ABNORMAL HIGH (ref 0.76–1.27)
Glucose: 220 mg/dL — ABNORMAL HIGH (ref 70–99)
Potassium: 4.2 mmol/L (ref 3.5–5.2)
Sodium: 140 mmol/L (ref 134–144)
eGFR: 50 mL/min/{1.73_m2} — ABNORMAL LOW (ref >59)

## 2023-07-02 LAB — HEPATIC FUNCTION PANEL
ALT: 21 [IU]/L (ref 0–44)
AST: 29 [IU]/L (ref 0–40)
Albumin: 4.1 g/dL (ref 3.7–4.7)
Alkaline Phosphatase: 233 [IU]/L — ABNORMAL HIGH (ref 44–121)
Bilirubin Total: 0.8 mg/dL (ref 0.0–1.2)
Bilirubin, Direct: 0.49 mg/dL — ABNORMAL HIGH (ref 0.00–0.40)
Total Protein: 7 g/dL (ref 6.0–8.5)

## 2023-07-02 LAB — METHYLMALONIC ACID, SERUM: Methylmalonic Acid, Quantitative: 310 nmol/L (ref 0–378)

## 2023-07-02 LAB — HEMOGLOBIN A1C
Est. average glucose Bld gHb Est-mCnc: 160 mg/dL
Hgb A1c MFr Bld: 7.2 % — ABNORMAL HIGH (ref 4.8–5.6)

## 2023-07-04 ENCOUNTER — Other Ambulatory Visit: Payer: Self-pay | Admitting: Cardiology

## 2023-07-04 DIAGNOSIS — I4891 Unspecified atrial fibrillation: Secondary | ICD-10-CM

## 2023-07-05 NOTE — Telephone Encounter (Addendum)
Patient states he was notified via the call center and had his lab work drawn last week

## 2023-07-05 NOTE — Telephone Encounter (Signed)
Prescription refill request for Eliquis received. Indication: AF Last office visit: 04/09/23  Dominga Ferry MD Scr: 1.37 on 07/01/23 Age: 89 Weight: 95.7kg  Scr has consistently been 1.5 or greater.  Will continue current dose of Eliquis 2.5mg  twice daily.  Refill approved.

## 2023-07-06 ENCOUNTER — Encounter: Payer: Self-pay | Admitting: Podiatry

## 2023-07-06 ENCOUNTER — Ambulatory Visit (INDEPENDENT_AMBULATORY_CARE_PROVIDER_SITE_OTHER): Payer: Medicare Other | Admitting: Podiatry

## 2023-07-06 DIAGNOSIS — M79675 Pain in left toe(s): Secondary | ICD-10-CM | POA: Diagnosis not present

## 2023-07-06 DIAGNOSIS — B351 Tinea unguium: Secondary | ICD-10-CM | POA: Diagnosis not present

## 2023-07-06 DIAGNOSIS — M79674 Pain in right toe(s): Secondary | ICD-10-CM

## 2023-07-06 DIAGNOSIS — L84 Corns and callosities: Secondary | ICD-10-CM

## 2023-07-06 DIAGNOSIS — E1151 Type 2 diabetes mellitus with diabetic peripheral angiopathy without gangrene: Secondary | ICD-10-CM

## 2023-07-06 DIAGNOSIS — L820 Inflamed seborrheic keratosis: Secondary | ICD-10-CM | POA: Diagnosis not present

## 2023-07-06 DIAGNOSIS — I872 Venous insufficiency (chronic) (peripheral): Secondary | ICD-10-CM | POA: Diagnosis not present

## 2023-07-06 NOTE — Progress Notes (Unsigned)
[facility] [address] [place] [place] [address]   CLINIC:  Medical Oncology/Hematology  PCP:  Jerry Sciara, MD [address] [place] [address] [phone]   REASON FOR VISIT:  Follow-up for iron deficiency anemia   PRIOR THERAPY: None   CURRENT THERAPY: Intermittent Feraheme (last on 10/13/2019); oral iron supplementation  INTERVAL HISTORY:   Jerry Burns 89 y.o. male returns for routine follow-up of iron deficiency anemia.  He was last seen on 07/14/2022 by Jerry Brenner PA-C.  His last IV iron infusion was on 10/13/2019.   At today's visit, he reports feeling fair. *** *** He denies any bleeding such as hematemesis, epistaxis, hematochezia, or melena.   ***He reports significant progressive fatigue over the past 6 months, reports that he now has to stop halfway up his flight of stairs (16 steps) at home, is walking slower, and feels unsteady on his feet.   ***He continues to take Eliquis for atrial fibrillation. ***He had some intermittent chest pain that resolved after being started on Imdur by his cardiologist.  *** Denies any recent chest pain *** ***He denies any ice pica.  ***He remains on vitamin B12 and ferrous sulfate 325 mg every other day along with stool softener.   ***He does not have any symptoms of recurrent DVT or PE.  He has ***% energy and ***% appetite. He endorses that he is maintaining a stable weight.   ASSESSMENT & PLAN:  1.   Normocytic anemia from CKD and iron deficiency - Thought to be from GI blood loss and anemia of CKD (CKD stage IIIa) - He had a hemoglobin of 9.7 in September 2019 followed by a GI work-up. - He had an EGD on 05/18/2018 showed erosive reflux esophagitis, erosive gastropathy.  Biopsy was consistent with chronic active gastritis with H. pylori.  It was thought that he could have been bleeding from upper GI. - Colonoscopy on 05/18/2018 showed diverticulosis of the sigmoid colon, descending colon  and transverse colon.  Tubular adenoma from hepatic flexure was resected. - During his initial work up, stool for occult blood x3 was negative.  Direct Coombs test and LDH were normal.  B12 and folic acid were normal.  SPEP normal. - Last Feraheme on 10/06/2019 and 10/13/2019. - He is taking oral ferrous sulfate 3 times per week and is tolerating it well. *** Also takes vitamin B12 supplements at home.*** - No bright red blood per rectum or melena*** - She takes Eliquis for atrial fibrillation. - He has chronic fatigue, which he reports is slightly improved from previous*** - Most recent labs (06/30/2023):  Hgb 12.9/MCV 99.0, platelets 137 (possible cirrhosis) Ferritin 110, iron saturation 19% Creatinine 1.51/GFR 44 Vitamin B12 2463, MMA 310 - PLAN: Offered discharge to PCP, the patient prefers annual follow-up at [facility]. - Continue oral iron supplementation.  Recommend DECREASING vitamin B12 supplementation. - Patient educated on alarm symptoms that would warrant more immediate medical attention or return visit.   2.  Distal DVT: - He had an ultrasound on 09/03/2018 showed possible nonocclusive thrombus of the intermediate acuity within 1 of the posterior tibial veins in the right calf. - A CT scan of the chest PE protocol could not be done as he is unable to lie flat at that time - He completed Eliquis x6 months (restarted Eliquis in 2023 due to new diagnosis of A-fib) - No current signs or symptoms of recurrent DVT ***   3.  Liver abnormalities -  Recently seen for GI consultation due to elevated LFTs and some nodularity of the liver on recent ultrasound that was suspicious for possible cirrhosis.   4.  Other history - PMH includes coronary artery disease, paroxysmal atrial fibrillation (Eliquis), congestive heart failure, diabetes, hypertension, stroke  PLAN SUMMARY: >> Labs in 1 year = CBC/D, CMP, ferritin, iron/TIBC, B12, MMA >> Office visit 1 week after labs ***   [name] **VISIT SUMMARY & IMPORTANT INSTRUCTIONS **   You were seen today by Jerry Brenner PA-C for your ***.    *** ***  *** ***  LABS: Return in ***   OTHER TESTS: ***  MEDICATIONS: ***  FOLLOW-UP APPOINTMENT: ***     REVIEW OF SYSTEMS: ***  Review of Systems - Oncology   PHYSICAL EXAM:  ECOG PERFORMANCE STATUS: {CHL ONC ECOG XB:[id number]} *** There were no vitals filed for this visit. There were no vitals filed for this visit. Physical Exam  PAST MEDICAL/SURGICAL HISTORY:  Past Medical History:  Diagnosis Date   Anginal pain (HCC)    Aortic atherosclerosis (HCC) 10/06/2020   Seen on CAT scan 2021   Arthritis    Bilateral knee   Atrial fibrillation (HCC) 12/28/2021   On Eliquis   CHF (congestive heart failure) (HCC)    Coronary artery disease    Diabetes mellitus    insulin dependent    DVT, lower extremity, distal, acute (HCC) 09/09/2018   Hyperlipidemia    Hypertension    Progressive angina (HCC) 03/2015   a. s/p DES to proximal LAD and balloon angioplasty to distal LAD in 2016 b. patent stent by repeat cath in 01/2017 with mild residual CAD)   Pulmonary nodule 10/06/2020   Seen on CAT scan 2020 1 repeat CAT scan in 2022   Stroke [facility])    Past Surgical History:  Procedure Laterality Date   APPENDECTOMY     CARDIAC CATHETERIZATION N/A 04/12/2015   Procedure: Left Heart Cath and Coronary Angiography;  Surgeon: Tonny Bollman, MD; pLAD 75>0% w/ 3.0x12 mm Resolute DES, dLAD 80>0% w/ Angiosculpt scoring balloon, CFX luminal irreg, RCA mild dz, EF 55-65%    COLONOSCOPY  2011   RMR: 1. Normal rectum 2. Left sided diverticula , 2 ascending colon polyps, status post snare polypectomy. Remainder of colonic mucosa appeared unremarkable.    COLONOSCOPY N/A 05/13/2016   Procedure: COLONOSCOPY;  Surgeon: Corbin Ade, MD;  Location: [facility];  Service: Endoscopy;  Laterality: N/A;  7:30 AM   COLONOSCOPY N/A 05/18/2018   Rourk:  Diverticulosis, single tubular adenoma removed.  No plans for future screening or surveillance colonoscopy due to age.   CORONARY ANGIOGRAPHY N/A 02/04/2017   Procedure: CORONARY ANGIOGRAPHY (CATH LAB);  Surgeon: Lennette Bihari, MD;  Location: [facility] INVASIVE CV LAB;  Service: Cardiovascular;  Laterality: N/A;   CORONARY ANGIOPLASTY WITH STENT PLACEMENT  08/22/2014   PTCA/DES X mLAD with 2.5 x 16 mm Promus Premier DES by Dr Sanjuana Kava   ESOPHAGOGASTRODUODENOSCOPY N/A 05/18/2018   Rourk: Ulcerative reflux esophagitis, hiatal hernia, erosive gastropathy with H. pylori on biopsy, also in the setting of NSAID use.   FOOT SURGERY Right    LEFT HEART CATHETERIZATION WITH CORONARY ANGIOGRAM N/A 08/22/2014   Procedure: LEFT HEART CATHETERIZATION WITH CORONARY ANGIOGRAM;  Surgeon: Kathleene Hazel, MD;  Location: [facility] CATH LAB;  Service: Cardiovascular;  Laterality: N/A;   PERCUTANEOUS CORONARY STENT INTERVENTION (PCI-S)  08/22/2014   Procedure: PERCUTANEOUS CORONARY STENT INTERVENTION (PCI-S);  Surgeon: Kathleene Hazel, MD;    SHOULDER SURGERY     TONSILLECTOMY      SOCIAL HISTORY:  Social History   Socioeconomic History   Marital status: Married    Spouse name: Not on file   Number of children: Not on file   Years of education: 14   Highest education level: Some college, no degree  Occupational History   Occupation: retired  Tobacco Use   Smoking status: Former    Current packs/day: 0.00    Average packs/day: 1 pack/day for 52.0 years (52.0 ttl pk-yrs)    Types: Cigarettes, Cigars    Start date: 08/10/1955    Quit date: 07/28/2007    Years since quitting: 15.9    Passive exposure: Never   Smokeless tobacco: Never  Vaping Use   Vaping status: Never Used  Substance and Sexual Activity   Alcohol use: Yes    Comment: Daily about 1 glass of wine and gin   Drug use: No   Sexual activity: Not Currently  Other Topics Concern   Not on file  Social History Narrative   Not on file    Social Determinants of Health   Financial Resource Strain: Low Risk  (06/11/2023)   Overall Financial Resource Strain (CARDIA)    Difficulty of Paying Living Expenses: Not very hard  Food Insecurity: No Food Insecurity (06/11/2023)   Hunger Vital Sign    Worried About Running Out of Food in the Last Year: Never true    Ran Out of Food in the Last Year: Never true  Transportation Needs: Unmet Transportation Needs (06/11/2023)   PRAPARE - Administrator, Civil Service (Medical): Yes    Lack of Transportation (Non-Medical): No  Physical Activity: Insufficiently Active (06/11/2023)   Exercise Vital Sign    Days of Exercise per Week: 3 days    Minutes of Exercise per Session: 20 min  Stress: Stress Concern Present (06/11/2023)   [employer] of Occupational Health - Occupational Stress Questionnaire    Feeling of Stress : Rather much  Social Connections: Moderately Isolated (06/11/2023)   Social Connection and Isolation Panel [NHANES]    Frequency of Communication with Friends and Family: More than three times a week    Frequency of Social Gatherings with Friends and Family: Once a week    Attends Religious Services: Never    Database administrator or Organizations: No    Attends Engineer, structural: Not on file    Marital Status: Married  Catering manager Violence: Not At Risk (05/07/2022)   Humiliation, Afraid, Rape, and Kick questionnaire    Fear of Current or Ex-Partner: No    Emotionally Abused: No    Physically Abused: No    Sexually Abused: No    FAMILY HISTORY:  Family History  Problem Relation Age of Onset   Diabetes Other    Diabetes Brother    Colon cancer Maternal Uncle    Skin cancer Mother    Diabetes Father    Hypertension Sister    Skin cancer Sister     CURRENT MEDICATIONS:  Outpatient Encounter Medications as of 07/07/2023  Medication Sig   Ascorbic Acid (VITAMIN C) 1000 MG tablet Take 1,000 mg by mouth daily.     atorvastatin (LIPITOR) 80 MG tablet TAKE (1) TABLET BY MOUTH ONCE DAILY.   augmented betamethasone dipropionate (DIPROLENE-AF) 0.05 % ointment Apply topically 2 (two) times daily as needed.   B-D UF III MINI PEN NEEDLES 31G  X 5 MM MISC SMARTSIG:1 Each SUB-Q Twice Daily   BD PEN NEEDLE NANO 2ND GEN 32G X 4 MM MISC USE 1 PEN NEEDLE FIVE TIMES DAILY   carvedilol (COREG) 3.125 MG tablet Take 1 tablet (3.125 mg total) by mouth 2 (two) times daily with a meal.   COMBIGAN 0.2-0.5 % ophthalmic solution Place 1 drop into the left eye 2 (two) times daily.    Cyanocobalamin (B-12) 2500 MCG TABS Take 1 tablet by mouth 2 (two) times a week. Three times a week on Monday Wednesday and Friday   ELIQUIS 2.5 MG TABS tablet TAKE (1) TABLET BY MOUTH TWICE DAILY.   empagliflozin (JARDIANCE) 10 MG TABS tablet Take 1 tablet (10 mg total) by mouth daily before breakfast.   erythromycin ophthalmic ointment Place 1 application  into the left eye 3 (three) times daily as needed (infection).   Flaxseed, Linseed, (FLAXSEED OIL PO) Take 1 capsule by mouth daily.    glucose blood (CONTOUR NEXT TEST) test strip USE 1 STRIP TO CHECK GLUCOSE THREE TIMES DAILY   HUMALOG KWIKPEN 100 UNIT/ML KwikPen INJECT TWICE DAILY PER SLIDING SCALE FOLLOWING BREAKFAST AND DINNER: <85=0UNITS, 85-110=3 UNITS, 111-160 = 5, 161-220 = 7, >221 = 9 UNITS   isosorbide mononitrate (IMDUR) 30 MG 24 hr tablet Take 1 tablet (30 mg total) by mouth daily.   ketoconazole (NIZORAL) 2 % cream Apply 1 Application topically 2 (two) times daily.   Lactobacillus (PROBIOTIC ACIDOPHILUS) CAPS Take 1 capsule by mouth daily at 6 (six) AM.   LANTUS SOLOSTAR 100 UNIT/ML Solostar Pen INJECT 24 TO 25 UNITS SUBCUTANEOUSLY ONCE DAILY AT 10AM.   Multiple Vitamin (MULTIVITAMIN) tablet Take 1 tablet by mouth daily.   nitroGLYCERIN (NITROSTAT) 0.4 MG SL tablet PLACE 1 TAB UNDER TONGUE EVERY 5 MIN IF NEEDED FOR CHEST PAIN. MAY USE 3 TIMES.NO RELIEF CALL 911.   polyethylene glycol  powder (MIRALAX) 17 GM/SCOOP powder Start by taking 1/2 capful mixed in 4 ounces of fluid daily. You can increase to 1 capful daily if needed to maintain easy BMs.   Polyvinyl Alcohol-Povidone (REFRESH OP) Place 1 drop into both ears 2 (two) times daily as needed (dryness).   tamsulosin (FLOMAX) 0.4 MG CAPS capsule Take 2 capsules po at bedtime. if this causes drowsiness or dizziness to reduce it to just 1/day   torsemide (DEMADEX) 20 MG tablet TAKE 1 TO 2 TABLETS BY MOUTH EACH MORNING AS DIRECTED FOR SWELLING IN THE LEGS.   Turmeric 500 MG CAPS Take 500 mg by mouth daily.   VELTASSA 8.4 g packet Take 1 packet by mouth daily.   No facility-administered encounter medications on file as of 07/07/2023.    ALLERGIES:  Allergies  Allergen Reactions   Adhesive [Tape]     bandaids cause blisters after leaving on for 24 hours    Sulfa Antibiotics Other (See Comments)    "was terrible" "eye went crazy"   Latex Other (See Comments)    blisters    LABORATORY DATA:  I have reviewed the labs as listed.  CBC    Component Value Date/Time   WBC 6.0 06/30/2023 1040   RBC 3.96 (L) 06/30/2023 1040   HGB 12.9 (L) 06/30/2023 1040   HGB 12.9 (L) 12/31/2021 0849   HCT 39.2 06/30/2023 1040   HCT 39.4 12/31/2021 0849   PLT 137 (L) 06/30/2023 1040   PLT 145 (L) 12/31/2021 0849   MCV 99.0 06/30/2023 1040   MCV 95 12/31/2021 0849   MCH 32.6 06/30/2023  1040   MCHC 32.9 06/30/2023 1040   RDW 14.7 06/30/2023 1040   RDW 12.4 12/31/2021 0849   LYMPHSABS 0.9 06/30/2023 1040   LYMPHSABS 0.8 12/11/2019 1037   MONOABS 0.8 06/30/2023 1040   EOSABS 0.2 06/30/2023 1040   EOSABS 0.3 12/11/2019 1037   BASOSABS 0.0 06/30/2023 1040   BASOSABS 0.1 12/11/2019 1037      Latest Ref Rng & Units 07/01/2023   12:02 PM 06/30/2023   11:01 AM 06/30/2023   10:40 AM  CMP  Glucose 70 - 99 mg/dL 409  811  914   BUN 8 - 27 mg/dL 62  59  60   Creatinine 0.76 - 1.27 mg/dL 7.82  9.56  2.13   Sodium 134 - 144 mmol/L 140  139   140   Potassium 3.5 - 5.2 mmol/L 4.2  4.4  5.0   Chloride 96 - 106 mmol/L 103  105  106   CO2 20 - 29 mmol/L 20  23  24    Calcium 8.6 - 10.2 mg/dL 9.7  08.6  57.8   Total Protein 6.0 - 8.5 g/dL 7.0  8.0  7.9   Total Bilirubin 0.0 - 1.2 mg/dL 0.8  1.0  1.1   Alkaline Phos 44 - 121 IU/L 233  191  194   AST 0 - 40 IU/L 29  30  30    ALT 0 - 44 IU/L 21  27  25      DIAGNOSTIC IMAGING:  I have independently reviewed the relevant imaging and discussed with the patient.   WRAP UP:  All questions were answered. The patient knows to call the clinic with any problems, questions or concerns.  Medical decision making: ***  Time spent on visit: I spent *** minutes counseling the patient face to face. The total time spent in the appointment was *** minutes and more than 50% was on counseling.  Carnella Guadalajara, PA-C  ***

## 2023-07-06 NOTE — Progress Notes (Unsigned)
Subjective:  Patient ID: Jerry Burns, male    DOB: [birth year],  MRN: [id number]  Jerry Burns presents to clinic today for:  Chief Complaint  Patient presents with   [facility]    He is here for foot care and no concerns.    Patient notes nails are thick and elongated, causing pain in shoe gear when ambulating.  He has painful calluses bilateral submet 5.  He is interested in a new pair of diabetic shoes.  PCP is Babs Sciara, MD. date last seen was 02/10/2023  Past Medical History:  Diagnosis Date   Anginal pain (HCC)    Aortic atherosclerosis (HCC) 10/06/2020   Seen on CAT scan 2021   Arthritis    Bilateral knee   Atrial fibrillation (HCC) 12/28/2021   On Eliquis   CHF (congestive heart failure) (HCC)    Coronary artery disease    Diabetes mellitus    insulin dependent    DVT, lower extremity, distal, acute (HCC) 09/09/2018   Hyperlipidemia    Hypertension    Progressive angina (HCC) 03/2015   a. s/p DES to proximal LAD and balloon angioplasty to distal LAD in 2016 b. patent stent by repeat cath in 01/2017 with mild residual CAD)   Pulmonary nodule 10/06/2020   Seen on CAT scan 2020 1 repeat CAT scan in 2022   Stroke [facility])     Allergies  Allergen Reactions   Adhesive [Tape]     bandaids cause blisters after leaving on for 24 hours    Sulfa Antibiotics Other (See Comments)    "was terrible" "eye went crazy"   Latex Other (See Comments)    blisters    Objective:  Jerry Burns is a pleasant 89 y.o. male in NAD. AAO x 3.  Vascular Examination: Patient has palpable DP pulse, absent PT pulse bilateral.  Delayed capillary refill bilateral toes.  Sparse digital hair bilateral.  Proximal to distal cooling WNL bilateral.    Dermatological Examination: Interspaces are clear with no open lesions noted bilateral.  Skin is shiny and atrophic bilateral.  Nails are 3-42mm thick, with yellowish/brown discoloration, subungual debris and distal onycholysis x10.  There is  pain with compression of nails x10.  There are hyperkeratotic lesions noted bilateral submet 5 with pain on palpation of the lesions.     Latest Ref Rng & Units 07/01/2023   12:02 PM 06/30/2023   11:01 AM 06/08/2023    7:46 AM 02/03/2023    3:01 PM 10/12/2022    9:37 AM  Hemoglobin A1C  Hemoglobin-A1c 4.8 - 5.6 % 7.2  7.3  7.2  7.5  7.7    Patient qualifies for at-risk foot care because of diabetes with PVD.  Assessment/Plan: 1. Pain due to onychomycosis of toenails of both feet   2. Corns   3. Type II diabetes mellitus with peripheral circulatory disorder (HCC)     FOR HOME USE ONLY DME DIABETIC SHOE.  Order written for 1 pair of diabetic shoes and 3 pairs of custom diabetic insoles with the fifth metatarsal heads offloaded to decrease callus formation and offload any pressure to these areas.  Mycotic nails x10 were sharply debrided with sterile nail nippers and power debriding burr to decrease bulk and length.  Hyperkeratotic lesions x 2 were shaved with #312 blade.  Return in about 3 months (around 10/04/2023) for [facility].   Clerance Lav, DPM, FACFAS [facility]     [address]  [place], [place] [address]                Office [phone]  Fax [phone]

## 2023-07-07 ENCOUNTER — Inpatient Hospital Stay (HOSPITAL_BASED_OUTPATIENT_CLINIC_OR_DEPARTMENT_OTHER): Payer: Medicare Other | Admitting: Physician Assistant

## 2023-07-07 ENCOUNTER — Other Ambulatory Visit: Payer: Self-pay | Admitting: Family Medicine

## 2023-07-07 ENCOUNTER — Encounter: Payer: Self-pay | Admitting: Physician Assistant

## 2023-07-07 ENCOUNTER — Ambulatory Visit: Payer: Medicare Other | Admitting: Physician Assistant

## 2023-07-07 VITALS — BP 127/65 | HR 63 | Temp 98.6°F | Resp 18 | Wt 209.8 lb

## 2023-07-07 DIAGNOSIS — Z7901 Long term (current) use of anticoagulants: Secondary | ICD-10-CM | POA: Diagnosis not present

## 2023-07-07 DIAGNOSIS — N1831 Chronic kidney disease, stage 3a: Secondary | ICD-10-CM

## 2023-07-07 DIAGNOSIS — D649 Anemia, unspecified: Secondary | ICD-10-CM

## 2023-07-07 DIAGNOSIS — D631 Anemia in chronic kidney disease: Secondary | ICD-10-CM | POA: Diagnosis not present

## 2023-07-07 DIAGNOSIS — R7989 Other specified abnormal findings of blood chemistry: Secondary | ICD-10-CM | POA: Diagnosis not present

## 2023-07-07 DIAGNOSIS — E611 Iron deficiency: Secondary | ICD-10-CM | POA: Diagnosis not present

## 2023-07-07 DIAGNOSIS — I480 Paroxysmal atrial fibrillation: Secondary | ICD-10-CM | POA: Diagnosis not present

## 2023-07-07 NOTE — Patient Instructions (Signed)
[facility] at [facility] Discharge Instructions  You were seen today by Rojelio Brenner PA-C for your iron deficiency anemia.  Your blood and iron levels looks great today!  We will check labs and see you for an office visit in 1 year.  In the meantime... DECREASE your Vitamin B12 to take it only TWICE weekly. START taking over-the-counter Gentle Iron or Slow Fe.  Take this every other day.  ** Thank you for trusting me with your healthcare!  I strive to provide all of my patients with quality care at each visit.  If you receive a survey for this visit, I would be so grateful to you for taking the time to provide feedback.  Thank you in advance!  ~ Freeland Pracht                   Dr. Doreatha Massed   &   Rojelio Brenner, PA-C   - - - - - - - - - - - - - - - - - -     Thank you for choosing [facility] at [facility] to provide your oncology and hematology care.  To afford each patient quality time with our provider, please arrive at least 15 minutes before your scheduled appointment time.   If you have a lab appointment with the Cancer Center please come in thru the Main Entrance and check in at the main information desk.  You need to re-schedule your appointment should you arrive 10 or more minutes late.  We strive to give you quality time with our providers, and arriving late affects you and other patients whose appointments are after yours.  Also, if you no show three or more times for appointments you may be dismissed from the clinic at the providers discretion.     Again, thank you for choosing [facility].  Our hope is that these requests will decrease the amount of time that you wait before being seen by our physicians.       _____________________________________________________________  Should you have questions after your visit to [facility], please contact our office at [phone] and follow the prompts.   Our office hours are 8:00 a.m. and 4:30 p.m. Monday - Friday.  Please note that voicemails left after 4:00 p.m. may not be returned until the following business day.  We are closed weekends and major holidays.  You do have access to a nurse 24-7, just call the main number to the clinic [phone] and do not press any options, hold on the line and a nurse will answer the phone.    For prescription refill requests, have your pharmacy contact our office and allow 72 hours.    Due to Covid, you will need to wear a mask upon entering the hospital. If you do not have a mask, a mask will be given to you at the Main Entrance upon arrival. For doctor visits, patients may have 1 support person age 20 or older with them. For treatment visits, patients can not have anyone with them due to social distancing guidelines and our immunocompromised population.

## 2023-07-15 ENCOUNTER — Inpatient Hospital Stay: Payer: Medicare Other | Admitting: Licensed Clinical Social Worker

## 2023-07-15 DIAGNOSIS — D649 Anemia, unspecified: Secondary | ICD-10-CM

## 2023-07-15 NOTE — Progress Notes (Signed)
[facility] CSW Progress Note  Clinical Child psychotherapist contacted patient by phone to discuss [employer].  CSW booked and appointment w/ pt to complete Advance Directives on 12/26.    Rachel Moulds, LCSW Clinical Social Worker [facility]

## 2023-07-22 ENCOUNTER — Other Ambulatory Visit: Payer: Medicare Other | Admitting: Licensed Clinical Social Worker

## 2023-07-22 ENCOUNTER — Inpatient Hospital Stay: Payer: Medicare Other | Admitting: Licensed Clinical Social Worker

## 2023-07-30 ENCOUNTER — Encounter: Payer: Self-pay | Admitting: Family Medicine

## 2023-08-09 ENCOUNTER — Ambulatory Visit: Payer: Medicare Other

## 2023-08-09 DIAGNOSIS — E1151 Type 2 diabetes mellitus with diabetic peripheral angiopathy without gangrene: Secondary | ICD-10-CM

## 2023-08-09 DIAGNOSIS — L84 Corns and callosities: Secondary | ICD-10-CM

## 2023-08-09 DIAGNOSIS — M2141 Flat foot [pes planus] (acquired), right foot: Secondary | ICD-10-CM

## 2023-08-09 NOTE — Progress Notes (Signed)
 Patient presents to the office today for diabetic shoe and insole measuring.  Patient was measured with brannock device to determine size and width for 1 pair of extra depth shoes and foam casted for 3 pair of insoles.   Documentation of medical necessity will be sent to patient's treating diabetic doctor to verify and sign.   Patient's diabetic provider: Glendia Fielding   Shoes and insoles will be ordered at that time and patient will be notified for an appointment for fitting when they arrive.   Shoe size (per patient): 12 Brannock measurement: 12 Patient shoe selection- Shoe choice:   585 /[id number] Shoe size ordered: 12WD ABN signed  Lolita Schultze CPed, CFo, CFm

## 2023-08-11 ENCOUNTER — Inpatient Hospital Stay: Payer: Medicare Other | Attending: Hematology | Admitting: Licensed Clinical Social Worker

## 2023-08-11 DIAGNOSIS — D649 Anemia, unspecified: Secondary | ICD-10-CM

## 2023-08-11 NOTE — Progress Notes (Signed)
 [facility] Healthcare Advance Directives Clinical Social Work  Patient presented to Advance Directives Clinic  to review and complete healthcare advance directives.  Clinical Social Worker met with patient.  The patient designated his wife, Chyler Fout [phone]), as their primary healthcare agent and his son, Ram Creviston [phone], as their secondary agent.  Patient also completed healthcare living will.    Documents were notarized and copies made for patient/family. Clinical Social Worker will send documents to medical records to be scanned into patient's chart. Clinical Social Worker encouraged patient/family to contact with any additional questions or concerns.   Quenton Bruns, LCSW Clinical Social Worker [facility]

## 2023-08-12 NOTE — Telephone Encounter (Signed)
Per protocol Unable to respond regarding her situation on his chart Therefore message was sent over to a telephone message please see regarding his wife Jerry Burns

## 2023-08-12 NOTE — Telephone Encounter (Signed)
Nurses A few different things  #1-birth oh would need to be the one to make the decision regarding hospice  #2-if she is on board with hospice evaluation then the next step would be consultation with and a referral with [facility] to have them come and do an evaluation She may actually be more of a candidate for palliative care  #3-unfortunately-my understanding hospice will only step in if a person has less than 6 months to live.  I certainly understand her having increased pain unable to get up but would not be likely to qualify for hospice Typically individuals with hospice care are cancer patient's end-stage heart disease in stage COPD end-stage dementia etc.  Please call patient-might be beneficial to have social service worker consulted as well If he would like to have consult with Gertie Exon it would be reasonable to see if they could do a consult more so for palliative care thank you

## 2023-08-23 ENCOUNTER — Other Ambulatory Visit: Payer: Self-pay | Admitting: Family Medicine

## 2023-09-08 DIAGNOSIS — L858 Other specified epidermal thickening: Secondary | ICD-10-CM | POA: Diagnosis not present

## 2023-09-08 DIAGNOSIS — I8310 Varicose veins of unspecified lower extremity with inflammation: Secondary | ICD-10-CM | POA: Diagnosis not present

## 2023-09-14 ENCOUNTER — Telehealth: Payer: Self-pay

## 2023-09-14 NOTE — Telephone Encounter (Signed)
We moved Jerry Burns appt to April 18th due to weather need blood work ordered for that appt

## 2023-09-14 NOTE — Telephone Encounter (Signed)
A1c, lipid, metabolic 7, urine ACR Diagnosis diabetes, hyperlipidemia, CKD

## 2023-09-15 ENCOUNTER — Other Ambulatory Visit: Payer: Self-pay

## 2023-09-15 DIAGNOSIS — I10 Essential (primary) hypertension: Secondary | ICD-10-CM

## 2023-09-15 DIAGNOSIS — E1169 Type 2 diabetes mellitus with other specified complication: Secondary | ICD-10-CM

## 2023-09-15 DIAGNOSIS — E1122 Type 2 diabetes mellitus with diabetic chronic kidney disease: Secondary | ICD-10-CM

## 2023-09-15 NOTE — Telephone Encounter (Signed)
 Labs have been ordered and pt has been notified.

## 2023-09-17 ENCOUNTER — Ambulatory Visit: Payer: Medicare Other | Admitting: Family Medicine

## 2023-09-30 ENCOUNTER — Ambulatory Visit: Payer: Medicare Other

## 2023-09-30 DIAGNOSIS — M2141 Flat foot [pes planus] (acquired), right foot: Secondary | ICD-10-CM | POA: Diagnosis not present

## 2023-09-30 DIAGNOSIS — M2142 Flat foot [pes planus] (acquired), left foot: Secondary | ICD-10-CM | POA: Diagnosis not present

## 2023-09-30 DIAGNOSIS — E1151 Type 2 diabetes mellitus with diabetic peripheral angiopathy without gangrene: Secondary | ICD-10-CM | POA: Diagnosis not present

## 2023-09-30 DIAGNOSIS — L84 Corns and callosities: Secondary | ICD-10-CM

## 2023-09-30 NOTE — Progress Notes (Signed)

## 2023-10-04 ENCOUNTER — Other Ambulatory Visit: Payer: Self-pay | Admitting: Family Medicine

## 2023-10-05 ENCOUNTER — Ambulatory Visit: Payer: Medicare Other | Admitting: Podiatry

## 2023-10-07 ENCOUNTER — Encounter: Payer: Self-pay | Admitting: Family Medicine

## 2023-10-07 NOTE — Telephone Encounter (Signed)
 Nurses Certainly I can understand why he is concerned   I am willing to see him in a same-day slot somewhere in the next 2 weeks thank you  Or he can see Toni Amend or Dr. Adriana Simas sooner if they have an opening thank you

## 2023-10-08 ENCOUNTER — Encounter: Payer: Self-pay | Admitting: Family Medicine

## 2023-10-08 ENCOUNTER — Ambulatory Visit: Admitting: Family Medicine

## 2023-10-08 VITALS — BP 120/70 | HR 67 | Temp 97.7°F | Ht 72.0 in

## 2023-10-08 DIAGNOSIS — I10 Essential (primary) hypertension: Secondary | ICD-10-CM

## 2023-10-08 DIAGNOSIS — K4090 Unilateral inguinal hernia, without obstruction or gangrene, not specified as recurrent: Secondary | ICD-10-CM | POA: Diagnosis not present

## 2023-10-08 NOTE — Assessment & Plan Note (Signed)
 Hernia noted on exam.  Referring to general surgery.  Advised to avoid heavy lifting.  Supportive care.

## 2023-10-08 NOTE — Progress Notes (Signed)
 Subjective:  Patient ID: Jerry Burns, male    DOB: October 14, 1933  Age: 89 y.o. MRN: [id number]  CC:   Chief Complaint  Patient presents with   Mass    In groin area x's 1 week. Not painful. Swells and retracts    HPI:  89 year old male presents for evaluation of the above.  Patient reports that he has noticed a bulging area in the left groin for the past week.  No significant pain.  Seems to come and go.  Patient does do a lot of lifting.  No no relieving factors.  No other associated symptoms.  No other complaints.  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 pylori infection 12/19/2019   Ascites 12/19/2019   Chronic cough 10/26/2018   Pleural effusion, bilateral  L > R first detected 09/02/2018 in setting of dx of dvt 09/14/2018   DOE (dyspnea on exertion) 09/13/2018   Acute diastolic CHF (congestive heart failure) (HCC) 09/03/2018   Peripheral edema    Normochromic normocytic anemia 04/11/2018   Primary open angle glaucoma of both eyes, mild stage 08/02/2017   Long term current use of antithrombotics/antiplatelets 02/06/2016   History of colonic polyps 11/21/2014   Coronary artery disease involving native coronary artery of native heart with other form of angina pectoris (HCC) 09/03/2014   Rotator cuff syndrome of right shoulder 12/16/2011   Hyperlipidemia associated with type 2 diabetes mellitus (HCC) 12/23/2007   BPH (benign prostatic hyperplasia) 01/26/2007   Hyperlipidemia 08/04/2006   Essential hypertension 08/04/2006   Osteoarthritis 08/04/2006    Social Hx   Social  History   Socioeconomic History   Marital status: Married    Spouse name: Not on file   Number of children: Not on file   Years of education: 14   Highest education level: Some college, no degree  Occupational History   Occupation: retired  Tobacco Use   Smoking status: Former    Current packs/day: 0.00    Average packs/day: 1 pack/day for 52.0 years (52.0 ttl pk-yrs)    Types: Cigarettes, Cigars    Start date: 08/10/1955    Quit date: 07/28/2007    Years since quitting: 16.2    Passive exposure: Never   Smokeless tobacco: Never  Vaping Use   Vaping status: Never Used  Substance and Sexual Activity   Alcohol use: Yes    Comment: Daily about 1 glass of wine and gin   Drug use: No   Sexual activity: Not Currently  Other Topics Concern   Not on file  Social History Narrative   Not on file   Social Drivers of Health   Financial Resource Strain: Low Risk  (06/11/2023)   Overall Financial Resource Strain (CARDIA)    Difficulty of Paying Living Expenses: Not very hard  Food Insecurity: No Food Insecurity (10/07/2023)   Hunger Vital Sign    Worried About Running Out of Food in the Last Year: Never true    Ran Out of Food in the Last Year: Never true  Transportation Needs: Unmet Transportation Needs (10/07/2023)   PRAPARE - Transportation  Lack of Transportation (Medical): Yes    Lack of Transportation (Non-Medical): No  Physical Activity: Insufficiently Active (10/07/2023)   Exercise Vital Sign    Days of Exercise per Week: 3 days    Minutes of Exercise per Session: 20 min  Stress: No Stress Concern Present (10/07/2023)   [employer] of Occupational Health - Occupational Stress Questionnaire    Feeling of Stress : Only a little  Social Connections: Moderately Isolated (10/07/2023)   Social Connection and Isolation Panel [NHANES]    Frequency of Communication with Friends and Family: More than three times a week    Frequency of Social Gatherings with Friends and Family:  Once a week    Attends Religious Services: Never    Database administrator or Organizations: No    Attends Engineer, structural: Not on file    Marital Status: Married    Review of Systems Per HPI  Objective:  BP 120/70   Pulse 67   Temp 97.7 F (36.5 C)   Ht 6' (1.829 m)   SpO2 99%   BMI 28.45 kg/m      10/08/2023    9:38 AM 07/07/2023   10:00 AM 06/14/2023   10:07 AM  BP/Weight  Systolic BP 120 127 121  Diastolic BP 70 65 67  Wt. (Lbs)  209.8 208.4  BMI  28.45 kg/m2 28.26 kg/m2    Physical Exam Vitals and nursing note reviewed.  Constitutional:      General: He is not in acute distress.    Appearance: Normal appearance.  HENT:     Head: Normocephalic and atraumatic.  Pulmonary:     Effort: Pulmonary effort is normal. No respiratory distress.  Genitourinary:    Comments: Left inguinal hernia noted on exam. Neurological:     Mental Status: He is alert.     Lab Results  Component Value Date   WBC 6.0 06/30/2023   HGB 12.9 (L) 06/30/2023   HCT 39.2 06/30/2023   PLT 137 (L) 06/30/2023   GLUCOSE 220 (H) 07/01/2023   CHOL 116 07/01/2023   TRIG 108 07/01/2023   HDL 47 07/01/2023   LDLCALC 49 07/01/2023   ALT 21 07/01/2023   AST 29 07/01/2023   NA 140 07/01/2023   K 4.2 07/01/2023   CL 103 07/01/2023   CREATININE 1.37 (H) 07/01/2023   BUN 62 (H) 07/01/2023   CO2 20 07/01/2023   TSH 4.149 11/30/2019   PSA 0.13 12/21/2012   INR 1.1 07/01/2022   HGBA1C 7.2 (H) 07/01/2023   MICROALBUR 2.21 (H) 10/26/2013     Assessment & Plan:  Left inguinal hernia Assessment & Plan: Hernia noted on exam.  Referring to general surgery.  Advised to avoid heavy lifting.  Supportive care.  Orders: -     Ambulatory referral to General Surgery  Essential hypertension Assessment & Plan: BP stable here today.  Continue current medications. Recent labs reviewed.    Everlene Other DO [facility]

## 2023-10-08 NOTE — Assessment & Plan Note (Signed)
 BP stable here today.  Continue current medications. Recent labs reviewed.

## 2023-10-11 ENCOUNTER — Encounter (HOSPITAL_COMMUNITY): Payer: Self-pay | Admitting: Hematology

## 2023-10-15 ENCOUNTER — Other Ambulatory Visit: Payer: Self-pay | Admitting: Family Medicine

## 2023-10-19 ENCOUNTER — Ambulatory Visit: Admitting: Podiatry

## 2023-10-19 NOTE — Progress Notes (Signed)
 Patient arrived today at 9:26 AM for his podiatry appointment.  His scheduled appointment time was 9:45 AM.  At 9:55 AM, the medical assistant did go to the waiting room to retrieve the patient.  However, upon calling for the patient, he stated that he has another appointment to get to and was just going to leave and not be seen.  Patient walked out of the office today without being examined.

## 2023-10-26 ENCOUNTER — Encounter: Payer: Self-pay | Admitting: General Surgery

## 2023-10-26 ENCOUNTER — Ambulatory Visit (INDEPENDENT_AMBULATORY_CARE_PROVIDER_SITE_OTHER): Admitting: General Surgery

## 2023-10-26 VITALS — BP 147/77 | HR 68 | Temp 97.4°F | Resp 12 | Ht 72.0 in | Wt 207.0 lb

## 2023-10-26 DIAGNOSIS — K4090 Unilateral inguinal hernia, without obstruction or gangrene, not specified as recurrent: Secondary | ICD-10-CM | POA: Diagnosis not present

## 2023-10-26 NOTE — Progress Notes (Signed)
 Jerry Burns; [id number]; 1934-02-20   HPI Patient is an 89 year old white male who was referred to my care by Lilyan Punt for evaluation and treatment of a left inguinal hernia.  Patient states he developed the hernia earlier this year after having an episode of constipation.  The lump is made worse with straining or doing heavy lifting.  It does resolve when lying down.  He denies any significant pain in the left groin region.  It is not affecting his daily lifestyle at the present time. Past Medical History:  Diagnosis Date   Anginal pain (HCC)    Aortic atherosclerosis (HCC) 10/06/2020   Seen on CAT scan 2021   Arthritis    Bilateral knee   Atrial fibrillation (HCC) 12/28/2021   On Eliquis   CHF (congestive heart failure) (HCC)    Coronary artery disease    Diabetes mellitus    insulin dependent    DVT, lower extremity, distal, acute (HCC) 09/09/2018   Hyperlipidemia    Hypertension    Progressive angina (HCC) 03/2015   a. s/p DES to proximal LAD and balloon angioplasty to distal LAD in 2016 b. patent stent by repeat cath in 01/2017 with mild residual CAD)   Pulmonary nodule 10/06/2020   Seen on CAT scan 2020 1 repeat CAT scan in 2022   Stroke [facility])     Past Surgical History:  Procedure Laterality Date   APPENDECTOMY     CARDIAC CATHETERIZATION N/A 04/12/2015   Procedure: Left Heart Cath and Coronary Angiography;  Surgeon: Tonny Bollman, MD; pLAD 75>0% w/ 3.0x12 mm Resolute DES, dLAD 80>0% w/ Angiosculpt scoring balloon, CFX luminal irreg, RCA mild dz, EF 55-65%    COLONOSCOPY  2011   RMR: 1. Normal rectum 2. Left sided diverticula , 2 ascending colon polyps, status post snare polypectomy. Remainder of colonic mucosa appeared unremarkable.    COLONOSCOPY N/A 05/13/2016   Procedure: COLONOSCOPY;  Surgeon: Corbin Ade, MD;  Location: [facility];  Service: Endoscopy;  Laterality: N/A;  7:30 AM   COLONOSCOPY N/A 05/18/2018   Rourk: Diverticulosis, single tubular adenoma  removed.  No plans for future screening or surveillance colonoscopy due to age.   CORONARY ANGIOGRAPHY N/A 02/04/2017   Procedure: CORONARY ANGIOGRAPHY (CATH LAB);  Surgeon: Lennette Bihari, MD;  Location: [facility] INVASIVE CV LAB;  Service: Cardiovascular;  Laterality: N/A;   CORONARY ANGIOPLASTY WITH STENT PLACEMENT  08/22/2014   PTCA/DES X mLAD with 2.5 x 16 mm Promus Premier DES by Dr Sanjuana Kava   ESOPHAGOGASTRODUODENOSCOPY N/A 05/18/2018   Rourk: Ulcerative reflux esophagitis, hiatal hernia, erosive gastropathy with H. pylori on biopsy, also in the setting of NSAID use.   FOOT SURGERY Right    LEFT HEART CATHETERIZATION WITH CORONARY ANGIOGRAM N/A 08/22/2014   Procedure: LEFT HEART CATHETERIZATION WITH CORONARY ANGIOGRAM;  Surgeon: Kathleene Hazel, MD;  Location: [facility] CATH LAB;  Service: Cardiovascular;  Laterality: N/A;   PERCUTANEOUS CORONARY STENT INTERVENTION (PCI-S)  08/22/2014   Procedure: PERCUTANEOUS CORONARY STENT INTERVENTION (PCI-S);  Surgeon: Kathleene Hazel, MD;    SHOULDER SURGERY     TONSILLECTOMY      Family History  Problem Relation Age of Onset   Diabetes Other    Diabetes Brother    Colon cancer Maternal Uncle    Skin cancer Mother    Diabetes Father    Hypertension Sister    Skin cancer Sister     Current Outpatient Medications on File Prior to Visit  Medication Sig Dispense Refill  Ascorbic Acid (VITAMIN C) 1000 MG tablet Take 1,000 mg by mouth daily.      atorvastatin (LIPITOR) 80 MG tablet TAKE (1) TABLET BY MOUTH ONCE DAILY. 90 tablet 0   augmented betamethasone dipropionate (DIPROLENE-AF) 0.05 % ointment Apply topically 2 (two) times daily as needed.     B-D UF III MINI PEN NEEDLES 31G X 5 MM MISC SMARTSIG:1 Each SUB-Q Twice Daily     BD PEN NEEDLE NANO 2ND GEN 32G X 4 MM MISC USE 1 PEN NEEDLE FIVE TIMES DAILY     carvedilol (COREG) 3.125 MG tablet Take 1 tablet (3.125 mg total) by mouth 2 (two) times daily with a meal. 60 tablet 3   COMBIGAN  0.2-0.5 % ophthalmic solution Place 1 drop into the left eye 2 (two) times daily.   2   Cyanocobalamin (B-12) 2500 MCG TABS Take 1 tablet by mouth 2 (two) times a week. Three times a week on Monday Wednesday and Friday     ELIQUIS 2.5 MG TABS tablet TAKE (1) TABLET BY MOUTH TWICE DAILY. 60 tablet 5   empagliflozin (JARDIANCE) 10 MG TABS tablet Take 1 tablet (10 mg total) by mouth daily before breakfast. 30 tablet 5   erythromycin ophthalmic ointment Place 1 application  into the left eye 3 (three) times daily as needed (infection).     Flaxseed, Linseed, (FLAXSEED OIL PO) Take 1 capsule by mouth daily.      glucose blood (CONTOUR NEXT TEST) test strip USE 1 STRIP TO CHECK GLUCOSE THREE TIMES DAILY 300 each 0   HUMALOG KWIKPEN 100 UNIT/ML KwikPen INJECT TWICE DAILY PER SLIDING SCALE FOLLOWING BREAKFAST AND DINNER: <85=0UNITS, 85-110=3 UNITS, 111-160 = 5, 161-220 = 7, >221 = 9 UNITS 15 mL 1   isosorbide mononitrate (IMDUR) 30 MG 24 hr tablet Take 1 tablet (30 mg total) by mouth daily. 90 tablet 3   ketoconazole (NIZORAL) 2 % cream Apply 1 Application topically 2 (two) times daily. 120 g 4   Lactobacillus (PROBIOTIC ACIDOPHILUS) CAPS Take 1 capsule by mouth daily at 6 (six) AM.     LANTUS SOLOSTAR 100 UNIT/ML Solostar Pen INJECT 24 TO 25 UNITS SUBCUTANEOUSLY ONCE DAILY AT 10AM. 15 mL 1   Multiple Vitamin (MULTIVITAMIN) tablet Take 1 tablet by mouth daily.     nitroGLYCERIN (NITROSTAT) 0.4 MG SL tablet PLACE 1 TAB UNDER TONGUE EVERY 5 MIN IF NEEDED FOR CHEST PAIN. MAY USE 3 TIMES.NO RELIEF CALL 911. 25 tablet 3   polyethylene glycol powder (MIRALAX) 17 GM/SCOOP powder Start by taking 1/2 capful mixed in 4 ounces of fluid daily. You can increase to 1 capful daily if needed to maintain easy BMs. 527 g 5   Polyvinyl Alcohol-Povidone (REFRESH OP) Place 1 drop into both ears 2 (two) times daily as needed (dryness).     tamsulosin (FLOMAX) 0.4 MG CAPS capsule Take 2 capsules po at bedtime. if this causes  drowsiness or dizziness to reduce it to just 1/day 180 capsule 1   torsemide (DEMADEX) 20 MG tablet TAKE 1 TO 2 TABLETS BY MOUTH EACH MORNING AS DIRECTED FOR SWELLING IN THE LEGS. 60 tablet 0   Turmeric 500 MG CAPS Take 500 mg by mouth daily.     VELTASSA 8.4 g packet Take 1 packet by mouth daily.     No current facility-administered medications on file prior to visit.    Allergies  Allergen Reactions   Adhesive [Tape]     bandaids cause blisters after leaving on for 24  hours    Sulfa Antibiotics Other (See Comments)    "was terrible" "eye went crazy"   Latex Other (See Comments)    blisters    Social History   Substance and Sexual Activity  Alcohol Use Yes   Comment: Daily about 1 glass of wine and gin    Social History   Tobacco Use  Smoking Status Former   Current packs/day: 0.00   Average packs/day: 1 pack/day for 52.0 years (52.0 ttl pk-yrs)   Types: Cigarettes, Cigars   Start date: 08/10/1955   Quit date: 07/28/2007   Years since quitting: 16.2   Passive exposure: Never  Smokeless Tobacco Never    Review of Systems  Constitutional:  Positive for malaise/fatigue.  HENT:  Positive for sinus pain and sore throat.   Eyes: Negative.   Respiratory: Negative.    Cardiovascular: Negative.   Gastrointestinal: Negative.   Genitourinary: Negative.   Musculoskeletal: Negative.   Skin:  Positive for rash.  Neurological:  Positive for dizziness.  Endo/Heme/Allergies: Negative.   Psychiatric/Behavioral: Negative.      Objective   Vitals:   10/26/23 1527  BP: (!) 147/77  Pulse: 68  Resp: 12  Temp: (!) 97.4 F (36.3 C)  SpO2: 98%    Physical Exam Vitals reviewed.  Constitutional:      Appearance: Normal appearance. He is normal weight. He is not ill-appearing.  HENT:     Head: Normocephalic and atraumatic.  Cardiovascular:     Rate and Rhythm: Normal rate and regular rhythm.     Heart sounds: Normal heart sounds. No murmur heard.    No friction rub. No  gallop.  Pulmonary:     Effort: Pulmonary effort is normal. No respiratory distress.     Breath sounds: Normal breath sounds. No stridor. No wheezing, rhonchi or rales.  Abdominal:     General: Bowel sounds are normal. There is no distension.     Palpations: Abdomen is soft. There is no mass.     Tenderness: There is no abdominal tenderness. There is no guarding or rebound.     Hernia: A hernia is present.     Comments: And easily reducible left inguinal hernias noted.  No Reigle hernia present.  Genitourinary:    Testes: Normal.  Skin:    General: Skin is warm and dry.  Neurological:     Mental Status: He is alert and oriented to person, place, and time.   Primary care notes reviewed  Assessment  Left inguinal hernia, currently asymptomatic Plan  I told the patient that he did not need surgical repair of the hernia unless he becomes more symptomatic and is affecting his daily lifestyle.  He is pleased to hear that.  He was told to try to limit any significant heavy lifting or activity that could aggravate the hernia.  The risk of incarceration and how to treat it was explained to the patient.  He will return to my care should to become more symptomatic.  Follow-up here as needed.

## 2023-11-03 DIAGNOSIS — N184 Chronic kidney disease, stage 4 (severe): Secondary | ICD-10-CM | POA: Diagnosis not present

## 2023-11-03 DIAGNOSIS — N1832 Chronic kidney disease, stage 3b: Secondary | ICD-10-CM | POA: Diagnosis not present

## 2023-11-03 DIAGNOSIS — Z794 Long term (current) use of insulin: Secondary | ICD-10-CM | POA: Diagnosis not present

## 2023-11-03 DIAGNOSIS — E785 Hyperlipidemia, unspecified: Secondary | ICD-10-CM | POA: Diagnosis not present

## 2023-11-03 DIAGNOSIS — E1169 Type 2 diabetes mellitus with other specified complication: Secondary | ICD-10-CM | POA: Diagnosis not present

## 2023-11-03 DIAGNOSIS — E1122 Type 2 diabetes mellitus with diabetic chronic kidney disease: Secondary | ICD-10-CM | POA: Diagnosis not present

## 2023-11-03 DIAGNOSIS — I10 Essential (primary) hypertension: Secondary | ICD-10-CM | POA: Diagnosis not present

## 2023-11-05 LAB — BASIC METABOLIC PANEL WITH GFR
BUN/Creatinine Ratio: 29 — ABNORMAL HIGH (ref 10–24)
BUN: 42 mg/dL — ABNORMAL HIGH (ref 8–27)
CO2: 23 mmol/L (ref 20–29)
Calcium: 9.8 mg/dL (ref 8.6–10.2)
Chloride: 104 mmol/L (ref 96–106)
Creatinine, Ser: 1.46 mg/dL — ABNORMAL HIGH (ref 0.76–1.27)
Glucose: 196 mg/dL — ABNORMAL HIGH (ref 70–99)
Potassium: 4.6 mmol/L (ref 3.5–5.2)
Sodium: 142 mmol/L (ref 134–144)
eGFR: 46 mL/min/{1.73_m2} — ABNORMAL LOW (ref >59)

## 2023-11-05 LAB — MICROALBUMIN / CREATININE URINE RATIO
Creatinine, Urine: 18.3 mg/dL
Microalb/Creat Ratio: 314 mg/g{creat} — ABNORMAL HIGH (ref 0–29)
Microalbumin, Urine: 57.4 ug/mL

## 2023-11-05 LAB — LIPID PANEL
Chol/HDL Ratio: 2.6 ratio (ref 0.0–5.0)
Cholesterol, Total: 117 mg/dL (ref 100–199)
HDL: 45 mg/dL (ref >39)
LDL Chol Calc (NIH): 50 mg/dL (ref 0–99)
Triglycerides: 121 mg/dL (ref 0–149)
VLDL Cholesterol Cal: 22 mg/dL (ref 5–40)

## 2023-11-05 LAB — HEMOGLOBIN A1C
Est. average glucose Bld gHb Est-mCnc: 160 mg/dL
Hgb A1c MFr Bld: 7.2 % — ABNORMAL HIGH (ref 4.8–5.6)

## 2023-11-06 ENCOUNTER — Encounter: Payer: Self-pay | Admitting: Family Medicine

## 2023-11-07 ENCOUNTER — Other Ambulatory Visit: Payer: Self-pay | Admitting: Family Medicine

## 2023-11-08 ENCOUNTER — Other Ambulatory Visit: Payer: Self-pay

## 2023-11-08 MED ORDER — HUMALOG KWIKPEN 100 UNIT/ML ~~LOC~~ SOPN: PEN_INJECTOR | SUBCUTANEOUS | 1 refills | Status: DC

## 2023-11-11 ENCOUNTER — Other Ambulatory Visit: Payer: Self-pay | Admitting: Family Medicine

## 2023-11-12 ENCOUNTER — Ambulatory Visit (INDEPENDENT_AMBULATORY_CARE_PROVIDER_SITE_OTHER): Payer: Medicare Other | Admitting: Family Medicine

## 2023-11-12 ENCOUNTER — Other Ambulatory Visit: Payer: Self-pay

## 2023-11-12 VITALS — BP 122/68 | HR 63 | Temp 97.5°F | Ht 72.0 in | Wt 207.0 lb

## 2023-11-12 DIAGNOSIS — I10 Essential (primary) hypertension: Secondary | ICD-10-CM | POA: Diagnosis not present

## 2023-11-12 DIAGNOSIS — K760 Fatty (change of) liver, not elsewhere classified: Secondary | ICD-10-CM | POA: Diagnosis not present

## 2023-11-12 DIAGNOSIS — Z79899 Other long term (current) drug therapy: Secondary | ICD-10-CM | POA: Diagnosis not present

## 2023-11-12 DIAGNOSIS — N1832 Chronic kidney disease, stage 3b: Secondary | ICD-10-CM | POA: Diagnosis not present

## 2023-11-12 DIAGNOSIS — E785 Hyperlipidemia, unspecified: Secondary | ICD-10-CM

## 2023-11-12 DIAGNOSIS — R600 Localized edema: Secondary | ICD-10-CM

## 2023-11-12 DIAGNOSIS — I951 Orthostatic hypotension: Secondary | ICD-10-CM | POA: Diagnosis not present

## 2023-11-12 DIAGNOSIS — N1831 Chronic kidney disease, stage 3a: Secondary | ICD-10-CM | POA: Diagnosis not present

## 2023-11-12 DIAGNOSIS — E1122 Type 2 diabetes mellitus with diabetic chronic kidney disease: Secondary | ICD-10-CM

## 2023-11-12 DIAGNOSIS — Z794 Long term (current) use of insulin: Secondary | ICD-10-CM

## 2023-11-12 DIAGNOSIS — E1169 Type 2 diabetes mellitus with other specified complication: Secondary | ICD-10-CM | POA: Diagnosis not present

## 2023-11-12 NOTE — Progress Notes (Signed)
 Subjective:    Patient ID: Jerry Burns, male    DOB: 1933-10-04, 89 y.o.   MRN: [id number]  HPI Follow up for chronic conditions  Diabetes Mellitus  Orthostatic Hypertension   Edema  Discussed the use of AI scribe software for clinical note transcription with the patient, who gave verbal consent to proceed.  History of Present Illness   Jerry Burns is an 89 year old male with type 2 diabetes mellitus who presents for routine follow-up.  His diabetes management is stable with an A1c of 7.2%. He experiences no episodes of hypoglycemia and maintains a regular eating schedule, with occasional dietary indiscretions. He is on Jardiance  and Veltassa, and takes torsemide  four times a week. He continues to see a nephrologist approximately every six months.  He experiences dizziness associated with isosorbide , which he takes at half a tablet. He experiences occasional heart vibrations, likening them to a phone vibrating, but does not report any associated symptoms. No recent accidents, injuries, or falls, and he is careful with his activities.  His hernia is manageable, and he can push it back in most of the time, especially at night. He has resumed weight exercises.  He is dissatisfied with his current podiatrist, who primarily trims his toenails without addressing his painful calluses. He uses salicylic acid for his feet, despite it being contraindicated for diabetics, as it is the only treatment that provides relief.  He has a history of proteinuria, which has fluctuated over the years. He reports stable kidney function and good blood pressure control.  He mentions a past visit to a liver specialist but has not followed up. He is aware of fatty liver changes but notes that his liver enzymes are currently normal.        Review of Systems     Objective:   Physical Exam General-in no acute distress Eyes-no discharge Lungs-respiratory rate normal, CTA CV-no  murmurs,RRR Extremities skin warm dry moderate lower leg edema bilateral edema Neuro grossly normal Behavior normal, alert        Assessment & Plan:  1. Essential hypertension (Primary) Blood pressure doing well continue current measures - Basic metabolic panel with GFR  2. Hyperlipidemia associated with type 2 diabetes mellitus (HCC) Cholesterol profile looks great continue medication healthy diet  3. Type 2 diabetes mellitus with stage 3b chronic kidney disease, with long-term current use of insulin  (HCC) A1c where we would like for it to be follow this up again in 6 months time kidney function is stable.  Follow through with kidney specialist as well - Microalbumin / creatinine urine ratio - Hemoglobin A1c  4. High risk medication use Standard labs before next visit - Hepatic function panel - Basic metabolic panel with GFR  5. Type 2 diabetes mellitus with stage 3a chronic kidney disease, with long-term current use of insulin  (HCC) The patient was seen today as part of a comprehensive visit for diabetes. The importance of keeping her A1c at or below 7 range was discussed.  Discussed diet, activity, and medication compliance Emphasized healthy eating primarily with vegetables fruits and if utilizing meats lean meats such as chicken or fish grilled baked broiled Avoid sugary drinks Minimize and avoid processed foods Fit in regular physical activity preferably 25 to 30 minutes 4 times per week Standard follow-up visit recommended.  Patient aware lack of control and follow-up increases risk of diabetic complications. Regular follow-up visits Yearly ophthalmology Yearly foot exam A1c where we would like it to be continue current measures -  Microalbumin / creatinine urine ratio - Hemoglobin A1c  6. Fatty liver Check liver function.  Patient does not want to go back to GI unless necessary - Hepatic function panel  7. Pedal edema He was taking his Demadex  4 days a week I  recommend that he go ahead and start taking it every single day  8. Orthostatic hypotension I checked his blood pressure sitting and standing there is no appreciable change we will keep everything as is  Follow-up in approximately 5 months

## 2023-11-15 ENCOUNTER — Other Ambulatory Visit: Payer: Self-pay | Admitting: Family Medicine

## 2023-11-29 ENCOUNTER — Ambulatory Visit: Admitting: Podiatry

## 2023-12-02 ENCOUNTER — Other Ambulatory Visit: Payer: Self-pay | Admitting: Cardiology

## 2023-12-06 ENCOUNTER — Other Ambulatory Visit: Payer: Self-pay | Admitting: Student

## 2023-12-07 ENCOUNTER — Ambulatory Visit (INDEPENDENT_AMBULATORY_CARE_PROVIDER_SITE_OTHER): Admitting: Podiatry

## 2023-12-07 DIAGNOSIS — M7752 Other enthesopathy of left foot: Secondary | ICD-10-CM | POA: Diagnosis not present

## 2023-12-07 DIAGNOSIS — L603 Nail dystrophy: Secondary | ICD-10-CM

## 2023-12-07 DIAGNOSIS — E1051 Type 1 diabetes mellitus with diabetic peripheral angiopathy without gangrene: Secondary | ICD-10-CM

## 2023-12-07 DIAGNOSIS — M7751 Other enthesopathy of right foot: Secondary | ICD-10-CM

## 2023-12-07 DIAGNOSIS — I739 Peripheral vascular disease, unspecified: Secondary | ICD-10-CM | POA: Diagnosis not present

## 2023-12-07 NOTE — Progress Notes (Signed)
 Patient presents for evaluation and treatment of tenderness around nails feet and hyperkeratotic lesions.  Patient is diabetic.  Has not noticed any signs of ulcerations or infections.  Physical exam:  General appearance: Alert, pleasant, and in no acute distress  Vascular: Pedal pulses: DP diminished bilaterally, PT nonpalpable bilaterally.  Moderate edema lower legs bilaterally  Neurological:  Paresthesias noted bilaterally.  Dermatologic:  Nails thickened and dystrophic 1-5 BL. Skin thinned.  Decreased hair lower limbs BL.  Skin hyperpigmentation lower legs BL.  Musculoskeletal:  Tenderness plantar aspect fifth MTP bilaterally.  Tenderness with range of motion fifth MTP bilaterally   Diagnosis: Bursitis feet bilaterally at plantar fifth MTP Dystrophic nails 1-5 BL. PAD/PVD  Q8 Diabetes mellitus type 1.  Plan: -Debrided dystrophic thickened nails 1 through 5 bilaterally. -injected 1.0cc mixture of 0.5cc 0.5% Marcaine and  0.5cc Dexamethasone sodium phosphate USP at [place] sub fifth MTP right. -Injected 1.0cc mixture of 0.5cc 0.5% Marcaine and  0.5cc Dexamethasone sodium phosphate USP at [place] sub fifth MTP left.   Return 3 months

## 2023-12-15 ENCOUNTER — Encounter: Payer: Self-pay | Admitting: Family Medicine

## 2023-12-15 ENCOUNTER — Other Ambulatory Visit: Payer: Self-pay | Admitting: Family Medicine

## 2023-12-15 ENCOUNTER — Ambulatory Visit (INDEPENDENT_AMBULATORY_CARE_PROVIDER_SITE_OTHER): Admitting: Family Medicine

## 2023-12-15 VITALS — BP 115/72 | HR 70 | Temp 97.2°F | Ht 72.0 in | Wt 207.0 lb

## 2023-12-15 DIAGNOSIS — N5089 Other specified disorders of the male genital organs: Secondary | ICD-10-CM | POA: Diagnosis not present

## 2023-12-15 NOTE — Progress Notes (Signed)
   Subjective:    Patient ID: Jerry Burns, male    DOB: [birth year], 89 y.o.   MRN: [id number]  HPI  Nodule changes from hard to soft located on right , inside of the testicle  He states he has an area on the right testicle backside that sometimes soft sometimes formed sometimes hard giving him a lot of discomfort at times denies any other trouble denies any urinary issues. Review of Systems     Objective:   Physical Exam  Abdomen is soft lungs clear heart regular testicular exam small testicle on the left side from previous months when he was a child right side has a nodule but it is soft      Assessment & Plan:   Testicular nodule Ultrasound ordered Doubt cancer but we will see what ultrasound s keep all regular follow-up visits for later this year hows

## 2023-12-23 ENCOUNTER — Other Ambulatory Visit: Payer: Self-pay | Admitting: Family Medicine

## 2023-12-23 DIAGNOSIS — N1832 Chronic kidney disease, stage 3b: Secondary | ICD-10-CM | POA: Diagnosis not present

## 2023-12-28 ENCOUNTER — Ambulatory Visit: Payer: Medicare Other | Admitting: Family Medicine

## 2023-12-28 DIAGNOSIS — N400 Enlarged prostate without lower urinary tract symptoms: Secondary | ICD-10-CM | POA: Diagnosis not present

## 2023-12-28 DIAGNOSIS — N1832 Chronic kidney disease, stage 3b: Secondary | ICD-10-CM | POA: Diagnosis not present

## 2023-12-28 DIAGNOSIS — E875 Hyperkalemia: Secondary | ICD-10-CM | POA: Diagnosis not present

## 2023-12-28 DIAGNOSIS — E1122 Type 2 diabetes mellitus with diabetic chronic kidney disease: Secondary | ICD-10-CM | POA: Diagnosis not present

## 2023-12-28 DIAGNOSIS — I129 Hypertensive chronic kidney disease with stage 1 through stage 4 chronic kidney disease, or unspecified chronic kidney disease: Secondary | ICD-10-CM | POA: Diagnosis not present

## 2023-12-29 ENCOUNTER — Other Ambulatory Visit: Payer: Self-pay | Admitting: *Deleted

## 2023-12-29 DIAGNOSIS — N5089 Other specified disorders of the male genital organs: Secondary | ICD-10-CM

## 2024-01-02 ENCOUNTER — Other Ambulatory Visit: Payer: Self-pay | Admitting: Cardiology

## 2024-01-02 DIAGNOSIS — I4891 Unspecified atrial fibrillation: Secondary | ICD-10-CM

## 2024-01-03 NOTE — Telephone Encounter (Signed)
 Prescription refill request for Eliquis  received. Indication: AF Last office visit: 04/09/23  Letta Raw MD Scr: 1.46 on 11/03/23  Age: 89 Weight: 95.7kg  Continue Eliquis  2.5mg  twice daily due to age and H/O elevated Scr.  Refill approved.

## 2024-01-04 ENCOUNTER — Ambulatory Visit (HOSPITAL_COMMUNITY)
Admission: RE | Admit: 2024-01-04 | Discharge: 2024-01-04 | Disposition: A | Discharge status: Home / Self Care | Source: Ambulatory Visit | Attending: Family Medicine | Admitting: Family Medicine

## 2024-01-04 DIAGNOSIS — N433 Hydrocele, unspecified: Secondary | ICD-10-CM | POA: Diagnosis not present

## 2024-01-04 DIAGNOSIS — N5089 Other specified disorders of the male genital organs: Secondary | ICD-10-CM | POA: Insufficient documentation

## 2024-01-10 ENCOUNTER — Ambulatory Visit: Payer: Self-pay | Admitting: Family Medicine

## 2024-01-18 ENCOUNTER — Other Ambulatory Visit: Payer: Self-pay

## 2024-01-18 MED ORDER — TORSEMIDE 20 MG PO TABS: ORAL_TABLET | ORAL | 0 refills | Status: DC

## 2024-02-11 ENCOUNTER — Telehealth: Payer: Self-pay | Admitting: Nurse Practitioner

## 2024-02-11 MED ORDER — LANTUS SOLOSTAR 100 UNIT/ML ~~LOC~~ SOPN: PEN_INJECTOR | SUBCUTANEOUS | 2 refills | Status: DC

## 2024-02-11 NOTE — Telephone Encounter (Signed)
 Refill on  LANTUS  SOLOSTAR 100 UNIT/ML Solostar Pen   [facility]

## 2024-02-17 NOTE — Progress Notes (Unsigned)
 Cardiology Office Note    Date:  02/18/2024  ID:  ULYSEE FYOCK, DOB 03-23-1934, MRN [id number] Cardiologist: Alvan Carrier, MD Cardiology APP:  Johnson Laymon HERO, PA-C { : History of Present Illness:    Jerry Burns is a 89 y.o. male with past medical history of CAD (s/p DES to proximal LAD and balloon angioplasty to distal LAD in 2016, patent stent by repeat cath in 01/2017 with mild residual CAD, NST in 05/2022 showing mild peri-infarct ischemia and medical management pursued), paroxysmal atrial fibrillation, chronic HFpEF, HTN, HLD, IDDM, Stage 3 CKD and prior CVA who presents to the office today for 5-month follow-up.  He was examined by Dr. Alvan in 03/2023 and denied any recent chest pain or shortness of breath at that time. Was taking Torsemide  approximately 4 days a week due to orthostatic dizziness at times. He did have some lower extremity edema but this was felt to be due to venous insufficiency. No changes were made to his cardiac medications and he was continued on Eliquis  2.5 mg twice daily, Atorvastatin  80mg  daily, Coreg  3.125 mg twice daily, Jardiance  25 mg daily, Imdur  30 mg daily and Torsemide  20 mg as needed.  In talking with the patient today, he reports overall doing well since his last office visit. He remains active in doing chores around his home and is also the primary caregiver for his wife. 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 He does try to limit his activity when the temperatures are warm. Says that he consumes at least 6 to 8 glasse of water  a day along with occasional juice. Denies any recent exertional chest pain or dyspnea on exertion.  Notes occasional palpitations but very rare. No specific orthopnea or PND. He does have pitting edema on examination today and says he has only been taking Torsemide  10 mg most days.  Studies Reviewed:   EKG: EKG is ordered today and  demonstrates:   EKG Interpretation Date/Time:  Friday February 18 2024 13:21:21 EDT Ventricular Rate:  73 PR Interval:    QRS Duration:  116 QT Interval:  444 QTC Calculation: 489 R Axis:   -52  Text Interpretation: Rate-controlled atrial fibrillation Occasional PVC's No acute ST changes. Confirmed by Johnson Laymon ([id number]) on 02/18/2024 1:27:41 PM       Echocardiogram: 12/2020 IMPRESSIONS     1. Left ventricular ejection fraction, by estimation, is 55 to 60%. The  left ventricle has normal function. The left ventricle has no regional  wall motion abnormalities. Left ventricular diastolic parameters are  indeterminate in the setting of apparent  atrial fibrillation.   2. Right ventricular systolic function is normal. The right ventricular  size is normal. Tricuspid regurgitation signal is inadequate for assessing  PA pressure.   3. Left atrial size was moderately dilated.   4. The mitral valve is grossly normal. Mild mitral valve regurgitation.   5. The aortic valve is tricuspid. Aortic valve regurgitation is not  visualized.   6. The inferior vena cava is dilated in size with >50% respiratory  variability, suggesting right atrial pressure of 8 mmHg.   7. Pericardial effusion has resolved.   Event Monitor: 11/2021 14 day monitor Afib throughout the study with min HR 46, Max HR 119, Avg HR 73 Frequent ventricular ectopy as PVCs (8.9%). Rare couplets and triplets. Two runs of NSVT longest 4 beats No symptoms reported     Patch Wear Time:  13 days and 16 hours (2023-04-24T14:06:58-0400 to 2023-05-08T06:35:47-0400)   2 Ventricular Tachycardia runs occurred, the run with the fastest interval lasting 4 beats with a max rate of 193 bpm, the longest lasting 4 beats with an avg rate of 125 bpm. Atrial Flutter occurred continuously (100% burden), ranging from 46-119 bpm  (avg of 73 bpm). Isolated VEs were frequent (8.9%, [id number]), VE Couplets were rare (<1.0%, 3002), and VE Triplets  were rare (<1.0%, 275). Ventricular Bigeminy and Trigeminy were present.   NST: 05/2022   Findings are consistent with prior inferior myocardial infarction with mild peri-infarct ischemia.   No ST deviation was noted.   LV perfusion is abnormal. There is a moderate size moderate intensity inferior/inferoseptal basal to apical defect with mild reversibility.   Left ventricular function is abnormal. Nuclear stress EF: 48 %. The left ventricular ejection fraction is mildly decreased (45-54%). End diastolic cavity size is normal.   Low to intermediate risk study based on combination of mild peri-infarct ischemia and mild LV dysfunction.  Risk Assessment/Calculations:    CHA2DS2-VASc Score = 8  This indicates a 10.8% annual risk of stroke. The patient's score is based upon: CHF History: 1 HTN History: 1 Diabetes History: 1 Stroke History: 2 Vascular Disease History: 1 Age Score: 2 Gender Score: 0    Physical Exam:   VS:  BP 120/66 (BP Location: Right Arm, Cuff Size: Normal)   Pulse 71   Ht 6' (1.829 m)   Wt 206 lb (93.4 kg)   SpO2 97%   BMI 27.94 kg/m    Wt Readings from Last 3 Encounters:  02/18/24 206 lb (93.4 kg)  12/15/23 207 lb (93.9 kg)  11/12/23 207 lb (93.9 kg)     GEN: Pleasant, elderly male appearing in no acute distress NECK: No JVD; No carotid bruits CARDIAC: Irregularly irregular,  no murmurs, rubs, gallops RESPIRATORY:  Clear to auscultation without rales, wheezing or rhonchi  ABDOMEN: Appears non-distended. No obvious abdominal masses. EXTREMITIES: No clubbing or cyanosis. 1+ pitting edema up to mid-shins bilaterally.  Distal pedal pulses are 2+ bilaterally.   Assessment and Plan:   1. Coronary artery disease involving native coronary artery of native heart with other form of angina pectoris [facility]) - He previously underwent DES placement to the proximal-LAD and balloon angioplasty to the distal-LAD in 2016 with patent stent by cath in 2018. NST in 05/2022  showed mild peri-infarct ischemia and medical management was recommended. - He remains active at baseline for his age and denies any recent chest pain or dyspnea on exertion. He is listed as taking Imdur  30 mg daily but does take 15 mg daily as he reports dizziness with higher doses. Continue Imdur  15 mg daily along with Atorvastatin  80 mg daily and Coreg  3.125 mg twice daily. He is not on ASA given the need for anticoagulation.  2. Chronic heart failure with preserved ejection fraction (HCC) - Echocardiogram in 2022 showed a preserved EF of 55 to 60% with normal RV function. He denies any recent respiratory issues except for in the warmer temperatures. He does have pitting edema on examination today and he is listed as taking either Torsemide  20 mg daily or 40 mg daily but is actually taking 10 mg daily.  Encouraged him to at least take 20 mg given his pitting edema. If he develops worsening orthostasis, could reduce to 20 mg every other day. He is also on Jardiance  10 mg daily as well.   3. Paroxysmal atrial fibrillation (HCC) - Heart  rate is well-controlled in the 70's during today's visit. Continue Coreg  3.125 mg twice daily for rate-control. - He is on Eliquis  2.5 mg twice daily for anticoagulation. No reports of melena, hematochezia or hematuria. Has been continued on this dose per the anticoagulation clinic since creatinine was borderline at 1.46 when checked in 10/2023.  4. Essential hypertension - BP is well-controlled at 120/66 during today's visit. Continue current medical therapy with Coreg  3.125 mg twice daily and Imdur  15 mg daily.  5. Hyperlipidemia LDL goal <70 - FLP in 10/2023 showed total cholesterol at 117 and LDL at 50. Continue current medical therapy with Atorvastatin  80 mg daily.  6. Stage 3b chronic kidney disease (HCC) - Baseline creatinine ~ 1.5. Stable at 1.46 when checked in 10/2023.  Signed, Laymon CHRISTELLA Qua, PA-C

## 2024-02-18 ENCOUNTER — Encounter: Payer: Self-pay | Admitting: Student

## 2024-02-18 ENCOUNTER — Ambulatory Visit: Attending: Student | Admitting: Student

## 2024-02-18 VITALS — BP 120/66 | HR 71 | Ht 72.0 in | Wt 206.0 lb

## 2024-02-18 DIAGNOSIS — I5032 Chronic diastolic (congestive) heart failure: Secondary | ICD-10-CM | POA: Diagnosis not present

## 2024-02-18 DIAGNOSIS — I480 Paroxysmal atrial fibrillation: Secondary | ICD-10-CM | POA: Insufficient documentation

## 2024-02-18 DIAGNOSIS — I10 Essential (primary) hypertension: Secondary | ICD-10-CM | POA: Insufficient documentation

## 2024-02-18 DIAGNOSIS — N1832 Chronic kidney disease, stage 3b: Secondary | ICD-10-CM | POA: Diagnosis not present

## 2024-02-18 DIAGNOSIS — I25118 Atherosclerotic heart disease of native coronary artery with other forms of angina pectoris: Secondary | ICD-10-CM | POA: Insufficient documentation

## 2024-02-18 DIAGNOSIS — E785 Hyperlipidemia, unspecified: Secondary | ICD-10-CM | POA: Diagnosis not present

## 2024-02-18 MED ORDER — TORSEMIDE 20 MG PO TABS: 20.0000 mg | ORAL_TABLET | Freq: Every day | ORAL | 5 refills | Status: DC

## 2024-02-18 NOTE — Patient Instructions (Signed)
 Medication Instructions:   Would recommend taking Torsemide  20mg  DAILY.   *If you need a refill on your cardiac medications before your next appointment, please call your pharmacy*   Follow-Up: At [facility], you and your health needs are our priority.  As part of our continuing mission to provide you with exceptional heart care, our providers are all part of one team.  This team includes your primary Cardiologist (physician) and Advanced Practice Providers or APPs (Physician Assistants and Nurse Practitioners) who all work together to provide you with the care you need, when you need it.  Your next appointment:   6 month(s)  Provider:   You may see Alvan Carrier, MD or one of the following Advanced Practice Providers on your designated Care Team:   Laymon Qua, PA-C  [place], [place] Olivia [place]avy, [place]     We recommend signing up for the patient portal called MyChart.  Sign up information is provided on this After Visit Summary.  MyChart is used to connect with patients for Virtual Visits (Telemedicine).  Patients are able to view lab/test results, encounter notes, upcoming appointments, etc.  Non-urgent messages can be sent to your provider as well.   To learn more about what you can do with MyChart, go to [email or web address].

## 2024-03-08 ENCOUNTER — Ambulatory Visit: Admitting: Podiatry

## 2024-03-08 ENCOUNTER — Encounter: Payer: Self-pay | Admitting: Podiatry

## 2024-03-08 DIAGNOSIS — M79671 Pain in right foot: Secondary | ICD-10-CM | POA: Diagnosis not present

## 2024-03-08 DIAGNOSIS — L97522 Non-pressure chronic ulcer of other part of left foot with fat layer exposed: Secondary | ICD-10-CM | POA: Diagnosis not present

## 2024-03-08 DIAGNOSIS — M79672 Pain in left foot: Secondary | ICD-10-CM

## 2024-03-08 DIAGNOSIS — B351 Tinea unguium: Secondary | ICD-10-CM | POA: Diagnosis not present

## 2024-03-08 MED ORDER — MUPIROCIN 2 % EX OINT: 1.0000 | TOPICAL_OINTMENT | Freq: Two times a day (BID) | CUTANEOUS | 2 refills | Status: AC

## 2024-03-08 NOTE — Progress Notes (Signed)
 Patient presents for evaluation and treatment of tenderness and some redness around nails feet.  Tenderness around toes with walking and wearing shoes.  Also complains of extremely sore area and area under the fifth MTP on the left foot.  Has not noticed any drainage although it has been a little dark.  No fever or chills or nausea or vomiting.  Physical Exam:  Patient alert and oriented x 3.  No complaints of nausea, vomiting, fever, or chills  Vascular: DP pulses 2/4 bilateral. PT pulses 0/4 lateral.  Moderate edema lower extremity bilaterally capillary fill time immediate bilaterally.  Dermatologic: Full-thickness ulceration penetrating the subcutaneous tissue plantar aspect fifth metatarsal phalangeal joint left.  Ulcer is tender.  Moderate clear drainage.  No signs of infection.  Some granulation tissue is present.. Measures 6mm wide x 6 mm long x 4mm deep. Nails thickened, disfigured, discolored 1-5 BL with subungual debris.  Redness and hypertrophic nail folds along nail folds bilaterally but no signs of drainage or infection.  Neurologic:   Musculoskeletal: Tailor's bunion deformities bilaterally.  Hammertoe deformities 2 through 5 bilaterally  Diagnoses: 1.  Full-thickness Wagner grade 2 ulceration plantar left foot.. 2. Painful onychomycotic nails 1 through 5 bilaterally. 3. Pain toes 1 through 5 bilaterally.  Plan: -Discussed with him the ulceration and etiology and treatment.  Discussed precautions and needs to take as a diabetic.  Explained signs of infection he needs to watch out for.  If he notices any signs of infection he should call the office immediately.  -Sharp debridement full-thickness ulceration plantar left foot.  Sharply debrided any devitalized tissue into the subcutaneous tissue to a good bleeding base. -Wound care: Soak left foot twice daily 15 minutes in lukewarm Epsom salt water , apply Bactroban  ointment, and light dressing. -Rx Bactroban  ointment, apply to  ulcer twice daily after soaks.  Refill x 1 -Dispensed surgical shoe left foot.  -Debrided onychomycotic nails 1 through 5 bilaterally.   Return 1 week f/u ulcer in 3 months [facility]

## 2024-03-09 ENCOUNTER — Other Ambulatory Visit: Payer: Self-pay

## 2024-03-15 ENCOUNTER — Ambulatory Visit (INDEPENDENT_AMBULATORY_CARE_PROVIDER_SITE_OTHER): Admitting: Podiatry

## 2024-03-15 DIAGNOSIS — L97522 Non-pressure chronic ulcer of other part of left foot with fat layer exposed: Secondary | ICD-10-CM

## 2024-03-15 NOTE — Progress Notes (Signed)
 Patient presents follow-up ulceration left foot.  Says doing much better much less pain than he had before.  Said it is a little drainage on on the dressing and it is clear to slightly yellowish.  No fever or chills or nausea or vomiting.  Physical Exam:  Patient alert and oriented x 3.  No complaints of nausea, vomiting, fever, or chills  Vascular: DP pulses 2/4 bilateral. PT pulses 0/4 lateral.  Mild to moderate edema. Capillary fill time immediate.  Dermatologic: Full-thickness ulceration penetrating the subcutaneous tissue subfifth MTP left.  Clear drainage-mild to moderate.  No odors or signs of infection.  Predebridement ulcer measures 2mm wide x 2 mm long x 2mm deep.   Neurologic:   Musculoskeletal: It was bunion deformity left   Diagnoses: 1.  Full-thickness Wagner grade 2 ulceration plantar fifth MTP left foot.-Improved.  Plan: -Ulcer improving with current wound care.  Will continue soaking twice daily lukewarm salt water  for 15 minutes, apply Bactroban  ointment, and a light dressing.  Minimize weightbearing is much as possible.  -Sharp debridement full-thickness ulceration penetrating the subcutaneous tissue left foot subfifth MTP.  Debrided with tissue nippers and a 312 blade.  Sharply debrided any devitalized tissue to a good bleeding base.  Postdebridement ulcer measures 3 mm wide x 3 mm long x 3mm deep.  Return 2 weeks f/u ulcer

## 2024-03-20 ENCOUNTER — Other Ambulatory Visit: Payer: Self-pay | Admitting: Cardiology

## 2024-03-28 DIAGNOSIS — Z79899 Other long term (current) drug therapy: Secondary | ICD-10-CM | POA: Diagnosis not present

## 2024-03-28 DIAGNOSIS — N1831 Chronic kidney disease, stage 3a: Secondary | ICD-10-CM | POA: Diagnosis not present

## 2024-03-28 DIAGNOSIS — Z794 Long term (current) use of insulin: Secondary | ICD-10-CM | POA: Diagnosis not present

## 2024-03-28 DIAGNOSIS — K760 Fatty (change of) liver, not elsewhere classified: Secondary | ICD-10-CM | POA: Diagnosis not present

## 2024-03-28 DIAGNOSIS — E1122 Type 2 diabetes mellitus with diabetic chronic kidney disease: Secondary | ICD-10-CM | POA: Diagnosis not present

## 2024-03-28 DIAGNOSIS — N1832 Chronic kidney disease, stage 3b: Secondary | ICD-10-CM | POA: Diagnosis not present

## 2024-03-28 DIAGNOSIS — I10 Essential (primary) hypertension: Secondary | ICD-10-CM | POA: Diagnosis not present

## 2024-03-29 ENCOUNTER — Ambulatory Visit: Payer: Self-pay | Admitting: Family Medicine

## 2024-03-29 LAB — BASIC METABOLIC PANEL WITH GFR
BUN/Creatinine Ratio: 33 — ABNORMAL HIGH (ref 10–24)
BUN: 50 mg/dL — ABNORMAL HIGH (ref 8–27)
CO2: 24 mmol/L (ref 20–29)
Calcium: 9.6 mg/dL (ref 8.6–10.2)
Chloride: 102 mmol/L (ref 96–106)
Creatinine, Ser: 1.52 mg/dL — ABNORMAL HIGH (ref 0.76–1.27)
Glucose: 176 mg/dL — ABNORMAL HIGH (ref 70–99)
Potassium: 4.8 mmol/L (ref 3.5–5.2)
Sodium: 139 mmol/L (ref 134–144)
eGFR: 44 mL/min/1.73 — ABNORMAL LOW (ref >59)

## 2024-03-29 LAB — HEPATIC FUNCTION PANEL
ALT: 23 IU/L (ref 0–44)
AST: 31 IU/L (ref 0–40)
Albumin: 4.4 g/dL (ref 3.7–4.7)
Alkaline Phosphatase: 208 IU/L — ABNORMAL HIGH (ref 44–121)
Bilirubin Total: 1.3 mg/dL — ABNORMAL HIGH (ref 0.0–1.2)
Bilirubin, Direct: 0.71 mg/dL — ABNORMAL HIGH (ref 0.00–0.40)
Total Protein: 7.1 g/dL (ref 6.0–8.5)

## 2024-03-29 LAB — HEMOGLOBIN A1C
Est. average glucose Bld gHb Est-mCnc: 166 mg/dL
Hgb A1c MFr Bld: 7.4 % — ABNORMAL HIGH (ref 4.8–5.6)

## 2024-03-29 LAB — MICROALBUMIN / CREATININE URINE RATIO
Creatinine, Urine: 23.1 mg/dL
Microalb/Creat Ratio: 142 mg/g{creat} — ABNORMAL HIGH (ref 0–29)
Microalbumin, Urine: 32.8 ug/mL

## 2024-03-30 ENCOUNTER — Ambulatory Visit (INDEPENDENT_AMBULATORY_CARE_PROVIDER_SITE_OTHER): Admitting: Podiatry

## 2024-03-30 DIAGNOSIS — L97521 Non-pressure chronic ulcer of other part of left foot limited to breakdown of skin: Secondary | ICD-10-CM

## 2024-03-30 MED ORDER — MUPIROCIN 2 % EX OINT: 1.0000 | TOPICAL_OINTMENT | Freq: Two times a day (BID) | CUTANEOUS | 0 refills | Status: AC

## 2024-03-30 MED ORDER — MUPIROCIN 2 % EX OINT: 1.0000 | TOPICAL_OINTMENT | Freq: Two times a day (BID) | CUTANEOUS | 2 refills | Status: AC

## 2024-03-30 NOTE — Progress Notes (Signed)
 Presents today with a follow-up ulcer left foot.  Says seems to be doing better.  He has been soaking it however only intermittently has not been using the Bactroban  ointment.  No fever chills nausea or vomiting.  Physical Exam:  Patient alert and oriented x 3.  No complaints of nausea, vomiting, fever, or chills  Vascular: DP pulses 2/4 bilateral. PT pulses 0/4 lateral.  Mild to moderate edema. Capillary fill time immediate left.  Dermatologic: Superficial ulcer plantar fifth MTP left.  This penetrates into the dermis.  Area is tender.  Mild clear drainage.  Layer of granulation tissue is present.  No signs of infection.. Measures 5 mm wide x 6 mm long x 1.5 mm deep.   Neurologic:   Musculoskeletal:  Diagnoses: 1.  Superficial ulceration plantar fifth MTP left-improving.  Plan: -Ulcer is improving from a Wagner grade 2 to a Wagner grade 1 superficial ulcer just penetrating the dermis. -Discussed him doing the wound care as instructed I think this will allow it to fully heal.  Soaking twice daily 15 minutes Epsom salt water , apply Bactroban  ointment and a light dressing. -Debrided hyperkeratotic tissue from around ulcer left.  Return 2 weeks f/u ulcer left

## 2024-03-31 ENCOUNTER — Ambulatory Visit: Admitting: Family Medicine

## 2024-03-31 VITALS — BP 130/68 | HR 69 | Temp 97.2°F | Ht 72.0 in | Wt 205.0 lb

## 2024-03-31 DIAGNOSIS — E1169 Type 2 diabetes mellitus with other specified complication: Secondary | ICD-10-CM | POA: Diagnosis not present

## 2024-03-31 DIAGNOSIS — Z794 Long term (current) use of insulin: Secondary | ICD-10-CM

## 2024-03-31 DIAGNOSIS — E785 Hyperlipidemia, unspecified: Secondary | ICD-10-CM | POA: Diagnosis not present

## 2024-03-31 DIAGNOSIS — R2681 Unsteadiness on feet: Secondary | ICD-10-CM | POA: Diagnosis not present

## 2024-03-31 DIAGNOSIS — I10 Essential (primary) hypertension: Secondary | ICD-10-CM | POA: Diagnosis not present

## 2024-03-31 DIAGNOSIS — E1122 Type 2 diabetes mellitus with diabetic chronic kidney disease: Secondary | ICD-10-CM | POA: Diagnosis not present

## 2024-03-31 DIAGNOSIS — I4891 Unspecified atrial fibrillation: Secondary | ICD-10-CM

## 2024-03-31 DIAGNOSIS — N1832 Chronic kidney disease, stage 3b: Secondary | ICD-10-CM | POA: Diagnosis not present

## 2024-03-31 DIAGNOSIS — Z23 Encounter for immunization: Secondary | ICD-10-CM | POA: Diagnosis not present

## 2024-03-31 NOTE — Progress Notes (Signed)
 Subjective:    Patient ID: Jerry Burns, male    DOB: Jun 22, 1934, 89 y.o.   MRN: [id number]  HPI Discuss lab results A!C and labs Results for orders placed or performed in visit on 11/12/23  Microalbumin / creatinine urine ratio   Collection Time: 03/28/24  2:03 PM  Result Value Ref Range   Creatinine, Urine 23.1 Not Estab. mg/dL   Microalbumin, Urine 67.1 Not Estab. ug/mL   Microalb/Creat Ratio 142 (H) 0 - 29 mg/g creat  Hemoglobin A1c   Collection Time: 03/28/24  2:03 PM  Result Value Ref Range   Hgb A1c MFr Bld 7.4 (H) 4.8 - 5.6 %   Est. average glucose Bld gHb Est-mCnc 166 mg/dL  Hepatic function panel   Collection Time: 03/28/24  2:03 PM  Result Value Ref Range   Total Protein 7.1 6.0 - 8.5 g/dL   Albumin 4.4 3.7 - 4.7 g/dL   Bilirubin Total 1.3 (H) 0.0 - 1.2 mg/dL   Bilirubin, Direct 9.28 (H) 0.00 - 0.40 mg/dL   Alkaline Phosphatase 208 (H) 44 - 121 IU/L   AST 31 0 - 40 IU/L   ALT 23 0 - 44 IU/L  Basic metabolic panel with GFR   Collection Time: 03/28/24  2:03 PM  Result Value Ref Range   Glucose 176 (H) 70 - 99 mg/dL   BUN 50 (H) 8 - 27 mg/dL   Creatinine, Ser 8.47 (H) 0.76 - 1.27 mg/dL   eGFR 44 (L) >40 fO/fpw/8.26   BUN/Creatinine Ratio 33 (H) 10 - 24   Sodium 139 134 - 144 mmol/L   Potassium 4.8 3.5 - 5.2 mmol/L   Chloride 102 96 - 106 mmol/L   CO2 24 20 - 29 mmol/L   Calcium  9.6 8.6 - 10.2 mg/dL   His kidney function relatively stable He is concerned about his A1c The problem is his when he adjusts his insulin  he has low sugar spells and these can happen several times a day so therefore we do not want to be aggressive with treatment and provoke additional hypoglycemia because that could actually be very detrimental especially with his unsteady walk I do not find any evidence of a stroke on physical examination He has dry skin in the legs which can cause cracking and bleeding and also he has very thin skin because of the medicine that he is on and he is  more prone to bleeding Blood thinner concerns bleeding , bumping , fall risk, dizziness, unbalanced Covid shot questions    Review of Systems     Objective:   Physical Exam   General-in no acute distress Eyes-no discharge Lungs-respiratory rate normal, CTA CV-no murmur Extremities skin warm dry no edema Neuro grossly normal Behavior normal, alert  I did observe him walking he walks with what 1 expects at his age but at same time at times he wavers he is not in severe danger of falling but I did encourage him to use a cane or a walking staff    Assessment & Plan:  1. Immunization due (Primary) Today - Flu vaccine HIGH DOSE PF(Fluzone  Trivalent)  2. Type 2 diabetes mellitus with stage 3b chronic kidney disease, with long-term current use of insulin  (HCC) Continue current measures recheck labs again in several months healthy diet will not increase insulin  because we do not want to provoke low sugar spells  3. Essential hypertension Blood pressure reasonable control standing sitting and similar  4. Hyperlipidemia associated with type 2 diabetes mellitus (  HCC) Decent control continue current measures  5. Unsteady gait Use a cane or a walking staff  The bleeding is related to the fact he is on Eliquis  also it is related to thin skin we discussed ways to try to help with this  Follow-up in January lab work at that time

## 2024-04-07 ENCOUNTER — Telehealth: Payer: Self-pay

## 2024-04-07 NOTE — Telephone Encounter (Signed)
 PA request received for Mupirocin  2% ointment PA submitted and waiting on response.  CASEN PRYOR  (Key: [id number]) PA Case ID #: [id number] Rx #: [id number]

## 2024-04-10 ENCOUNTER — Other Ambulatory Visit: Payer: Self-pay | Admitting: Family Medicine

## 2024-04-10 MED ORDER — HUMALOG KWIKPEN 100 UNIT/ML ~~LOC~~ SOPN: PEN_INJECTOR | SUBCUTANEOUS | 1 refills | Status: DC

## 2024-04-10 NOTE — Telephone Encounter (Signed)
 FYI Only or Action Required?: Action required by provider: medication refill request.  Patient was last seen in primary care on 03/31/2024 by Alphonsa Glendia LABOR, MD.  Called Nurse Triage reporting No chief complaint on file..  Symptoms began today.  Interventions attempted: Nothing.  Symptoms are: stable.  Triage Disposition: No disposition on file.  Patient/caregiver understands and will follow disposition?:

## 2024-04-10 NOTE — Telephone Encounter (Unsigned)
 Copied from CRM [id number]. Topic: Clinical - Medication Refill >> Apr 10, 2024 11:47 AM Tinnie C wrote: Medication: HUMALOG  KWIKPEN 100 UNIT/ML KwikPen   Has the patient contacted their pharmacy? Yes (Agent: If no, request that the patient contact the pharmacy for the refill. If patient does not wish to contact the pharmacy document the reason why and proceed with request.) (Agent: If yes, when and what did the pharmacy advise?)  No refill authorized.  This is the patient's preferred pharmacy:  [facility] [place], [place] - [id number] [place] [address] Phone: [phone] Fax: [phone]  Is this the correct pharmacy for this prescription? Yes If no, delete pharmacy and type the correct one.   Has the prescription been filled recently? Yes  Is the patient out of the medication? No, but running short.   Has the patient been seen for an appointment in the last year OR does the patient have an upcoming appointment? Yes  Can we respond through MyChart? Yes  Agent: Please be advised that Rx refills may take up to 3 business days. We ask that you follow-up with your pharmacy.

## 2024-04-13 ENCOUNTER — Ambulatory Visit: Admitting: Family Medicine

## 2024-04-17 ENCOUNTER — Ambulatory Visit (INDEPENDENT_AMBULATORY_CARE_PROVIDER_SITE_OTHER): Admitting: Podiatry

## 2024-04-17 ENCOUNTER — Encounter: Payer: Self-pay | Admitting: Podiatry

## 2024-04-17 DIAGNOSIS — L97522 Non-pressure chronic ulcer of other part of left foot with fat layer exposed: Secondary | ICD-10-CM | POA: Diagnosis not present

## 2024-04-17 MED ORDER — AMOXICILLIN-POT CLAVULANATE 875-125 MG PO TABS: 1.0000 | ORAL_TABLET | Freq: Two times a day (BID) | ORAL | 0 refills | Status: DC

## 2024-04-17 NOTE — Progress Notes (Signed)
 Subjective:  Patient ID: Jerry Burns, male    DOB: 02/12/1934,  MRN: [id number]  Chief Complaint  Patient presents with   Foot Ulcer    Patient is here for bilateral foot ulcer 5th toe area healing slow but well    89 y.o. male presents for wound care.  Patient presents with complaint of the ulceration plantar foot.  Says getting a little sore the past few days.  Has not had any nausea or vomiting urine or fever or chills.   Review of Systems: Negative except as noted in the HPI. Denies N/V/F/Ch.  Past Medical History:  Diagnosis Date   Anginal pain    Aortic atherosclerosis (HCC) 10/06/2020   Seen on CAT scan 2021   Arthritis    Bilateral knee   Atrial fibrillation (HCC) 12/28/2021   On Eliquis    CHF (congestive heart failure) (HCC)    Coronary artery disease    Diabetes mellitus    insulin  dependent    DVT, lower extremity, distal, acute (HCC) 09/09/2018   Hyperlipidemia    Hypertension    Progressive angina (HCC) 03/2015   a. s/p DES to proximal LAD and balloon angioplasty to distal LAD in 2016 b. patent stent by repeat cath in 01/2017 with mild residual CAD)   Pulmonary nodule 10/06/2020   Seen on CAT scan 2020 1 repeat CAT scan in 2022   Stroke [facility])     Current Outpatient Medications:    Ascorbic Acid (VITAMIN C) 1000 MG tablet, Take 1,000 mg by mouth daily. , Disp: , Rfl:    atorvastatin  (LIPITOR ) 80 MG tablet, TAKE (1) TABLET BY MOUTH ONCE DAILY., Disp: 90 tablet, Rfl: 3   augmented betamethasone dipropionate (DIPROLENE-AF) 0.05 % ointment, Apply topically 2 (two) times daily as needed., Disp: , Rfl:    B-D UF III MINI PEN NEEDLES 31G X 5 MM MISC, SMARTSIG:1 Each SUB-Q Twice Daily, Disp: , Rfl:    BD PEN NEEDLE NANO 2ND GEN 32G X 4 MM MISC, USE 1 PEN NEEDLE FIVE TIMES DAILY, Disp: , Rfl:    carvedilol  (COREG ) 3.125 MG tablet, Take 1 tablet (3.125 mg total) by mouth 2 (two) times daily with a meal., Disp: 60 tablet, Rfl: 3   COMBIGAN  0.2-0.5 % ophthalmic solution,  Place 1 drop into the left eye 2 (two) times daily. , Disp: , Rfl: 2   Cyanocobalamin  (B-12) 2500 MCG TABS, Take 1 tablet by mouth 2 (two) times a week. Three times a week on Monday Wednesday and Friday, Disp: , Rfl:    ELIQUIS  2.5 MG TABS tablet, TAKE (1) TABLET BY MOUTH TWICE DAILY., Disp: 60 tablet, Rfl: 5   erythromycin ophthalmic ointment, Place 1 application  into the left eye 3 (three) times daily as needed (infection)., Disp: , Rfl:    Flaxseed, Linseed, (FLAXSEED OIL PO), Take 1 capsule by mouth daily. , Disp: , Rfl:    glucose blood (CONTOUR NEXT TEST) test strip, USE 1 STRIP TO CHECK GLUCOSE THREE TIMES DAILY, Disp: 300 each, Rfl: 0   HUMALOG  KWIKPEN 100 UNIT/ML KwikPen, INJECT TWICE DAILY PER SLIDING SCALE FOLLOWING BREAKFAST AND DINNER: <85=0UNITS, 85-110=3 UNITS, 111-160 = 5, 161-220 = 7, >221 = 9 UNITS, Disp: 15 mL, Rfl: 1   isosorbide  mononitrate (IMDUR ) 30 MG 24 hr tablet, Take 1 tablet (30 mg total) by mouth daily., Disp: 90 tablet, Rfl: 3   JARDIANCE  10 MG TABS tablet, Take 1 tablet by mouth daily before breakfast., Disp: 30 tablet, Rfl: 5  ketoconazole  (NIZORAL ) 2 % cream, Apply 1 Application topically 2 (two) times daily., Disp: 120 g, Rfl: 4   Lactobacillus (PROBIOTIC ACIDOPHILUS) CAPS, Take 1 capsule by mouth daily at 6 (six) AM., Disp: , Rfl:    LANTUS  SOLOSTAR 100 UNIT/ML Solostar Pen, INJECT 24 TO 25 UNITS SUBCUTANEOUSLY ONCE DAILY AT 10 AM, Disp: 15 mL, Rfl: 2   Multiple Vitamin (MULTIVITAMIN) tablet, Take 1 tablet by mouth daily., Disp: , Rfl:    mupirocin  ointment (BACTROBAN ) 2 %, Apply 1 Application topically 2 (two) times daily., Disp: 30 g, Rfl: 2   mupirocin  ointment (BACTROBAN ) 2 %, Apply 1 Application topically 2 (two) times daily., Disp: 60 g, Rfl: 0   mupirocin  ointment (BACTROBAN ) 2 %, Apply 1 Application topically 2 (two) times daily., Disp: 44 g, Rfl: 2   nitroGLYCERIN  (NITROSTAT ) 0.4 MG SL tablet, PLACE 1 TAB UNDER TONGUE EVERY 5 MIN IF NEEDED FOR CHEST PAIN.  MAY USE 3 TIMES.NO RELIEF CALL 911., Disp: 25 tablet, Rfl: 3   polyethylene glycol powder (MIRALAX ) 17 GM/SCOOP powder, Start by taking 1/2 capful mixed in 4 ounces of fluid daily. You can increase to 1 capful daily if needed to maintain easy BMs., Disp: 527 g, Rfl: 5   Polyvinyl Alcohol-Povidone (REFRESH OP), Place 1 drop into both ears 2 (two) times daily as needed (dryness)., Disp: , Rfl:    tamsulosin  (FLOMAX ) 0.4 MG CAPS capsule, Take 2 capsules by mouth at bedtime. if this causes drowsiness or dizziness to reduce it to just 1 capsule per day., Disp: 180 capsule, Rfl: 1   torsemide  (DEMADEX ) 20 MG tablet, Take 1 tablet (20 mg total) by mouth daily., Disp: 60 tablet, Rfl: 5   Turmeric 500 MG CAPS, Take 500 mg by mouth daily., Disp: , Rfl:    VELTASSA 8.4 g packet, Take 1 packet by mouth daily., Disp: , Rfl:   Social History   Tobacco Use  Smoking Status Former   Current packs/day: 0.00   Average packs/day: 1 pack/day for 52.0 years (52.0 ttl pk-yrs)   Types: Cigarettes, Cigars   Start date: 08/10/1955   Quit date: 07/28/2007   Years since quitting: 16.7   Passive exposure: Never  Smokeless Tobacco Never    Allergies  Allergen Reactions   Adhesive [Tape]     bandaids cause blisters after leaving on for 24 hours    Sulfa Antibiotics Other (See Comments)    was terrible eye went crazy   Latex Other (See Comments)    blisters   Objective:  There were no vitals filed for this visit. There is no height or weight on file to calculate BMI. Constitutional Well developed. Well nourished.  Vascular Dorsalis pedis pulses palpable bilaterally. Posterior tibial pulses palpable bilaterally. Capillary refill normal to all digits.  No cyanosis or clubbing noted. Pedal hair growth normal.  Neurologic Normal speech. Oriented to person, place, and time. Protective sensation absent  Dermatologic Wound Location: Plantar fifth MTP left Wound Base: Mixed Granular/Fibrotic Peri-wound:  Macerated Exudate: Moderate purulence exudate Wound Measurements: - 0.2 cm x 0.2 cm predebridement  Orthopedic: No pain to palpation either foot.    Assessment:   1. Ulcer of left foot with fat layer exposed (HCC) [L97.522]    Plan:  Patient was evaluated and treated and all questions answered.  Ulcer had been doing better and almost healed has deepened again and developed an infection.  Ulcer plantar fifth MTP left. -Debridement as below. -Dressed with Silvadene, DSD. - Use off-loading with surgical  shoe.  Procedure: Excisional Debridement of Wound Tool: Sharp #312 chisel blade/tissue nipper Type of Debridement: Sharp Excisional Frequency: @Once  weekly until appropriately healed.  Dressing is to be changed daily/keeping the wound clean and dry Rationale: Removal of non-viable soft tissue from the wound to promote healing.  Anesthesia: none Pre-Debridement Wound Measurements: 0.2 cm x 0.2 cm x 0.1 cm  Post-Debridement Wound Measurements: 0.4 cm x 0.4 cm x 0.4 cm  Area devitalized tissue removed(nonviable tissue only): 0.4 cm x 0.4 cm.  Blood loss: Minimal (<50cc) Depth of Debridement: with fat layer exposed Description of tissue removed: Devitalized Tissue Technique: The wound and the surrounding skin were prepped and draped in usual aseptic fashion.  Aseptic technique was maintained throughout the procedure.  Using #312 blade/tissue nipper sharp debridement of necrotic/nonviable tissue was performed until healthy bleeding wound bed was achieved.  No underlying bone or tendon was exposed during debridement.  The wound was thoroughly irrigated with normal saline solution Wound Progress:  Current Wound Volume: Debridement was performed of the chronic nonhealing diabetic foot wound on plantar foot left fifth MTP Debridement removed 0.4 cm x 0.4 cm of the necrotic tissue and subcutaneous tissue and moderate amount of purulent drainage was present. Presence/absence of tissue: Necrotic  tissue/nonviable tissue present at the base of the wound.  Sharp debridement was performed to remove the necrotic tissue/nonviable tissue back to viable tissue.  No devitalized/nonviable tissue present postdebridement.  Wound appeared clean and clear of infection No material in the wound was present that was identified to be inhibiting healing. Dressing: Dry, sterile, compression dressing. Disposition: Patient tolerated procedure well. Patient to return in 1 week for follow-up or as listed above.  Rx Augmentin  800 Stipe milligrams, 1 p.o. twice daily for 7 days  Culture of purulent exudate sent for evaluation.  Return in about 1 week (around 04/24/2024) for f/u ulcer.

## 2024-04-18 ENCOUNTER — Ambulatory Visit: Admitting: Family Medicine

## 2024-04-24 DIAGNOSIS — L97522 Non-pressure chronic ulcer of other part of left foot with fat layer exposed: Secondary | ICD-10-CM | POA: Diagnosis not present

## 2024-04-24 NOTE — Addendum Note (Signed)
 Addended by: NICHOLAUS MARJORIE SAILOR on: 04/24/2024 10:22 AM   Modules accepted: Orders

## 2024-04-25 ENCOUNTER — Encounter: Payer: Self-pay | Admitting: Podiatry

## 2024-04-25 ENCOUNTER — Ambulatory Visit (INDEPENDENT_AMBULATORY_CARE_PROVIDER_SITE_OTHER): Admitting: Podiatry

## 2024-04-25 VITALS — Ht 72.0 in | Wt 205.0 lb

## 2024-04-25 DIAGNOSIS — L97501 Non-pressure chronic ulcer of other part of unspecified foot limited to breakdown of skin: Secondary | ICD-10-CM

## 2024-04-25 NOTE — Progress Notes (Signed)
 Patient presents for follow-up of ulceration left foot.  Says it seems to be doing better not as much pain.  No fever chills nausea or vomiting.  Noticed minimal drainage.  Physical Exam:  Patient alert and oriented x 3.  No complaints of nausea, vomiting, fever, or chills  Vascular: DP pulses 2/4 bilateral. PT pulses 2/4 lateral.  Mild edema lower extremity bilaterally. Capillary fill time immediate bilaterally.  Dermatologic: Superficial ulceration subfifth MTP left.  Slight clear drainage at ulcer site.  No deep penetration.. Measures 3 mm wide x 3 mm long x 1 deep after debridement.  Neurologic:   Musculoskeletal:   Diagnoses: 1.  Superficial Wagner grade 1 ulceration subfifth MTP left foot..  Plan: -S/p visit for evaluation and management level 3. - I discussed some superficial ulcer almost healed.  Told him to keep doing wound care soaking it twice daily warm salt water  applying Bactroban  ointment and light dressing.  Watch for any signs of infection. -Lightly debrided ulcer today.  Applied a antibiotic ointment and a dressing    Return 2weeks  f/u ulcer

## 2024-05-15 ENCOUNTER — Ambulatory Visit (INDEPENDENT_AMBULATORY_CARE_PROVIDER_SITE_OTHER): Admitting: Podiatry

## 2024-05-15 DIAGNOSIS — M7751 Other enthesopathy of right foot: Secondary | ICD-10-CM

## 2024-05-15 DIAGNOSIS — M7752 Other enthesopathy of left foot: Secondary | ICD-10-CM

## 2024-05-15 NOTE — Progress Notes (Signed)
 Patient presents with complaint of pain of the fifth MTPs bilaterally.  Has not noticed any drainage.  He continues to soak.  Had an ulcer on the right foot.   Physical exam:  General appearance: Pleasant, and in no acute distress. AOx3.  Vascular: Pedal pulses: DP 2/4 bilaterally, PT 0/4 bilaterally.  Moderate edema lower legs bilaterally. Capillary fill time immediate bilaterally.  Neurological: Grossly intact bilaterally  Dermatologic:   Area of ulceration plantar fifth MTP right foot has healed with no signs of infection.  Skin thin atrophic with no hair growth on lower extremity bilaterally severely atrophic fat pad and plantar foot.  Musculoskeletal: Tenderness to the plantar aspect fifth MTP bilaterally with palpation.  No tenderness with range of motion of the fifth MTP.    Diagnosis: 1.  Bursitis subfifth MTP bilaterally  Plan: -Established office visit for evaluation and management.  Level 3. -Ulcer is healed he can discontinue wound care on the right foot.  He is wearing diabetic shoes with custom insoles.  I think for additional padding he needs to wear some socks with more padding and cushioning in the forefoot.  Gave him several recommendations.     Return 4 to 5 weeks for [facility]

## 2024-06-12 ENCOUNTER — Ambulatory Visit: Admitting: Podiatry

## 2024-06-14 ENCOUNTER — Ambulatory Visit (INDEPENDENT_AMBULATORY_CARE_PROVIDER_SITE_OTHER): Admitting: Podiatry

## 2024-06-14 ENCOUNTER — Encounter: Payer: Self-pay | Admitting: Podiatry

## 2024-06-14 DIAGNOSIS — M79672 Pain in left foot: Secondary | ICD-10-CM | POA: Diagnosis not present

## 2024-06-14 DIAGNOSIS — B351 Tinea unguium: Secondary | ICD-10-CM | POA: Diagnosis not present

## 2024-06-14 DIAGNOSIS — M79671 Pain in right foot: Secondary | ICD-10-CM

## 2024-06-14 NOTE — Progress Notes (Addendum)
 Patient presents for evaluation and treatment of tenderness and some redness around nails feet.  Tenderness around toes with walking and wearing shoes.  Physical exam:  General appearance: Alert, pleasant, and in no acute distress.  Vascular: Pedal pulses: DP 2/4 B/L, PT 0/4 B/L. Moderate edema lower legs bilaterally.  Capillary refill time immediate bilaterally  Neurologic:  Dermatologic:  Nails thickened, disfigured, discolored 1-5 BL with subungual debris.  Redness and hypertrophic nail folds along nail folds bilaterally but no signs of drainage or infection.  Musculoskeletal:     Diagnosis: 1. Painful onychomycotic nails 1 through 5 bilaterally. 2. Pain toes 1 through 5 bilaterally.  Plan: -Debrided onychomycotic nails 1 through 5 bilaterally.  Sharply debrided nails with nail clipper and reduced with a power bur.  -Order for diabetic shoes and custom insoles.  1 pair each.  Diagnosis: Type 2 diabetes with history of ulceration feet  Return 3 months [facility]

## 2024-06-15 ENCOUNTER — Telehealth: Payer: Self-pay | Admitting: Family Medicine

## 2024-06-15 NOTE — Telephone Encounter (Signed)
 Copied from CRM [id number]. Topic: General - Other >> Jun 15, 2024 10:12 AM Zebedee SAUNDERS wrote: Reason for CRM: Pt called stated he is returning Ben's call who was discussing medications with pt's wife. Please call pt at [phone].

## 2024-06-19 ENCOUNTER — Telehealth: Payer: Self-pay | Admitting: Family Medicine

## 2024-06-19 ENCOUNTER — Other Ambulatory Visit: Payer: Self-pay

## 2024-06-19 DIAGNOSIS — N401 Enlarged prostate with lower urinary tract symptoms: Secondary | ICD-10-CM

## 2024-06-19 MED ORDER — TAMSULOSIN HCL 0.4 MG PO CAPS: ORAL_CAPSULE | ORAL | 1 refills | Status: AC

## 2024-06-19 NOTE — Telephone Encounter (Signed)
 Refill on tamsulosin  (FLOMAX ) 0.4 MG CAPS capsule  [facility]

## 2024-06-26 ENCOUNTER — Telehealth: Payer: Self-pay

## 2024-06-26 DIAGNOSIS — E119 Type 2 diabetes mellitus without complications: Secondary | ICD-10-CM | POA: Diagnosis not present

## 2024-06-26 DIAGNOSIS — H2000 Unspecified acute and subacute iridocyclitis: Secondary | ICD-10-CM | POA: Diagnosis not present

## 2024-06-26 DIAGNOSIS — I69398 Other sequelae of cerebral infarction: Secondary | ICD-10-CM | POA: Diagnosis not present

## 2024-06-26 DIAGNOSIS — H40053 Ocular hypertension, bilateral: Secondary | ICD-10-CM | POA: Diagnosis not present

## 2024-06-26 LAB — OPHTHALMOLOGY REPORT-SCANNED

## 2024-06-26 NOTE — Telephone Encounter (Signed)
 Copied from CRM [id number]. Topic: Clinical - Medication Question >> Jun 26, 2024  2:12 PM Geneva B wrote: Reason for CRM: patient is calling has questions about his rx torsemide  please call pt back [phone] (M)

## 2024-06-27 NOTE — Telephone Encounter (Signed)
 Left a message for the patient to return the call with any questions about medication torsemide 

## 2024-06-28 ENCOUNTER — Telehealth: Payer: Self-pay | Admitting: Cardiology

## 2024-06-28 ENCOUNTER — Telehealth: Payer: Self-pay

## 2024-06-28 ENCOUNTER — Other Ambulatory Visit: Payer: Self-pay

## 2024-06-28 ENCOUNTER — Inpatient Hospital Stay: Payer: Medicare Other | Attending: Oncology

## 2024-06-28 DIAGNOSIS — N1831 Chronic kidney disease, stage 3a: Secondary | ICD-10-CM | POA: Insufficient documentation

## 2024-06-28 DIAGNOSIS — D631 Anemia in chronic kidney disease: Secondary | ICD-10-CM | POA: Insufficient documentation

## 2024-06-28 DIAGNOSIS — K922 Gastrointestinal hemorrhage, unspecified: Secondary | ICD-10-CM | POA: Insufficient documentation

## 2024-06-28 DIAGNOSIS — D649 Anemia, unspecified: Secondary | ICD-10-CM | POA: Insufficient documentation

## 2024-06-28 DIAGNOSIS — E538 Deficiency of other specified B group vitamins: Secondary | ICD-10-CM | POA: Insufficient documentation

## 2024-06-28 LAB — CBC WITH DIFFERENTIAL/PLATELET
Abs Immature Granulocytes: 0.02 K/uL (ref 0.00–0.07)
Basophils Absolute: 0.1 K/uL (ref 0.0–0.1)
Basophils Relative: 1 %
Eosinophils Absolute: 0.3 K/uL (ref 0.0–0.5)
Eosinophils Relative: 4 %
HCT: 37.9 % — ABNORMAL LOW (ref 39.0–52.0)
Hemoglobin: 12.4 g/dL — ABNORMAL LOW (ref 13.0–17.0)
Immature Granulocytes: 0 %
Lymphocytes Relative: 11 %
Lymphs Abs: 0.7 K/uL (ref 0.7–4.0)
MCH: 32.7 pg (ref 26.0–34.0)
MCHC: 32.7 g/dL (ref 30.0–36.0)
MCV: 100 fL (ref 80.0–100.0)
Monocytes Absolute: 0.8 K/uL (ref 0.1–1.0)
Monocytes Relative: 12 %
Neutro Abs: 4.9 K/uL (ref 1.7–7.7)
Neutrophils Relative %: 72 %
Platelets: 141 K/uL — ABNORMAL LOW (ref 150–400)
RBC: 3.79 MIL/uL — ABNORMAL LOW (ref 4.22–5.81)
RDW: 14.3 % (ref 11.5–15.5)
WBC: 6.8 K/uL (ref 4.0–10.5)
nRBC: 0 % (ref 0.0–0.2)

## 2024-06-28 LAB — FERRITIN: Ferritin: 138 ng/mL (ref 24–336)

## 2024-06-28 LAB — COMPREHENSIVE METABOLIC PANEL WITH GFR
ALT: 31 U/L (ref 0–44)
AST: 41 U/L (ref 15–41)
Albumin: 4.7 g/dL (ref 3.5–5.0)
Alkaline Phosphatase: 219 U/L — ABNORMAL HIGH (ref 38–126)
Anion gap: 14 (ref 5–15)
BUN: 44 mg/dL — ABNORMAL HIGH (ref 8–23)
CO2: 25 mmol/L (ref 22–32)
Calcium: 9.9 mg/dL (ref 8.9–10.3)
Chloride: 105 mmol/L (ref 98–111)
Creatinine, Ser: 1.52 mg/dL — ABNORMAL HIGH (ref 0.61–1.24)
GFR, Estimated: 44 mL/min — ABNORMAL LOW (ref >60)
Glucose, Bld: 130 mg/dL — ABNORMAL HIGH (ref 70–99)
Potassium: 4 mmol/L (ref 3.5–5.1)
Sodium: 145 mmol/L (ref 135–145)
Total Bilirubin: 1.2 mg/dL (ref 0.0–1.2)
Total Protein: 8.2 g/dL — ABNORMAL HIGH (ref 6.5–8.1)

## 2024-06-28 LAB — IRON AND TIBC
Iron: 49 ug/dL (ref 45–182)
Saturation Ratios: 13 % — ABNORMAL LOW (ref 17.9–39.5)
TIBC: 368 ug/dL (ref 250–450)
UIBC: 320 ug/dL

## 2024-06-28 LAB — VITAMIN B12: Vitamin B-12: 3012 pg/mL — ABNORMAL HIGH (ref 180–914)

## 2024-06-28 MED ORDER — TORSEMIDE 20 MG PO TABS: 20.0000 mg | ORAL_TABLET | Freq: Every day | ORAL | 3 refills | Status: AC

## 2024-06-28 NOTE — Telephone Encounter (Signed)
 Pt came into the office and stated that he is running out of his torsemide , his rx was filled for 1 a day but he says Brittany told him to take an extra one if he was getting heavy. Pt stated he is due for a refill on December 11th but because he has used the extras he will run out. He uses [facility]. [phone] is the best number to reach the patient.

## 2024-06-28 NOTE — Telephone Encounter (Signed)
 See other message also requesting for a refill , previously prescribed by cardiology   Copied from CRM #[id number]. Topic: Clinical - Medical Advice >> Jun 27, 2024 10:17 AM Shereese L wrote: Reason for CRM: patient is stating that he's going to be out off medicine prior to refill date because as instructed he was adv to take depending on fluid retention. Please call patient Cb#[phone]

## 2024-06-28 NOTE — Telephone Encounter (Signed)
 Spoke with Mr Plotkin, and he says he just came back from Dr Ranae office and he will send the torsemide  in for him, taking 1 daily except when has leg swelling which is about 4 x per week takes 2 , also will send a message about pt's wife.

## 2024-06-28 NOTE — Telephone Encounter (Signed)
 02/18/24 office note of B.Strader,PA-C   2. Chronic heart failure with preserved ejection fraction (HCC) - Echocardiogram in 2022 showed a preserved EF of 55 to 60% with normal RV function. He denies any recent respiratory issues except for in the warmer temperatures. He does have pitting edema on examination today and he is listed as taking either Torsemide  20 mg daily or 40 mg daily but is actually taking 10 mg daily.  Encouraged him to at least take 20 mg given his pitting edema. If he develops worsening orthostasis, could reduce to 20 mg every other day. He is also on Jardiance  10 mg daily as well.     AVS for patient recommended patient take torsemide  20 mg daily  Refill sent to [facility]

## 2024-06-28 NOTE — Telephone Encounter (Signed)
 How is patient taking medication currently in regards to the torsemide ? Is he doing 1 per morning or is he doing 1 twice a day? This will allow us  to write the proper prescription and give the proper advice

## 2024-07-01 LAB — METHYLMALONIC ACID, SERUM: Methylmalonic Acid, Quantitative: 318 nmol/L (ref 0–378)

## 2024-07-05 ENCOUNTER — Inpatient Hospital Stay (HOSPITAL_BASED_OUTPATIENT_CLINIC_OR_DEPARTMENT_OTHER): Payer: Medicare Other | Admitting: Oncology

## 2024-07-05 VITALS — BP 121/71 | HR 69 | Temp 97.2°F | Resp 18 | Ht 72.0 in | Wt 215.0 lb

## 2024-07-05 DIAGNOSIS — R7989 Other specified abnormal findings of blood chemistry: Secondary | ICD-10-CM | POA: Diagnosis not present

## 2024-07-05 DIAGNOSIS — D631 Anemia in chronic kidney disease: Secondary | ICD-10-CM | POA: Diagnosis not present

## 2024-07-05 DIAGNOSIS — D649 Anemia, unspecified: Secondary | ICD-10-CM | POA: Diagnosis not present

## 2024-07-05 DIAGNOSIS — N1831 Chronic kidney disease, stage 3a: Secondary | ICD-10-CM | POA: Diagnosis not present

## 2024-07-05 MED ORDER — VITAMIN C 250 MG PO TABS: 250.0000 mg | ORAL_TABLET | ORAL | 2 refills | Status: AC

## 2024-07-05 NOTE — Assessment & Plan Note (Addendum)
 Discussed decreasing his B12 supplements from daily to every other day along with his iron and vitamin C tablets. Recheck these labs in 3 months.

## 2024-07-05 NOTE — Progress Notes (Signed)
 [facility] OFFICE PROGRESS NOTE  Jerry Burns LABOR, MD  ASSESSMENT & PLAN:    Assessment & Plan Normocytic anemia - He is currently taking iron tablets daily. -He has not received IV iron since 2021. -No bright red blood per rectum or melena - He takes Eliquis  for atrial fibrillation. - He has chronic fatigue, which he reports is slightly improved from previous - Most recent labs from 06/28/2024 show hemoglobin of 12.4, platelets 141-stable chronic CKD creatinine 1.52.  Elevated B12 level 3012 and iron saturation is 13%.  Ferritin 138. -Given iron saturations have dropped and he is still anemic, would recommend 1 additional dose of IV Feraheme .  He was agreeable. -Recommend starting iron tablets 325 mg ferrous sulfate every other day along with vitamin C. -Return to clinic in 3 months with labs a few days before. Anemia in stage 3a chronic kidney disease (HCC) -She has stable chronic CKD with creatinine between 1.3 and 1.6. -Most recent creatinine from 06/28/2024 is 1.52. -Continue follow-up with nephrology. Elevated vitamin B12 level Discussed decreasing his B12 supplements from daily to every other day along with his iron and vitamin C tablets. Recheck these labs in 3 months.  Orders Placed This Encounter  Procedures   CBC with Differential/Platelet    Standing Status:   Future    Expected Date:   10/03/2024    Expiration Date:   01/01/2025    Release to patient:   Immediate   Comprehensive metabolic panel    Standing Status:   Future    Expected Date:   10/03/2024    Expiration Date:   01/01/2025   Methylmalonic acid, serum    Standing Status:   Future    Expected Date:   10/03/2024    Expiration Date:   01/01/2025   Ferritin    Standing Status:   Future    Expected Date:   10/03/2024    Expiration Date:   01/01/2025    Release to patient:   Immediate   Iron and TIBC    Standing Status:   Future    Expected Date:   10/03/2024    Expiration Date:   01/01/2025     Release to patient:   Immediate   Vitamin B12    Standing Status:   Future    Expected Date:   10/03/2024    Expiration Date:   01/01/2025    INTERVAL HISTORY: Patient returns for follow-up for normocytic anemia.  Denies any hospitalizations, surgeries or changes to baseline health.  He has received IV Feraheme  in the past last given on 10/06/2019 and 10/13/2019.  He is currently taking iron supplements.  Denies any bright red blood per rectum, melena or hematochezia.  Reports appetite is 100% energy levels are low.  He reports 6 out of 10 foot pain.   He has not consistently been taking his iron supplements.  He and B12 supplements daily.  We reviewed CBC, CMP, iron panel, MMA, B12.  SUMMARY OF HEMATOLOGIC HISTORY: Oncology History   No history exists.    - Thought to be from GI blood loss and anemia of CKD (CKD stage IIIa) - He had a hemoglobin of 9.7 in September 2019 followed by a GI work-up. - He had an EGD on 05/18/2018 showed erosive reflux esophagitis, erosive gastropathy.  Biopsy was consistent with chronic active gastritis with H. pylori.  It was thought that he could have been bleeding from upper GI. - Colonoscopy on 05/18/2018 showed diverticulosis of  the sigmoid colon, descending colon and transverse colon.  Tubular adenoma from hepatic flexure was resected. - During his initial work up, stool for occult blood x3 was negative.  Direct Coombs test and LDH were normal.  B12 and folic acid  were normal.  SPEP normal. - Last Feraheme  on 10/06/2019 and 10/13/2019. - He is taking oral ferrous sulfate 3 times per week and is tolerating it well.  Also takes vitamin B12 supplements at home. CBC    Component Value Date/Time   WBC 6.8 06/28/2024 1049   RBC 3.79 (L) 06/28/2024 1049   HGB 12.4 (L) 06/28/2024 1049   HGB 12.9 (L) 12/31/2021 0849   HCT 37.9 (L) 06/28/2024 1049   HCT 39.4 12/31/2021 0849   PLT 141 (L) 06/28/2024 1049   PLT 145 (L) 12/31/2021 0849   MCV 100.0 06/28/2024  1049   MCV 95 12/31/2021 0849   MCH 32.7 06/28/2024 1049   MCHC 32.7 06/28/2024 1049   RDW 14.3 06/28/2024 1049   RDW 12.4 12/31/2021 0849   LYMPHSABS 0.7 06/28/2024 1049   LYMPHSABS 0.8 12/11/2019 1037   MONOABS 0.8 06/28/2024 1049   EOSABS 0.3 06/28/2024 1049   EOSABS 0.3 12/11/2019 1037   BASOSABS 0.1 06/28/2024 1049   BASOSABS 0.1 12/11/2019 1037       Latest Ref Rng & Units 06/28/2024   10:49 AM 03/28/2024    2:03 PM 11/03/2023    2:11 PM  CMP  Glucose 70 - 99 mg/dL 869  823  803   BUN 8 - 23 mg/dL 44  50  42   Creatinine 0.61 - 1.24 mg/dL 8.47  8.47  8.53   Sodium 135 - 145 mmol/L 145  139  142   Potassium 3.5 - 5.1 mmol/L 4.0  4.8  4.6   Chloride 98 - 111 mmol/L 105  102  104   CO2 22 - 32 mmol/L 25  24  23    Calcium  8.9 - 10.3 mg/dL 9.9  9.6  9.8   Total Protein 6.5 - 8.1 g/dL 8.2  7.1    Total Bilirubin 0.0 - 1.2 mg/dL 1.2  1.3    Alkaline Phos 38 - 126 U/L 219  208    AST 15 - 41 U/L 41  31    ALT 0 - 44 U/L 31  23       Lab Results  Component Value Date   FERRITIN 138 06/28/2024   VITAMINB12 3,012 (H) 06/28/2024    Vitals:   07/05/24 1015  BP: 121/71  Pulse: 69  Resp: 18  Temp: (!) 97.2 F (36.2 C)  SpO2: 100%    Review of System:  Review of Systems  Constitutional:  Positive for malaise/fatigue.  Musculoskeletal:        Foot pain  Neurological:  Negative for dizziness.    Physical Exam: Physical Exam Constitutional:      Appearance: Normal appearance.  HENT:     Head: Normocephalic and atraumatic.  Eyes:     Pupils: Pupils are equal, round, and reactive to light.  Cardiovascular:     Rate and Rhythm: Normal rate and regular rhythm.     Heart sounds: Normal heart sounds. No murmur heard. Pulmonary:     Effort: Pulmonary effort is normal.     Breath sounds: Normal breath sounds. No wheezing.  Abdominal:     General: Bowel sounds are normal. There is no distension.     Palpations: Abdomen is soft.     Tenderness: There  is no abdominal  tenderness.  Musculoskeletal:        General: Normal range of motion.     Cervical back: Normal range of motion.  Skin:    General: Skin is warm and dry.     Findings: No rash.  Neurological:     Mental Status: He is alert and oriented to person, place, and time.     Gait: Gait is intact.  Psychiatric:        Mood and Affect: Mood and affect normal.        Cognition and Memory: Memory normal.        Judgment: Judgment normal.      I spent 25 minutes dedicated to the care of this patient (face-to-face and non-face-to-face) on the date of the encounter to include what is described in the assessment and plan.,  Delon Hope, NP 07/05/2024 12:55 PM

## 2024-07-05 NOTE — Assessment & Plan Note (Addendum)
-   He is currently taking iron tablets daily. -He has not received IV iron since 2021. -No bright red blood per rectum or melena - He takes Eliquis  for atrial fibrillation. - He has chronic fatigue, which he reports is slightly improved from previous - Most recent labs from 06/28/2024 show hemoglobin of 12.4, platelets 141-stable chronic CKD creatinine 1.52.  Elevated B12 level 3012 and iron saturation is 13%.  Ferritin 138. -Given iron saturations have dropped and he is still anemic, would recommend 1 additional dose of IV Feraheme .  He was agreeable. -Recommend starting iron tablets 325 mg ferrous sulfate every other day along with vitamin C. -Return to clinic in 3 months with labs a few days before.

## 2024-07-10 ENCOUNTER — Telehealth: Payer: Self-pay | Admitting: Podiatry

## 2024-07-10 ENCOUNTER — Encounter (HOSPITAL_COMMUNITY): Payer: Self-pay | Admitting: Hematology

## 2024-07-10 NOTE — Telephone Encounter (Signed)
 Patient called in requesting a referral to be sent to [facility] for diabetic shoes.

## 2024-07-11 NOTE — Telephone Encounter (Signed)
 Referral sent to [name].

## 2024-07-13 ENCOUNTER — Inpatient Hospital Stay

## 2024-07-13 VITALS — BP 121/69 | HR 67 | Temp 97.0°F | Resp 20

## 2024-07-13 DIAGNOSIS — D649 Anemia, unspecified: Secondary | ICD-10-CM

## 2024-07-13 DIAGNOSIS — N1831 Chronic kidney disease, stage 3a: Secondary | ICD-10-CM | POA: Diagnosis not present

## 2024-07-13 MED ORDER — FERUMOXYTOL INJECTION 510 MG/17 ML
510.0000 mg | Freq: Once | INTRAVENOUS | Status: AC
  Administered 2024-07-13: 13:00:00 510 mg via INTRAVENOUS
  Filled 2024-07-13: qty 510

## 2024-07-13 NOTE — Progress Notes (Signed)
 Patient presents today for iron infusion. Patient is in satisfactory condition with no new complaints voiced.  Vital signs are stable.  We will proceed with infusion per provider orders.    Peripheral IV started with good blood return pre and post infusion.  Treatment given today per MD orders. Tolerated infusion without adverse affects. Vital signs stable. No complaints at this time. Discharged from clinic via wheelchair in stable condition. Alert and oriented x 3. F/U with [facility] as scheduled.

## 2024-07-13 NOTE — Progress Notes (Signed)
 Patient presents for Feraheme  infusion. MAR reviewed and updated. Patient denies any side effects related to last treatment.

## 2024-07-13 NOTE — Patient Instructions (Signed)

## 2024-07-23 ENCOUNTER — Telehealth: Payer: Self-pay | Admitting: Family Medicine

## 2024-07-23 NOTE — Telephone Encounter (Signed)
 Nurses Patient sent me a message He relates that he is having difficult swallowing relating a feeling of constriction in his throat he would like to see ENT-previously was referred to Dr. Carlie with [facility] on [address].  Please go ahead with referral as requested by patient  Please send him a MyChart message letting him know that this was put into the system and they should reach out to him thank you

## 2024-07-24 ENCOUNTER — Encounter: Payer: Self-pay | Admitting: *Deleted

## 2024-07-26 ENCOUNTER — Other Ambulatory Visit: Payer: Self-pay

## 2024-07-26 ENCOUNTER — Other Ambulatory Visit: Payer: Self-pay | Admitting: Family Medicine

## 2024-07-26 DIAGNOSIS — R1310 Dysphagia, unspecified: Secondary | ICD-10-CM

## 2024-07-26 NOTE — Telephone Encounter (Signed)
 Referral has been placed and patient has been notified through [employer]

## 2024-07-27 ENCOUNTER — Encounter: Payer: Self-pay | Admitting: Oncology

## 2024-07-29 ENCOUNTER — Other Ambulatory Visit: Payer: Self-pay | Admitting: Cardiology

## 2024-07-29 DIAGNOSIS — I4891 Unspecified atrial fibrillation: Secondary | ICD-10-CM

## 2024-07-31 NOTE — Telephone Encounter (Signed)
 Pt last saw Brittany Strader, [place] on 02/18/24, last labs 06/28/24 Creat 1.52, age 89, weight 97.5kg, based on specified criteria pt is on appropriate dosage of Eliquis  2.5mg  BID for afib.  Will refill rx.

## 2024-08-03 ENCOUNTER — Ambulatory Visit: Admitting: Family Medicine

## 2024-08-03 VITALS — BP 111/65 | HR 94 | Temp 97.2°F | Ht 72.0 in | Wt 197.1 lb

## 2024-08-03 DIAGNOSIS — E1122 Type 2 diabetes mellitus with diabetic chronic kidney disease: Secondary | ICD-10-CM

## 2024-08-03 DIAGNOSIS — Z79899 Other long term (current) drug therapy: Secondary | ICD-10-CM

## 2024-08-03 DIAGNOSIS — I10 Essential (primary) hypertension: Secondary | ICD-10-CM

## 2024-08-03 DIAGNOSIS — D649 Anemia, unspecified: Secondary | ICD-10-CM

## 2024-08-03 DIAGNOSIS — E785 Hyperlipidemia, unspecified: Secondary | ICD-10-CM | POA: Diagnosis not present

## 2024-08-03 DIAGNOSIS — E1169 Type 2 diabetes mellitus with other specified complication: Secondary | ICD-10-CM

## 2024-08-03 DIAGNOSIS — Z794 Long term (current) use of insulin: Secondary | ICD-10-CM

## 2024-08-03 DIAGNOSIS — N1832 Chronic kidney disease, stage 3b: Secondary | ICD-10-CM

## 2024-08-03 NOTE — Progress Notes (Signed)
" ° °  Subjective:    Patient ID: Jerry Burns, male    DOB: Apr 07, 1934, 89 y.o.   MRN: [id number]  HPI Patient is in room 10  Patient stated he had a diabetic collapse and partial recovery  Patient with significant stress His wife is in the hospital with severe disease Patient getting older Does not want to move would like to stay where he is the rest of his life Patient states his sugars have been running high lately and he decided to be on 4 shots of short acting insulin  a day 3 times with eating and 1 time without but then that causes and then vary his long-acting insulin  in the evening After reading over his readings we did discuss how it would be wise for him to try to just stick with the short acting at meals and then the long-acting toward the evening Patient states his feet are doing better He states his breathing is doing well Orthopedically he feels he is holding up  Patient had ulcers and stated he is doing better    Review of Systems     Objective:   Physical Exam  General-in no acute distress Eyes-no discharge Lungs-respiratory rate normal, CTA CV-no murmurs,RRR Extremities skin warm dry no edema Neuro grossly normal Behavior normal, alert  Patient does have a healing ulcer on his foot He also has bilateral deformities in the feet including hammertoes and bunions.  Patient at significant risk of future ulcers     Assessment & Plan:  1. Hyperlipidemia associated with type 2 diabetes mellitus (HCC) (Primary) Important continue his medication healthy diet check labs by spring time-May early June  2. Type 2 diabetes mellitus with stage 3b chronic kidney disease, with long-term current use of insulin  (HCC) Will check A1c now then repeated again in several months As for the short acting insulin  only at mealtimes Long-acting insulin  I recommend 22 units each evening Patient to send us  update in 7 to 10 days Adjust accordingly  - Hemoglobin A1c  3. Essential  hypertension Blood pressure good control continue current measures - Basic metabolic panel with GFR  4. High risk medication use Previous liver function have looked good  5. Normocytic anemia Patient relates a lot of fatigue tiredness history of anemia check CBC - CBC  Patient does have foot deformity with previous ulcer which is now healing under the care of podiatry would benefit from diabetic shoes  Follow-up about 5 months "

## 2024-08-03 NOTE — Progress Notes (Signed)
" ° ° °                  °  Subjective:     Objective:    BP 111/65 (BP Location: Left Arm, Patient Position: Sitting)   Pulse 94   Temp (!) 97.2 F (36.2 C)   Ht 6' (1.829 m)   Wt 197 lb 2 oz (89.4 kg)   SpO2 98%   BMI 26.73 kg/m      "

## 2024-08-04 ENCOUNTER — Ambulatory Visit: Payer: Self-pay | Admitting: Family Medicine

## 2024-08-04 LAB — BASIC METABOLIC PANEL WITH GFR
BUN/Creatinine Ratio: 30 — ABNORMAL HIGH (ref 10–24)
BUN: 47 mg/dL — ABNORMAL HIGH (ref 8–27)
CO2: 23 mmol/L (ref 20–29)
Calcium: 10.2 mg/dL (ref 8.6–10.2)
Chloride: 104 mmol/L (ref 96–106)
Creatinine, Ser: 1.56 mg/dL — ABNORMAL HIGH (ref 0.76–1.27)
Glucose: 69 mg/dL — ABNORMAL LOW (ref 70–99)
Potassium: 4.6 mmol/L (ref 3.5–5.2)
Sodium: 145 mmol/L — ABNORMAL HIGH (ref 134–144)
eGFR: 42 mL/min/1.73 — ABNORMAL LOW

## 2024-08-04 LAB — CBC
Hematocrit: 39.3 % (ref 37.5–51.0)
Hemoglobin: 13 g/dL (ref 13.0–17.7)
MCH: 32.7 pg (ref 26.6–33.0)
MCHC: 33.1 g/dL (ref 31.5–35.7)
MCV: 99 fL — ABNORMAL HIGH (ref 79–97)
Platelets: 141 x10E3/uL — ABNORMAL LOW (ref 150–450)
RBC: 3.97 x10E6/uL — ABNORMAL LOW (ref 4.14–5.80)
RDW: 12.7 % (ref 11.6–15.4)
WBC: 6.8 x10E3/uL (ref 3.4–10.8)

## 2024-08-04 LAB — HEMOGLOBIN A1C
Est. average glucose Bld gHb Est-mCnc: 160 mg/dL
Hgb A1c MFr Bld: 7.2 % — ABNORMAL HIGH (ref 4.8–5.6)

## 2024-08-04 NOTE — Progress Notes (Signed)
 Called pt and he verbalized his understanding Pt stated no further questions

## 2024-08-09 NOTE — Telephone Encounter (Signed)
 Nurses This is a sign that he can go up on his long-acting insulin  I would recommend going up to 26 units to try to get his morning sugars around the 100- 130 range  Also go ahead and put a consultation into clinical pharmacy to help him with continuous glucose monitoring thanks-Dr. Glendia  Please send patient the above message as well thanks-Dr. Glendia

## 2024-08-10 ENCOUNTER — Encounter: Payer: Self-pay | Admitting: Lab

## 2024-08-10 ENCOUNTER — Other Ambulatory Visit: Payer: Self-pay

## 2024-08-10 DIAGNOSIS — Z794 Long term (current) use of insulin: Secondary | ICD-10-CM

## 2024-08-10 NOTE — Telephone Encounter (Signed)
Consultation has been placed.

## 2024-08-11 ENCOUNTER — Telehealth: Payer: Self-pay

## 2024-08-11 NOTE — Progress Notes (Signed)
 Care Guide Pharmacy Note  08/11/2024 Name: Jerry Burns MRN: [id number] DOB: [birth year]  Referred By: Alphonsa Glendia LABOR, MD Reason for referral: Complex Care Management (Outreach to schedule with Pharm d )   Jerry Burns is a 89 y.o. year old male who is a primary care patient of Luking, Glendia LABOR, MD.  Jerry Burns was referred to the pharmacist for assistance related to: DMII  An unsuccessful telephone outreach was attempted today to contact the patient who was referred to the pharmacy team for assistance with medication assistance. Additional attempts will be made to contact the patient.  Jeoffrey Buffalo , RMA     [facility]  [facility], [facility] Guide  Direct Dial : [phone]  Website: [email or web address]

## 2024-08-11 NOTE — Progress Notes (Signed)
 Care Guide Pharmacy Note  08/11/2024 Name: Jerry Burns MRN: [id number] DOB: [birth year]  Referred By: Alphonsa Glendia LABOR, MD Reason for referral: Complex Care Management (Outreach to schedule with Pharm d )   Jerry Burns is a 89 y.o. year old male who is a primary care patient of Luking, Glendia LABOR, MD.  Jerry Burns was referred to the pharmacist for assistance related to: DMII  Successful contact was made with the patient to discuss pharmacy services including being ready for the pharmacist to call at least 5 minutes before the scheduled appointment time and to have medication bottles and any blood pressure readings ready for review. The patient agreed to meet with the pharmacist via telephone visit on (date/time).08/21/2024  Jeoffrey Buffalo , RMA     [place] [facility]  [facility], [facility] Guide  Direct Dial : [phone]  Website: [email or web address]

## 2024-08-14 ENCOUNTER — Other Ambulatory Visit: Payer: Self-pay

## 2024-08-14 DIAGNOSIS — R1310 Dysphagia, unspecified: Secondary | ICD-10-CM

## 2024-08-14 NOTE — Telephone Encounter (Signed)
 Nurses-please have referral team resend the referral to Dr. Carlie ENT Patient has not heard  Also-please forward phone number for Dr. Carlie to the patient-they can call directly later this week and reference our referral that we had sent previously-thank you  Referral sent to [facility] D. Carlie, MD Address: [address] [place], [place] [address]   Phone: [phone]

## 2024-08-15 NOTE — Progress Notes (Unsigned)
 "  Cardiology Office Note    Date:  08/16/2024  ID:  Jerry Burns, DOB Jul 30, 1933, MRN [id number] Cardiologist: Alvan Carrier, MD Cardiology APP:  Johnson Laymon HERO, PA-C { : History of Present Illness:    Jerry Burns is a 89 y.o. male  with past medical history of CAD (s/p DES to proximal LAD and balloon angioplasty to distal LAD in 2016, patent stent by repeat cath in 01/2017 with mild residual CAD, NST in 05/2022 showing mild peri-infarct ischemia and medical management pursued), paroxysmal atrial fibrillation, chronic HFpEF, HTN, HLD, IDDM, Stage 3 CKD and prior CVA who presents to the office today for 27-month follow-up.  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 He denied any recent anginal symptoms and was continued on his current cardiac medications with Eliquis  2.5 mg twice daily, Coreg  3.125 mg twice daily, Atorvastatin  80 mg daily, Jardiance  10 mg daily, Imdur  15 mg daily and Torsemide  20 mg daily.  In talking with the patient today, he reports overall doing well since his last office visit. He did have diabetic foot ulcers but reports these have improved with wound treatment by his PCP.  His wife was recently in the hospital and is now at the [facility] for rehabilitation. He does visit her frequently. He denies any recent chest pain or progressive dyspnea on exertion. Does report having an occasional vibration sense along his chest but no specific palpitations with his atrial fibrillation. No recent orthopnea or PND. Does experience occasional lower extremity edema if he does not take his Torsemide .  Studies Reviewed:   EKG: EKG is not ordered today.  Event Monitor: 11/2021 14 day monitor Afib throughout the study with min HR 46, Max HR 119, Avg HR 73 Frequent ventricular ectopy as PVCs (8.9%). Rare couplets and triplets. Two runs of NSVT longest 4 beats No symptoms reported     Patch  Wear Time:  13 days and 16 hours (2023-04-24T14:06:58-0400 to 2023-05-08T06:35:47-0400)   2 Ventricular Tachycardia runs occurred, the run with the fastest interval lasting 4 beats with a max rate of 193 bpm, the longest lasting 4 beats with an avg rate of 125 bpm. Atrial Flutter occurred continuously (100% burden), ranging from 46-119 bpm  (avg of 73 bpm). Isolated VEs were frequent (8.9%, [id number]), VE Couplets were rare (<1.0%, 3002), and VE Triplets were rare (<1.0%, 275). Ventricular Bigeminy and Trigeminy were present.   NST: 05/2022   Findings are consistent with prior inferior myocardial infarction with mild peri-infarct ischemia.   No ST deviation was noted.   LV perfusion is abnormal. There is a moderate size moderate intensity inferior/inferoseptal basal to apical defect with mild reversibility.   Left ventricular function is abnormal. Nuclear stress EF: 48 %. The left ventricular ejection fraction is mildly decreased (45-54%). End diastolic cavity size is normal.   Low to intermediate risk study based on combination of mild peri-infarct ischemia and mild LV dysfunction.  Risk Assessment/Calculations:    CHA2DS2-VASc Score = 8   This indicates a 10.8% annual risk of stroke. The patient's score is based upon: CHF History: 1 HTN History: 1 Diabetes History: 1 Stroke History: 2 Vascular Disease History: 1 Age Score: 2 Gender Score: 0    Physical Exam:   VS:  BP 134/88   Pulse 72   Ht 5' 11 (1.803 m)   Wt 202 lb (91.6 kg)   SpO2 95%  BMI 28.17 kg/m    Wt Readings from Last 3 Encounters:  08/16/24 202 lb (91.6 kg)  08/03/24 197 lb 2 oz (89.4 kg)  07/05/24 215 lb (97.5 kg)     GEN: Pleasant, elderly male appearing in no acute distress NECK: No JVD; No carotid bruits CARDIAC: Irregularly irregular, no murmurs, rubs, gallops RESPIRATORY:  Clear to auscultation without rales, wheezing or rhonchi  ABDOMEN: Appears non-distended. No obvious abdominal  masses. EXTREMITIES: No clubbing or cyanosis. No pitting edema.  Distal pedal pulses are 2+ bilaterally.   Assessment and Plan:   1. Coronary artery disease involving native coronary artery of native heart without angina pectoris - He previously underwent stenting to the proximal-LAD and angioplasty of the distal-LAD in 2016 and had a patent stent by catheterization in 2018. NST in 2023 showed mild, peri-infarct ischemia and medical management was recommended. - He denies any recent anginal symptoms. Continue current medical therapy with Coreg  6.25 mg twice daily, Atorvastatin  80 mg daily and Imdur  15 mg daily (reports dizziness with higher doses). He is not on ASA given the need for anticoagulation.  2. Chronic heart failure with preserved ejection fraction (HCC) - Most recent echocardiogram in 2022 showed a preserved EF of 55 to 60% with normal RV function. He experiences occasional lower extremity edema but no recent respiratory issues. Continue Jardiance  10 mg daily and Torsemide  20 mg daily. He does report occasional dizziness and we reviewed that he could try cutting the Torsemide  tablets in half and taking 10 mg daily to see if that helps with symptoms.  3. Paroxysmal atrial fibrillation (HCC) - He reports occasional vibrations along his chest but no specific palpitations. Heart rate is well-controlled in the 70's during today's visit. Continue Coreg  6.25 mg twice daily for rate-control. - No reports of active bleeding. Remains on Eliquis  2.5 mg twice daily for anticoagulation which is the correct dose given his age at 89 years old and creatinine greater than 1.5. CBC earlier this month showed his hemoglobin was stable at 13.0 with platelets at 141 K.   4. Stage 3b chronic kidney disease (HCC) - His creatinine was stable at 1.56 when checked earlier this month which is close to his known baseline.  5. Essential hypertension - His blood pressure is at 134/88 during today's visit and  given prior issues with orthostatic dizziness, would allow for higher BP given this and his age. Continue current medical therapy for now with Coreg  6.25 mg twice daily, Imdur  30 mg daily and Torsemide  20 mg daily.  6. Hyperlipidemia LDL goal <70 - His LDL was well-controlled at 50 on most recent check in 10/2023. He does have follow-up labs scheduled in the next few months. Continue current medical therapy for now with Atorvastatin  80 mg daily.  Signed, Laymon CHRISTELLA Qua, PA-C   "

## 2024-08-16 ENCOUNTER — Ambulatory Visit: Attending: Student | Admitting: Student

## 2024-08-16 ENCOUNTER — Encounter: Payer: Self-pay | Admitting: Student

## 2024-08-16 VITALS — BP 134/88 | HR 72 | Ht 71.0 in | Wt 202.0 lb

## 2024-08-16 DIAGNOSIS — E785 Hyperlipidemia, unspecified: Secondary | ICD-10-CM | POA: Diagnosis not present

## 2024-08-16 DIAGNOSIS — I2510 Atherosclerotic heart disease of native coronary artery without angina pectoris: Secondary | ICD-10-CM | POA: Insufficient documentation

## 2024-08-16 DIAGNOSIS — I480 Paroxysmal atrial fibrillation: Secondary | ICD-10-CM | POA: Insufficient documentation

## 2024-08-16 DIAGNOSIS — I5032 Chronic diastolic (congestive) heart failure: Secondary | ICD-10-CM | POA: Insufficient documentation

## 2024-08-16 DIAGNOSIS — N1832 Chronic kidney disease, stage 3b: Secondary | ICD-10-CM | POA: Insufficient documentation

## 2024-08-16 DIAGNOSIS — I10 Essential (primary) hypertension: Secondary | ICD-10-CM | POA: Diagnosis not present

## 2024-08-16 MED ORDER — ISOSORBIDE MONONITRATE ER 30 MG PO TB24: 30.0000 mg | ORAL_TABLET | Freq: Every day | ORAL | 3 refills | Status: AC

## 2024-08-16 NOTE — Patient Instructions (Signed)
 Medication Instructions:   Continue current medications. You can try cutting your Torsemide  tablets in half and taking 20mg  daily to see if this helps with your dizziness.   *If you need a refill on your cardiac medications before your next appointment, please call your pharmacy*  Follow-Up: At [facility], you and your health needs are our priority.  As part of our continuing mission to provide you with exceptional heart care, our providers are all part of one team.  This team includes your primary Cardiologist (physician) and Advanced Practice Providers or APPs (Physician Assistants and Nurse Practitioners) who all work together to provide you with the care you need, when you need it.  Your next appointment:   6 month(s)  Provider:   You may see Alvan Carrier, MD or one of the following Advanced Practice Providers on your designated Care Team:   Laymon Qua, PA-C  Scotesia [place], [place] Olivia Pavy, [place]     We recommend signing up for the patient portal called MyChart.  Sign up information is provided on this After Visit Summary.  MyChart is used to connect with patients for Virtual Visits (Telemedicine).  Patients are able to view lab/test results, encounter notes, upcoming appointments, etc.  Non-urgent messages can be sent to your provider as well.   To learn more about what you can do with MyChart, go to [email or web address].   Other Instructions

## 2024-08-17 ENCOUNTER — Encounter: Payer: Self-pay | Admitting: Student

## 2024-08-21 ENCOUNTER — Other Ambulatory Visit

## 2024-08-21 DIAGNOSIS — E119 Type 2 diabetes mellitus without complications: Secondary | ICD-10-CM

## 2024-08-21 NOTE — Progress Notes (Unsigned)
 "  08/21/2024 Name: Jerry Burns MRN: [id number] DOB: 06-Feb-1934  Chief Complaint  Patient presents with   Diabetes    Jerry Burns is a 89 y.o. year old male who presented for a telephone visit.   They were referred to the pharmacist by their PCP for assistance in managing diabetes and medication access.    Subjective:  Patient   Care Team: Primary Care Provider: Alphonsa Glendia LABOR, MD {careteamprovider:[id number]}  Medication Access/Adherence  Current Pharmacy:  [facility] [place], [place] - [address] [place] [address] Phone: [phone] Fax: [phone]   Patient reports affordability concerns with their medications: No  Patient reports access/transportation concerns to their pharmacy: No  Patient reports adherence concerns with their medications:  No     Diabetes:  Current medications: Lantus , Humalog , Jardiance  Medications tried in the past: Invokana , glipizide   Current glucose readings: , Januvia   Patient {Actions; denies-reports:120008} hypoglycemic s/sx including ***dizziness, shakiness, sweating. Patient {Actions; denies-reports:120008} hyperglycemic symptoms including ***polyuria, polydipsia, polyphagia, nocturia, neuropathy, blurred vision.  Current meal patterns:  Discussed meal planning options and Plate method for healthy eating Avoid sugary drinks and desserts Incorporate balanced protein, non starchy veggies, 1 serving of carbohydrate with each meal Increase water  intake Increase physical activity as able  Macrovascular and Microvascular Risk Reduction:  Statin? yes (atorvastatin ); ACEi/ARB? yes (***) Last urinary albumin/creatinine ratio:  Lab Results  Component Value Date   MICRALBCREAT 142 (H) 03/28/2024   MICRALBCREAT 314 (H) 11/03/2023   MICRALBCREAT 248 (H) 10/12/2022   MICRALBCREAT 139 (H) 08/19/2021   MICRALBCREAT 77 (H) 10/22/2020   MICRALBCREAT 115 (H) 07/27/2019   MICRALBCREAT  206.7 (H) 03/29/2018   MICRALBCREAT 491.6 (H) 08/23/2017   MICRALBCREAT 338.5 (H) 08/10/2016   MICRALBCREAT 203.3 (H) 09/10/2015   MICRALBCREAT 7.4 05/01/2008   MICRALBCREAT 7.1 05/04/2007   Last eye exam:  Lab Results  Component Value Date   HMDIABEYEEXA No Retinopathy 06/26/2024   Last foot exam: 03/10/2022 Tobacco Use:  Tobacco Use: Medium Risk (08/17/2024)   Patient History    Smoking Tobacco Use: Former    Smokeless Tobacco Use: Never    Passive Exposure: Never     Objective:  Lab Results  Component Value Date   HGBA1C 7.2 (H) 08/03/2024    Lab Results  Component Value Date   CREATININE 1.56 (H) 08/03/2024   BUN 47 (H) 08/03/2024   NA 145 (H) 08/03/2024   K 4.6 08/03/2024   CL 104 08/03/2024   CO2 23 08/03/2024    Lab Results  Component Value Date   CHOL 117 11/03/2023   HDL 45 11/03/2023   LDLCALC 50 11/03/2023   TRIG 121 11/03/2023   CHOLHDL 2.6 11/03/2023    Medications Reviewed Today     Reviewed by Billee Mliss BIRCH, RPH-CPP (Pharmacist) on 08/21/24 at 0845  Med List Status: <None>   Medication Order Taking? Sig Documenting Provider Last Dose Status Informant  Ascorbic Acid (VITAMIN C ) 1000 MG tablet [id number] Yes Take 1,000 mg by mouth daily.  [provider]  Active Self           Med Note (BAILEY, KATHERINE E   Sun Nov 26, 2019  2:27 PM)    atorvastatin  (LIPITOR ) 80 MG tablet [id number] Yes TAKE (1) TABLET BY MOUTH ONCE DAILY. Alphonsa Glendia LABOR, MD  Active   augmented betamethasone dipropionate (DIPROLENE-AF) 0.05 % ointment [id number] Yes Apply topically 2 (two) times daily as needed. [provider]  Active  B-D UF III MINI PEN NEEDLES 31G X 5 MM MISC [id number] Yes SMARTSIG:1 Each SUB-Q Twice Daily [provider]  Active   BD PEN NEEDLE NANO 2ND GEN 32G X 4 MM MISC [id number] Yes USE 1 PEN NEEDLE FIVE TIMES DAILY [provider]  Active   carvedilol  (COREG ) 6.25 MG tablet [id number] Yes Take 6.25 mg by mouth 2  (two) times daily. [provider]  Active   COMBIGAN  0.2-0.5 % ophthalmic solution [id number] Yes Place 1 drop into the left eye 2 (two) times daily.  [provider]  Active Self           Med Note DELBERTA, AMBER C   Fri Feb 07, 2016  2:36 PM)    Cyanocobalamin  (B-12) 2500 MCG TABS [id number] Yes Take 1 tablet by mouth 2 (two) times a week. Three times a week on Monday Wednesday and Friday [provider]  Active Self  ELIQUIS  2.5 MG TABS tablet [id number] Yes TAKE (1) TABLET BY MOUTH TWICE DAILY. Alvan Dorn FALCON, MD  Active   erythromycin ophthalmic ointment [id number] Yes Place 1 application  into the left eye 3 (three) times daily as needed (infection). [provider]  Active Self  Flaxseed, Linseed, (FLAXSEED OIL PO) [id number] Yes Take 1 capsule by mouth daily.  [provider]  Active Self  glucose blood (CONTOUR NEXT TEST) test strip [id number] Yes USE 1 STRIP TO CHECK GLUCOSE THREE TIMES DAILY Luking, Glendia LABOR, MD  Active   HUMALOG  KWIKPEN 100 UNIT/ML KwikPen [id number] Yes INJECT TWICE DAILY PER SLIDING SCALE FOLLOWING BREAKFAST AND DINNER: <85=0UNITS, 85-110=3 UNITS, 111-160 = 5, 161-220 = 7, >221 = 9 UNITS Luking, Scott A, MD  Active   isosorbide  mononitrate (IMDUR ) 30 MG 24 hr tablet [id number]  Take 1 tablet (30 mg total) by mouth daily. Johnson Laymon HERO, PA-C  Active   JARDIANCE  10 MG TABS tablet [id number] Yes Take 1 tablet by mouth daily before breakfast. Alphonsa Glendia LABOR, MD  Active   ketoconazole  (NIZORAL ) 2 % cream [id number] Yes Apply 1 Application topically 2 (two) times daily. Alphonsa Glendia LABOR, MD  Active   Lactobacillus (PROBIOTIC ACIDOPHILUS) CAPS [id number] Yes Take 1 capsule by mouth daily at 6 (six) AM. [provider]  Active Self  LANTUS  SOLOSTAR 100 UNIT/ML Solostar Pen [id number] Yes INJECT 24 TO 25 UNITS SUBCUTANEOUSLY ONCE DAILY AT 10 AM Luking, Glendia LABOR, MD  Active   Multiple Vitamin (MULTIVITAMIN) tablet [id number] Yes  Take 1 tablet by mouth daily. [provider]  Active Self  mupirocin  ointment (BACTROBAN ) 2 % [id number] Yes Apply 1 Application topically 2 (two) times daily. Christine Rush, DPM  Active   mupirocin  ointment (BACTROBAN ) 2 % [id number] Yes Apply 1 Application topically 2 (two) times daily. Christine Rush, DPM  Active   nitroGLYCERIN  (NITROSTAT ) 0.4 MG SL tablet [id number] Yes PLACE 1 TAB UNDER TONGUE EVERY 5 MIN IF NEEDED FOR CHEST PAIN. MAY USE 3 TIMES.NO RELIEF CALL 911. Alvan Dorn FALCON, MD  Active   polyethylene glycol powder (MIRALAX ) 17 GM/SCOOP powder [id number] Yes Start by taking 1/2 capful mixed in 4 ounces of fluid daily. You can increase to 1 capful daily if needed to maintain easy BMs. Ezzard Sonny RAMAN, PA-C  Active   Polyvinyl Alcohol-Povidone (REFRESH OP) [id number] Yes Place 1 drop into both ears 2 (two) times daily as needed (dryness). [provider]  Active Self  prednisoLONE acetate (PRED FORTE) 1 % ophthalmic suspension [id number] Yes a 1 drop in  the left eye three times daily x7 days, then twice daily x7 days then stop [provider]  Active   tamsulosin  (FLOMAX ) 0.4 MG CAPS capsule [id number] Yes Take 2 capsules by mouth at bedtime. if this causes drowsiness or dizziness to reduce it to just 1 capsule per day. Alphonsa Glendia LABOR, MD  Active   torsemide  (DEMADEX ) 20 MG tablet [id number] Yes Take 1 tablet (20 mg total) by mouth daily. Strader, Brittany M, PA-C  Active   Turmeric 500 MG CAPS [id number] Yes Take 500 mg by mouth daily. [provider]  Active Self  VELTASSA 8.4 g packet [id number] Yes Take 1 packet by mouth daily. [provider]  Active   vitamin C  (ASCORBIC ACID) 250 MG tablet [id number] Yes Take 1 tablet (250 mg total) by mouth every other day. Geofm Delon BRAVO, NP  Active               Assessment/Plan:   {Pharmacy A/P Choices:26421}  Follow Up Plan: ***  ***  "

## 2024-08-24 ENCOUNTER — Other Ambulatory Visit: Payer: Self-pay

## 2024-08-24 ENCOUNTER — Telehealth: Payer: Self-pay | Admitting: Family Medicine

## 2024-08-24 DIAGNOSIS — Z794 Long term (current) use of insulin: Secondary | ICD-10-CM

## 2024-08-24 MED ORDER — HUMALOG KWIKPEN 100 UNIT/ML ~~LOC~~ SOPN: PEN_INJECTOR | SUBCUTANEOUS | 1 refills | Status: AC

## 2024-08-24 MED ORDER — LANTUS SOLOSTAR 100 UNIT/ML ~~LOC~~ SOPN: PEN_INJECTOR | SUBCUTANEOUS | 3 refills | Status: AC

## 2024-08-24 NOTE — Telephone Encounter (Signed)
 Refill  HUMALOG  KWIKPEN 100 UNIT/ML [employer]

## 2024-08-24 NOTE — Telephone Encounter (Signed)
 Refill has been sent in.

## 2024-08-24 NOTE — Telephone Encounter (Signed)
 Refill  LANTUS  SOLOSTAR 100 UNIT/ML Solostar Pen   [employer]

## 2024-08-31 ENCOUNTER — Other Ambulatory Visit: Payer: Self-pay

## 2024-08-31 ENCOUNTER — Telehealth: Payer: Self-pay | Admitting: Family Medicine

## 2024-08-31 MED ORDER — ATORVASTATIN CALCIUM 80 MG PO TABS: ORAL_TABLET | ORAL | 1 refills | Status: AC

## 2024-08-31 NOTE — Telephone Encounter (Signed)
 Refill  atorvastatin  (LIPITOR ) 80 MG tablet   [facility]

## 2024-09-13 ENCOUNTER — Ambulatory Visit: Admitting: Podiatry

## 2024-10-03 ENCOUNTER — Inpatient Hospital Stay: Attending: Oncology

## 2024-10-10 ENCOUNTER — Inpatient Hospital Stay: Admitting: Physician Assistant

## 2024-12-06 ENCOUNTER — Ambulatory Visit: Admitting: Family Medicine
